# Patient Record
Sex: Male | Born: 1935 | Race: White | Hispanic: No | Marital: Married | Smoking: Former smoker
Health system: Southern US, Community
[De-identification: ages and names within clinical notes are randomized; demographics above are authoritative.]

## PROBLEM LIST (undated history)

## (undated) DIAGNOSIS — E039 Hypothyroidism, unspecified: Secondary | ICD-10-CM

## (undated) DIAGNOSIS — F319 Bipolar disorder, unspecified: Secondary | ICD-10-CM

## (undated) DIAGNOSIS — K824 Cholesterolosis of gallbladder: Secondary | ICD-10-CM

## (undated) DIAGNOSIS — I10 Essential (primary) hypertension: Secondary | ICD-10-CM

## (undated) DIAGNOSIS — D099 Carcinoma in situ, unspecified: Secondary | ICD-10-CM

## (undated) DIAGNOSIS — I2510 Atherosclerotic heart disease of native coronary artery without angina pectoris: Secondary | ICD-10-CM

## (undated) DIAGNOSIS — D0461 Carcinoma in situ of skin of right upper limb, including shoulder: Secondary | ICD-10-CM

## (undated) DIAGNOSIS — K625 Hemorrhage of anus and rectum: Secondary | ICD-10-CM

## (undated) DIAGNOSIS — N281 Cyst of kidney, acquired: Secondary | ICD-10-CM

## (undated) DIAGNOSIS — M47816 Spondylosis without myelopathy or radiculopathy, lumbar region: Secondary | ICD-10-CM

## (undated) DIAGNOSIS — G2581 Restless legs syndrome: Secondary | ICD-10-CM

## (undated) DIAGNOSIS — Z940 Kidney transplant status: Secondary | ICD-10-CM

## (undated) DIAGNOSIS — N400 Enlarged prostate without lower urinary tract symptoms: Secondary | ICD-10-CM

## (undated) DIAGNOSIS — N183 Chronic kidney disease, stage 3 unspecified: Secondary | ICD-10-CM

## (undated) DIAGNOSIS — E785 Hyperlipidemia, unspecified: Secondary | ICD-10-CM

## (undated) DIAGNOSIS — I639 Cerebral infarction, unspecified: Secondary | ICD-10-CM

## (undated) DIAGNOSIS — R1310 Dysphagia, unspecified: Secondary | ICD-10-CM

## (undated) DIAGNOSIS — L219 Seborrheic dermatitis, unspecified: Secondary | ICD-10-CM

## (undated) DIAGNOSIS — R972 Elevated prostate specific antigen [PSA]: Secondary | ICD-10-CM

## (undated) DIAGNOSIS — D649 Anemia, unspecified: Secondary | ICD-10-CM

## (undated) DIAGNOSIS — Z86010 Personal history of colonic polyps: Secondary | ICD-10-CM

## (undated) DIAGNOSIS — K219 Gastro-esophageal reflux disease without esophagitis: Secondary | ICD-10-CM

## (undated) DIAGNOSIS — L908 Other atrophic disorders of skin: Secondary | ICD-10-CM

## (undated) DIAGNOSIS — H02409 Unspecified ptosis of unspecified eyelid: Secondary | ICD-10-CM

## (undated) DIAGNOSIS — M1990 Unspecified osteoarthritis, unspecified site: Secondary | ICD-10-CM

## (undated) DIAGNOSIS — J849 Interstitial pulmonary disease, unspecified: Secondary | ICD-10-CM

## (undated) DIAGNOSIS — C4491 Basal cell carcinoma of skin, unspecified: Secondary | ICD-10-CM

## (undated) HISTORY — DX: Chronic kidney disease, stage 3 (moderate): N18.3

## (undated) HISTORY — DX: Basal cell carcinoma of skin, unspecified: C44.91

## (undated) HISTORY — DX: Benign prostatic hyperplasia without lower urinary tract symptoms: N40.0

## (undated) HISTORY — DX: Bipolar disorder, unspecified: F31.9

## (undated) HISTORY — DX: Elevated prostate specific antigen (PSA): R97.20

## (undated) HISTORY — DX: Gastro-esophageal reflux disease without esophagitis: K21.9

## (undated) HISTORY — DX: Atherosclerotic heart disease of native coronary artery without angina pectoris: I25.10

## (undated) HISTORY — DX: Hyperlipidemia, unspecified: E78.5

## (undated) HISTORY — DX: Anemia, unspecified: D64.9

## (undated) HISTORY — DX: Kidney transplant status: Z94.0

## (undated) HISTORY — DX: Essential (primary) hypertension: I10

## (undated) HISTORY — DX: Dysphagia, unspecified: R13.10

## (undated) HISTORY — DX: Unspecified ptosis of unspecified eyelid: H02.409

## (undated) HISTORY — DX: Chronic kidney disease, stage 3 unspecified: N18.30

## (undated) HISTORY — DX: Cyst of kidney, acquired: N28.1

## (undated) HISTORY — DX: Cerebral infarction, unspecified: I63.9

## (undated) HISTORY — DX: Restless legs syndrome: G25.81

## (undated) HISTORY — DX: Cholesterolosis of gallbladder: K82.4

## (undated) HISTORY — PX: OTHER SURGICAL HISTORY: SHX169

## (undated) HISTORY — DX: Personal history of colonic polyps: Z86.010

## (undated) HISTORY — PX: COLONOSCOPY: SHX174

## (undated) HISTORY — DX: Spondylosis without myelopathy or radiculopathy, lumbar region: M47.816

## (undated) HISTORY — DX: Seborrheic dermatitis, unspecified: L21.9

## (undated) HISTORY — DX: Hemorrhage of anus and rectum: K62.5

---
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
## 2003-12-29 LAB — HM COLONOSCOPY: HM Colonoscopy: NORMAL

## 2004-11-03 ENCOUNTER — Ambulatory Visit: Payer: Self-pay | Admitting: Internal Medicine

## 2004-11-11 ENCOUNTER — Ambulatory Visit: Payer: Self-pay | Admitting: Internal Medicine

## 2005-02-03 ENCOUNTER — Ambulatory Visit: Payer: Self-pay | Admitting: Internal Medicine

## 2005-05-04 ENCOUNTER — Ambulatory Visit: Payer: Self-pay | Admitting: Internal Medicine

## 2005-05-12 ENCOUNTER — Ambulatory Visit (HOSPITAL_COMMUNITY): Admission: RE | Admit: 2005-05-12 | Discharge: 2005-05-12 | Payer: Self-pay | Admitting: Internal Medicine

## 2005-08-07 ENCOUNTER — Ambulatory Visit: Payer: Self-pay | Admitting: Internal Medicine

## 2005-08-12 ENCOUNTER — Ambulatory Visit: Payer: Self-pay | Admitting: Internal Medicine

## 2005-11-18 ENCOUNTER — Ambulatory Visit: Payer: Self-pay | Admitting: Internal Medicine

## 2006-02-03 ENCOUNTER — Ambulatory Visit: Payer: Self-pay | Admitting: Internal Medicine

## 2006-02-09 ENCOUNTER — Ambulatory Visit: Payer: Self-pay | Admitting: Internal Medicine

## 2006-02-24 ENCOUNTER — Ambulatory Visit: Payer: Self-pay | Admitting: Internal Medicine

## 2006-04-14 ENCOUNTER — Ambulatory Visit: Payer: Self-pay | Admitting: Internal Medicine

## 2006-06-25 ENCOUNTER — Ambulatory Visit: Payer: Self-pay | Admitting: Internal Medicine

## 2006-09-23 ENCOUNTER — Ambulatory Visit: Payer: Self-pay | Admitting: Internal Medicine

## 2006-12-15 ENCOUNTER — Ambulatory Visit: Payer: Self-pay | Admitting: Internal Medicine

## 2006-12-15 LAB — CONVERTED CEMR LAB
ALT: 38 units/L (ref 0–40)
AST: 26 units/L (ref 0–37)
Albumin: 3.6 g/dL (ref 3.5–5.2)
Alkaline Phosphatase: 95 units/L (ref 39–117)
BUN: 21 mg/dL (ref 6–23)
Bilirubin, Direct: 0.1 mg/dL (ref 0.0–0.3)
CO2: 25 meq/L (ref 19–32)
Calcium: 9.5 mg/dL (ref 8.4–10.5)
Chloride: 110 meq/L (ref 96–112)
Chol/HDL Ratio, serum: 4.4
Cholesterol: 162 mg/dL (ref 0–200)
Creatinine, Ser: 2.3 mg/dL — ABNORMAL HIGH (ref 0.4–1.5)
GFR calc non Af Amer: 30 mL/min
Glomerular Filtration Rate, Af Am: 37 mL/min/{1.73_m2}
Glucose, Bld: 103 mg/dL — ABNORMAL HIGH (ref 70–99)
HDL: 37.1 mg/dL — ABNORMAL LOW (ref >39.0)
LDL Cholesterol: 99 mg/dL (ref 0–99)
Potassium: 4.3 meq/L (ref 3.5–5.1)
Sodium: 142 meq/L (ref 135–145)
Total Bilirubin: 0.7 mg/dL (ref 0.3–1.2)
Total Protein: 7 g/dL (ref 6.0–8.3)
Triglyceride fasting, serum: 130 mg/dL (ref 0–149)
VLDL: 26 mg/dL (ref 0–40)

## 2006-12-23 ENCOUNTER — Ambulatory Visit: Payer: Self-pay | Admitting: Internal Medicine

## 2007-04-14 ENCOUNTER — Ambulatory Visit: Payer: Self-pay | Admitting: Internal Medicine

## 2007-04-14 LAB — CONVERTED CEMR LAB
ALT: 19 units/L (ref 0–40)
AST: 18 units/L (ref 0–37)
Albumin: 3.8 g/dL (ref 3.5–5.2)
Alkaline Phosphatase: 102 units/L (ref 39–117)
BUN: 22 mg/dL (ref 6–23)
Bilirubin, Direct: 0.1 mg/dL (ref 0.0–0.3)
CO2: 29 meq/L (ref 19–32)
Calcium: 9.7 mg/dL (ref 8.4–10.5)
Chloride: 108 meq/L (ref 96–112)
Cholesterol: 135 mg/dL (ref 0–200)
Creatinine, Ser: 2.5 mg/dL — ABNORMAL HIGH (ref 0.4–1.5)
GFR calc Af Amer: 33 mL/min
GFR calc non Af Amer: 27 mL/min
Glucose, Bld: 86 mg/dL (ref 70–99)
HDL: 33.8 mg/dL — ABNORMAL LOW (ref >39.0)
LDL Cholesterol: 62 mg/dL (ref 0–99)
Potassium: 4.7 meq/L (ref 3.5–5.1)
Sodium: 144 meq/L (ref 135–145)
TSH: 0.46 microintl units/mL (ref 0.35–5.50)
Total Bilirubin: 0.7 mg/dL (ref 0.3–1.2)
Total CHOL/HDL Ratio: 4
Total Protein: 7.2 g/dL (ref 6.0–8.3)
Triglycerides: 196 mg/dL — ABNORMAL HIGH (ref 0–149)
VLDL: 39 mg/dL (ref 0–40)

## 2007-07-07 DIAGNOSIS — M109 Gout, unspecified: Secondary | ICD-10-CM | POA: Insufficient documentation

## 2007-07-07 DIAGNOSIS — K219 Gastro-esophageal reflux disease without esophagitis: Secondary | ICD-10-CM | POA: Insufficient documentation

## 2007-07-07 DIAGNOSIS — I10 Essential (primary) hypertension: Secondary | ICD-10-CM | POA: Insufficient documentation

## 2007-07-07 DIAGNOSIS — E785 Hyperlipidemia, unspecified: Secondary | ICD-10-CM | POA: Insufficient documentation

## 2007-07-27 DIAGNOSIS — F319 Bipolar disorder, unspecified: Secondary | ICD-10-CM | POA: Insufficient documentation

## 2007-08-12 ENCOUNTER — Ambulatory Visit: Payer: Self-pay | Admitting: Internal Medicine

## 2007-08-12 DIAGNOSIS — E039 Hypothyroidism, unspecified: Secondary | ICD-10-CM | POA: Insufficient documentation

## 2007-12-02 ENCOUNTER — Ambulatory Visit: Payer: Self-pay | Admitting: Internal Medicine

## 2007-12-04 LAB — CONVERTED CEMR LAB
BUN: 27 mg/dL — ABNORMAL HIGH (ref 6–23)
CO2: 30 meq/L (ref 19–32)
Calcium: 9.8 mg/dL (ref 8.4–10.5)
Chloride: 103 meq/L (ref 96–112)
Cholesterol: 162 mg/dL (ref 0–200)
Creatinine, Ser: 2.6 mg/dL — ABNORMAL HIGH (ref 0.4–1.5)
GFR calc Af Amer: 32 mL/min
GFR calc non Af Amer: 26 mL/min
Glucose, Bld: 89 mg/dL (ref 70–99)
HDL: 31.4 mg/dL — ABNORMAL LOW (ref >39.0)
LDL Cholesterol: 96 mg/dL (ref 0–99)
PSA: 4.83 ng/mL — ABNORMAL HIGH (ref 0.10–4.00)
Potassium: 3.7 meq/L (ref 3.5–5.1)
Sodium: 143 meq/L (ref 135–145)
TSH: 0.66 microintl units/mL (ref 0.35–5.50)
Total CHOL/HDL Ratio: 5.2
Triglycerides: 171 mg/dL — ABNORMAL HIGH (ref 0–149)
VLDL: 34 mg/dL (ref 0–40)

## 2007-12-09 ENCOUNTER — Ambulatory Visit: Payer: Self-pay | Admitting: Internal Medicine

## 2007-12-09 DIAGNOSIS — R972 Elevated prostate specific antigen [PSA]: Secondary | ICD-10-CM | POA: Insufficient documentation

## 2007-12-19 DIAGNOSIS — J45909 Unspecified asthma, uncomplicated: Secondary | ICD-10-CM | POA: Insufficient documentation

## 2007-12-27 ENCOUNTER — Ambulatory Visit: Payer: Self-pay | Admitting: Family Medicine

## 2007-12-28 ENCOUNTER — Encounter: Payer: Self-pay | Admitting: Internal Medicine

## 2008-01-19 ENCOUNTER — Telehealth (INDEPENDENT_AMBULATORY_CARE_PROVIDER_SITE_OTHER): Payer: Self-pay | Admitting: *Deleted

## 2008-01-19 ENCOUNTER — Encounter: Payer: Self-pay | Admitting: Internal Medicine

## 2008-02-01 ENCOUNTER — Encounter: Payer: Self-pay | Admitting: Internal Medicine

## 2008-03-28 ENCOUNTER — Ambulatory Visit: Payer: Self-pay | Admitting: Internal Medicine

## 2008-03-28 LAB — CONVERTED CEMR LAB
AST: 22 units/L (ref 0–37)
Alkaline Phosphatase: 94 units/L (ref 39–117)
Bilirubin, Direct: 0.2 mg/dL (ref 0.0–0.3)
Creatinine, Ser: 2.7 mg/dL — ABNORMAL HIGH (ref 0.4–1.5)
GFR calc non Af Amer: 25 mL/min
Glucose, Bld: 58 mg/dL — ABNORMAL LOW (ref 70–99)
LDL Cholesterol: 94 mg/dL (ref 0–99)
Sodium: 143 meq/L (ref 135–145)
Total Protein: 6.7 g/dL (ref 6.0–8.3)
Triglycerides: 147 mg/dL (ref 0–149)
VLDL: 29 mg/dL (ref 0–40)

## 2008-04-18 ENCOUNTER — Telehealth (INDEPENDENT_AMBULATORY_CARE_PROVIDER_SITE_OTHER): Payer: Self-pay | Admitting: *Deleted

## 2008-04-18 ENCOUNTER — Ambulatory Visit: Payer: Self-pay | Admitting: Internal Medicine

## 2008-04-18 DIAGNOSIS — N400 Enlarged prostate without lower urinary tract symptoms: Secondary | ICD-10-CM | POA: Insufficient documentation

## 2008-07-20 ENCOUNTER — Ambulatory Visit: Payer: Self-pay | Admitting: Internal Medicine

## 2008-07-20 LAB — CONVERTED CEMR LAB
ALT: 20 units/L (ref 0–53)
AST: 22 units/L (ref 0–37)
BUN: 36 mg/dL — ABNORMAL HIGH (ref 6–23)
Bilirubin, Direct: 0.1 mg/dL (ref 0.0–0.3)
CO2: 28 meq/L (ref 19–32)
Calcium: 9.3 mg/dL (ref 8.4–10.5)
Chloride: 103 meq/L (ref 96–112)
Cholesterol: 146 mg/dL (ref 0–200)
GFR calc Af Amer: 30 mL/min
HDL: 31.7 mg/dL — ABNORMAL LOW (ref >39.0)
Total CHOL/HDL Ratio: 4.6
Total Protein: 7.4 g/dL (ref 6.0–8.3)

## 2008-07-27 ENCOUNTER — Ambulatory Visit: Payer: Self-pay | Admitting: Internal Medicine

## 2008-08-22 ENCOUNTER — Encounter: Payer: Self-pay | Admitting: Internal Medicine

## 2008-12-24 ENCOUNTER — Ambulatory Visit: Payer: Self-pay | Admitting: Internal Medicine

## 2008-12-24 LAB — CONVERTED CEMR LAB
Albumin: 3.6 g/dL (ref 3.5–5.2)
Alkaline Phosphatase: 89 units/L (ref 39–117)
BUN: 32 mg/dL — ABNORMAL HIGH (ref 6–23)
CO2: 29 meq/L (ref 19–32)
Calcium: 9.3 mg/dL (ref 8.4–10.5)
Chloride: 108 meq/L (ref 96–112)
Cholesterol: 144 mg/dL (ref 0–200)
HDL: 35.3 mg/dL — ABNORMAL LOW (ref >39.0)
Hemoglobin: 15.2 g/dL (ref 13.0–17.0)
Lymphocytes Relative: 21.3 % (ref 12.0–46.0)
MCV: 87.8 fL (ref 78.0–100.0)
Monocytes Absolute: 0.7 10*3/uL (ref 0.1–1.0)
Monocytes Relative: 9.6 % (ref 3.0–12.0)
Neutrophils Relative %: 63 % (ref 43.0–77.0)
Potassium: 3.8 meq/L (ref 3.5–5.1)
TSH: 1.05 microintl units/mL (ref 0.35–5.50)
Total Bilirubin: 0.7 mg/dL (ref 0.3–1.2)
Total CHOL/HDL Ratio: 4.1
Total Protein: 6.8 g/dL (ref 6.0–8.3)

## 2008-12-31 ENCOUNTER — Ambulatory Visit: Payer: Self-pay | Admitting: Internal Medicine

## 2009-01-31 ENCOUNTER — Encounter: Payer: Self-pay | Admitting: Internal Medicine

## 2009-02-21 ENCOUNTER — Ambulatory Visit: Payer: Self-pay | Admitting: Internal Medicine

## 2009-03-25 ENCOUNTER — Ambulatory Visit: Payer: Self-pay | Admitting: Internal Medicine

## 2009-03-25 LAB — CONVERTED CEMR LAB
BUN: 32 mg/dL — ABNORMAL HIGH (ref 6–23)
CO2: 28 meq/L (ref 19–32)
Calcium: 9.2 mg/dL (ref 8.4–10.5)
GFR calc non Af Amer: 23.91 mL/min (ref >60)
Glucose, Bld: 86 mg/dL (ref 70–99)
Sodium: 146 meq/L — ABNORMAL HIGH (ref 135–145)

## 2009-04-05 ENCOUNTER — Ambulatory Visit: Payer: Self-pay | Admitting: Internal Medicine

## 2009-07-29 ENCOUNTER — Ambulatory Visit: Payer: Self-pay | Admitting: Internal Medicine

## 2009-07-29 LAB — CONVERTED CEMR LAB
ALT: 19 units/L (ref 0–53)
Alkaline Phosphatase: 87 units/L (ref 39–117)
Cholesterol: 138 mg/dL (ref 0–200)
GFR calc non Af Amer: 21.24 mL/min (ref >60)
Sodium: 147 meq/L — ABNORMAL HIGH (ref 135–145)
TSH: 0.73 microintl units/mL (ref 0.35–5.50)
Total CHOL/HDL Ratio: 4
Total Protein: 7.4 g/dL (ref 6.0–8.3)

## 2009-08-05 ENCOUNTER — Ambulatory Visit: Payer: Self-pay | Admitting: Internal Medicine

## 2009-09-05 ENCOUNTER — Encounter: Payer: Self-pay | Admitting: Internal Medicine

## 2009-12-09 ENCOUNTER — Ambulatory Visit: Payer: Self-pay | Admitting: Internal Medicine

## 2009-12-09 LAB — CONVERTED CEMR LAB
Albumin: 3.9 g/dL (ref 3.5–5.2)
CO2: 29 meq/L (ref 19–32)
Calcium: 9.4 mg/dL (ref 8.4–10.5)
Chloride: 107 meq/L (ref 96–112)
GFR calc non Af Amer: 22.03 mL/min (ref >60)
Potassium: 4.3 meq/L (ref 3.5–5.1)
Sodium: 144 meq/L (ref 135–145)
TSH: 0.95 microintl units/mL (ref 0.35–5.50)

## 2009-12-16 ENCOUNTER — Ambulatory Visit: Payer: Self-pay | Admitting: Internal Medicine

## 2009-12-28 HISTORY — PX: PROSTATE BIOPSY: SHX241

## 2010-02-06 ENCOUNTER — Encounter: Payer: Self-pay | Admitting: Internal Medicine

## 2010-04-13 ENCOUNTER — Emergency Department (HOSPITAL_COMMUNITY): Admission: EM | Admit: 2010-04-13 | Discharge: 2010-04-13 | Payer: Self-pay | Admitting: Family Medicine

## 2010-04-16 ENCOUNTER — Ambulatory Visit: Payer: Self-pay | Admitting: Internal Medicine

## 2010-04-23 ENCOUNTER — Ambulatory Visit: Payer: Self-pay | Admitting: Internal Medicine

## 2010-05-15 ENCOUNTER — Ambulatory Visit: Payer: Self-pay | Admitting: Internal Medicine

## 2010-05-15 LAB — CONVERTED CEMR LAB
ALT: 23 units/L (ref 0–53)
BUN: 32 mg/dL — ABNORMAL HIGH (ref 6–23)
Cholesterol: 170 mg/dL (ref 0–200)
Creatinine, Ser: 2.8 mg/dL — ABNORMAL HIGH (ref 0.4–1.5)
GFR calc non Af Amer: 23.44 mL/min (ref >60)
Glucose, Bld: 118 mg/dL — ABNORMAL HIGH (ref 70–99)
Potassium: 4.1 meq/L (ref 3.5–5.1)
Sodium: 142 meq/L (ref 135–145)
Total CHOL/HDL Ratio: 4
Triglycerides: 148 mg/dL (ref 0.0–149.0)
Uric Acid, Serum: 6.6 mg/dL (ref 4.0–7.8)

## 2010-05-22 ENCOUNTER — Ambulatory Visit: Payer: Self-pay | Admitting: Internal Medicine

## 2010-08-06 ENCOUNTER — Encounter: Payer: Self-pay | Admitting: Internal Medicine

## 2010-09-12 ENCOUNTER — Ambulatory Visit: Payer: Self-pay | Admitting: Internal Medicine

## 2010-09-12 LAB — CONVERTED CEMR LAB
ALT: 19 units/L (ref 0–53)
Basophils Absolute: 0.1 10*3/uL (ref 0.0–0.1)
Basophils Relative: 1 % (ref 0.0–3.0)
Calcium: 9 mg/dL (ref 8.4–10.5)
Eosinophils Absolute: 0.2 10*3/uL (ref 0.0–0.7)
Eosinophils Relative: 2.8 % (ref 0.0–5.0)
GFR calc non Af Amer: 22.24 mL/min (ref >60)
Glucose, Bld: 102 mg/dL — ABNORMAL HIGH (ref 70–99)
HCT: 42.8 % (ref 39.0–52.0)
HDL: 34.7 mg/dL — ABNORMAL LOW (ref >39.00)
Lymphs Abs: 1.8 10*3/uL (ref 0.7–4.0)
MCHC: 33.7 g/dL (ref 30.0–36.0)
MCV: 89 fL (ref 78.0–100.0)
Monocytes Absolute: 0.7 10*3/uL (ref 0.1–1.0)
Monocytes Relative: 10 % (ref 3.0–12.0)
Platelets: 231 10*3/uL (ref 150.0–400.0)
RBC: 4.81 M/uL (ref 4.22–5.81)
Total Bilirubin: 0.4 mg/dL (ref 0.3–1.2)
Triglycerides: 112 mg/dL (ref 0.0–149.0)

## 2010-09-18 ENCOUNTER — Ambulatory Visit: Payer: Self-pay | Admitting: Internal Medicine

## 2010-12-05 ENCOUNTER — Ambulatory Visit: Payer: Self-pay | Admitting: Internal Medicine

## 2010-12-05 LAB — CONVERTED CEMR LAB
CO2: 27 meq/L (ref 19–32)
Cholesterol: 161 mg/dL (ref 0–200)
Creatinine, Ser: 3.1 mg/dL — ABNORMAL HIGH (ref 0.4–1.5)
GFR calc non Af Amer: 21.24 mL/min — ABNORMAL LOW (ref >60.00)
LDL Cholesterol: 98 mg/dL (ref 0–99)
Phosphorus: 4.7 mg/dL — ABNORMAL HIGH (ref 2.3–4.6)
Sodium: 144 meq/L (ref 135–145)
Total Bilirubin: 0.5 mg/dL (ref 0.3–1.2)
Total CHOL/HDL Ratio: 5

## 2010-12-06 ENCOUNTER — Encounter: Payer: Self-pay | Admitting: Internal Medicine

## 2010-12-06 LAB — CONVERTED CEMR LAB: Calcium, Total (PTH): 9.5 mg/dL (ref 8.4–10.5)

## 2010-12-12 ENCOUNTER — Ambulatory Visit: Payer: Self-pay | Admitting: Internal Medicine

## 2011-01-18 ENCOUNTER — Encounter: Payer: Self-pay | Admitting: Internal Medicine

## 2011-01-29 NOTE — Assessment & Plan Note (Signed)
Summary: 4 month fup//ccm   Vital Signs:  Patient profile:   75 year old male Weight:      207 pounds Temp:     98.2 degrees F oral Pulse rate:   80 / minute BP sitting:   120 / 80  (left arm) Cuff size:   regular  Vitals Entered By: Sherron Monday, CMA (AAMA) (September 18, 2010 8:35 AM) CC: Follow-up visit    CC:  Follow-up visit .  History of Present Illness:  Follow-Up Visit      This is a 75 year old man who presents for Follow-up visit.  The patient denies chest pain and palpitations.  Since the last visit the patient notes no new problems or concerns.  The patient reports taking meds as prescribed.  When questioned about possible medication side effects, the patient notes none.   plays tennis regularly He feels great on lithium  All other systems reviewed and were negative   Current Medications (verified): 1)  Allopurinol 100 Mg Tabs (Allopurinol) .... Take 1 Tablet By Mouth Once A Day 2)  Colchicine 0.6 Mg Tabs (Colchicine) .... Take 1 Tablet By Mouth Four Times A Day Prn 3)  Levothyroxine Sodium 25 Mcg Tabs (Levothyroxine Sodium) .... Take 1 Tablet By Mouth Once A Day 4)  Omeprazole 20 Mg Cpdr (Omeprazole) .... Take 1 Capsule By Mouth Once A Day 5)  Simvastatin 40 Mg Tabs (Simvastatin) .... Take 1/2 Tablet By Mouth At Bedtime 6)  Zyprexa 10 Mg Tabs (Olanzapine) .... Take 1/2 Tablet By Mouth At Bedtime 7)  Norvasc 10 Mg  Tabs (Amlodipine Besylate) .... One By Mouth Daiy 8)  Furosemide 40 Mg  Tabs (Furosemide) .... One By Mouth Daily 9)  Calcitriol 0.25 Mcg  Caps (Calcitriol) .... As Directed 10)  Colace 100 Mg Caps (Docusate Sodium) .... Once Daily 11)  Flax Seed Oil 1000 Mg Caps (Flaxseed (Linseed)) .... Once Daily  Allergies (verified): 1)  ! Amantadine Hcl (Amantadine Hcl) 2)  Thorazine  Past History:  Past Medical History: Last updated: 04/18/2008 GERD Hyperlipidemia Hypertension Renal insufficiency Gout bipolar [facility]" normal by pt  report  2007 Hypothyroidism Benign prostatic hypertrophy--prostate bxs-normal  Past Surgical History: Last updated: 07/07/2007 dilation for GERD  Social History: Last updated: 08/12/2007 Married Former Smoker Regular exercise-no  Risk Factors: Exercise: no (08/12/2007)  Risk Factors: Smoking Status: current (05/22/2010)  Physical Exam  General:  alert and well-developed.   Head:  normocephalic and atraumatic.   Eyes:  pupils equal and pupils round.   Neck:  No deformities, masses, or tenderness noted. Chest Wall:  No deformities, masses, tenderness or gynecomastia noted. Lungs:  ctanormal respiratory effort, no intercostal retractions, no accessory muscle use, and no wheezes.   Abdomen:  Bowel sounds positive,abdomen soft and non-tender without masses, organomegaly or hernias noted. Neurologic:  cranial nerves II-XII intact and gait normal.  no tremor   Impression & Recommendations:  Problem # 1:  RENAL DISEASE, CHRONIC, STAGE II (ICD-585.2)  reviewed labs faxed labs to nephrology  Labs Reviewed: BUN: 34 (09/12/2010)   Cr: 3.0 (09/12/2010)    Hgb: 14.4 (09/12/2010)   Hct: 42.8 (09/12/2010)   Ca++: 9.0 (09/12/2010)    TP: 6.4 (09/12/2010)   Alb: 3.9 (09/12/2010)  Problem # 2:  HYPOTHYROIDISM (ICD-244.9) controlled previously will need f/u His updated medication list for this problem includes:    Levothyroxine Sodium 25 Mcg Tabs (Levothyroxine sodium) .Marland Kitchen... Take 1 tablet by mouth once a day  Labs  Reviewed: TSH: 0.95 (12/09/2009)    Chol: 140 (09/12/2010)   HDL: 34.70 (09/12/2010)   LDL: 83 (09/12/2010)   TG: 112.0 (09/12/2010)  Problem # 3:  DISORDER, BIPOLAR NOS (ICD-296.80) doing great on zyprexa  Problem # 4:  HYPERTENSION (ICD-401.9) controlled continue current medications  His updated medication list for this problem includes:    Norvasc 10 Mg Tabs (Amlodipine besylate) ..... One by mouth daiy    Furosemide 40 Mg Tabs (Furosemide) ..... One by mouth  daily  BP today: 120/80 Prior BP: 118/80 (05/22/2010)  Labs Reviewed: K+: 3.7 (09/12/2010) Creat: : 3.0 (09/12/2010)   Chol: 140 (09/12/2010)   HDL: 34.70 (09/12/2010)   LDL: 83 (09/12/2010)   TG: 112.0 (09/12/2010)  Complete Medication List: 1)  Allopurinol 100 Mg Tabs (Allopurinol) .... Take one tablet by mouth every other day 2)  Colchicine 0.6 Mg Tabs (Colchicine) .... Take 1 tablet by mouth four times a day prn 3)  Levothyroxine Sodium 25 Mcg Tabs (Levothyroxine sodium) .... Take 1 tablet by mouth once a day 4)  Omeprazole 20 Mg Cpdr (Omeprazole) .... Take one tablet by mouth every other day 5)  Simvastatin 40 Mg Tabs (Simvastatin) .... Take 1/2 tablet by mouth at bedtime 6)  Zyprexa 10 Mg Tabs (Olanzapine) .... Take 1/2 tablet by mouth at bedtime 7)  Norvasc 10 Mg Tabs (Amlodipine besylate) .... One by mouth daiy 8)  Furosemide 40 Mg Tabs (Furosemide) .... One by mouth daily 9)  Calcitriol 0.25 Mcg Caps (Calcitriol) .... As directed 10)  Colace 100 Mg Caps (Docusate sodium) .... Once daily 11)  Flax Seed Oil 1000 Mg Caps (Flaxseed (linseed)) .... Once daily  Other Orders: Admin 1st Vaccine [phone]) Flu Vaccine 93yrs + [id number])  Patient Instructions: 1)  Please schedule a follow-up appointment in 3 months. 2)  bmet--995.2 3)  tsh--244.9 4)  lipids 272.4 5)  liver 995.2 Flu Vaccine Consent Questions     Do you have a history of severe allergic reactions to this vaccine? no    Any prior history of allergic reactions to egg and/or gelatin? no    Do you have a sensitivity to the preservative Thimersol? no    Do you have a past history of Guillan-Barre Syndrome? no    Do you currently have an acute febrile illness? no    Have you ever had a severe reaction to latex? no    Vaccine information given and explained to patient? yes    Are you currently pregnant? no    Lot Number:[id number]   Exp Date:06/27/2011   Site Given  Left Deltoid IMlu Sherron Monday, CMA (AAMA)   September 18, 2010 8:36 AM

## 2011-01-29 NOTE — Letter (Signed)
Summary: [facility]   Imported By: Laural Benes 12/24/2010 13:17:32  _____________________________________________________________________  External Attachment:    Type:   Image     Comment:   External Document

## 2011-01-29 NOTE — Letter (Signed)
Summary: [facility]   Imported By: Laural Benes 02/24/2010 15:21:11  _____________________________________________________________________  External Attachment:    Type:   Image     Comment:   External Document

## 2011-01-29 NOTE — Assessment & Plan Note (Signed)
Summary: 3 month rov/njr   Vital Signs:  Patient profile:   75 year old male Height:      70 inches Weight:      207 pounds BMI:     29.81 Temp:     99.1 degrees F oral Pulse rate:   90 / minute BP sitting:   118 / 70  (left arm) Cuff size:   large  Vitals Entered By: Townsend Roger, CMA (December 12, 2010 8:50 AM) CC: f/u on lab results   CC:  f/u on lab results.  History of Present Illness:  Follow-Up Visit      This is a 75 year old man who presents for Follow-up visit.  The patient denies chest pain and palpitations.  Since the last visit the patient notes no new problems or concerns and being seen by a specialist.  The patient reports taking meds as prescribed.  When questioned about possible medication side effects, the patient notes none.    All other systems reviewed and were negative --concerned with expired colchicine---would like new Rx  Current Problems (verified): 1)  Renal Disease, Chronic, Stage II  (ICD-585.2) 2)  Benign Prostatic Hypertrophy  (ICD-600.00) 3)  Extrinsic Asthma, Unspecified  (ICD-493.00) 4)  Psa, Increased  (ICD-790.93) 5)  Hypothyroidism  (ICD-244.9) 6)  Disorder, Bipolar Nos  (ICD-296.80) 7)  Gout  (ICD-274.9) 8)  Hypertension  (ICD-401.9) 9)  Hyperlipidemia  (ICD-272.4) 10)  Gerd  (ICD-530.81)  Current Medications (verified): 1)  Allopurinol 100 Mg Tabs (Allopurinol) .... Take One Tablet By Mouth Every Other Day 2)  Colchicine 0.6 Mg Tabs (Colchicine) .... Take 1 Tablet By Mouth Four Times A Day Prn 3)  Levothyroxine Sodium 25 Mcg Tabs (Levothyroxine Sodium) .... Take 1 Tablet By Mouth Once A Day 4)  Omeprazole 20 Mg Cpdr (Omeprazole) .... Take One Tablet By Mouth Every Other Day 5)  Simvastatin 40 Mg Tabs (Simvastatin) .... Take 1/2 Tablet By Mouth At Bedtime 6)  Zyprexa 10 Mg Tabs (Olanzapine) .... Take 1/2 Tablet By Mouth At Bedtime 7)  Norvasc 10 Mg  Tabs (Amlodipine Besylate) .... One By Mouth Daiy 8)  Furosemide 40 Mg  Tabs  (Furosemide) .... One By Mouth Daily 9)  Calcitriol 0.25 Mcg  Caps (Calcitriol) .... As Directed 10)  Flax Seed Oil 1000 Mg Caps (Flaxseed (Linseed)) .... Once Daily  Allergies (verified): 1)  ! Amantadine Hcl (Amantadine Hcl) 2)  Thorazine  Past History:  Past Medical History: Last updated: 04/18/2008 GERD Hyperlipidemia Hypertension Renal insufficiency Gout bipolar "[facility]" normal by pt report  2007 Hypothyroidism Benign prostatic hypertrophy--prostate bxs-normal  Past Surgical History: Last updated: 07/07/2007 dilation for GERD  Social History: Last updated: 08/12/2007 Married Former Smoker Regular exercise-no  Risk Factors: Exercise: no (08/12/2007)  Risk Factors: Smoking Status: current (05/22/2010)  Physical Exam  General:  well-developed well-nourished male in no acute distress. HEENT exam atraumatic, normocephalic symmetric her muscles are intact. Neck is supple. Chest clear to auscultation. Cardiac exam S1-S2 are regular. Extremities no edema. Abdominal exam active bowel sounds, soft. He does have a left  ear cerumen impaction.   Impression & Recommendations:  Problem # 1:  RENAL DISEASE, CHRONIC, STAGE II (ICD-585.2) no real symptoms. Creatinine is stable. He has regular followup with nephrology. Labs Reviewed: BUN: 40 (12/05/2010)   Cr: 3.1 (12/05/2010)    Hgb: 14.4 (09/12/2010)   Hct: 42.8 (09/12/2010)   Ca++: 9.5 (12/05/2010)   Phos: 4.7 (12/05/2010) TP: 7.0 (12/05/2010)   Alb: 4.0 (  12/05/2010)  Problem # 2:  HYPOTHYROIDISM (ICD-244.9)  His updated medication list for this problem includes:    Levothyroxine Sodium 25 Mcg Tabs (Levothyroxine sodium) .Marland Kitchen... Take 1 tablet by mouth once a day  Labs Reviewed: TSH: 0.98 (12/05/2010)    Chol: 161 (12/05/2010)   HDL: 35.50 (12/05/2010)   LDL: 98 (12/05/2010)   TG: 136.0 (12/05/2010)  Problem # 3:  HYPERLIPIDEMIA (ICD-272.4) controlled. Continue current medications. His updated medication  list for this problem includes:    Simvastatin 40 Mg Tabs (Simvastatin) .Marland Kitchen... Take 1/2 tablet by mouth at bedtime  Labs Reviewed: SGOT: 19 (12/05/2010)   SGPT: 17 (12/05/2010)   HDL:35.50 (12/05/2010), 34.70 (09/12/2010)  LDL:98 (12/05/2010), 83 (09/12/2010)  Chol:161 (12/05/2010), 140 (09/12/2010)  Trig:136.0 (12/05/2010), 112.0 (09/12/2010)  Problem # 4:  GOUT (ICD-274.9) no recurrence. His updated medication list for this problem includes:    Allopurinol 100 Mg Tabs (Allopurinol) .Marland Kitchen... Take one tablet by mouth every other day    Colchicine 0.6 Mg Tabs (Colchicine) .Marland Kitchen... Take 1 tablet by mouth four times a day prn  Complete Medication List: 1)  Allopurinol 100 Mg Tabs (Allopurinol) .... Take one tablet by mouth every other day 2)  Colchicine 0.6 Mg Tabs (Colchicine) .... Take 1 tablet by mouth four times a day prn 3)  Levothyroxine Sodium 25 Mcg Tabs (Levothyroxine sodium) .... Take 1 tablet by mouth once a day 4)  Omeprazole 20 Mg Cpdr (Omeprazole) .... Take one tablet by mouth every other day 5)  Simvastatin 40 Mg Tabs (Simvastatin) .... Take 1/2 tablet by mouth at bedtime 6)  Zyprexa 10 Mg Tabs (Olanzapine) .... Take 1/2 tablet by mouth at bedtime 7)  Norvasc 10 Mg Tabs (Amlodipine besylate) .... One by mouth daiy 8)  Furosemide 40 Mg Tabs (Furosemide) .... One by mouth daily 9)  Calcitriol 0.25 Mcg Caps (Calcitriol) .... As directed 10)  Flax Seed Oil 1000 Mg Caps (Flaxseed (linseed)) .... Once daily  Other Orders: Cerumen Impaction Removal [id number])  Patient Instructions: 1)  Please schedule a follow-up appointment in 4 months.   Orders Added: 1)  Est. Patient Level IV [id number] 2)  Cerumen Impaction Removal [id number]

## 2011-01-29 NOTE — Assessment & Plan Note (Signed)
Summary: 1 wk rov/mm   Vital Signs:  Patient profile:   75 year old male Weight:      199 pounds Temp:     98.6 degrees F oral Pulse rate:   96 / minute Pulse rhythm:   irregular Resp:     16 per minute BP sitting:   116 / 74  (left arm) Cuff size:   regular  Vitals Entered By: Rica Records, RN (April 23, 2010 9:09 AM) CC: 1 week rov--states better--stopped amantadine due to itching and resolved when off Is Patient Diabetic? No   CC:  1 week rov--states better--stopped amantadine due to itching and resolved when off.  History of Present Illness: f/u bronchitis-- he feels much better played tennis yesterday and felt well no fever or chills  no side effects on meds  All other systems reviewed and were negative   Preventive Screening-Counseling & Management  Alcohol-Tobacco     Smoking Status: current     Year Started: 1960     Year Quit: 1975  Current Medications (verified): 1)  Allopurinol 100 Mg Tabs (Allopurinol) .... Take 1 Tablet By Mouth Once A Day 2)  Colchicine 0.6 Mg Tabs (Colchicine) .... Take 1 Tablet By Mouth Four Times A Day Prn 3)  Levothyroxine Sodium 25 Mcg Tabs (Levothyroxine Sodium) .... Take 1 Tablet By Mouth Once A Day 4)  Omeprazole 20 Mg Cpdr (Omeprazole) .... Take 1 Capsule By Mouth Once A Day 5)  Simvastatin 40 Mg Tabs (Simvastatin) .... Take 1/2 Tablet By Mouth At Bedtime 6)  Zyprexa 10 Mg Tabs (Olanzapine) .... Take 1/2 Tablet By Mouth At Bedtime 7)  Norvasc 10 Mg  Tabs (Amlodipine Besylate) .... One By Mouth Daiy 8)  Furosemide 40 Mg  Tabs (Furosemide) .... One By Mouth Daily 9)  Calcitriol 0.25 Mcg  Caps (Calcitriol) .... As Directed 10)  Dulcolax 5 Mg Tbec (Bisacodyl) .... Once Daily 11)  Flax Seed Oil 1000 Mg Caps (Flaxseed (Linseed)) .... Once Daily  Allergies: 1)  ! Amantadine Hcl (Amantadine Hcl) 2)  Thorazine  Past History:  Past Medical History: Last updated: 04/18/2008 GERD Hyperlipidemia Hypertension Renal  insufficiency Gout bipolar "[facility]" normal by pt report  2007 Hypothyroidism Benign prostatic hypertrophy--prostate bxs-normal  Past Surgical History: Last updated: 07/07/2007 dilation for GERD  Social History: Last updated: 08/12/2007 Married Former Smoker Regular exercise-no  Risk Factors: Exercise: no (08/12/2007)  Risk Factors: Smoking Status: current (04/23/2010)  Review of Systems       All other systems reviewed and were negative   Physical Exam  General:  alert and well-developed.   Head:  normocephalic and atraumatic.   Eyes:  pupils equal and pupils round.   Ears:  R ear normal and L ear normal.   Neck:  No deformities, masses, or tenderness noted. Chest Wall:  No deformities, masses, tenderness or gynecomastia noted. Lungs:  ctanormal respiratory effort, no intercostal retractions, no accessory muscle use, and no wheezes.   Heart:  normal rate and regular rhythm.   Abdomen:  Bowel sounds positive,abdomen soft and non-tender without masses, organomegaly or hernias noted.   Impression & Recommendations:  Problem # 1:  BRONCHITIS-ACUTE (ICD-466.0) remarkably better no need for any furhter treatment call for any concerns  Complete Medication List: 1)  Allopurinol 100 Mg Tabs (Allopurinol) .... Take 1 tablet by mouth once a day 2)  Colchicine 0.6 Mg Tabs (Colchicine) .... Take 1 tablet by mouth four times a day prn 3)  Levothyroxine Sodium  25 Mcg Tabs (Levothyroxine sodium) .... Take 1 tablet by mouth once a day 4)  Omeprazole 20 Mg Cpdr (Omeprazole) .... Take 1 capsule by mouth once a day 5)  Simvastatin 40 Mg Tabs (Simvastatin) .... Take 1/2 tablet by mouth at bedtime 6)  Zyprexa 10 Mg Tabs (Olanzapine) .... Take 1/2 tablet by mouth at bedtime 7)  Norvasc 10 Mg Tabs (Amlodipine besylate) .... One by mouth daiy 8)  Furosemide 40 Mg Tabs (Furosemide) .... One by mouth daily 9)  Calcitriol 0.25 Mcg Caps (Calcitriol) .... As directed 10)   Dulcolax 5 Mg Tbec (Bisacodyl) .... Once daily 11)  Flax Seed Oil 1000 Mg Caps (Flaxseed (linseed)) .... Once daily

## 2011-01-29 NOTE — Assessment & Plan Note (Signed)
Summary: 4 MNTH ROV//SLM/pt rescd//ccm   Vital Signs:  Patient profile:   75 year old male Weight:      202 pounds Temp:     97.6 degrees F oral Pulse rate:   84 / minute Pulse rhythm:   irregular Resp:     12 per minute BP sitting:   118 / 80  (left arm) Cuff size:   regular  Vitals Entered By: Rica Records, RN (May 22, 2010 8:55 AM) CC: 4 month rov, labs done Is Patient Diabetic? No   CC:  4 month rov and labs done.  History of Present Illness:  Follow-Up Visit      This is a 75 year old man who presents for Follow-up visit.  The patient denies chest pain and palpitations.  Since the last visit the patient notes no new problems or concerns.  The patient reports taking meds as prescribed.  When questioned about possible medication side effects, the patient notes none.    All other systems reviewed and were negative   Preventive Screening-Counseling & Management  Alcohol-Tobacco     Smoking Status: current     Year Started: 1960     Year Quit: 1975  Current Problems (verified): 1)  Renal Disease, Chronic, Stage II  (ICD-585.2) 2)  Benign Prostatic Hypertrophy  (ICD-600.00) 3)  Extrinsic Asthma, Unspecified  (ICD-493.00) 4)  Psa, Increased  (ICD-790.93) 5)  Hypothyroidism  (ICD-244.9) 6)  Disorder, Bipolar Nos  (ICD-296.80) 7)  Gout  (ICD-274.9) 8)  Hypertension  (ICD-401.9) 9)  Hyperlipidemia  (ICD-272.4) 10)  Gerd  (ICD-530.81)  Current Medications (verified): 1)  Allopurinol 100 Mg Tabs (Allopurinol) .... Take 1 Tablet By Mouth Once A Day 2)  Colchicine 0.6 Mg Tabs (Colchicine) .... Take 1 Tablet By Mouth Four Times A Day Prn 3)  Levothyroxine Sodium 25 Mcg Tabs (Levothyroxine Sodium) .... Take 1 Tablet By Mouth Once A Day 4)  Omeprazole 20 Mg Cpdr (Omeprazole) .... Take 1 Capsule By Mouth Once A Day 5)  Simvastatin 40 Mg Tabs (Simvastatin) .... Take 1/2 Tablet By Mouth At Bedtime 6)  Zyprexa 10 Mg Tabs (Olanzapine) .... Take 1/2 Tablet By Mouth At Bedtime 7)   Norvasc 10 Mg  Tabs (Amlodipine Besylate) .... One By Mouth Daiy 8)  Furosemide 40 Mg  Tabs (Furosemide) .... One By Mouth Daily 9)  Calcitriol 0.25 Mcg  Caps (Calcitriol) .... As Directed 10)  Colace 100 Mg Caps (Docusate Sodium) .... Once Daily 11)  Flax Seed Oil 1000 Mg Caps (Flaxseed (Linseed)) .... Once Daily  Allergies: 1)  ! Amantadine Hcl (Amantadine Hcl) 2)  Thorazine  Past History:  Past Medical History: Last updated: 04/18/2008 GERD Hyperlipidemia Hypertension Renal insufficiency Gout bipolar "[facility]" normal by pt report  2007 Hypothyroidism Benign prostatic hypertrophy--prostate bxs-normal  Past Surgical History: Last updated: 07/07/2007 dilation for GERD  Social History: Last updated: 08/12/2007 Married Former Smoker Regular exercise-no  Risk Factors: Exercise: no (08/12/2007)  Risk Factors: Smoking Status: current (05/22/2010)  Review of Systems       All other systems reviewed and were negative   Physical Exam  General:  alert and well-developed.   Head:  normocephalic and atraumatic.   Eyes:  pupils equal and pupils round.   Ears:  R ear normal and L ear normal.   Neck:  No deformities, masses, or tenderness noted. Chest Wall:  No deformities, masses, tenderness or gynecomastia noted. Lungs:  ctanormal respiratory effort, no intercostal retractions, no accessory muscle  use, and no wheezes.   Heart:  normal rate and regular rhythm.   Abdomen:  Bowel sounds positive,abdomen soft and non-tender without masses, organomegaly or hernias noted. Msk:  normal ROM and no joint tenderness.   Pulses:  R radial normal and L radial normal.   Neurologic:  cranial nerves II-XII intact and gait normal.     Impression & Recommendations:  Problem # 1:  RENAL DISEASE, CHRONIC, STAGE II (ICD-585.2) has regular f/u with nephrology  Problem # 2:  HYPOTHYROIDISM (ICD-244.9) controlled continue current medications  His updated medication list for  this problem includes:    Levothyroxine Sodium 25 Mcg Tabs (Levothyroxine sodium) .Marland Kitchen... Take 1 tablet by mouth once a day  Labs Reviewed: TSH: 0.95 (12/09/2009)    Chol: 170 (05/15/2010)   HDL: 46.00 (05/15/2010)   LDL: 94 (05/15/2010)   TG: 148.0 (05/15/2010)  Problem # 3:  HYPERLIPIDEMIA (ICD-272.4) well controlled continue current medications  His updated medication list for this problem includes:    Simvastatin 40 Mg Tabs (Simvastatin) .Marland Kitchen... Take 1/2 tablet by mouth at bedtime  Labs Reviewed: SGOT: 20 (05/15/2010)   SGPT: 23 (05/15/2010)   HDL:46.00 (05/15/2010), 34.50 (12/09/2009)  LDL:94 (05/15/2010), 90 (12/09/2009)  Chol:170 (05/15/2010), 159 (12/09/2009)  Trig:148.0 (05/15/2010), 175.0 (12/09/2009)  Problem # 4:  DISORDER, BIPOLAR NOS (ICD-296.80) he is doing well clinically has regular f/u with psych  Complete Medication List: 1)  Allopurinol 100 Mg Tabs (Allopurinol) .... Take 1 tablet by mouth once a day 2)  Colchicine 0.6 Mg Tabs (Colchicine) .... Take 1 tablet by mouth four times a day prn 3)  Levothyroxine Sodium 25 Mcg Tabs (Levothyroxine sodium) .... Take 1 tablet by mouth once a day 4)  Omeprazole 20 Mg Cpdr (Omeprazole) .... Take 1 capsule by mouth once a day 5)  Simvastatin 40 Mg Tabs (Simvastatin) .... Take 1/2 tablet by mouth at bedtime 6)  Zyprexa 10 Mg Tabs (Olanzapine) .... Take 1/2 tablet by mouth at bedtime 7)  Norvasc 10 Mg Tabs (Amlodipine besylate) .... One by mouth daiy 8)  Furosemide 40 Mg Tabs (Furosemide) .... One by mouth daily 9)  Calcitriol 0.25 Mcg Caps (Calcitriol) .... As directed 10)  Colace 100 Mg Caps (Docusate sodium) .... Once daily 11)  Flax Seed Oil 1000 Mg Caps (Flaxseed (linseed)) .... Once daily  Patient Instructions: 1)  Please schedule a follow-up appointment in 4 months. 2)  lipids 272.4 3)  liver 995.2 4)  bmet 5)  cbc

## 2011-01-29 NOTE — Assessment & Plan Note (Signed)
Summary: COUGH, CONGESTION // RS   Vital Signs:  Patient profile:   75 year old male Weight:      200 pounds Temp:     98.6 degrees F oral Pulse rate:   94 / minute Pulse rhythm:   skip x1 Resp:     14 per minute BP sitting:   102 / 68  (left arm) Cuff size:   regular  Vitals Entered By: Rica Records, RN (April 16, 2010 11:51 AM) CC: c/o cough and congestion--went to urgent care on 4/17 and given z pack 250mg a nd tussionex Is Patient Diabetic? No   CC:  c/o cough and congestion--went to urgent care on 4/17 and given z pack 250mg a nd tussionex.  History of Present Illness: URI sxs for 5 days croupy/dry cough went to [facility] 3 days ago and treated with zpack and cough med cough is nonproductive no fever or chills, sweats  All other systems reviewed and were negative   Preventive Screening-Counseling & Management  Alcohol-Tobacco     Smoking Status: current     Year Quit: 1975  Comments: pipes and cigars only  Current Problems (verified): 1)  Renal Disease, Chronic, Stage II  (ICD-585.2) 2)  Benign Prostatic Hypertrophy  (ICD-600.00) 3)  Extrinsic Asthma, Unspecified  (ICD-493.00) 4)  Psa, Increased  (ICD-790.93) 5)  Hypothyroidism  (ICD-244.9) 6)  Disorder, Bipolar Nos  (ICD-296.80) 7)  Gout  (ICD-274.9) 8)  Hypertension  (ICD-401.9) 9)  Hyperlipidemia  (ICD-272.4) 10)  Gerd  (ICD-530.81)  Current Medications (verified): 1)  Allopurinol 100 Mg Tabs (Allopurinol) .... Take 1 Tablet By Mouth Once A Day 2)  Colchicine 0.6 Mg Tabs (Colchicine) .... Take 1 Tablet By Mouth Four Times A Day Prn 3)  Levothyroxine Sodium 25 Mcg Tabs (Levothyroxine Sodium) .... Take 1 Tablet By Mouth Once A Day 4)  Omeprazole 20 Mg Cpdr (Omeprazole) .... Take 1 Capsule By Mouth Once A Day 5)  Simvastatin 40 Mg Tabs (Simvastatin) .... Take 1/2 Tablet By Mouth At Bedtime 6)  Zyprexa 10 Mg Tabs (Olanzapine) .... Take 1/2 Tablet By Mouth At Bedtime 7)  Norvasc 10 Mg  Tabs (Amlodipine Besylate)  .... One By Mouth Daiy 8)  Furosemide 40 Mg  Tabs (Furosemide) .... One By Mouth Daily 9)  Calcitriol 0.25 Mcg  Caps (Calcitriol) .... As Directed 10)  Amantadine Hcl 100 Mg  Tabs (Amantadine Hcl) .... Take 1 Tablet By Mouth Once A Day 11)  Dulcolax 5 Mg Tbec (Bisacodyl) .... Once Daily 12)  Flax Seed Oil 1000 Mg Caps (Flaxseed (Linseed)) .... Once Daily 13)  Tussionex Pennkinetic Er 8-10 Mg/10ml Lqcr (Chlorpheniramine-Hydrocodone) .... One Teaspoon Every 12 Hours As Needed Cough 14)  Zithromax Z-Pak 250 Mg Tabs (Azithromycin)  Allergies (verified): 1)  Thorazine  Social History: Smoking Status:  current  Physical Exam  General:  alert and well-developed.   Head:  normocephalic and atraumatic.   Eyes:  pupils equal and pupils round.   Ears:  R ear normal and L ear normal.   Neck:  No deformities, masses, or tenderness noted. Chest Wall:  No deformities, masses, tenderness or gynecomastia noted. Lungs:  normal respiratory effort and no intercostal retractions.  bilateral wheeze and rhonchi Heart:  normal rate and regular rhythm.   Abdomen:  Bowel sounds positive,abdomen soft and non-tender without masses, organomegaly or hernias noted. Msk:  No deformity or scoliosis noted of thoracic or lumbar spine.   Neurologic:  cranial nerves II-XII intact and gait normal.  Impression & Recommendations:  Problem # 1:  BRONCHITIS-ACUTE (ICD-466.0)  His updated medication list for this problem includes:    Tussionex Pennkinetic Er 8-10 Mg/39ml Lqcr (Chlorpheniramine-hydrocodone) ..... One teaspoon every 12 hours as needed cough    Zithromax Z-pak 250 Mg Tabs (Azithromycin)    Proair Hfa 108 (90 Base) Mcg/act Aers (Albuterol sulfate) .Marland Kitchen... 2 puffs four times a day as needed cough or wheeze  complete abx I have some concern with pulmonary exa---wheezing and rhonchi I will add prednisone and albuterol see me next week  Problem # 2:  RENAL DISEASE, CHRONIC, STAGE II (ICD-585.2)  Labs  Reviewed: BUN: 35 (12/09/2009)   Cr: 3.0 (12/09/2009)    Hgb: 15.2 (12/24/2008)   Hct: 44.0 (12/24/2008)   Ca++: 9.4 (12/09/2009)    TP: 6.9 (12/09/2009)   Alb: 3.9 (12/09/2009)  Problem # 3:  HYPERTENSION (ICD-401.9)  His updated medication list for this problem includes:    Norvasc 10 Mg Tabs (Amlodipine besylate) ..... One by mouth daiy    Furosemide 40 Mg Tabs (Furosemide) ..... One by mouth daily  Prior BP: 126/80 (12/16/2009)  Labs Reviewed: K+: 4.3 (12/09/2009) Creat: : 3.0 (12/09/2009)   Chol: 159 (12/09/2009)   HDL: 34.50 (12/09/2009)   LDL: 90 (12/09/2009)   TG: 175.0 (12/09/2009)  Problem # 4:  HYPERLIPIDEMIA (ICD-272.4)  His updated medication list for this problem includes:    Simvastatin 40 Mg Tabs (Simvastatin) .Marland Kitchen... Take 1/2 tablet by mouth at bedtime  Labs Reviewed: SGOT: 20 (12/09/2009)   SGPT: 18 (12/09/2009)   HDL:34.50 (12/09/2009), 35.80 (07/29/2009)  LDL:90 (12/09/2009), 86 (07/29/2009)  Chol:159 (12/09/2009), 138 (07/29/2009)  Trig:175.0 (12/09/2009), 82.0 (07/29/2009)  Complete Medication List: 1)  Allopurinol 100 Mg Tabs (Allopurinol) .... Take 1 tablet by mouth once a day 2)  Colchicine 0.6 Mg Tabs (Colchicine) .... Take 1 tablet by mouth four times a day prn 3)  Levothyroxine Sodium 25 Mcg Tabs (Levothyroxine sodium) .... Take 1 tablet by mouth once a day 4)  Omeprazole 20 Mg Cpdr (Omeprazole) .... Take 1 capsule by mouth once a day 5)  Simvastatin 40 Mg Tabs (Simvastatin) .... Take 1/2 tablet by mouth at bedtime 6)  Zyprexa 10 Mg Tabs (Olanzapine) .... Take 1/2 tablet by mouth at bedtime 7)  Norvasc 10 Mg Tabs (Amlodipine besylate) .... One by mouth daiy 8)  Furosemide 40 Mg Tabs (Furosemide) .... One by mouth daily 9)  Calcitriol 0.25 Mcg Caps (Calcitriol) .... As directed 10)  Amantadine Hcl 100 Mg Tabs (Amantadine hcl) .... Take 1 tablet by mouth once a day 11)  Dulcolax 5 Mg Tbec (Bisacodyl) .... Once daily 12)  Flax Seed Oil 1000 Mg Caps  (Flaxseed (linseed)) .... Once daily 13)  Tussionex Pennkinetic Er 8-10 Mg/39ml Lqcr (Chlorpheniramine-hydrocodone) .... One teaspoon every 12 hours as needed cough 14)  Zithromax Z-pak 250 Mg Tabs (Azithromycin) 15)  Prednisone 10 Mg Tabs (Prednisone) .... 4 by mouth once daily for two days, than 2 by mouth once daily for 2 days than 1 by mouth once daily for two days 16)  Proair Hfa 108 (90 Base) Mcg/act Aers (Albuterol sulfate) .... 2 puffs four times a day as needed cough or wheeze  Patient Instructions: 1)  see me next week Prescriptions: PROAIR HFA 108 (90 BASE) MCG/ACT  AERS (ALBUTEROL SULFATE) 2 puffs four times a day as needed cough or wheeze  #1 x 0   Entered and Authorized by:   Phoebe Sharps MD   Signed by:   Darnell Level  Franny Selvage MD on 04/16/2010   Method used:   Electronically to        [facility]  [phone]* (retail)       [place], [place]  [address]       Ph: [phone] or [phone]       Fax: [phone]   RxID:   [id number] PREDNISONE 10 MG  TABS (PREDNISONE) 4 by mouth once daily for two days, than 2 by mouth once daily for 2 days than 1 by mouth once daily for two days  #15 x 0   Entered and Authorized by:   Phoebe Sharps MD   Signed by:   Phoebe Sharps MD on 04/16/2010   Method used:   Electronically to        [facility]  [phone]* (retail)       [address]       [place], [place]  [address]       Ph: [phone] or [phone]       Fax: [phone]   RxID:   [id number]

## 2011-01-29 NOTE — Letter (Signed)
Summary: [facility]   Imported By: Laural Benes 08/28/2010 11:30:46  _____________________________________________________________________  External Attachment:    Type:   Image     Comment:   External Document

## 2011-01-29 NOTE — Consult Note (Signed)
Summary: [facility]  [facility]   Imported By: Laural Benes 03/28/2008 [id number]  _____________________________________________________________________  External Attachment:    Type:   Image     Comment:   External Document

## 2011-04-08 ENCOUNTER — Encounter: Payer: Self-pay | Admitting: Internal Medicine

## 2011-04-10 ENCOUNTER — Ambulatory Visit: Payer: Self-pay | Admitting: Internal Medicine

## 2011-05-07 ENCOUNTER — Encounter: Payer: Self-pay | Admitting: Internal Medicine

## 2011-05-12 ENCOUNTER — Ambulatory Visit (INDEPENDENT_AMBULATORY_CARE_PROVIDER_SITE_OTHER): Payer: Medicare Other | Admitting: Internal Medicine

## 2011-05-12 ENCOUNTER — Encounter: Payer: Self-pay | Admitting: Internal Medicine

## 2011-05-12 DIAGNOSIS — I10 Essential (primary) hypertension: Secondary | ICD-10-CM

## 2011-05-12 DIAGNOSIS — E785 Hyperlipidemia, unspecified: Secondary | ICD-10-CM

## 2011-05-12 NOTE — Assessment & Plan Note (Signed)
Controlled Continue same meds 

## 2011-05-12 NOTE — Assessment & Plan Note (Signed)
Previously controlled No need for labs today

## 2011-05-12 NOTE — Progress Notes (Signed)
  Subjective:    Patient ID: Curtis Jimenez, male    DOB: [birth year], 75 y.o.   MRN: [id number]  HPI  Bipolar---has required change meds due to weight gain--he does not like new medication  Hypothyroid--tolerating replacement  Renal failure---has regular f/u with nephrology (underlying cause: htn +/- lithium)  Lipids-tolerating meds  Past Medical History  Diagnosis Date  . GERD (gastroesophageal reflux disease)   . Hyperlipidemia   . Hypertension   . Renal insufficiency   . Gout   . Bipolar disorder   . Thyroid disease     hypothyroidism  . BPH (benign prostatic hyperplasia)    Past Surgical History  Procedure Date  . Dilation for gerd     reports that he quit smoking about 35 years ago. He does not have any smokeless tobacco history on file. His alcohol and drug histories not on file. family history is not on file. Allergies  Allergen Reactions  . Amantadine Hcl     REACTION: itching  . Chlorpromazine Hcl     REACTION: "fell flat on face"    Review of Systems  patient denies chest pain, shortness of breath, orthopnea. Denies lower extremity edema, abdominal pain, , change in bowel movements. Patient denies rashes, musculoskeletal complaints. No other specific complaints in a complete review of systems. Appetite has lessened off of zyprexa. He does feel fatigue frequently     Objective:   Physical Exam  well-developed well-nourished male in no acute distress. HEENT exam atraumatic, normocephalic, neck supple without jugular venous distention. Chest clear to auscultation cardiac exam S1-S2 are regular. Abdominal exam overweight with bowel sounds, soft and nontender. Extremities no edema. Neurologic exam is alert with a normal gait.        Assessment & Plan:

## 2011-08-12 ENCOUNTER — Encounter: Payer: Self-pay | Admitting: Internal Medicine

## 2011-08-12 ENCOUNTER — Ambulatory Visit (INDEPENDENT_AMBULATORY_CARE_PROVIDER_SITE_OTHER)
Admission: RE | Admit: 2011-08-12 | Discharge: 2011-08-12 | Disposition: A | Discharge status: Home / Self Care | Payer: Medicare Other | Source: Ambulatory Visit | Attending: Internal Medicine | Admitting: Internal Medicine

## 2011-08-12 ENCOUNTER — Ambulatory Visit (INDEPENDENT_AMBULATORY_CARE_PROVIDER_SITE_OTHER): Payer: Medicare Other | Admitting: Internal Medicine

## 2011-08-12 VITALS — BP 124/84 | HR 68 | Temp 98.3°F | Ht 70.0 in | Wt 192.0 lb

## 2011-08-12 DIAGNOSIS — E039 Hypothyroidism, unspecified: Secondary | ICD-10-CM

## 2011-08-12 DIAGNOSIS — M25552 Pain in left hip: Secondary | ICD-10-CM

## 2011-08-12 DIAGNOSIS — M25559 Pain in unspecified hip: Secondary | ICD-10-CM

## 2011-08-12 DIAGNOSIS — K219 Gastro-esophageal reflux disease without esophagitis: Secondary | ICD-10-CM

## 2011-08-12 DIAGNOSIS — I10 Essential (primary) hypertension: Secondary | ICD-10-CM

## 2011-08-12 NOTE — Progress Notes (Signed)
  Subjective:    Patient ID: Curtis Jimenez, male    DOB: 1936/04/17, 75 y.o.   MRN: [id number]  HPI HTN---much better  Lipids---tolerating meds  Renal insufficiency---followed by dr Mercy Moore  Past Medical History  Diagnosis Date  . GERD (gastroesophageal reflux disease)   . Hyperlipidemia   . Hypertension   . Renal insufficiency   . Gout   . Bipolar disorder   . Thyroid disease     hypothyroidism  . BPH (benign prostatic hyperplasia)    Past Surgical History  Procedure Date  . Dilation for gerd     reports that he quit smoking about 35 years ago. He does not have any smokeless tobacco history on file. His alcohol and drug histories not on file. family history is not on file. Allergies  Allergen Reactions  . Amantadine Hcl     REACTION: itching  . Chlorpromazine Hcl     REACTION: "fell flat on face"   Review of Systems  patient denies chest pain, shortness of breath, orthopnea. Denies lower extremity edema, abdominal pain, change in appetite, change in bowel movements. Patient denies rashes, musculoskeletal complaints. No other specific complaints in a complete review of systems.  Left hip pain with walking    Objective:   Physical Exam   well-developed well-nourished male in no acute distress. HEENT exam atraumatic, normocephalic, neck supple without jugular venous distention. Chest clear to auscultation cardiac exam S1-S2 are regular. Abdominal exam overweight with bowel sounds, soft and nontender. Extremities no edema. Neurologic exam is alert with a normal gait.       Assessment & Plan:

## 2011-08-20 NOTE — Assessment & Plan Note (Signed)
TSH has been stable.   

## 2011-08-20 NOTE — Assessment & Plan Note (Signed)
Well controlled. Continue current medications  

## 2011-08-20 NOTE — Assessment & Plan Note (Signed)
BP Readings from Last 3 Encounters:  08/12/11 124/84  05/12/11 102/72  12/12/10 118/70   Blood pressure is adequately controlled. Continue current medications. I suspect some of his blood pressure improvement is related to weight loss.

## 2011-11-03 ENCOUNTER — Encounter: Payer: Self-pay | Admitting: Family Medicine

## 2011-11-03 ENCOUNTER — Ambulatory Visit (INDEPENDENT_AMBULATORY_CARE_PROVIDER_SITE_OTHER): Payer: Medicare Other | Admitting: Family Medicine

## 2011-11-03 VITALS — BP 114/72 | HR 82 | Temp 98.3°F | Wt 194.0 lb

## 2011-11-03 DIAGNOSIS — E039 Hypothyroidism, unspecified: Secondary | ICD-10-CM

## 2011-11-03 DIAGNOSIS — F319 Bipolar disorder, unspecified: Secondary | ICD-10-CM

## 2011-11-03 DIAGNOSIS — R61 Generalized hyperhidrosis: Secondary | ICD-10-CM

## 2011-11-03 NOTE — Progress Notes (Signed)
  Subjective:    Patient ID: Curtis Jimenez, male    DOB: June 25, 1936, 75 y.o.   MRN: [id number]  HPI Here to discuss excessive sweating. He is a patient of Dr. Leanne Chang but has not seen him in years. He sees a doctor at the [facility] for hypothyroidism. He sees Dr. Cristy Friedlander for psychiatric care. The sweats have go on for 6 months but are getting worse. He gets wet and sweaty around the face and head, also the lower legs. No other  symptoms, no SOB or cough or fevers or weight changes. No recent med changes, but he was started on Geodon about 2 months before the sweats started. He saw the [place] doctor  yesterday ahad some labs drawn, presumably including thyroid checks. He is due to see Dr. Clovis Pu in 3 days.   Review of Systems  Constitutional: Negative.   HENT: Negative.   Respiratory: Negative.   Cardiovascular: Negative.   Gastrointestinal: Negative.        Objective:   Physical Exam  Constitutional: He is oriented to person, place, and time. He appears well-developed and well-nourished.  Neck: No thyromegaly present.  Cardiovascular: Normal rate, regular rhythm, normal heart sounds and intact distal pulses.   Pulmonary/Chest: Effort normal and breath sounds normal. No respiratory distress. He has no wheezes. He has no rales. He exhibits no tenderness.  Lymphadenopathy:    He has no cervical adenopathy.  Neurological: He is alert and oriented to person, place, and time. No cranial nerve deficit.          Assessment & Plan:  I think the 2 most likely explanations for the sweats involve abnormal thyroid levels or else side effects of Geodon. He will get the lab results form the [place], and he will ask Dr. Clovis Pu about possibe side effects of his meds. He will follow up with [place] prn

## 2011-12-14 LAB — BASIC METABOLIC PANEL
Creatinine: 3.6 mg/dL — AB (ref <=1.3)
Potassium: 3.6 mmol/L (ref 3.4–5.3)

## 2011-12-29 DIAGNOSIS — Z940 Kidney transplant status: Secondary | ICD-10-CM

## 2011-12-29 HISTORY — DX: Kidney transplant status: Z94.0

## 2012-02-12 ENCOUNTER — Ambulatory Visit: Payer: Medicare Other | Admitting: Internal Medicine

## 2012-02-15 ENCOUNTER — Ambulatory Visit (INDEPENDENT_AMBULATORY_CARE_PROVIDER_SITE_OTHER): Payer: Medicare Other | Admitting: Internal Medicine

## 2012-02-15 ENCOUNTER — Encounter: Payer: Self-pay | Admitting: Internal Medicine

## 2012-02-15 DIAGNOSIS — N183 Chronic kidney disease, stage 3 unspecified: Secondary | ICD-10-CM

## 2012-02-15 DIAGNOSIS — I10 Essential (primary) hypertension: Secondary | ICD-10-CM

## 2012-02-15 DIAGNOSIS — E785 Hyperlipidemia, unspecified: Secondary | ICD-10-CM

## 2012-02-15 DIAGNOSIS — Z23 Encounter for immunization: Secondary | ICD-10-CM

## 2012-02-15 LAB — LIPID PANEL
HDL: 35.1 mg/dL — ABNORMAL LOW (ref >39.00)
Total CHOL/HDL Ratio: 4
VLDL: 30.2 mg/dL (ref 0.0–40.0)

## 2012-02-15 LAB — HEPATIC FUNCTION PANEL
Bilirubin, Direct: 0.1 mg/dL (ref 0.0–0.3)
Total Bilirubin: 0.4 mg/dL (ref 0.3–1.2)
Total Protein: 6.7 g/dL (ref 6.0–8.3)

## 2012-02-15 LAB — TSH: TSH: 0.62 u[IU]/mL (ref 0.35–5.50)

## 2012-02-15 NOTE — Assessment & Plan Note (Signed)
Controlled Continue current meds 

## 2012-02-15 NOTE — Assessment & Plan Note (Signed)
Check labs today with liver panel and tsh  Continue current meds

## 2012-02-15 NOTE — Progress Notes (Signed)
Patient ID: Curtis Jimenez, male   DOB: 11-01-1936, 76 y.o.   MRN: [id number] Patient Active Problem List  Diagnoses  . HYPOTHYROIDISM---has been on long term replacement  . HYPERLIPIDEMIA-- has been on simvastatin. He had labs done at [facility] on Friday---i'll wait to see those results  . GOUT- no gout flare  . DISORDER, BIPOLAR NOS---mood is well controlled  . HYPERTENSION-- BPs well controlled  .   .   . Chronic kidney disease, stage III (moderate)---followed by dr. Mercy Moore, Pt tells me he is now on transplant list and is going to meet with vascular surgeon about fistula placement    Past Medical History  Diagnosis Date  . GERD (gastroesophageal reflux disease)   . Hyperlipidemia   . Hypertension   . Renal insufficiency   . Gout   . Bipolar disorder   . Thyroid disease     hypothyroidism  . BPH (benign prostatic hyperplasia)     History   Social History  . Marital Status: Married    Spouse Name: N/A    Number of Children: N/A  . Years of Education: N/A   Occupational History  . Not on file.   Social History Main Topics  . Smoking status: Former Smoker    Quit date: 05/11/1976  . Smokeless tobacco: Never Used  . Alcohol Use: 0.5 oz/week    1 drink(s) per week  . Drug Use: No  . Sexually Active: Not on file   Other Topics Concern  . Not on file   Social History Narrative  . No narrative on file    Past Surgical History  Procedure Date  . Dilation for gerd     No family history on file.  Allergies  Allergen Reactions  . Amantadine Hcl     REACTION: itching  . Chlorpromazine Hcl     REACTION: "fell flat on face"    Current Outpatient Prescriptions on File Prior to Visit  Medication Sig Dispense Refill  . allopurinol (ZYLOPRIM) 100 MG tablet Take 200 mg by mouth daily.       Marland Kitchen amLODipine (NORVASC) 10 MG tablet Take 10 mg by mouth daily.        . calcitRIOL (ROCALTROL) 0.25 MCG capsule Take 0.25 mcg by mouth daily. Take 3 times a week      .  clonazePAM (KLONOPIN) 1 MG tablet Take 1 mg by mouth daily.        . colchicine 0.6 MG tablet Take 0.6 mg by mouth daily.        . fish oil-omega-3 fatty acids 1000 MG capsule Take 2 g by mouth daily.        . furosemide (LASIX) 40 MG tablet Take 40 mg by mouth daily.        Marland Kitchen levothyroxine (SYNTHROID, LEVOTHROID) 25 MCG tablet Take 25 mcg by mouth daily.        Marland Kitchen omeprazole (PRILOSEC) 20 MG capsule Take 20 mg by mouth daily.        . simvastatin (ZOCOR) 40 MG tablet Take 20 mg by mouth at bedtime.        . ziprasidone (GEODON) 40 MG capsule Take 60 mg by mouth daily.          patient denies chest pain, shortness of breath, orthopnea. Denies lower extremity edema, abdominal pain, change in appetite, change in bowel movements. Patient denies rashes, musculoskeletal complaints. No other specific complaints in a complete review of systems.   BP 114/70  Pulse  76  Temp(Src) 97.7 F (36.5 C) (Oral)  Wt 192 lb (87.091 kg)  SpO2 96%  well-developed well-nourished male in no acute distress. HEENT exam atraumatic, normocephalic, neck supple without jugular venous distention. Chest clear to auscultation cardiac exam S1-S2 are regular. Abdominal exam overweight with bowel sounds, soft and nontender. Extremities no edema. Neurologic exam is alert with a normal gait.

## 2012-02-15 NOTE — Assessment & Plan Note (Signed)
Reviewed labs and Dr. Etheleen Nicks note. BP is well controlled

## 2012-02-17 NOTE — Progress Notes (Signed)
Wife informed

## 2012-03-08 ENCOUNTER — Ambulatory Visit: Payer: Medicare Other | Admitting: Internal Medicine

## 2012-03-11 ENCOUNTER — Other Ambulatory Visit: Payer: Self-pay

## 2012-03-11 DIAGNOSIS — Z01810 Encounter for preprocedural cardiovascular examination: Secondary | ICD-10-CM

## 2012-03-11 DIAGNOSIS — N184 Chronic kidney disease, stage 4 (severe): Secondary | ICD-10-CM

## 2012-03-14 ENCOUNTER — Ambulatory Visit (INDEPENDENT_AMBULATORY_CARE_PROVIDER_SITE_OTHER): Payer: Medicare Other | Admitting: Internal Medicine

## 2012-03-14 DIAGNOSIS — Z23 Encounter for immunization: Secondary | ICD-10-CM

## 2012-03-14 DIAGNOSIS — N184 Chronic kidney disease, stage 4 (severe): Secondary | ICD-10-CM

## 2012-03-17 ENCOUNTER — Encounter: Payer: Self-pay | Admitting: Vascular Surgery

## 2012-03-18 ENCOUNTER — Ambulatory Visit (INDEPENDENT_AMBULATORY_CARE_PROVIDER_SITE_OTHER): Payer: Medicare Other | Admitting: Vascular Surgery

## 2012-03-18 ENCOUNTER — Encounter: Payer: Self-pay | Admitting: Vascular Surgery

## 2012-03-18 ENCOUNTER — Encounter (INDEPENDENT_AMBULATORY_CARE_PROVIDER_SITE_OTHER): Payer: Medicare Other | Admitting: *Deleted

## 2012-03-18 VITALS — BP 105/63 | HR 72 | Resp 18 | Ht 70.0 in | Wt 190.0 lb

## 2012-03-18 DIAGNOSIS — Z01810 Encounter for preprocedural cardiovascular examination: Secondary | ICD-10-CM

## 2012-03-18 DIAGNOSIS — N184 Chronic kidney disease, stage 4 (severe): Secondary | ICD-10-CM

## 2012-03-18 DIAGNOSIS — N183 Chronic kidney disease, stage 3 unspecified: Secondary | ICD-10-CM

## 2012-03-18 NOTE — Progress Notes (Signed)
[facility]  Referred by:  Windy Kalata, MD [address] [place], [place] [address]  Reason for referral: New access  History of Present Illness  Curtis Jimenez is a 76 y.o. (1936/03/30) male who presents for evaluation for permanent access.  The patient is right hand dominant.  The patient has not had previous access procedures.  Previous central venous cannulation procedures include: none.  The patient has never had a PPM placed.  His renal failure is due lithium use.  Past Medical History  Diagnosis Date  . GERD (gastroesophageal reflux disease)   . Hyperlipidemia   . Hypertension   . Renal insufficiency   . Gout   . Bipolar disorder   . Thyroid disease     hypothyroidism  . BPH (benign prostatic hyperplasia)     Past Surgical History  Procedure Date  . Dilation for gerd     History   Social History  . Marital Status: Married    Spouse Name: N/A    Number of Children: N/A  . Years of Education: N/A   Occupational History  . Not on file.   Social History Main Topics  . Smoking status: Former Smoker -- 16 years    Types: Pipe, Cigars    Quit date: 05/11/1976  . Smokeless tobacco: Never Used  . Alcohol Use: 0.5 oz/week    1 drink(s) per week  . Drug Use: No  . Sexually Active: Not on file   Other Topics Concern  . Not on file   Social History Narrative  . No narrative on file    Family History  Problem Relation Age of Onset  . Cancer Father     BLADDER  . Heart disease Father     Current Outpatient Prescriptions on File Prior to Visit  Medication Sig Dispense Refill  . allopurinol (ZYLOPRIM) 100 MG tablet Take 200 mg by mouth daily.       Marland Kitchen amLODipine (NORVASC) 10 MG tablet Take 10 mg by mouth daily.        . calcitRIOL (ROCALTROL) 0.25 MCG capsule Take 0.25 mcg by mouth daily. Take 3 times a week      . carvedilol (COREG) 12.5 MG tablet Take 12.5 mg by mouth 2 (two) times daily with a meal.      . clonazePAM  (KLONOPIN) 1 MG tablet Take 1 mg by mouth daily.        . fish oil-omega-3 fatty acids 1000 MG capsule Take 3 g by mouth daily.       . furosemide (LASIX) 40 MG tablet Take 40 mg by mouth daily.        Marland Kitchen levothyroxine (SYNTHROID, LEVOTHROID) 25 MCG tablet Take 25 mcg by mouth daily.        Marland Kitchen omeprazole (PRILOSEC) 20 MG capsule Take 20 mg by mouth daily.        . risperiDONE (RISPERDAL) 2 MG tablet Take 1/2 tablet daily.      . simvastatin (ZOCOR) 40 MG tablet Take 20 mg by mouth at bedtime.        . colchicine 0.6 MG tablet Take 0.6 mg by mouth daily.        . ziprasidone (GEODON) 40 MG capsule Take 60 mg by mouth daily.         Allergies  Allergen Reactions  . Amantadine Hcl     REACTION: itching  . Chlorpromazine Hcl     REACTION: "fell flat on face"  REVIEW OF SYSTEMS:  (Positives indicated with an "x", otherwise negative)  CARDIOVASCULAR: [ ]  chest pain    [ ]  chest pressure    [ ]  palpitations    [ ]  orthopnea   [ ]  dyspnea on exert. [ ]  claudication    [ ]  rest pain     [ ]  DVT     [ ]  phlebitis  PULMONARY:    [ ]  productive cough [ ]  asthma  [ ]  wheezing  NEUROLOGIC:    [ ]  weakness    [ ]  paresthesias   [ ]  aphasia    [ ]  amaurosis    [ ]  dizziness  HEMATOLOGIC:    [ ]  bleeding problems  [ ]  clotting disorders  MUSCULOSKEL: [ ]  joint pain     [ ]  joint swelling  GASTROINTEST:  [ ]   blood in stool   [ ]   hematemesis  GENITOURINARY:   [ ]   dysuria    [ ]   hematuria  PSYCHIATRIC:   [ ]  history of major depression  INTEGUMENTARY: [ ]  rashes    [ ]  ulcers  CONSTITUTIONAL:  [ ]  fever     [ ]  chills  Physical Examination  Filed Vitals:   03/18/12 1352  BP: 105/63  Pulse: 72  Resp: 18  Height: 5\' 10"  (1.778 m)  Weight: 190 lb (86.183 kg)   Body mass index is 27.26 kg/(m^2).  General: A&O x 3, WDWN  Head: [place]/AT  Ear/Nose/Throat: Hearing grossly intact, nares w/o erythema or drainage, oropharynx w/o Erythema/Exudate  Eyes: PERRLA, EOMI  Neck: Supple, no  nuchal rigidity, no palpable LAD  Pulmonary: Sym exp, good air movt, CTAB, no rales, rhonchi, & wheezing  Cardiac: RRR, Nl S1, S2, no Murmurs, rubs or gallops  Vascular: Vessel Right Left  Radial Palpable Palpable  Brachial Palpable Palpable  Carotid Palpable, without bruit Palpable, without bruit  Aorta Non-palpable N/A  Femoral Palpable Palpable  Popliteal Non-palpable Non-palpable  PT Palpable Palpable  DP Palpable Palpable   Gastrointestinal: soft, NTND, -G/R, - HSM, - masses, - CVAT B  Musculoskeletal: M/S 5/5 throughout , Extremities without ischemic changes   Neurologic: CN 2-12 intact , Pain and light touch intact in extremities   Psychiatric: Judgment intact, Mood & affect appropriate for pt's clinical situation  Dermatologic: See M/S exam for extremity exam, no rashes otherwise noted  Lymph : No Cervical, Axillary, or Inguinal lymphadenopathy   Non-Invasive Vascular Imaging  Vein Mapping  (Date: 03/18/12):   R arm: acceptable vein conduits include all   L arm: acceptable vein conduits include all  Outside Studies/Documentation 5 pages of outside documents were reviewed including: nephrologic clinic charts.  Medical Decision Making  Curtis Jimenez is a 76 y.o. male who presents with chronic kidney disease stage IV not requiring hemodialysis yet  Based on vein mapping and examination, this patient's permanent access options include: B RC AVF, B BC AVF, B stage BVT  I had an extensive discussion with this patient in regards to the nature of access surgery, including risk, benefits, and alternatives.    The patient is aware that the risks of access surgery include but are not limited to: bleeding, infection, steal syndrome, nerve damage, ischemic monomelic neuropathy, failure of access to mature, and possible need for additional access procedures in the future.  The patient has agreed to proceed with the above procedure which will be scheduled for a L RC vs  BC AVF on 12 APR 13 with  Dr. Scot Dock (pt wants Friday operative day to recover for work on Monday).  Adele Barthel, MD [facility] Office: [phone] Pager: [phone]  03/18/2012, 5:49 PM

## 2012-03-24 ENCOUNTER — Other Ambulatory Visit: Payer: Self-pay

## 2012-03-29 ENCOUNTER — Encounter (HOSPITAL_COMMUNITY): Payer: Self-pay | Admitting: Pharmacy Technician

## 2012-04-01 NOTE — Procedures (Unsigned)
CEPHALIC VEIN MAPPING  INDICATION:  Chronic kidney disease stage IV  HISTORY:  EXAM: The right cephalic vein is compressible with diameter measurements ranging from 0.28 to 0.49 cm.  The right basilic vein is compressible with diameter measurements ranging from 0.21 to 0.32 cm.  The left cephalic vein is compressible with diameter measurements ranging from 0.23 to 0.51 cm.  The left basilic vein is compressible with diameter measurements ranging from 0.28 to 0.47 cm.  See attached worksheet for all measurements.  IMPRESSION:  Patent bilateral cephalic and basilic veins with diameter measurements described above.  ___________________________________________ Conrad Glen Flora, MD  CH/MEDQ  D:  03/22/2012  T:  03/22/2012  Job:  [id number]

## 2012-04-04 ENCOUNTER — Encounter (HOSPITAL_COMMUNITY): Payer: Self-pay

## 2012-04-04 ENCOUNTER — Encounter (HOSPITAL_COMMUNITY)
Admission: RE | Admit: 2012-04-04 | Discharge: 2012-04-04 | Disposition: A | Discharge status: Home / Self Care | Payer: Medicare Other | Source: Ambulatory Visit | Attending: Vascular Surgery | Admitting: Vascular Surgery

## 2012-04-04 ENCOUNTER — Ambulatory Visit (HOSPITAL_COMMUNITY)
Admission: RE | Admit: 2012-04-04 | Discharge: 2012-04-04 | Disposition: A | Discharge status: Home / Self Care | Payer: Medicare Other | Source: Ambulatory Visit | Attending: Vascular Surgery | Admitting: Vascular Surgery

## 2012-04-04 DIAGNOSIS — Z01812 Encounter for preprocedural laboratory examination: Secondary | ICD-10-CM | POA: Insufficient documentation

## 2012-04-04 DIAGNOSIS — Z01818 Encounter for other preprocedural examination: Secondary | ICD-10-CM | POA: Insufficient documentation

## 2012-04-04 DIAGNOSIS — I10 Essential (primary) hypertension: Secondary | ICD-10-CM | POA: Insufficient documentation

## 2012-04-04 HISTORY — DX: Hypothyroidism, unspecified: E03.9

## 2012-04-04 HISTORY — DX: Unspecified osteoarthritis, unspecified site: M19.90

## 2012-04-04 LAB — SURGICAL PCR SCREEN: MRSA, PCR: NEGATIVE

## 2012-04-04 NOTE — Pre-Procedure Instructions (Signed)
[place]  04/04/2012   Your procedure is scheduled on: Fri, April 12 @ 0730  Report to [facility].  Call this number if you have problems the morning of surgery: [phone]   Remember:   Do not eat food:After Midnight.  May have clear liquids: up to 4 Hours before arrival.(until 1:30 am)  Clear liquids include soda, tea, black coffee, apple or grape juice, broth,water  Take these medicines the morning of surgery with A SIP OF WATER: Allopurinol,Amlodipine,Carvedilol,Clonazepam,Levothyroxine,Prilosec,and Risperdal   Do not wear jewelry  Do not wear lotions, powders, or perfumes.   Do not bring valuables to the hospital.  Contacts, dentures or bridgework may not be worn into surgery.  Leave suitcase in the car. After surgery it may be brought to your room.  For patients admitted to the hospital, checkout time is 11:00 AM the day of discharge.   Patients discharged the day of surgery will not be allowed to drive home.  Special Instructions: CHG Shower Use Special Wash: 1/2 bottle night before surgery and 1/2 bottle morning of surgery.   Please read over the following fact sheets that you were given: Pain Booklet, Coughing and Deep Breathing, MRSA Information and Surgical Site Infection Prevention

## 2012-04-08 ENCOUNTER — Encounter (HOSPITAL_COMMUNITY): Payer: Self-pay | Admitting: *Deleted

## 2012-04-08 ENCOUNTER — Encounter (HOSPITAL_COMMUNITY): Payer: Self-pay | Admitting: Certified Registered"

## 2012-04-08 ENCOUNTER — Ambulatory Visit (HOSPITAL_COMMUNITY): Payer: Medicare Other | Admitting: Certified Registered"

## 2012-04-08 ENCOUNTER — Ambulatory Visit (HOSPITAL_COMMUNITY)
Admission: RE | Admit: 2012-04-08 | Discharge: 2012-04-08 | Disposition: A | Discharge status: Home / Self Care | Payer: Medicare Other | Source: Ambulatory Visit | Attending: Vascular Surgery | Admitting: Vascular Surgery

## 2012-04-08 ENCOUNTER — Encounter (HOSPITAL_COMMUNITY)
Admission: RE | Disposition: A | Discharge status: Home / Self Care | Payer: Self-pay | Source: Ambulatory Visit | Attending: Vascular Surgery

## 2012-04-08 DIAGNOSIS — I129 Hypertensive chronic kidney disease with stage 1 through stage 4 chronic kidney disease, or unspecified chronic kidney disease: Secondary | ICD-10-CM | POA: Insufficient documentation

## 2012-04-08 DIAGNOSIS — F319 Bipolar disorder, unspecified: Secondary | ICD-10-CM | POA: Insufficient documentation

## 2012-04-08 DIAGNOSIS — K219 Gastro-esophageal reflux disease without esophagitis: Secondary | ICD-10-CM | POA: Insufficient documentation

## 2012-04-08 DIAGNOSIS — N189 Chronic kidney disease, unspecified: Secondary | ICD-10-CM | POA: Insufficient documentation

## 2012-04-08 DIAGNOSIS — N184 Chronic kidney disease, stage 4 (severe): Secondary | ICD-10-CM

## 2012-04-08 DIAGNOSIS — N186 End stage renal disease: Secondary | ICD-10-CM

## 2012-04-08 HISTORY — PX: AV FISTULA PLACEMENT: SHX1204

## 2012-04-08 LAB — POCT I-STAT 4, (NA,K, GLUC, HGB,HCT)
Glucose, Bld: 95 mg/dL (ref 70–99)
HCT: 37 % — ABNORMAL LOW (ref 39.0–52.0)
Hemoglobin: 12.6 g/dL — ABNORMAL LOW (ref 13.0–17.0)

## 2012-04-08 SURGERY — ARTERIOVENOUS (AV) FISTULA CREATION
Anesthesia: Monitor Anesthesia Care | Site: Arm Upper | Laterality: Left | Wound class: Clean

## 2012-04-08 MED ORDER — SODIUM CHLORIDE 0.9 % IV SOLN
INTRAVENOUS | Status: DC | PRN
  Administered 2012-04-08: 07:00:00 via INTRAVENOUS

## 2012-04-08 MED ORDER — 0.9 % SODIUM CHLORIDE (POUR BTL) OPTIME
TOPICAL | Status: DC | PRN
  Administered 2012-04-08: 1000 mL

## 2012-04-08 MED ORDER — PROTAMINE SULFATE 10 MG/ML IV SOLN
INTRAVENOUS | Status: DC | PRN
  Administered 2012-04-08: 30 mg via INTRAVENOUS

## 2012-04-08 MED ORDER — OXYCODONE HCL 5 MG PO CAPS: 5.0000 mg | ORAL_CAPSULE | Freq: Four times a day (QID) | ORAL | Status: AC | PRN

## 2012-04-08 MED ORDER — PROPOFOL 10 MG/ML IV EMUL
INTRAVENOUS | Status: DC | PRN
  Administered 2012-04-08: 100 ug/kg/min via INTRAVENOUS

## 2012-04-08 MED ORDER — CEFAZOLIN SODIUM 1-5 GM-% IV SOLN: 1.0000 g | Freq: Once | INTRAVENOUS | Status: DC

## 2012-04-08 MED ORDER — SODIUM CHLORIDE 0.9 % IV SOLN: INTRAVENOUS | Status: DC

## 2012-04-08 MED ORDER — CEFAZOLIN SODIUM-DEXTROSE 2-3 GM-% IV SOLR
INTRAVENOUS | Status: AC
  Administered 2012-04-08: 2 g via INTRAVENOUS
  Filled 2012-04-08: qty 50

## 2012-04-08 MED ORDER — HEPARIN SODIUM (PORCINE) 1000 UNIT/ML IJ SOLN
INTRAMUSCULAR | Status: DC | PRN
  Administered 2012-04-08: 6000 [IU] via INTRAVENOUS

## 2012-04-08 MED ORDER — FENTANYL CITRATE 0.05 MG/ML IJ SOLN
INTRAMUSCULAR | Status: DC | PRN
  Administered 2012-04-08: 50 ug via INTRAVENOUS

## 2012-04-08 MED ORDER — LIDOCAINE HCL (PF) 1 % IJ SOLN
INTRAMUSCULAR | Status: DC | PRN
  Administered 2012-04-08: 5 mL

## 2012-04-08 MED ORDER — SODIUM CHLORIDE 0.9 % IR SOLN
Status: DC | PRN
  Administered 2012-04-08: 08:00:00

## 2012-04-08 SURGICAL SUPPLY — 36 items
CANISTER SUCTION 2500CC (MISCELLANEOUS) ×2 IMPLANT
CLIP TI MEDIUM 6 (CLIP) ×2 IMPLANT
CLIP TI WIDE RED SMALL 6 (CLIP) ×2 IMPLANT
CLOTH BEACON ORANGE TIMEOUT ST (SAFETY) ×2 IMPLANT
COVER PROBE W GEL 5X96 (DRAPES) IMPLANT
COVER SURGICAL LIGHT HANDLE (MISCELLANEOUS) ×4 IMPLANT
DECANTER SPIKE VIAL GLASS SM (MISCELLANEOUS) ×2 IMPLANT
DERMABOND ADVANCED (GAUZE/BANDAGES/DRESSINGS) ×1
DERMABOND ADVANCED .7 DNX12 (GAUZE/BANDAGES/DRESSINGS) ×1 IMPLANT
DRAIN PENROSE 1/2X12 LTX STRL (WOUND CARE) IMPLANT
ELECT REM PT RETURN 9FT ADLT (ELECTROSURGICAL) ×2
ELECTRODE REM PT RTRN 9FT ADLT (ELECTROSURGICAL) ×1 IMPLANT
GLOVE BIO SURGEON STRL SZ 6.5 (GLOVE) ×4 IMPLANT
GLOVE BIO SURGEON STRL SZ7.5 (GLOVE) ×2 IMPLANT
GLOVE BIOGEL PI IND STRL 7.0 (GLOVE) ×1 IMPLANT
GLOVE BIOGEL PI IND STRL 7.5 (GLOVE) ×1 IMPLANT
GLOVE BIOGEL PI INDICATOR 7.0 (GLOVE) ×1
GLOVE BIOGEL PI INDICATOR 7.5 (GLOVE) ×1
GLOVE SURG SS PI 7.5 STRL IVOR (GLOVE) ×2 IMPLANT
GOWN STRL NON-REIN LRG LVL3 (GOWN DISPOSABLE) ×4 IMPLANT
GOWN STRL REIN XL XLG (GOWN DISPOSABLE) ×2 IMPLANT
KIT BASIN OR (CUSTOM PROCEDURE TRAY) ×2 IMPLANT
KIT ROOM TURNOVER OR (KITS) ×2 IMPLANT
NS IRRIG 1000ML POUR BTL (IV SOLUTION) ×2 IMPLANT
PACK CV ACCESS (CUSTOM PROCEDURE TRAY) ×2 IMPLANT
PAD ARMBOARD 7.5X6 YLW CONV (MISCELLANEOUS) ×4 IMPLANT
SPONGE GAUZE 4X4 12PLY (GAUZE/BANDAGES/DRESSINGS) ×6 IMPLANT
SPONGE SURGIFOAM ABS GEL 100 (HEMOSTASIS) IMPLANT
SUT PROLENE 6 0 BV (SUTURE) ×2 IMPLANT
SUT VIC AB 3-0 SH 27 (SUTURE) ×1
SUT VIC AB 3-0 SH 27X BRD (SUTURE) ×1 IMPLANT
SUT VICRYL 4-0 PS2 18IN ABS (SUTURE) ×2 IMPLANT
TOWEL OR 17X24 6PK STRL BLUE (TOWEL DISPOSABLE) ×2 IMPLANT
TOWEL OR 17X26 10 PK STRL BLUE (TOWEL DISPOSABLE) ×2 IMPLANT
UNDERPAD 30X30 INCONTINENT (UNDERPADS AND DIAPERS) ×2 IMPLANT
WATER STERILE IRR 1000ML POUR (IV SOLUTION) ×2 IMPLANT

## 2012-04-08 NOTE — Preoperative (Signed)
Beta Blockers   Reason not to administer Beta Blockers:Coreg @ 0430 04/08/2012

## 2012-04-08 NOTE — Transfer of Care (Signed)
Immediate Anesthesia Transfer of Care Note  Patient: Curtis Jimenez  Procedure(s) Performed: Procedure(s) (LRB): ARTERIOVENOUS (AV) FISTULA CREATION (Left)  Patient Location: PACU  Anesthesia Type: MAC  Level of Consciousness: awake  Airway & Oxygen Therapy: Patient Spontanous Breathing and Patient connected to nasal cannula oxygen  Post-op Assessment: Report given to PACU RN, Post -op Vital signs reviewed and stable and Patient moving all extremities  Post vital signs: Reviewed and stable  Complications: No apparent anesthesia complications

## 2012-04-08 NOTE — Anesthesia Preprocedure Evaluation (Addendum)
Anesthesia Evaluation  Patient identified by MRN, date of birth, ID band Patient awake    Reviewed: Allergy & Precautions, H&P , NPO status , Patient's Chart, lab work & pertinent test results, reviewed documented beta blocker date and time   Airway Mallampati: I TM Distance: >3 FB Neck ROM: Full    Dental  (+) Teeth Intact   Pulmonary          Cardiovascular hypertension, Pt. on medications and Pt. on home beta blockers Rhythm:regular Rate:Normal     Neuro/Psych PSYCHIATRIC DISORDERS Bipolar Disorder    GI/Hepatic GERD-  Controlled and Medicated,  Endo/Other  Hypothyroidism   Renal/GU CRF     Musculoskeletal   Abdominal   Peds  Hematology   Anesthesia Other Findings   Reproductive/Obstetrics                          Anesthesia Physical Anesthesia Plan  ASA: II  Anesthesia Plan: MAC   Post-op Pain Management:    Induction: Intravenous  Airway Management Planned: Nasal Cannula  Additional Equipment:   Intra-op Plan:   Post-operative Plan:   Informed Consent: I have reviewed the patients History and Physical, chart, labs and discussed the procedure including the risks, benefits and alternatives for the proposed anesthesia with the patient or authorized representative who has indicated his/her understanding and acceptance.   Dental advisory given  Plan Discussed with: Surgeon, Anesthesiologist and CRNA  Anesthesia Plan Comments:         Anesthesia Quick Evaluation

## 2012-04-08 NOTE — Anesthesia Postprocedure Evaluation (Signed)
  Anesthesia Post-op Note  Patient: Curtis Jimenez  Procedure(s) Performed: Procedure(s) (LRB): ARTERIOVENOUS (AV) FISTULA CREATION (Left)  Patient Location: PACU  Anesthesia Type: MAC  Level of Consciousness: awake, alert , oriented and patient cooperative  Airway and Oxygen Therapy: Patient Spontanous Breathing and Patient connected to nasal cannula oxygen  Post-op Pain: none  Post-op Assessment: Post-op Vital signs reviewed, Patient's Cardiovascular Status Stable, Respiratory Function Stable, Patent Airway, No signs of Nausea or vomiting and Pain level controlled  Post-op Vital Signs: stable  Complications: No apparent anesthesia complications

## 2012-04-08 NOTE — Discharge Instructions (Signed)
    04/08/2012 USHER HOFACKER [id number] 1936-04-26  Surgeon(s): Angelia Mould, MD  Procedure(s): ARTERIOVENOUS (AV) FISTULA CREATION-left brachiocephalic AVF        x Do not stick graft for 12 weeks

## 2012-04-08 NOTE — Interval H&P Note (Signed)
History and Physical Interval Note:  04/08/2012 7:04 AM  Curtis Jimenez  has presented today for surgery, with the diagnosis of End Stage Renal Disease  The various methods of treatment have been discussed with the patient and family. After consideration of risks, benefits and other options for treatment, the patient has consented to: LEFT ARTERIOVENOUS (AV) FISTULA CREATION.  The patients' history has been reviewed, patient examined, no change in status, stable for surgery.  I have reviewed the patients' chart and labs.  Questions were answered to the patient's satisfaction.     Almon Whitford S

## 2012-04-08 NOTE — H&P (View-Only) (Signed)
[facility]  Referred by:  Windy Kalata, MD [address] [place], [place] [address]  Reason for referral: New access  History of Present Illness  Curtis Jimenez is a 76 y.o. (15-Dec-1936) male who presents for evaluation for permanent access.  The patient is right hand dominant.  The patient has not had previous access procedures.  Previous central venous cannulation procedures include: none.  The patient has never had a PPM placed.  His renal failure is due lithium use.  Past Medical History  Diagnosis Date  . GERD (gastroesophageal reflux disease)   . Hyperlipidemia   . Hypertension   . Renal insufficiency   . Gout   . Bipolar disorder   . Thyroid disease     hypothyroidism  . BPH (benign prostatic hyperplasia)     Past Surgical History  Procedure Date  . Dilation for gerd     History   Social History  . Marital Status: Married    Spouse Name: N/A    Number of Children: N/A  . Years of Education: N/A   Occupational History  . Not on file.   Social History Main Topics  . Smoking status: Former Smoker -- 16 years    Types: Pipe, Cigars    Quit date: 05/11/1976  . Smokeless tobacco: Never Used  . Alcohol Use: 0.5 oz/week    1 drink(s) per week  . Drug Use: No  . Sexually Active: Not on file   Other Topics Concern  . Not on file   Social History Narrative  . No narrative on file    Family History  Problem Relation Age of Onset  . Cancer Father     BLADDER  . Heart disease Father     Current Outpatient Prescriptions on File Prior to Visit  Medication Sig Dispense Refill  . allopurinol (ZYLOPRIM) 100 MG tablet Take 200 mg by mouth daily.       Marland Kitchen amLODipine (NORVASC) 10 MG tablet Take 10 mg by mouth daily.        . calcitRIOL (ROCALTROL) 0.25 MCG capsule Take 0.25 mcg by mouth daily. Take 3 times a week      . carvedilol (COREG) 12.5 MG tablet Take 12.5 mg by mouth 2 (two) times daily with a meal.      . clonazePAM  (KLONOPIN) 1 MG tablet Take 1 mg by mouth daily.        . fish oil-omega-3 fatty acids 1000 MG capsule Take 3 g by mouth daily.       . furosemide (LASIX) 40 MG tablet Take 40 mg by mouth daily.        Marland Kitchen levothyroxine (SYNTHROID, LEVOTHROID) 25 MCG tablet Take 25 mcg by mouth daily.        Marland Kitchen omeprazole (PRILOSEC) 20 MG capsule Take 20 mg by mouth daily.        . risperiDONE (RISPERDAL) 2 MG tablet Take 1/2 tablet daily.      . simvastatin (ZOCOR) 40 MG tablet Take 20 mg by mouth at bedtime.        . colchicine 0.6 MG tablet Take 0.6 mg by mouth daily.        . ziprasidone (GEODON) 40 MG capsule Take 60 mg by mouth daily.         Allergies  Allergen Reactions  . Amantadine Hcl     REACTION: itching  . Chlorpromazine Hcl     REACTION: "fell flat on face"  REVIEW OF SYSTEMS:  (Positives indicated with an "x", otherwise negative)  CARDIOVASCULAR: [ ]  chest pain    [ ]  chest pressure    [ ]  palpitations    [ ]  orthopnea   [ ]  dyspnea on exert. [ ]  claudication    [ ]  rest pain     [ ]  DVT     [ ]  phlebitis  PULMONARY:    [ ]  productive cough [ ]  asthma  [ ]  wheezing  NEUROLOGIC:    [ ]  weakness    [ ]  paresthesias   [ ]  aphasia    [ ]  amaurosis    [ ]  dizziness  HEMATOLOGIC:    [ ]  bleeding problems  [ ]  clotting disorders  MUSCULOSKEL: [ ]  joint pain     [ ]  joint swelling  GASTROINTEST:  [ ]   blood in stool   [ ]   hematemesis  GENITOURINARY:   [ ]   dysuria    [ ]   hematuria  PSYCHIATRIC:   [ ]  history of major depression  INTEGUMENTARY: [ ]  rashes    [ ]  ulcers  CONSTITUTIONAL:  [ ]  fever     [ ]  chills  Physical Examination  Filed Vitals:   03/18/12 1352  BP: 105/63  Pulse: 72  Resp: 18  Height: 5\' 10"  (1.778 m)  Weight: 190 lb (86.183 kg)   Body mass index is 27.26 kg/(m^2).  General: A&O x 3, WDWN  Head: [place]/AT  Ear/Nose/Throat: Hearing grossly intact, nares w/o erythema or drainage, oropharynx w/o Erythema/Exudate  Eyes: PERRLA, EOMI  Neck: Supple, no  nuchal rigidity, no palpable LAD  Pulmonary: Sym exp, good air movt, CTAB, no rales, rhonchi, & wheezing  Cardiac: RRR, Nl S1, S2, no Murmurs, rubs or gallops  Vascular: Vessel Right Left  Radial Palpable Palpable  Brachial Palpable Palpable  Carotid Palpable, without bruit Palpable, without bruit  Aorta Non-palpable N/A  Femoral Palpable Palpable  Popliteal Non-palpable Non-palpable  PT Palpable Palpable  DP Palpable Palpable   Gastrointestinal: soft, NTND, -G/R, - HSM, - masses, - CVAT B  Musculoskeletal: M/S 5/5 throughout , Extremities without ischemic changes   Neurologic: CN 2-12 intact , Pain and light touch intact in extremities   Psychiatric: Judgment intact, Mood & affect appropriate for pt's clinical situation  Dermatologic: See M/S exam for extremity exam, no rashes otherwise noted  Lymph : No Cervical, Axillary, or Inguinal lymphadenopathy   Non-Invasive Vascular Imaging  Vein Mapping  (Date: 03/18/12):   R arm: acceptable vein conduits include all   L arm: acceptable vein conduits include all  Outside Studies/Documentation 5 pages of outside documents were reviewed including: nephrologic clinic charts.  Medical Decision Making  Curtis Jimenez is a 76 y.o. male who presents with chronic kidney disease stage IV not requiring hemodialysis yet  Based on vein mapping and examination, this patient's permanent access options include: B RC AVF, B BC AVF, B stage BVT  I had an extensive discussion with this patient in regards to the nature of access surgery, including risk, benefits, and alternatives.    The patient is aware that the risks of access surgery include but are not limited to: bleeding, infection, steal syndrome, nerve damage, ischemic monomelic neuropathy, failure of access to mature, and possible need for additional access procedures in the future.  The patient has agreed to proceed with the above procedure which will be scheduled for a L RC vs  BC AVF on 12 APR 13 with  Dr. Scot Dock (pt wants Friday operative day to recover for work on Monday).  Adele Barthel, MD [facility] Office: [phone] Pager: [phone]  03/18/2012, 5:49 PM

## 2012-04-08 NOTE — Op Note (Signed)
NAME: Curtis Jimenez   MRN: [id number] DOB: [birth year]    DATE OF OPERATION: 04/08/2012  PREOP DIAGNOSIS: Chronic kidney disease  POSTOP DIAGNOSIS: Same  PROCEDURE: Left brachial cephalic AV fistula  SURGEON: Judeth Cornfield. Scot Dock, MD, FACS  ASSIST: Evorn Gong PA  ANESTHESIA: local with sedation   EBL: minimal  INDICATIONS: Curtis Jimenez is a 76 y.o. male is not yet on dialysis. We are asked to place access.  FINDINGS: the forearm cephalic vein had spasm and and I felt the upper arm cephalic vein appeared to be the best option for access. The vein was 5 mm. The artery had no significant disease. He had a palpable radial pulse at the completion of the procedure and an excellent thrill.  TECHNIQUE: The patient was brought to the operating room and sedated by anesthesia. The left upper extremity was prepped and draped in the usual sterile fashion. An incision was made above the antecubital level where the brachial artery was dissected free. It was a good-sized artery with no significant plaque. The cephalic vein was dissected free and ligated distally. It irrigated nicely with heparinized saline. The patient was then heparinized. After the heparin had circulated, the brachial artery was clamped proximally and distally and a longitudinal arteriotomy was made. The vein was mobilized over and sewn end-to-side to the artery using continuous 6-0 Prolene suture. At the completion was an excellent thrill in the fistula and a palpable radial pulse. The incision was closed with a deep layer of 3-0 Vicryl and the skin closed with 4-0 Vicryl. The heparin had been partially reversed with protamine. Dermabond was applied. The patient tolerated the procedure well and was transferred to the recovery room in stable condition. All needle and sponge counts were correct.  Deitra Mayo, MD, FACS [facility]  DATE OF DICTATION:   04/08/2012

## 2012-04-11 ENCOUNTER — Encounter (HOSPITAL_COMMUNITY): Payer: Self-pay | Admitting: Vascular Surgery

## 2012-04-14 ENCOUNTER — Ambulatory Visit: Payer: Medicare Other | Admitting: Vascular Surgery

## 2012-05-18 ENCOUNTER — Ambulatory Visit: Payer: Medicare Other | Admitting: Vascular Surgery

## 2012-05-24 ENCOUNTER — Encounter: Payer: Self-pay | Admitting: Vascular Surgery

## 2012-05-25 ENCOUNTER — Ambulatory Visit (INDEPENDENT_AMBULATORY_CARE_PROVIDER_SITE_OTHER): Payer: Medicare Other | Admitting: Vascular Surgery

## 2012-05-25 ENCOUNTER — Encounter: Payer: Self-pay | Admitting: Vascular Surgery

## 2012-05-25 VITALS — BP 110/61 | HR 80 | Resp 18 | Ht 70.0 in | Wt 189.0 lb

## 2012-05-25 DIAGNOSIS — N186 End stage renal disease: Secondary | ICD-10-CM

## 2012-05-25 NOTE — Progress Notes (Signed)
[facility]  Patient name: Curtis Jimenez MRN: [id number] DOB: December 19, 1936 Sex: male  REASON FOR VISIT: follow up of left brachiocephalic AV fistula  HPI: AARIT DEPNER is a 76 y.o. male who is not yet on dialysis. He had a left brachiocephalic AV fistula placed on 04/08/2012. He comes in for a 6 week follow up visit. He said no pain in the left arm and no pain in the left hand.   REVIEW OF SYSTEMS: Valu.Nieves ] denotes positive finding; [  ] denotes negative finding  CARDIOVASCULAR:  [ ]  chest pain   [ ]  dyspnea on exertion    CONSTITUTIONAL:  [ ]  fever   [ ]  chills  PHYSICAL EXAM: Filed Vitals:   05/25/12 1528  BP: 110/61  Pulse: 80  Resp: 18  Height: 5\' 10"  (1.778 m)  Weight: 189 lb (85.73 kg)   Body mass index is 27.12 kg/(m^2). GENERAL: The patient is a well-nourished male, in no acute distress. The vital signs are documented above. CARDIOVASCULAR: There is a regular rate and rhythm  PULMONARY: There is good air exchange bilaterally without wheezing or rales. His left upper arm fistula has an excellent bruit and thrill. He has a palpable left radial pulse. His incision is healing nicely.  MEDICAL ISSUES: His left upper arm AV fistula is maturing nicely and should be ready for access if and when it is needed. We'll see him back as needed.  [place] [facility] [place] [place] Beeper: [phone]

## 2012-06-24 ENCOUNTER — Telehealth: Payer: Self-pay | Admitting: Internal Medicine

## 2012-06-24 DIAGNOSIS — Z0000 Encounter for general adult medical examination without abnormal findings: Secondary | ICD-10-CM

## 2012-06-24 NOTE — Telephone Encounter (Signed)
Patient called stating that he is going to have a kidney transplant and they require that he have a colonoscopy within the last five years. Patient would like a referral for the colonoscopy. Please advise.

## 2012-06-24 NOTE — Telephone Encounter (Signed)
Ok per Dr Leanne Chang referral order placed

## 2012-06-27 ENCOUNTER — Ambulatory Visit (INDEPENDENT_AMBULATORY_CARE_PROVIDER_SITE_OTHER): Payer: Medicare Other | Admitting: *Deleted

## 2012-06-27 DIAGNOSIS — Z0000 Encounter for general adult medical examination without abnormal findings: Secondary | ICD-10-CM

## 2012-06-28 ENCOUNTER — Encounter: Payer: Self-pay | Admitting: Gastroenterology

## 2012-06-29 LAB — TB SKIN TEST: TB Skin Test: NEGATIVE

## 2012-07-05 ENCOUNTER — Encounter: Payer: Self-pay | Admitting: Gastroenterology

## 2012-07-05 ENCOUNTER — Ambulatory Visit (AMBULATORY_SURGERY_CENTER): Payer: Medicare Other | Admitting: *Deleted

## 2012-07-05 VITALS — Ht 70.0 in | Wt 180.0 lb

## 2012-07-05 DIAGNOSIS — Z1211 Encounter for screening for malignant neoplasm of colon: Secondary | ICD-10-CM

## 2012-07-05 MED ORDER — MOVIPREP 100 G PO SOLR: ORAL | Status: DC

## 2012-07-18 ENCOUNTER — Ambulatory Visit (AMBULATORY_SURGERY_CENTER): Payer: Medicare Other | Admitting: Gastroenterology

## 2012-07-18 ENCOUNTER — Encounter: Payer: Self-pay | Admitting: Gastroenterology

## 2012-07-18 VITALS — BP 119/60 | HR 74 | Temp 97.8°F | Resp 21 | Ht 70.0 in | Wt 180.0 lb

## 2012-07-18 DIAGNOSIS — K5730 Diverticulosis of large intestine without perforation or abscess without bleeding: Secondary | ICD-10-CM

## 2012-07-18 DIAGNOSIS — Z1211 Encounter for screening for malignant neoplasm of colon: Secondary | ICD-10-CM

## 2012-07-18 MED ORDER — SODIUM CHLORIDE 0.9 % IV SOLN: 500.0000 mL | INTRAVENOUS | Status: DC

## 2012-07-18 NOTE — Patient Instructions (Addendum)
Discharge instructions given with verbal understanding. Handouts on diverticulosis and a high fiber diet given. Resume previous medications.YOU HAD AN ENDOSCOPIC PROCEDURE TODAY AT THE [facility]: Refer to the procedure report that was given to you for any specific questions about what was found during the examination.  If the procedure report does not answer your questions, please call your gastroenterologist to clarify.  If you requested that your care partner not be given the details of your procedure findings, then the procedure report has been included in a sealed envelope for you to review at your convenience later.  YOU SHOULD EXPECT: Some feelings of bloating in the abdomen. Passage of more gas than usual.  Walking can help get rid of the air that was put into your GI tract during the procedure and reduce the bloating. If you had a lower endoscopy (such as a colonoscopy or flexible sigmoidoscopy) you may notice spotting of blood in your stool or on the toilet paper. If you underwent a bowel prep for your procedure, then you may not have a normal bowel movement for a few days.  DIET: Your first meal following the procedure should be a light meal and then it is ok to progress to your normal diet.  A half-sandwich or bowl of soup is an example of a good first meal.  Heavy or fried foods are harder to digest and may make you feel nauseous or bloated.  Likewise meals heavy in dairy and vegetables can cause extra gas to form and this can also increase the bloating.  Drink plenty of fluids but you should avoid alcoholic beverages for 24 hours.  ACTIVITY: Your care partner should take you home directly after the procedure.  You should plan to take it easy, moving slowly for the rest of the day.  You can resume normal activity the day after the procedure however you should NOT DRIVE or use heavy machinery for 24 hours (because of the sedation medicines used during the test).    SYMPTOMS TO  REPORT IMMEDIATELY: A gastroenterologist can be reached at any hour.  During normal business hours, 8:30 AM to 5:00 PM Monday through Friday, call [phone].  After hours and on weekends, please call the GI answering service at [phone] who will take a message and have the physician on call contact you.   Following lower endoscopy (colonoscopy or flexible sigmoidoscopy):  Excessive amounts of blood in the stool  Significant tenderness or worsening of abdominal pains  Swelling of the abdomen that is new, acute  Fever of 100F or higher  FOLLOW UP: If any biopsies were taken you will be contacted by phone or by letter within the next 1-3 weeks.  Call your gastroenterologist if you have not heard about the biopsies in 3 weeks.  Our staff will call the home number listed on your records the next business day following your procedure to check on you and address any questions or concerns that you may have at that time regarding the information given to you following your procedure. This is a courtesy call and so if there is no answer at the home number and we have not heard from you through the emergency physician on call, we will assume that you have returned to your regular daily activities without incident.  SIGNATURES/CONFIDENTIALITY: You and/or your care partner have signed paperwork which will be entered into your electronic medical record.  These signatures attest to the fact that that the information above on your   After Visit Summary has been reviewed and is understood.  Full responsibility of the confidentiality of this discharge information lies with you and/or your care-partner.   

## 2012-07-18 NOTE — Op Note (Signed)
[facility] [address] [place], [place]  [address]  COLONOSCOPY PROCEDURE REPORT  PATIENT:  Curtis, Jimenez  MR#:  [id number] BIRTHDATE:  [birth year], 75 yrs. old  GENDER:  male ENDOSCOPIST:  Loralee Pacas. Sharlett Iles, MD, [facility] REF. BY:  Phoebe Sharps, M.D. PROCEDURE DATE:  07/18/2012 PROCEDURE:  Diagnostic Colonoscopy ASA CLASS:  Class III INDICATIONS:  pre-renal transplant evaluation. MEDICATIONS:   propofol (Diprivan) 150 mg IV  DESCRIPTION OF PROCEDURE:   After the risks and benefits and of the procedure were explained, informed consent was obtained. Digital rectal exam was performed and revealed no abnormalities. The LB [id number] endoscope was introduced through the anus and advanced to the cecum, which was identified by both the appendix and ileocecal valve.  The quality of the prep was adequate, using MoviPrep.  The instrument was then slowly withdrawn as the colon was fully examined. <<PROCEDUREIMAGES>>  FINDINGS:  Severe diverticulosis was found in the sigmoid to descending colon segments.  No polyps or cancers were seen. very,very redundant and tortuous colon.DIFFICULT EXAM !!!  no active bleeding or blood in c.  This was otherwise a normal examination of the colon.   Retroflexed views in the rectum revealed no abnormalities.    The scope was then withdrawn from the patient and the procedure completed.  COMPLICATIONS:  None ENDOSCOPIC IMPRESSION: 1) Severe diverticulosis in the sigmoid to descending colon segments 2) No polyps or cancers 3) No active bleeding or blood in c 4) Otherwise normal examination RECOMMENDATIONS: 1) High fiber diet 2) metamucil or benefiber  REPEAT EXAM:  No  ______________________________ Loralee Pacas. Sharlett Iles, MD, [facility]  CC:  n. eSIGNED:   Loralee Pacas. Raiden Yearwood at 07/18/2012 11:56 AM  Lin Landsman, [id number]

## 2012-07-18 NOTE — Progress Notes (Signed)
Pressure applied to the abdomen to reach the cecum 

## 2012-07-18 NOTE — Progress Notes (Signed)
Patient did not experience any of the following events: a burn prior to discharge; a fall within the facility; wrong site/side/patient/procedure/implant event; or a hospital transfer or hospital admission upon discharge from the facility. (G8907) Patient did not have preoperative order for IV antibiotic SSI prophylaxis. (G8918)  

## 2012-07-19 ENCOUNTER — Telehealth: Payer: Self-pay

## 2012-07-19 NOTE — Telephone Encounter (Signed)
  Follow up Call-  Call back number 07/18/2012  Post procedure Call Back phone  # [phone]  Permission to leave phone message Yes     Patient questions:  Do you have a fever, pain , or abdominal swelling? no Pain Score  0 *  Have you tolerated food without any problems? yes  Have you been able to return to your normal activities? yes  Do you have any questions about your discharge instructions: Diet   no Medications  no Follow up visit  no  Do you have questions or concerns about your Care? no  Actions: * If pain score is 4 or above: No action needed, pain <4.

## 2012-07-19 NOTE — Telephone Encounter (Signed)
Done a second time by mistake.

## 2012-08-01 LAB — BASIC METABOLIC PANEL: Creatinine: 3.8 mg/dL — AB (ref <=1.3)

## 2012-08-15 ENCOUNTER — Encounter: Payer: Self-pay | Admitting: Internal Medicine

## 2012-08-15 ENCOUNTER — Telehealth: Payer: Self-pay | Admitting: Internal Medicine

## 2012-08-15 ENCOUNTER — Ambulatory Visit (INDEPENDENT_AMBULATORY_CARE_PROVIDER_SITE_OTHER): Payer: Medicare Other | Admitting: Internal Medicine

## 2012-08-15 VITALS — BP 136/72 | HR 72 | Temp 97.9°F | Wt 188.0 lb

## 2012-08-15 DIAGNOSIS — E785 Hyperlipidemia, unspecified: Secondary | ICD-10-CM

## 2012-08-15 DIAGNOSIS — N184 Chronic kidney disease, stage 4 (severe): Secondary | ICD-10-CM

## 2012-08-15 DIAGNOSIS — I10 Essential (primary) hypertension: Secondary | ICD-10-CM

## 2012-08-15 DIAGNOSIS — E039 Hypothyroidism, unspecified: Secondary | ICD-10-CM

## 2012-08-15 DIAGNOSIS — M109 Gout, unspecified: Secondary | ICD-10-CM

## 2012-08-15 DIAGNOSIS — Z23 Encounter for immunization: Secondary | ICD-10-CM

## 2012-08-15 MED ORDER — COLCHICINE 0.6 MG PO TABS: 0.6000 mg | ORAL_TABLET | Freq: Four times a day (QID) | ORAL | Status: DC | PRN

## 2012-08-15 NOTE — Assessment & Plan Note (Signed)
Adequate control. 

## 2012-08-15 NOTE — Assessment & Plan Note (Signed)
Tolerating simvastatin.

## 2012-08-15 NOTE — Addendum Note (Signed)
Addended by: Lisabeth Pick on: 08/15/2012 08:27 AM   Modules accepted: Orders

## 2012-08-15 NOTE — Assessment & Plan Note (Signed)
Lab Results  Component Value Date   TSH 0.62 02/15/2012   Stable on meds

## 2012-08-15 NOTE — Telephone Encounter (Signed)
Pt called and said that he was suppose to get a hep b vax and didn't rcv one. It was suppose to be the 3rd shot in series.

## 2012-08-15 NOTE — Telephone Encounter (Signed)
Pt will come back at 4:15 to have hep b and pneumovax injections

## 2012-08-15 NOTE — Assessment & Plan Note (Signed)
follwed by nephrology

## 2012-08-15 NOTE — Assessment & Plan Note (Signed)
Short course of colchicine

## 2012-08-15 NOTE — Progress Notes (Signed)
Patient ID: Curtis Jimenez, male   DOB: October 18, 1936, 76 y.o.   MRN: [id number] Renal failure-- has had AV raft placed  Gout-- recurred last week- pt increased allopurinol and it is improving (Left great toe)  htn-- tolerating meds  Past Medical History  Diagnosis Date  . GERD (gastroesophageal reflux disease)   . Hyperlipidemia   . Renal insufficiency   . Gout   . Bipolar disorder   . Thyroid disease     hypothyroidism  . BPH (benign prostatic hyperplasia)   . Arthritis   . Hypothyroidism   . Hypertension     dr swords    pcp  . Anemia   . Cancer     basal cell  . Cataract     History   Social History  . Marital Status: Married    Spouse Name: N/A    Number of Children: N/A  . Years of Education: N/A   Occupational History  . Not on file.   Social History Main Topics  . Smoking status: Former Smoker -- 16 years    Types: Pipe, Cigars    Quit date: 05/11/1976  . Smokeless tobacco: Never Used  . Alcohol Use: 0.6 oz/week    1 Glasses of wine per week     socially  . Drug Use: No  . Sexually Active: Not on file   Other Topics Concern  . Not on file   Social History Narrative  . No narrative on file    Past Surgical History  Procedure Date  . Dilation for gerd   . Av fistula placement 04/08/2012    Procedure: ARTERIOVENOUS (AV) FISTULA CREATION;  Surgeon: Angelia Mould, MD;  Location: [facility] OR;  Service: Vascular;  Laterality: Left;  Creation of left brachial cephalic arteriovenous fistula    Family History  Problem Relation Age of Onset  . Cancer Father     BLADDER  . Heart disease Father     Allergies  Allergen Reactions  . Amantadine Hcl     REACTION: itching  . Chlorpromazine Hcl     REACTION: "fell flat on face"    Current Outpatient Prescriptions on File Prior to Visit  Medication Sig Dispense Refill  . allopurinol (ZYLOPRIM) 100 MG tablet Take 300 mg by mouth daily.       . calcitRIOL (ROCALTROL) 0.25 MCG capsule Take 0.25 mcg by  mouth daily. Monday Wednesday and Friday      . colchicine 0.6 MG tablet Take 0.6 mg by mouth as needed.      . diphenhydramine-acetaminophen (TYLENOL PM) 25-500 MG TABS Take 1 tablet by mouth at bedtime as needed.      . fish oil-omega-3 fatty acids 1000 MG capsule Take 1 g by mouth 4 (four) times daily.       . furosemide (LASIX) 40 MG tablet Take 40 mg by mouth 2 (two) times daily.       . hydrOXYzine (ATARAX/VISTARIL) 25 MG tablet Take 20 mg by mouth 3 (three) times daily.       Marland Kitchen levothyroxine (SYNTHROID, LEVOTHROID) 25 MCG tablet Take 25 mcg by mouth daily.        Marland Kitchen omeprazole (PRILOSEC) 20 MG capsule Take 20 mg by mouth daily.        . risperiDONE (RISPERDAL) 2 MG tablet Take 1/2 tablet daily.      . simvastatin (ZOCOR) 40 MG tablet Take 20 mg by mouth at bedtime.  patient denies chest pain, shortness of breath, orthopnea. Denies lower extremity edema, abdominal pain, change in appetite, change in bowel movements. Patient denies rashes, musculoskeletal complaints. No other specific complaints in a complete review of systems.   BP 136/72  Pulse 72  Temp 97.9 F (36.6 C) (Oral)  Wt 188 lb (85.276 kg)  Well-developed well-nourished male in no acute distress. HEENT exam atraumatic, normocephalic, extraocular muscles are intact. Neck is supple. No jugular venous distention no thyromegaly. Chest clear to auscultation without increased work of breathing. Cardiac exam S1 and S2 are regular. Abdominal exam active bowel sounds, soft, nontender. Extremities no edema. Neurologic exam she is alert without any motor sensory deficits. Gait is normal.

## 2012-08-23 NOTE — Addendum Note (Signed)
Addended by: Townsend Roger D on: 08/23/2012 10:39 AM   Modules accepted: Orders

## 2012-09-19 ENCOUNTER — Ambulatory Visit (INDEPENDENT_AMBULATORY_CARE_PROVIDER_SITE_OTHER): Payer: Medicare Other | Admitting: Family

## 2012-09-19 ENCOUNTER — Encounter: Payer: Self-pay | Admitting: Family

## 2012-09-19 VITALS — BP 110/70 | HR 95 | Temp 98.0°F | Wt 185.0 lb

## 2012-09-19 DIAGNOSIS — R5383 Other fatigue: Secondary | ICD-10-CM

## 2012-09-19 DIAGNOSIS — N184 Chronic kidney disease, stage 4 (severe): Secondary | ICD-10-CM

## 2012-09-19 DIAGNOSIS — D649 Anemia, unspecified: Secondary | ICD-10-CM

## 2012-09-19 DIAGNOSIS — R5381 Other malaise: Secondary | ICD-10-CM

## 2012-09-19 LAB — CBC WITH DIFFERENTIAL/PLATELET
Basophils Relative: 0.9 % (ref 0.0–3.0)
Eosinophils Absolute: 0.2 10*3/uL (ref 0.0–0.7)
Eosinophils Relative: 2.7 % (ref 0.0–5.0)
HCT: 38.2 % — ABNORMAL LOW (ref 39.0–52.0)
Lymphs Abs: 1.7 10*3/uL (ref 0.7–4.0)
MCHC: 32.8 g/dL (ref 30.0–36.0)
MCV: 88.6 fl (ref 78.0–100.0)
Monocytes Absolute: 0.8 10*3/uL (ref 0.1–1.0)
Neutrophils Relative %: 66.7 % (ref 43.0–77.0)
Platelets: 279 10*3/uL (ref 150.0–400.0)
WBC: 8.3 10*3/uL (ref 4.5–10.5)

## 2012-09-19 LAB — POCT URINALYSIS DIPSTICK
Ketones, UA: NEGATIVE
Protein, UA: NEGATIVE
Spec Grav, UA: <=1.005
Urobilinogen, UA: 0.2
pH, UA: 6

## 2012-09-19 MED ORDER — POLYMYXIN B-TRIMETHOPRIM 10000-0.1 UNIT/ML-% OP SOLN: 2.0000 [drp] | Freq: Four times a day (QID) | OPHTHALMIC | Status: DC

## 2012-09-19 NOTE — Progress Notes (Signed)
Subjective:    Patient ID: Curtis Jimenez, male    DOB: 08/04/1936, 76 y.o.   MRN: [id number]  HPI 76 year old white male, nonsmoker, patient of Dr. Leanne Chang is in with complaints of fatigue x4-5 days. His symptoms have improved over the last several days. Denies any recent changes in medications. No new physical activity causing a more fatigued. He has a history of anemia and chronic kidney disease-severe. Patient reports in and out cath himself to produce urine at times. Denies any urinary frequency, urgency, blood in his urine, back pain or abdominal pain. Patient has an appointment with nephrology in 3 weeks. Last creatinine was 3.8.  Patient has complaints of left eye irritation, redness, and burning x2 days. Has allergic rhinitis, but his eye is not itchy. Minimal matting and crusting.   Review of Systems  Constitutional: Positive for fever. Negative for appetite change.  HENT: Negative.   Eyes: Positive for discharge, redness and visual disturbance. Negative for itching.  Respiratory: Negative.   Cardiovascular: Negative.   Gastrointestinal: Negative.   Musculoskeletal: Negative.   Skin: Negative.   Neurological: Negative.   Hematological: Negative.   Psychiatric/Behavioral: Negative.    Past Medical History  Diagnosis Date  . GERD (gastroesophageal reflux disease)   . Hyperlipidemia   . Renal insufficiency   . Gout   . Bipolar disorder   . Thyroid disease     hypothyroidism  . BPH (benign prostatic hyperplasia)   . Arthritis   . Hypothyroidism   . Hypertension     dr swords    pcp  . Anemia   . Cancer     basal cell  . Cataract     History   Social History  . Marital Status: Married    Spouse Name: N/A    Number of Children: N/A  . Years of Education: N/A   Occupational History  . Not on file.   Social History Main Topics  . Smoking status: Former Smoker -- 16 years    Types: Pipe, Cigars    Quit date: 05/11/1976  . Smokeless tobacco: Never Used  .  Alcohol Use: 0.6 oz/week    1 Glasses of wine per week     socially  . Drug Use: No  . Sexually Active: Not on file   Other Topics Concern  . Not on file   Social History Narrative  . No narrative on file    Past Surgical History  Procedure Date  . Dilation for gerd   . Av fistula placement 04/08/2012    Procedure: ARTERIOVENOUS (AV) FISTULA CREATION;  Surgeon: Angelia Mould, MD;  Location: [facility] OR;  Service: Vascular;  Laterality: Left;  Creation of left brachial cephalic arteriovenous fistula    Family History  Problem Relation Age of Onset  . Cancer Father     BLADDER  . Heart disease Father     Allergies  Allergen Reactions  . Amantadine Hcl     REACTION: itching  . Chlorpromazine Hcl     REACTION: "fell flat on face"    Current Outpatient Prescriptions on File Prior to Visit  Medication Sig Dispense Refill  . allopurinol (ZYLOPRIM) 100 MG tablet Take 300 mg by mouth daily.       . calcitRIOL (ROCALTROL) 0.25 MCG capsule Take 0.25 mcg by mouth daily. Monday Wednesday and Friday      . clonazePAM (KLONOPIN) 2 MG tablet Take 2 mg by mouth daily as needed.      . colchicine  0.6 MG tablet Take 1 tablet (0.6 mg total) by mouth 4 (four) times daily as needed.  20 tablet  3  . diphenhydramine-acetaminophen (TYLENOL PM) 25-500 MG TABS Take 1 tablet by mouth at bedtime as needed.      . fish oil-omega-3 fatty acids 1000 MG capsule Take 1 g by mouth 4 (four) times daily.       . furosemide (LASIX) 40 MG tablet Take 40 mg by mouth 2 (two) times daily.       . hydrOXYzine (ATARAX/VISTARIL) 25 MG tablet Take 20 mg by mouth 3 (three) times daily.       Marland Kitchen levothyroxine (SYNTHROID, LEVOTHROID) 25 MCG tablet Take 25 mcg by mouth daily.        Marland Kitchen omeprazole (PRILOSEC) 20 MG capsule Take 20 mg by mouth daily.        . risperiDONE (RISPERDAL) 2 MG tablet Take 1/2 tablet daily.      . simvastatin (ZOCOR) 40 MG tablet Take 20 mg by mouth at bedtime.          BP 110/70  Pulse 95   Temp 98 F (36.7 C) (Oral)  Wt 185 lb (83.915 kg)  SpO2 95%chart    Objective:   Physical Exam  Constitutional: He is oriented to person, place, and time. He appears well-developed and well-nourished.  HENT:  Right Ear: External ear normal.  Left Ear: External ear normal.  Nose: Nose normal.  Mouth/Throat: Oropharynx is clear and moist.  Eyes: Pupils are equal, round, and reactive to light.       Left eye is red and conjunctiva ejected.   Neck: Normal range of motion. Neck supple.  Cardiovascular: Normal rate, regular rhythm and normal heart sounds.   Pulmonary/Chest: Effort normal and breath sounds normal.  Abdominal: Soft. Bowel sounds are normal.  Musculoskeletal: Normal range of motion.  Neurological: He is alert and oriented to person, place, and time.  Skin: Skin is warm and dry.  Psychiatric: He has a normal mood and affect.          Assessment & Plan:  Assessment: Fatigue, conjunctivitis, chronic kidney disease-severe  Plan: Lab sent to include CMP, CBC, UA, TSH notify patient pending results. We'll followup with patient pending results, as scheduled, and when necessary. Polytrim ophthalmic drops 2 drops in the left eye every 6 hours.

## 2012-09-19 NOTE — Patient Instructions (Addendum)

## 2012-09-20 LAB — COMPREHENSIVE METABOLIC PANEL
AST: 28 U/L (ref 0–37)
Alkaline Phosphatase: 69 U/L (ref 39–117)
BUN: 46 mg/dL — ABNORMAL HIGH (ref 6–23)
Glucose, Bld: 90 mg/dL (ref 70–99)
Potassium: 3.8 mEq/L (ref 3.5–5.1)
Sodium: 146 mEq/L — ABNORMAL HIGH (ref 135–145)
Total Bilirubin: 0.5 mg/dL (ref 0.3–1.2)

## 2012-09-30 NOTE — Telephone Encounter (Signed)
Opened in error

## 2012-11-06 DIAGNOSIS — Z940 Kidney transplant status: Secondary | ICD-10-CM | POA: Insufficient documentation

## 2012-11-06 HISTORY — PX: KIDNEY TRANSPLANT: SHX239

## 2012-12-16 ENCOUNTER — Ambulatory Visit: Payer: Medicare Other | Admitting: Internal Medicine

## 2012-12-23 ENCOUNTER — Encounter: Payer: Self-pay | Admitting: Internal Medicine

## 2012-12-23 ENCOUNTER — Ambulatory Visit (INDEPENDENT_AMBULATORY_CARE_PROVIDER_SITE_OTHER): Payer: Medicare Other | Admitting: Internal Medicine

## 2012-12-23 VITALS — BP 130/78 | HR 88 | Temp 98.0°F | Ht 70.0 in | Wt 188.0 lb

## 2012-12-23 DIAGNOSIS — N184 Chronic kidney disease, stage 4 (severe): Secondary | ICD-10-CM

## 2012-12-23 DIAGNOSIS — I10 Essential (primary) hypertension: Secondary | ICD-10-CM

## 2012-12-23 DIAGNOSIS — E785 Hyperlipidemia, unspecified: Secondary | ICD-10-CM

## 2012-12-23 DIAGNOSIS — Z940 Kidney transplant status: Secondary | ICD-10-CM

## 2012-12-23 NOTE — Progress Notes (Signed)
Patient ID: Curtis Jimenez, male   DOB: Jun 13, 1936, 76 y.o.   MRN: [id number] S/P- renal transplant- doing well, feels great- he tells me CRT=1.8 mg/dl  Lipids- tolerating meds Gout-no recurrence (last attack= 6 months ago) gerd- tolerating meds-   Reviewed pmh, psh, soc hx   patient denies chest pain, shortness of breath, orthopnea. Denies lower extremity edema, abdominal pain, change in appetite, change in bowel movements. Patient denies rashes, musculoskeletal complaints. No other specific complaints in a complete review of systems.    well-developed well-nourished male in no acute distress. HEENT exam atraumatic, normocephalic, neck supple without jugular venous distention. Chest clear to auscultation cardiac exam S1-S2 are regular. Abdominal exam overweight with bowel sounds, soft and nontender. Extremities no edema. Neurologic exam is alert with a normal gait.

## 2012-12-23 NOTE — Assessment & Plan Note (Signed)
Lipid Panel     Component Value Date/Time   CHOL 127 02/15/2012 1023   TRIG 151.0* 02/15/2012 1023   HDL 35.10* 02/15/2012 1023   CHOLHDL 4 02/15/2012 1023   VLDL 30.2 02/15/2012 1023   LDLCALC 62 02/15/2012 1023    Followed at [facility]

## 2012-12-23 NOTE — Assessment & Plan Note (Signed)
S/p transplant- Feels great Labs per transplant team

## 2012-12-23 NOTE — Assessment & Plan Note (Addendum)
Adequate control in the office, but it sounds like home bps are higher. He has f/u with transplant team within one week. Tolerating coreg

## 2013-03-10 ENCOUNTER — Encounter: Payer: Self-pay | Admitting: Family Medicine

## 2013-03-10 ENCOUNTER — Ambulatory Visit (INDEPENDENT_AMBULATORY_CARE_PROVIDER_SITE_OTHER): Payer: Medicare Other | Admitting: Family Medicine

## 2013-03-10 VITALS — BP 120/68 | HR 91 | Temp 98.9°F | Wt 193.0 lb

## 2013-03-10 DIAGNOSIS — R61 Generalized hyperhidrosis: Secondary | ICD-10-CM

## 2013-03-10 NOTE — Progress Notes (Signed)
Chief Complaint  Patient presents with  . Night Sweats    x 2 months; thursday night worse     HPI:  Acute visit for night sweats: -reports maybe on and off for a long time, was occuring long before transplant last year, sweated again last week and this week - told his nephrologist about this and did some lab testing and wonders if should increase synthroid (Dr. Everlene Farrier - at [facility] in [place]) - but he hasn't heard about these results, has a follow up in a few weeks. Also sees his tranplant doctor in 2 weeks -reports nephrologist checked TSH, a bunch of other labs and urine - but he wonders if we can get the results -his nephrologist and transplant doctors do a lot of labs every time he sees them and he is seeing htem both in the next two weeks -doesn't think it is the temperature -Denies: CP, SOB, coughing, weight loss (actually has gained weight), dizziness, fevers, sinus issues, bowel changes, anorexia, vomiting, urinary symptoms, bleeding  ROS: See pertinent positives and negatives per HPI.  Past Medical History  Diagnosis Date  . GERD (gastroesophageal reflux disease)   . Hyperlipidemia   . Renal insufficiency   . Gout   . Bipolar disorder   . Thyroid disease     hypothyroidism  . BPH (benign prostatic hyperplasia)   . Arthritis   . Hypothyroidism   . Hypertension     dr swords    pcp  . Anemia   . Cancer     basal cell  . Cataract     Family History  Problem Relation Age of Onset  . Cancer Father     BLADDER  . Heart disease Father     History   Social History  . Marital Status: Married    Spouse Name: N/A    Number of Children: N/A  . Years of Education: N/A   Social History Main Topics  . Smoking status: Former Smoker -- 16 years    Types: Pipe, Cigars    Quit date: 05/11/1976  . Smokeless tobacco: Never Used  . Alcohol Use: 0.6 oz/week    1 Glasses of wine per week     Comment: socially  . Drug Use: No  . Sexually Active: None   Other Topics Concern   . None   Social History Narrative  . None    Current outpatient prescriptions:allopurinol (ZYLOPRIM) 100 MG tablet, Take 300 mg by mouth daily. , Disp: , Rfl: ;  carvedilol (COREG) 6.25 MG tablet, Take 6.25 mg by mouth 2 (two) times daily with a meal., Disp: , Rfl: ;  colchicine 0.6 MG tablet, Take 1 tablet (0.6 mg total) by mouth 4 (four) times daily as needed., Disp: 20 tablet, Rfl: 3;  hydrOXYzine (ATARAX/VISTARIL) 25 MG tablet, Take 20 mg by mouth 3 (three) times daily. , Disp: , Rfl:  levothyroxine (SYNTHROID, LEVOTHROID) 25 MCG tablet, Take 25 mcg by mouth daily.  , Disp: , Rfl: ;  mycophenolate (MYFORTIC) 180 MG EC tablet, Take 720 mg by mouth daily. , Disp: , Rfl: ;  omeprazole (PRILOSEC) 20 MG capsule, Take 20 mg by mouth daily.  , Disp: , Rfl: ;  risperiDONE (RISPERDAL) 2 MG tablet, Take 1/2 tablet daily., Disp: , Rfl: ;  simvastatin (ZOCOR) 40 MG tablet, Take 20 mg by mouth at bedtime.  , Disp: , Rfl:  tacrolimus (PROGRAF) 1 MG capsule, Take 1 mg by mouth 2 (two) times daily., Disp: , Rfl:  EXAM:  Filed Vitals:   03/10/13 0916  BP: 120/68  Pulse: 91  Temp: 98.9 F (37.2 C)    Body mass index is 27.69 kg/(m^2).  GENERAL: vitals reviewed and listed above, alert, oriented, appears well hydrated and in no acute distress  HEENT: atraumatic, conjunttiva clear, no obvious abnormalities on inspection of external nose and ears  NECK: no obvious masses on inspection  LUNGS: clear to auscultation bilaterally, no wheezes, rales or rhonchi, good air movement  CV: HRRR, no peripheral edema  MS: moves all extremities without noticeable abnormality  PSYCH: pleasant and cooperative, no obvious depression or anxiety  ASSESSMENT AND PLAN:  Discussed the following assessment and plan:  Chronic night sweats -ongoing for a long time and prior to his transplant - has had extensive labs and workups prior to transplant and recently with his nephrologist - no other symptoms to suggest  infection, cancer, etc - may be related to medications or thyroid -feels great otherwise -he basically came in today to get results of labs done by [facility] nephrologist as he reports they are slow getting back to him and he wants to know if synthroid dose is correct -advised him to contact his nephrologist - which he had not done and he has follow up with his nephrologist and transplant doc in the next two weeks -will get labs from [place] faxed here to scan in for PCP to have a follow up -discussed causes of sweating - and advised cool cotton PJs, and bedding and follow up with nephrology, transplant team and PCP -Patient advised to return or notify a doctor immediately if symptoms worsen or persist or new concerns arise.  There are no Patient Instructions on file for this visit.   Colin Benton R.

## 2013-06-23 ENCOUNTER — Ambulatory Visit: Payer: Medicare Other | Admitting: Internal Medicine

## 2013-06-26 ENCOUNTER — Ambulatory Visit (INDEPENDENT_AMBULATORY_CARE_PROVIDER_SITE_OTHER): Payer: Medicare Other | Admitting: Internal Medicine

## 2013-06-26 ENCOUNTER — Encounter: Payer: Self-pay | Admitting: Internal Medicine

## 2013-06-26 VITALS — BP 112/64 | HR 72 | Temp 98.2°F | Wt 199.0 lb

## 2013-06-26 DIAGNOSIS — F319 Bipolar disorder, unspecified: Secondary | ICD-10-CM

## 2013-06-26 DIAGNOSIS — Z940 Kidney transplant status: Secondary | ICD-10-CM

## 2013-06-26 NOTE — Progress Notes (Signed)
Patient ID: Curtis Jimenez, male   DOB: 06/09/1936, 77 y.o.   MRN: [id number]  Renal transplant- followed by nephrology. He tells me crt = 1.4-1.8  Gout- no recurrence  Lipids- tolerating meds  Hypothyroid-- followed by nephrology  Past Medical History  Diagnosis Date  . GERD (gastroesophageal reflux disease)   . Hyperlipidemia   . Renal insufficiency   . Gout   . Bipolar disorder   . Thyroid disease     hypothyroidism  . BPH (benign prostatic hyperplasia)   . Arthritis   . Hypothyroidism   . Hypertension     dr swords    pcp  . Anemia   . Cancer     basal cell  . Cataract     History   Social History  . Marital Status: Married    Spouse Name: N/A    Number of Children: N/A  . Years of Education: N/A   Occupational History  . Not on file.   Social History Main Topics  . Smoking status: Former Smoker -- 16 years    Types: Pipe, Cigars    Quit date: 05/11/1976  . Smokeless tobacco: Never Used  . Alcohol Use: 0.6 oz/week    1 Glasses of wine per week     Comment: socially  . Drug Use: No  . Sexually Active: Not on file   Other Topics Concern  . Not on file   Social History Narrative  . No narrative on file    Past Surgical History  Procedure Laterality Date  . Dilation for gerd    . Av fistula placement  04/08/2012    Procedure: ARTERIOVENOUS (AV) FISTULA CREATION;  Surgeon: Angelia Mould, MD;  Location: [facility] OR;  Service: Vascular;  Laterality: Left;  Creation of left brachial cephalic arteriovenous fistula  . Kidney transplant  11/06/12    [facility]    Family History  Problem Relation Age of Onset  . Cancer Father     BLADDER  . Heart disease Father     Allergies  Allergen Reactions  . Amantadine Hcl     REACTION: itching  . Chlorpromazine Hcl     REACTION: "fell flat on face"    Current Outpatient Prescriptions on File Prior to Visit  Medication Sig Dispense Refill  . allopurinol (ZYLOPRIM) 100 MG tablet Take 300 mg by mouth  daily.       . carvedilol (COREG) 6.25 MG tablet Take 6.25 mg by mouth 2 (two) times daily with a meal.      . colchicine 0.6 MG tablet Take 1 tablet (0.6 mg total) by mouth 4 (four) times daily as needed.  20 tablet  3  . hydrOXYzine (ATARAX/VISTARIL) 25 MG tablet Take 20 mg by mouth 3 (three) times daily.       Marland Kitchen levothyroxine (SYNTHROID, LEVOTHROID) 25 MCG tablet Take 25 mcg by mouth daily.        . mycophenolate (MYFORTIC) 180 MG EC tablet Take 720 mg by mouth daily.       Marland Kitchen omeprazole (PRILOSEC) 20 MG capsule Take 20 mg by mouth daily.        . risperiDONE (RISPERDAL) 2 MG tablet Take 1/2 tablet daily.      . simvastatin (ZOCOR) 40 MG tablet Take 20 mg by mouth at bedtime.        . tacrolimus (PROGRAF) 1 MG capsule Take 1.5 mg by mouth 2 (two) times daily.        No current facility-administered  medications on file prior to visit.     patient denies chest pain, shortness of breath, orthopnea. Denies lower extremity edema, abdominal pain, change in appetite, change in bowel movements. Patient denies rashes, musculoskeletal complaints. No other specific complaints in a complete review of systems.   BP 112/64  Pulse 72  Temp(Src) 98.2 F (36.8 C) (Oral)  Wt 199 lb (90.266 kg)  BMI 28.55 kg/m2   well-developed well-nourished male in no acute distress. HEENT exam atraumatic, normocephalic, neck supple without jugular venous distention. Chest clear to auscultation cardiac exam S1-S2 are regular. Abdominal exam overweight with bowel sounds, soft and nontender. Extremities no edema. Neurologic exam is alert with a normal gait.  Problems include renal failure status post renal transplant. Hyperlipidemia, gout, hypothyroidism.  Patient tells me that all of his laboratory work is followed by his nephrologist both at the [facility] and with his local nephrologist. I will not repeat laboratories at this time. Reviewed labs today. And I have asked themm to be scanned into the chart.

## 2013-06-26 NOTE — Assessment & Plan Note (Signed)
Well controlled 

## 2013-06-26 NOTE — Assessment & Plan Note (Signed)
follwed by dr. Mercy Moore and at [facility]

## 2013-07-04 ENCOUNTER — Encounter: Payer: Self-pay | Admitting: Internal Medicine

## 2013-08-17 ENCOUNTER — Ambulatory Visit (INDEPENDENT_AMBULATORY_CARE_PROVIDER_SITE_OTHER): Payer: Medicare Other | Admitting: Family

## 2013-08-17 ENCOUNTER — Encounter: Payer: Self-pay | Admitting: Family

## 2013-08-17 VITALS — BP 118/74 | HR 83 | Wt 202.0 lb

## 2013-08-17 DIAGNOSIS — E039 Hypothyroidism, unspecified: Secondary | ICD-10-CM

## 2013-08-17 DIAGNOSIS — N390 Urinary tract infection, site not specified: Secondary | ICD-10-CM

## 2013-08-17 DIAGNOSIS — I10 Essential (primary) hypertension: Secondary | ICD-10-CM

## 2013-08-17 DIAGNOSIS — Z940 Kidney transplant status: Secondary | ICD-10-CM

## 2013-08-17 LAB — POCT URINALYSIS DIPSTICK
Bilirubin, UA: NEGATIVE
Ketones, UA: NEGATIVE
Leukocytes, UA: NEGATIVE
Spec Grav, UA: 1.01

## 2013-08-17 NOTE — Progress Notes (Signed)
Subjective:    Patient ID: Curtis Jimenez, male    DOB: [birth year], 77 y.o.   MRN: [id number]  HPI  77 year old male patient of Dr. Leanne Chang, status post renal transplant November 2013, in today for recheck of urinary tract infection found on routine screening by nephrology at the [facility]. He has been taking Cipro 250 mg twice a day x4 days. Denies any fever, chills, abdominal pain or back pain.   Review of Systems  Constitutional: Negative.   HENT: Negative.   Respiratory: Negative.   Cardiovascular: Negative.   Gastrointestinal: Negative.   Genitourinary: Negative.   Musculoskeletal: Negative.   Skin: Negative.   Neurological: Negative.   Hematological: Negative.   Psychiatric/Behavioral: Negative.    Past Medical History  Diagnosis Date  . GERD (gastroesophageal reflux disease)   . Hyperlipidemia   . Renal insufficiency   . Gout   . Bipolar disorder   . Thyroid disease     hypothyroidism  . BPH (benign prostatic hyperplasia)   . Arthritis   . Hypothyroidism   . Hypertension     dr swords    pcp  . Anemia   . Cancer     basal cell  . Cataract     History   Social History  . Marital Status: Married    Spouse Name: N/A    Number of Children: N/A  . Years of Education: N/A   Occupational History  . Not on file.   Social History Main Topics  . Smoking status: Former Smoker -- 16 years    Types: Pipe, Cigars    Quit date: 05/11/1976  . Smokeless tobacco: Never Used  . Alcohol Use: 0.6 oz/week    1 Glasses of wine per week     Comment: socially  . Drug Use: No  . Sexual Activity: Not on file   Other Topics Concern  . Not on file   Social History Narrative  . No narrative on file    Past Surgical History  Procedure Laterality Date  . Dilation for gerd    . Av fistula placement  04/08/2012    Procedure: ARTERIOVENOUS (AV) FISTULA CREATION;  Surgeon: Angelia Mould, MD;  Location: [facility] OR;  Service: Vascular;  Laterality: Left;  Creation of left  brachial cephalic arteriovenous fistula  . Kidney transplant  11/06/12    [facility]    Family History  Problem Relation Age of Onset  . Cancer Father     BLADDER  . Heart disease Father     Allergies  Allergen Reactions  . Amantadine Hcl     REACTION: itching  . Chlorpromazine Hcl     REACTION: "fell flat on face"    Current Outpatient Prescriptions on File Prior to Visit  Medication Sig Dispense Refill  . allopurinol (ZYLOPRIM) 100 MG tablet Take 300 mg by mouth daily.       Marland Kitchen aspirin EC 81 MG tablet Take 81 mg by mouth daily. Take 1 tablet (81 mg total) by mouth daily.      . carvedilol (COREG) 6.25 MG tablet Take 6.25 mg by mouth 2 (two) times daily with a meal.      . colchicine 0.6 MG tablet Take 1 tablet (0.6 mg total) by mouth 4 (four) times daily as needed.  20 tablet  3  . hydrOXYzine (ATARAX/VISTARIL) 25 MG tablet Take 20 mg by mouth 3 (three) times daily.       Marland Kitchen levothyroxine (SYNTHROID, LEVOTHROID) 25 MCG tablet  Take 25 mcg by mouth daily.        . mycophenolate (MYFORTIC) 180 MG EC tablet Take 720 mg by mouth daily.       Marland Kitchen omeprazole (PRILOSEC) 20 MG capsule Take 20 mg by mouth daily.        . risperiDONE (RISPERDAL) 2 MG tablet Take 1/2 tablet daily.      . simvastatin (ZOCOR) 40 MG tablet Take 20 mg by mouth at bedtime.        . tacrolimus (PROGRAF) 1 MG capsule Take 1.5 mg by mouth 2 (two) times daily.       Marland Kitchen amLODipine (NORVASC) 5 MG tablet Take 1 tablet by mouth daily. Take 5 mg every other day.      . Omega-3 1000 MG CAPS Take 1 capsule by mouth daily. Take 2 g by mouth 2 (two) times daily as needed.       No current facility-administered medications on file prior to visit.    BP 118/74  Pulse 83  Wt 202 lb (91.627 kg)  BMI 28.98 kg/m2chart    Objective:   Physical Exam  Constitutional: He is oriented to person, place, and time. He appears well-developed and well-nourished.  HENT:  Right Ear: External ear normal.  Left Ear: External ear normal.   Nose: Nose normal.  Mouth/Throat: Oropharynx is clear and moist.  Neck: Normal range of motion. Neck supple.  Cardiovascular: Normal rate, regular rhythm and normal heart sounds.   Pulmonary/Chest: Effort normal and breath sounds normal.  Musculoskeletal: Normal range of motion.  Neurological: He is alert and oriented to person, place, and time.  Skin: Skin is warm and dry.          Assessment & Plan:  Assessment: 1. UTI 2.s/p renal transplant 3. HTN  Plan: At followup as scheduled and as needed. Urine culture sent.

## 2013-08-19 LAB — URINE CULTURE
Colony Count: NO GROWTH
Organism ID, Bacteria: NO GROWTH

## 2013-09-05 ENCOUNTER — Ambulatory Visit (INDEPENDENT_AMBULATORY_CARE_PROVIDER_SITE_OTHER)
Admission: RE | Admit: 2013-09-05 | Discharge: 2013-09-05 | Disposition: A | Discharge status: Home / Self Care | Payer: Medicare Other | Source: Ambulatory Visit | Attending: Family | Admitting: Family

## 2013-09-05 ENCOUNTER — Encounter: Payer: Self-pay | Admitting: Family

## 2013-09-05 ENCOUNTER — Ambulatory Visit (INDEPENDENT_AMBULATORY_CARE_PROVIDER_SITE_OTHER): Payer: Medicare Other | Admitting: Family

## 2013-09-05 VITALS — BP 130/72 | HR 76 | Wt 201.0 lb

## 2013-09-05 DIAGNOSIS — M25562 Pain in left knee: Secondary | ICD-10-CM

## 2013-09-05 DIAGNOSIS — M25569 Pain in unspecified knee: Secondary | ICD-10-CM

## 2013-09-05 DIAGNOSIS — W010XXA Fall on same level from slipping, tripping and stumbling without subsequent striking against object, initial encounter: Secondary | ICD-10-CM

## 2013-09-05 MED ORDER — ALLOPURINOL 100 MG PO TABS: 300.0000 mg | ORAL_TABLET | Freq: Every day | ORAL | Status: DC

## 2013-09-05 NOTE — Progress Notes (Signed)
Subjective:    Patient ID: Curtis Jimenez, male    DOB: 1936/12/27, 77 y.o.   MRN: [id number]  HPI 77 year old white male, nonsmoker, patient of Dr. Arnoldo Morale is in today with complaints of left knee pain after a fall 3 days ago. Patient reports stepping out of a car, his knee giving out and falling to the ground. Denies any previous pain, injury to the left knee prior to the fall. He now has stiffness and pain that he rates a 3/10. Has been elevating his leg that has helped. Swelling is improved. Denies any lightheadedness, dizziness, chest pain, palpitations, shortness of breath or edema. He has a history of osteoarthritis of the left hip.   Review of Systems  Constitutional: Negative.   Respiratory: Negative.  Negative for shortness of breath.   Cardiovascular: Negative.  Negative for palpitations.  Gastrointestinal: Negative.   Musculoskeletal: Negative for myalgias and back pain.       Left knee pain and weakness  Skin: Negative.   Neurological: Negative.  Negative for light-headedness.  Psychiatric/Behavioral: Negative.    Past Medical History  Diagnosis Date  . GERD (gastroesophageal reflux disease)   . Hyperlipidemia   . Renal insufficiency   . Gout   . Bipolar disorder   . Thyroid disease     hypothyroidism  . BPH (benign prostatic hyperplasia)   . Arthritis   . Hypothyroidism   . Hypertension     dr swords    pcp  . Anemia   . Cancer     basal cell  . Cataract     History   Social History  . Marital Status: Married    Spouse Name: N/A    Number of Children: N/A  . Years of Education: N/A   Occupational History  . Not on file.   Social History Main Topics  . Smoking status: Former Smoker -- 16 years    Types: Pipe, Cigars    Quit date: 05/11/1976  . Smokeless tobacco: Never Used  . Alcohol Use: 0.6 oz/week    1 Glasses of wine per week     Comment: socially  . Drug Use: No  . Sexual Activity: Not on file   Other Topics Concern  . Not on file    Social History Narrative  . No narrative on file    Past Surgical History  Procedure Laterality Date  . Dilation for gerd    . Av fistula placement  04/08/2012    Procedure: ARTERIOVENOUS (AV) FISTULA CREATION;  Surgeon: Angelia Mould, MD;  Location: [facility] OR;  Service: Vascular;  Laterality: Left;  Creation of left brachial cephalic arteriovenous fistula  . Kidney transplant  11/06/12    [facility]    Family History  Problem Relation Age of Onset  . Cancer Father     BLADDER  . Heart disease Father     Allergies  Allergen Reactions  . Amantadine Hcl     REACTION: itching  . Chlorpromazine Hcl     REACTION: "fell flat on face"    Current Outpatient Prescriptions on File Prior to Visit  Medication Sig Dispense Refill  . amLODipine (NORVASC) 5 MG tablet Take 1 tablet by mouth daily. Take 5 mg every other day.      Marland Kitchen aspirin EC 81 MG tablet Take 81 mg by mouth daily. Take 1 tablet (81 mg total) by mouth daily.      . carvedilol (COREG) 6.25 MG tablet Take 6.25 mg by mouth  2 (two) times daily with a meal.      . colchicine 0.6 MG tablet Take 1 tablet (0.6 mg total) by mouth 4 (four) times daily as needed.  20 tablet  3  . hydrOXYzine (ATARAX/VISTARIL) 25 MG tablet Take 20 mg by mouth 3 (three) times daily.       Marland Kitchen levothyroxine (SYNTHROID, LEVOTHROID) 25 MCG tablet Take 25 mcg by mouth daily.        . mycophenolate (MYFORTIC) 180 MG EC tablet Take 720 mg by mouth daily.       . Omega-3 1000 MG CAPS Take 1 capsule by mouth daily. Take 2 g by mouth 2 (two) times daily as needed.      Marland Kitchen omeprazole (PRILOSEC) 20 MG capsule Take 20 mg by mouth daily.        . risperiDONE (RISPERDAL) 2 MG tablet Take 1/2 tablet daily.      . simvastatin (ZOCOR) 40 MG tablet Take 20 mg by mouth at bedtime.        . tacrolimus (PROGRAF) 1 MG capsule Take 1.5 mg by mouth 2 (two) times daily.        No current facility-administered medications on file prior to visit.    BP 130/72  Pulse 76  Wt  201 lb (91.173 kg)  BMI 28.84 kg/m2chart    Objective:   Physical Exam  Constitutional: He is oriented to person, place, and time. He appears well-developed and well-nourished.  HENT:  Right Ear: External ear normal.  Left Ear: External ear normal.  Neck: Normal range of motion.  Cardiovascular: Normal rate, regular rhythm and normal heart sounds.   Pulmonary/Chest: Effort normal and breath sounds normal.  Musculoskeletal: He exhibits edema and tenderness.  Mild swelling to the left knee. Pain with flexion. Mild crepitus. No open alone.  Neurological: He is alert and oriented to person, place, and time. He displays normal reflexes. No cranial nerve deficit. Coordination normal.  Skin: Skin is warm and dry.  Psychiatric: He has a normal mood and affect.          Assessment & Plan:  Assessment: 1. Osteoarthritis 2. Left knee pain 3. Fall  Plan: X-ray of the left knee will notify patient of results and discuss further treatment plan thereafter.

## 2013-09-05 NOTE — Patient Instructions (Signed)
Osteoarthritis Osteoarthritis is the most common form of arthritis. It is redness, soreness, and swelling (inflammation) affecting the cartilage. Cartilage acts as a cushion, covering the ends of bones where they meet to form a joint. CAUSES  Over time, the cartilage begins to wear away. This causes bone to rub on bone. This produces pain and stiffness in the affected joints. Factors that contribute to this problem are:  Excessive body weight.  Age.  Overuse of joints. SYMPTOMS   People with osteoarthritis usually experience joint pain, swelling, or stiffness.  Over time, the joint may lose its normal shape.  Small deposits of bone (osteophytes) may grow on the edges of the joint.  Bits of bone or cartilage can break off and float inside the joint space. This may cause more pain and damage.  Osteoarthritis can lead to depression, anxiety, feelings of helplessness, and limitations on daily activities. The most commonly affected joints are in the:  Ends of the fingers.  Thumbs.  Neck.  Lower back.  Knees.  Hips. DIAGNOSIS  Diagnosis is mostly based on your symptoms and exam. Tests may be helpful, including:  X-rays of the affected joint.  A computerized magnetic scan (MRI).  Blood tests to rule out other types of arthritis.  Joint fluid tests. This involves using a needle to draw fluid from the joint and examining the fluid under a microscope. TREATMENT  Goals of treatment are to control pain, improve joint function, maintain a normal body weight, and maintain a healthy lifestyle. Treatment approaches may include:  A prescribed exercise program with rest and joint relief.  Weight control with nutritional education.  Pain relief techniques such as:  Properly applied heat and cold.  Electric pulses delivered to nerve endings under the skin (transcutaneous electrical nerve stimulation, TENS).  Massage.  Certain supplements. Ask your caregiver before using any  supplements, especially in combination with prescribed drugs.  Medicines to control pain, such as:  Acetaminophen.  Nonsteroidal anti-inflammatory drugs (NSAIDs), such as naproxen.  Narcotic or central-acting agents, such as tramadol. This drug carries a risk of addiction and is generally prescribed for short-term use.  Corticosteroids. These can be given orally or as injection. This is a short-term treatment, not recommended for routine use.  Surgery to reposition the bones and relieve pain (osteotomy) or to remove loose pieces of bone and cartilage. Joint replacement may be needed in advanced states of osteoarthritis. HOME CARE INSTRUCTIONS  Your caregiver can recommend specific types of exercise. These may include:  Strengthening exercises. These are done to strengthen the muscles that support joints affected by arthritis. They can be performed with weights or with exercise bands to add resistance.  Aerobic activities. These are exercises, such as brisk walking or low-impact aerobics, that get your heart pumping. They can help keep your lungs and circulatory system in shape.  Range-of-motion activities. These keep your joints limber.  Balance and agility exercises. These help you maintain daily living skills. Learning about your condition and being actively involved in your care will help improve the course of your osteoarthritis. SEEK MEDICAL CARE IF:   You feel hot or your skin turns red.  You develop a rash in addition to your joint pain.  You have an oral temperature above 102 F (38.9 C). FOR MORE INFORMATION  National Institute of Arthritis and Musculoskeletal and Skin Diseases: www.niams.nih.gov National Institute on Aging: www.nia.nih.gov American College of Rheumatology: www.rheumatology.org Document Released: 12/14/2005 Document Revised: 03/07/2012 Document Reviewed: 03/27/2010 [employer] Patient Information 2014 [employer].  

## 2013-12-10 NOTE — Assessment & Plan Note (Signed)
Lab Results  Component Value Date   TSH 0.54 09/19/2012  has f/u with nephrology

## 2013-12-10 NOTE — Progress Notes (Signed)
htn- tolerating meds  Hypothyroid- needs f/u  Renal insufficiency- sees nephrology (s/p transplant)  GERD- tolerating meds  Lipids- needs regular f/u  Past Medical History  Diagnosis Date  . GERD (gastroesophageal reflux disease)   . Hyperlipidemia   . Renal insufficiency   . Gout   . Bipolar disorder   . Thyroid disease     hypothyroidism  . BPH (benign prostatic hyperplasia)   . Arthritis   . Hypothyroidism   . Hypertension     dr swords    pcp  . Anemia   . Cancer     basal cell  . Cataract     History   Social History  . Marital Status: Married    Spouse Name: N/A    Number of Children: N/A  . Years of Education: N/A   Occupational History  . Not on file.   Social History Main Topics  . Smoking status: Former Smoker -- 16 years    Types: Pipe, Cigars    Quit date: 05/11/1976  . Smokeless tobacco: Never Used  . Alcohol Use: 0.6 oz/week    1 Glasses of wine per week     Comment: socially  . Drug Use: No  . Sexual Activity: Not on file   Other Topics Concern  . Not on file   Social History Narrative  . No narrative on file    Past Surgical History  Procedure Laterality Date  . Dilation for gerd    . Av fistula placement  04/08/2012    Procedure: ARTERIOVENOUS (AV) FISTULA CREATION;  Surgeon: Angelia Mould, MD;  Location: [facility] OR;  Service: Vascular;  Laterality: Left;  Creation of left brachial cephalic arteriovenous fistula  . Kidney transplant  11/06/12    [facility]    Family History  Problem Relation Age of Onset  . Cancer Father     BLADDER  . Heart disease Father     Allergies  Allergen Reactions  . Amantadine Hcl     REACTION: itching  . Chlorpromazine Hcl     REACTION: "fell flat on face"    Current Outpatient Prescriptions on File Prior to Visit  Medication Sig Dispense Refill  . allopurinol (ZYLOPRIM) 100 MG tablet Take 3 tablets (300 mg total) by mouth daily.  90 tablet  4  . amLODipine (NORVASC) 5 MG tablet Take 1  tablet by mouth daily. Take 5 mg every other day.      . carvedilol (COREG) 6.25 MG tablet Take 6.25 mg by mouth 2 (two) times daily with a meal.      . colchicine 0.6 MG tablet Take 1 tablet (0.6 mg total) by mouth 4 (four) times daily as needed.  20 tablet  3  . furosemide (LASIX) 40 MG tablet Take 40 mg by mouth.      . hydrOXYzine (ATARAX/VISTARIL) 25 MG tablet Take 20 mg by mouth 3 (three) times daily.       Marland Kitchen levothyroxine (SYNTHROID, LEVOTHROID) 25 MCG tablet Take 25 mcg by mouth daily.        . mycophenolate (MYFORTIC) 180 MG EC tablet Take 720 mg by mouth daily.       . Omega-3 1000 MG CAPS Take 1 capsule by mouth daily. Take 2 g by mouth 2 (two) times daily as needed.      Marland Kitchen omeprazole (PRILOSEC) 20 MG capsule Take 20 mg by mouth daily.        . risperiDONE (RISPERDAL) 2 MG tablet Take 1/2 tablet  daily.      . simvastatin (ZOCOR) 40 MG tablet Take 20 mg by mouth at bedtime.        . tacrolimus (PROGRAF) 1 MG capsule Take 1.5 mg by mouth 2 (two) times daily.        No current facility-administered medications on file prior to visit.     patient denies chest pain, shortness of breath, orthopnea. Denies lower extremity edema, abdominal pain, change in appetite, change in bowel movements. Patient denies rashes, musculoskeletal complaints. No other specific complaints in a complete review of systems.   Reviewed vitals  well-developed well-nourished male in no acute distress. HEENT exam atraumatic, normocephalic, neck supple without jugular venous distention. Chest clear to auscultation cardiac exam S1-S2 are regular. Abdominal exam overweight with bowel sounds, soft and nontender. Extremities no edema. Neurologic exam is alert with a normal gait.  HYPERTENSION Check labs- (done at nephrology- scheduled for today at [facility]).   HYPOTHYROIDISM Lab Results  Component Value Date   TSH 0.54 09/19/2012  has f/u with nephrology   HYPERLIPIDEMIA Has regular f/u with nephrology. Labs done  there  S/p cadaver renal transplant Has regular f/u with nephrology  GOUT No recurrence.

## 2013-12-10 NOTE — Assessment & Plan Note (Signed)
Has regular f/u with nephrology. Labs done there

## 2013-12-10 NOTE — Assessment & Plan Note (Signed)
Has regular f/u with nephrology

## 2013-12-10 NOTE — Assessment & Plan Note (Addendum)
Check labs- (done at nephrology- scheduled for today at [facility]).

## 2013-12-11 ENCOUNTER — Encounter: Payer: Self-pay | Admitting: Internal Medicine

## 2013-12-11 ENCOUNTER — Ambulatory Visit (INDEPENDENT_AMBULATORY_CARE_PROVIDER_SITE_OTHER): Payer: Medicare Other | Admitting: Internal Medicine

## 2013-12-11 VITALS — BP 132/64 | HR 80 | Temp 98.2°F | Ht 70.0 in | Wt 203.0 lb

## 2013-12-11 DIAGNOSIS — Z940 Kidney transplant status: Secondary | ICD-10-CM

## 2013-12-11 DIAGNOSIS — E039 Hypothyroidism, unspecified: Secondary | ICD-10-CM

## 2013-12-11 DIAGNOSIS — M109 Gout, unspecified: Secondary | ICD-10-CM

## 2013-12-11 DIAGNOSIS — I10 Essential (primary) hypertension: Secondary | ICD-10-CM

## 2013-12-11 DIAGNOSIS — E785 Hyperlipidemia, unspecified: Secondary | ICD-10-CM

## 2013-12-11 NOTE — Assessment & Plan Note (Signed)
No recurrence. 

## 2013-12-28 DIAGNOSIS — K824 Cholesterolosis of gallbladder: Secondary | ICD-10-CM

## 2013-12-28 HISTORY — DX: Cholesterolosis of gallbladder: K82.4

## 2014-01-08 ENCOUNTER — Ambulatory Visit (INDEPENDENT_AMBULATORY_CARE_PROVIDER_SITE_OTHER): Payer: Medicare Other | Admitting: Family Medicine

## 2014-01-08 ENCOUNTER — Encounter: Payer: Self-pay | Admitting: Family Medicine

## 2014-01-08 VITALS — BP 136/70 | HR 100 | Temp 99.6°F | Wt 200.0 lb

## 2014-01-08 DIAGNOSIS — J019 Acute sinusitis, unspecified: Secondary | ICD-10-CM

## 2014-01-08 MED ORDER — HYDROCODONE-HOMATROPINE 5-1.5 MG/5ML PO SYRP
5.0000 mL | ORAL_SOLUTION | ORAL | Status: DC | PRN
Start: 2014-01-08 — End: 2014-04-04

## 2014-01-08 MED ORDER — AZITHROMYCIN 250 MG PO TABS: ORAL_TABLET | ORAL | Status: DC

## 2014-01-08 NOTE — Progress Notes (Signed)
   Subjective:    Patient ID: Curtis Jimenez, male    DOB: [birth year], 78 y.o.   MRN: [id number]  HPI Here for one week of fevers, sinus pressure, PND, and a dry cough.    Review of Systems  Constitutional: Positive for fever.  HENT: Positive for congestion, postnasal drip and sinus pressure.   Eyes: Negative.   Respiratory: Positive for cough.        Objective:   Physical Exam  Constitutional: He appears well-developed and well-nourished.  HENT:  Right Ear: External ear normal.  Left Ear: External ear normal.  Nose: Nose normal.  Mouth/Throat: Oropharynx is clear and moist.  Eyes: Conjunctivae are normal.  Pulmonary/Chest: Effort normal and breath sounds normal.  Lymphadenopathy:    He has no cervical adenopathy.          Assessment & Plan:  Add Mucinex

## 2014-01-08 NOTE — Progress Notes (Signed)
Pre visit review using our clinic review tool, if applicable. No additional management support is needed unless otherwise documented below in the visit note. 

## 2014-04-04 ENCOUNTER — Encounter: Payer: Self-pay | Admitting: Family Medicine

## 2014-04-04 ENCOUNTER — Ambulatory Visit (INDEPENDENT_AMBULATORY_CARE_PROVIDER_SITE_OTHER): Payer: Medicare Other | Admitting: Family Medicine

## 2014-04-04 VITALS — BP 100/60 | Temp 98.5°F | Wt 202.0 lb

## 2014-04-04 DIAGNOSIS — Z940 Kidney transplant status: Secondary | ICD-10-CM

## 2014-04-04 DIAGNOSIS — N390 Urinary tract infection, site not specified: Secondary | ICD-10-CM

## 2014-04-04 LAB — POCT URINALYSIS DIPSTICK
BILIRUBIN UA: NEGATIVE
Glucose, UA: NEGATIVE
Ketones, UA: NEGATIVE
NITRITE UA: POSITIVE
PH UA: 6.5
Protein, UA: 30
Spec Grav, UA: 1.01
Urobilinogen, UA: 0.2

## 2014-04-04 MED ORDER — CIPROFLOXACIN HCL 500 MG PO TABS: 500.0000 mg | ORAL_TABLET | Freq: Two times a day (BID) | ORAL | Status: DC

## 2014-04-04 NOTE — Patient Instructions (Signed)
Cipro 500 mg........ one twice daily  Urine culture pending......Marland Kitchen report will be sent to Dr. Mercy Moore  See Dr. Mercy Moore if any problems or questions

## 2014-04-04 NOTE — Progress Notes (Signed)
   Subjective:    Patient ID: Curtis Jimenez, male    DOB: [birth year], 78 y.o.   MRN: [id number]  HPI Curtis Jimenez is a 78 year old male patient of Dr. Cain Sieve who comes in today for evaluation of urinary tract infection.  He is a kidney transplant patient and is followed by nephrology Dr. Mercy Moore. He states for the past 10 days he said frequency and dysuria. No fever chills or back pain.     Review of Systems    review of systems negative except she's a kidney transplant patient on multiple medications Objective:   Physical Exam Well-developed well-nourished male no acute distress vital signs stable is afebrile no back or genital pain  Urinalysis shows moderate white cells positive nitrate positive protein and small amount of blood..................... culture pending  A call Dr. Mercy Moore and discuss treatment. He recommended Cipro 500 twice a day urine culture and reports sent to him       Assessment & Plan:  Urinary tract infection...Marland KitchenMarland KitchenMarland Kitchen Cipro 500 twice a day x1 week followup with Dr. Mercy Moore

## 2014-04-07 LAB — URINE CULTURE: Colony Count: >=100000

## 2014-06-11 ENCOUNTER — Ambulatory Visit (INDEPENDENT_AMBULATORY_CARE_PROVIDER_SITE_OTHER): Payer: Medicare Other | Admitting: Internal Medicine

## 2014-06-11 ENCOUNTER — Encounter: Payer: Self-pay | Admitting: Internal Medicine

## 2014-06-11 VITALS — BP 126/74 | HR 80 | Temp 98.5°F | Ht 70.0 in | Wt 205.0 lb

## 2014-06-11 DIAGNOSIS — E785 Hyperlipidemia, unspecified: Secondary | ICD-10-CM

## 2014-06-11 DIAGNOSIS — E039 Hypothyroidism, unspecified: Secondary | ICD-10-CM

## 2014-06-11 DIAGNOSIS — M109 Gout, unspecified: Secondary | ICD-10-CM

## 2014-06-11 DIAGNOSIS — Z940 Kidney transplant status: Secondary | ICD-10-CM

## 2014-06-11 LAB — LIPID PANEL
CHOL/HDL RATIO: 3
Cholesterol: 133 mg/dL (ref 0–200)
HDL: 44.3 mg/dL (ref >39.00)
LDL Cholesterol: 75 mg/dL (ref 0–99)
NONHDL: 88.7
Triglycerides: 70 mg/dL (ref 0.0–149.0)
VLDL: 14 mg/dL (ref 0.0–40.0)

## 2014-06-11 LAB — HEPATIC FUNCTION PANEL
ALK PHOS: 50 U/L (ref 39–117)
ALT: 14 U/L (ref 0–53)
AST: 16 U/L (ref 0–37)
Albumin: 3.5 g/dL (ref 3.5–5.2)
Bilirubin, Direct: 0.1 mg/dL (ref 0.0–0.3)
Total Bilirubin: 0.5 mg/dL (ref 0.2–1.2)
Total Protein: 6.6 g/dL (ref 6.0–8.3)

## 2014-06-11 MED ORDER — LEVOTHYROXINE SODIUM 25 MCG PO TABS: 25.0000 ug | ORAL_TABLET | Freq: Every day | ORAL | Status: DC

## 2014-06-11 NOTE — Progress Notes (Signed)
Patient ID: Curtis Jimenez, male   DOB: 1936/05/26, 78 y.o.   MRN: [id number]   gerd- no sxs  Lipids- needs f/u  htn- follwed by nephrology  ESRD- s/p transplant- doing well .  Immunizations- tdap at dr Mercy Moore  Past Medical History  Diagnosis Date  . GERD (gastroesophageal reflux disease)   . Hyperlipidemia   . Renal insufficiency   . Gout   . Bipolar disorder   . Thyroid disease     hypothyroidism  . BPH (benign prostatic hyperplasia)   . Arthritis   . Hypothyroidism   . Hypertension     dr swords    pcp  . Anemia   . Cancer     basal cell  . Cataract     History   Social History  . Marital Status: Married    Spouse Name: N/A    Number of Children: N/A  . Years of Education: N/A   Occupational History  . Not on file.   Social History Main Topics  . Smoking status: Former Smoker -- 16 years    Types: Pipe, Cigars    Quit date: 05/11/1976  . Smokeless tobacco: Never Used  . Alcohol Use: No  . Drug Use: No  . Sexual Activity: Not on file   Other Topics Concern  . Not on file   Social History Narrative  . No narrative on file    Past Surgical History  Procedure Laterality Date  . Dilation for gerd    . Av fistula placement  04/08/2012    Procedure: ARTERIOVENOUS (AV) FISTULA CREATION;  Surgeon: Angelia Mould, MD;  Location: [facility] OR;  Service: Vascular;  Laterality: Left;  Creation of left brachial cephalic arteriovenous fistula  . Kidney transplant  11/06/12    [facility]    Family History  Problem Relation Age of Onset  . Cancer Father     BLADDER  . Heart disease Father     Allergies  Allergen Reactions  . Amantadine Hcl     REACTION: itching  . Chlorpromazine Hcl     REACTION: "fell flat on face"  . Chlorpromazine Hcl Other (See Comments)    REACTION: "fell flat on face"    Current Outpatient Prescriptions on File Prior to Visit  Medication Sig Dispense Refill  . allopurinol (ZYLOPRIM) 100 MG tablet Take 3 tablets (300 mg  total) by mouth daily.  90 tablet  4  . aspirin 81 MG chewable tablet Chew 81 mg by mouth.      . carvedilol (COREG) 6.25 MG tablet Take 6.25 mg by mouth 2 (two) times daily with a meal.      . colchicine 0.6 MG tablet Take 1 tablet (0.6 mg total) by mouth 4 (four) times daily as needed.  20 tablet  3  . hydrOXYzine (ATARAX/VISTARIL) 25 MG tablet Take 20 mg by mouth 3 (three) times daily.       . mycophenolate (MYFORTIC) 180 MG EC tablet Take 720 mg by mouth daily.       . Omega-3 1000 MG CAPS Take 2 g by mouth 2 (two) times daily as needed.      Marland Kitchen omeprazole (PRILOSEC) 20 MG capsule Take 20 mg by mouth daily.        . risperiDONE (RISPERDAL) 2 MG tablet Take 1/2 tablet daily.      . simvastatin (ZOCOR) 40 MG tablet Take 20 mg by mouth at bedtime.        . tacrolimus (PROGRAF) 1  MG capsule Take 1 1/2 in the AM and 1 in the PM      . thiamine (VITAMIN B-1) 100 MG tablet Take 100 mg by mouth.       No current facility-administered medications on file prior to visit.     patient denies chest pain, shortness of breath, orthopnea. Denies lower extremity edema, abdominal pain, change in appetite, change in bowel movements. Patient denies rashes, musculoskeletal complaints. No other specific complaints in a complete review of systems.   BP 126/74  Pulse 80  Temp(Src) 98.5 F (36.9 C) (Oral)  Ht 5\' 10"  (1.778 m)  Wt 205 lb (92.987 kg)  BMI 29.41 kg/m2  well-developed well-nourished male in no acute distress. HEENT exam atraumatic, normocephalic, neck supple without jugular venous distention. Chest clear to auscultation cardiac exam S1-S2 are regular. Abdominal exam overweight with bowel sounds, soft and nontender. Extremities no edema. Neurologic exam is alert with a normal gait.   HYPERLIPIDEMIA Lipid Panel     Component Value Date/Time   CHOL 133 06/11/2014 0943   TRIG 70.0 06/11/2014 0943   HDL 44.30 06/11/2014 0943   CHOLHDL 3 06/11/2014 0943   VLDL 14.0 06/11/2014 0943   LDLCALC 75  06/11/2014 0943   Controlled Continue same meds   S/p cadaver renal transplant Followed by nephrology  GOUT No recurrence  HYPOTHYROIDISM Lab Results  Component Value Date   TSH 0.54 09/19/2012   Pt is on levothyroxine for bipolar disorder

## 2014-06-11 NOTE — Progress Notes (Signed)
Pre visit review using our clinic review tool, if applicable. No additional management support is needed unless otherwise documented below in the visit note. 

## 2014-06-14 ENCOUNTER — Encounter: Payer: Self-pay | Admitting: Internal Medicine

## 2014-06-16 NOTE — Assessment & Plan Note (Signed)
Lipid Panel     Component Value Date/Time   CHOL 133 06/11/2014 0943   TRIG 70.0 06/11/2014 0943   HDL 44.30 06/11/2014 0943   CHOLHDL 3 06/11/2014 0943   VLDL 14.0 06/11/2014 0943   LDLCALC 75 06/11/2014 0943   Controlled Continue same meds

## 2014-06-16 NOTE — Assessment & Plan Note (Signed)
Lab Results  Component Value Date   TSH 0.54 09/19/2012   Pt is on levothyroxine for bipolar disorder

## 2014-06-16 NOTE — Assessment & Plan Note (Signed)
No recurrence. 

## 2014-06-16 NOTE — Assessment & Plan Note (Signed)
Followed by nephrology. 

## 2014-11-21 ENCOUNTER — Ambulatory Visit (INDEPENDENT_AMBULATORY_CARE_PROVIDER_SITE_OTHER): Payer: Medicare Other | Admitting: Family Medicine

## 2014-11-21 ENCOUNTER — Encounter: Payer: Self-pay | Admitting: Family Medicine

## 2014-11-21 VITALS — BP 106/66 | HR 74 | Temp 98.1°F | Resp 18 | Ht 70.0 in | Wt 210.0 lb

## 2014-11-21 DIAGNOSIS — R062 Wheezing: Secondary | ICD-10-CM

## 2014-11-21 DIAGNOSIS — J209 Acute bronchitis, unspecified: Secondary | ICD-10-CM

## 2014-11-21 MED ORDER — HYDROCODONE-HOMATROPINE 5-1.5 MG/5ML PO SYRP: ORAL_SOLUTION | ORAL | Status: DC

## 2014-11-21 MED ORDER — PREDNISONE 20 MG PO TABS: ORAL_TABLET | ORAL | Status: DC

## 2014-11-21 NOTE — Progress Notes (Signed)
OFFICE NOTE  11/21/2014  CC:  Chief Complaint  Patient presents with  . Cough    x Sunday night  . URI    x 2 weeks   HPI: Patient is a 78 y.o. Caucasian male who is here for respiratory symptoms. Two weeks ago developed dry cough w/out URI sx's and it was resolving gradually but then it started getting worse again the last 3-4 days.  No fevers.  No SOB, chest tightness, or wheezing.  The cough is starting to rattle some.  Tussin OTC cough med no help.  ROS: no ear pain, no ST, no nasal congestion or runny nose, no HA, no fevers.  Of note, he has been having some postprandial abd upset, was found to have gallbladder polyps and has been set up to see a gen surgeon regarding this situation.  Pertinent PMH:  Past medical, surgical, hx reviewed, chronic meds reviewed.  MEDS:  Outpatient Prescriptions Prior to Visit  Medication Sig Dispense Refill  . allopurinol (ZYLOPRIM) 100 MG tablet Take 3 tablets (300 mg total) by mouth daily. 90 tablet 4  . aspirin 81 MG chewable tablet Chew 81 mg by mouth.    . carvedilol (COREG) 6.25 MG tablet Take 6.25 mg by mouth 2 (two) times daily with a meal.    . Cholecalciferol (VITAMIN D) 2000 UNITS CAPS Take 1 capsule by mouth daily.    . colchicine 0.6 MG tablet Take 1 tablet (0.6 mg total) by mouth 4 (four) times daily as needed. 20 tablet 3  . hydrOXYzine (ATARAX/VISTARIL) 25 MG tablet Take 20 mg by mouth 3 (three) times daily.     Marland Kitchen levothyroxine (SYNTHROID, LEVOTHROID) 25 MCG tablet Take 1 tablet (25 mcg total) by mouth daily. 30 tablet 0  . mycophenolate (MYFORTIC) 180 MG EC tablet Take 720 mg by mouth daily.     . Omega-3 1000 MG CAPS Take 2 g by mouth 2 (two) times daily as needed.    Marland Kitchen omeprazole (PRILOSEC) 20 MG capsule Take 20 mg by mouth daily.      . risperiDONE (RISPERDAL) 2 MG tablet Take 1/2 tablet daily.    . simvastatin (ZOCOR) 40 MG tablet Take 20 mg by mouth at bedtime.      . tacrolimus (PROGRAF) 1 MG capsule Take 1 1/2 in the AM  and 1 in the PM    . thiamine (VITAMIN B-1) 100 MG tablet Take 100 mg by mouth.     No facility-administered medications prior to visit.    PE: Blood pressure 106/66, pulse 74, temperature 98.1 F (36.7 C), temperature source Temporal, resp. rate 18, height 5\' 10"  (1.778 m), weight 210 lb (95.255 kg), SpO2 94 %.  Gen: Alert, well appearing.  Patient is oriented to person, place, time, and situation. AFFECT: pleasant, lucid thought and speech. ENT: Ears: EACs clear, normal epithelium.  TMs with good light reflex and landmarks bilaterally.  Eyes: no injection, icteris, swelling, or exudate.  EOMI, PERRLA. Nose: no drainage or turbinate edema/swelling.  No injection or focal lesion.  Mouth: lips without lesion/swelling.  Oral mucosa pink and moist.  Dentition intact and without obvious caries or gingival swelling.  Oropharynx without erythema, exudate, or swelling.  Neck - No masses or thyromegaly or limitation in range of motion CV: RRR, no m/r/g LUNGS: clear on insp, occ coarse exp rhonchi, coughs easily with strong exhalations, trace end exp wheezing but no prolongation of exp phase and he has good aeration.  IMPRESSION AND PLAN:  Acute  bronchitis, mild RAD. No sign of bacterial infection. He is regularly on bactrim SS three days a week and he'll continue this but I'll add no additional antibiotics today. Prednisone 40mg  qd x 5d. Hycodan syrup 1 tsp qhs prn.  Therapeutic expectations and side effect profile of medication discussed today.  Patient's questions answered.  An After Visit Summary was printed and given to the patient.  FOLLOW UP: 2 wks, formal establish care visit

## 2014-11-21 NOTE — Progress Notes (Signed)
Pre visit review using our clinic review tool, if applicable. No additional management support is needed unless otherwise documented below in the visit note. 

## 2014-12-03 ENCOUNTER — Ambulatory Visit: Payer: Medicare Other | Admitting: Internal Medicine

## 2014-12-05 ENCOUNTER — Ambulatory Visit (INDEPENDENT_AMBULATORY_CARE_PROVIDER_SITE_OTHER): Payer: Medicare Other | Admitting: Family Medicine

## 2014-12-05 ENCOUNTER — Encounter: Payer: Self-pay | Admitting: Family Medicine

## 2014-12-05 VITALS — BP 112/71 | HR 72 | Temp 97.9°F | Resp 16 | Ht 70.0 in | Wt 208.0 lb

## 2014-12-05 DIAGNOSIS — J209 Acute bronchitis, unspecified: Secondary | ICD-10-CM

## 2014-12-05 NOTE — Progress Notes (Signed)
Pre visit review using our clinic review tool, if applicable. No additional management support is needed unless otherwise documented below in the visit note. 

## 2014-12-05 NOTE — Progress Notes (Signed)
OFFICE NOTE  12/05/2014  CC:  Chief Complaint  Patient presents with  . Follow-up    bronchitis   HPI: Patient is a 78 y.o. Caucasian male who is here for 2 week f/u acute bronchitis. I saw him 2 wks ago and rx'd 5 days of 40mg  qd prednisone + hycodan syrup. Feels 90% better.  Not taking cough med in daytime anymore.  Only sometimes at night taking some cough med. No fevers.  Appetite excellent.  No wheezing/SOB/chest tightness.  Cough is usually nonproductive.  Pertinent PMH:  Past medical, surgical, social, and family history reviewed and no changes are noted since last office visit.  MEDS:  Outpatient Prescriptions Prior to Visit  Medication Sig Dispense Refill  . allopurinol (ZYLOPRIM) 100 MG tablet Take 3 tablets (300 mg total) by mouth daily. 90 tablet 4  . aspirin 81 MG chewable tablet Chew 81 mg by mouth.    . carvedilol (COREG) 6.25 MG tablet Take 6.25 mg by mouth 2 (two) times daily with a meal.    . Cholecalciferol (VITAMIN D) 2000 UNITS CAPS Take 1 capsule by mouth daily.    . colchicine 0.6 MG tablet Take 1 tablet (0.6 mg total) by mouth 4 (four) times daily as needed. 20 tablet 3  . HYDROcodone-homatropine (HYCODAN) 5-1.5 MG/5ML syrup 1 tsp po qhs prn cough 120 mL 0  . hydrOXYzine (ATARAX/VISTARIL) 25 MG tablet Take 20 mg by mouth 3 (three) times daily.     Marland Kitchen levothyroxine (SYNTHROID, LEVOTHROID) 25 MCG tablet Take 1 tablet (25 mcg total) by mouth daily. 30 tablet 0  . mycophenolate (MYFORTIC) 180 MG EC tablet Take 720 mg by mouth daily.     . Omega-3 1000 MG CAPS Take 2 g by mouth 2 (two) times daily as needed.    Marland Kitchen omeprazole (PRILOSEC) 20 MG capsule Take 20 mg by mouth daily.      . risperiDONE (RISPERDAL) 2 MG tablet Take 1/2 tablet daily.    . simvastatin (ZOCOR) 40 MG tablet Take 20 mg by mouth at bedtime.      . tacrolimus (PROGRAF) 1 MG capsule Take 1 1/2 in the AM and 1 in the PM    . thiamine (VITAMIN B-1) 100 MG tablet Take 100 mg by mouth.    . predniSONE  (DELTASONE) 20 MG tablet 2 tabs po qd x 5d (Patient not taking: Reported on 12/05/2014) 10 tablet 0   No facility-administered medications prior to visit.    PE: Blood pressure 112/71, pulse 72, temperature 97.9 F (36.6 C), temperature source Temporal, resp. rate 16, height 5\' 10"  (1.778 m), weight 208 lb (94.348 kg), SpO2 95 %. VS: noted--normal. Gen: alert, NAD, NONTOXIC APPEARING. HEENT: eyes without injection, drainage, or swelling.  Ears: EACs clear, TMs with normal light reflex and landmarks.  Nose: No rhinorrhea, scant dried, crusty exudate adherent to mildly injected mucosa.  No purulent d/c.  No paranasal sinus TTP.  No facial swelling.  Throat and mouth without focal lesion.  No pharyngial swelling, erythema, or exudate.   Neck: supple, no LAD.   LUNGS: CTA bilat, nonlabored resps.   CV: RRR, no m/r/g. EXT: no c/c/e SKIN: no rash   IMPRESSION AND PLAN:  Acute viral bronchitis. Resolving appropriately in this partially immunocompromised pt (from his meds). Reassured.  Continue to treat symptomatically prn. Signs/symptoms to call or return for were reviewed and pt expressed understanding.  An After Visit Summary was printed and given to the patient.  FOLLOW UP: 1-2 for  official "establish care" visit

## 2014-12-10 ENCOUNTER — Other Ambulatory Visit (INDEPENDENT_AMBULATORY_CARE_PROVIDER_SITE_OTHER): Payer: Self-pay

## 2014-12-10 DIAGNOSIS — K824 Cholesterolosis of gallbladder: Secondary | ICD-10-CM

## 2015-01-16 ENCOUNTER — Ambulatory Visit (INDEPENDENT_AMBULATORY_CARE_PROVIDER_SITE_OTHER): Payer: Medicare HMO | Admitting: Family Medicine

## 2015-01-16 ENCOUNTER — Encounter: Payer: Self-pay | Admitting: Family Medicine

## 2015-01-16 VITALS — BP 114/71 | HR 74 | Temp 97.4°F | Resp 18 | Ht 70.0 in | Wt 206.0 lb

## 2015-01-16 DIAGNOSIS — F3170 Bipolar disorder, currently in remission, most recent episode unspecified: Secondary | ICD-10-CM

## 2015-01-16 DIAGNOSIS — I10 Essential (primary) hypertension: Secondary | ICD-10-CM | POA: Insufficient documentation

## 2015-01-16 DIAGNOSIS — N183 Chronic kidney disease, stage 3 unspecified: Secondary | ICD-10-CM

## 2015-01-16 DIAGNOSIS — M1000 Idiopathic gout, unspecified site: Secondary | ICD-10-CM

## 2015-01-16 DIAGNOSIS — E039 Hypothyroidism, unspecified: Secondary | ICD-10-CM

## 2015-01-16 DIAGNOSIS — Z940 Kidney transplant status: Secondary | ICD-10-CM

## 2015-01-16 DIAGNOSIS — E785 Hyperlipidemia, unspecified: Secondary | ICD-10-CM

## 2015-01-16 LAB — TSH: TSH: 0.68 u[IU]/mL (ref 0.35–4.50)

## 2015-01-16 LAB — BASIC METABOLIC PANEL
BUN: 25 mg/dL — AB (ref 6–23)
CO2: 28 meq/L (ref 19–32)
Calcium: 9.4 mg/dL (ref 8.4–10.5)
Chloride: 107 mEq/L (ref 96–112)
Creatinine, Ser: 1.7 mg/dL — ABNORMAL HIGH (ref 0.40–1.50)
GFR: 41.85 mL/min — AB (ref >60.00)
GLUCOSE: 107 mg/dL — AB (ref 70–99)
Potassium: 4.7 mEq/L (ref 3.5–5.1)
Sodium: 140 mEq/L (ref 135–145)

## 2015-01-16 MED ORDER — SIMVASTATIN 40 MG PO TABS: ORAL_TABLET | ORAL | Status: DC

## 2015-01-16 MED ORDER — AMOXICILLIN 500 MG PO CAPS: ORAL_CAPSULE | ORAL | Status: DC

## 2015-01-16 MED ORDER — AMLODIPINE BESYLATE 5 MG PO TABS: 5.0000 mg | ORAL_TABLET | Freq: Every day | ORAL | Status: DC

## 2015-01-16 MED ORDER — ALLOPURINOL 100 MG PO TABS: 300.0000 mg | ORAL_TABLET | Freq: Every day | ORAL | Status: DC

## 2015-01-16 NOTE — Progress Notes (Signed)
Pre visit review using our clinic review tool, if applicable. No additional management support is needed unless otherwise documented below in the visit note. 

## 2015-01-16 NOTE — Progress Notes (Signed)
Office Note 01/16/2015  CC:  Chief Complaint  Patient presents with  . Annual Exam    fasting   HPI:  Curtis Jimenez is a 79 y.o. White male who is here for chronic illness f/u and official transfer of care to me from Dr. Leanne Chang, who is no longer doing clinical care.  To make things somewhat confusing for PCPs, this patient also sees and MD at the [place] and he has no idea what their names are or what tests get done--he simply goes b/c he can get some meds cheap/free through them.    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 Mercy Moore at [facility] locally q63mo, most recently 10/2014 per pt---I don't have this note or the labs from this visit.  Pt reports avg Cr lately 1.6-1.9 range and pt asks that this be checked again today.  Sees psychiatrist, Dr. Clovis Pu + "one at the [facility] so I can get my meds cheap", and says he has been stable for 20+ years on risperdal and wants to stop going to Dr. Clovis Pu.  He reports compliance with meds, says he is "in good health" but is "30 lbs overweight" and "I can't lose it".   No gout flares in at least 2 yrs. Says left arm anticubital region AV fistula has had a gradually enlarging dilatation over the last couple of years.  Reports having "3 gallbladder attacks in December but I changed my diet and they have not come back since".   Past Medical History  Diagnosis Date  . GERD (gastroesophageal reflux disease)   . Hyperlipidemia   . Renal insufficiency   . Gout   . Bipolar disorder   . Hypothyroidism   . BPH (benign prostatic hyperplasia)   . Arthritis   . Hypothyroidism   . Hypertension   . Anemia   . Basal cell carcinoma     neck (skin MD 2X per year)  . Cataract   . 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 Dilation for gerd    . Av fistula placement  04/08/2012    Procedure: ARTERIOVENOUS (AV) FISTULA CREATION;  Surgeon: Angelia Mould, MD;  [facility]: [facility];  Service: Vascular;  Laterality: Left;  Creation of left brachial cephalic arteriovenous fistula  . Kidney transplant  11/06/12    [facility]  . Prostate biopsy  2011    no malignancy    Family History  Problem Relation Age of Onset  . Cancer Father     BLADDER  . Heart disease Father     History   Social History  . Marital Status: Married    Spouse Name: N/A    Number of Children: N/A  . Years of Education: N/A   Occupational History  . Not on file.   Social History Main Topics  . Smoking status: Former Smoker -- 16 years    Types: Pipe, Cigars    Quit date: 05/11/1976  . Smokeless tobacco: Never Used  . Alcohol Use: No  . Drug Use: No  . Sexual Activity: Not on file   Other Topics Concern  . Not on file   Social History Narrative   Married, 4 children, 9 GGC, [place].   Occupation: retired Marine scientist.  Originally from [place].   Tobacco: quit 1975; smoked pipes and cigars x 15 yrs  prior to this.   Alcohol: none.   Exercise: minimal, but is going to sign up for silver sneakers at the [facility].   MEDS: also takes amlodipine 5mg  qd via Dr. Mercy Moore Outpatient Prescriptions Prior to Visit  Medication Sig Dispense Refill  . aspirin 81 MG chewable tablet Chew 81 mg by mouth.    . carvedilol (COREG) 6.25 MG tablet Take 6.25 mg by mouth 2 (two) times daily with a meal.    . Cholecalciferol (VITAMIN D) 2000 UNITS CAPS Take 1 capsule by mouth daily.    . colchicine 0.6 MG tablet Take 1 tablet (0.6 mg total) by mouth 4 (four) times daily as needed. 20 tablet 3  . hydrOXYzine (ATARAX/VISTARIL) 25 MG tablet Take 20 mg by mouth 3 (three) times daily.     Marland Kitchen levothyroxine (SYNTHROID, LEVOTHROID) 25 MCG tablet Take 1 tablet (25 mcg total) by mouth daily. 30 tablet 0  . mycophenolate (MYFORTIC) 180 MG EC tablet Take 720 mg by mouth daily.     . Omega-3 1000 MG CAPS Take 2 g by mouth  2 (two) times daily as needed.    Marland Kitchen omeprazole (PRILOSEC) 20 MG capsule Take 20 mg by mouth daily.      . risperiDONE (RISPERDAL) 2 MG tablet Take 1/2 tablet daily.    . tacrolimus (PROGRAF) 1 MG capsule Take 1 1/2 in the AM and 1 in the PM    . thiamine (VITAMIN B-1) 100 MG tablet Take 100 mg by mouth.    Marland Kitchen allopurinol (ZYLOPRIM) 100 MG tablet Take 3 tablets (300 mg total) by mouth daily. 90 tablet 4  . simvastatin (ZOCOR) 40 MG tablet Take 20 mg by mouth at bedtime.      Marland Kitchen HYDROcodone-homatropine (HYCODAN) 5-1.5 MG/5ML syrup 1 tsp po qhs prn cough 120 mL 0   No facility-administered medications prior to visit.    Allergies  Allergen Reactions  . Amantadine Hcl     REACTION: itching  . Chlorpromazine Hcl     REACTION: "fell flat on face"  . Chlorpromazine Hcl Other (See Comments)    REACTION: "fell flat on face"   ROS Review of Systems  Constitutional: Negative for fever and fatigue.  HENT: Negative for congestion and sore throat.   Eyes: Negative for visual disturbance.  Respiratory: Negative for cough.   Cardiovascular: Negative for chest pain.  Gastrointestinal: Negative for nausea and abdominal pain.  Genitourinary: Negative for dysuria.  Musculoskeletal: Negative for back pain and joint swelling.  Skin: Negative for rash.  Neurological: Negative for weakness and headaches.  Hematological: Negative for adenopathy.    PE; Blood pressure 114/71, pulse 74, temperature 97.4 F (36.3 C), temperature source Temporal, resp. rate 18, height 5\' 10"  (1.778 m), weight 206 lb (93.441 kg), SpO2 95 %. Gen: Alert, well appearing.  Patient is oriented to person, place, time, and situation. ENT: Ears: EACs clear, normal epithelium.  TMs with good light reflex and landmarks bilaterally.  Eyes: no injection, icteris, swelling, or exudate.  EOMI, PERRLA. Nose: no drainage or turbinate edema/swelling.  No injection or focal lesion.  Mouth: lips without lesion/swelling.  Oral mucosa pink and  moist.  Dentition intact and without obvious caries or gingival swelling.  Oropharynx without erythema, exudate, or swelling.  Neck - No masses or thyromegaly or limitation in range of motion CV: RRR, no m/r/g.   LUNGS: CTA bilat, nonlabored resps, good aeration in all lung fields. ABD: soft, NT, ND, BS normal.  No hepatospenomegaly or mass.  No bruits. EXT: no clubbing, cyanosis, or edema.   Left antecubital fossa with AV fistula with good thrill and mild aneurismal dilatation without erythema or tenderness.  Pertinent labs:  None today   Lab Results  Component Value Date   CHOL 133 06/11/2014   HDL 44.30 06/11/2014   LDLCALC 75 06/11/2014   TRIG 70.0 06/11/2014   CHOLHDL 3 06/11/2014   Lab Results  Component Value Date   TSH 0.54 09/19/2012     ASSESSMENT AND PLAN:   1) CRI, stage 3: will ask Dr. Etheleen Nicks office for his latest note and labs.  Pt requests BMET today and I think this is reasonable even though I am sure Dr. Mercy Moore did this at his most recent f/u.  2) Hypothyroidsim: overdue for TSH according to lab records in EPIC/HL.   TSH check today.  3) HTN: The current medical regimen is effective;  continue present plan and medications.  4) Hyperlipidemia; tolerating statin.  LDL June 2015 was 75. Will plan to repeat FLP in 6 mo.  5) S/p renal transplant: continue mycophenolate and tacrolimus--dosing as per Dr. Mercy Moore and/or pt's renal transplant team (sees [facility] transplant team for f/u only annually, though).  I renewed his pre-dental procedure amoxil rx today. I asked him to bring the gradually enlarging dilatation of the AV fistula in his left antecubital fossa to the attention of Dr. Mercy Moore at next f/u.  I don't see any need for urgent attention by a specialist/vascular surgeon at this time.  6) Bipolar disorder: stable/remission, continue current risperdal dosing of 1/2 of 2mg  tab qd and continue f/u with psychiatrist, Dr. Clovis Pu. However, pt did hint  that he may stop going to Dr. Clovis Pu, at which time I would be ok with assuming care for this condition.  7) Gout; doing very well on allopurinol 300 mg qd.  He has colchicine to use prn flare.  An After Visit Summary was printed and given to the patient.  FOLLOW UP:  Return in about 6 months (around 07/17/2015) for routine chronic illness f/u (fasting).

## 2015-01-17 ENCOUNTER — Telehealth: Payer: Self-pay | Admitting: Family Medicine

## 2015-01-17 NOTE — Telephone Encounter (Signed)
emmi mailed  °

## 2015-01-19 ENCOUNTER — Encounter: Payer: Self-pay | Admitting: Family Medicine

## 2015-04-02 ENCOUNTER — Encounter: Payer: Self-pay | Admitting: Family Medicine

## 2015-07-19 ENCOUNTER — Ambulatory Visit (INDEPENDENT_AMBULATORY_CARE_PROVIDER_SITE_OTHER): Payer: Medicare HMO | Admitting: Family Medicine

## 2015-07-19 ENCOUNTER — Encounter: Payer: Self-pay | Admitting: Family Medicine

## 2015-07-19 VITALS — BP 101/63 | HR 73 | Temp 98.0°F | Resp 16 | Ht 70.0 in | Wt 205.0 lb

## 2015-07-19 DIAGNOSIS — I10 Essential (primary) hypertension: Secondary | ICD-10-CM

## 2015-07-19 DIAGNOSIS — Z23 Encounter for immunization: Secondary | ICD-10-CM | POA: Diagnosis not present

## 2015-07-19 DIAGNOSIS — E785 Hyperlipidemia, unspecified: Secondary | ICD-10-CM | POA: Diagnosis not present

## 2015-07-19 DIAGNOSIS — Z940 Kidney transplant status: Secondary | ICD-10-CM

## 2015-07-19 DIAGNOSIS — L600 Ingrowing nail: Secondary | ICD-10-CM

## 2015-07-19 DIAGNOSIS — E039 Hypothyroidism, unspecified: Secondary | ICD-10-CM

## 2015-07-19 DIAGNOSIS — Z131 Encounter for screening for diabetes mellitus: Secondary | ICD-10-CM

## 2015-07-19 DIAGNOSIS — Z0000 Encounter for general adult medical examination without abnormal findings: Secondary | ICD-10-CM

## 2015-07-19 NOTE — Progress Notes (Signed)
Pre visit review using our clinic review tool, if applicable. No additional management support is needed unless otherwise documented below in the visit note. 

## 2015-07-19 NOTE — Progress Notes (Signed)
OFFICE VISIT  07/20/2015   CC:  Chief Complaint  Patient presents with  . Follow-up    6 month f/u. Pt is not fasting.      HPI:    Patient is a 79 y.o. Caucasian male who presents for 6 mo f/u HTN, hyperlipidemia, hypothyroidism. He gets followed for his hx of renal transplant by Dr. Mercy Moore locally q4 mo and by [facility] transplant team annually.  Also sees a [place] MD at times.   Most recent Cr with Dr. Mercy Moore 1.7, prograf level good, no changes made.   We had planned on repeat FLP today but he is not fasting.  Takes 1/2 of 40mg  simvastatin daily.  He is due for AST/ALT check.  TSH stable last visit, pt compliant with this med.  Getting tingling sensation on underside of middle toe of both feet.  He wonders about diabetes screening.  Says he is doing fine, no acute complaints.  He and wife do silver sneakers at the [facility] 2-3 times per week. ROS: no CP, no SOB, no LE swelling.  Says he has not been to the [place] in over 6 months.  Past Medical History  Diagnosis Date  . GERD (gastroesophageal reflux disease)   . Hyperlipidemia   . Chronic renal insufficiency, stage III (moderate)   . Gout   . Bipolar disorder   . Hypothyroidism   . BPH (benign prostatic hyperplasia)     with elevated PSA; followed by Dr. Rosana Hoes at [facility]  . Arthritis   . Hypothyroidism   . Hypertension   . Anemia   . Basal cell carcinoma     neck (skin MD 2X per year)  . Cataract   . S/p cadaver renal transplant 2013    Secondary to HTN +lithium toxicity over 30 yrs caused kidney destruction  . Gallbladder polyp 2015    Asymptomatic  . Restless legs syndrome     Past Surgical History  Procedure Laterality Date  . Dilation for gerd    . Av fistula placement  04/08/2012    Procedure: ARTERIOVENOUS (AV) FISTULA CREATION;  Surgeon: Angelia Mould, MD;  Location: [facility] OR;  Service: Vascular;  Laterality: Left;  Creation of left brachial cephalic arteriovenous fistula  . Kidney transplant   11/06/12    [facility] (cadaveric)  . Prostate biopsy  2011    no malignancy  . Colonoscopy  2013    Normal except diverticulosis (recall 10 yrs)    Outpatient Prescriptions Prior to Visit  Medication Sig Dispense Refill  . allopurinol (ZYLOPRIM) 100 MG tablet Take 3 tablets (300 mg total) by mouth daily. 90 tablet 4  . amLODipine (NORVASC) 5 MG tablet Take 1 tablet (5 mg total) by mouth daily. 90 tablet 3  . aspirin 81 MG chewable tablet Chew 81 mg by mouth.    . carvedilol (COREG) 6.25 MG tablet Take 6.25 mg by mouth 2 (two) times daily with a meal.    . Cholecalciferol (VITAMIN D) 2000 UNITS CAPS Take 1 capsule by mouth daily.    . colchicine 0.6 MG tablet Take 1 tablet (0.6 mg total) by mouth 4 (four) times daily as needed. 20 tablet 3  . hydrOXYzine (ATARAX/VISTARIL) 25 MG tablet Take 20 mg by mouth 3 (three) times daily.     Marland Kitchen levothyroxine (SYNTHROID, LEVOTHROID) 25 MCG tablet Take 1 tablet (25 mcg total) by mouth daily. 30 tablet 0  . mycophenolate (MYFORTIC) 180 MG EC tablet Take 720 mg by mouth daily.     Marland Kitchen  Omega-3 1000 MG CAPS Take 2 g by mouth 2 (two) times daily as needed.    Marland Kitchen omeprazole (PRILOSEC) 20 MG capsule Take 20 mg by mouth daily.      . risperiDONE (RISPERDAL) 2 MG tablet Take 1/2 tablet daily.    . simvastatin (ZOCOR) 40 MG tablet 1/2 tab po qd 45 tablet 3  . tacrolimus (PROGRAF) 1 MG capsule Take 1 1/2 in the AM and 1 in the PM    . thiamine (VITAMIN B-1) 100 MG tablet Take 100 mg by mouth.    Marland Kitchen amoxicillin (AMOXIL) 500 MG capsule 4 tabs po 1 hour prior to dental procedure (Patient not taking: Reported on 07/19/2015) 4 capsule 5  . sulfamethoxazole-trimethoprim (BACTRIM,SEPTRA) 400-80 MG per tablet Take 1 tablet by mouth 3 (three) times a week.     No facility-administered medications prior to visit.    Allergies  Allergen Reactions  . Amantadine Hcl     REACTION: itching  . Chlorpromazine Hcl     REACTION: "fell flat on face"  . Chlorpromazine Hcl Other (See  Comments)    REACTION: "fell flat on face"    ROS As per HPI  PE: Blood pressure 101/63, pulse 73, temperature 98 F (36.7 C), temperature source Oral, resp. rate 16, height 5\' 10"  (1.778 m), weight 205 lb (92.987 kg), SpO2 92 %. Feet: R great toenail ingrown med and lat, with medial erythema and mild tenderness.  No TTp, swelling, or warmth of the IP or MTP joint of great toe.    LABS:  Lab Results  Component Value Date   TSH 0.68 01/16/2015   Lab Results  Component Value Date   CHOL 133 06/11/2014   HDL 44.30 06/11/2014   LDLCALC 75 06/11/2014   TRIG 70.0 06/11/2014   CHOLHDL 3 06/11/2014     Chemistry      Component Value Date/Time   NA 140 01/16/2015 1031   NA 141 12/14/2011   K 4.7 01/16/2015 1031   CL 107 01/16/2015 1031   CO2 28 01/16/2015 1031   BUN 25* 01/16/2015 1031   BUN 35* 12/14/2011   CREATININE 1.70* 01/16/2015 1031   CREATININE 3.8* 08/01/2012   GLU 120 12/14/2011      Component Value Date/Time   CALCIUM 9.4 01/16/2015 1031   CALCIUM 9.5 12/06/2010 0046   ALKPHOS 50 06/11/2014 0943   AST 16 06/11/2014 0943   ALT 14 06/11/2014 0943   BILITOT 0.5 06/11/2014 0943     IMPRESSION AND PLAN:  1) HTN; The current medical regimen is effective;  continue present plan and medications.  2) Hyperlipidemia: return ASAP when fasting for FLP. Tolerating statin.  3) diabetes screening: fasting gluc when pt returns for FLP.  4) Hypothyroidism; next TSH due 12/2015.  5) CRI, s/p renal transplant: continue nephrology f/u--reviewed most recent o/v note from 03/2015--all stable. Continue prograf, bactrim, and mycophenolate.  6) Ingrown toenail, inflamed at medial aspect. Discussed soaks today.  Instructions: warm water, listerine, hydrogen peroxide, and dove soap 20 min once daily x 7d.  If no improvement I encouraged him to make return appt for me to remove his toenail.  7) Preventative health care: prevnar 13 IM today.  An After Visit Summary was printed  and given to the patient.  FOLLOW UP: Return in about 6 months (around 01/19/2016) for AWE.

## 2015-07-23 ENCOUNTER — Other Ambulatory Visit (INDEPENDENT_AMBULATORY_CARE_PROVIDER_SITE_OTHER): Payer: Medicare HMO

## 2015-07-23 DIAGNOSIS — E785 Hyperlipidemia, unspecified: Secondary | ICD-10-CM

## 2015-07-23 DIAGNOSIS — Z131 Encounter for screening for diabetes mellitus: Secondary | ICD-10-CM

## 2015-07-23 DIAGNOSIS — Z940 Kidney transplant status: Secondary | ICD-10-CM | POA: Diagnosis not present

## 2015-07-23 LAB — LIPID PANEL
Cholesterol: 118 mg/dL (ref 0–200)
HDL: 39.1 mg/dL (ref >39.00)
LDL Cholesterol: 67 mg/dL (ref 0–99)
NONHDL: 78.9
Total CHOL/HDL Ratio: 3
Triglycerides: 61 mg/dL (ref 0.0–149.0)
VLDL: 12.2 mg/dL (ref 0.0–40.0)

## 2015-07-23 LAB — BASIC METABOLIC PANEL
BUN: 22 mg/dL (ref 6–23)
CALCIUM: 9 mg/dL (ref 8.4–10.5)
CO2: 24 meq/L (ref 19–32)
Chloride: 109 mEq/L (ref 96–112)
Creatinine, Ser: 1.68 mg/dL — ABNORMAL HIGH (ref 0.40–1.50)
GFR: 42.36 mL/min — ABNORMAL LOW (ref >60.00)
Glucose, Bld: 98 mg/dL (ref 70–99)
Potassium: 4.2 mEq/L (ref 3.5–5.1)
Sodium: 142 mEq/L (ref 135–145)

## 2015-10-17 DIAGNOSIS — Z940 Kidney transplant status: Secondary | ICD-10-CM | POA: Diagnosis not present

## 2015-10-18 DIAGNOSIS — I129 Hypertensive chronic kidney disease with stage 1 through stage 4 chronic kidney disease, or unspecified chronic kidney disease: Secondary | ICD-10-CM | POA: Diagnosis not present

## 2015-10-18 DIAGNOSIS — Z940 Kidney transplant status: Secondary | ICD-10-CM | POA: Diagnosis not present

## 2015-10-18 DIAGNOSIS — Z23 Encounter for immunization: Secondary | ICD-10-CM | POA: Diagnosis not present

## 2015-10-18 DIAGNOSIS — N186 End stage renal disease: Secondary | ICD-10-CM | POA: Diagnosis not present

## 2015-10-18 DIAGNOSIS — K824 Cholesterolosis of gallbladder: Secondary | ICD-10-CM | POA: Diagnosis not present

## 2015-10-18 DIAGNOSIS — R739 Hyperglycemia, unspecified: Secondary | ICD-10-CM | POA: Diagnosis not present

## 2015-10-21 DIAGNOSIS — R972 Elevated prostate specific antigen [PSA]: Secondary | ICD-10-CM | POA: Diagnosis not present

## 2015-10-21 DIAGNOSIS — R21 Rash and other nonspecific skin eruption: Secondary | ICD-10-CM | POA: Diagnosis not present

## 2015-10-21 DIAGNOSIS — N183 Chronic kidney disease, stage 3 (moderate): Secondary | ICD-10-CM | POA: Diagnosis not present

## 2015-10-21 DIAGNOSIS — Z9489 Other transplanted organ and tissue status: Secondary | ICD-10-CM | POA: Diagnosis not present

## 2015-10-21 DIAGNOSIS — N401 Enlarged prostate with lower urinary tract symptoms: Secondary | ICD-10-CM | POA: Diagnosis not present

## 2015-10-22 ENCOUNTER — Encounter: Payer: Self-pay | Admitting: Family Medicine

## 2015-12-05 DIAGNOSIS — E039 Hypothyroidism, unspecified: Secondary | ICD-10-CM | POA: Diagnosis not present

## 2015-12-05 DIAGNOSIS — R69 Illness, unspecified: Secondary | ICD-10-CM | POA: Diagnosis not present

## 2015-12-05 DIAGNOSIS — D899 Disorder involving the immune mechanism, unspecified: Secondary | ICD-10-CM | POA: Diagnosis not present

## 2015-12-05 DIAGNOSIS — I120 Hypertensive chronic kidney disease with stage 5 chronic kidney disease or end stage renal disease: Secondary | ICD-10-CM | POA: Diagnosis not present

## 2015-12-05 DIAGNOSIS — E785 Hyperlipidemia, unspecified: Secondary | ICD-10-CM | POA: Diagnosis not present

## 2015-12-05 DIAGNOSIS — Z940 Kidney transplant status: Secondary | ICD-10-CM | POA: Diagnosis not present

## 2015-12-05 DIAGNOSIS — N186 End stage renal disease: Secondary | ICD-10-CM | POA: Diagnosis not present

## 2015-12-05 DIAGNOSIS — D8989 Other specified disorders involving the immune mechanism, not elsewhere classified: Secondary | ICD-10-CM | POA: Diagnosis not present

## 2015-12-05 DIAGNOSIS — Z7982 Long term (current) use of aspirin: Secondary | ICD-10-CM | POA: Diagnosis not present

## 2015-12-05 DIAGNOSIS — Z0000 Encounter for general adult medical examination without abnormal findings: Secondary | ICD-10-CM | POA: Diagnosis not present

## 2015-12-05 DIAGNOSIS — Z79899 Other long term (current) drug therapy: Secondary | ICD-10-CM | POA: Diagnosis not present

## 2015-12-05 DIAGNOSIS — Z7902 Long term (current) use of antithrombotics/antiplatelets: Secondary | ICD-10-CM | POA: Diagnosis not present

## 2016-01-19 ENCOUNTER — Other Ambulatory Visit: Payer: Self-pay | Admitting: Family Medicine

## 2016-01-20 DIAGNOSIS — R69 Illness, unspecified: Secondary | ICD-10-CM | POA: Diagnosis not present

## 2016-01-21 DIAGNOSIS — H2513 Age-related nuclear cataract, bilateral: Secondary | ICD-10-CM | POA: Diagnosis not present

## 2016-01-24 ENCOUNTER — Ambulatory Visit: Payer: Medicare HMO | Admitting: Family Medicine

## 2016-01-31 ENCOUNTER — Encounter: Payer: Self-pay | Admitting: Family Medicine

## 2016-01-31 ENCOUNTER — Ambulatory Visit (INDEPENDENT_AMBULATORY_CARE_PROVIDER_SITE_OTHER): Payer: Medicare HMO | Admitting: Family Medicine

## 2016-01-31 VITALS — BP 100/62 | HR 76 | Temp 97.9°F | Resp 16 | Ht 69.0 in | Wt 206.8 lb

## 2016-01-31 DIAGNOSIS — Z0000 Encounter for general adult medical examination without abnormal findings: Secondary | ICD-10-CM

## 2016-01-31 DIAGNOSIS — E039 Hypothyroidism, unspecified: Secondary | ICD-10-CM | POA: Diagnosis not present

## 2016-01-31 LAB — TSH: TSH: 0.751 u[IU]/mL (ref 0.350–4.500)

## 2016-01-31 MED ORDER — SIMVASTATIN 40 MG PO TABS: ORAL_TABLET | ORAL | Status: DC

## 2016-01-31 MED ORDER — AMLODIPINE BESYLATE 5 MG PO TABS: 5.0000 mg | ORAL_TABLET | Freq: Every day | ORAL | Status: DC

## 2016-01-31 MED ORDER — LEVOTHYROXINE SODIUM 25 MCG PO TABS: 25.0000 ug | ORAL_TABLET | Freq: Every day | ORAL | Status: DC

## 2016-01-31 NOTE — Progress Notes (Signed)
The patient is here for annual Medicare wellness examination and management of other chronic and acute problems. Other problems discussed today: needs TSH monitoring for his hypothyroidism.   AWV DATA The risk factors are reflected in the social history.  The roster of all physicians providing medical care to patient is listed in the Snapshot section of the chart.  Activities of daily living:  The patient is 100% independent in all ADLs: dressing, toileting, feeding as well as independent mobility.  Home safety : The patient has smoke detectors in the home. They wear seatbelts. No firearms at home ( firearms are present in the home, kept in a safe fashion). There is no violence in the home.   There is no risks for hepatitis, STDs or HIV. There is no history of blood transfusion. They have no travel history to infectious disease endemic areas of the world.  The patient has seen their dentist in the last six month. They have seen their eye doctor in the last year. They deny any hearing difficulty and have not had audiologic testing in the last year.  They do not  have excessive sun exposure. Discussed the need for sun protection: hats, long sleeves and use of sunscreen if there is significant sun exposure.   Diet: the importance of a healthy diet is discussed. They do have a healthy diet.  The patient has a regular exercise program: silver sneakers + walking.  The benefits of regular aerobic exercise were discussed.  Depression screen: there are no signs or vegative symptoms of depression- irritability, change in appetite, anhedonia, sadness/tearfullness.  Cognitive assessment: the patient manages all their financial and personal affairs and is actively engaged. They could relate day,date,year and events; recalled 3/3 objects at 3 minutes; performed clock-face test normally.  The following portions of the patient's history were reviewed and updated as appropriate: allergies, current  medications, past family history, past medical history,  past surgical history, past social history  and problem list.  Vision, hearing, body mass index were assessed and reviewed.  BMI 30.5.  During the course of the visit the patient was educated and counseled about appropriate screening and preventive services including :  Annual wellness visit  diabetes screening colorectal cancer screening recommended immunizations (influenza, pneumococcal, Hep B) Bone mass measurement Counseling to prevent tobacco use Depression screening Glaucoma screening Hepatitis C virus screening HIV virus screening Lung cancer screening Medical nutrition therapy Prostate cancer screening Screening mammography Screening pap tests, pelvic exam, and clinical breast exam Ultrasound screening for AAA  A written plan of action regarding the above screening and preventative services was given to the patient today. Pt is UTD on all applicable screening/preventative services at this time. He gets Prostate ca screening via his urologist, Dr. Rosana Hoes. CV screening (diab and cholest screening) done 06/2015-normal.  TSH checked today to monitor his hypothyroidism.  F/u 6 mo routine chronic illness f/u.

## 2016-01-31 NOTE — Addendum Note (Signed)
Addended by: Onalee Hua on: 01/31/2016 09:51 AM   Modules accepted: Orders

## 2016-01-31 NOTE — Progress Notes (Signed)
Pre visit review using our clinic review tool, if applicable. No additional management support is needed unless otherwise documented below in the visit note. 

## 2016-01-31 NOTE — Addendum Note (Signed)
Addended by: Onalee Hua on: 01/31/2016 10:17 AM   Modules accepted: Orders

## 2016-02-03 DIAGNOSIS — R69 Illness, unspecified: Secondary | ICD-10-CM | POA: Diagnosis not present

## 2016-02-26 DIAGNOSIS — D1801 Hemangioma of skin and subcutaneous tissue: Secondary | ICD-10-CM | POA: Diagnosis not present

## 2016-02-26 DIAGNOSIS — Z85828 Personal history of other malignant neoplasm of skin: Secondary | ICD-10-CM | POA: Diagnosis not present

## 2016-02-26 DIAGNOSIS — L821 Other seborrheic keratosis: Secondary | ICD-10-CM | POA: Diagnosis not present

## 2016-03-17 DIAGNOSIS — Z940 Kidney transplant status: Secondary | ICD-10-CM | POA: Diagnosis not present

## 2016-03-19 DIAGNOSIS — I129 Hypertensive chronic kidney disease with stage 1 through stage 4 chronic kidney disease, or unspecified chronic kidney disease: Secondary | ICD-10-CM | POA: Diagnosis not present

## 2016-03-19 DIAGNOSIS — R739 Hyperglycemia, unspecified: Secondary | ICD-10-CM | POA: Diagnosis not present

## 2016-03-19 DIAGNOSIS — K824 Cholesterolosis of gallbladder: Secondary | ICD-10-CM | POA: Diagnosis not present

## 2016-03-19 DIAGNOSIS — Z940 Kidney transplant status: Secondary | ICD-10-CM | POA: Diagnosis not present

## 2016-03-19 DIAGNOSIS — M109 Gout, unspecified: Secondary | ICD-10-CM | POA: Diagnosis not present

## 2016-03-19 DIAGNOSIS — N186 End stage renal disease: Secondary | ICD-10-CM | POA: Diagnosis not present

## 2016-03-26 ENCOUNTER — Encounter: Payer: Self-pay | Admitting: Family Medicine

## 2016-03-31 ENCOUNTER — Telehealth: Payer: Self-pay | Admitting: Family Medicine

## 2016-03-31 NOTE — Telephone Encounter (Signed)
Patient is having an acute gout attack. He needs Colchicine Rx to be sent to [facility]. Patient states he only needs about 8 tablets.

## 2016-04-01 MED ORDER — COLCHICINE 0.6 MG PO TABS: ORAL_TABLET | ORAL | Status: DC

## 2016-04-01 NOTE — Telephone Encounter (Signed)
Pls notify pt I sent his colchicine in to pharmacy.

## 2016-04-01 NOTE — Telephone Encounter (Signed)
Pt advised and voiced understanding.   

## 2016-04-22 DIAGNOSIS — E039 Hypothyroidism, unspecified: Secondary | ICD-10-CM | POA: Diagnosis not present

## 2016-04-22 DIAGNOSIS — I10 Essential (primary) hypertension: Secondary | ICD-10-CM | POA: Diagnosis not present

## 2016-04-22 DIAGNOSIS — Z0000 Encounter for general adult medical examination without abnormal findings: Secondary | ICD-10-CM | POA: Diagnosis not present

## 2016-04-22 DIAGNOSIS — K219 Gastro-esophageal reflux disease without esophagitis: Secondary | ICD-10-CM | POA: Diagnosis not present

## 2016-04-22 DIAGNOSIS — E7800 Pure hypercholesterolemia, unspecified: Secondary | ICD-10-CM | POA: Diagnosis not present

## 2016-04-22 DIAGNOSIS — R69 Illness, unspecified: Secondary | ICD-10-CM | POA: Diagnosis not present

## 2016-04-22 DIAGNOSIS — Z940 Kidney transplant status: Secondary | ICD-10-CM | POA: Diagnosis not present

## 2016-04-24 DIAGNOSIS — N401 Enlarged prostate with lower urinary tract symptoms: Secondary | ICD-10-CM | POA: Diagnosis not present

## 2016-04-24 DIAGNOSIS — N183 Chronic kidney disease, stage 3 (moderate): Secondary | ICD-10-CM | POA: Diagnosis not present

## 2016-04-24 DIAGNOSIS — R972 Elevated prostate specific antigen [PSA]: Secondary | ICD-10-CM | POA: Diagnosis not present

## 2016-04-24 DIAGNOSIS — Z9489 Other transplanted organ and tissue status: Secondary | ICD-10-CM | POA: Diagnosis not present

## 2016-04-29 ENCOUNTER — Ambulatory Visit (INDEPENDENT_AMBULATORY_CARE_PROVIDER_SITE_OTHER): Payer: Medicare HMO | Admitting: Family Medicine

## 2016-04-29 VITALS — BP 118/70 | HR 76 | Temp 98.0°F | Resp 16 | Ht 69.0 in | Wt 206.0 lb

## 2016-04-29 DIAGNOSIS — S61011A Laceration without foreign body of right thumb without damage to nail, initial encounter: Secondary | ICD-10-CM

## 2016-04-29 NOTE — Progress Notes (Signed)
Pt here with right thumb laceration after opening tin can today @ 4pm. Minimal bleeding controlled with gauze Up to date on T-dap 2015 Denies pain    Symptomatic: History as noted above  Objective: Patient has a 1 cm slightly gaping laceration of his right thumb which is dry.  After informed consent, the wound was cleansed with isopropyl alcohol and closed with Dermabond. A sterile dressing was then applied.  Assessment: Simple laceration of right thumb, closed with Dermabond.  Plan: Return as necessary if there is any sign of infection  Signed, Carola Frost.D.

## 2016-04-29 NOTE — Patient Instructions (Signed)
I would keep the dressing on for the next 24 hours. After that you can bathe and wash as you normally do.  Please keep an eye out for infection such as redness or increasing pain.

## 2016-06-17 DIAGNOSIS — Z940 Kidney transplant status: Secondary | ICD-10-CM | POA: Diagnosis not present

## 2016-06-26 DIAGNOSIS — Z940 Kidney transplant status: Secondary | ICD-10-CM | POA: Diagnosis not present

## 2016-07-02 ENCOUNTER — Telehealth: Payer: Self-pay

## 2016-07-02 NOTE — Telephone Encounter (Signed)
Patient is on the list for [employer] 2017 and may be a good candidate for an AWV in 2017. Please let me know if/when appt is scheduled.    Note: Pt has an appt in Oct but it is for a follow up.

## 2016-07-02 NOTE — Telephone Encounter (Signed)
LM for pt to CB to schedule appt

## 2016-07-02 NOTE — Telephone Encounter (Signed)
Patient scheduled 07/23/16

## 2016-07-02 NOTE — Telephone Encounter (Signed)
Please set up AWV and let Stefannie know once pt is scheduled. Thank you.

## 2016-07-03 DIAGNOSIS — Z940 Kidney transplant status: Secondary | ICD-10-CM | POA: Diagnosis not present

## 2016-07-06 DIAGNOSIS — K824 Cholesterolosis of gallbladder: Secondary | ICD-10-CM | POA: Diagnosis not present

## 2016-07-06 DIAGNOSIS — M109 Gout, unspecified: Secondary | ICD-10-CM | POA: Diagnosis not present

## 2016-07-06 DIAGNOSIS — E669 Obesity, unspecified: Secondary | ICD-10-CM | POA: Diagnosis not present

## 2016-07-06 DIAGNOSIS — I129 Hypertensive chronic kidney disease with stage 1 through stage 4 chronic kidney disease, or unspecified chronic kidney disease: Secondary | ICD-10-CM | POA: Diagnosis not present

## 2016-07-06 DIAGNOSIS — Z940 Kidney transplant status: Secondary | ICD-10-CM | POA: Diagnosis not present

## 2016-07-06 DIAGNOSIS — R739 Hyperglycemia, unspecified: Secondary | ICD-10-CM | POA: Diagnosis not present

## 2016-07-06 DIAGNOSIS — N186 End stage renal disease: Secondary | ICD-10-CM | POA: Diagnosis not present

## 2016-07-09 ENCOUNTER — Encounter: Payer: Self-pay | Admitting: Family Medicine

## 2016-07-21 DIAGNOSIS — R69 Illness, unspecified: Secondary | ICD-10-CM | POA: Diagnosis not present

## 2016-07-21 NOTE — Telephone Encounter (Signed)
Curtis Jimenez, can you see if there was a charge for a [place] done on 01/31/16. When I look at Dr. Idelle Leech progress note for that visit it looks like he did a MWV, if so please call pt to cancel apt for 07/23/16. Thanks.

## 2016-07-22 NOTE — Telephone Encounter (Signed)
Good catch, I've changed the appt type

## 2016-07-23 ENCOUNTER — Encounter: Payer: Self-pay | Admitting: Family Medicine

## 2016-07-23 ENCOUNTER — Ambulatory Visit (INDEPENDENT_AMBULATORY_CARE_PROVIDER_SITE_OTHER): Payer: Medicare HMO | Admitting: Family Medicine

## 2016-07-23 VITALS — BP 106/69 | HR 76 | Temp 98.0°F | Resp 16 | Ht 69.5 in | Wt 206.0 lb

## 2016-07-23 DIAGNOSIS — Z0000 Encounter for general adult medical examination without abnormal findings: Secondary | ICD-10-CM

## 2016-07-23 DIAGNOSIS — E785 Hyperlipidemia, unspecified: Secondary | ICD-10-CM

## 2016-07-23 LAB — COMPREHENSIVE METABOLIC PANEL
ALK PHOS: 56 U/L (ref 40–115)
ALT: 13 U/L (ref 9–46)
AST: 15 U/L (ref 10–35)
Albumin: 3.8 g/dL (ref 3.6–5.1)
BILIRUBIN TOTAL: 0.5 mg/dL (ref 0.2–1.2)
BUN: 21 mg/dL (ref 7–25)
CALCIUM: 8.8 mg/dL (ref 8.6–10.3)
CO2: 22 mmol/L (ref 20–31)
CREATININE: 1.84 mg/dL — AB (ref 0.70–1.18)
Chloride: 109 mmol/L (ref 98–110)
GLUCOSE: 102 mg/dL — AB (ref 65–99)
Potassium: 4.5 mmol/L (ref 3.5–5.3)
SODIUM: 142 mmol/L (ref 135–146)
Total Protein: 6.1 g/dL (ref 6.1–8.1)

## 2016-07-23 LAB — LIPID PANEL
CHOLESTEROL: 114 mg/dL — AB (ref 125–200)
HDL: 45 mg/dL (ref >=40)
LDL Cholesterol: 54 mg/dL (ref <130)
Total CHOL/HDL Ratio: 2.5 Ratio (ref <=5.0)
Triglycerides: 73 mg/dL (ref <150)
VLDL: 15 mg/dL (ref <30)

## 2016-07-23 MED ORDER — CARVEDILOL 6.25 MG PO TABS: 6.2500 mg | ORAL_TABLET | Freq: Two times a day (BID) | ORAL | 3 refills | Status: DC

## 2016-07-23 MED ORDER — ALLOPURINOL 100 MG PO TABS: 300.0000 mg | ORAL_TABLET | Freq: Every day | ORAL | 4 refills | Status: DC

## 2016-07-23 MED ORDER — OMEPRAZOLE 20 MG PO CPDR: 20.0000 mg | DELAYED_RELEASE_CAPSULE | Freq: Every day | ORAL | 3 refills | Status: DC

## 2016-07-23 NOTE — Progress Notes (Signed)
Office Note 07/23/2016  CC:  Chief Complaint  Patient presents with  . Annual Exam    Pt is fasting.     HPI:  Curtis Jimenez is a 80 y.o. White male who is here for annual health maintenance exam.  No acute complaints.     Past Medical History:  Diagnosis Date  . Anemia   . Arthritis   . Basal cell carcinoma    neck (skin MD 2X per year)  . Bipolar disorder ([place])   . BPH (benign prostatic hyperplasia)    with elevated PSA; followed by Dr. Rosana Hoes at [facility]  . Cataract   . Chronic renal insufficiency, stage III (moderate)    Last f/u with neph 07/06/16: Cr stable at 1.57 (GFR 42)  . Gallbladder polyp 2015   Asymptomatic  . GERD (gastroesophageal reflux disease)   . Gout   . Hyperlipidemia   . Hypertension   . Hypothyroidism   . Hypothyroidism   . Restless legs syndrome   . S/p cadaver renal transplant 2013   Secondary to HTN +lithium toxicity over 30 yrs caused kidney destruction [facility] transplant MDs)    Past Surgical History:  Procedure Laterality Date  . AV FISTULA PLACEMENT  04/08/2012   Procedure: ARTERIOVENOUS (AV) FISTULA CREATION;  Surgeon: Angelia Mould, MD;  Location: [facility] OR;  Service: Vascular;  Laterality: Left;  Creation of left brachial cephalic arteriovenous fistula  . COLONOSCOPY  2013   Normal except diverticulosis (recall 10 yrs)  . dilation for GERD    . KIDNEY TRANSPLANT  11/06/12   [place] (cadaveric)  . PROSTATE BIOPSY  2011   no malignancy    Family History  Problem Relation Age of Onset  . Cancer Father     BLADDER  . Heart disease Father     Social History   Social History  . Marital status: Married    Spouse name: N/A  . Number of children: N/A  . Years of education: N/A   Occupational History  . Not on file.   Social History Main Topics  . Smoking status: Former Smoker    Years: 16.00    Types: Pipe, Cigars    Quit date: 05/11/1976  . Smokeless tobacco: Never Used  . Alcohol use No  . Drug  use: No  . Sexual activity: Not on file   Other Topics Concern  . Not on file   Social History Narrative   Married, 4 children, 9 GGC, Henry.   Occupation: retired Marine scientist.  Originally from [place].   Tobacco: quit 1975; smoked pipes and cigars x 15 yrs prior to this.   Alcohol: none.   Exercise: minimal, but is going to sign up for silver sneakers at the [facility].    Outpatient Medications Prior to Visit  Medication Sig Dispense Refill  . amLODipine (NORVASC) 5 MG tablet Take 1 tablet (5 mg total) by mouth daily. 90 tablet 3  . aspirin 81 MG chewable tablet Chew 81 mg by mouth.    . Cholecalciferol (VITAMIN D) 2000 UNITS CAPS Take 1 capsule by mouth daily.    . colchicine 0.6 MG tablet 2 tabs at onset of gout flare, then 1 tab one hour later, then starting the next day take 1 tab bid until gout flare is resolved 20 tablet 1  . hydrOXYzine (ATARAX/VISTARIL) 25 MG tablet Take 20 mg by mouth 3 (three) times daily.     Marland Kitchen levothyroxine (SYNTHROID, LEVOTHROID) 25 MCG tablet Take 1 tablet (25  mcg total) by mouth daily. 90 tablet 3  . mycophenolate (MYFORTIC) 180 MG EC tablet Take 720 mg by mouth daily.     . Omega-3 1000 MG CAPS Take 2 g by mouth 2 (two) times daily as needed.    . risperiDONE (RISPERDAL) 2 MG tablet Take 1/2 tablet daily.    . simvastatin (ZOCOR) 40 MG tablet 1/2 tab po qd 45 tablet 3  . sulfamethoxazole-trimethoprim (BACTRIM,SEPTRA) 400-80 MG per tablet Take 1 tablet by mouth 3 (three) times a week. Reported on 04/29/2016    . tacrolimus (PROGRAF) 1 MG capsule Take 1 1/2 in the AM and 1 in the PM    . thiamine (VITAMIN B-1) 100 MG tablet Take 100 mg by mouth.    Marland Kitchen allopurinol (ZYLOPRIM) 100 MG tablet Take 3 tablets (300 mg total) by mouth daily. 90 tablet 4  . carvedilol (COREG) 6.25 MG tablet Take 6.25 mg by mouth 2 (two) times daily with a meal.    . omeprazole (PRILOSEC) 20 MG capsule Take 20 mg by mouth daily.       No facility-administered medications prior to  visit.     Allergies  Allergen Reactions  . Amantadine Hcl     REACTION: itching  . Chlorpromazine Hcl Other (See Comments)    REACTION: "fell flat on face"    ROS Review of Systems  Constitutional: Negative for appetite change, chills, fatigue and fever.  HENT: Negative for congestion, dental problem, ear pain and sore throat.   Eyes: Negative for discharge, redness and visual disturbance.  Respiratory: Negative for cough, chest tightness, shortness of breath and wheezing.   Cardiovascular: Negative for chest pain, palpitations and leg swelling.  Gastrointestinal: Negative for abdominal pain, blood in stool, diarrhea, nausea and vomiting.  Genitourinary: Negative for difficulty urinating, dysuria, flank pain, frequency, hematuria and urgency.  Musculoskeletal: Negative for arthralgias, back pain, joint swelling, myalgias and neck stiffness.  Skin: Negative for pallor and rash.  Neurological: Negative for dizziness, speech difficulty, weakness and headaches.  Hematological: Negative for adenopathy. Does not bruise/bleed easily.  Psychiatric/Behavioral: Negative for confusion and sleep disturbance. The patient is not nervous/anxious.     PE; Blood pressure 106/69, pulse 76, temperature 98 F (36.7 C), temperature source Oral, resp. rate 16, height 5' 9.5" (1.765 m), weight 206 lb (93.4 kg), SpO2 94 %. Gen: Alert, well appearing.  Patient is oriented to person, place, time, and situation. AFFECT: pleasant, lucid thought and speech. ENT: Ears: EACs clear, normal epithelium.  TMs with good light reflex and landmarks bilaterally.  Eyes: no injection, icteris, swelling, or exudate.  EOMI, PERRLA. Nose: no drainage or turbinate edema/swelling.  No injection or focal lesion.  Mouth: lips without lesion/swelling.  Oral mucosa pink and moist.  Dentition intact and without obvious caries or gingival swelling.  Oropharynx without erythema, exudate, or swelling.  Neck: supple/nontender.  No LAD,  mass, or TM.  Carotid pulses 2+ bilaterally, without bruits. CV: RRR, no m/r/g.   LUNGS: CTA bilat, nonlabored resps, good aeration in all lung fields. ABD: soft, NT, ND, BS normal.  No hepatospenomegaly or mass.  No bruits. EXT: no clubbing, cyanosis, or edema.  Musculoskeletal: no joint swelling, erythema, warmth, or tenderness.  ROM of all joints intact. Skin - no sores or suspicious lesions or rashes or color changes   Pertinent labs:   Lab Results  Component Value Date   TSH 0.751 01/31/2016   Lab Results  Component Value Date   WBC 8.3 09/19/2012  HGB 12.5 (L) 09/19/2012   HCT 38.2 (L) 09/19/2012   MCV 88.6 09/19/2012   PLT 279.0 09/19/2012   Lab Results  Component Value Date   CREATININE 1.68 (H) 07/23/2015   BUN 22 07/23/2015   NA 142 07/23/2015   K 4.2 07/23/2015   CL 109 07/23/2015   CO2 24 07/23/2015   Lab Results  Component Value Date   ALT 14 06/11/2014   AST 16 06/11/2014   ALKPHOS 50 06/11/2014   BILITOT 0.5 06/11/2014   Lab Results  Component Value Date   CHOL 118 07/23/2015   Lab Results  Component Value Date   HDL 39.10 07/23/2015   Lab Results  Component Value Date   LDLCALC 67 07/23/2015   Lab Results  Component Value Date   TRIG 61.0 07/23/2015   Lab Results  Component Value Date   CHOLHDL 3 07/23/2015   Lab Results  Component Value Date   PSA 3.51 03/28/2008   PSA 4.83 (H) 12/02/2007    ASSESSMENT AND PLAN:   Health maintenance exam:  Reviewed age and gender appropriate health maintenance issues (prudent diet, regular exercise, health risks of tobacco and excessive alcohol, use of seatbelts, fire alarms in home, use of sunscreen).  Also reviewed age and gender appropriate health screening as well as vaccine recommendations. Colon ca screening: he says he prefers to have no further colon ca screening. Prostate ca screening: he gets DRE and PSA through urology annually. Vaccines UTD. Labs today: CMET and FLP.  An After  Visit Summary was printed and given to the patient.   FOLLOW UP:  Return in about 6 months (around 01/23/2017) for routine chronic illness f/u.  Signed:  Crissie Sickles, MD           07/23/2016

## 2016-07-23 NOTE — Progress Notes (Signed)
Pre visit review using our clinic review tool, if applicable. No additional management support is needed unless otherwise documented below in the visit note. 

## 2016-07-28 ENCOUNTER — Ambulatory Visit: Payer: Medicare HMO | Admitting: Family Medicine

## 2016-08-14 ENCOUNTER — Ambulatory Visit (INDEPENDENT_AMBULATORY_CARE_PROVIDER_SITE_OTHER): Payer: Medicare HMO | Admitting: Internal Medicine

## 2016-08-14 DIAGNOSIS — Z9189 Other specified personal risk factors, not elsewhere classified: Secondary | ICD-10-CM

## 2016-08-14 DIAGNOSIS — Z0000 Encounter for general adult medical examination without abnormal findings: Secondary | ICD-10-CM

## 2016-08-14 DIAGNOSIS — IMO0002 Reserved for concepts with insufficient information to code with codable children: Secondary | ICD-10-CM

## 2016-08-14 DIAGNOSIS — Z7189 Other specified counseling: Secondary | ICD-10-CM

## 2016-08-14 DIAGNOSIS — Z789 Other specified health status: Secondary | ICD-10-CM | POA: Diagnosis not present

## 2016-08-14 DIAGNOSIS — Z23 Encounter for immunization: Secondary | ICD-10-CM

## 2016-08-14 MED ORDER — AZITHROMYCIN 500 MG PO TABS: 500.0000 mg | ORAL_TABLET | Freq: Every day | ORAL | 0 refills | Status: DC

## 2016-08-14 NOTE — Progress Notes (Signed)
  RFV= pre travel to [place] in january Subjective:    Patient ID: Curtis Jimenez, male    DOB: [birth year], 80 y.o.   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 Currently on Prograf and myfortic maintenance immunosuppression. Bactrim DS TID for proph. Baseline Creatinine 1.4-1.7. He is followed regularly by Dr. Mercy Moore. He will be traveling in January with his partner to visit family/friends in [place] for 2 months.  He is uptodate on vaccines per transplant work up. Prior to txp he was non immune to hep b, hep a, negative for hep c, hiv  Prior travel -went to [place]   Review of Systems     Objective:   Physical Exam        Assessment & Plan:  Pre travel vaccines- will give hep a #1 and typhoid injection. Recommend to get flu vaccine this year  Travelers diarrhea- will give azithro to use if needed for TD. Gave precautions  Mosquito bite prevention = can use deet and premethrin. No need for yellow fever vaccination, as well as being contra-indicated

## 2016-09-15 DIAGNOSIS — L821 Other seborrheic keratosis: Secondary | ICD-10-CM | POA: Diagnosis not present

## 2016-09-15 DIAGNOSIS — L570 Actinic keratosis: Secondary | ICD-10-CM | POA: Diagnosis not present

## 2016-09-15 DIAGNOSIS — L218 Other seborrheic dermatitis: Secondary | ICD-10-CM | POA: Diagnosis not present

## 2016-09-15 DIAGNOSIS — L814 Other melanin hyperpigmentation: Secondary | ICD-10-CM | POA: Diagnosis not present

## 2016-09-15 DIAGNOSIS — Z85828 Personal history of other malignant neoplasm of skin: Secondary | ICD-10-CM | POA: Diagnosis not present

## 2016-09-15 DIAGNOSIS — D485 Neoplasm of uncertain behavior of skin: Secondary | ICD-10-CM | POA: Diagnosis not present

## 2016-09-15 DIAGNOSIS — D1801 Hemangioma of skin and subcutaneous tissue: Secondary | ICD-10-CM | POA: Diagnosis not present

## 2016-09-18 DIAGNOSIS — Z940 Kidney transplant status: Secondary | ICD-10-CM | POA: Diagnosis not present

## 2016-09-29 ENCOUNTER — Ambulatory Visit (INDEPENDENT_AMBULATORY_CARE_PROVIDER_SITE_OTHER): Payer: Medicare HMO | Admitting: Family Medicine

## 2016-09-29 ENCOUNTER — Encounter: Payer: Self-pay | Admitting: Family Medicine

## 2016-09-29 VITALS — BP 98/61 | HR 74 | Temp 98.0°F | Resp 18 | Wt 206.4 lb

## 2016-09-29 DIAGNOSIS — I951 Orthostatic hypotension: Secondary | ICD-10-CM

## 2016-09-29 MED ORDER — CARVEDILOL 3.125 MG PO TABS: 3.1250 mg | ORAL_TABLET | Freq: Two times a day (BID) | ORAL | 3 refills | Status: DC

## 2016-09-29 NOTE — Progress Notes (Signed)
Pre visit review using our clinic review tool, if applicable. No additional management support is needed unless otherwise documented below in the visit note. 

## 2016-09-29 NOTE — Progress Notes (Signed)
OFFICE VISIT  09/29/2016   CC:  Chief Complaint  Patient presents with  . Follow-up    Blood pressure dropping   HPI:    Patient is a 80 y.o. Caucasian male who presents for low blood pressure readings at home. Has felt intermittent orthostatic dizziness for months now, just mild.  Yesterday felt somewhat dehydrated and almost passed out after he had been standing for 20 min.  He sat and felt "fuzzy" and hands were clammy.  No CP , no SOB, no palpitations.  He did not lose consciousness or fall. An ambulance was called and bp check was 110/60.  He felt a bit better by that time.  He took only 1/2 of his amlodipine dose last night. BP this morning before taking meds was 503T systolic per pt.  His nephrologist has called him today and advised him to d/c his amlodipine.  No melena, hematochezia, or other bleeding noted.  Past Medical History:  Diagnosis Date  . Anemia   . Arthritis   . Basal cell carcinoma    neck (skin MD 2X per year)  . Bipolar disorder ([place])   . BPH (benign prostatic hyperplasia)    with elevated PSA; followed by Dr. Rosana Hoes at [facility]  . Cataract   . Chronic renal insufficiency, stage III (moderate)    Last f/u with neph 07/06/16: Cr stable at 1.57 (GFR 42)  . Gallbladder polyp 2015   Asymptomatic  . GERD (gastroesophageal reflux disease)   . Gout   . Hyperlipidemia   . Hypertension   . Hypothyroidism   . Restless legs syndrome   . S/p cadaver renal transplant 2013   Secondary to HTN +lithium toxicity over 30 yrs caused kidney destruction [facility] transplant MDs)    Past Surgical History:  Procedure Laterality Date  . AV FISTULA PLACEMENT  04/08/2012   Procedure: ARTERIOVENOUS (AV) FISTULA CREATION;  Surgeon: Angelia Mould, MD;  Location: [facility] OR;  Service: Vascular;  Laterality: Left;  Creation of left brachial cephalic arteriovenous fistula  . COLONOSCOPY  2013   Normal except diverticulosis (recall 10 yrs)  . dilation for GERD    .  KIDNEY TRANSPLANT  11/06/12   [place] (cadaveric)  . PROSTATE BIOPSY  2011   no malignancy    Outpatient Medications Prior to Visit  Medication Sig Dispense Refill  . allopurinol (ZYLOPRIM) 100 MG tablet Take 3 tablets (300 mg total) by mouth daily. 90 tablet 4  . aspirin 81 MG chewable tablet Chew 81 mg by mouth.    . Cholecalciferol (VITAMIN D) 2000 UNITS CAPS Take 1 capsule by mouth daily.    . colchicine 0.6 MG tablet 2 tabs at onset of gout flare, then 1 tab one hour later, then starting the next day take 1 tab bid until gout flare is resolved 20 tablet 1  . hydrOXYzine (ATARAX/VISTARIL) 25 MG tablet Take 20 mg by mouth 3 (three) times daily.     Marland Kitchen levothyroxine (SYNTHROID, LEVOTHROID) 25 MCG tablet Take 1 tablet (25 mcg total) by mouth daily. 90 tablet 3  . mycophenolate (MYFORTIC) 180 MG EC tablet Take 720 mg by mouth daily.     . Omega-3 1000 MG CAPS Take 2 g by mouth 2 (two) times daily as needed.    Marland Kitchen omeprazole (PRILOSEC) 20 MG capsule Take 1 capsule (20 mg total) by mouth daily. 90 capsule 3  . risperiDONE (RISPERDAL) 2 MG tablet Take 1/2 tablet daily.    . simvastatin (ZOCOR) 40 MG  tablet 1/2 tab po qd 45 tablet 3  . tacrolimus (PROGRAF) 1 MG capsule Take 1 1/2 in the AM and 1 in the PM    . thiamine (VITAMIN B-1) 100 MG tablet Take 100 mg by mouth.    Marland Kitchen amLODipine (NORVASC) 5 MG tablet Take 1 tablet (5 mg total) by mouth daily. 90 tablet 3  . carvedilol (COREG) 6.25 MG tablet Take 1 tablet (6.25 mg total) by mouth 2 (two) times daily with a meal. 180 tablet 3  . azithromycin (ZITHROMAX) 500 MG tablet Take 1 tablet (500 mg total) by mouth daily. If you have 3+ loose stools/24hr. Can stop taking if diarrhea resolves 5 tablet 0  . sulfamethoxazole-trimethoprim (BACTRIM,SEPTRA) 400-80 MG per tablet Take 1 tablet by mouth 3 (three) times a week. Reported on 04/29/2016     No facility-administered medications prior to visit.     Allergies  Allergen Reactions  . Amantadine Hcl      REACTION: itching  . Chlorpromazine Hcl Other (See Comments)    REACTION: "fell flat on face"    ROS As per HPI  PE: Blood pressure 98/61, pulse 74, temperature 98 F (36.7 C), temperature source Oral, resp. rate 18, weight 206 lb 6.4 oz (93.6 kg), SpO2 96 %. Gen: Alert, well appearing.  Patient is oriented to person, place, time, and situation. FGH:WEXH: no injection, icteris, swelling, or exudate.  EOMI, PERRLA. Mouth: lips without lesion/swelling.  Oral mucosa pink and moist. Oropharynx without erythema, exudate, or swelling.  Neck: supple/nontender.  No LAD, mass, or TM.  Carotid pulses 2+ bilaterally, without bruits. CV: RRR, no m/r/g.   LUNGS: CTA bilat, nonlabored resps, good aeration in all lung fields. EXT: no clubbing, cyanosis, or edema.    LABS:  Lab Results  Component Value Date   TSH 0.751 01/31/2016   Lab Results  Component Value Date   WBC 8.3 09/19/2012   HGB 12.5 (L) 09/19/2012   HCT 38.2 (L) 09/19/2012   MCV 88.6 09/19/2012   PLT 279.0 09/19/2012   Lab Results  Component Value Date   CREATININE 1.84 (H) 07/23/2016   BUN 21 07/23/2016   NA 142 07/23/2016   K 4.5 07/23/2016   CL 109 07/23/2016   CO2 22 07/23/2016   Lab Results  Component Value Date   ALT 13 07/23/2016   AST 15 07/23/2016   ALKPHOS 56 07/23/2016   BILITOT 0.5 07/23/2016    Lab Results  Component Value Date   CHOL 114 (L) 07/23/2016   Lab Results  Component Value Date   HDL 45 07/23/2016   Lab Results  Component Value Date   LDLCALC 54 07/23/2016   Lab Results  Component Value Date   TRIG 73 07/23/2016   Lab Results  Component Value Date   CHOLHDL 2.5 07/23/2016     IMPRESSION AND PLAN:  Hypotension.  He has some orthostatic dizziness.  He does need to increase fluid intake to about 2 L per day. D/C amlodipine and decrease coreg to 3.125mg  bid.  Goal bp <130/80 but we will likely err a bit on the high side if we have to, rather than put him at risk of  orthostatic syncope.  An After Visit Summary was printed and given to the patient.  FOLLOW UP: Return in about 1 week (around 10/06/2016) for f/u hypotension/presyncope.  Signed:  Crissie Sickles, MD           09/29/2016

## 2016-10-06 ENCOUNTER — Ambulatory Visit: Payer: Medicare HMO | Admitting: Family Medicine

## 2016-10-06 DIAGNOSIS — L570 Actinic keratosis: Secondary | ICD-10-CM | POA: Diagnosis not present

## 2016-10-07 ENCOUNTER — Encounter: Payer: Self-pay | Admitting: Family Medicine

## 2016-10-07 ENCOUNTER — Ambulatory Visit (INDEPENDENT_AMBULATORY_CARE_PROVIDER_SITE_OTHER): Payer: Medicare HMO | Admitting: Family Medicine

## 2016-10-07 VITALS — BP 107/68 | HR 68 | Temp 97.9°F | Resp 16 | Wt 203.1 lb

## 2016-10-07 DIAGNOSIS — I951 Orthostatic hypotension: Secondary | ICD-10-CM

## 2016-10-07 DIAGNOSIS — I10 Essential (primary) hypertension: Secondary | ICD-10-CM | POA: Diagnosis not present

## 2016-10-07 MED ORDER — ALLOPURINOL 300 MG PO TABS: 300.0000 mg | ORAL_TABLET | Freq: Every day | ORAL | 3 refills | Status: DC

## 2016-10-07 MED ORDER — CARVEDILOL 6.25 MG PO TABS: 6.2500 mg | ORAL_TABLET | Freq: Two times a day (BID) | ORAL | 3 refills | Status: DC

## 2016-10-07 NOTE — Progress Notes (Signed)
Pre visit review using our clinic review tool, if applicable. No additional management support is needed unless otherwise documented below in the visit note. 

## 2016-10-07 NOTE — Progress Notes (Signed)
OFFICE VISIT  10/07/2016   CC:  Chief Complaint  Patient presents with  . Follow-up    HTN     HPI:    Patient is a 80 y.o. Caucasian male who presents for 1 week f/u low blood pressure. Last visit he was instructed to increase fluid intake, d/c amlodipine, and decrease coreg to 3.125mg  bid. BP's came back up, up to 150s on one occ so he increased coreg back to 6.25mg  bid.   BP's consistently 140/70s and he feels no orthostatic dizziness.   Past Medical History:  Diagnosis Date  . Anemia   . Arthritis   . Basal cell carcinoma    neck (skin MD 2X per year)  . Bipolar disorder ([place])   . BPH (benign prostatic hyperplasia)    with elevated PSA; followed by Dr. Rosana Hoes at [facility]  . Cataract   . Chronic renal insufficiency, stage III (moderate)    Last f/u with neph 07/06/16: Cr stable at 1.57 (GFR 42)  . Gallbladder polyp 2015   Asymptomatic  . GERD (gastroesophageal reflux disease)   . Gout   . Hyperlipidemia   . Hypertension   . Hypothyroidism   . Restless legs syndrome   . S/p cadaver renal transplant 2013   Secondary to HTN +lithium toxicity over 30 yrs caused kidney destruction [facility] transplant MDs)    Past Surgical History:  Procedure Laterality Date  . AV FISTULA PLACEMENT  04/08/2012   Procedure: ARTERIOVENOUS (AV) FISTULA CREATION;  Surgeon: Angelia Mould, MD;  Location: [facility] OR;  Service: Vascular;  Laterality: Left;  Creation of left brachial cephalic arteriovenous fistula  . COLONOSCOPY  2013   Normal except diverticulosis (recall 10 yrs)  . dilation for GERD    . KIDNEY TRANSPLANT  11/06/12   [place] (cadaveric)  . PROSTATE BIOPSY  2011   no malignancy    Outpatient Medications Prior to Visit  Medication Sig Dispense Refill  . aspirin 81 MG chewable tablet Chew 81 mg by mouth.    . Cholecalciferol (VITAMIN D) 2000 UNITS CAPS Take 1 capsule by mouth daily.    . colchicine 0.6 MG tablet 2 tabs at onset of gout flare, then 1 tab one hour  later, then starting the next day take 1 tab bid until gout flare is resolved 20 tablet 1  . hydrOXYzine (ATARAX/VISTARIL) 25 MG tablet Take 20 mg by mouth 3 (three) times daily.     Marland Kitchen levothyroxine (SYNTHROID, LEVOTHROID) 25 MCG tablet Take 1 tablet (25 mcg total) by mouth daily. 90 tablet 3  . mycophenolate (MYFORTIC) 180 MG EC tablet Take 720 mg by mouth daily.     . Omega-3 1000 MG CAPS Take 2 g by mouth 2 (two) times daily as needed.    Marland Kitchen omeprazole (PRILOSEC) 20 MG capsule Take 1 capsule (20 mg total) by mouth daily. 90 capsule 3  . risperiDONE (RISPERDAL) 2 MG tablet Take 1/2 tablet daily.    . simvastatin (ZOCOR) 40 MG tablet 1/2 tab po qd 45 tablet 3  . tacrolimus (PROGRAF) 1 MG capsule Take 1 1/2 in the AM and 1 in the PM    . thiamine (VITAMIN B-1) 100 MG tablet Take 100 mg by mouth.    Marland Kitchen allopurinol (ZYLOPRIM) 100 MG tablet Take 3 tablets (300 mg total) by mouth daily. 90 tablet 4  . carvedilol (COREG) 3.125 MG tablet Take 1 tablet (3.125 mg total) by mouth 2 (two) times daily with a meal. 60 tablet 3  No facility-administered medications prior to visit.     Allergies  Allergen Reactions  . Amantadine Hcl     REACTION: itching  . Chlorpromazine Hcl Other (See Comments)    REACTION: "fell flat on face"    ROS As per HPI  PE: Blood pressure 107/68, pulse 68, temperature 97.9 F (36.6 C), temperature source Oral, resp. rate 16, weight 203 lb 1.9 oz (92.1 kg), SpO2 95 %. Gen: Alert, well appearing.  Patient is oriented to person, place, time, and situation. AFFECT: pleasant, lucid thought and speech. CV: RRR, no m/r/g.   LUNGS: CTA bilat, nonlabored resps, good aeration in all lung fields.   LABS:  none  IMPRESSION AND PLAN:  Essential hypertension: was having some orthostatic hypotension. BP well controlled now on coreg 6.25 mg bid.  We'll leave him OFF amlodipine. Doing well now, asymptomatic.  An After Visit Summary was printed and given to the  patient.  FOLLOW UP: Return for keep appt scheduled for 01/28/17.  Signed:  Crissie Sickles, MD           10/07/2016

## 2016-10-19 DIAGNOSIS — N138 Other obstructive and reflux uropathy: Secondary | ICD-10-CM | POA: Diagnosis not present

## 2016-10-19 DIAGNOSIS — R972 Elevated prostate specific antigen [PSA]: Secondary | ICD-10-CM | POA: Diagnosis not present

## 2016-10-19 DIAGNOSIS — N401 Enlarged prostate with lower urinary tract symptoms: Secondary | ICD-10-CM | POA: Diagnosis not present

## 2016-10-19 DIAGNOSIS — Z9489 Other transplanted organ and tissue status: Secondary | ICD-10-CM | POA: Diagnosis not present

## 2016-10-19 DIAGNOSIS — N183 Chronic kidney disease, stage 3 (moderate): Secondary | ICD-10-CM | POA: Diagnosis not present

## 2016-10-26 DIAGNOSIS — Z940 Kidney transplant status: Secondary | ICD-10-CM | POA: Diagnosis not present

## 2016-11-03 ENCOUNTER — Telehealth: Payer: Self-pay | Admitting: Family Medicine

## 2016-11-03 NOTE — Telephone Encounter (Signed)
Patient wants to get a pneumonia vaccine on nurse schedule.  Can you please advise if he needs pneumonia vaccine and which one.  Can he be on nurse schedule for this?

## 2016-11-03 NOTE — Telephone Encounter (Signed)
I reviewed his chart. Reassure pt that he is up to date on all pneumonia vaccines.

## 2016-11-04 ENCOUNTER — Ambulatory Visit (INDEPENDENT_AMBULATORY_CARE_PROVIDER_SITE_OTHER): Payer: Medicare HMO

## 2016-11-04 DIAGNOSIS — Z23 Encounter for immunization: Secondary | ICD-10-CM

## 2016-11-04 NOTE — Telephone Encounter (Signed)
Patient aware.

## 2016-11-05 DIAGNOSIS — Z940 Kidney transplant status: Secondary | ICD-10-CM | POA: Diagnosis not present

## 2016-11-09 DIAGNOSIS — R739 Hyperglycemia, unspecified: Secondary | ICD-10-CM | POA: Diagnosis not present

## 2016-11-09 DIAGNOSIS — N186 End stage renal disease: Secondary | ICD-10-CM | POA: Diagnosis not present

## 2016-11-09 DIAGNOSIS — K824 Cholesterolosis of gallbladder: Secondary | ICD-10-CM | POA: Diagnosis not present

## 2016-11-09 DIAGNOSIS — Z940 Kidney transplant status: Secondary | ICD-10-CM | POA: Diagnosis not present

## 2016-11-09 DIAGNOSIS — M109 Gout, unspecified: Secondary | ICD-10-CM | POA: Diagnosis not present

## 2016-11-09 DIAGNOSIS — E669 Obesity, unspecified: Secondary | ICD-10-CM | POA: Diagnosis not present

## 2016-11-09 DIAGNOSIS — I129 Hypertensive chronic kidney disease with stage 1 through stage 4 chronic kidney disease, or unspecified chronic kidney disease: Secondary | ICD-10-CM | POA: Diagnosis not present

## 2016-11-27 DIAGNOSIS — I639 Cerebral infarction, unspecified: Secondary | ICD-10-CM

## 2016-11-27 HISTORY — DX: Cerebral infarction, unspecified: I63.9

## 2016-11-27 HISTORY — PX: TRANSTHORACIC ECHOCARDIOGRAM: SHX275

## 2016-11-30 ENCOUNTER — Inpatient Hospital Stay (HOSPITAL_COMMUNITY)
Admission: EM | Admit: 2016-11-30 | Discharge: 2016-12-05 | DRG: 062 | Disposition: A | Discharge status: Home Health | Payer: Medicare HMO | Attending: Neurology | Admitting: Neurology

## 2016-11-30 ENCOUNTER — Emergency Department (HOSPITAL_COMMUNITY): Payer: Medicare HMO

## 2016-11-30 ENCOUNTER — Encounter (HOSPITAL_COMMUNITY): Payer: Self-pay | Admitting: *Deleted

## 2016-11-30 DIAGNOSIS — N400 Enlarged prostate without lower urinary tract symptoms: Secondary | ICD-10-CM | POA: Diagnosis not present

## 2016-11-30 DIAGNOSIS — M109 Gout, unspecified: Secondary | ICD-10-CM | POA: Diagnosis present

## 2016-11-30 DIAGNOSIS — H532 Diplopia: Secondary | ICD-10-CM | POA: Diagnosis present

## 2016-11-30 DIAGNOSIS — N179 Acute kidney failure, unspecified: Secondary | ICD-10-CM | POA: Diagnosis not present

## 2016-11-30 DIAGNOSIS — I69398 Other sequelae of cerebral infarction: Secondary | ICD-10-CM | POA: Diagnosis not present

## 2016-11-30 DIAGNOSIS — I129 Hypertensive chronic kidney disease with stage 1 through stage 4 chronic kidney disease, or unspecified chronic kidney disease: Secondary | ICD-10-CM | POA: Diagnosis not present

## 2016-11-30 DIAGNOSIS — Z85828 Personal history of other malignant neoplasm of skin: Secondary | ICD-10-CM

## 2016-11-30 DIAGNOSIS — I639 Cerebral infarction, unspecified: Secondary | ICD-10-CM | POA: Diagnosis not present

## 2016-11-30 DIAGNOSIS — I10 Essential (primary) hypertension: Secondary | ICD-10-CM

## 2016-11-30 DIAGNOSIS — R69 Illness, unspecified: Secondary | ICD-10-CM | POA: Diagnosis not present

## 2016-11-30 DIAGNOSIS — Z940 Kidney transplant status: Secondary | ICD-10-CM

## 2016-11-30 DIAGNOSIS — R2681 Unsteadiness on feet: Secondary | ICD-10-CM

## 2016-11-30 DIAGNOSIS — M1990 Unspecified osteoarthritis, unspecified site: Secondary | ICD-10-CM | POA: Diagnosis present

## 2016-11-30 DIAGNOSIS — I6302 Cerebral infarction due to thrombosis of basilar artery: Secondary | ICD-10-CM | POA: Diagnosis not present

## 2016-11-30 DIAGNOSIS — N183 Chronic kidney disease, stage 3 (moderate): Secondary | ICD-10-CM | POA: Diagnosis not present

## 2016-11-30 DIAGNOSIS — F319 Bipolar disorder, unspecified: Secondary | ICD-10-CM | POA: Diagnosis present

## 2016-11-30 DIAGNOSIS — K219 Gastro-esophageal reflux disease without esophagitis: Secondary | ICD-10-CM | POA: Diagnosis present

## 2016-11-30 DIAGNOSIS — E039 Hypothyroidism, unspecified: Secondary | ICD-10-CM | POA: Diagnosis present

## 2016-11-30 DIAGNOSIS — G2581 Restless legs syndrome: Secondary | ICD-10-CM | POA: Diagnosis present

## 2016-11-30 DIAGNOSIS — R29810 Facial weakness: Secondary | ICD-10-CM | POA: Diagnosis present

## 2016-11-30 DIAGNOSIS — R278 Other lack of coordination: Secondary | ICD-10-CM | POA: Diagnosis present

## 2016-11-30 DIAGNOSIS — Z7982 Long term (current) use of aspirin: Secondary | ICD-10-CM

## 2016-11-30 DIAGNOSIS — Z87891 Personal history of nicotine dependence: Secondary | ICD-10-CM

## 2016-11-30 DIAGNOSIS — I69393 Ataxia following cerebral infarction: Secondary | ICD-10-CM | POA: Diagnosis not present

## 2016-11-30 DIAGNOSIS — R42 Dizziness and giddiness: Secondary | ICD-10-CM | POA: Diagnosis not present

## 2016-11-30 DIAGNOSIS — I6789 Other cerebrovascular disease: Secondary | ICD-10-CM | POA: Diagnosis not present

## 2016-11-30 DIAGNOSIS — E785 Hyperlipidemia, unspecified: Secondary | ICD-10-CM | POA: Diagnosis not present

## 2016-11-30 DIAGNOSIS — Z79899 Other long term (current) drug therapy: Secondary | ICD-10-CM | POA: Diagnosis not present

## 2016-11-30 DIAGNOSIS — H5500 Unspecified nystagmus: Secondary | ICD-10-CM | POA: Diagnosis present

## 2016-11-30 DIAGNOSIS — R404 Transient alteration of awareness: Secondary | ICD-10-CM | POA: Diagnosis not present

## 2016-11-30 DIAGNOSIS — R112 Nausea with vomiting, unspecified: Secondary | ICD-10-CM | POA: Diagnosis not present

## 2016-11-30 DIAGNOSIS — R269 Unspecified abnormalities of gait and mobility: Secondary | ICD-10-CM | POA: Diagnosis not present

## 2016-11-30 LAB — I-STAT CHEM 8, ED
BUN: 29 mg/dL — AB (ref 6–20)
CALCIUM ION: 1.14 mmol/L — AB (ref 1.15–1.40)
CHLORIDE: 107 mmol/L (ref 101–111)
Creatinine, Ser: 1.5 mg/dL — ABNORMAL HIGH (ref 0.61–1.24)
Glucose, Bld: 153 mg/dL — ABNORMAL HIGH (ref 65–99)
HCT: 41 % (ref 39.0–52.0)
Hemoglobin: 13.9 g/dL (ref 13.0–17.0)
POTASSIUM: 4.8 mmol/L (ref 3.5–5.1)
SODIUM: 138 mmol/L (ref 135–145)
TCO2: 23 mmol/L (ref 0–100)

## 2016-11-30 LAB — COMPREHENSIVE METABOLIC PANEL
ALK PHOS: 53 U/L (ref 38–126)
ALT: 20 U/L (ref 17–63)
ANION GAP: 8 (ref 5–15)
AST: 24 U/L (ref 15–41)
Albumin: 3.8 g/dL (ref 3.5–5.0)
BUN: 26 mg/dL — ABNORMAL HIGH (ref 6–20)
CALCIUM: 8.6 mg/dL — AB (ref 8.9–10.3)
CHLORIDE: 109 mmol/L (ref 101–111)
CO2: 21 mmol/L — AB (ref 22–32)
Creatinine, Ser: 1.44 mg/dL — ABNORMAL HIGH (ref 0.61–1.24)
GFR, EST AFRICAN AMERICAN: 53 mL/min — AB (ref >60)
GFR, EST NON AFRICAN AMERICAN: 45 mL/min — AB (ref >60)
Glucose, Bld: 154 mg/dL — ABNORMAL HIGH (ref 65–99)
Potassium: 4.3 mmol/L (ref 3.5–5.1)
SODIUM: 138 mmol/L (ref 135–145)
Total Bilirubin: 0.9 mg/dL (ref 0.3–1.2)
Total Protein: 6.4 g/dL — ABNORMAL LOW (ref 6.5–8.1)

## 2016-11-30 LAB — CBC
HCT: 42.5 % (ref 39.0–52.0)
HEMOGLOBIN: 13.8 g/dL (ref 13.0–17.0)
MCH: 28.2 pg (ref 26.0–34.0)
MCHC: 32.5 g/dL (ref 30.0–36.0)
MCV: 86.9 fL (ref 78.0–100.0)
PLATELETS: 216 10*3/uL (ref 150–400)
RBC: 4.89 MIL/uL (ref 4.22–5.81)
RDW: 13.9 % (ref 11.5–15.5)
WBC: 7.5 10*3/uL (ref 4.0–10.5)

## 2016-11-30 LAB — DIFFERENTIAL
BASOS ABS: 0 10*3/uL (ref 0.0–0.1)
BASOS PCT: 0 %
EOS ABS: 0.2 10*3/uL (ref 0.0–0.7)
EOS PCT: 3 %
Lymphocytes Relative: 10 %
Lymphs Abs: 0.7 10*3/uL (ref 0.7–4.0)
Monocytes Absolute: 0.6 10*3/uL (ref 0.1–1.0)
Monocytes Relative: 9 %
NEUTROS PCT: 78 %
Neutro Abs: 5.7 10*3/uL (ref 1.7–7.7)

## 2016-11-30 LAB — LIPASE, BLOOD: LIPASE: 22 U/L (ref 11–51)

## 2016-11-30 LAB — APTT: APTT: 25 s (ref 24–36)

## 2016-11-30 LAB — PROTIME-INR
INR: 1.06
PROTHROMBIN TIME: 13.9 s (ref 11.4–15.2)

## 2016-11-30 LAB — I-STAT TROPONIN, ED: TROPONIN I, POC: 0.01 ng/mL (ref 0.00–0.08)

## 2016-11-30 LAB — ETHANOL

## 2016-11-30 LAB — CBG MONITORING, ED: GLUCOSE-CAPILLARY: 155 mg/dL — AB (ref 65–99)

## 2016-11-30 MED ORDER — SODIUM CHLORIDE 0.9 % IV SOLN: 50.0000 mL | Freq: Once | INTRAVENOUS | Status: DC

## 2016-11-30 MED ORDER — SODIUM CHLORIDE 0.9 % IV SOLN
INTRAVENOUS | Status: DC
  Administered 2016-11-30 – 2016-12-04 (×3): via INTRAVENOUS

## 2016-11-30 MED ORDER — ALTEPLASE (STROKE) FULL DOSE INFUSION
0.9000 mg/kg | Freq: Once | INTRAVENOUS | Status: AC
  Administered 2016-11-30: 84 mg via INTRAVENOUS
  Filled 2016-11-30: qty 100

## 2016-11-30 MED ORDER — DIAZEPAM 5 MG/ML IJ SOLN
2.0000 mg | Freq: Once | INTRAMUSCULAR | Status: AC
  Administered 2016-11-30: 2 mg via INTRAVENOUS
  Filled 2016-11-30: qty 2

## 2016-11-30 MED ORDER — VITAMIN D 1000 UNITS PO TABS
2000.0000 [IU] | ORAL_TABLET | Freq: Every day | ORAL | Status: DC
  Administered 2016-12-01 – 2016-12-05 (×5): 2000 [IU] via ORAL
  Filled 2016-11-30 (×5): qty 2

## 2016-11-30 MED ORDER — CARVEDILOL 6.25 MG PO TABS
6.2500 mg | ORAL_TABLET | Freq: Every day | ORAL | Status: DC
  Administered 2016-12-01 – 2016-12-04 (×4): 6.25 mg via ORAL
  Filled 2016-11-30 (×2): qty 1
  Filled 2016-11-30: qty 2
  Filled 2016-11-30: qty 1

## 2016-11-30 MED ORDER — LEVOTHYROXINE SODIUM 25 MCG PO TABS: 25.0000 ug | ORAL_TABLET | Freq: Every day | ORAL | Status: DC

## 2016-11-30 MED ORDER — CARVEDILOL 3.125 MG PO TABS: 6.2500 mg | ORAL_TABLET | Freq: Two times a day (BID) | ORAL | Status: DC

## 2016-11-30 MED ORDER — TACROLIMUS 0.5 MG PO CAPS
0.5000 mg | ORAL_CAPSULE | Freq: Every day | ORAL | Status: DC
  Filled 2016-11-30: qty 1

## 2016-11-30 MED ORDER — ACETAMINOPHEN 650 MG RE SUPP: 650.0000 mg | RECTAL | Status: DC | PRN

## 2016-11-30 MED ORDER — TACROLIMUS 1 MG PO CAPS
1.0000 mg | ORAL_CAPSULE | Freq: Every day | ORAL | Status: DC
  Administered 2016-12-01 – 2016-12-05 (×5): 1 mg via ORAL
  Filled 2016-11-30 (×5): qty 1

## 2016-11-30 MED ORDER — TACROLIMUS 1 MG PO CAPS: 1.0000 mg | ORAL_CAPSULE | Freq: Every day | ORAL | Status: DC

## 2016-11-30 MED ORDER — RISPERIDONE 0.5 MG PO TABS
1.0000 mg | ORAL_TABLET | Freq: Every day | ORAL | Status: DC
  Administered 2016-12-01 – 2016-12-04 (×4): 1 mg via ORAL
  Filled 2016-11-30 (×3): qty 2
  Filled 2016-11-30 (×2): qty 1

## 2016-11-30 MED ORDER — ALTEPLASE (STROKE) FULL DOSE INFUSION: 0.9000 mg/kg | Freq: Once | INTRAVENOUS | Status: DC

## 2016-11-30 MED ORDER — DIAZEPAM 5 MG/ML IJ SOLN
2.5000 mg | Freq: Once | INTRAMUSCULAR | Status: AC
  Administered 2016-11-30: 2.5 mg via INTRAVENOUS
  Filled 2016-11-30: qty 2

## 2016-11-30 MED ORDER — LEVOTHYROXINE SODIUM 25 MCG PO TABS
25.0000 ug | ORAL_TABLET | Freq: Every day | ORAL | Status: DC
  Administered 2016-12-01 – 2016-12-05 (×5): 25 ug via ORAL
  Filled 2016-11-30 (×5): qty 1

## 2016-11-30 MED ORDER — STROKE: EARLY STAGES OF RECOVERY BOOK
Freq: Once | Status: AC
  Administered 2016-11-30: 20:00:00
  Filled 2016-11-30: qty 1

## 2016-11-30 MED ORDER — SULFAMETHOXAZOLE-TRIMETHOPRIM 400-80 MG PO TABS
1.0000 | ORAL_TABLET | ORAL | Status: DC
  Administered 2016-12-02 – 2016-12-04 (×2): 1 via ORAL
  Filled 2016-11-30 (×2): qty 1

## 2016-11-30 MED ORDER — TACROLIMUS 1 MG PO CAPS
1.0000 mg | ORAL_CAPSULE | Freq: Two times a day (BID) | ORAL | Status: DC
  Filled 2016-11-30: qty 1

## 2016-11-30 MED ORDER — OMEGA-3 1000 MG PO CAPS: 1000.0000 mg | ORAL_CAPSULE | Freq: Two times a day (BID) | ORAL | Status: DC

## 2016-11-30 MED ORDER — ONDANSETRON HCL 4 MG/2ML IJ SOLN
4.0000 mg | Freq: Once | INTRAMUSCULAR | Status: AC
  Administered 2016-11-30: 4 mg via INTRAVENOUS
  Filled 2016-11-30: qty 2

## 2016-11-30 MED ORDER — LABETALOL HCL 5 MG/ML IV SOLN: 10.0000 mg | INTRAVENOUS | Status: DC | PRN

## 2016-11-30 MED ORDER — PANTOPRAZOLE SODIUM 40 MG IV SOLR
40.0000 mg | Freq: Every day | INTRAVENOUS | Status: DC
  Filled 2016-11-30: qty 40

## 2016-11-30 MED ORDER — METOCLOPRAMIDE HCL 5 MG/ML IJ SOLN
10.0000 mg | Freq: Once | INTRAMUSCULAR | Status: AC
  Administered 2016-11-30: 10 mg via INTRAVENOUS
  Filled 2016-11-30 (×2): qty 2

## 2016-11-30 MED ORDER — HYDROXYZINE HCL 25 MG PO TABS
25.0000 mg | ORAL_TABLET | Freq: Three times a day (TID) | ORAL | Status: DC
  Administered 2016-12-01 – 2016-12-05 (×13): 25 mg via ORAL
  Filled 2016-11-30 (×13): qty 1

## 2016-11-30 MED ORDER — ONDANSETRON HCL 4 MG/2ML IJ SOLN
4.0000 mg | INTRAMUSCULAR | Status: DC | PRN
  Administered 2016-12-01: 4 mg via INTRAVENOUS
  Filled 2016-11-30: qty 2

## 2016-11-30 MED ORDER — SIMVASTATIN 5 MG PO TABS
10.0000 mg | ORAL_TABLET | Freq: Every day | ORAL | Status: DC
  Administered 2016-12-01 – 2016-12-04 (×4): 10 mg via ORAL
  Filled 2016-11-30 (×3): qty 2
  Filled 2016-11-30: qty 1

## 2016-11-30 MED ORDER — PROMETHAZINE HCL 25 MG/ML IJ SOLN
12.5000 mg | Freq: Once | INTRAMUSCULAR | Status: AC
  Administered 2016-11-30: 12.5 mg via INTRAVENOUS
  Filled 2016-11-30: qty 1

## 2016-11-30 MED ORDER — TACROLIMUS 0.5 MG PO CAPS: 0.5000 mg | ORAL_CAPSULE | ORAL | Status: DC

## 2016-11-30 MED ORDER — ACETAMINOPHEN 325 MG PO TABS: 650.0000 mg | ORAL_TABLET | ORAL | Status: DC | PRN

## 2016-11-30 MED ORDER — VITAMIN B-12 100 MCG PO TABS
100.0000 ug | ORAL_TABLET | Freq: Every day | ORAL | Status: DC
  Administered 2016-12-01 – 2016-12-05 (×5): 100 ug via ORAL
  Filled 2016-11-30 (×5): qty 1

## 2016-11-30 MED ORDER — ALLOPURINOL 100 MG PO TABS
300.0000 mg | ORAL_TABLET | Freq: Every day | ORAL | Status: DC
  Administered 2016-12-01 – 2016-12-05 (×5): 300 mg via ORAL
  Filled 2016-11-30 (×3): qty 3
  Filled 2016-11-30: qty 1
  Filled 2016-11-30: qty 3

## 2016-11-30 MED ORDER — VITAMIN B-12 100 MCG PO TABS
100.0000 ug | ORAL_TABLET | Freq: Every day | ORAL | Status: DC
  Filled 2016-11-30: qty 1

## 2016-11-30 MED ORDER — CARVEDILOL 12.5 MG PO TABS
12.5000 mg | ORAL_TABLET | Freq: Every day | ORAL | Status: DC
  Administered 2016-12-01 – 2016-12-05 (×5): 12.5 mg via ORAL
  Filled 2016-11-30 (×5): qty 1

## 2016-11-30 MED ORDER — TACROLIMUS 0.5 MG PO CAPS
0.5000 mg | ORAL_CAPSULE | Freq: Every day | ORAL | Status: DC
  Administered 2016-12-01 – 2016-12-04 (×4): 0.5 mg via ORAL
  Filled 2016-11-30 (×5): qty 1

## 2016-11-30 MED ORDER — RISPERIDONE 1 MG PO TABS: 1.0000 mg | ORAL_TABLET | Freq: Every day | ORAL | Status: DC

## 2016-11-30 MED ORDER — MYCOPHENOLATE SODIUM 180 MG PO TBEC
360.0000 mg | DELAYED_RELEASE_TABLET | Freq: Two times a day (BID) | ORAL | Status: DC
  Administered 2016-12-01 – 2016-12-05 (×9): 360 mg via ORAL
  Filled 2016-11-30 (×10): qty 2

## 2016-11-30 MED ORDER — OMEGA-3-ACID ETHYL ESTERS 1 G PO CAPS
1.0000 g | ORAL_CAPSULE | Freq: Two times a day (BID) | ORAL | Status: DC
  Administered 2016-12-01 – 2016-12-05 (×9): 1 g via ORAL
  Filled 2016-11-30 (×9): qty 1

## 2016-11-30 MED ORDER — SENNOSIDES-DOCUSATE SODIUM 8.6-50 MG PO TABS
1.0000 | ORAL_TABLET | Freq: Every evening | ORAL | Status: DC | PRN
  Administered 2016-12-04: 1 via ORAL
  Filled 2016-11-30 (×2): qty 1

## 2016-11-30 MED ORDER — PANTOPRAZOLE SODIUM 40 MG PO TBEC
40.0000 mg | DELAYED_RELEASE_TABLET | Freq: Every day | ORAL | Status: DC
  Administered 2016-12-01 – 2016-12-05 (×5): 40 mg via ORAL
  Filled 2016-11-30 (×5): qty 1

## 2016-11-30 NOTE — Code Documentation (Signed)
80yo male arriving to [facility] via [place] at 82.  Stroke team to the bedside.  Patient initially presented to [facility] with c/o sudden onset dizziness at 1230.  Per patient's wife nausea began about 30 minutes later.  Patient with several episodes of vomiting at [facility].  Code stroke activated at [facility].  CT head and labs completed at [facility].  Patient transferred to [facility].  NIHSS 2, see documentation for details and code stroke times.  Patient with mild right facial droop, left beating nystagmus and LUE ataxia on exam.  Dr. Erlinda Hong at the bedside and decision made to treat with tPA.  Pharmacist at the bedside to mix tPA. BP within pre-tPA parameters.  8mg  tPA bolus given over 1 minutes at 1521 followed by 76mg /hr for a total of 84mg  per pharmacy dosing.  Patient monitored frequently per post-tPA protocol.  NS IVF started per MD.  Patient with dry heaving and emesis x 1.  Dr. Erlinda Hong at the bedside.  Order for Phenergan IVP per MD, given by ED RN.  Patient transferred to [id number] with ED RN and Stroke RN.  tPA infusion completed, NS flush hung.  Bedside handoff with 34M RN Margaretha Sheffield.

## 2016-11-30 NOTE — ED Triage Notes (Addendum)
Per EMS, pt complains of dizziness, emesis, blurred vision since 1230P today. Pt given 4mg  zofran en route to hospital, is still vomiting upon arrival to ED. Pt states blurred vision is resolving but still feels dizzy. Pt has hx of kidney transplant 4 years ago. Pt's wife states pt's BP medication has been adjusted recently   Pt denies abdominal pain, diarrhea.

## 2016-11-30 NOTE — ED Notes (Signed)
Pt dry heaving, Dr. Erlinda Hong aware.

## 2016-11-30 NOTE — ED Notes (Signed)
Dr. Erlinda Hong at bedside - pt has vomited.

## 2016-11-30 NOTE — ED Notes (Signed)
CODE STROKE CALLED

## 2016-11-30 NOTE — ED Notes (Signed)
Pt to CT. Will leave by carelink after CT per MD.

## 2016-11-30 NOTE — ED Notes (Signed)
Dr. Rosalin Hawking, Marva Panda. Rapid Response RN both at bedside

## 2016-11-30 NOTE — H&P (Signed)
STROKE ADMISSION NOTE    Chief Complaint: vertigo and blurry vision  HPI: Curtis Jimenez is an 80 y.o. male with hx of HTN, CKD, renal transplant 4 years ago at [facility] presented as code stroke due to vertigo and blurry vision. He was sitting in chair at 12:30pm when he stood up, suddenly he felt room spinning and upside down. He had to sit down but then developed nausea and vomiting with blurry vision and double vision. He denies any weakness or numbness, LOC or swallowing difficulty. EMS called, he was given zofran en route to [facility] ED. Had CT head showed no acute abnormalities. Transfer to [facility] for code stroke. On arrival, pt still nausea and later vomited. Exam showed left nystagmus, left arm mild ataxia and right mild facial droop. Concerning for small brainstem infarct. tPA given. Pt was admitted to ICU after tPA.   LSN: 12:30pm tPA Given: Yes  Past Medical History:  Diagnosis Date  . Anemia   . Arthritis   . Basal cell carcinoma    neck (skin MD 2X per year)  . Bipolar disorder ([place])   . BPH (benign prostatic hyperplasia)    with elevated PSA; followed by Dr. Rosana Hoes at [facility]  . Cataract   . Chronic renal insufficiency, stage III (moderate)    Last f/u with neph 07/06/16: Cr stable at 1.57 (GFR 42)  . Gallbladder polyp 2015   Asymptomatic  . GERD (gastroesophageal reflux disease)   . Gout   . Hyperlipidemia   . Hypertension   . Hypothyroidism   . Restless legs syndrome   . 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 AV FISTULA PLACEMENT  04/08/2012   Procedure: ARTERIOVENOUS (AV) FISTULA CREATION;  Surgeon: Angelia Mould, MD;  Location: [facility] OR;  Service: Vascular;  Laterality: Left;  Creation of left brachial cephalic arteriovenous fistula  . COLONOSCOPY  2013   Normal except diverticulosis (recall 10 yrs)  . dilation  for GERD    . KIDNEY TRANSPLANT  11/06/12   Delta (cadaveric)  . PROSTATE BIOPSY  2011   no malignancy    Family History  Problem Relation Age of Onset  . Cancer Father     BLADDER  . Heart disease Father    Social History:  reports that he quit smoking about 40 years ago. His smoking use included Pipe and Cigars. He quit after 16.00 years of use. He has never used smokeless tobacco. He reports that he does not drink alcohol or use drugs.  Allergies:  Allergies  Allergen Reactions  . Amantadine Hcl     REACTION: itching  . Chlorpromazine Hcl Other (See Comments)    REACTION: "fell flat on face"    Medications: I have reviewed the patient's current medications.  Current Meds  Medication Sig  . acetaminophen (TYLENOL) 325 MG tablet Take 650 mg by mouth every 6 (six) hours as needed for mild pain.  Marland Kitchen allopurinol (ZYLOPRIM) 300 MG tablet Take 1 tablet (300 mg total) by mouth daily.  Marland Kitchen amLODipine (NORVASC) 5 MG tablet Take 5 mg by mouth daily.   Marland Kitchen aspirin 81 MG chewable tablet Chew 81 mg by mouth daily.   . carvedilol (COREG) 6.25 MG tablet Take 1 tablet (6.25 mg total) by mouth 2 (two) times daily with a meal. (Patient taking differently: Take 6.25-12.5 mg by mouth  See admin instructions. 12.64m in am, 6.224min pm)  . Cholecalciferol (VITAMIN D) 2000 UNITS CAPS Take 1 capsule by mouth daily.  . colchicine 0.6 MG tablet 2 tabs at onset of gout flare, then 1 tab one hour later, then starting the next day take 1 tab bid until gout flare is resolved  . hydrOXYzine (ATARAX/VISTARIL) 25 MG tablet Take 25 mg by mouth 3 (three) times daily.   . Marland Kitchenevothyroxine (SYNTHROID, LEVOTHROID) 25 MCG tablet Take 1 tablet (25 mcg total) by mouth daily.  . mycophenolate (MYFORTIC) 180 MG EC tablet Take 360 mg by mouth 2 (two) times daily.   . Omega-3 1000 MG CAPS Take 1,000 mg by mouth 2 (two) times daily. Take 2 g by mouth 2 (two) times daily as needed.  . Marland Kitchenmeprazole (PRILOSEC) 20 MG capsule Take 1  capsule (20 mg total) by mouth daily.  . risperiDONE (RISPERDAL) 2 MG tablet Take 1 mg by mouth daily. Take 1/2 tablet daily.   . simvastatin (ZOCOR) 40 MG tablet 1/2 tab po qd (Patient taking differently: Take 10 mg by mouth daily. )  . sulfamethoxazole-trimethoprim (BACTRIM,SEPTRA) 400-80 MG tablet Take 1 tablet by mouth every Monday, Wednesday, and Friday.  . tacrolimus (PROGRAF) 1 MG capsule Take 0.5-1 mg by mouth See admin instructions. 1 mg in am, 0.27m103mn pm  . vitamin B-12 (CYANOCOBALAMIN) 100 MCG tablet Take 100 mcg by mouth daily.    ROS: Review of Systems: ROS was attempted today and was able to be performed.  Systems assessed include - Constitutional, Eyes, HENT, Respiratory, Cardiovascular, Gastrointestinal, Genitourinary, Integument/breast, Hematologic/lymphatic, Musculoskeletal, Neurological, Behavioral/Psych, Endocrine, Allergic/Immunologic - the patient complains of only the following symptoms, and all other reviewed systems are negative.   Physical Examination:  Temp:  [97.8 F (36.6 C)] 97.8 F (36.6 C) (12/04 1520) Pulse Rate:  [78-93] 84 (12/04 1700) Resp:  [14-19] 16 (12/04 1700) BP: (108-149)/(69-95) 117/70 (12/04 1700) SpO2:  [92 %-95 %] 94 % (12/04 1700)  General - Well nourished, well developed, in acute distress with N/V.  Ophthalmologic - Fundi not visualized due to acute distress.  Cardiovascular - Regular rate and rhythm.  Mental Status -  Level of arousal and orientation to time, place, and person were intact. Language including expression, naming, repetition, comprehension was assessed and found intact.  Cranial Nerves II - XII - II - Visual field intact OU. III, IV, VI - Extraocular movements intact. V - Facial sensation intact bilaterally. VII - right nasolabial fold mild flattening. VIII - Hearing & vestibular exam showed left sided persistent nystagmus on left gaze. X - Palate elevates symmetrically. XI - Chin turning & shoulder shrug intact  bilaterally. XII - Tongue protrusion to the left.  Motor Strength - [name] strength was normal in all extremities and pronator drift was absent.  Bulk was normal and fasciculations were absent.   Motor Tone - Muscle tone was assessed at the neck and appendages and was normal.  Reflexes - [name] reflexes were 1+ in all extremities and he had no pathological reflexes.  Sensory - Light touch, temperature/pinprick were assessed and were symmetrical.    Coordination - The patient had mild dysmetria on the left FTN and rapid alternating test.    Gait and Station - not tested due to N/V  NIH Stroke Scale  Level Of Consciousness 0=Alert; keenly responsive 1=Not alert, but arousable by minor stimulation 2=Not alert, requires repeated stimulation 3=Responds only with reflex movements 0  LOC Questions to Month and  Age 61=Answers both questions correctly 1=Answers one question correctly 2=Answers neither question correctly 0  LOC Commands      -Open/Close eyes     -Open/close grip 0=Performs both tasks correctly 1=Performs one task correctly 2=Performs neighter task correctly 0  Best Gaze 0=Normal 1=Partial gaze palsy 2=Forced deviation, or total gaze paresis 0  Visual 0=No visual loss 1=Partial hemianopia 2=Complete hemianopia 3=Bilateral hemianopia (blind including cortical blindness) 0  Facial Palsy 0=Normal symmetrical movement 1=Minor paralysis (asymmetry) 2=Partial paralysis (lower face) 3=Complete paralysis (upper and lower face) 1  Motor  0=No drift, limb holds posture for full 10 seconds 1=Drift, limb holds posture, no drift to bed 2=Some antigravity effort, cannot maintain posture, drifts to bed 3=No effort against gravity, limb falls 4=No movement Right Arm 0     Leg 0    Left Arm 0     Leg 0  Limb Ataxia 0=Absent 1=Present in one limb 2=Present in two limbs 1  Sensory 0=Normal 1=Mild to moderate sensory loss 2=Severe to total sensory loss 0  Best  Language 0=No aphasia, normal 1=Mild to moderate aphasia 2=Mute, global aphasia 3=Mute, global aphasia 0  Dysarthria 0=Normal 1=Mild to moderate 2=Severe, unintelligible or mute/anarthric 0  Extinction/Neglect 0=No abnormality 1=Extinction to bilateral simultaneous stimulation 2=Profound neglect 0  Total   2      Results for orders placed or performed during the hospital encounter of 11/30/16 (from the past 48 hour(s))  Ethanol     Status: None   Collection Time: 11/30/16 12:53 PM  Result Value Ref Range   Alcohol, Ethyl (B) <5 <5 mg/dL    Comment:        LOWEST DETECTABLE LIMIT FOR SERUM ALCOHOL IS 5 mg/dL FOR MEDICAL PURPOSES ONLY   CBG monitoring, ED     Status: Abnormal   Collection Time: 11/30/16  1:15 PM  Result Value Ref Range   Glucose-Capillary 155 (H) 65 - 99 mg/dL  Lipase, blood     Status: None   Collection Time: 11/30/16  1:34 PM  Result Value Ref Range   Lipase 22 11 - 51 U/L  Comprehensive metabolic panel     Status: Abnormal   Collection Time: 11/30/16  1:34 PM  Result Value Ref Range   Sodium 138 135 - 145 mmol/L   Potassium 4.3 3.5 - 5.1 mmol/L   Chloride 109 101 - 111 mmol/L   CO2 21 (L) 22 - 32 mmol/L   Glucose, Bld 154 (H) 65 - 99 mg/dL   BUN 26 (H) 6 - 20 mg/dL   Creatinine, Ser 1.44 (H) 0.61 - 1.24 mg/dL   Calcium 8.6 (L) 8.9 - 10.3 mg/dL   Total Protein 6.4 (L) 6.5 - 8.1 g/dL   Albumin 3.8 3.5 - 5.0 g/dL   AST 24 15 - 41 U/L   ALT 20 17 - 63 U/L   Alkaline Phosphatase 53 38 - 126 U/L   Total Bilirubin 0.9 0.3 - 1.2 mg/dL   GFR calc non Af Amer 45 (L) >60 mL/min   GFR calc Af Amer 53 (L) >60 mL/min    Comment: (NOTE) The eGFR has been calculated using the CKD EPI equation. This calculation has not been validated in all clinical situations. eGFR's persistently <60 mL/min signify possible Chronic Kidney Disease.    Anion gap 8 5 - 15  CBC     Status: None   Collection Time: 11/30/16  1:34 PM  Result Value Ref Range   WBC 7.5 4.0 -  10.5  K/uL   RBC 4.89 4.22 - 5.81 MIL/uL   Hemoglobin 13.8 13.0 - 17.0 g/dL   HCT 42.5 39.0 - 52.0 %   MCV 86.9 78.0 - 100.0 fL   MCH 28.2 26.0 - 34.0 pg   MCHC 32.5 30.0 - 36.0 g/dL   RDW 13.9 11.5 - 15.5 %   Platelets 216 150 - 400 K/uL  Protime-INR     Status: None   Collection Time: 11/30/16  1:34 PM  Result Value Ref Range   Prothrombin Time 13.9 11.4 - 15.2 seconds   INR 1.06   APTT     Status: None   Collection Time: 11/30/16  1:34 PM  Result Value Ref Range   aPTT 25 24 - 36 seconds  Differential     Status: None   Collection Time: 11/30/16  1:34 PM  Result Value Ref Range   Neutrophils Relative % 78 %   Neutro Abs 5.7 1.7 - 7.7 K/uL   Lymphocytes Relative 10 %   Lymphs Abs 0.7 0.7 - 4.0 K/uL   Monocytes Relative 9 %   Monocytes Absolute 0.6 0.1 - 1.0 K/uL   Eosinophils Relative 3 %   Eosinophils Absolute 0.2 0.0 - 0.7 K/uL   Basophils Relative 0 %   Basophils Absolute 0.0 0.0 - 0.1 K/uL  I-stat troponin, ED (not at [facility], [facility])     Status: None   Collection Time: 11/30/16  2:30 PM  Result Value Ref Range   Troponin i, poc 0.01 0.00 - 0.08 ng/mL   Comment 3            Comment: Due to the release kinetics of cTnI, a negative result within the first hours of the onset of symptoms does not rule out myocardial infarction with certainty. If myocardial infarction is still suspected, repeat the test at appropriate intervals.   I-Stat Chem 8, ED  (not at [facility], [facility])     Status: Abnormal   Collection Time: 11/30/16  2:32 PM  Result Value Ref Range   Sodium 138 135 - 145 mmol/L   Potassium 4.8 3.5 - 5.1 mmol/L   Chloride 107 101 - 111 mmol/L   BUN 29 (H) 6 - 20 mg/dL   Creatinine, Ser 1.50 (H) 0.61 - 1.24 mg/dL   Glucose, Bld 153 (H) 65 - 99 mg/dL   Calcium, Ion 1.14 (L) 1.15 - 1.40 mmol/L   TCO2 23 0 - 100 mmol/L   Hemoglobin 13.9 13.0 - 17.0 g/dL   HCT 41.0 39.0 - 52.0 %    I have personally reviewed the radiological images below and agree with the radiology  interpretations.  Ct Head Code Stroke W/o Cm 11/30/2016 IMPRESSION: 1. Mild atrophy and white matter disease likely reflects the sequela of chronic microvascular ischemia. 2. No acute intracranial abnormality. 3. ASPECTS is 10/10.    Assessment: 80 y.o. male with hx of HTN, CKD, renal transplant 4 years ago at [facility] presented as code stroke due to vertigo and blurry vision. On arrival, pt still nausea and later vomited. Exam showed left nystagmus, left arm mild ataxia and right mild facial droop. Had CT head showed no acute abnormalities. Concerning for small brainstem infarct. tPA given. Pt was admitted to ICU after tPA. Cre 1.50 post renal transplant. Will not do CTA head or neck. Will consider MRI MRA and CUS for further work up.   Stroke Risk Factors - hypertension and advanced age  Plan: 1. HgbA1c, fasting lipid panel 2. MRI, MRA  of the brain without contrast 3. PT consult, OT consult, Speech consult 4. Echocardiogram 5. Carotid dopplers 6. Prophylactic therapy - post tPA 7. Risk factor modification 8. Telemetry monitoring 9. Frequent neuro checks    This patient is critically ill due to code stroke and s/p tPA and at significant risk of neurological worsening, death form recurrent stroke, hemorrhagic transformation, bleeding. This patient's care requires constant monitoring of vital signs, hemodynamics, respiratory and cardiac monitoring, review of multiple databases, neurological assessment, discussion with family, other specialists and medical decision making of high complexity. I spent 40 minutes of neurocritical care time in the care of this patient.  Rosalin Hawking, MD PhD Stroke Neurology 11/30/2016 6:54 PM

## 2016-11-30 NOTE — ED Notes (Signed)
Per Dr. Erlinda Hong, do not complete a swallow screen until the pt has received the entire dose of TPA,

## 2016-11-30 NOTE — ED Provider Notes (Signed)
[facility] DEPT Provider Note   CSN: [id number] Arrival date & time: 11/30/16  1253     History   Chief Complaint Chief Complaint  Patient presents with  . Dizziness  . Emesis    HPI Curtis Jimenez is a 80 y.o. male.  HPI 80 year old male with past medical history as below who presents with acute onset of vertigo. Patient was in his usual state of health until approximately 12:30 PM. At 12:30 PM, the patient stood up and experienced room spinning sensation. He states that he did not know which way was upper down. He then began vomiting and reports double vision. He states that he intermittently seems to have things and is having difficulty focusing. Denies any associated difficulty speaking, swallowing, numbness, or weakness. He was able to walk, although he felt unsteady due to feeling as though he was walking sideways. He has no history of vertigo but does endorse a history of chronic tinnitus in his bilateral ears. Of note, he is status post renal transplant for lithium toxicity.  Past Medical History:  Diagnosis Date  . Anemia   . Arthritis   . Basal cell carcinoma    neck (skin MD 2X per year)  . Bipolar disorder ([place])   . BPH (benign prostatic hyperplasia)    with elevated PSA; followed by Dr. Rosana Hoes at [facility]  . Cataract   . Chronic renal insufficiency, stage III (moderate)    Last f/u with neph 07/06/16: Cr stable at 1.57 (GFR 42)  . Gallbladder polyp 2015   Asymptomatic  . GERD (gastroesophageal reflux disease)   . Gout   . Hyperlipidemia   . Hypertension   . Hypothyroidism   . Restless legs syndrome   . S/p cadaver renal transplant 2013   Secondary to HTN +lithium toxicity over 30 yrs caused kidney destruction [facility] transplant MDs)    Patient Active Problem List   Diagnosis Date Noted  . Stroke ([place]) 11/30/2016  . Essential hypertension 01/16/2015  . S/p cadaver renal transplant 11/06/2012  . Chronic kidney disease (CKD), stage IV (severe)  ([place]) 03/18/2012  . BENIGN PROSTATIC HYPERTROPHY 04/18/2008  . EXTRINSIC ASTHMA, UNSPECIFIED 12/19/2007  . PSA, INCREASED 12/09/2007  . Hypothyroidism 08/12/2007  . Bipolar disorder ([place]) 07/27/2007  . Hyperlipidemia 07/07/2007  . Gout 07/07/2007  . HYPERTENSION 07/07/2007  . GERD 07/07/2007    Past Surgical History:  Procedure Laterality Date  . AV FISTULA PLACEMENT  04/08/2012   Procedure: ARTERIOVENOUS (AV) FISTULA CREATION;  Surgeon: Angelia Mould, MD;  Location: [facility] OR;  Service: Vascular;  Laterality: Left;  Creation of left brachial cephalic arteriovenous fistula  . COLONOSCOPY  2013   Normal except diverticulosis (recall 10 yrs)  . dilation for GERD    . KIDNEY TRANSPLANT  11/06/12   [place] (cadaveric)  . PROSTATE BIOPSY  2011   no malignancy       Home Medications    Prior to Admission medications   Medication Sig Start Date End Date Taking? Authorizing Provider  allopurinol (ZYLOPRIM) 300 MG tablet Take 1 tablet (300 mg total) by mouth daily. 10/07/16   Tammi Sou, MD  amLODipine (NORVASC) 5 MG tablet  07/23/16   Historical Provider, MD  aspirin 81 MG chewable tablet Chew 81 mg by mouth.    Historical Provider, MD  carvedilol (COREG) 6.25 MG tablet Take 1 tablet (6.25 mg total) by mouth 2 (two) times daily with a meal. 10/07/16   Tammi Sou, MD  Cholecalciferol (VITAMIN  D) 2000 UNITS CAPS Take 1 capsule by mouth daily.    Historical Provider, MD  colchicine 0.6 MG tablet 2 tabs at onset of gout flare, then 1 tab one hour later, then starting the next day take 1 tab bid until gout flare is resolved 04/01/16   Tammi Sou, MD  hydrOXYzine (ATARAX/VISTARIL) 25 MG tablet Take 20 mg by mouth 3 (three) times daily.     Historical Provider, MD  levothyroxine (SYNTHROID, LEVOTHROID) 25 MCG tablet Take 1 tablet (25 mcg total) by mouth daily. 01/31/16   Tammi Sou, MD  mycophenolate (MYFORTIC) 180 MG EC tablet Take 720 mg by mouth daily.     Historical  Provider, MD  Omega-3 1000 MG CAPS Take 2 g by mouth 2 (two) times daily as needed. 12/06/12   Historical Provider, MD  omeprazole (PRILOSEC) 20 MG capsule Take 1 capsule (20 mg total) by mouth daily. 07/23/16   Tammi Sou, MD  risperiDONE (RISPERDAL) 2 MG tablet Take 1/2 tablet daily.    Historical Provider, MD  simvastatin (ZOCOR) 40 MG tablet 1/2 tab po qd 01/31/16   Tammi Sou, MD  tacrolimus (PROGRAF) 1 MG capsule Take 1 1/2 in the AM and 1 in the PM    Historical Provider, MD  thiamine (VITAMIN B-1) 100 MG tablet Take 100 mg by mouth.    Historical Provider, MD    Family History Family History  Problem Relation Age of Onset  . Cancer Father     BLADDER  . Heart disease Father     Social History Social History  Substance Use Topics  . Smoking status: Former Smoker    Years: 16.00    Types: Pipe, Cigars    Quit date: 05/11/1976  . Smokeless tobacco: Never Used  . Alcohol use No     Allergies   Amantadine hcl and Chlorpromazine hcl   Review of Systems Review of Systems  Constitutional: Positive for fatigue. Negative for chills and fever.  HENT: Negative for congestion and rhinorrhea.   Eyes: Positive for visual disturbance.  Respiratory: Negative for cough, shortness of breath and wheezing.   Cardiovascular: Negative for chest pain and leg swelling.  Gastrointestinal: Positive for vomiting. Negative for abdominal pain, diarrhea and nausea.  Genitourinary: Negative for dysuria and flank pain.  Musculoskeletal: Negative for neck pain and neck stiffness.  Skin: Negative for rash and wound.  Allergic/Immunologic: Negative for immunocompromised state.  Neurological: Positive for dizziness, weakness and light-headedness. Negative for syncope and headaches.  All other systems reviewed and are negative.    Physical Exam Updated Vital Signs BP 113/72   Pulse 88   Temp 97.8 F (36.6 C) (Oral)   Resp 14   SpO2 93%   Physical Exam  Constitutional: He is  oriented to person, place, and time. He appears well-developed and well-nourished. He appears distressed.  HENT:  Head: Normocephalic and atraumatic.  Eyes: Conjunctivae are normal.  Neck: Neck supple.  Cardiovascular: Normal rate, regular rhythm and normal heart sounds.  Exam reveals no friction rub.   No murmur heard. Pulmonary/Chest: Effort normal and breath sounds normal. No respiratory distress. He has no wheezes. He has no rales.  Abdominal: He exhibits no distension.  Musculoskeletal: He exhibits no edema.  Neurological: He is alert and oriented to person, place, and time. He exhibits normal muscle tone.  Skin: Skin is warm. Capillary refill takes less than 2 seconds.  Psychiatric: He has a normal mood and affect.  Nursing note  and vitals reviewed.   Neurological Exam:  Mental Status: Alert and oriented to person, place, and time. Attention and concentration normal. Speech clear. Recent memory is intact. Cranial Nerves: Visual fields intact to confrontation in all quadrants bilaterally. EOMI and PERRLA. Bilateral, unidirectional nystagmus with fast twitch to the left with both leftward and rightward gaze; no vertical or rotational component. Facial sensation intact at forehead, maxillary cheek, and chin/mandible bilaterally. No weakness of masticatory muscles. No facial asymmetry or weakness. Hearing grossly normal to finer rub. Uvula is midline, and palate elevates symmetrically. Normal SCM and trapezius strength. Tongue midline without fasciculations Motor: Muscle strength 5/5 in proximal and distal UE and LE bilaterally. No pronator drift. Muscle tone normal. Reflexes: 2+ and symmetrical in all four extremities.  Sensation: Intact to light touch in upper and lower extremities distally bilaterally.  Gait: Normal without ataxia. Coordination: Normal FTN bilaterally. Though mild tremor b/l UE. Normal HTS.  ED Treatments / Results  Labs (all labs ordered are listed, but only abnormal  results are displayed) Labs Reviewed  COMPREHENSIVE METABOLIC PANEL - Abnormal; Notable for the following:       Result Value   CO2 21 (*)    Glucose, Bld 154 (*)    BUN 26 (*)    Creatinine, Ser 1.44 (*)    Calcium 8.6 (*)    Total Protein 6.4 (*)    GFR calc non Af Amer 45 (*)    GFR calc Af Amer 53 (*)    All other components within normal limits  CBG MONITORING, ED - Abnormal; Notable for the following:    Glucose-Capillary 155 (*)    All other components within normal limits  I-STAT CHEM 8, ED - Abnormal; Notable for the following:    BUN 29 (*)    Creatinine, Ser 1.50 (*)    Glucose, Bld 153 (*)    Calcium, Ion 1.14 (*)    All other components within normal limits  LIPASE, BLOOD  CBC  ETHANOL  PROTIME-INR  APTT  DIFFERENTIAL  RAPID URINE DRUG SCREEN, HOSP PERFORMED  URINALYSIS, ROUTINE W REFLEX MICROSCOPIC (NOT AT [facility])  I-STAT TROPOININ, ED    EKG  EKG Interpretation  Date/Time:  Monday November 30 2016 13:15:52 EST Ventricular Rate:  90 PR Interval:    QRS Duration: 98 QT Interval:  389 QTC Calculation: 476 R Axis:   -37 Text Interpretation:  Sinus rhythm Consider left atrial enlargement Left axis deviation Low voltage, precordial leads Abnormal R-wave progression, early transition Borderline prolonged QT interval No significant change since last tracing Confirmed by Sheina Mcleish MD, Lysbeth Galas [phone]) on 11/30/2016 4:10:38 PM       Radiology Ct Head Code Stroke W/o Cm  Result Date: 11/30/2016 CLINICAL DATA:  Code stroke. Dizziness and emesis. Blurred vision beginning at 12:30 today. EXAM: CT HEAD WITHOUT CONTRAST TECHNIQUE: Contiguous axial images were obtained from the base of the skull through the vertex without intravenous contrast. COMPARISON:  None. FINDINGS: Brain: Mild generalized atrophy and white matter disease is present bilaterally. The basal ganglia are intact. Insular ribbon is normal bilaterally. Brainstem and cerebellum are within normal limits. The  ventricles are proportionate to the degree of atrophy. No significant extra-axial fluid collection is present. Vascular: Atherosclerotic calcifications are present within the cavernous internal carotid arteries bilaterally. No hyperdense vessel is present. Skull: The calvarium is intact. No focal lytic or blastic lesions are present. Sinuses/Orbits: The paranasal sinuses and mastoid air cells are clear. ASPECTS [facility] Stroke Program Early CT Score) -  Ganglionic level infarction (caudate, lentiform nuclei, internal capsule, insula, M1-M3 cortex): 7/7 - Supraganglionic infarction (M4-M6 cortex): 3/3 Total score (0-10 with 10 being normal): 10/10 IMPRESSION: 1. Mild atrophy and white matter disease likely reflects the sequela of chronic microvascular ischemia. 2. No acute intracranial abnormality. 3. ASPECTS is 10/10 These results were called by telephone at the time of interpretation on 11/30/2016 at 2:53 pm to Dr. Duffy Bruce , who verbally acknowledged these results. Electronically Signed   By: San Morelle M.D.   On: 11/30/2016 14:54    Procedures .Critical Care Performed by: Duffy Bruce Authorized by: Duffy Bruce   Critical care provider statement:    Critical care time (minutes):  35   Critical care time was exclusive of:  Separately billable procedures and treating other patients   Critical care was necessary to treat or prevent imminent or life-threatening deterioration of the following conditions:  CNS failure or compromise   Critical care was time spent personally by me on the following activities:  Development of treatment plan with patient or surrogate, discussions with consultants, evaluation of patient's response to treatment, examination of patient, obtaining history from patient or surrogate, ordering and performing treatments and interventions, ordering and review of laboratory studies, ordering and review of radiographic studies, pulse oximetry, re-evaluation of  patient's condition and review of old charts   I assumed direction of critical care for this patient from another provider in my specialty: no     (including critical care time)  Medications Ordered in ED Medications  alteplase (ACTIVASE) 1 mg/mL infusion 84 mg (84 mg Intravenous Transfusing/Transfer 11/30/16 1604)   stroke: mapping our early stages of recovery book (not administered)  0.9 %  sodium chloride infusion ( Intravenous Transfusing/Transfer 11/30/16 1605)  acetaminophen (TYLENOL) tablet 650 mg (not administered)    Or  acetaminophen (TYLENOL) suppository 650 mg (not administered)  senna-docusate (Senokot-S) tablet 1 tablet (not administered)  pantoprazole (PROTONIX) injection 40 mg (not administered)  ondansetron (ZOFRAN) injection 4 mg (not administered)  diazepam (VALIUM) injection 2.5 mg (2.5 mg Intravenous Given 11/30/16 1335)  ondansetron (ZOFRAN) injection 4 mg (4 mg Intravenous Given 11/30/16 1406)  diazepam (VALIUM) injection 2 mg (2 mg Intravenous Given 11/30/16 1406)  metoCLOPramide (REGLAN) injection 10 mg (10 mg Intravenous Given 11/30/16 1436)  promethazine (PHENERGAN) injection 12.5 mg (12.5 mg Intravenous Given 11/30/16 1553)     Initial Impression / Assessment and Plan / ED Course  I have reviewed the triage vital signs and the nursing notes.  Pertinent labs & imaging results that were available during my care of the patient were reviewed by me and considered in my medical decision making (see chart for details).  Clinical Course     80 year old male with past medical history as above who presents with acute onset of vertigo and vomiting. On arrival, vital signs are as above. Primary suspicion is possible peripheral vertigo given unidirectional nystagmus with chronic tinnitus consistent with in her ear pathology. Initial assessment delayed due to vomiting, inability to participate, as well as difficulty with describing patient's vision changes. However, after full  history taking and discussion with wife, I am concerned about possible CVA though peripheral vertigo also equally as likely. I also noticed a mild change in his nystagmus pattern during exam, c/f developing CVA. Activated CODE STROKE and discussed with Dr. Leonel Ramsay. He has no dysphagia, dysarthria, or other evidence of significant posterior CVA at this time. Vital signs are stable. Initial CT delayed due to active vomiting and patient  was given IV Valium 2, Zofran, and Reglan. There is no obvious bleed on my preliminary review and patient transferred to [facility] for evaluation.  Final Clinical Impressions(s) / ED Diagnoses   Final diagnoses:  Stroke [facility])  Stroke [facility])  Stroke [facility])    New Prescriptions Current Discharge Medication List       Duffy Bruce, MD 11/30/16 1614

## 2016-11-30 NOTE — Progress Notes (Signed)
Pt and his wife are very concerned that pt get his antirejection drugs, and that he not be overcharged for them. Pt wants his wife to bring his pillboxes from home so he can take all the pills in the PM box. I told the pt and his wife that first I need an order to administer these medications, then, they may either bring their anti-rejection meds from home (in the prescribed bottle), which I can then take to the pharmacy, then they can send up individual doses, or they can just take the hospital's pills. Dr. Erlinda Hong stated that when the wife comes back from home with the current medication dosages, the RN should call him for an order.

## 2016-11-30 NOTE — ED Notes (Signed)
Unable to perform swallow screen, pt actively vomiting. MD notified

## 2016-12-01 ENCOUNTER — Telehealth: Payer: Self-pay | Admitting: Family Medicine

## 2016-12-01 ENCOUNTER — Inpatient Hospital Stay (HOSPITAL_COMMUNITY): Payer: Medicare HMO

## 2016-12-01 DIAGNOSIS — I639 Cerebral infarction, unspecified: Secondary | ICD-10-CM

## 2016-12-01 DIAGNOSIS — I6789 Other cerebrovascular disease: Secondary | ICD-10-CM

## 2016-12-01 DIAGNOSIS — E785 Hyperlipidemia, unspecified: Secondary | ICD-10-CM

## 2016-12-01 LAB — VAS US CAROTID
LCCAPSYS: 187 cm/s
LEFT ECA DIAS: 0 cm/s
LEFT VERTEBRAL DIAS: -6 cm/s
LICADDIAS: -17 cm/s
LICAPSYS: -76 cm/s
Left CCA dist dias: -16 cm/s
Left CCA dist sys: -120 cm/s
Left CCA prox dias: 1 cm/s
Left ICA dist sys: -78 cm/s
Left ICA prox dias: -9 cm/s
RIGHT ECA DIAS: -2 cm/s
RIGHT VERTEBRAL DIAS: -9 cm/s
Right CCA prox dias: 0 cm/s
Right CCA prox sys: -133 cm/s
Right cca dist sys: -70 cm/s

## 2016-12-01 LAB — LIPID PANEL
CHOLESTEROL: 120 mg/dL (ref 0–200)
HDL: 42 mg/dL (ref >40)
LDL Cholesterol: 65 mg/dL (ref 0–99)
TRIGLYCERIDES: 63 mg/dL (ref <150)
Total CHOL/HDL Ratio: 2.9 RATIO
VLDL: 13 mg/dL (ref 0–40)

## 2016-12-01 LAB — URINALYSIS, ROUTINE W REFLEX MICROSCOPIC
BILIRUBIN URINE: NEGATIVE
GLUCOSE, UA: NEGATIVE mg/dL
Hgb urine dipstick: NEGATIVE
KETONES UR: NEGATIVE mg/dL
Leukocytes, UA: NEGATIVE
NITRITE: NEGATIVE
PH: 6.5 (ref 5.0–8.0)
PROTEIN: NEGATIVE mg/dL
Specific Gravity, Urine: 1.012 (ref 1.005–1.030)

## 2016-12-01 LAB — BASIC METABOLIC PANEL
Anion gap: 8 (ref 5–15)
BUN: 20 mg/dL (ref 6–20)
CALCIUM: 8.6 mg/dL — AB (ref 8.9–10.3)
CO2: 23 mmol/L (ref 22–32)
CREATININE: 1.54 mg/dL — AB (ref 0.61–1.24)
Chloride: 109 mmol/L (ref 101–111)
GFR, EST AFRICAN AMERICAN: 48 mL/min — AB (ref >60)
GFR, EST NON AFRICAN AMERICAN: 42 mL/min — AB (ref >60)
Glucose, Bld: 118 mg/dL — ABNORMAL HIGH (ref 65–99)
Potassium: 4.1 mmol/L (ref 3.5–5.1)
SODIUM: 140 mmol/L (ref 135–145)

## 2016-12-01 LAB — ECHOCARDIOGRAM COMPLETE
HEIGHTINCHES: 70 in
WEIGHTICAEL: 3333.36 [oz_av]

## 2016-12-01 LAB — CBC
HCT: 38.7 % — ABNORMAL LOW (ref 39.0–52.0)
HEMOGLOBIN: 12.4 g/dL — AB (ref 13.0–17.0)
MCH: 27.5 pg (ref 26.0–34.0)
MCHC: 32 g/dL (ref 30.0–36.0)
MCV: 85.8 fL (ref 78.0–100.0)
PLATELETS: 187 10*3/uL (ref 150–400)
RBC: 4.51 MIL/uL (ref 4.22–5.81)
RDW: 13.8 % (ref 11.5–15.5)
WBC: 10.1 10*3/uL (ref 4.0–10.5)

## 2016-12-01 MED ORDER — LABETALOL HCL 5 MG/ML IV SOLN: 10.0000 mg | INTRAVENOUS | Status: DC | PRN

## 2016-12-01 MED ORDER — CLOPIDOGREL BISULFATE 75 MG PO TABS
75.0000 mg | ORAL_TABLET | Freq: Every day | ORAL | Status: DC
  Administered 2016-12-01 – 2016-12-05 (×5): 75 mg via ORAL
  Filled 2016-12-01 (×5): qty 1

## 2016-12-01 MED ORDER — ENOXAPARIN SODIUM 40 MG/0.4ML ~~LOC~~ SOLN
40.0000 mg | SUBCUTANEOUS | Status: DC
  Administered 2016-12-02 – 2016-12-04 (×3): 40 mg via SUBCUTANEOUS
  Filled 2016-12-01 (×4): qty 0.4

## 2016-12-01 NOTE — Care Management Note (Signed)
Case Management Note  Patient Details  Name: Curtis Jimenez MRN: [id number] Date of Birth: [birth year]  Subjective/Objective:  Pt admitted on 11/30/16 s/p stroke with TPA given.  PTA, pt independent, resides at home with spouse.                    Action/Plan: PT recommending CIR consult.  Await OT consult and rehab MD consult.  Will follow for discharge planning as pt progresses.    Expected Discharge Date:                  Expected Discharge Plan:  [place]  In-House Referral:     Discharge planning Services  CM Consult  Post Acute Care Choice:    Choice offered to:     DME Arranged:    DME Agency:     HH Arranged:    [place] Agency:     Status of Service:  In process, will continue to follow  If discussed at Long Length of Stay Meetings, dates discussed:    Additional Comments:  Reinaldo Raddle, RN, BSN  Trauma/Neuro ICU Case Manager [phone]

## 2016-12-01 NOTE — Telephone Encounter (Signed)
Email received from patient's daughter:  Curtis Jimenez is a patient of Dr Anitra Lauth. Curtis Jimenez saw him a few weeks ago with blood pressure issues. I am also a patient Curtis Jimenez). I want Dr Anitra Lauth to know that Curtis Jimenez is in [facility] ICU for a mild stroke. He is having eye issues and terrible nausea and dizziness. I do not know how it works when you are admitted by ambulance like this and bypass the family doctor. He is at [place] /Neuro. Curtis Jimenez is a kidney transplant patient and on lots of rxs for blood pressure, anti rejection etc. I also sent a message to Dr Mercy Moore, his kidney specialist. His kidney seem to be holding up fine. Just wanted Don's doctors to know of this development. Thanks [employer] [phone]

## 2016-12-01 NOTE — Progress Notes (Addendum)
Inpatient Rehabilitation  Patient was screened by Gerlean Ren for appropriateness for an Inpatient Acute Rehab consult. PT is currently recommending IP rehab.   At this time, we are recommending Inpatient Rehab consult.    [place] Admissions Coordinator Cell [phone] Office [phone]

## 2016-12-01 NOTE — Evaluation (Signed)
Physical Therapy Evaluation Patient Details Name: Curtis Jimenez MRN: [id number] DOB: 01/15/1936 Today's Date: 12/01/2016   History of Present Illness  Patient is a 80 y/o male with hx of HTN, HLD, bipolar disorder, s/p cadaver renal transplant, gout and chronic renal insufficiency presents as code stroke due to vertigo and blurry vision. Head CT-unremarkable. Exam showed left nystagmus, left arm mild ataxia and right mild facial droop concerning for small brainstem infarct. tPA given. MRI pending.  Clinical Impression  Patient presents with nausea, dizziness with movement, incoordination BLEs, truncal ataxia and nystagmus noted post mobility s/p above. Tolerated standing and taking a few steps in room but required Mod A of 2 for balance/safety due to incoordination. Pt with + emesis post activity. RN present in room. Pt is independent PTA and lives with wife. Would benefit from CIR to maximize independence and mobility prior to return home. MRI pending.    Follow Up Recommendations CIR    Equipment Recommendations  Other (comment) (TBA)    Recommendations for Other Services Rehab consult;OT consult     Precautions / Restrictions Precautions Precautions: Fall Restrictions Weight Bearing Restrictions: No      Mobility  Bed Mobility Overal bed mobility: Needs Assistance Bed Mobility: Sit to Supine     Supine to sit: Min guard;HOB elevated Sit to supine: Min guard   General bed mobility comments: Able to get to EOB wihtout assist, increased time and cues for log roll technique. use of rail. No dizziness.  Transfers Overall transfer level: Needs assistance Equipment used: 2 person hand held assist Transfers: Sit to/from Stand Sit to Stand: Min assist         General transfer comment: Assist to power to standing with cues for hand placement/technique. Truncal sway noted in standing. Reports no dizziness but feeling "funny." Transferred to chair post ambulation bout.    Ambulation/Gait Ambulation/Gait assistance: Mod assist;+2 physical assistance;+2 safety/equipment Ambulation Distance (Feet): 15 Feet Assistive device: 2 person hand held assist Gait Pattern/deviations: Step-through pattern;Decreased stride length;Wide base of support;Ataxic Gait velocity: decreased   General Gait Details: Very slow, deliberate gait with incoordination BLEs. Some truncal ataxia noted. Mod A for balance due to dysequilibirum. + emesis post ambulation and horizontal nystagmus noted post ambulation.  Stairs            Wheelchair Mobility    Modified Rankin (Stroke Patients Only) Modified Rankin (Stroke Patients Only) Pre-Morbid Rankin Score: No symptoms Modified Rankin: Moderately severe disability     Balance Overall balance assessment: Needs assistance Sitting-balance support: Feet supported;No upper extremity supported Sitting balance-Leahy Scale: Fair     Standing balance support: During functional activity Standing balance-Leahy Scale: Poor Standing balance comment: Reliant on UEs for support in standing due to truncal ataxia and impaired balance.                             Pertinent Vitals/Pain Pain Assessment: No/denies pain    Home Living Family/patient expects to be discharged to:: Private residence Living Arrangements: Spouse/significant other Available Help at Discharge: Family;Available 24 hours/day Type of Home: House Home Access: Stairs to enter   [employer] of Steps: 1 small step Home Layout: One level Home Equipment: None      Prior Function Level of Independence: Independent         Comments: Likes to read, watch TV and walk.     Hand Dominance   Dominant Hand: Right    Extremity/Trunk Assessment  Upper Extremity Assessment: Defer to OT evaluation           Lower Extremity Assessment: Generalized weakness (Some incoordination noted during functional tasks BLEs.)          Communication   Communication: No difficulties  Cognition Arousal/Alertness: Awake/alert Behavior During Therapy: WFL for tasks assessed/performed Overall Cognitive Status: Within Functional Limits for tasks assessed                      General Comments      Exercises     Assessment/Plan    PT Assessment Patient needs continued PT services  PT Problem List Decreased strength;Decreased mobility;Decreased coordination;Decreased balance;Decreased activity tolerance          PT Treatment Interventions DME instruction;Therapeutic activities;Gait training;Therapeutic exercise;Patient/family education;Balance training;Functional mobility training;Stair training;Neuromuscular re-education    PT Goals (Current goals can be found in the Care Plan section)  Acute Rehab PT Goals Patient Stated Goal: to be able to walk and go home PT Goal Formulation: With patient Time For Goal Achievement: 12/15/16 Potential to Achieve Goals: Good    Frequency Min 4X/week   Barriers to discharge        Co-evaluation               End of Session Equipment Utilized During Treatment: Gait belt Activity Tolerance: Other (comment) (emesis, nausea) Patient left: in bed;with call bell/phone within reach;with nursing/sitter in room Nurse Communication: Mobility status         Time: 1104-1130 PT Time Calculation (min) (ACUTE ONLY): 26 min   Charges:   PT Evaluation $PT Eval Moderate Complexity: 1 Procedure PT Treatments $Gait Training: 8-22 mins   PT G Codes:        Sang Blount A Kijuan Gallicchio 12/01/2016, 12:19 PM Wray Kearns, Marvin, DPT [phone]

## 2016-12-01 NOTE — Progress Notes (Signed)
STROKE TEAM PROGRESS NOTE   HISTORY OF PRESENT ILLNESS (per record) Curtis Jimenez is an 80 y.o. male with hx of HTN, CKD, renal transplant 4 years ago at [facility] presented as code stroke due to vertigo and blurry vision. He was sitting in chair at 12:30pm ib 11/30/2016 (LKW) when he stood up, suddenly he felt room spinning and upside down. He had to sit down but then developed nausea and vomiting with blurry vision and double vision. He denies any weakness or numbness, LOC or swallowing difficulty. EMS called, he was given zofran en route to [facility] ED. Had CT head showed no acute abnormalities. Transfer to [facility] for code stroke. On arrival, pt still nausea and later vomited. Exam showed left nystagmus, left arm mild ataxia and right mild facial droop. Concerning for small brainstem infarct. tPA given. Pt was admitted to ICU after tPA.    SUBJECTIVE (INTERVAL HISTORY) Wife is at bedside. Pt has no acute event overnight. However, he vomited after just a couple bites of breakfast. In addition, he became dizzy and nauseous with vomiting working with PT/OT this morning. MRI pending. On my examination in the morning, his nystagmus and dysmetria as well as dizziness were getting better.   OBJECTIVE Temp:  [97.8 F (36.6 C)-98.3 F (36.8 C)] 97.9 F (36.6 C) (12/05 1200) Pulse Rate:  [78-98] 91 (12/05 0800) Cardiac Rhythm: Normal sinus rhythm (12/05 0800) Resp:  [0-25] 17 (12/05 0800) BP: (100-149)/(52-95) 116/67 (12/05 0800) SpO2:  [89 %-95 %] 91 % (12/05 0800) Weight:  [94.5 kg (208 lb 5.4 oz)] 94.5 kg (208 lb 5.4 oz) (12/04 2000)  CBC:  Recent Labs Lab 11/30/16 1334 11/30/16 1432 12/01/16 0246  WBC 7.5  --  10.1  NEUTROABS 5.7  --   --   HGB 13.8 13.9 12.4*  HCT 42.5 41.0 38.7*  MCV 86.9  --  85.8  PLT 216  --  283    Basic Metabolic Panel:  Recent Labs Lab 11/30/16 1334 11/30/16 1432 12/01/16 0246  NA 138 138 140  K 4.3 4.8 4.1  CL 109 107 109  CO2 21*  --  23  GLUCOSE 154* 153*  118*  BUN 26* 29* 20  CREATININE 1.44* 1.50* 1.54*  CALCIUM 8.6*  --  8.6*    Lipid Panel:    Component Value Date/Time   CHOL 120 12/01/2016 0246   TRIG 63 12/01/2016 0246   TRIG 130 12/15/2006 1045   HDL 42 12/01/2016 0246   CHOLHDL 2.9 12/01/2016 0246   VLDL 13 12/01/2016 0246   LDLCALC 65 12/01/2016 0246   HgbA1c: No results found for: HGBA1C Urine Drug Screen: No results found for: LABOPIA, COCAINSCRNUR, LABBENZ, AMPHETMU, THCU, LABBARB    IMAGING I have personally reviewed the radiological images below and agree with the radiology interpretations.  Ct Head Code Stroke W/o Cm 11/30/2016 1. Mild atrophy and white matter disease likely reflects the sequela of chronic microvascular ischemia. 2. No acute intracranial abnormality. 3. ASPECTS is 10/10   MRI/MRA Head pending   2-D echocardiogram - Left ventricle: The cavity size was mildly dilated. Systolic function was normal. The estimated ejection fraction was in the range of 55% to 60%. Wall motion was normal; there were no regional wall motion abnormalities. There was an increased relative contribution of atrial contraction to ventricular filling. Doppler parameters are consistent with abnormal left ventricular relaxation (grade 1 diastolic dysfunction). - Aorta: Aortic root dimension: 38 mm (ED). - Aortic root: The aortic root was mildly  dilated. - Mitral valve: There was mild regurgitation.  Carotid Doppler   There is 1-39% bilateral ICA stenosis. Vertebral artery flow is antegrade.    PHYSICAL EXAM  Temp:  [97.9 F (36.6 C)-98.3 F (36.8 C)] 98.2 F (36.8 C) (12/05 1600) Pulse Rate:  [82-98] 83 (12/05 1500) Resp:  [0-25] 16 (12/05 1500) BP: (100-133)/(52-77) 116/69 (12/05 1500) SpO2:  [89 %-95 %] 95 % (12/05 1500) Weight:  [208 lb 5.4 oz (94.5 kg)] 208 lb 5.4 oz (94.5 kg) (12/04 2000)  General - Well nourished, well developed, in mild distress with motion related dizziness.  Ophthalmologic - Fundi not  visualized due to noncooperation.  Cardiovascular - Regular rate and rhythm.  Mental Status -  Level of arousal and orientation to time, place, and person were intact. Language including expression, naming, repetition, comprehension was assessed and found intact.  Cranial Nerves II - XII - II - Visual field intact OU. III, IV, VI - Extraocular movements intact. V - Facial sensation intact bilaterally. VII - right nasolabial fold subtle flattening. VIII - Hearing & vestibular exam showed left sided unsustained nystagmus on left gaze. X - Palate elevates symmetrically. XI - Chin turning & shoulder shrug intact bilaterally. XII - Tongue protrusion  in the middle  Motor Strength - [name] strength was normal in all extremities and pronator drift was absent.  Bulk was normal and fasciculations were absent.   Motor Tone - Muscle tone was assessed at the neck and appendages and was normal.  Reflexes - [name] reflexes were 1+ in all extremities and he had no pathological reflexes.  Sensory - Light touch, temperature/pinprick were assessed and were symmetrical.    Coordination - The patient had subtle dysmetria on the left FTN and normal rapid alternating test.    Gait and Station -  deferred to PT/OT   ASSESSMENT/PLAN [name] is a 80 y.o. male with history of hypertension, chronic kidney disease with renal transplant 4 years ago presenting with vertigo and blurry vision. He received IV TPA 11/30/2016 at 1521.   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 Yes; Fluid consistency: Thin  aspirin 81  mg daily prior to admission, now on No antithrombotic as within 24 hours of TPA. Plan to resume aspirin if 24 hour imaging negative for hemorrhage. Consider change to Plavix   Ongoing aggressive stroke risk factor management  Therapy recommendations:  pending   Disposition:  pending   Hypertension  Stable Permissive hypertension (OK if < 180/105) but gradually normalize in 5-7 days Long-term BP goal normotensive  Hyperlipidemia  Home meds:  zocor 10 mg daily, resumed in hospital  LDL 65, goal < 70  Continue statin at discharge  Other Stroke Risk Factors  Advanced age  Overweight, Body mass index is 29.89 kg/m., recommend weight loss, diet and exercise as appropriate   Other Active Problems  Bipolar disorder  BPH  Chronic renal insufficiency stage III due to lithium toxicity over 30 years ago, status post cadaver renal transplant, baseline Cr 1.5 post [facility] day # 1  This patient is critically ill due to  acute posterior circulation stroke s/p TPA, vertigo and vomiting and at significant risk of neurological worsening, death form recurrent stroke, hemorrhagic transformation, aspiration pneumonia, and respiratory failure. This patient's care requires constant monitoring of vital signs, hemodynamics, respiratory and cardiac monitoring, review of multiple databases, neurological assessment, discussion with family, other specialists and medical decision making of high complexity. I spent 40 minutes of neurocritical care time in the care of this patient.  Curtis Hawking, MD PhD Stroke Neurology 12/01/2016 5:17 PM   To contact Stroke Continuity provider, please refer to [email or web address] After hours, contact General Neurology

## 2016-12-01 NOTE — Telephone Encounter (Signed)
Noted  

## 2016-12-01 NOTE — Progress Notes (Addendum)
PT Cancellation Note  Patient Details Name: Curtis Jimenez MRN: [id number] DOB: Oct 22, 1936   Cancelled Treatment:    Reason Eval/Treat Not Completed: Patient not medically ready Pt on strict bedrest. S/p tPA yesterday. Will follow up as appropriate when activity orders have been increased.   Attempted to see pt later in AM and pt getting procedures done in room. Plan for MRI later in PM. Will follow up as time allows.   Marguarite Arbour A Kimie Pidcock 12/01/2016, 8:37 AM Wray Kearns, PT, DPT [phone]

## 2016-12-01 NOTE — Progress Notes (Signed)
Physical medicine rehabilitation consult requested chart reviewed. Patient currently in multiple studies for workup of CVA. Will follow-up accordingly when studies completed

## 2016-12-01 NOTE — Progress Notes (Signed)
**  Preliminary report by tech**  Carotid artery duplex complete. Findings are consistent with a 1-39 percent stenosis involving the right internal carotid artery and the left internal carotid artery. The vertebral arteries demonstrate antegrade flow.  12/01/16 10:32 AM Curtis Jimenez RVT

## 2016-12-01 NOTE — Evaluation (Signed)
Speech Language Pathology Evaluation Patient Details Name: Curtis Jimenez MRN: [id number] DOB: 08/02/1936 Today's Date: 12/01/2016 Time: 3532-9924 SLP Time Calculation (min) (ACUTE ONLY): 25 min  Problem List:  Patient Active Problem List   Diagnosis Date Noted  . Stroke ([place]) 11/30/2016  . Essential hypertension 01/16/2015  . S/p cadaver renal transplant 11/06/2012  . Chronic kidney disease (CKD), stage IV (severe) ([place]) 03/18/2012  . BENIGN PROSTATIC HYPERTROPHY 04/18/2008  . EXTRINSIC ASTHMA, UNSPECIFIED 12/19/2007  . PSA, INCREASED 12/09/2007  . Hypothyroidism 08/12/2007  . Bipolar disorder ([place]) 07/27/2007  . Hyperlipidemia 07/07/2007  . Gout 07/07/2007  . HYPERTENSION 07/07/2007  . GERD 07/07/2007   Past Medical History:  Past Medical History:  Diagnosis Date  . Anemia   . Arthritis   . Basal cell carcinoma    neck (skin MD 2X per year)  . Bipolar disorder ([place])   . BPH (benign prostatic hyperplasia)    with elevated PSA; followed by Dr. Rosana Hoes at [facility]  . Cataract   . Chronic renal insufficiency, stage III (moderate)    Last f/u with neph 07/06/16: Cr stable at 1.57 (GFR 42)  . Gallbladder polyp 2015   Asymptomatic  . GERD (gastroesophageal reflux disease)   . Gout   . Hyperlipidemia   . Hypertension   . Hypothyroidism   . Restless legs syndrome   . S/p cadaver renal transplant 2013   Secondary to HTN +lithium toxicity over 30 yrs caused kidney destruction [facility] transplant MDs)   Past Surgical History:  Past Surgical History:  Procedure Laterality Date  . AV FISTULA PLACEMENT  04/08/2012   Procedure: ARTERIOVENOUS (AV) FISTULA CREATION;  Surgeon: Angelia Mould, MD;  Location: [facility] OR;  Service: Vascular;  Laterality: Left;  Creation of left brachial cephalic arteriovenous fistula  . COLONOSCOPY  2013   Normal except diverticulosis (recall 10 yrs)  . dilation for GERD    . KIDNEY TRANSPLANT  11/06/12   [place] (cadaveric)  . PROSTATE  BIOPSY  2011   no malignancy   HPI:  Curtis Jimenez an 80 y.o.malewith hx of HTN, CKD, renal transplant 4 years ago, bipolar, GERD, HTN  presented as code stroke due to vertigo and blurry vision. CT mild atrophy and white matter disease likely reflects the sequela, of chronic microvascular ischemia, no acute intracranial abnormality. MRI pending.   Assessment / Plan / Recommendation Clinical Impression  Pt without complaints re: speech, language or cognition and wife in agreement. He scored a 28/30 on the [facility] standardized assessment. Primary deficits appear to be visual/possibly vestibular in nature- has PT/OT orders. No needs for ST at this time.      SLP Assessment  Patient does not need any further Speech Lanaguage Pathology Services    Follow Up Recommendations  None    Frequency and Duration           SLP Evaluation Cognition  Overall Cognitive Status: Within Functional Limits for tasks assessed Arousal/Alertness: Awake/alert Orientation Level: Oriented X4 Attention: Sustained Sustained Attention: Appears intact Memory: Appears intact Awareness: Appears intact Problem Solving: Appears intact Safety/Judgment: Appears intact       Comprehension  Auditory Comprehension Overall Auditory Comprehension: Appears within functional limits for tasks assessed Visual Recognition/Discrimination Discrimination: Not tested Reading Comprehension Reading Status: Within funtional limits    Expression Expression Primary Mode of Expression: Verbal Verbal Expression Overall Verbal Expression: Appears within functional limits for tasks assessed Initiation: No impairment Level of Generative/Spontaneous Verbalization: Conversation Repetition: No impairment Naming: No impairment  Pragmatics: No impairment Written Expression Dominant Hand: Right Written Expression: Not tested   Oral / Motor  Oral Motor/Sensory Function Overall Oral Motor/Sensory Function: Within functional  limits (minimal right lingual deviation) Facial ROM: Within Functional Limits Facial Symmetry: Within Functional Limits Facial Strength: Within Functional Limits Facial Sensation: Within Functional Limits Lingual Symmetry:  (slight deviation to right) Lingual Strength: Within Functional Limits Lingual Sensation: Within Functional Limits Velum: Within Functional Limits Mandible: Within Functional Limits Motor Speech Overall Motor Speech: Appears within functional limits for tasks assessed Respiration: Within functional limits Phonation: Normal Resonance: Within functional limits Articulation: Within functional limitis Intelligibility: Intelligible Motor Planning: Witnin functional limits   GO                    Houston Siren 12/01/2016, 8:46 AM   Orbie Pyo Colvin Caroli.Ed [employer] [phone]

## 2016-12-01 NOTE — Progress Notes (Signed)
Echocardiogram 2D Echocardiogram has been performed.  Curtis Jimenez 12/01/2016, 10:48 AM

## 2016-12-01 NOTE — Telephone Encounter (Signed)
FYI

## 2016-12-02 DIAGNOSIS — I6302 Cerebral infarction due to thrombosis of basilar artery: Secondary | ICD-10-CM

## 2016-12-02 DIAGNOSIS — R269 Unspecified abnormalities of gait and mobility: Secondary | ICD-10-CM

## 2016-12-02 DIAGNOSIS — I69393 Ataxia following cerebral infarction: Secondary | ICD-10-CM

## 2016-12-02 DIAGNOSIS — I69398 Other sequelae of cerebral infarction: Secondary | ICD-10-CM

## 2016-12-02 LAB — CBC
HCT: 38.3 % — ABNORMAL LOW (ref 39.0–52.0)
Hemoglobin: 12.3 g/dL — ABNORMAL LOW (ref 13.0–17.0)
MCH: 28.1 pg (ref 26.0–34.0)
MCHC: 32.1 g/dL (ref 30.0–36.0)
MCV: 87.4 fL (ref 78.0–100.0)
PLATELETS: 180 10*3/uL (ref 150–400)
RBC: 4.38 MIL/uL (ref 4.22–5.81)
RDW: 14.1 % (ref 11.5–15.5)
WBC: 7.4 10*3/uL (ref 4.0–10.5)

## 2016-12-02 LAB — BASIC METABOLIC PANEL
ANION GAP: 6 (ref 5–15)
BUN: 16 mg/dL (ref 6–20)
CO2: 26 mmol/L (ref 22–32)
Calcium: 8.7 mg/dL — ABNORMAL LOW (ref 8.9–10.3)
Chloride: 109 mmol/L (ref 101–111)
Creatinine, Ser: 1.63 mg/dL — ABNORMAL HIGH (ref 0.61–1.24)
GFR calc Af Amer: 45 mL/min — ABNORMAL LOW (ref >60)
GFR, EST NON AFRICAN AMERICAN: 39 mL/min — AB (ref >60)
Glucose, Bld: 91 mg/dL (ref 65–99)
POTASSIUM: 4.4 mmol/L (ref 3.5–5.1)
SODIUM: 141 mmol/L (ref 135–145)

## 2016-12-02 LAB — MRSA PCR SCREENING: MRSA BY PCR: NEGATIVE

## 2016-12-02 LAB — HEMOGLOBIN A1C
Hgb A1c MFr Bld: 5.9 % — ABNORMAL HIGH (ref 4.8–5.6)
Mean Plasma Glucose: 123 mg/dL

## 2016-12-02 NOTE — Progress Notes (Signed)
Physical Therapy Treatment Patient Details Name: DECARLO RIVET MRN: [id number] DOB: 12/07/1936 Today's Date: 12/02/2016    History of Present Illness Patient is a 80 y/o male with hx of HTN, HLD, bipolar disorder, s/p cadaver renal transplant, gout and chronic renal insufficiency presents as code stroke due to vertigo and blurry vision. Head CT-unremarkable. Exam showed left nystagmus, left arm mild ataxia and right mild facial droop concerning for small brainstem infarct. tPA given. MRI (-).    PT Comments    Pt mobilizing much better than yesterday, ambulated with RW with min A and no AD with mod A. Continues to have R lean and mild ataxia. Gaze stabilization used throughout session and did not prevent all dizziness but lessened and no nausea this session. PT will continue to follow.   Follow Up Recommendations  CIR     Equipment Recommendations  Other (comment) (TBD)    Recommendations for Other Services Rehab consult;OT consult     Precautions / Restrictions Precautions Precautions: Fall Restrictions Weight Bearing Restrictions: No    Mobility  Bed Mobility Overal bed mobility: Needs Assistance Bed Mobility: Supine to Sit     Supine to sit: Min guard     General bed mobility comments: pt received in chair  Transfers Overall transfer level: Needs assistance Equipment used: Rolling walker (2 wheeled) Transfers: Sit to/from Stand Sit to Stand: Min assist         General transfer comment: min A to steady with sit to stand, heavier (25%) min A for stand to sit due to lack of control and  right lean. Stabilized gaze before transferring which he reports helps significantly  Ambulation/Gait Ambulation/Gait assistance: Min assist;Mod assist;+2 safety/equipment Ambulation Distance (Feet): 125 Feet Assistive device: 1 person hand held assist;Rolling walker (2 wheeled) Gait Pattern/deviations: Step-through pattern;Decreased weight shift to left;Decreased stance  time - left;Staggering right Gait velocity: decreased Gait velocity interpretation: <1.8 ft/sec, indicative of risk for recurrent falls General Gait Details: right lean but able to ambulate with RW with min A (not leaning enough to cause RW to tip). "A" kept in front of pt throughout ambulation for gaze stabilization. Next ambulated with therapist on R and pt able to walk with min A. However, when therapist on L, pt needed mod A and +2 for safety on R side to prevent R sided LOB.    Stairs            Wheelchair Mobility    Modified Rankin (Stroke Patients Only) Modified Rankin (Stroke Patients Only) Pre-Morbid Rankin Score: No symptoms Modified Rankin: Moderately severe disability     Balance Overall balance assessment: Needs assistance Sitting-balance support: Feet supported Sitting balance-Leahy Scale: Fair   Postural control: Right lateral lean Standing balance support: Single extremity supported Standing balance-Leahy Scale: Poor Standing balance comment: unable to maintain standing without UE support                    Cognition Arousal/Alertness: Awake/alert Behavior During Therapy: WFL for tasks assessed/performed Overall Cognitive Status: Within Functional Limits for tasks assessed                      Exercises      General Comments General comments (skin integrity, edema, etc.): pt given several "A"'s and tape to put them around room as needed since it is significantly decreasing his dizziness and nausea      Pertinent Vitals/Pain Pain Assessment: No/denies pain    Home Living Family/patient expects  to be discharged to:: Private residence Living Arrangements: Spouse/significant other Available Help at Discharge: Family;Available 24 hours/day Type of Home: House Home Access: Stairs to enter   Home Layout: One level Home Equipment: None      Prior Function Level of Independence: Independent      Comments: Likes to read, watch TV  and walk.   PT Goals (current goals can now be found in the care plan section) Acute Rehab PT Goals Patient Stated Goal: to be able to walk and go home PT Goal Formulation: With patient Time For Goal Achievement: 12/15/16 Potential to Achieve Goals: Good Progress towards PT goals: Progressing toward goals    Frequency    Min 4X/week      PT Plan Current plan remains appropriate    Co-evaluation             End of Session Equipment Utilized During Treatment: Gait belt Activity Tolerance: Patient tolerated treatment well Patient left: in chair;with call bell/phone within reach;with family/visitor present     Time: 1202-1227 PT Time Calculation (min) (ACUTE ONLY): 25 min  Charges:  $Gait Training: 23-37 mins                    G Codes:     Leighton Roach, PT  Acute Rehab Services  [place] 12/02/2016, 2:50 PM

## 2016-12-02 NOTE — Evaluation (Signed)
Occupational Therapy Evaluation Patient Details Name: Curtis Jimenez MRN: [id number] DOB: [birth year] Today's Date: 12/02/2016    History of Present Illness Patient is a 80 y/o male with hx of HTN, HLD, bipolar disorder, s/p cadaver renal transplant, gout and chronic renal insufficiency presents as code stroke due to vertigo and blurry vision. Head CT-unremarkable. Exam showed left nystagmus, left arm mild ataxia and right mild facial droop concerning for small brainstem infarct. tPA given. MRI (-).   Clinical Impression   PT admitted with workup underway currently for CVA with balance deficits present affecting all adls. . Pt currently with functional limitiations due to the deficits listed below (see OT problem list). PTA was MOD I with all adls.  Pt will benefit from skilled OT to increase their independence and safety with adls and balance to allow discharge CIR.      Follow Up Recommendations  CIR    Equipment Recommendations  Other (comment) (RW)    Recommendations for Other Services Rehab consult     Precautions / Restrictions Precautions Precautions: Fall      Mobility Bed Mobility Overal bed mobility: Needs Assistance Bed Mobility: Supine to Sit     Supine to sit: Min guard     General bed mobility comments: use of bed rail  Transfers Overall transfer level: Needs assistance Equipment used: Rolling walker (2 wheeled) Transfers: Sit to/from Stand Sit to Stand: Min assist         General transfer comment: cues for hand placement. pt with LOB x1 during session    Balance Overall balance assessment: Needs assistance Sitting-balance support: Bilateral upper extremity supported;Feet supported Sitting balance-Leahy Scale: Fair     Standing balance support: Single extremity supported;During functional activity Standing balance-Leahy Scale: Poor Standing balance comment: pt with LOB pivoting from sink level to chair. Pt able to self correct LOB due to  RW use                            ADL Overall ADL's : Needs assistance/impaired     Grooming: Wash/dry hands;Wash/dry face;Minimal assistance;Standing Grooming Details (indicate cue type and reason): pt requires UE support for balance. Pt with RW used for stability Upper Body Bathing: Minimal assitance   Lower Body Bathing: Minimal assistance       Lower Body Dressing: Minimal assistance Lower Body Dressing Details (indicate cue type and reason): requires seated position and able to cross bil LE to don socks.  Toilet Transfer: Minimal Print production planner Details (indicate cue type and reason): requires use of RW due to unsteady balance. Pt would require incr *(A) without DME use Toileting- Clothing Manipulation and Hygiene: Minimal assistance       Functional mobility during ADLs: Minimal assistance;Rolling walker General ADL Comments: Pt educated on gaze stabilization      Vision Vision Assessment?: Yes Eye Alignment: Impaired (comment) Ocular Range of Motion: Within Functional Limits Tracking/Visual Pursuits: Requires cues, head turns, or add eye shifts to track Saccades: Additional eye shifts occurred during testing Diplopia Assessment: Present all the time/all directions Additional Comments: Pt demonstrates 1.5 digit with testing in all peripheral vision. pt with R eye occluded still seeing 1.5 digits. pt with nystagmus and rotation noted. pt with greater observation of movement with superior gaze. pt also noted to have greater L eye lid opening compared to R eye   Perception     Praxis      Pertinent Vitals/Pain Pain Assessment: No/denies  pain     Hand Dominance Right   Extremity/Trunk Assessment Upper Extremity Assessment Upper Extremity Assessment: Overall WFL for tasks assessed   Lower Extremity Assessment Lower Extremity Assessment: Defer to PT evaluation   Cervical / Trunk Assessment Cervical / Trunk Assessment: Normal    Communication Communication Communication: No difficulties   Cognition Arousal/Alertness: Awake/alert Behavior During Therapy: WFL for tasks assessed/performed Overall Cognitive Status: Within Functional Limits for tasks assessed                     General Comments       Exercises       Shoulder Instructions      Home Living Family/patient expects to be discharged to:: Private residence Living Arrangements: Spouse/significant other Available Help at Discharge: Family;Available 24 hours/day Type of Home: House Home Access: Stairs to enter [employer] of Steps: 1 small step   Home Layout: One level               Home Equipment: None      Lives With: Spouse    Prior Functioning/Environment Level of Independence: Independent        Comments: Likes to read, watch TV and walk.        OT Problem List: Decreased strength;Decreased activity tolerance;Impaired balance (sitting and/or standing);Decreased knowledge of use of DME or AE;Decreased knowledge of precautions;Decreased safety awareness;Decreased coordination;Impaired vision/perception   OT Treatment/Interventions: Self-care/ADL training;Therapeutic exercise;Neuromuscular education;DME and/or AE instruction;Therapeutic activities;Visual/perceptual remediation/compensation;Patient/family education;Balance training    OT Goals(Current goals can be found in the care plan section) Acute Rehab OT Goals Patient Stated Goal: to be able to walk and go home OT Goal Formulation: With patient Time For Goal Achievement: 12/16/16 Potential to Achieve Goals: Good  OT Frequency: Min 2X/week   Barriers to D/C:            Co-evaluation              End of Session Equipment Utilized During Treatment: Gait belt;Rolling walker Nurse Communication: Mobility status;Precautions  Activity Tolerance: Patient tolerated treatment well Patient left: in chair;with call bell/phone within reach;with  family/visitor present   Time: 1100-1138 OT Time Calculation (min): 38 min Charges:  OT General Charges $OT Visit: 1 Procedure OT Evaluation $OT Eval High Complexity: 1 Procedure OT Treatments $Self Care/Home Management : 23-37 mins G-Codes:    Parke Poisson B 17-Dec-2016, 11:57 AM  Jeri Modena   OTR/L Pager: [phone] Office: [phone] .

## 2016-12-02 NOTE — Consult Note (Signed)
Physical Medicine and Rehabilitation Consult Reason for Consult: Posterior circulation infarct/pontine small vessel disease Referring Physician: Dr.Xu   HPI: Curtis Jimenez is a 80 y.o. right handed male with history of bipolar disorder, hypertension, hyperlipidemia, CKD with renal transplant 4 years ago at [facility]. Per chart review patient independent prior to admission living with his wife. One level home with one-step entry. Presented 11/30/2016 with vertigo, nausea and vomiting as well as blurred vision. Cranial CT scan negative for acute changes. Patient did receive TPA. MRI completed showing no acute intracranial abnormality. MRA of the head without evidence of large vessel occlusion aneurysm or significant stenosis. Admission creatinine 1.44. Echocardiogram with ejection fraction of 08% grade 1 diastolic dysfunction. Carotid Dopplers with no ICA stenosis. 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 Maintained on Plavix for CVA prophylaxis. Subcutaneous Lovenox for DVT prophylaxis. Tolerating a regular diet. Physical therapy evaluation completed 12/01/2016 with recommendations of physical medicine rehabilitation consult.  Patient and wife are asking whether he needs an eye patch. He noted some jumping of the eyes when he first had his stroke.  No double vision Notes chronic UE tremor attributed to being on Lithium for 34yrs   Review of Systems  Constitutional: Negative for chills and fever.  HENT: Negative for hearing loss and tinnitus.   Eyes: Positive for blurred vision and double vision. Negative for photophobia and pain.  Respiratory: Negative for cough and shortness of breath.   Cardiovascular: Positive for leg swelling. Negative for chest pain and palpitations.  Gastrointestinal: Positive for constipation, nausea and vomiting. Negative for abdominal pain.       GERD  Genitourinary: Positive for  urgency. Negative for dysuria and flank pain.  Musculoskeletal: Positive for joint pain and myalgias.       Restless leg syndrome  Skin: Negative for rash.  Neurological: Positive for dizziness and headaches. Negative for seizures and loss of consciousness.  Psychiatric/Behavioral:       Bipolar disorder  All other systems reviewed and are negative.  Past Medical History:  Diagnosis Date  . Anemia   . Arthritis   . Basal cell carcinoma    neck (skin MD 2X per year)  . Bipolar disorder ([place])   . BPH (benign prostatic hyperplasia)    with elevated PSA; followed by Dr. Rosana Hoes at [facility]  . Cataract   . Chronic renal insufficiency, stage III (moderate)    Last f/u with neph 07/06/16: Cr stable at 1.57 (GFR 42)  . Gallbladder polyp 2015   Asymptomatic  . GERD (gastroesophageal reflux disease)   . Gout   . Hyperlipidemia   . Hypertension   . Hypothyroidism   . Restless legs syndrome   . 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 AV FISTULA PLACEMENT  04/08/2012   Procedure: ARTERIOVENOUS (AV) FISTULA CREATION;  Surgeon: Angelia Mould, MD;  Location: [facility] OR;  Service: Vascular;  Laterality: Left;  Creation of left brachial cephalic arteriovenous fistula  . COLONOSCOPY  2013   Normal except diverticulosis (recall 10 yrs)  . dilation for GERD    . KIDNEY TRANSPLANT  11/06/12   [place] (cadaveric)  . PROSTATE BIOPSY  2011   no malignancy   Family History  Problem Relation Age of Onset  . Cancer Father  BLADDER  . Heart disease Father    Social History:  reports that he quit smoking about 40 years ago. His smoking use included Pipe and Cigars. He quit after 16.00 years of use. He has never used smokeless tobacco. He reports that he does not drink alcohol or use drugs. Allergies:  Allergies  Allergen Reactions  .  Amantadine Hcl     REACTION: itching  . Chlorpromazine Hcl Other (See Comments)    REACTION: "fell flat on face"   Medications Prior to Admission  Medication Sig Dispense Refill  . acetaminophen (TYLENOL) 325 MG tablet Take 650 mg by mouth every 6 (six) hours as needed for mild pain.    Marland Kitchen allopurinol (ZYLOPRIM) 300 MG tablet Take 1 tablet (300 mg total) by mouth daily. 90 tablet 3  . amLODipine (NORVASC) 5 MG tablet Take 5 mg by mouth daily.     Marland Kitchen aspirin 81 MG chewable tablet Chew 81 mg by mouth daily.     . carvedilol (COREG) 6.25 MG tablet Take 1 tablet (6.25 mg total) by mouth 2 (two) times daily with a meal. (Patient taking differently: Take 6.25-12.5 mg by mouth See admin instructions. 12.5mg  in am, 6.25mg  in pm) 180 tablet 3  . Cholecalciferol (VITAMIN D) 2000 UNITS CAPS Take 1 capsule by mouth daily.    . colchicine 0.6 MG tablet 2 tabs at onset of gout flare, then 1 tab one hour later, then starting the next day take 1 tab bid until gout flare is resolved 20 tablet 1  . hydrOXYzine (ATARAX/VISTARIL) 25 MG tablet Take 25 mg by mouth 3 (three) times daily.     Marland Kitchen levothyroxine (SYNTHROID, LEVOTHROID) 25 MCG tablet Take 1 tablet (25 mcg total) by mouth daily. 90 tablet 3  . mycophenolate (MYFORTIC) 180 MG EC tablet Take 360 mg by mouth 2 (two) times daily.     . Omega-3 1000 MG CAPS Take 1,000 mg by mouth 2 (two) times daily. Take 2 g by mouth 2 (two) times daily as needed.    Marland Kitchen omeprazole (PRILOSEC) 20 MG capsule Take 1 capsule (20 mg total) by mouth daily. 90 capsule 3  . risperiDONE (RISPERDAL) 2 MG tablet Take 1 mg by mouth daily. Take 1/2 tablet daily.     . simvastatin (ZOCOR) 40 MG tablet 1/2 tab po qd (Patient taking differently: Take 10 mg by mouth daily. ) 45 tablet 3  . sulfamethoxazole-trimethoprim (BACTRIM,SEPTRA) 400-80 MG tablet Take 1 tablet by mouth every Monday, Wednesday, and Friday.    . tacrolimus (PROGRAF) 1 MG capsule Take 0.5-1 mg by mouth See admin instructions. 1  mg in am, 0.5mg  in pm    . vitamin B-12 (CYANOCOBALAMIN) 100 MCG tablet Take 100 mcg by mouth daily.      Home: Home Living Family/patient expects to be discharged to:: Private residence Living Arrangements: Spouse/significant other Available Help at Discharge: Family, Available 24 hours/day Type of Home: House Home Access: Stairs to enter [employer] of Steps: 1 small step Home Layout: One level Home Equipment: None  Lives With: Spouse  Functional History: Prior Function Level of Independence: Independent Comments: Likes to read, watch TV and walk. Functional Status:  Mobility: Bed Mobility Overal bed mobility: Needs Assistance Bed Mobility: Sit to Supine Supine to sit: Min guard, HOB elevated Sit to supine: Min guard General bed mobility comments: Able to get to EOB wihtout assist, increased time and cues for log roll technique. use of rail. No dizziness. Transfers Overall transfer level: Needs  assistance Equipment used: 2 person hand held assist Transfers: Sit to/from Stand Sit to Stand: Min assist General transfer comment: Assist to power to standing with cues for hand placement/technique. Truncal sway noted in standing. Reports no dizziness but feeling "funny." Transferred to chair post ambulation bout.  Ambulation/Gait Ambulation/Gait assistance: Mod assist, +2 physical assistance, +2 safety/equipment Ambulation Distance (Feet): 15 Feet Assistive device: 2 person hand held assist Gait Pattern/deviations: Step-through pattern, Decreased stride length, Wide base of support, Ataxic General Gait Details: Very slow, deliberate gait with incoordination BLEs. Some truncal ataxia noted. Mod A for balance due to dysequilibirum. + emesis post ambulation and horizontal nystagmus noted post ambulation. Gait velocity: decreased    ADL:    Cognition: Cognition Overall Cognitive Status: Within Functional Limits for tasks assessed Arousal/Alertness:  Awake/alert Orientation Level: Oriented X4 Attention: Sustained Sustained Attention: Appears intact Memory: Appears intact Awareness: Appears intact Problem Solving: Appears intact Safety/Judgment: Appears intact Cognition Arousal/Alertness: Awake/alert Behavior During Therapy: WFL for tasks assessed/performed Overall Cognitive Status: Within Functional Limits for tasks assessed  Blood pressure (!) 115/57, pulse 74, temperature 98.3 F (36.8 C), temperature source Oral, resp. rate 16, height 5\' 10"  (1.778 m), weight 96.8 kg (213 lb 6.5 oz), SpO2 91 %. Physical Exam  Vitals reviewed. Constitutional: He is oriented to person, place, and time. He appears well-developed.  HENT:  Head: Normocephalic.  Eyes:  Pupils reactive to light. Eye patch in place  Neck: Normal range of motion. Neck supple. No thyromegaly present.  Cardiovascular: Normal rate and regular rhythm.   Respiratory: Effort normal and breath sounds normal. No respiratory distress.  GI: Soft. Bowel sounds are normal. He exhibits no distension.  Neurological: He is alert and oriented to person, place, and time.  Follows commands. Fair awareness of deficits  Skin: Skin is warm and dry.  bilateral UE tremor no ataxia on FNF, - heel to shin, leans to Right  with standing but not with sitting, amb minA handheld assist,   Results for orders placed or performed during the hospital encounter of 11/30/16 (from the past 24 hour(s))  CBC     Status: Abnormal   Collection Time: 12/02/16  3:18 AM  Result Value Ref Range   WBC 7.4 4.0 - 10.5 K/uL   RBC 4.38 4.22 - 5.81 MIL/uL   Hemoglobin 12.3 (L) 13.0 - 17.0 g/dL   HCT 38.3 (L) 39.0 - 52.0 %   MCV 87.4 78.0 - 100.0 fL   MCH 28.1 26.0 - 34.0 pg   MCHC 32.1 30.0 - 36.0 g/dL   RDW 14.1 11.5 - 15.5 %   Platelets 180 150 - 400 K/uL  Basic metabolic panel     Status: Abnormal   Collection Time: 12/02/16  3:18 AM  Result Value Ref Range   Sodium 141 135 - 145 mmol/L   Potassium  4.4 3.5 - 5.1 mmol/L   Chloride 109 101 - 111 mmol/L   CO2 26 22 - 32 mmol/L   Glucose, Bld 91 65 - 99 mg/dL   BUN 16 6 - 20 mg/dL   Creatinine, Ser 1.63 (H) 0.61 - 1.24 mg/dL   Calcium 8.7 (L) 8.9 - 10.3 mg/dL   GFR calc non Af Amer 39 (L) >60 mL/min   GFR calc Af Amer 45 (L) >60 mL/min   Anion gap 6 5 - 15   Mr Brain Wo Contrast  Result Date: 12/01/2016 CLINICAL DATA:  80 y/o M; 24 hours follow-up IV tPA for vertigo and nausea. EXAM: MRI  HEAD WITHOUT CONTRAST MRA HEAD WITHOUT CONTRAST TECHNIQUE: Multiplanar, multiecho pulse sequences of the brain and surrounding structures were obtained without intravenous contrast. Angiographic images of the head were obtained using MRA technique without contrast. COMPARISON:  11/30/2016 CT head FINDINGS: MRI HEAD FINDINGS Brain: No acute infarction, hemorrhage, hydrocephalus, extra-axial collection or mass lesion. Scattered foci of T2 FLAIR hyperintense signal abnormality in subcortical and periventricular white matter are compatible with mild chronic microvascular ischemic changes. Mild brain parenchymal volume loss. Vascular: Normal flow voids. Skull and upper cervical spine: Normal marrow signal. Sinuses/Orbits: Mild diffuse paranasal sinus disease and left posterior ethmoid air cell small mucous retention cyst. No abnormal signal of mastoid air cells. Orbits are unremarkable. Other: None. MRA HEAD FINDINGS Anterior circulation: No occlusion, aneurysm, or significant stenosis is identified. Posterior circulation: No occlusion, aneurysm, or significant stenosis is identified. Anatomic variant: Patent anterior communicating artery and left posterior communicating artery. No right posterior communicating artery identified, likely hypoplastic or absent. IMPRESSION: 1. No acute intracranial abnormality identified. 2. Mild chronic microvascular ischemic changes and parenchymal volume loss of the brain. 3. Mild paranasal sinus disease. 4. Patent circle of Willis without  evidence for large vessel occlusion, aneurysm, or significant stenosis. Electronically Signed   By: Kristine Garbe M.D.   On: 12/01/2016 18:51   Mr Jodene Nam Head/brain ZJ Cm  Result Date: 12/01/2016 CLINICAL DATA:  80 y/o M; 24 hours follow-up IV tPA for vertigo and nausea. EXAM: MRI HEAD WITHOUT CONTRAST MRA HEAD WITHOUT CONTRAST TECHNIQUE: Multiplanar, multiecho pulse sequences of the brain and surrounding structures were obtained without intravenous contrast. Angiographic images of the head were obtained using MRA technique without contrast. COMPARISON:  11/30/2016 CT head FINDINGS: MRI HEAD FINDINGS Brain: No acute infarction, hemorrhage, hydrocephalus, extra-axial collection or mass lesion. Scattered foci of T2 FLAIR hyperintense signal abnormality in subcortical and periventricular white matter are compatible with mild chronic microvascular ischemic changes. Mild brain parenchymal volume loss. Vascular: Normal flow voids. Skull and upper cervical spine: Normal marrow signal. Sinuses/Orbits: Mild diffuse paranasal sinus disease and left posterior ethmoid air cell small mucous retention cyst. No abnormal signal of mastoid air cells. Orbits are unremarkable. Other: None. MRA HEAD FINDINGS Anterior circulation: No occlusion, aneurysm, or significant stenosis is identified. Posterior circulation: No occlusion, aneurysm, or significant stenosis is identified. Anatomic variant: Patent anterior communicating artery and left posterior communicating artery. No right posterior communicating artery identified, likely hypoplastic or absent. IMPRESSION: 1. No acute intracranial abnormality identified. 2. Mild chronic microvascular ischemic changes and parenchymal volume loss of the brain. 3. Mild paranasal sinus disease. 4. Patent circle of Willis without evidence for large vessel occlusion, aneurysm, or significant stenosis. Electronically Signed   By: Kristine Garbe M.D.   On: 12/01/2016 18:51   Ct  Head Code Stroke W/o Cm  Result Date: 11/30/2016 CLINICAL DATA:  Code stroke. Dizziness and emesis. Blurred vision beginning at 12:30 today. EXAM: CT HEAD WITHOUT CONTRAST TECHNIQUE: Contiguous axial images were obtained from the base of the skull through the vertex without intravenous contrast. COMPARISON:  None. FINDINGS: Brain: Mild generalized atrophy and white matter disease is present bilaterally. The basal ganglia are intact. Insular ribbon is normal bilaterally. Brainstem and cerebellum are within normal limits. The ventricles are proportionate to the degree of atrophy. No significant extra-axial fluid collection is present. Vascular: Atherosclerotic calcifications are present within the cavernous internal carotid arteries bilaterally. No hyperdense vessel is present. Skull: The calvarium is intact. No focal lytic or blastic lesions are present. Sinuses/Orbits: The paranasal  sinuses and mastoid air cells are clear. ASPECTS [facility] Stroke Program Early CT Score) - Ganglionic level infarction (caudate, lentiform nuclei, internal capsule, insula, M1-M3 cortex): 7/7 - Supraganglionic infarction (M4-M6 cortex): 3/3 Total score (0-10 with 10 being normal): 10/10 IMPRESSION: 1. Mild atrophy and white matter disease likely reflects the sequela of chronic microvascular ischemia. 2. No acute intracranial abnormality. 3. ASPECTS is 10/10 These results were called by telephone at the time of interpretation on 11/30/2016 at 2:53 pm to Dr. Duffy Bruce , who verbally acknowledged these results. Electronically Signed   By: San Morelle M.D.   On: 11/30/2016 14:54    Assessment/Plan: Diagnosis: probable small brainstem infarct with trunkal ataxia 1. Does the need for close, 24 hr/day medical supervision in concert with the patient's rehab needs make it unreasonable for this patient to be served in a less intensive setting? Potentially 2. Co-Morbidities requiring supervision/potential complications: CKD,s/p  renal transplant HTN,  3. Due to bladder management, bowel management, safety, disease management, medication administration and patient education, does the patient require 24 hr/day rehab nursing? Yes 4. Does the patient require coordinated care of a physician, rehab nurse, PT, OT to address physical and functional deficits in the context of the above medical diagnosis(es)? Potentially Addressing deficits in the following areas: balance, endurance, locomotion, strength, transferring, bathing, dressing and toileting 5. Can the patient actively participate in an intensive therapy program of at least 3 hrs of therapy per day at least 5 days per week? Yes 6. The potential for patient to make measurable gains while on inpatient rehab is fair 7. Anticipated functional outcomes upon discharge from inpatient rehab are modified independent  with PT, modified independent with OT, n/a with SLP. 8. Estimated rehab length of stay to reach the above functional goals is: 5d 9. Does the patient have adequate social supports and living environment to accommodate these discharge functional goals? Yes 10. Anticipated D/C setting: Home 11. Anticipated post D/C treatments: Outpatient therapy 12. Overall Rehab/Functional Prognosis: excellent  RECOMMENDATIONS: This patient's condition is appropriate for continued rehabilitative care in the following setting: would rec CIR if still at [facility] in am Patient has agreed to participate in recommended program. Yes Note that insurance prior authorization may be required for reimbursement for recommended care.  Comment: does not need eye patch, discussed with wife, eye symptoms mainly nystagmus    12/02/2016

## 2016-12-02 NOTE — Progress Notes (Signed)
Pt had a concern about taking two blood thiners (plavix and lovernox) due to his kidney transplant, Dr Cheral Marker (on call) paged and notified, said it is okay to use both but gave the option to use SCD if the pt chooses, same explained to pt, pt opted to take the Lovernox, same given, will however continue to monitor. Obasogie-Asidi, Jordy Hewins Efe

## 2016-12-02 NOTE — Progress Notes (Signed)
STROKE TEAM PROGRESS NOTE   SUBJECTIVE (INTERVAL HISTORY) Wife is at bedside. Pt has no acute event overnight. Feels much better today. Able to walk with PT/OT without difficulty although slow. No more dizziness or vomiting. MRI negative. Consider DWI negative stroke.    OBJECTIVE Temp:  [98 F (36.7 C)-98.6 F (37 C)] 98 F (36.7 C) (12/06 1039) Pulse Rate:  [74-83] 76 (12/06 1039) Cardiac Rhythm: Normal sinus rhythm (12/06 0856) Resp:  [14-18] 17 (12/06 1039) BP: (114-128)/(57-73) 122/59 (12/06 1039) SpO2:  [89 %-96 %] 93 % (12/06 1039) Weight:  [213 lb 6.5 oz (96.8 kg)] 213 lb 6.5 oz (96.8 kg) (12/05 2025)  CBC:  Recent Labs Lab 11/30/16 1334  12/01/16 0246 12/02/16 0318  WBC 7.5  --  10.1 7.4  NEUTROABS 5.7  --   --   --   HGB 13.8  < > 12.4* 12.3*  HCT 42.5  < > 38.7* 38.3*  MCV 86.9  --  85.8 87.4  PLT 216  --  187 180  < > = values in this interval not displayed.  Basic Metabolic Panel:   Recent Labs Lab 12/01/16 0246 12/02/16 0318  NA 140 141  K 4.1 4.4  CL 109 109  CO2 23 26  GLUCOSE 118* 91  BUN 20 16  CREATININE 1.54* 1.63*  CALCIUM 8.6* 8.7*    Lipid Panel:     Component Value Date/Time   CHOL 120 12/01/2016 0246   TRIG 63 12/01/2016 0246   TRIG 130 12/15/2006 1045   HDL 42 12/01/2016 0246   CHOLHDL 2.9 12/01/2016 0246   VLDL 13 12/01/2016 0246   LDLCALC 65 12/01/2016 0246   HgbA1c:  Lab Results  Component Value Date   HGBA1C 5.9 (H) 12/01/2016   Urine Drug Screen: No results found for: LABOPIA, COCAINSCRNUR, LABBENZ, AMPHETMU, THCU, LABBARB    IMAGING I have personally reviewed the radiological images below and agree with the radiology interpretations.  Ct Head Code Stroke W/o Cm 11/30/2016 1. Mild atrophy and white matter disease likely reflects the sequela of chronic microvascular ischemia. 2. No acute intracranial abnormality. 3. ASPECTS is 10/10   Mri and Mra Brain Wo Contrast 12/01/2016 IMPRESSION: 1. No acute intracranial  abnormality identified. 2. Mild chronic microvascular ischemic changes and parenchymal volume loss of the brain. 3. Mild paranasal sinus disease. 4. Patent circle of Willis without evidence for large vessel occlusion, aneurysm, or significant stenosis.   2-D echocardiogram - Left ventricle: The cavity size was mildly dilated. Systolic function was normal. The estimated ejection fraction was in the range of 55% to 60%. Wall motion was normal; there were no regional wall motion abnormalities. There was an increased relative contribution of atrial contraction to ventricular filling. Doppler parameters are consistent with abnormal left ventricular relaxation (grade 1 diastolic dysfunction). - Aorta: Aortic root dimension: 38 mm (ED). - Aortic root: The aortic root was mildly dilated. - Mitral valve: There was mild regurgitation.  Carotid Doppler   There is 1-39% bilateral ICA stenosis. Vertebral artery flow is antegrade.    PHYSICAL EXAM  Temp:  [98 F (36.7 C)-98.6 F (37 C)] 98 F (36.7 C) (12/06 1039) Pulse Rate:  [74-83] 76 (12/06 1039) Resp:  [14-18] 17 (12/06 1039) BP: (114-128)/(57-73) 122/59 (12/06 1039) SpO2:  [89 %-96 %] 93 % (12/06 1039) Weight:  [213 lb 6.5 oz (96.8 kg)] 213 lb 6.5 oz (96.8 kg) (12/05 2025)  General - Well nourished, well developed, in mild distress with motion related dizziness.  Ophthalmologic - Fundi not visualized due to noncooperation.  Cardiovascular - Regular rate and rhythm.  Mental Status -  Level of arousal and orientation to time, place, and person were intact. Language including expression, naming, repetition, comprehension was assessed and found intact.  Cranial Nerves II - XII - II - Visual field intact OU. III, IV, VI - Extraocular movements intact. V - Facial sensation intact bilaterally. VII - right nasolabial fold subtle flattening. VIII - Hearing & vestibular exam showed left sided unsustained nystagmus on left gaze. X - Palate  elevates symmetrically. XI - Chin turning & shoulder shrug intact bilaterally. XII - Tongue protrusion  in the middle  Motor Strength - [name] strength was normal in all extremities and pronator drift was absent.  Bulk was normal and fasciculations were absent.   Motor Tone - Muscle tone was assessed at the neck and appendages and was normal.  Reflexes - [name] reflexes were 1+ in all extremities and he had no pathological reflexes.  Sensory - Light touch, temperature/pinprick were assessed and were symmetrical.    Coordination - The patient had no more dysmetria or axiaxia and normal rapid alternating test.    Gait and Station -  walk with walker and slow cautious gait with small stride. No fall   ASSESSMENT/PLAN Curtis Jimenez is a 80 y.o. male with history of hypertension, chronic kidney disease with renal transplant 4 years ago presenting with vertigo and blurry vision. He received IV TPA 11/30/2016 at 1521.   DWI negative posterior stroke, likely located in left pons  Resultant  vertigo, nystagmus, mild right facial droop  MRI  No acute stroke seen  MRA  Unremarkable   Carotid Doppler  no significant stenosis  2D Echo  EF 55-60% with no source of embolus  LDL 65  HgbA1c 5.9  SCDs for VTE prophylaxis Diet Heart Room service appropriate? Yes; Fluid consistency: Thin  aspirin 81 mg daily prior to admission, now on Plavix for stroke prevention  Ongoing aggressive stroke risk factor management  Therapy recommendations:  CIR   Disposition:  pending   Hypertension  Stable Permissive hypertension (OK if < 180/105) but gradually normalize in 5-7 days Long-term BP goal normotensive  Hyperlipidemia  Home meds:  zocor 10 mg daily, resumed in hospital  LDL 65, goal < 70  Continue statin at discharge  Other Stroke Risk Factors  Advanced age  Overweight, Body mass index is 30.62 kg/m., recommend weight loss, diet and exercise as  appropriate   Other Active Problems  Bipolar disorder  BPH  Chronic renal insufficiency stage III due to lithium toxicity over 30 years ago, status post cadaver renal transplant, baseline Cr 1.5 post [facility] day # 2   Rosalin Hawking, MD PhD Stroke Neurology 12/02/2016 12:50 PM   To contact Stroke Continuity provider, please refer to [email or web address] After hours, contact General Neurology

## 2016-12-02 NOTE — Progress Notes (Signed)
Rehab admissions - I will open the case with [facility] medicare requesting acute inpatient rehab admission.  I will update all once I hear back from insurance case manager.  Call me for questions.  #[phone]

## 2016-12-03 DIAGNOSIS — N179 Acute kidney failure, unspecified: Secondary | ICD-10-CM

## 2016-12-03 LAB — CBC
HEMATOCRIT: 38.2 % — AB (ref 39.0–52.0)
HEMOGLOBIN: 12.1 g/dL — AB (ref 13.0–17.0)
MCH: 27.4 pg (ref 26.0–34.0)
MCHC: 31.7 g/dL (ref 30.0–36.0)
MCV: 86.6 fL (ref 78.0–100.0)
Platelets: 186 10*3/uL (ref 150–400)
RBC: 4.41 MIL/uL (ref 4.22–5.81)
RDW: 13.6 % (ref 11.5–15.5)
WBC: 6.7 10*3/uL (ref 4.0–10.5)

## 2016-12-03 LAB — BASIC METABOLIC PANEL
Anion gap: 10 (ref 5–15)
BUN: 24 mg/dL — ABNORMAL HIGH (ref 6–20)
CALCIUM: 8.7 mg/dL — AB (ref 8.9–10.3)
CHLORIDE: 105 mmol/L (ref 101–111)
CO2: 25 mmol/L (ref 22–32)
CREATININE: 1.69 mg/dL — AB (ref 0.61–1.24)
GFR calc Af Amer: 43 mL/min — ABNORMAL LOW (ref >60)
GFR calc non Af Amer: 37 mL/min — ABNORMAL LOW (ref >60)
GLUCOSE: 104 mg/dL — AB (ref 65–99)
Potassium: 4.1 mmol/L (ref 3.5–5.1)
Sodium: 140 mmol/L (ref 135–145)

## 2016-12-03 NOTE — Progress Notes (Signed)
STROKE TEAM PROGRESS NOTE   SUBJECTIVE (INTERVAL HISTORY) Pt sitting in chair with wife at bedside. Pt continues to feel better. However, still not walking as good as before. Pending CIR admission. His Cre increased and found out that his IVF is not running. Will re-start IVF and encourage po intake.    OBJECTIVE Temp:  [97.8 F (36.6 C)-98.6 F (37 C)] 98.6 F (37 C) (12/07 1451) Pulse Rate:  [74-80] 75 (12/07 1451) Cardiac Rhythm: Normal sinus rhythm (12/07 0704) Resp:  [17-18] 17 (12/07 1451) BP: (114-125)/(58-64) 114/60 (12/07 1451) SpO2:  [92 %-96 %] 96 % (12/07 1451)  CBC:  Recent Labs Lab 11/30/16 1334  12/02/16 0318 12/03/16 0352  WBC 7.5  < > 7.4 6.7  NEUTROABS 5.7  --   --   --   HGB 13.8  < > 12.3* 12.1*  HCT 42.5  < > 38.3* 38.2*  MCV 86.9  < > 87.4 86.6  PLT 216  < > 180 186  < > = values in this interval not displayed.  Basic Metabolic Panel:   Recent Labs Lab 12/02/16 0318 12/03/16 0352  NA 141 140  K 4.4 4.1  CL 109 105  CO2 26 25  GLUCOSE 91 104*  BUN 16 24*  CREATININE 1.63* 1.69*  CALCIUM 8.7* 8.7*    Lipid Panel:     Component Value Date/Time   CHOL 120 12/01/2016 0246   TRIG 63 12/01/2016 0246   TRIG 130 12/15/2006 1045   HDL 42 12/01/2016 0246   CHOLHDL 2.9 12/01/2016 0246   VLDL 13 12/01/2016 0246   LDLCALC 65 12/01/2016 0246   HgbA1c:  Lab Results  Component Value Date   HGBA1C 5.9 (H) 12/01/2016   Urine Drug Screen: No results found for: LABOPIA, COCAINSCRNUR, LABBENZ, AMPHETMU, THCU, LABBARB    IMAGING I have personally reviewed the radiological images below and agree with the radiology interpretations.  Ct Head Code Stroke W/o Cm 11/30/2016 1. Mild atrophy and white matter disease likely reflects the sequela of chronic microvascular ischemia. 2. No acute intracranial abnormality. 3. ASPECTS is 10/10   Mri and Mra Brain Wo Contrast 12/01/2016 IMPRESSION: 1. No acute intracranial abnormality identified. 2. Mild chronic  microvascular ischemic changes and parenchymal volume loss of the brain. 3. Mild paranasal sinus disease. 4. Patent circle of Willis without evidence for large vessel occlusion, aneurysm, or significant stenosis.   2-D echocardiogram - Left ventricle: The cavity size was mildly dilated. Systolic function was normal. The estimated ejection fraction was in the range of 55% to 60%. Wall motion was normal; there were no regional wall motion abnormalities. There was an increased relative contribution of atrial contraction to ventricular filling. Doppler parameters are consistent with abnormal left ventricular relaxation (grade 1 diastolic dysfunction). - Aorta: Aortic root dimension: 38 mm (ED). - Aortic root: The aortic root was mildly dilated. - Mitral valve: There was mild regurgitation.  Carotid Doppler   There is 1-39% bilateral ICA stenosis. Vertebral artery flow is antegrade.    PHYSICAL EXAM  Temp:  [97.8 F (36.6 C)-98.6 F (37 C)] 98.6 F (37 C) (12/07 1451) Pulse Rate:  [74-80] 75 (12/07 1451) Resp:  [17-18] 17 (12/07 1451) BP: (114-125)/(58-64) 114/60 (12/07 1451) SpO2:  [92 %-96 %] 96 % (12/07 1451)  General - Well nourished, well developed, in mild distress with motion related dizziness.  Ophthalmologic - Fundi not visualized due to noncooperation.  Cardiovascular - Regular rate and rhythm.  Mental Status -  Level of  arousal and orientation to time, place, and person were intact. Language including expression, naming, repetition, comprehension was assessed and found intact.  Cranial Nerves II - XII - II - Visual field intact OU. III, IV, VI - Extraocular movements intact. V - Facial sensation intact bilaterally. VII - right nasolabial fold subtle flattening. VIII - Hearing & vestibular exam showed left sided unsustained nystagmus on left gaze. X - Palate elevates symmetrically. XI - Chin turning & shoulder shrug intact bilaterally. XII - Tongue protrusion  in the  middle  Motor Strength - [name] strength was normal in all extremities and pronator drift was absent.  Bulk was normal and fasciculations were absent.   Motor Tone - Muscle tone was assessed at the neck and appendages and was normal.  Reflexes - [name] reflexes were 1+ in all extremities and he had no pathological reflexes.  Sensory - Light touch, temperature/pinprick were assessed and were symmetrical.    Coordination - The patient had no more dysmetria or axiaxia and normal rapid alternating test.    Gait and Station -  walk with walker and slow cautious gait with small stride. No fall   ASSESSMENT/PLAN Curtis Jimenez is a 80 y.o. male with history of hypertension, chronic kidney disease with renal transplant 4 years ago presenting with vertigo and blurry vision. He received IV TPA 11/30/2016 at 1521.   DWI negative posterior stroke, likely located in left pons  Resultant  vertigo, nystagmus, mild right facial droop  MRI  No acute stroke seen  MRA  Unremarkable   Carotid Doppler  no significant stenosis  2D Echo  EF 55-60% with no source of embolus  LDL 65  HgbA1c 5.9  Lovenox 40 mg sq daily  for VTE prophylaxis Diet Heart Room service appropriate? Yes; Fluid consistency: Thin  aspirin 81 mg daily prior to admission, now on Plavix for stroke prevention  Ongoing aggressive stroke risk factor management  Therapy recommendations:  CIR. Awaiting insurance approval.  Disposition:  Pending  AKI on CKD s/p renal transplant  Cre 1.44-1.54-1.63-1.69  Continue IVF @ 75cc  Encourage po intake  Monitoring BMP in am  Hypertension  Stable Permissive hypertension (OK if < 180/105) but gradually normalize in 5-7 days Long-term BP goal normotensive  Hyperlipidemia  Home meds:  zocor 10 mg daily, resumed in hospital  LDL 65, goal < 70  Continue statin at discharge  Other Stroke Risk Factors  Advanced age  Overweight, Body mass index is  30.62 kg/m., recommend weight loss, diet and exercise as appropriate   Other Active Problems  Bipolar disorder  BPH  Chronic renal insufficiency stage III due to lithium toxicity over 30 years ago, status post cadaver renal transplant, baseline Cr 1.5 post [facility] day # 3  Rosalin Hawking, MD PhD Stroke Neurology 12/03/2016 5:00 PM   To contact Stroke Continuity provider, please refer to [email or web address] After hours, contact General Neurology

## 2016-12-03 NOTE — Telephone Encounter (Signed)
Noted  

## 2016-12-03 NOTE — Telephone Encounter (Signed)
FYI

## 2016-12-03 NOTE — Progress Notes (Signed)
Occupational Therapy Treatment Patient Details Name: Curtis Jimenez MRN: [id number] DOB: 1936-04-12 Today's Date: 12/03/2016    History of present illness Patient is a 80 y/o male with hx of HTN, HLD, bipolar disorder, s/p cadaver renal transplant, gout and chronic renal insufficiency presents as code stroke due to vertigo and blurry vision. Head CT-unremarkable. Exam showed left nystagmus, left arm mild ataxia and right mild facial droop concerning for small brainstem infarct. tPA given. MRI (-).   OT comments  Pt with less nystagmus than previous session per notes.  Still with vertical nystagmus with rolling from left to right in the bed.  Duration less than one minute overall.  No report of dizziness with sidelying to sit or with mobility to and from the shower.  Pt still with increased right lean and LOB requiring constant min assist during mobility without assistive device.  Will continue to follow feel pt is appropriate for more intensive rehab at CIR level.   Follow Up Recommendations  CIR    Equipment Recommendations  Other (comment) (TBD)    Recommendations for Other Services Rehab consult    Precautions / Restrictions Precautions Precautions: Fall Restrictions Weight Bearing Restrictions: No       Mobility Bed Mobility Overal bed mobility: Needs Assistance Bed Mobility: Supine to Sit     Supine to sit: Min guard     General bed mobility comments: for safety due to recent dizziness/imbalance  Transfers Overall transfer level: Needs assistance Equipment used: None Transfers: Sit to/from Stand Sit to Stand: Min assist         General transfer comment: Min assist for steadiness during standing without use of an assistive device.     Balance Overall balance assessment: Needs assistance Sitting-balance support: Feet supported Sitting balance-Leahy Scale: Fair     Standing balance support: No upper extremity supported Standing balance-Leahy Scale:  Poor Standing balance comment: LOB to the right during functional mobility with hand held assist on the right side.                    ADL Overall ADL's : Needs assistance/impaired     Grooming: Minimal assistance;Brushing hair;Applying deodorant;Standing   Upper Body Bathing: Supervision/ safety;Sitting   Lower Body Bathing: Minimal assistance;Sit to/from stand       Lower Body Dressing: Minimal assistance;Sit to/from stand   Toilet Transfer: Minimal assistance;Grab bars Toilet Transfer Details (indicate cue type and reason): Standing to urinate Toileting- Clothing Manipulation and Hygiene: Minimal assistance   Tub/ Shower Transfer: Walk-in shower;3 in 1;Grab bars   Functional mobility during ADLs: Minimal assistance General ADL Comments: Pt with increased lean and LOB to the right with mobility to and from the shower.  Noted BUE tremors as well with LUE demonstrating slightly worse tremor.  Nystagmus only noted with rolling from right to left side which was in the vertical plane for less than 30 seconds.  No other reported dizziness just unsteadiness during ADL.                  Cognition   Behavior During Therapy: WFL for tasks assessed/performed Overall Cognitive Status: Within Functional Limits for tasks assessed                         Exercises Other Exercises Other Exercises: reviewed and re-educated pt on proper technique for x1 exercises; states he has been doing in numerous times throughout the day  Pertinent Vitals/ Pain       Pain Assessment: Faces Faces Pain Scale: Hurts a little bit Pain Location: right dorsal hand Pain Intervention(s): Limited activity within patient's tolerance     Prior Functioning/Environment              Frequency  Min 3X/week        Progress Toward Goals  OT Goals(current goals can now be found in the care plan section)  Progress towards OT goals: Progressing toward goals  Acute Rehab  OT Goals Patient Stated Goal: to be able to walk and go home  Plan Discharge plan remains appropriate;Frequency needs to be updated          Activity Tolerance Patient tolerated treatment well   Patient Left in bed;with call bell/phone within reach;with bed alarm set (sitting EOB)   Nurse Communication Mobility status        Time: 7867-6720 OT Time Calculation (min): 53 min  Charges: OT General Charges $OT Visit: 1 Procedure OT Treatments $Self Care/Home Management : 53-67 mins (52 mins)  Wynter Grave OTR/L 12/03/2016, 4:55 PM

## 2016-12-03 NOTE — Progress Notes (Signed)
Physical Therapy Treatment Patient Details Name: Curtis Jimenez MRN: [id number] DOB: [birth year] Today's Date: 12/03/2016    History of Present Illness Patient is a 80 y/o male with hx of HTN, HLD, bipolar disorder, s/p cadaver renal transplant, gout and chronic renal insufficiency presents as code stroke due to vertigo and blurry vision. Head CT-unremarkable. Exam showed left nystagmus, left arm mild ataxia and right mild facial droop concerning for small brainstem infarct. tPA given. MRI (-).    PT Comments    Patient continues to report improvement with symptoms (vertigo and imbalance). Continues with Rt shift, however can correct with visual cues for vertical. Head turns in static standing continue to cause LOB.   Follow Up Recommendations  CIR     Equipment Recommendations  Other (comment) (TBD)    Recommendations for Other Services       Precautions / Restrictions Precautions Precautions: Fall Restrictions Weight Bearing Restrictions: No    Mobility  Bed Mobility Overal bed mobility: Needs Assistance Bed Mobility: Supine to Sit     Supine to sit: Min guard     General bed mobility comments: for safety due to recent dizziness/imbalance  Transfers Overall transfer level: Needs assistance Equipment used: Rolling walker (2 wheeled) Transfers: Sit to/from Stand Sit to Stand: Min assist         General transfer comment: min A to steady with sit to stand  Ambulation/Gait Ambulation/Gait assistance: Min assist Ambulation Distance (Feet): 80 Feet (standing rest/regroup; 80) Assistive device: Rolling walker (2 wheeled) Gait Pattern/deviations: Step-through pattern;Decreased weight shift to left Gait velocity: decreased   General Gait Details: Rt lean very minor and able to correct with looking forward (with and without "A"); imbalance noted with 90 degree turns with cues for pt to stop and re-balance   Stairs            Wheelchair Mobility     Modified Rankin (Stroke Patients Only) Modified Rankin (Stroke Patients Only) Pre-Morbid Rankin Score: No symptoms Modified Rankin: Moderately severe disability     Balance Overall balance assessment: Needs assistance Sitting-balance support: Feet supported Sitting balance-Leahy Scale: Fair     Standing balance support: No upper extremity supported Standing balance-Leahy Scale: Fair Standing balance comment: begins to have incr sway and imbalance with head turns in static stance                    Cognition Arousal/Alertness: Awake/alert Behavior During Therapy: WFL for tasks assessed/performed Overall Cognitive Status: Within Functional Limits for tasks assessed                      Exercises Other Exercises Other Exercises: reviewed and re-educated pt on proper technique for x1 exercises; states he has been doing in numerous times throughout the day    General Comments        Pertinent Vitals/Pain Pain Assessment: No/denies pain    Home Living                      Prior Function            PT Goals (current goals can now be found in the care plan section) Acute Rehab PT Goals Patient Stated Goal: to be able to walk and go home Time For Goal Achievement: 12/15/16 Progress towards PT goals: Progressing toward goals    Frequency    Min 4X/week      PT Plan Current plan remains appropriate    Co-evaluation  End of Session Equipment Utilized During Treatment: Gait belt Activity Tolerance: Patient tolerated treatment well Patient left: with call bell/phone within reach;in bed;with bed alarm set;Other (comment) (CIR PA)     Time: 8110-3159 PT Time Calculation (min) (ACUTE ONLY): 17 min  Charges:  $Gait Training: 8-22 mins                    G Codes:      Jeanie Cooks Kikue Gerhart 12-23-2016, 3:28 PM Pager [phone]

## 2016-12-03 NOTE — Telephone Encounter (Signed)
SW patient's wife, patient is still in the hospital after having a small stroke. They are waiting for insurance to approve physical therapy. She will call when he gets out & schedule a hospital followup.

## 2016-12-03 NOTE — Progress Notes (Signed)
Rehab admissions - I met with patient and his wife at the bedside.  We are waiting to hear back from insurance carrier about potential acute inpatient rehab admission.  I will update all if I hear back from [facility] today.  #[phone]

## 2016-12-04 LAB — BASIC METABOLIC PANEL
Anion gap: 11 (ref 5–15)
BUN: 22 mg/dL — ABNORMAL HIGH (ref 6–20)
CALCIUM: 8.7 mg/dL — AB (ref 8.9–10.3)
CO2: 23 mmol/L (ref 22–32)
CREATININE: 1.6 mg/dL — AB (ref 0.61–1.24)
Chloride: 107 mmol/L (ref 101–111)
GFR calc non Af Amer: 40 mL/min — ABNORMAL LOW (ref >60)
GFR, EST AFRICAN AMERICAN: 46 mL/min — AB (ref >60)
Glucose, Bld: 85 mg/dL (ref 65–99)
Potassium: 4 mmol/L (ref 3.5–5.1)
SODIUM: 141 mmol/L (ref 135–145)

## 2016-12-04 LAB — CBC
HCT: 37.5 % — ABNORMAL LOW (ref 39.0–52.0)
Hemoglobin: 12.2 g/dL — ABNORMAL LOW (ref 13.0–17.0)
MCH: 27.9 pg (ref 26.0–34.0)
MCHC: 32.5 g/dL (ref 30.0–36.0)
MCV: 85.6 fL (ref 78.0–100.0)
PLATELETS: 188 10*3/uL (ref 150–400)
RBC: 4.38 MIL/uL (ref 4.22–5.81)
RDW: 13.5 % (ref 11.5–15.5)
WBC: 6 10*3/uL (ref 4.0–10.5)

## 2016-12-04 NOTE — Progress Notes (Signed)
Physical Therapy Treatment Patient Details Name: Curtis Jimenez MRN: [id number] DOB: [birth year] Today's Date: 12/04/2016    History of Present Illness Patient is a 80 y/o male with hx of HTN, HLD, bipolar disorder, s/p cadaver renal transplant, gout and chronic renal insufficiency presents as code stroke due to vertigo and blurry vision. Head CT-unremarkable. Exam showed left nystagmus, left arm mild ataxia and right mild facial droop concerning for small brainstem infarct. tPA given. MRI (-).    PT Comments    Pt performed increased gait and able to maintain balance with RW.  Wife present and observed session.  Pt would benefit from skilled rehab in short term SNF after being denied CIR.  If SNF denies patient will require HHPT, RW and BSC.     Follow Up Recommendations  SNF (If patient denied SNF with require HHPT and 24 hour supervision with use of RW)     Equipment Recommendations  Rolling walker with 5" wheels;3in1 (PT)    Recommendations for Other Services       Precautions / Restrictions Precautions Precautions: Fall Restrictions Weight Bearing Restrictions: No    Mobility  Bed Mobility               General bed mobility comments: Pt received standing with nursing present ambulating in halls.    Transfers Overall transfer level: Needs assistance Equipment used: Rolling walker (2 wheeled) Transfers: Sit to/from Stand Sit to Stand: Supervision         General transfer comment: Cues for hand placement.    Ambulation/Gait Ambulation/Gait assistance: Min guard;Min assist (requires min assist without device and min guard with device.  ) Ambulation Distance (Feet): 250 Feet Assistive device: Rolling walker (2 wheeled) Gait Pattern/deviations: Step-through pattern;Decreased weight shift to left Gait velocity: decreased Gait velocity interpretation: Below normal speed for age/gender General Gait Details: Cues for erect stance and posture.  Cues for weight  shifting to achieve gait symmetry.     Stairs            Wheelchair Mobility    Modified Rankin (Stroke Patients Only)       Balance Overall balance assessment: Needs assistance   Sitting balance-Leahy Scale: Fair       Standing balance-Leahy Scale: Poor Standing balance comment: LOB during high level balance activities, LOB with out device for gait training and gait activities.   Single Leg Stance - Right Leg:  (modified SLS on 6 inch stair) Single Leg Stance - Left Leg:  (modified SLS on 6 inch stair) Tandem Stance - Right Leg:  (45 sec) Tandem Stance - Left Leg:  (1 min) Rhomberg - Eyes Opened:  (1 min) Rhomberg - Eyes Closed:  (10 sec) High level balance activites: Side stepping;Backward walking;Direction changes;Turns (45ft x2 each direction.  ) High Level Balance Comments: Pt performed intermittent use of UE support during activities to instill confidence and maintain balance.      Cognition Arousal/Alertness: Awake/alert Behavior During Therapy: WFL for tasks assessed/performed Overall Cognitive Status: Within Functional Limits for tasks assessed                      Exercises      General Comments        Pertinent Vitals/Pain Pain Assessment: No/denies pain    Home Living                      Prior Function  PT Goals (current goals can now be found in the care plan section) Acute Rehab PT Goals Patient Stated Goal: to be able to walk and go home Potential to Achieve Goals: Good Progress towards PT goals: Progressing toward goals    Frequency           PT Plan Discharge plan needs to be updated    Co-evaluation             End of Session Equipment Utilized During Treatment: Gait belt Activity Tolerance: Patient tolerated treatment well Patient left: with call bell/phone within reach;with family/visitor present     Time: 3646-8032 PT Time Calculation (min) (ACUTE ONLY): 26 min  Charges:  $Gait  Training: 8-22 mins $Neuromuscular Re-education: 8-22 mins                    G Codes:      Cristela Blue 01/01/17, 4:52 PM  Governor Rooks, PTA pager [phone]

## 2016-12-04 NOTE — Clinical Social Work Note (Signed)
CSW consulted for New SNF, however pt ambulated 250 feet. Therefore pt will not qualify for SNF. CSW and RNCM spoke with pt and wife about returning home with home health services. CSW is signing off as no further needs identified.   Darden Dates, MSW, LCSW  Clinical Social Worker  [phone]

## 2016-12-04 NOTE — Care Management Important Message (Signed)
Important Message  Patient Details  Name: [name] MRN: [id number] Date of Birth: November 12, 1936   Medicare Important Message Given:  Yes    Kortnie Stovall Montine Circle 12/04/2016, 12:26 PM

## 2016-12-04 NOTE — Progress Notes (Signed)
Rehab admissions - I have received a denial from [facility] for acute inpatient rehab admission.  Per medical director, patient needs can be met at a lower level of care such as SNF or [place] therapies.  Call me for questions.  #[phone]

## 2016-12-04 NOTE — Care Management Note (Signed)
Case Management Note  Patient Details  Name: ALROY PORTELA MRN: [id number] Date of Birth: September 26, 1936  Subjective/Objective:                    Action/Plan: Pt received a denial from insurance for CIR. CM met with the patient and he was interested in going home. CM then spoke with patients wife and she does not feel she can assist him at home initially. They have decided to have him go to SNF rehab before going home. They want [facility] as their first choice. CM informed CSW. CM following.    Expected Discharge Date:                  Expected Discharge Plan:  [place]  In-House Referral:     Discharge planning Services  CM Consult  Post Acute Care Choice:    Choice offered to:     DME Arranged:    DME Agency:     HH Arranged:    [place] Agency:     Status of Service:  In process, will continue to follow  If discussed at Long Length of Stay Meetings, dates discussed:    Additional Comments:  Pollie Friar, RN 12/04/2016, 4:36 PM

## 2016-12-04 NOTE — Care Management Note (Signed)
Case Management Note  Patient Details  Name: Curtis Jimenez MRN: [id number] Date of Birth: 1936/11/08  Subjective/Objective:                    Action/Plan: Pt walked over 200 feet today so the plan is home with [facility] services. CM and CSW spoke to the patient and his wife and explained about insurance and getting approval for rehab. They are both in agreement with going home. Mrs Claw asked to use [place] for [facility] services. Pt with orders for rolling walker. Larene Beach with [facility] DME notified and delivered the equipment to the room. CM following for [place] orders.   Expected Discharge Date:                  Expected Discharge Plan:  [place]  In-House Referral:     Discharge planning Services  CM Consult  Post Acute Care Choice:  Home Health, Durable Medical Equipment Choice offered to:  Patient, Spouse  DME Arranged:  Walker rolling DME Agency:  [place] Arranged:  PT, OT [place] Agency:  [facility] (now [facility])  Status of Service:  In process, will continue to follow  If discussed at Long Length of Stay Meetings, dates discussed:    Additional Comments:  Pollie Friar, RN 12/04/2016, 5:13 PM

## 2016-12-04 NOTE — Progress Notes (Signed)
STROKE TEAM PROGRESS NOTE   SUBJECTIVE (INTERVAL HISTORY) Pt sitting in chair with wife at bedside. Pt continues to do better.  Pt was able to walk with PT more than 200 feet today. Recommend home PT/OT.    OBJECTIVE Temp:  [97.9 F (36.6 C)-98.7 F (37.1 C)] 97.9 F (36.6 C) (12/08 0610) Pulse Rate:  [66-81] 81 (12/08 0610) Cardiac Rhythm: Normal sinus rhythm (12/07 1900) Resp:  [16-18] 16 (12/08 0610) BP: (114-125)/(60-66) 125/64 (12/08 0610) SpO2:  [90 %-96 %] 90 % (12/08 0610)  CBC:  Recent Labs Lab 11/30/16 1334  12/03/16 0352 12/04/16 0425  WBC 7.5  < > 6.7 6.0  NEUTROABS 5.7  --   --   --   HGB 13.8  < > 12.1* 12.2*  HCT 42.5  < > 38.2* 37.5*  MCV 86.9  < > 86.6 85.6  PLT 216  < > 186 188  < > = values in this interval not displayed.  Basic Metabolic Panel:   Recent Labs Lab 12/03/16 0352 12/04/16 0425  NA 140 141  K 4.1 4.0  CL 105 107  CO2 25 23  GLUCOSE 104* 85  BUN 24* 22*  CREATININE 1.69* 1.60*  CALCIUM 8.7* 8.7*    Lipid Panel:     Component Value Date/Time   CHOL 120 12/01/2016 0246   TRIG 63 12/01/2016 0246   TRIG 130 12/15/2006 1045   HDL 42 12/01/2016 0246   CHOLHDL 2.9 12/01/2016 0246   VLDL 13 12/01/2016 0246   LDLCALC 65 12/01/2016 0246   HgbA1c:  Lab Results  Component Value Date   HGBA1C 5.9 (H) 12/01/2016   Urine Drug Screen: No results found for: LABOPIA, COCAINSCRNUR, LABBENZ, AMPHETMU, THCU, LABBARB    IMAGING I have personally reviewed the radiological images below and agree with the radiology interpretations.  Ct Head Code Stroke W/o Cm 11/30/2016 1. Mild atrophy and white matter disease likely reflects the sequela of chronic microvascular ischemia.  2. No acute intracranial abnormality.  3. ASPECTS is 10/10   Mri and Mra Brain Wo Contrast 12/01/2016 1. No acute intracranial abnormality identified.  2. Mild chronic microvascular ischemic changes and parenchymal volume loss of the brain.  3. Mild paranasal sinus  disease.  4. Patent circle of Willis without evidence for large vessel occlusion, aneurysm, or significant stenosis.   2-D echocardiogram - Left ventricle: The cavity size was mildly dilated. Systolic function was normal. The estimated ejection fraction was in the range of 55% to 60%. Wall motion was normal; there were no regional wall motion abnormalities. There was an increased relative contribution of atrial contraction to ventricular filling. Doppler parameters are consistent with abnormal left ventricular relaxation (grade 1 diastolic dysfunction). - Aorta: Aortic root dimension: 38 mm (ED). - Aortic root: The aortic root was mildly dilated. - Mitral valve: There was mild regurgitation.  Carotid Doppler   There is 1-39% bilateral ICA stenosis. Vertebral artery flow is antegrade.      PHYSICAL EXAM  Temp:  [97.9 F (36.6 C)-98.7 F (37.1 C)] 97.9 F (36.6 C) (12/08 0610) Pulse Rate:  [66-81] 81 (12/08 0610) Resp:  [16-18] 16 (12/08 0610) BP: (114-125)/(60-66) 125/64 (12/08 0610) SpO2:  [90 %-96 %] 90 % (12/08 0610)  General - Well nourished, well developed, in mild distress with motion related dizziness.  Ophthalmologic - Fundi not visualized due to noncooperation.  Cardiovascular - Regular rate and rhythm.  Mental Status -  Level of arousal and orientation to time, place, and person  were intact. Language including expression, naming, repetition, comprehension was assessed and found intact.  Cranial Nerves II - XII - II - Visual field intact OU. III, IV, VI - Extraocular movements intact. V - Facial sensation intact bilaterally. VII - facial symmetrical. VIII - Hearing & vestibular exam showed no more nystagmus. X - Palate elevates symmetrically. XI - Chin turning & shoulder shrug intact bilaterally. XII - Tongue protrusion  in the middle  Motor Strength - [name] strength was normal in all extremities and pronator drift was absent.  Bulk was normal and  fasciculations were absent.   Motor Tone - Muscle tone was assessed at the neck and appendages and was normal.  Reflexes - [name] reflexes were 1+ in all extremities and he had no pathological reflexes.  Sensory - Light touch, temperature/pinprick were assessed and were symmetrical.    Coordination - The patient had no more dysmetria or axiaxia and normal rapid alternating test.    Gait and Station -  walk with walker and slow cautious gait with small stride. No fall   ASSESSMENT/PLAN [name] is a 80 y.o. male with history of hypertension, chronic kidney disease with renal transplant 4 years ago presenting with vertigo and blurry vision. He received IV TPA 11/30/2016 at 1521.   DWI negative posterior stroke, likely located in left pons  Resultant  vertigo, nystagmus, mild right facial droop  MRI  No acute stroke seen  MRA  Unremarkable   Carotid Doppler  no significant stenosis  2D Echo  EF 55-60% with no source of embolus  LDL 65  HgbA1c 5.9  Lovenox 40 mg sq daily  for VTE prophylaxis Diet Heart Room service appropriate? Yes; Fluid consistency: Thin  aspirin 81 mg daily prior to admission, now on Plavix for stroke prevention  Ongoing aggressive stroke risk factor management  Therapy recommendations:  HH therapies  Disposition:  Pending  AKI on CKD s/p renal transplant  Cre 1.44-1.54-1.63-1.69 - 1.60  Continue IVF @ 75cc  Encourage po intake  Monitoring BMP daily in am (last occurrence Sunday, 12/06/2016)  Hypertension  Stable  BP goal normotensive  Hyperlipidemia  Home meds:  zocor 10 mg daily, resumed in hospital  LDL 65, goal < 70  Continue statin at discharge  Other Stroke Risk Factors  Advanced age  Overweight, Body mass index is 30.62 kg/m., recommend weight loss, diet and exercise as appropriate   Other Active Problems  Bipolar disorder  BPH  Chronic renal insufficiency stage III due to lithium toxicity  over 30 years ago, status post cadaver renal transplant, baseline Cr 1.5 post [facility] day # 4  Rosalin Hawking, MD PhD Stroke Neurology 12/04/2016 8:47 PM   To contact Stroke Continuity provider, please refer to [email or web address] After hours, contact General Neurology

## 2016-12-05 LAB — BASIC METABOLIC PANEL
Anion gap: 6 (ref 5–15)
BUN: 22 mg/dL — ABNORMAL HIGH (ref 6–20)
CALCIUM: 8.6 mg/dL — AB (ref 8.9–10.3)
CHLORIDE: 108 mmol/L (ref 101–111)
CO2: 26 mmol/L (ref 22–32)
CREATININE: 1.6 mg/dL — AB (ref 0.61–1.24)
GFR calc non Af Amer: 40 mL/min — ABNORMAL LOW (ref >60)
GFR, EST AFRICAN AMERICAN: 46 mL/min — AB (ref >60)
GLUCOSE: 105 mg/dL — AB (ref 65–99)
Potassium: 3.9 mmol/L (ref 3.5–5.1)
Sodium: 140 mmol/L (ref 135–145)

## 2016-12-05 LAB — CBC
HEMATOCRIT: 37.8 % — AB (ref 39.0–52.0)
Hemoglobin: 12.1 g/dL — ABNORMAL LOW (ref 13.0–17.0)
MCH: 27.6 pg (ref 26.0–34.0)
MCHC: 32 g/dL (ref 30.0–36.0)
MCV: 86.3 fL (ref 78.0–100.0)
Platelets: 190 10*3/uL (ref 150–400)
RBC: 4.38 MIL/uL (ref 4.22–5.81)
RDW: 13.6 % (ref 11.5–15.5)
WBC: 5.8 10*3/uL (ref 4.0–10.5)

## 2016-12-05 MED ORDER — CARVEDILOL 6.25 MG PO TABS: 6.2500 mg | ORAL_TABLET | Freq: Two times a day (BID) | ORAL | 3 refills | Status: DC

## 2016-12-05 MED ORDER — CLOPIDOGREL BISULFATE 75 MG PO TABS: 75.0000 mg | ORAL_TABLET | Freq: Every day | ORAL | 3 refills | Status: DC

## 2016-12-05 MED ORDER — CARVEDILOL 6.25 MG PO TABS
ORAL_TABLET | ORAL | 3 refills | Status: DC
Start: 2016-12-05 — End: 2016-12-05

## 2016-12-05 NOTE — Discharge Summary (Signed)
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  Past Medical History:  Diagnosis Date  . Anemia   . Arthritis   . Basal cell carcinoma    neck (skin MD 2X per year)  . Bipolar disorder ([place])   . BPH (benign prostatic hyperplasia)    with elevated PSA; followed by Dr. Rosana Hoes at [facility]  . Cataract   . Chronic renal insufficiency, stage III (moderate)    Last f/u with neph 07/06/16: Cr stable at 1.57 (GFR 42)  . Gallbladder polyp 2015   Asymptomatic  . GERD (gastroesophageal reflux disease)   . Gout   . Hyperlipidemia   . Hypertension   . Hypothyroidism   . Restless legs syndrome   . S/p cadaver renal transplant 2013   Secondary to HTN +lithium toxicity over 30 yrs caused kidney destruction [facility] transplant MDs)   Past Surgical History:  Procedure Laterality Date  . AV FISTULA PLACEMENT  04/08/2012   Procedure: ARTERIOVENOUS (AV) FISTULA CREATION;  Surgeon: Angelia Mould, MD;  Location: [facility] OR;  Service: Vascular;  Laterality: Left;  Creation of left brachial cephalic arteriovenous fistula  . COLONOSCOPY  2013   Normal except diverticulosis (recall 10 yrs)  . dilation for GERD    . KIDNEY TRANSPLANT  11/06/12   [place] (cadaveric)  . PROSTATE BIOPSY  2011   no malignancy      Medication List    STOP taking these medications   aspirin 81 MG chewable tablet     TAKE these medications   acetaminophen 325 MG tablet Commonly known as:   TYLENOL Take 650 mg by mouth every 6 (six) hours as needed for mild pain.   allopurinol 300 MG tablet Commonly known as:  ZYLOPRIM Take 1 tablet (300 mg total) by mouth daily.   amLODipine 5 MG tablet Commonly known as:  NORVASC Take 5 mg by mouth daily.   carvedilol 6.25 MG tablet Commonly known as:  COREG Take 1 tablet (6.25 mg total) by mouth 2 (two) times daily with a meal. What changed:  how much to take  when to take this  additional instructions   clopidogrel 75 MG tablet Commonly known as:  PLAVIX Take 1 tablet (75 mg total) by mouth daily.   colchicine 0.6 MG tablet 2 tabs at onset of gout flare, then 1 tab one hour later, then starting the next day take 1 tab bid until gout flare is resolved   hydrOXYzine 25 MG tablet Commonly known as:  ATARAX/VISTARIL Take 25 mg by mouth 3 (three) times daily.   levothyroxine 25 MCG tablet Commonly known as:  SYNTHROID, LEVOTHROID Take 1 tablet (25 mcg total) by mouth daily.   mycophenolate 180 MG EC tablet Commonly known as:  MYFORTIC Take 360 mg by mouth 2 (two) times daily.   Omega-3 1000 MG Caps Take 1,000 mg by mouth 2 (two) times daily. Take 2 g by mouth 2 (two)  times daily as needed.   omeprazole 20 MG capsule Commonly known as:  PRILOSEC Take 1 capsule (20 mg total) by mouth daily.   risperiDONE 2 MG tablet Commonly known as:  RISPERDAL Take 1 mg by mouth daily. Take 1/2 tablet daily.   simvastatin 40 MG tablet Commonly known as:  ZOCOR 1/2 tab po qd What changed:  how much to take  how to take this  when to take this  additional instructions   sulfamethoxazole-trimethoprim 400-80 MG tablet Commonly known as:  BACTRIM,SEPTRA Take 1 tablet by mouth every Monday, Wednesday, and Friday.   tacrolimus 1 MG capsule Commonly known as:  PROGRAF Take 0.5-1 mg by mouth See admin instructions. 1 mg in am, 0.5mg  in pm   vitamin B-12 100 MCG tablet Commonly known as:  CYANOCOBALAMIN Take 100 mcg by  mouth daily.   Vitamin D 2000 units Caps Take 1 capsule by mouth daily.            Durable Medical Equipment        Start     Ordered   12/04/16 1221  For home use only DME Walker rolling  Once    Question:  Patient needs a walker to treat with the following condition  Answer:  Weakness   12/04/16 1221      LABORATORY STUDIES CBC    Component Value Date/Time   WBC 5.8 12/05/2016 0139   RBC 4.38 12/05/2016 0139   HGB 12.1 (L) 12/05/2016 0139   HCT 37.8 (L) 12/05/2016 0139   PLT 190 12/05/2016 0139   MCV 86.3 12/05/2016 0139   MCH 27.6 12/05/2016 0139   MCHC 32.0 12/05/2016 0139   RDW 13.6 12/05/2016 0139   LYMPHSABS 0.7 11/30/2016 1334   MONOABS 0.6 11/30/2016 1334   EOSABS 0.2 11/30/2016 1334   BASOSABS 0.0 11/30/2016 1334   CMP    Component Value Date/Time   NA 140 12/05/2016 0139   NA 141 12/14/2011   K 3.9 12/05/2016 0139   CL 108 12/05/2016 0139   CO2 26 12/05/2016 0139   GLUCOSE 105 (H) 12/05/2016 0139   GLUCOSE 103 (H) 12/15/2006 1045   BUN 22 (H) 12/05/2016 0139   BUN 35 (A) 12/14/2011   CREATININE 1.60 (H) 12/05/2016 0139   CREATININE 1.84 (H) 07/23/2016 0930   CALCIUM 8.6 (L) 12/05/2016 0139   CALCIUM 9.5 12/06/2010 0046   PROT 6.4 (L) 11/30/2016 1334   ALBUMIN 3.8 11/30/2016 1334   AST 24 11/30/2016 1334   ALT 20 11/30/2016 1334   ALKPHOS 53 11/30/2016 1334   BILITOT 0.9 11/30/2016 1334   GFRNONAA 40 (L) 12/05/2016 0139   GFRAA 46 (L) 12/05/2016 0139   COAGS Lab Results  Component Value Date   INR 1.06 11/30/2016   Lipid Panel    Component Value Date/Time   CHOL 120 12/01/2016 0246   TRIG 63 12/01/2016 0246   TRIG 130 12/15/2006 1045   HDL 42 12/01/2016 0246   CHOLHDL 2.9 12/01/2016 0246   VLDL 13 12/01/2016 0246   LDLCALC 65 12/01/2016 0246   HgbA1C  Lab Results  Component Value Date   HGBA1C 5.9 (H) 12/01/2016   Cardiac Panel (last 3 results) No results for input(s): CKTOTAL, CKMB, TROPONINI, RELINDX in the last 72  hours. Urinalysis    Component Value Date/Time   COLORURINE YELLOW 12/01/2016 [place] 12/01/2016 0520   LABSPEC 1.012 12/01/2016 0520   PHURINE 6.5 12/01/2016 0520   GLUCOSEU NEGATIVE 12/01/2016 0520  HGBUR NEGATIVE 12/01/2016 0520   BILIRUBINUR NEGATIVE 12/01/2016 0520   BILIRUBINUR neg 04/04/2014 0918   KETONESUR NEGATIVE 12/01/2016 0520   PROTEINUR NEGATIVE 12/01/2016 0520   UROBILINOGEN 0.2 04/04/2014 0918   NITRITE NEGATIVE 12/01/2016 0520   LEUKOCYTESUR NEGATIVE 12/01/2016 0520   Urine Drug Screen No results found for: LABOPIA, COCAINSCRNUR, LABBENZ, AMPHETMU, THCU, LABBARB  Alcohol Level    Component Value Date/Time   ETH <5 11/30/2016 1253     SIGNIFICANT DIAGNOSTIC STUDIES I have personally reviewed the radiological images below and agree with the radiology interpretations.  CT Head Code Stroke W/o Cm 11/30/2016 1. Mild atrophy and white matter disease likely reflects the sequela of chronic microvascular ischemia.  2. No acute intracranial abnormality.  3. ASPECTS is 10/10   Mri and Mra Brain Wo Contrast 12/01/2016 1. No acute intracranial abnormality identified.  2. Mild chronic microvascular ischemic changes and parenchymal volume loss of the brain.  3. Mild paranasal sinus disease.  4. Patent circle of Willis without evidence for large vessel occlusion, aneurysm, or significant stenosis.   2-D echocardiogram - Left ventricle: The cavity size was mildly dilated. Systolicfunction was normal. The estimated ejection fraction was in therange of 55% to 60%. Wall motion was normal; there were noregional wall motion abnormalities. There was an increasedrelative contribution of atrial contraction to ventricularfilling. Doppler parameters are consistent with abnormal leftventricular relaxation (grade 1 diastolic dysfunction). - Aorta: Aortic root dimension: 38 mm (ED). - Aortic root: The aortic root was mildly dilated. - Mitral valve: There was  mild regurgitation.  Carotid Doppler   There is 1-39% bilateral ICA stenosis. Vertebral artery flow is antegrade.      HISTORY OF PRESENT ILLNESS NASIRE REALI is a 80 y.o. male with hx of HTN, CKD, renal transplant 4 years ago at [facility] presented 11/30/2016 as a code stroke due to vertigo and blurry vision. He was sitting in chair at 12:30pm when he stood up, suddenly he felt room spinning and felt upside down. He had to sit down but then developed nausea and vomiting with blurry vision and double vision. He denied any weakness or numbness, LOC or swallowing difficulty. EMS was called, and he was given zofran en route to [facility] ED. He had a CT head that showed no acute abnormalities. He was transferred to [facility] for code stroke. On arrival, pt still had nausea and later vomited. Exam showed left nystagmus, left arm mild ataxia and right mild facial droop concerning for small brainstem infarct. tPA was given and the pt was admitted to the ICU.  LSN: 12:30pm tPA Given: Yes   HOSPITAL COURSE Mr. KAELUM KISSICK is a 80 y.o. male with history of hypertension, chronic kidney disease with renal transplant 4 years ago, hypothyroidism, hyperlipidemia, gout, gastroesophageal reflux disease, renal insufficiency, bipolar disorder, anemia, and arthritis presenting with vertigo and blurry vision. He received IV TPA 11/30/2016 at 1521. He was admitted to neuro ICU after tPA and repeat MRI showed no acute stroke but was suspected with DWI negative infarct at posterior circulation. His symptoms gradually improved and PT/OT assessment felt he can go home with home therapy. His Cre was elevated to 1.69 from baseline 1.50. Gave IVF and encourage po intake, on discharge Cre 1.60. He needs to follow up with PCP for continued Cre monitoring.   DWI negative posterior stroke, likely located in left pons  Resultant  vertigo, nystagmus, mild right facial droop  MRI  No acute stroke seen  MRA  Unremarkable  Carotid  Doppler  no significant stenosis  2D Echo  EF 55-60% with no source of embolus  LDL 65  HgbA1c 5.9  Lovenox 40 mg sq daily  for VTE prophylaxis  Diet Heart Room service appropriate? Yes; Fluid consistency: Thin  aspirin 81 mg daily prior to admission, now on Plavix for stroke prevention  Ongoing aggressive stroke risk factor management  Therapy recommendations:  HH therapies recommended  Disposition:   discharge to home today  AKI on CKD s/p renal transplant  Cre 1.44-1.54-1.63-1.69 - 1.60  Discontinue IV fluids at time of discharge.  Encourage po intake  Continue to monitor renal function on an outpatient basis.  Hypertension  Stable   BP goal normotensive  Resume home BP meds  Hyperlipidemia  Home meds:  zocor 10 mg daily, resumed in hospital  LDL 65, goal < 70  Continue statin at discharge  Other Stroke Risk Factors  Advanced age  Overweight, Body mass index is 30.62 kg/m., recommend weight loss, diet and exercise as appropriate   Other Active Problems  Bipolar disorder - home medications resumed  BPH  Chronic renal insufficiency stage III due to lithium toxicity over 30 years ago, status post cadaver renal transplant, baseline Cr 1.5 post xplant  DISCHARGE EXAM Blood pressure (!) 124/57, pulse 86, temperature 97.9 F (36.6 C), temperature source Oral, resp. rate 18, height 5\' 10"  (1.778 m), weight 213 lb 6.5 oz (96.8 kg), SpO2 94 %.  General- Well nourished, well developed, in mild distress with motion related dizziness.  Ophthalmologic- Fundi not visualized due to noncooperation.  Cardiovascular - Regular rate and rhythm.  Mental Status-  Level of arousal and orientation to time, place, and person were intact. Language including expression, naming, repetition, comprehension was assessed and found intact.  Cranial Nerves II - XII- II - Visual fieldintact OU. III, IV, VI - Extraocular movements intact. V - Facial sensation  intact bilaterally. VII - facial symmetrical. VIII - Hearing &vestibular exam showed no more nystagmus. X - Palate elevates symmetrically. XI - Chin turning & shoulder shrug intact bilaterally. XII - Tongue protrusion in the middle  Motor Strength -[name] strength was normal in all extremities and pronator drift was absent. Bulk was normal and fasciculations were absent.  Motor Tone- Muscle tone was assessed at the neck and appendages and was normal.  Reflexes-[name] reflexes were 1+in all extremities and hehad no pathological reflexes.  Sensory-Light touch, temperature/pinprickwere assessed and were symmetrical.   Coordination-The patient had no more dysmetria or axiaxia and normal rapid alternating test.   Gait and Station- walk with walker and slow cautious gait with small stride. No fall   Discharge Diet   Diet Heart Room service appropriate? Yes; Fluid consistency: Thin liquids  DISCHARGE PLAN  Disposition:  Discharge to home  clopidogrel 75 mg daily for secondary stroke prevention.  Ongoing risk factor control by Primary Care Physician at time of discharge  Follow-up MCGOWEN,PHILIP H, MD in 3 - 4 weeks.  Follow-up with Dr. Rosalin Hawking, in 6 weeks - office to schedule an appointment.  Home health physical and occupational therapies as recommended.  35 minutes were spent preparing discharge.  Rosalin Hawking, MD PhD Stroke Neurology 12/05/2016 12:54 PM

## 2016-12-05 NOTE — Progress Notes (Signed)
Despite CM previous note, pt was NOT setup with [facility] states they cannot accept pt referral.  CM called the pt and pt accepts [facility] to render HHPT/OT.  Referral given to [facility] rep, Jermaine.  No other CM needs were communicated.

## 2016-12-05 NOTE — Progress Notes (Signed)
Physical Therapy Treatment Patient Details Name: Curtis Jimenez MRN: [id number] DOB: May 28, 1936 Today's Date: 12/05/2016    History of Present Illness Patient is a 80 y/o male with hx of HTN, HLD, bipolar disorder, s/p cadaver renal transplant, gout and chronic renal insufficiency presents as code stroke due to vertigo and blurry vision. Head CT-unremarkable. Exam showed left nystagmus, left arm mild ataxia and right mild facial droop concerning for small brainstem infarct. tPA given. MRI (-).    PT Comments    Pt progressing well with mobility. D/C recommendation update to HHPT with 24-hour family assist. Pt's wife will be able to provide needed level of care. She was present during today's session. She verbalizes understanding of pt's mobility needs.   Follow Up Recommendations  Home health PT;Supervision/Assistance - 24 hour     Equipment Recommendations  Rolling walker with 5" wheels;3in1 (PT)    Recommendations for Other Services       Precautions / Restrictions Precautions Precautions: Fall    Mobility  Bed Mobility         Supine to sit: Min guard Sit to supine: Min guard   General bed mobility comments: min guard for safety only  Transfers   Equipment used: Rolling walker (2 wheeled) Transfers: Stand Pivot Transfers Sit to Stand: Min guard Stand pivot transfers: Min guard       General transfer comment: verbal cues for hand placement  Ambulation/Gait Ambulation/Gait assistance: Min guard Ambulation Distance (Feet): 250 Feet Assistive device: Rolling walker (2 wheeled) Gait Pattern/deviations: Step-through pattern;Decreased weight shift to left Gait velocity: mildly decreased Gait velocity interpretation: Below normal speed for age/gender General Gait Details: Verbal cues for posture   Stairs            Wheelchair Mobility    Modified Rankin (Stroke Patients Only) Modified Rankin (Stroke Patients Only) Pre-Morbid Rankin Score: No  symptoms Modified Rankin: Moderate disability     Balance   Sitting-balance support: Feet supported;No upper extremity supported Sitting balance-Leahy Scale: Good     Standing balance support: During functional activity;Bilateral upper extremity supported Standing balance-Leahy Scale: Fair                      Cognition Arousal/Alertness: Awake/alert Behavior During Therapy: WFL for tasks assessed/performed Overall Cognitive Status: Within Functional Limits for tasks assessed                      Exercises      General Comments        Pertinent Vitals/Pain Pain Assessment: No/denies pain    Home Living                      Prior Function            PT Goals (current goals can now be found in the care plan section) Acute Rehab PT Goals Patient Stated Goal: to be able to walk and go home PT Goal Formulation: With patient Time For Goal Achievement: 12/15/16 Potential to Achieve Goals: Good Progress towards PT goals: Progressing toward goals    Frequency    Min 4X/week      PT Plan Discharge plan needs to be updated    Co-evaluation             End of Session Equipment Utilized During Treatment: Gait belt Activity Tolerance: Patient tolerated treatment well Patient left: in chair;with call bell/phone within reach;with family/visitor present     Time:  3685-9923 PT Time Calculation (min) (ACUTE ONLY): 29 min  Charges:  $Gait Training: 23-37 mins                    G Codes:      Lorriane Shire 12/05/2016, 10:32 AM

## 2016-12-05 NOTE — Progress Notes (Signed)
Patient ready for discharge to home;discharge instructions given and reviewed with patient and his wife;Rx's sent electronically; patient ready for discharge to home; discharged out via wheelchair; accompanied by his wife.

## 2016-12-07 ENCOUNTER — Telehealth: Payer: Self-pay | Admitting: Family Medicine

## 2016-12-07 ENCOUNTER — Telehealth: Payer: Self-pay

## 2016-12-07 DIAGNOSIS — Z7901 Long term (current) use of anticoagulants: Secondary | ICD-10-CM | POA: Diagnosis not present

## 2016-12-07 DIAGNOSIS — I69393 Ataxia following cerebral infarction: Secondary | ICD-10-CM | POA: Diagnosis not present

## 2016-12-07 DIAGNOSIS — N183 Chronic kidney disease, stage 3 (moderate): Secondary | ICD-10-CM | POA: Diagnosis not present

## 2016-12-07 DIAGNOSIS — Z87891 Personal history of nicotine dependence: Secondary | ICD-10-CM | POA: Diagnosis not present

## 2016-12-07 DIAGNOSIS — R69 Illness, unspecified: Secondary | ICD-10-CM | POA: Diagnosis not present

## 2016-12-07 DIAGNOSIS — E785 Hyperlipidemia, unspecified: Secondary | ICD-10-CM | POA: Diagnosis not present

## 2016-12-07 DIAGNOSIS — I69392 Facial weakness following cerebral infarction: Secondary | ICD-10-CM | POA: Diagnosis not present

## 2016-12-07 DIAGNOSIS — D631 Anemia in chronic kidney disease: Secondary | ICD-10-CM | POA: Diagnosis not present

## 2016-12-07 DIAGNOSIS — I129 Hypertensive chronic kidney disease with stage 1 through stage 4 chronic kidney disease, or unspecified chronic kidney disease: Secondary | ICD-10-CM | POA: Diagnosis not present

## 2016-12-07 DIAGNOSIS — Z940 Kidney transplant status: Secondary | ICD-10-CM | POA: Diagnosis not present

## 2016-12-07 NOTE — Telephone Encounter (Signed)
Spoke with patients spouse, Arbutus Leas with patient present.   Transition Care Management Follow-up Telephone Call   Date discharged? 12/05/16   How have you been since you were released from the hospital? "basically ok. He's using a walker for equilibrium problems. He's been really tired"   Do you understand why you were in the hospital? yes   Do you understand the discharge instructions? yes   Where were you discharged to? Home   Items Reviewed:  Medications reviewed: yes, added plavix, d/c'd ASA.   Allergies reviewed: yes  Dietary changes reviewed: yes, heart healthy diet reviewed.   Referrals reviewed: no   Functional Questionnaire:   Activities of Daily Living (ADLs):   He states they are independent in the following: Needs assistance with ADL's currently States they require assistance with the following: ambulation, bathing and hygiene, feeding, continence, grooming, toileting and dressing    Physical Therapy scheduled for home evaluation today.    Any transportation issues/concerns?: no   Any patient concerns? no   Confirmed importance and date/time of follow-up visits scheduled yes  Provider Appointment booked with PCP 12/10/16 @ 0900  Confirmed with patient if condition begins to worsen call PCP or go to the ER.  Patient was given the office number and encouraged to call back with question or concerns.  : yes

## 2016-12-07 NOTE — Telephone Encounter (Signed)
Patient calling to report he was discharged from hospital on yesterday due to stroke.  He states she was discharged on a blood thinner.  However, he has questions about this medication as it is not the medication he thought the doctor at the hospital said he would be on.  He wants to make sure is taking the appropriate medication.

## 2016-12-07 NOTE — Telephone Encounter (Signed)
Noted  

## 2016-12-07 NOTE — Telephone Encounter (Signed)
Spoke to pt, he stated that at discharge he was told he would be started on Plavix. I advised pt that the Rx he received clopidogrel is the generic for Plavix. Pt voiced understanding. Pt has apt with Dr. Anitra Lauth on 12/10/16 at 9:00am for [place].

## 2016-12-09 DIAGNOSIS — I129 Hypertensive chronic kidney disease with stage 1 through stage 4 chronic kidney disease, or unspecified chronic kidney disease: Secondary | ICD-10-CM | POA: Diagnosis not present

## 2016-12-09 DIAGNOSIS — N183 Chronic kidney disease, stage 3 (moderate): Secondary | ICD-10-CM | POA: Diagnosis not present

## 2016-12-09 DIAGNOSIS — D631 Anemia in chronic kidney disease: Secondary | ICD-10-CM | POA: Diagnosis not present

## 2016-12-09 DIAGNOSIS — Z940 Kidney transplant status: Secondary | ICD-10-CM | POA: Diagnosis not present

## 2016-12-09 DIAGNOSIS — E785 Hyperlipidemia, unspecified: Secondary | ICD-10-CM | POA: Diagnosis not present

## 2016-12-09 DIAGNOSIS — Z7901 Long term (current) use of anticoagulants: Secondary | ICD-10-CM | POA: Diagnosis not present

## 2016-12-09 DIAGNOSIS — R69 Illness, unspecified: Secondary | ICD-10-CM | POA: Diagnosis not present

## 2016-12-09 DIAGNOSIS — Z87891 Personal history of nicotine dependence: Secondary | ICD-10-CM | POA: Diagnosis not present

## 2016-12-09 DIAGNOSIS — I69393 Ataxia following cerebral infarction: Secondary | ICD-10-CM | POA: Diagnosis not present

## 2016-12-09 DIAGNOSIS — I69392 Facial weakness following cerebral infarction: Secondary | ICD-10-CM | POA: Diagnosis not present

## 2016-12-10 ENCOUNTER — Ambulatory Visit (INDEPENDENT_AMBULATORY_CARE_PROVIDER_SITE_OTHER): Payer: Medicare HMO | Admitting: Family Medicine

## 2016-12-10 ENCOUNTER — Encounter: Payer: Self-pay | Admitting: Family Medicine

## 2016-12-10 ENCOUNTER — Telehealth: Payer: Self-pay | Admitting: Family Medicine

## 2016-12-10 VITALS — BP 125/67 | HR 77 | Temp 98.1°F | Resp 16 | Ht 69.5 in | Wt 208.5 lb

## 2016-12-10 DIAGNOSIS — R2231 Localized swelling, mass and lump, right upper limb: Secondary | ICD-10-CM

## 2016-12-10 DIAGNOSIS — R42 Dizziness and giddiness: Secondary | ICD-10-CM | POA: Diagnosis not present

## 2016-12-10 DIAGNOSIS — I69398 Other sequelae of cerebral infarction: Secondary | ICD-10-CM

## 2016-12-10 DIAGNOSIS — H8190 Unspecified disorder of vestibular function, unspecified ear: Secondary | ICD-10-CM

## 2016-12-10 DIAGNOSIS — I6350 Cerebral infarction due to unspecified occlusion or stenosis of unspecified cerebral artery: Secondary | ICD-10-CM

## 2016-12-10 MED ORDER — CLOPIDOGREL BISULFATE 75 MG PO TABS: 75.0000 mg | ORAL_TABLET | Freq: Every day | ORAL | 3 refills | Status: DC

## 2016-12-10 NOTE — Progress Notes (Addendum)
12/10/2016  CC:  Chief Complaint  Patient presents with  . Hospitalization Follow-up    TCM    Patient is a 80 y.o. Caucasian male who presents for  hospital follow up, specifically [facility] face-to-face visit. Dates hospitalized: 12/4-12/9, 2017. Days since d/c from hospital: 5 Patient was discharged from hospital to home. 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 Date of interactive (phone) contact with patient and/or caregiver: 12/07/16.  I have reviewed patient's discharge summary plus pertinent specific notes, labs, and imaging from the hospitalization.   In addition to above dx, he had R hand IV infiltration that was initially very swollen per pt, has gone down considerably, hurts still to squeeze hand shut/make a fist.  Says no redness or warmth.    Still feeling some disequilibrium when he turns his head but even this is improved.  No more double vision. He walks with a walker.  He feels no facial weakness.  He is getting PT and OT at home.  ST was not deemed necessary.  Has been eating/drinking well since d/c home.  Medication reconciliation was done today and patient is taking meds as recommended by discharging hospitalist/specialist.    PMH:  Past Medical History:  Diagnosis Date  . Anemia   . Arthritis   . Basal cell carcinoma    neck (skin MD 2X per year)  . Bipolar disorder ([place])   . BPH (benign prostatic hyperplasia)    with elevated PSA; followed by Dr. Rosana Hoes at [facility]  . Cataract   . Chronic renal insufficiency, stage III (moderate)    Last f/u with neph 07/06/16: Cr stable at 1.57 (GFR 42)  . Gallbladder polyp 2015   Asymptomatic  . GERD (gastroesophageal reflux disease)   . Gout   . Hyperlipidemia   . Hypertension   . Hypothyroidism   . Restless legs syndrome   . 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 transplant MDs)    PSH:  Past Surgical History:  Procedure Laterality Date  . AV FISTULA PLACEMENT  04/08/2012   Procedure: ARTERIOVENOUS (AV) FISTULA CREATION;  Surgeon: Angelia Mould, MD;  Location: [facility] OR;  Service: Vascular;  Laterality: Left;  Creation of left brachial cephalic arteriovenous fistula  . COLONOSCOPY  2013   Normal except diverticulosis (recall 10 yrs)  . dilation for GERD    . KIDNEY TRANSPLANT  11/06/12   [place] (cadaveric)  . PROSTATE BIOPSY  2011   no malignancy    MEDS:  Outpatient Medications Prior to Visit  Medication Sig Dispense Refill  . acetaminophen (TYLENOL) 325 MG tablet Take 650 mg by mouth every 6 (six) hours as needed for mild pain.    Marland Kitchen allopurinol (ZYLOPRIM) 300 MG tablet Take 1 tablet (300 mg total) by mouth daily. 90 tablet 3  . amLODipine (NORVASC) 5 MG tablet Take 5 mg by mouth daily.     . carvedilol (COREG) 6.25 MG tablet Take 1 tablet (6.25 mg total) by mouth 2 (two) times daily with a meal. 180 tablet 3  . Cholecalciferol (VITAMIN D) 2000 UNITS CAPS Take 1 capsule by mouth daily.    . clopidogrel (PLAVIX) 75 MG tablet Take 1 tablet (75 mg total) by mouth daily. 30 tablet 3  . colchicine 0.6 MG tablet 2 tabs at onset of gout flare, then  1 tab one hour later, then starting the next day take 1 tab bid until gout flare is resolved 20 tablet 1  . hydrOXYzine (ATARAX/VISTARIL) 25 MG tablet Take 25 mg by mouth 3 (three) times daily.     Marland Kitchen levothyroxine (SYNTHROID, LEVOTHROID) 25 MCG tablet Take 1 tablet (25 mcg total) by mouth daily. 90 tablet 3  . mycophenolate (MYFORTIC) 180 MG EC tablet Take 360 mg by mouth 2 (two) times daily.     . Omega-3 1000 MG CAPS Take 1,000 mg by mouth 2 (two) times daily. Take 2 g by mouth 2 (two) times daily as needed.    Marland Kitchen omeprazole (PRILOSEC) 20 MG capsule Take 1 capsule (20 mg total) by mouth daily. 90 capsule 3  . risperiDONE (RISPERDAL) 2  MG tablet Take 1 mg by mouth daily. Take 1/2 tablet daily.     . simvastatin (ZOCOR) 40 MG tablet 1/2 tab po qd (Patient taking differently: Take 10 mg by mouth daily. ) 45 tablet 3  . sulfamethoxazole-trimethoprim (BACTRIM,SEPTRA) 400-80 MG tablet Take 1 tablet by mouth every Monday, Wednesday, and Friday.    . tacrolimus (PROGRAF) 1 MG capsule Take 0.5-1 mg by mouth See admin instructions. 1 mg in am, 0.5mg  in pm    . vitamin B-12 (CYANOCOBALAMIN) 100 MCG tablet Take 100 mcg by mouth daily.     No facility-administered medications prior to visit.    EXAM: Gen: Alert, well appearing.  Patient is oriented to person, place, time, and situation. AFFECT: pleasant, lucid thought and speech. OAC:ZYSA: no injection, icteris, swelling, or exudate.  EOMI, PERRLA. Mouth: lips without lesion/swelling.  Oral mucosa pink and moist. Oropharynx without erythema, exudate, or swelling.  Tongue protrusion is predominantly midline, with possible slight deviation to the R. EOMI, no nystagmus.  He has a trace of double vision when he follows my finger. Facial features symmetric upper and lower.  Normal head turning and shoulder shrug. Neuro: strength 5/5 in proximal and distal upper extremities and lower extremities bilaterally.   No tremor.  FNF w/R hand normal, but he has mild difficulty with FNF with L hand.  Heel-chin-ankle normal bilat.  No pronator drift. He is unstable when walking, requires walker. CV: RRR, no m/r/g.   LUNGS: CTA bilat, nonlabored resps, good aeration in all lung fields. Right hand dorsal aspect + proximal phalanxes on this hand with mild swelling and minimal discomfort with palpation.  No signif warmth.  Mild pinkish discoloration of top of hand but no erythema and no streaking.  Skin is intact.   Pertinent labs/imaging   Chemistry      Component Value Date/Time   NA 140 12/05/2016 0139   NA 141 12/14/2011   K 3.9 12/05/2016 0139   CL 108 12/05/2016 0139   CO2 26 12/05/2016 0139    BUN 22 (H) 12/05/2016 0139   BUN 35 (A) 12/14/2011   CREATININE 1.60 (H) 12/05/2016 0139   CREATININE 1.84 (H) 07/23/2016 0930   GLU 120 12/14/2011      Component Value Date/Time   CALCIUM 8.6 (L) 12/05/2016 0139   CALCIUM 9.5 12/06/2010 0046   ALKPHOS 53 11/30/2016 1334   AST 24 11/30/2016 1334   ALT 20 11/30/2016 1334   BILITOT 0.9 11/30/2016 1334     MRI/MRA head w/out contrast, 12/01/16:  IMPRESSION: 1. No acute intracranial abnormality identified. 2. Mild chronic microvascular ischemic changes and parenchymal volume loss of the brain. 3. Mild paranasal sinus disease. 4. Patent circle of Willis without evidence  for large vessel occlusion, aneurysm, or significant stenosis.  ASSESSMENT/PLAN:  1) Acute pontine CVA: he is s/p TPA in hosp, was switched from ASA to plavix 75mg .  All other treatments remained the same.  He is starting home PT/OT.  Hospital neurologist not in pt's insurance network, so he requested referral to neurologist in network, which I ordered today.  He is already showing improvement regarding his deficits (vertigo/disequilibrium).  No labs or imaging necessary today.    2) R hand swelling: this is from IV infiltration while in hospital.  No sign of infection. Recommended he apply cold compress 20 min bid, should gradually resolve. Signs/symptoms to call or return for were reviewed and pt expressed understanding.  An After Visit Summary was printed and given to the patient.  Medical decision making of moderate complexity utilized today.   An After Visit Summary was printed and given to the patient.  FOLLOW UP:  Keep appt set for 01/2017.  Signed:  Crissie Sickles, MD           12/10/2016

## 2016-12-10 NOTE — Telephone Encounter (Signed)
Per Dr. Anitra Lauth okay to continue OT.

## 2016-12-10 NOTE — Telephone Encounter (Signed)
Rosann Auerbach, therapist with [facility] calling to request verbal orders to continue OT.

## 2016-12-10 NOTE — Progress Notes (Signed)
Pre visit review using our clinic review tool, if applicable. No additional management support is needed unless otherwise documented below in the visit note. 

## 2016-12-11 ENCOUNTER — Telehealth: Payer: Self-pay | Admitting: *Deleted

## 2016-12-11 DIAGNOSIS — I69393 Ataxia following cerebral infarction: Secondary | ICD-10-CM | POA: Diagnosis not present

## 2016-12-11 DIAGNOSIS — R69 Illness, unspecified: Secondary | ICD-10-CM | POA: Diagnosis not present

## 2016-12-11 DIAGNOSIS — Z7901 Long term (current) use of anticoagulants: Secondary | ICD-10-CM | POA: Diagnosis not present

## 2016-12-11 DIAGNOSIS — N183 Chronic kidney disease, stage 3 (moderate): Secondary | ICD-10-CM | POA: Diagnosis not present

## 2016-12-11 DIAGNOSIS — E785 Hyperlipidemia, unspecified: Secondary | ICD-10-CM | POA: Diagnosis not present

## 2016-12-11 DIAGNOSIS — Z87891 Personal history of nicotine dependence: Secondary | ICD-10-CM | POA: Diagnosis not present

## 2016-12-11 DIAGNOSIS — Z940 Kidney transplant status: Secondary | ICD-10-CM | POA: Diagnosis not present

## 2016-12-11 DIAGNOSIS — D631 Anemia in chronic kidney disease: Secondary | ICD-10-CM | POA: Diagnosis not present

## 2016-12-11 DIAGNOSIS — I69392 Facial weakness following cerebral infarction: Secondary | ICD-10-CM | POA: Diagnosis not present

## 2016-12-11 DIAGNOSIS — I129 Hypertensive chronic kidney disease with stage 1 through stage 4 chronic kidney disease, or unspecified chronic kidney disease: Secondary | ICD-10-CM | POA: Diagnosis not present

## 2016-12-11 NOTE — Telephone Encounter (Signed)
Noted  

## 2016-12-11 NOTE — Telephone Encounter (Signed)
Curtis Jimenez a PT with [facility] on 12/11/16 at 10:53am stating that pt fell last night (12/10/16), no injury's, pt was taking his shirt off while standing and lost his balance. Curtis Jimenez stated that they have a plan to work with pt to prevent further falls.

## 2016-12-14 DIAGNOSIS — Z7901 Long term (current) use of anticoagulants: Secondary | ICD-10-CM | POA: Diagnosis not present

## 2016-12-14 DIAGNOSIS — Z87891 Personal history of nicotine dependence: Secondary | ICD-10-CM | POA: Diagnosis not present

## 2016-12-14 DIAGNOSIS — Z940 Kidney transplant status: Secondary | ICD-10-CM | POA: Diagnosis not present

## 2016-12-14 DIAGNOSIS — R69 Illness, unspecified: Secondary | ICD-10-CM | POA: Diagnosis not present

## 2016-12-14 DIAGNOSIS — I69393 Ataxia following cerebral infarction: Secondary | ICD-10-CM | POA: Diagnosis not present

## 2016-12-14 DIAGNOSIS — I129 Hypertensive chronic kidney disease with stage 1 through stage 4 chronic kidney disease, or unspecified chronic kidney disease: Secondary | ICD-10-CM | POA: Diagnosis not present

## 2016-12-14 DIAGNOSIS — I69392 Facial weakness following cerebral infarction: Secondary | ICD-10-CM | POA: Diagnosis not present

## 2016-12-14 DIAGNOSIS — D631 Anemia in chronic kidney disease: Secondary | ICD-10-CM | POA: Diagnosis not present

## 2016-12-14 DIAGNOSIS — E785 Hyperlipidemia, unspecified: Secondary | ICD-10-CM | POA: Diagnosis not present

## 2016-12-14 DIAGNOSIS — N183 Chronic kidney disease, stage 3 (moderate): Secondary | ICD-10-CM | POA: Diagnosis not present

## 2016-12-16 ENCOUNTER — Telehealth: Payer: Self-pay | Admitting: Family Medicine

## 2016-12-16 DIAGNOSIS — E785 Hyperlipidemia, unspecified: Secondary | ICD-10-CM | POA: Diagnosis not present

## 2016-12-16 DIAGNOSIS — Z7901 Long term (current) use of anticoagulants: Secondary | ICD-10-CM | POA: Diagnosis not present

## 2016-12-16 DIAGNOSIS — D631 Anemia in chronic kidney disease: Secondary | ICD-10-CM | POA: Diagnosis not present

## 2016-12-16 DIAGNOSIS — Z940 Kidney transplant status: Secondary | ICD-10-CM | POA: Diagnosis not present

## 2016-12-16 DIAGNOSIS — I69392 Facial weakness following cerebral infarction: Secondary | ICD-10-CM | POA: Diagnosis not present

## 2016-12-16 DIAGNOSIS — I69393 Ataxia following cerebral infarction: Secondary | ICD-10-CM | POA: Diagnosis not present

## 2016-12-16 DIAGNOSIS — I129 Hypertensive chronic kidney disease with stage 1 through stage 4 chronic kidney disease, or unspecified chronic kidney disease: Secondary | ICD-10-CM | POA: Diagnosis not present

## 2016-12-16 DIAGNOSIS — Z87891 Personal history of nicotine dependence: Secondary | ICD-10-CM | POA: Diagnosis not present

## 2016-12-16 DIAGNOSIS — N183 Chronic kidney disease, stage 3 (moderate): Secondary | ICD-10-CM | POA: Diagnosis not present

## 2016-12-16 DIAGNOSIS — R69 Illness, unspecified: Secondary | ICD-10-CM | POA: Diagnosis not present

## 2016-12-16 NOTE — Telephone Encounter (Signed)
Stop amlodipine 5mg . Continue to monitor bp at home once daily. Call if consistently >150 over 95.

## 2016-12-16 NOTE — Telephone Encounter (Signed)
Pt advised and voiced understanding.   

## 2016-12-16 NOTE — Telephone Encounter (Signed)
Please advise. Thanks.  

## 2016-12-16 NOTE — Telephone Encounter (Signed)
Patient bp reading was too low at an appointment he had at the [place] office (93/50). Patient would like to know what adjustment he needs to make to his bp med. Please call.

## 2016-12-17 DIAGNOSIS — I69393 Ataxia following cerebral infarction: Secondary | ICD-10-CM | POA: Diagnosis not present

## 2016-12-17 DIAGNOSIS — Z7901 Long term (current) use of anticoagulants: Secondary | ICD-10-CM | POA: Diagnosis not present

## 2016-12-17 DIAGNOSIS — I69392 Facial weakness following cerebral infarction: Secondary | ICD-10-CM | POA: Diagnosis not present

## 2016-12-17 DIAGNOSIS — R69 Illness, unspecified: Secondary | ICD-10-CM | POA: Diagnosis not present

## 2016-12-17 DIAGNOSIS — Z940 Kidney transplant status: Secondary | ICD-10-CM | POA: Diagnosis not present

## 2016-12-17 DIAGNOSIS — I129 Hypertensive chronic kidney disease with stage 1 through stage 4 chronic kidney disease, or unspecified chronic kidney disease: Secondary | ICD-10-CM | POA: Diagnosis not present

## 2016-12-17 DIAGNOSIS — D631 Anemia in chronic kidney disease: Secondary | ICD-10-CM | POA: Diagnosis not present

## 2016-12-17 DIAGNOSIS — Z87891 Personal history of nicotine dependence: Secondary | ICD-10-CM | POA: Diagnosis not present

## 2016-12-17 DIAGNOSIS — N183 Chronic kidney disease, stage 3 (moderate): Secondary | ICD-10-CM | POA: Diagnosis not present

## 2016-12-17 DIAGNOSIS — E785 Hyperlipidemia, unspecified: Secondary | ICD-10-CM | POA: Diagnosis not present

## 2016-12-22 DIAGNOSIS — E785 Hyperlipidemia, unspecified: Secondary | ICD-10-CM | POA: Diagnosis not present

## 2016-12-22 DIAGNOSIS — I69392 Facial weakness following cerebral infarction: Secondary | ICD-10-CM | POA: Diagnosis not present

## 2016-12-22 DIAGNOSIS — I69393 Ataxia following cerebral infarction: Secondary | ICD-10-CM | POA: Diagnosis not present

## 2016-12-22 DIAGNOSIS — D631 Anemia in chronic kidney disease: Secondary | ICD-10-CM | POA: Diagnosis not present

## 2016-12-22 DIAGNOSIS — Z940 Kidney transplant status: Secondary | ICD-10-CM | POA: Diagnosis not present

## 2016-12-22 DIAGNOSIS — N183 Chronic kidney disease, stage 3 (moderate): Secondary | ICD-10-CM | POA: Diagnosis not present

## 2016-12-22 DIAGNOSIS — Z87891 Personal history of nicotine dependence: Secondary | ICD-10-CM | POA: Diagnosis not present

## 2016-12-22 DIAGNOSIS — R69 Illness, unspecified: Secondary | ICD-10-CM | POA: Diagnosis not present

## 2016-12-22 DIAGNOSIS — I129 Hypertensive chronic kidney disease with stage 1 through stage 4 chronic kidney disease, or unspecified chronic kidney disease: Secondary | ICD-10-CM | POA: Diagnosis not present

## 2016-12-22 DIAGNOSIS — Z7901 Long term (current) use of anticoagulants: Secondary | ICD-10-CM | POA: Diagnosis not present

## 2016-12-24 DIAGNOSIS — N183 Chronic kidney disease, stage 3 (moderate): Secondary | ICD-10-CM | POA: Diagnosis not present

## 2016-12-24 DIAGNOSIS — E785 Hyperlipidemia, unspecified: Secondary | ICD-10-CM | POA: Diagnosis not present

## 2016-12-24 DIAGNOSIS — D631 Anemia in chronic kidney disease: Secondary | ICD-10-CM | POA: Diagnosis not present

## 2016-12-24 DIAGNOSIS — I129 Hypertensive chronic kidney disease with stage 1 through stage 4 chronic kidney disease, or unspecified chronic kidney disease: Secondary | ICD-10-CM | POA: Diagnosis not present

## 2016-12-24 DIAGNOSIS — I69392 Facial weakness following cerebral infarction: Secondary | ICD-10-CM | POA: Diagnosis not present

## 2016-12-24 DIAGNOSIS — R69 Illness, unspecified: Secondary | ICD-10-CM | POA: Diagnosis not present

## 2016-12-24 DIAGNOSIS — Z940 Kidney transplant status: Secondary | ICD-10-CM | POA: Diagnosis not present

## 2016-12-24 DIAGNOSIS — I69393 Ataxia following cerebral infarction: Secondary | ICD-10-CM | POA: Diagnosis not present

## 2016-12-24 DIAGNOSIS — Z7901 Long term (current) use of anticoagulants: Secondary | ICD-10-CM | POA: Diagnosis not present

## 2016-12-24 DIAGNOSIS — Z87891 Personal history of nicotine dependence: Secondary | ICD-10-CM | POA: Diagnosis not present

## 2016-12-25 ENCOUNTER — Encounter: Payer: Self-pay | Admitting: *Deleted

## 2016-12-25 ENCOUNTER — Telehealth: Payer: Self-pay | Admitting: *Deleted

## 2016-12-25 DIAGNOSIS — E785 Hyperlipidemia, unspecified: Secondary | ICD-10-CM | POA: Diagnosis not present

## 2016-12-25 DIAGNOSIS — Z87891 Personal history of nicotine dependence: Secondary | ICD-10-CM | POA: Diagnosis not present

## 2016-12-25 DIAGNOSIS — Z7901 Long term (current) use of anticoagulants: Secondary | ICD-10-CM | POA: Diagnosis not present

## 2016-12-25 DIAGNOSIS — I69393 Ataxia following cerebral infarction: Secondary | ICD-10-CM | POA: Diagnosis not present

## 2016-12-25 DIAGNOSIS — I129 Hypertensive chronic kidney disease with stage 1 through stage 4 chronic kidney disease, or unspecified chronic kidney disease: Secondary | ICD-10-CM | POA: Diagnosis not present

## 2016-12-25 DIAGNOSIS — I6930 Unspecified sequelae of cerebral infarction: Secondary | ICD-10-CM

## 2016-12-25 DIAGNOSIS — I69392 Facial weakness following cerebral infarction: Secondary | ICD-10-CM | POA: Diagnosis not present

## 2016-12-25 DIAGNOSIS — N183 Chronic kidney disease, stage 3 (moderate): Secondary | ICD-10-CM | POA: Diagnosis not present

## 2016-12-25 DIAGNOSIS — R69 Illness, unspecified: Secondary | ICD-10-CM | POA: Diagnosis not present

## 2016-12-25 DIAGNOSIS — D631 Anemia in chronic kidney disease: Secondary | ICD-10-CM | POA: Diagnosis not present

## 2016-12-25 DIAGNOSIS — Z940 Kidney transplant status: Secondary | ICD-10-CM | POA: Diagnosis not present

## 2016-12-25 NOTE — Telephone Encounter (Signed)
Jim from [facility] PT called stating that pt is going to be d/c from [facility] PT and needs order for outpatient neuro rehab. Clair Gulling stated that pt will need Rx/order for this. Please advise. Thanks.

## 2016-12-25 NOTE — Telephone Encounter (Signed)
OK, neuro rehab ordered.

## 2016-12-25 NOTE — Telephone Encounter (Signed)
Patient has been advised that referral has been entered. She will contact that office to schedule an appt

## 2016-12-28 DIAGNOSIS — Z860101 Personal history of adenomatous and serrated colon polyps: Secondary | ICD-10-CM

## 2016-12-28 DIAGNOSIS — Z86010 Personal history of colonic polyps: Secondary | ICD-10-CM

## 2016-12-28 HISTORY — DX: Personal history of colonic polyps: Z86.010

## 2016-12-28 HISTORY — DX: Personal history of adenomatous and serrated colon polyps: Z86.0101

## 2016-12-29 DIAGNOSIS — Z4822 Encounter for aftercare following kidney transplant: Secondary | ICD-10-CM | POA: Diagnosis not present

## 2016-12-29 DIAGNOSIS — R42 Dizziness and giddiness: Secondary | ICD-10-CM | POA: Diagnosis not present

## 2016-12-29 DIAGNOSIS — I639 Cerebral infarction, unspecified: Secondary | ICD-10-CM | POA: Diagnosis not present

## 2016-12-29 DIAGNOSIS — D899 Disorder involving the immune mechanism, unspecified: Secondary | ICD-10-CM | POA: Diagnosis not present

## 2016-12-29 DIAGNOSIS — Z85820 Personal history of malignant melanoma of skin: Secondary | ICD-10-CM | POA: Diagnosis not present

## 2016-12-29 DIAGNOSIS — I10 Essential (primary) hypertension: Secondary | ICD-10-CM | POA: Diagnosis not present

## 2016-12-29 DIAGNOSIS — R112 Nausea with vomiting, unspecified: Secondary | ICD-10-CM | POA: Diagnosis not present

## 2016-12-29 DIAGNOSIS — B999 Unspecified infectious disease: Secondary | ICD-10-CM | POA: Diagnosis not present

## 2016-12-29 DIAGNOSIS — E785 Hyperlipidemia, unspecified: Secondary | ICD-10-CM | POA: Diagnosis not present

## 2016-12-29 DIAGNOSIS — Z940 Kidney transplant status: Secondary | ICD-10-CM | POA: Diagnosis not present

## 2016-12-29 DIAGNOSIS — Z8673 Personal history of transient ischemic attack (TIA), and cerebral infarction without residual deficits: Secondary | ICD-10-CM | POA: Diagnosis not present

## 2016-12-29 DIAGNOSIS — D8989 Other specified disorders involving the immune mechanism, not elsewhere classified: Secondary | ICD-10-CM | POA: Diagnosis not present

## 2016-12-29 DIAGNOSIS — I120 Hypertensive chronic kidney disease with stage 5 chronic kidney disease or end stage renal disease: Secondary | ICD-10-CM | POA: Diagnosis not present

## 2016-12-29 DIAGNOSIS — Z87891 Personal history of nicotine dependence: Secondary | ICD-10-CM | POA: Diagnosis not present

## 2016-12-29 DIAGNOSIS — E039 Hypothyroidism, unspecified: Secondary | ICD-10-CM | POA: Diagnosis not present

## 2016-12-29 DIAGNOSIS — R69 Illness, unspecified: Secondary | ICD-10-CM | POA: Diagnosis not present

## 2016-12-29 DIAGNOSIS — N186 End stage renal disease: Secondary | ICD-10-CM | POA: Diagnosis not present

## 2016-12-29 DIAGNOSIS — Z7902 Long term (current) use of antithrombotics/antiplatelets: Secondary | ICD-10-CM | POA: Diagnosis not present

## 2016-12-30 ENCOUNTER — Ambulatory Visit: Payer: Medicare HMO | Attending: Family Medicine | Admitting: Rehabilitative and Restorative Service Providers"

## 2016-12-30 DIAGNOSIS — R2689 Other abnormalities of gait and mobility: Secondary | ICD-10-CM

## 2016-12-30 DIAGNOSIS — R42 Dizziness and giddiness: Secondary | ICD-10-CM

## 2016-12-30 DIAGNOSIS — R2681 Unsteadiness on feet: Secondary | ICD-10-CM | POA: Diagnosis present

## 2016-12-30 NOTE — Patient Instructions (Signed)
Gaze Stabilization - Tip Card  1.Target must remain in focus, not blurry, and appear stationary while head is in motion. 2.Perform exercises with small head movements (45 to either side of midline). 3.Increase speed of head motion so long as target is in focus. 4.If you wear eyeglasses, be sure you can see target through lens (therapist will give specific instructions for bifocal / progressive lenses). 5.These exercises may provoke dizziness or nausea. Work through these symptoms. If too dizzy, slow head movement slightly. Rest between each exercise. 6.Exercises demand concentration; avoid distractions. 7.For safety, perform standing exercises close to a counter, wall, corner, or next to someone.  Copyright  VHI. All rights reserved.   Gaze Stabilization - Standing Feet Apart   Feet shoulder width apart, keeping eyes on target on wall 3 feet away, tilt head down slightly and move head side to side for 30 seconds. Repeat while moving head up and down for 30 seconds. Do 2 sessions per day.   Copyright  VHI. All rights reserved.   Feet Apart (Compliant Surface) Varied Arm Positions - Eyes Closed    Stand on compliant surface: __pillow______ with feet shoulder width apart and arms out. Close eyes and visualize upright position. Hold__20__ seconds. Repeat _3___ times per session. Do __2__ sessions per day.  Copyright  VHI. All rights reserved.   Feet Partial Heel-Toe, Varied Arm Positions - Eyes Open    With eyes open, right foot partially in front of the other, arms at your side, look straight ahead at a stationary object. Hold _30___ seconds, switch feet. Repeat _3___ times per session. Do _2___ sessions per day.  Copyright  VHI. All rights reserved.   ABDUCTION: Side-Lying (Active)    Lie on left side, top leg straight. Raise top leg as far as possible.  Complete _1__ sets of __15_ repetitions. Perform _2__ sessions per day.  [email or web address].[email or web address]   Copyright   VHI. All rights reserved.

## 2016-12-30 NOTE — Therapy (Signed)
[facility] [address] [place], [place], [address] Phone: [phone]   Fax:  [phone]  Physical Therapy Evaluation  Patient Details  Name: Curtis Jimenez MRN: [id number] Date of Birth: 02/17/1936 Referring Provider: Ricardo Jericho, MD  Encounter Date: 12/30/2016      PT End of Session - 12/30/16 0946    Visit Number 1   Number of Visits 16   Date for PT Re-Evaluation 02/28/17   Authorization Type G code every 10th visit ($40 copay)   PT Start Time 0845   PT Stop Time 0945   PT Time Calculation (min) 60 min   Equipment Utilized During Treatment Gait belt   Activity Tolerance Patient tolerated treatment well   Behavior During Therapy [facility] for tasks assessed/performed      Past Medical History:  Diagnosis Date  . Anemia   . Arthritis   . Basal cell carcinoma    neck (skin MD 2X per year)  . Bipolar disorder ([place])   . BPH (benign prostatic hyperplasia)    with elevated PSA; followed by Dr. Rosana Hoes at [facility]  . Cataract   . Chronic renal insufficiency, stage III (moderate)    Last f/u with neph 07/06/16: Cr stable at 1.57 (GFR 42)  . CVA (cerebral vascular accident) ([place]) 11/2016   P[place] (imaging neg), TPA given.  Pt discharged on plavix.  Carotid dopplers ok, echocardiogram ok.  Residual deficit: vertigo, nystagmus, R facial droop.  . Gallbladder polyp 2015   Asymptomatic  . GERD (gastroesophageal reflux disease)   . Gout   . Hyperlipidemia   . Hypertension   . Hypothyroidism   . Restless legs syndrome   . 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 AV FISTULA PLACEMENT  04/08/2012   Procedure: ARTERIOVENOUS (AV) FISTULA CREATION;  Surgeon: Angelia Mould, MD;  Location: [facility] OR;  Service: Vascular;  Laterality: Left;  Creation of left  brachial cephalic arteriovenous fistula  . COLONOSCOPY  2013   Normal except diverticulosis (recall 10 yrs)  . dilation for GERD    . KIDNEY TRANSPLANT  11/06/12   [place] (cadaveric)  . PROSTATE BIOPSY  2011   no malignancy  . TRANSTHORACIC ECHOCARDIOGRAM  11/2016   Normal LV systolic fxn, EF 85-27%.  Grade I DD.  Mild aortic root dilatation, mild MV regurg.    There were no vitals filed for this visit.       Subjective Assessment - 12/30/16 0850    Subjective The patient had sudden onset of spinning and dizziness accompanied by n/v (severe x 2 days), nystagmus, imbalance.  The patient was provided TPA and d/c home where he received [facility] PT/OT (no ST) and d/c on Friday.  He is currently using RW for most household mobility.  He is walking in the kitchen without walker.  He notes dizziness with quick turns that lasts approximately one second.    Patient is accompained by: Family member  spouse, [name]   Patient Stated Goals Walking in a month without a walker (maybe with a cane).   Currently in Pain? No/denies            [facility] PT Assessment - 12/30/16 0854      Assessment   Medical Diagnosis cerebellar stroke, given TPA   Referring Provider Ricardo Jericho, MD   Onset Date/Surgical Date  11/30/16   Prior Therapy acute PT and HH PT     Precautions   Precautions Fall;Other (comment)  L UE- no BP.   Precaution Comments "started to fall 5 times, but always something there to catch me"     Restrictions   Weight Bearing Restrictions No     Balance Screen   Has the patient fallen in the past 6 months No   Has the patient had a decrease in activity level because of a fear of falling?  Yes   Is the patient reluctant to leave their home because of a fear of falling?  Yes     [place] Private residence   Living Arrangements Spouse/significant other   Type of [place] Access Level entry  2" step   [place] One level   [place] - 2 wheels     Prior Function   Level of [place] walked a couple of days per week, go to the [facility] 2 days/week for exercise (downtown), completely independent.     Cognition   Overall Cognitive Status Within Functional Limits for tasks assessed     Observation/Other Assessments   Focus on Therapeutic Outcomes (FOTO)  51%   Other Surveys  Other Surveys   Neuro Quality of Life  44.7%     Sensation   Additional Comments Has left fistula (had prior numbness in hand from this)     Coordination   Finger Nose Finger Test mild dyscoordination and tremor noted on L hand   Heel Shin Test tightness in hips hinders speed of movement     Posture/Postural Control   Posture/Postural Control No significant limitations     ROM / Strength   AROM / PROM / Strength AROM;Strength     AROM   Overall AROM  Within functional limits for tasks performed     Strength   Overall Strength Deficits   Overall Strength Comments Mild weakness 4/5 bilateral hip flexion, R shoulder 4/5 and abduction 4/5.  5/5 L UE and 5/5 bilateral knee flexion/extension.       Bed Mobility   Bed Mobility --  independent per subjective reports     Transfers   Transfers Sit to Stand;Stand to Sit   Sit to Stand 7: Independent   Stand to Sit 7: Independent     Ambulation/Gait   Ambulation/Gait Yes   Ambulation/Gait Assistance 6: Modified independent (Device/Increase time)   Ambulation Distance (Feet) 250 Feet   Assistive device Rolling walker;None   Gait Pattern Decreased arm swing - right;Decreased arm swing - left;Decreased stride length;Decreased trunk rotation  L hip trendelenburg; maintains head forward to look at floor   Ambulation Surface Level   Gait velocity 2.70 ft/sec without device and 2.59 ft/sec with RW.    Stairs Yes   Stairs Assistance 6: Modified independent (Device/Increase time)   Stair Management Technique One rail Right;Alternating pattern   Number of Stairs 4    Gait Comments Patient notes had stopped using RW in home and used [facility], however had multiple losses of balance when turning to the right and went back to RW use due to dec'd confidence and concern he would fall.       Standardized Balance Assessment   Standardized Balance Assessment Berg Balance Test;Timed Up and Go Test     Berg Balance Test   Sit to Stand Able to stand without using hands and stabilize independently  Standing Unsupported Able to stand safely 2 minutes   Sitting with Back Unsupported but Feet Supported on Floor or Stool Able to sit safely and securely 2 minutes   Stand to Sit Sits safely with minimal use of hands   Transfers Able to transfer safely, minor use of hands   Standing Unsupported with Eyes Closed Able to stand 10 seconds with supervision   Standing Ubsupported with Feet Together Able to place feet together independently and stand 1 minute safely   From Standing, Reach Forward with Outstretched Arm Can reach forward >5 cm safely (2")   From Standing Position, Pick up Object from Floor Able to pick up shoe, needs supervision   From Standing Position, Turn to Look Behind Over each Shoulder Turn sideways only but maintains balance   Turn 360 Degrees Needs close supervision or verbal cueing   Standing Unsupported, Alternately Place Feet on Step/Stool Able to complete 4 steps without aid or supervision   Standing Unsupported, One Foot in Front Able to take small step independently and hold 30 seconds   Standing on One Leg Tries to lift leg/unable to hold 3 seconds but remains standing independently   Total Score 40   Berg comment: 40/56 indicating high fall risk     Timed Up and Go Test   TUG --  14.34 seconds without device iwht supervision            Vestibular Assessment - 12/30/16 0918      Occulomotor Exam   Occulomotor Alignment Abnormal  L eye hypertropia     Vestibulo-Occular Reflex   Comment dizziness/blurring with standing gaze x 1 adaptation      Positional Sensitivities   Head Turning x 5 No dizziness   Head Nodding x 5 No dizziness   Pivot Right in Standing Mild dizziness   Pivot Left in Standing Mild dizziness   Rolling Right Mild dizziness               [facility] Adult PT Treatment/Exercise - 12/30/16 7672      Neuro Re-ed    Neuro Re-ed Details  Balance HEP established:  see patient instructions.     Exercises   Exercises Other Exercises   Other Exercises  Patient had [facility] handouts consisting of hip abduction, squats, heel raises, single leg stand.  PT provided updated HEP with emphasis on vestibular patheway for balance.                 PT Education - 12/30/16 0946    Education provided Yes   Education Details HEP: partial heel/toe and pillow stand + eyes closed (in corner), gaze x 1 viewing.   Person(s) Educated Patient;Spouse   Methods Explanation;Demonstration;Handout   Comprehension Verbalized understanding;Returned demonstration;Tactile cues required          PT Short Term Goals - 12/30/16 0951      PT SHORT TERM GOAL #1   Title The patient will return demo HEP independently with written instruction.   Baseline Target date 01/30/2017   Time 4   Period Weeks     PT SHORT TERM GOAL #2   Title The patient will improve Berg from 40/56 up to 45/56 to demo dec'ing risk for falls.   Baseline Target date 01/30/2017   Time 4   Period Weeks     PT SHORT TERM GOAL #3   Title The patient will ambulate household surfaces without a device independently x 300 ft in clinic (and per subjective report for  home).   Baseline Target date 01/30/2017   Time 4   Period Weeks     PT SHORT TERM GOAL #4   Title The patient will improve gait speed without device from 2.7 ft/sec to > or equal to 3.1 ft/sec to demo increasing mobility for community activities.   Baseline Target date 01/30/2017   Time 4   Period Weeks     PT SHORT TERM GOAL #5   Title The patient will ambulate in busy environment carrying a plate  (with SPC if needed) in order to return to weekly lunch outing with friends.   Baseline Target date 01/30/2017   Time 4   Period Weeks     Additional Short Term Goals   Additional Short Term Goals Yes     PT SHORT TERM GOAL #6   Title The patient will be further assessed on FGA to establish baseline for LTG.   Baseline Target date 01/30/2017   Time 4   Period Weeks           PT Long Term Goals - 12/30/16 0954      PT LONG TERM GOAL #1   Title The patient will return to [facility] routine with guidance from PT on appropriate activities.   Baseline Target date 02/28/17   Time 8   Period Weeks     PT LONG TERM GOAL #2   Title The patient will improve Berg score from 40/56 to > or equal to 49/56 to demonstrate improving high level balance.   Baseline Target date 02/28/17   Time 8   Period Weeks     PT LONG TERM GOAL #3   Title The patient will improve TUG from 14.34 seconds to < or equal to 13 seconds without a device to demo dec'ing risk for falls.   Baseline Target date 02/28/17   Time 8   Period Weeks     PT LONG TERM GOAL #4   Title The patient will negotiate 4 steps (x 3 reps) without handrails with reciprocal pattern independently.    Baseline Target date 02/28/17   Time 8   Period Weeks     PT LONG TERM GOAL #5   Title The patient will return to community ambulation without a device independently including level/unlevel surfaces (grass, curbs, inclines).   Baseline Target date 02/28/17   Time 8   Period Weeks     Additional Long Term Goals   Additional Long Term Goals --     PT LONG TERM GOAL #6   Title The patient will improve FGA from 6 points over established baseline (see STGs)   Baseline Target date 02/28/17   Time 8   Period Weeks     PT LONG TERM GOAL #7   Title Improve Neuro QOL: LE by > or equal to 15% (baseline is 44.7%).   Baseline Target date 02/28/17   Time 8   Period Weeks               Plan - 12/30/16 6761    Clinical Impression Statement The  patient is a 81 year old male s/p CVA on 11/30/16 presenting to OP rehab with deficits in dynamic gait and balance, continued dizziness with head motion/turns, and general instability.  PT to optimize mobility for return to community activities and reduce risk for falling.   Rehab Potential Good   PT Frequency 2x / week   PT Duration 8 weeks  may reduce to 1x/week after 4 weeks  to emphasize return to gym   PT Treatment/Interventions ADLs/Self Care Home Management;Therapeutic activities;Therapeutic exercise;Balance training;Neuromuscular re-education;Vestibular;Patient/family education;Functional mobility training;Gait training;Stair training;DME Instruction   PT Next Visit Plan Do FGA (see STG/LTG), provide hamstring stretching (appeared tight through observation), check HEP and progress as indicated, work in PT on dynamic gait without device.   Consulted and Agree with Plan of Care Patient;Family member/caregiver   Family Member Consulted wife, Lenna Sciara      Patient will benefit from skilled therapeutic intervention in order to improve the following deficits and impairments:  Abnormal gait, Decreased balance, Difficulty walking, Dizziness, Postural dysfunction, Decreased mobility, Impaired flexibility  Visit Diagnosis: Other abnormalities of gait and mobility - Plan: PT plan of care cert/re-cert  Dizziness and giddiness - Plan: PT plan of care cert/re-cert  Unsteadiness on feet - Plan: PT plan of care cert/re-cert      G-Codes - 92/92/44 1003    Functional Assessment Tool Used Berg=40/56   Mobility: Walking and Moving Around Current Status ([id number]) At least 20 percent but less than 40 percent impaired, limited or restricted   Mobility: Walking and Moving Around Goal Status [phone]) At least 1 percent but less than 20 percent impaired, limited or restricted          G-Codes - 01/02/2017 1003    Functional Assessment Tool Used Berg=40/56   Mobility: Walking and Moving Around Current Status  (H6579) At least 20 percent but less than 40 percent impaired, limited or restricted   Mobility: Walking and Moving Around Goal Status ([id number]) At least 1 percent but less than 20 percent impaired, limited or restricted        Problem List Patient Active Problem List   Diagnosis Date Noted  . Stroke ([place]) 11/30/2016  . Essential hypertension 01/16/2015  . S/p cadaver renal transplant 11/06/2012  . Chronic kidney disease (CKD), stage IV (severe) ([place]) 03/18/2012  . BENIGN PROSTATIC HYPERTROPHY 04/18/2008  . EXTRINSIC ASTHMA, UNSPECIFIED 12/19/2007  . PSA, INCREASED 12/09/2007  . Hypothyroidism 08/12/2007  . Bipolar disorder ([place]) 07/27/2007  . Hyperlipidemia 07/07/2007  . Gout 07/07/2007  . HYPERTENSION 07/07/2007  . GERD 07/07/2007    Charnice Zwilling, PT 01/02/2017, 10:09 AM  [facility] [address] [place], [place], [address] Phone: [phone]   Fax:  [phone]  Name: MARGUES FILIPPINI MRN: [id number] Date of Birth: Nov 02, 1936

## 2017-01-04 ENCOUNTER — Ambulatory Visit: Payer: Medicare HMO | Admitting: Physical Therapy

## 2017-01-04 DIAGNOSIS — R42 Dizziness and giddiness: Secondary | ICD-10-CM

## 2017-01-04 DIAGNOSIS — R2681 Unsteadiness on feet: Secondary | ICD-10-CM

## 2017-01-04 DIAGNOSIS — R2689 Other abnormalities of gait and mobility: Secondary | ICD-10-CM | POA: Diagnosis not present

## 2017-01-04 NOTE — Therapy (Signed)
[facility] [address] [place], [place], [address] Phone: [phone]   Fax:  [phone]  Physical Therapy Treatment  Patient Details  Name: Curtis Jimenez MRN: [id number] Date of Birth: [birth year] Referring Provider: Ricardo Jericho, MD  Encounter Date: 01/04/2017      PT End of Session - 01/04/17 1111    Visit Number 2   Number of Visits 16   PT Start Time 0935   PT Stop Time 1030   PT Time Calculation (min) 55 min   Equipment Utilized During Treatment Gait belt   Activity Tolerance Patient tolerated treatment well   Behavior During Therapy [facility] for tasks assessed/performed      Past Medical History:  Diagnosis Date  . Anemia   . Arthritis   . Basal cell carcinoma    neck (skin MD 2X per year)  . Bipolar disorder ([place])   . BPH (benign prostatic hyperplasia)    with elevated PSA; followed by Dr. Rosana Hoes at [facility]  . Cataract   . Chronic renal insufficiency, stage III (moderate)    Last f/u with neph 07/06/16: Cr stable at 1.57 (GFR 42)  . CVA (cerebral vascular accident) ([place]) 11/2016   Pontine (imaging neg), TPA given.  Pt discharged on plavix.  Carotid dopplers ok, echocardiogram ok.  Residual deficit: vertigo, nystagmus, R facial droop.  . Gallbladder polyp 2015   Asymptomatic  . GERD (gastroesophageal reflux disease)   . Gout   . Hyperlipidemia   . Hypertension   . Hypothyroidism   . Restless legs syndrome   . S/p cadaver renal transplant 2013   Secondary to HTN +lithium toxicity over 30 yrs caused kidney destruction [facility] transplant MDs)    Past Surgical History:  Procedure Laterality Date  . AV FISTULA PLACEMENT  04/08/2012   Procedure: ARTERIOVENOUS (AV) FISTULA CREATION;  Surgeon: Angelia Mould, MD;  Location: [facility] OR;  Service: Vascular;  Laterality: Left;  Creation of left brachial cephalic arteriovenous fistula  . COLONOSCOPY  2013   Normal except diverticulosis (recall  10 yrs)  . dilation for GERD    . KIDNEY TRANSPLANT  11/06/12   [place] (cadaveric)  . PROSTATE BIOPSY  2011   no malignancy  . TRANSTHORACIC ECHOCARDIOGRAM  11/2016   Normal LV systolic fxn, EF 82-42%.  Grade I DD.  Mild aortic root dilatation, mild MV regurg.    There were no vitals filed for this visit.      Subjective Assessment - 01/04/17 1059    Subjective Pt reports he has been ambulating at home without the RW through his living room, bed room in addition to kitchen but still very slowly.  He still cannot turn his head while ambulating without fear of LOB.  Has been performing HEP but reports not being able to perform eyes closed, standing on pillows >4 seconds.  He also reports that the sidelying hip ABD is too easy.     Currently in Pain? No/denies            [facility] PT Assessment - 01/04/17 0956      Functional Gait  Assessment   Gait Level Surface Walks 20 ft in less than 7 sec but greater than 5.5 sec, uses assistive device, slower speed, mild gait deviations, or deviates 6-10 in outside of the 12 in walkway width.   Change in Gait Speed Able to change speed, demonstrates mild gait deviations, deviates 6-10 in outside of the 12 in walkway width, or  no gait deviations, unable to achieve a major change in velocity, or uses a change in velocity, or uses an assistive device.   Gait with Horizontal Head Turns Performs head turns with moderate changes in gait velocity, slows down, deviates 10-15 in outside 12 in walkway width but recovers, can continue to walk.   Gait with Vertical Head Turns Performs task with moderate change in gait velocity, slows down, deviates 10-15 in outside 12 in walkway width but recovers, can continue to walk.   Gait and Pivot Turn Pivot turns safely in greater than 3 sec and stops with no loss of balance, or pivot turns safely within 3 sec and stops with mild imbalance, requires small steps to catch balance.   Step Over Obstacle Is able to step over one  shoe box (4.5 in total height) but must slow down and adjust steps to clear box safely. May require verbal cueing.   Gait with Narrow Base of Support Ambulates 4-7 steps.   Gait with Eyes Closed Cannot walk 20 ft without assistance, severe gait deviations or imbalance, deviates greater than 15 in outside 12 in walkway width or will not attempt task.   Ambulating Backwards Walks 20 ft, uses assistive device, slower speed, mild gait deviations, deviates 6-10 in outside 12 in walkway width.   Steps Alternating feet, no rail.   Total Score 15   FGA comment: completed without AD; reports fatigue.  HIgh falls risk.                     [facility] Adult PT Treatment/Exercise - 01/04/17 1046      Exercises   Exercises Knee/Hip     Knee/Hip Exercises: Stretches   Passive Hamstring Stretch Right;Left;30 seconds  seated edge of mat with belt around foot     Knee/Hip Exercises: Sidelying   Hip ABduction Strengthening;Both;1 set;10 reps  With Green theraband around thighs for added resistance         Vestibular Treatment/Exercise - 01/04/17 1105      Vestibular Treatment/Exercise   Gaze Exercises X1 Viewing Horizontal;X1 Viewing Vertical     X1 Viewing Horizontal   Foot Position apart, non compliant   Reps 2   Comments 20 seconds horizontal, 30 seconds vertical.  Advised to increase speed of horizontal head turns.  No UE support            Balance Exercises - 01/04/17 1107      Balance Exercises: Standing   Standing Eyes Closed Wide (BOA);Foam/compliant surface;2 reps;30 secs  advised to spread arms out to touch walls for support   Tandem Stance Eyes open;Foam/compliant surface;2 reps;30 secs;Intermittent upper extremity support           PT Education - 01/04/17 1109    Education provided Yes   Education Details Reviewed and adjusted HEP; arms spread for eyes closed (in corner) on pillow for support, added theraband around thighs during hip ABD, added and provided  handout for seated hamstring stretch with belt   Person(s) Educated Patient   Methods Explanation;Demonstration   Comprehension Verbalized understanding;Returned demonstration          PT Short Term Goals - 01/04/17 1244      PT SHORT TERM GOAL #1   Title The patient will return demo HEP independently with written instruction.   Baseline Target date 01/30/2017   Status On-going     PT SHORT TERM GOAL #2   Title The patient will improve Berg from 40/56 up to  45/56 to demo dec'ing risk for falls.   Baseline Target date 01/30/2017   Status On-going     PT SHORT TERM GOAL #3   Title The patient will ambulate household surfaces without a device independently x 300 ft in clinic (and per subjective report for home).   Baseline Target date 01/30/2017   Status On-going     PT SHORT TERM GOAL #4   Title The patient will improve gait speed without device from 2.7 ft/sec to > or equal to 3.1 ft/sec to demo increasing mobility for community activities.   Baseline Target date 01/30/2017   Status On-going     PT SHORT TERM GOAL #5   Title The patient will ambulate in busy environment carrying a plate (with SPC if needed) in order to return to weekly lunch outing with friends.   Baseline Target date 01/30/2017   Status On-going     Additional Short Term Goals   Additional Short Term Goals Yes     PT SHORT TERM GOAL #6   Title The patient will be further assessed on FGA to establish baseline for LTG.   Baseline Target date 01/30/2017   Status Achieved     PT SHORT TERM GOAL #7   Title Pt will improve balance and decrease falls risk during dynamic gait as indicated by FGA score of >24/30   Baseline FGA: 15/30 without AD; Target Date 01/30/2017   Status New           PT Long Term Goals - 01/04/17 1247      PT LONG TERM GOAL #1   Title The patient will return to [facility] routine with guidance from PT on appropriate activities.   Baseline Target date 02/28/17   Time 8   Period Weeks   Status  On-going     PT LONG TERM GOAL #2   Title The patient will improve Berg score from 40/56 to > or equal to 49/56 to demonstrate improving high level balance.   Baseline Target date 02/28/17   Status On-going     PT LONG TERM GOAL #3   Title The patient will improve TUG from 14.34 seconds to < or equal to 13 seconds without a device to demo dec'ing risk for falls.   Baseline Target date 02/28/17   Status On-going     PT LONG TERM GOAL #4   Title The patient will negotiate 4 steps (x 3 reps) without handrails with reciprocal pattern independently.    Baseline Target date 02/28/17   Status On-going     PT LONG TERM GOAL #5   Title The patient will return to community ambulation without a device independently including level/unlevel surfaces (grass, curbs, inclines).   Baseline Target date 02/28/17   Status On-going     PT LONG TERM GOAL #6   Title The patient will improve FGA from 10 points over established baseline (see STGs)   Baseline Target date 02/28/17   Time 8   Period Weeks   Status Revised     PT LONG TERM GOAL #7   Title Improve Neuro QOL: LE by > or equal to 15% (baseline is 44.7%).   Baseline Target date 02/28/17   Status On-going               Plan - 01/04/17 1112    Clinical Impression Statement Pt demonstrating functional improvements in balance as indicated by patient increasing ambulation distance in his home without RW but with gait modifications: slowing  down, no head turns.  He also reports increased difficulty dual tasking with gait.  Continued gait and balane impairments noted during FGA; pt score of 15 indicates high falls risk.  Will continue to address and will begin gait assessment and training with [facility] next visit.  Pt is performing HEP and is able to demonstrate each exercise; instructed in modifications for improved effectiveness as well as added hamstring stretch for overall ROM.  Will benefit from continued skilled PT to address vestibular, balance and gait  deficits.   PT Treatment/Interventions ADLs/Self Care Home Management;Therapeutic activities;Therapeutic exercise;Balance training;Neuromuscular re-education;Vestibular;Patient/family education;Functional mobility training;Gait training;Stair training;DME Instruction   PT Next Visit Plan FGA completed; review corner HEP and progress as indicated and adding head turns as appropriate, work in PT on dynamic gait transition to [place] next visit.  Initiate gait training with dual tasking and head turns   Consulted and Agree with Plan of Care Patient      Patient will benefit from skilled therapeutic intervention in order to improve the following deficits and impairments:  Abnormal gait, Decreased balance, Difficulty walking, Dizziness, Postural dysfunction, Decreased mobility, Impaired flexibility  Visit Diagnosis: Other abnormalities of gait and mobility  Dizziness and giddiness  Unsteadiness on feet     Problem List Patient Active Problem List   Diagnosis Date Noted  . Stroke ([place]) 11/30/2016  . Essential hypertension 01/16/2015  . S/p cadaver renal transplant 11/06/2012  . Chronic kidney disease (CKD), stage IV (severe) ([place]) 03/18/2012  . BENIGN PROSTATIC HYPERTROPHY 04/18/2008  . EXTRINSIC ASTHMA, UNSPECIFIED 12/19/2007  . PSA, INCREASED 12/09/2007  . Hypothyroidism 08/12/2007  . Bipolar disorder ([place]) 07/27/2007  . Hyperlipidemia 07/07/2007  . Gout 07/07/2007  . HYPERTENSION 07/07/2007  . GERD 07/07/2007   Raylene Everts, PT, DPT 01/04/17    12:54 PM   [facility] [address] [place], [place], [address] Phone: [phone]   Fax:  [phone]  Name: Curtis Jimenez MRN: [id number] Date of Birth: [birth year]

## 2017-01-04 NOTE — Patient Instructions (Signed)
Hamstring Stretch, Seated (Strap, Two Chairs)    Sit with one leg extended foot on floor. Loop strap over outstretched foot at ball of big toe. Lengthen spine. Hold for _30___ breaths. Repeat _2___ times each leg.  Copyright  VHI. All rights reserved.

## 2017-01-08 ENCOUNTER — Ambulatory Visit: Payer: Medicare HMO | Admitting: Physical Therapy

## 2017-01-08 DIAGNOSIS — R42 Dizziness and giddiness: Secondary | ICD-10-CM

## 2017-01-08 DIAGNOSIS — R2689 Other abnormalities of gait and mobility: Secondary | ICD-10-CM

## 2017-01-08 DIAGNOSIS — R2681 Unsteadiness on feet: Secondary | ICD-10-CM

## 2017-01-08 NOTE — Patient Instructions (Addendum)
Figure Eight    Walk in a figure eight pattern around 2 chairs with cane, eyes straight ahead.  Try to take large steps. Repeat _4__ times per session. Do __2__ sessions per day.   Copyright  VHI. All rights reserved.

## 2017-01-08 NOTE — Therapy (Signed)
[facility] [address] [place], [place], [address] Phone: [phone]   Fax:  [phone]  Physical Therapy Treatment  Patient Details  Name: Curtis Jimenez MRN: [id number] Date of Birth: 1936-10-20 Referring Provider: Ricardo Jericho, MD  Encounter Date: 01/08/2017      PT End of Session - 01/08/17 1202    Visit Number 3   Number of Visits 16   Date for PT Re-Evaluation 02/28/17   Authorization Type G code every 10th visit ($40 copay)   PT Start Time 1050   PT Stop Time 1140   PT Time Calculation (min) 50 min   Activity Tolerance Patient tolerated treatment well   Behavior During Therapy [facility] for tasks assessed/performed      Past Medical History:  Diagnosis Date  . Anemia   . Arthritis   . Basal cell carcinoma    neck (skin MD 2X per year)  . Bipolar disorder ([place])   . BPH (benign prostatic hyperplasia)    with elevated PSA; followed by Dr. Rosana Hoes at [facility]  . Cataract   . Chronic renal insufficiency, stage III (moderate)    Last f/u with neph 07/06/16: Cr stable at 1.57 (GFR 42)  . CVA (cerebral vascular accident) ([place]) 11/2016   Pontine (imaging neg), TPA given.  Pt discharged on plavix.  Carotid dopplers ok, echocardiogram ok.  Residual deficit: vertigo, nystagmus, R facial droop.  . Gallbladder polyp 2015   Asymptomatic  . GERD (gastroesophageal reflux disease)   . Gout   . Hyperlipidemia   . Hypertension   . Hypothyroidism   . Restless legs syndrome   . S/p cadaver renal transplant 2013   Secondary to HTN +lithium toxicity over 30 yrs caused kidney destruction [facility] transplant MDs)    Past Surgical History:  Procedure Laterality Date  . AV FISTULA PLACEMENT  04/08/2012   Procedure: ARTERIOVENOUS (AV) FISTULA CREATION;  Surgeon: Angelia Mould, MD;  Location: [facility] OR;  Service: Vascular;  Laterality: Left;  Creation of left brachial cephalic arteriovenous fistula  .  COLONOSCOPY  2013   Normal except diverticulosis (recall 10 yrs)  . dilation for GERD    . KIDNEY TRANSPLANT  11/06/12   [place] (cadaveric)  . PROSTATE BIOPSY  2011   no malignancy  . TRANSTHORACIC ECHOCARDIOGRAM  11/2016   Normal LV systolic fxn, EF 81-01%.  Grade I DD.  Mild aortic root dilatation, mild MV regurg.    There were no vitals filed for this visit.      Subjective Assessment - 01/08/17 1050    Subjective Pt reports he is performing all HEP but is still unable to perform corner exercise: eyes closed on compliant surface.  Pt is now walking throughout house without RW and went out to lunch with friends yesterday; he reports having difficulty maintaining standing balance without UE support while looking for card in wallet.   Patient Stated Goals Would like to work on gait with cane today; pt has difficulty with changes in direction and turns.     Currently in Pain? No/denies                         [facility] Adult PT Treatment/Exercise - 01/08/17 1159      Ambulation/Gait   Ambulation Distance (Feet) 115 Feet   Assistive device Straight cane   Gait Pattern Step-through pattern;Decreased step length - right;Decreased step length - left;Decreased stride length   Ambulation Surface Level;Unlevel;Indoor  Stairs Yes   Stairs Assistance 5: Supervision   Stair Management Technique No rails;Alternating pattern;Forwards;With cane   Number of Stairs 4   Height of Stairs 6   Ramp 5: Supervision   Ramp Details (indicate cue type and reason) with SPC x 2 reps   Curb 5: Supervision   Curb Details (indicate cue type and reason) with SPC x 2 reps with supervision     High Level Balance   High Level Balance Activities Backward walking;Direction changes;Turns;Sudden stops;Head turns;Figure 8 turns   High Level Balance Comments All with SPC over level ground; supervision             Balance Exercises - 01/08/17 1153      Balance Exercises: Standing   Standing  Eyes Opened Narrow base of support (BOS);Foam/compliant surface;Solid surface;Other (comment)  in corner during 10 balloon taps   Standing Eyes Closed Narrow base of support (BOS);Solid surface;3 reps;10 secs  Focus on use of ankle strategy for hips away from wall   Tandem Stance Eyes open;Other reps (comment)  10 balloon taps each R, L foot forwards           PT Education - 01/08/17 1200    Education provided Yes   Education Details Adjusted corner balance exercise: eyes closed, standing on solid ground, feet together x 10 seconds-will reintroduce compliant surface as pt improves.  Added figure 8 gait with SPC around chairs to focus on changes in direction.   Person(s) Educated Patient   Methods Explanation;Demonstration;Handout   Comprehension Returned demonstration          PT Short Term Goals - 01/04/17 1244      PT SHORT TERM GOAL #1   Title The patient will return demo HEP independently with written instruction.   Baseline Target date 01/30/2017   Status On-going     PT SHORT TERM GOAL #2   Title The patient will improve Berg from 40/56 up to 45/56 to demo dec'ing risk for falls.   Baseline Target date 01/30/2017   Status On-going     PT SHORT TERM GOAL #3   Title The patient will ambulate household surfaces without a device independently x 300 ft in clinic (and per subjective report for home).   Baseline Target date 01/30/2017   Status On-going     PT SHORT TERM GOAL #4   Title The patient will improve gait speed without device from 2.7 ft/sec to > or equal to 3.1 ft/sec to demo increasing mobility for community activities.   Baseline Target date 01/30/2017   Status On-going     PT SHORT TERM GOAL #5   Title The patient will ambulate in busy environment carrying a plate (with SPC if needed) in order to return to weekly lunch outing with friends.   Baseline Target date 01/30/2017   Status On-going     Additional Short Term Goals   Additional Short Term Goals Yes      PT SHORT TERM GOAL #6   Title The patient will be further assessed on FGA to establish baseline for LTG.   Baseline Target date 01/30/2017   Status Achieved     PT SHORT TERM GOAL #7   Title Pt will improve balance and decrease falls risk during dynamic gait as indicated by FGA score of >24/30   Baseline FGA: 15/30 without AD; Target Date 01/30/2017   Status New           PT Long Term Goals - 01/04/17 1247  PT LONG TERM GOAL #1   Title The patient will return to [facility] routine with guidance from PT on appropriate activities.   Baseline Target date 02/28/17   Time 8   Period Weeks   Status On-going     PT LONG TERM GOAL #2   Title The patient will improve Berg score from 40/56 to > or equal to 49/56 to demonstrate improving high level balance.   Baseline Target date 02/28/17   Status On-going     PT LONG TERM GOAL #3   Title The patient will improve TUG from 14.34 seconds to < or equal to 13 seconds without a device to demo dec'ing risk for falls.   Baseline Target date 02/28/17   Status On-going     PT LONG TERM GOAL #4   Title The patient will negotiate 4 steps (x 3 reps) without handrails with reciprocal pattern independently.    Baseline Target date 02/28/17   Status On-going     PT LONG TERM GOAL #5   Title The patient will return to community ambulation without a device independently including level/unlevel surfaces (grass, curbs, inclines).   Baseline Target date 02/28/17   Status On-going     PT LONG TERM GOAL #6   Title The patient will improve FGA from 10 points over established baseline (see STGs)   Baseline Target date 02/28/17   Time 8   Period Weeks   Status Revised     PT LONG TERM GOAL #7   Title Improve Neuro QOL: LE by > or equal to 15% (baseline is 44.7%).   Baseline Target date 02/28/17   Status On-going               Plan - 01/08/17 1202    Clinical Impression Statement Pt continues to demonstrate functional improvements allowing pt to increase  walking distance in his home without use of AD without LOB or falls.  Pt continues to have difficulty with balance exercises with vision removed and dual tasking during standing balance.  Adjusted HEP and initiated dual task training during standing balance corner exercises and initiated higher level gait training with SPC.  Pt demonstrated decreased step length with turns and changes in direction with SPC.  Added figure 8 gait to HEP.  Will benefit from continued PT interventions to address vestibular, balance, and gait impairments.     PT Treatment/Interventions ADLs/Self Care Home Management;Therapeutic activities;Therapeutic exercise;Balance training;Neuromuscular re-education;Vestibular;Patient/family education;Functional mobility training;Gait training;Stair training;DME Instruction   PT Next Visit Plan Continue higher level gait and balance training with SPC-attempt gait outside with [facility] and possibly transition to [facility] only after next week.  Addition of head turns to corner exercises.     Consulted and Agree with Plan of Care Patient      Patient will benefit from skilled therapeutic intervention in order to improve the following deficits and impairments:  Abnormal gait, Decreased balance, Difficulty walking, Dizziness, Postural dysfunction, Decreased mobility, Impaired flexibility  Visit Diagnosis: Other abnormalities of gait and mobility  Dizziness and giddiness  Unsteadiness on feet     Problem List Patient Active Problem List   Diagnosis Date Noted  . Stroke ([place]) 11/30/2016  . Essential hypertension 01/16/2015  . S/p cadaver renal transplant 11/06/2012  . Chronic kidney disease (CKD), stage IV (severe) ([place]) 03/18/2012  . BENIGN PROSTATIC HYPERTROPHY 04/18/2008  . EXTRINSIC ASTHMA, UNSPECIFIED 12/19/2007  . PSA, INCREASED 12/09/2007  . Hypothyroidism 08/12/2007  . Bipolar disorder ([place]) 07/27/2007  . Hyperlipidemia 07/07/2007  .  Gout 07/07/2007  . HYPERTENSION 07/07/2007   . GERD 07/07/2007   Raylene Everts, PT, DPT 01/08/17    12:16 PM   [facility] [address] [place], [place], [address] Phone: [phone]   Fax:  [phone]  Name: Curtis Jimenez MRN: [id number] Date of Birth: 29-Jan-1936

## 2017-01-11 ENCOUNTER — Telehealth: Payer: Self-pay | Admitting: Physical Therapy

## 2017-01-11 ENCOUNTER — Ambulatory Visit: Payer: Medicare HMO | Admitting: Physical Therapy

## 2017-01-11 ENCOUNTER — Encounter: Payer: Self-pay | Admitting: Family Medicine

## 2017-01-11 VITALS — BP 122/64 | HR 74

## 2017-01-11 DIAGNOSIS — R42 Dizziness and giddiness: Secondary | ICD-10-CM

## 2017-01-11 DIAGNOSIS — R2681 Unsteadiness on feet: Secondary | ICD-10-CM

## 2017-01-11 DIAGNOSIS — R2689 Other abnormalities of gait and mobility: Secondary | ICD-10-CM | POA: Diagnosis not present

## 2017-01-11 NOTE — Telephone Encounter (Signed)
FYI-pt presented to OPPT visit today reporting symptoms yesterday of increased dysequilibrium, fatigue and headache.  Pt reports having to return to RW yesterday but was very unsteady even with the RW.  Today pt still reporting fatigue, headache and dysequilibrium but less severe from yesterday.  BP at this visit 122/64.  Pt advised to contact PCP about these symptoms.  Please advise any recommendations.  Thank you for your attention to this, Raylene Everts, PT, DPT 01/11/17    1:08 PM

## 2017-01-11 NOTE — Telephone Encounter (Signed)
Pt advised and voiced understanding.   

## 2017-01-11 NOTE — Telephone Encounter (Signed)
Since he seems to be improved today compared to yesterday, lets give this a little more time. Advise him to drink plenty of fluids, monitor bp daily, take 1000 mg tylenol q6h for headache.  If not continuing to gradually improve tomorrow then come see me wed, thurs, or Friday.   If he gets significantly worse then he should go immediately to the nearest emergency department.-thx

## 2017-01-11 NOTE — Therapy (Signed)
[facility] [address] [place], [place], [address] Phone: [phone]   Fax:  [phone]  Physical Therapy Treatment  Patient Details  Name: Curtis Jimenez MRN: [id number] Date of Birth: 06-Feb-1936 Referring Provider: Ricardo Jericho, MD  Encounter Date: 01/11/2017      PT End of Session - 01/11/17 1146    Visit Number 4   Number of Visits 16   Date for PT Re-Evaluation 02/28/17   Authorization Type G code every 10th visit ($40 copay)   PT Start Time 1101   PT Stop Time 1145   PT Time Calculation (min) 44 min   Activity Tolerance Patient tolerated treatment well   Behavior During Therapy [facility] for tasks assessed/performed      Past Medical History:  Diagnosis Date  . Anemia   . Arthritis   . Basal cell carcinoma    neck (skin MD 2X per year)  . Bipolar disorder ([place])   . BPH (benign prostatic hyperplasia)    with elevated PSA; followed by Dr. Rosana Hoes at [facility]  . Cataract   . Chronic renal insufficiency, stage III (moderate)    Last f/u with neph 07/06/16: Cr stable at 1.57 (GFR 42)  . CVA (cerebral vascular accident) ([place]) 11/2016   Pontine (imaging neg), TPA given.  Pt discharged on plavix.  Carotid dopplers ok, echocardiogram ok.  Residual deficit: vertigo, nystagmus, R facial droop.  . Gallbladder polyp 2015   Asymptomatic  . GERD (gastroesophageal reflux disease)   . Gout   . Hyperlipidemia   . Hypertension   . Hypothyroidism   . Restless legs syndrome   . S/p cadaver renal transplant 2013   Secondary to HTN +lithium toxicity over 30 yrs caused kidney destruction [facility] transplant MDs)    Past Surgical History:  Procedure Laterality Date  . AV FISTULA PLACEMENT  04/08/2012   Procedure: ARTERIOVENOUS (AV) FISTULA CREATION;  Surgeon: Angelia Mould, MD;  Location: [facility] OR;  Service: Vascular;  Laterality: Left;  Creation of left brachial cephalic arteriovenous fistula  .  COLONOSCOPY  2013   Normal except diverticulosis (recall 10 yrs)  . dilation for GERD    . KIDNEY TRANSPLANT  11/06/12   [place] (cadaveric)  . PROSTATE BIOPSY  2011   no malignancy  . TRANSTHORACIC ECHOCARDIOGRAM  11/2016   Normal LV systolic fxn, EF 93-79%.  Grade I DD.  Mild aortic root dilatation, mild MV regurg.    Vitals:   01/11/17 1111  BP: 122/64  Pulse: 74        Subjective Assessment - 01/11/17 1103    Subjective Pt states, "I have been better.  I feel like I had a set back."  Pt reports going to movies and lunch Saturday and using cane and assistance of wife to walk in dark theater.  Reports no issues but was fatigued.  Reports attempting to perform 2 sets of HEP on Sunday but woke from a nap with more dysequilibrium and had to return to the RW but was still unsteady.  Also reports a headache and continued unsteadiness today.  No issues with figure 8 gait at home.   Patient is accompained by: Family member   Patient Stated Goals To perform exercises to tolerance.  Pt would still like to have more confidence with dual task in standing-standing at check out counter and managing debit card/wallet, take communion in standing without fear of falling   Currently in Pain? No/denies  Vestibular Treatment/Exercise - 01/11/17 1137      X1 Viewing Horizontal   Foot Position apart, on wedge-DF and PF   Reps 2   Comments x 30 seconds     X1 Viewing Vertical   Foot Position apart, on wedge-DF and PF   Reps 2   Comments x 30 seconds each            Balance Exercises - 01/11/17 1125      Balance Exercises: Standing   Standing Eyes Opened Narrow base of support (BOS);Head turns;Solid surface;Other reps (comment);5 reps  1 set each vertical and horizontal head turns   Standing Eyes Closed Narrow base of support (BOS);Head turns;Solid surface;5 reps  x 2 sets with one UE support   Tandem Stance Eyes open;Intermittent upper  extremity support;5 reps  R foot forward, L foot forward x 2 sets, head turns     OTAGO PROGRAM   Tandem Walk Support   Heel Walking No support   Toe Walk No support   Overall OTAGO Comments x 2 reps each, intermittent support on counter           PT Education - 01/11/17 1145    Education provided Yes   Education Details Educated on energy conservation at home and on days when pt has outings planned.  Advised to contact MD if he experiences same symptoms as yesterday-dysequilibrium and headache   Person(s) Educated Patient   Methods Explanation   Comprehension Verbalized understanding          PT Short Term Goals - 01/04/17 1244      PT SHORT TERM GOAL #1   Title The patient will return demo HEP independently with written instruction.   Baseline Target date 01/30/2017   Status On-going     PT SHORT TERM GOAL #2   Title The patient will improve Berg from 40/56 up to 45/56 to demo dec'ing risk for falls.   Baseline Target date 01/30/2017   Status On-going     PT SHORT TERM GOAL #3   Title The patient will ambulate household surfaces without a device independently x 300 ft in clinic (and per subjective report for home).   Baseline Target date 01/30/2017   Status On-going     PT SHORT TERM GOAL #4   Title The patient will improve gait speed without device from 2.7 ft/sec to > or equal to 3.1 ft/sec to demo increasing mobility for community activities.   Baseline Target date 01/30/2017   Status On-going     PT SHORT TERM GOAL #5   Title The patient will ambulate in busy environment carrying a plate (with SPC if needed) in order to return to weekly lunch outing with friends.   Baseline Target date 01/30/2017   Status On-going     Additional Short Term Goals   Additional Short Term Goals Yes     PT SHORT TERM GOAL #6   Title The patient will be further assessed on FGA to establish baseline for LTG.   Baseline Target date 01/30/2017   Status Achieved     PT SHORT TERM GOAL #7    Title Pt will improve balance and decrease falls risk during dynamic gait as indicated by FGA score of >24/30   Baseline FGA: 15/30 without AD; Target Date 01/30/2017   Status New           PT Long Term Goals - 01/04/17 1247      PT LONG TERM GOAL #1  Title The patient will return to [facility] routine with guidance from PT on appropriate activities.   Baseline Target date 02/28/17   Time 8   Period Weeks   Status On-going     PT LONG TERM GOAL #2   Title The patient will improve Berg score from 40/56 to > or equal to 49/56 to demonstrate improving high level balance.   Baseline Target date 02/28/17   Status On-going     PT LONG TERM GOAL #3   Title The patient will improve TUG from 14.34 seconds to < or equal to 13 seconds without a device to demo dec'ing risk for falls.   Baseline Target date 02/28/17   Status On-going     PT LONG TERM GOAL #4   Title The patient will negotiate 4 steps (x 3 reps) without handrails with reciprocal pattern independently.    Baseline Target date 02/28/17   Status On-going     PT LONG TERM GOAL #5   Title The patient will return to community ambulation without a device independently including level/unlevel surfaces (grass, curbs, inclines).   Baseline Target date 02/28/17   Status On-going     PT LONG TERM GOAL #6   Title The patient will improve FGA from 10 points over established baseline (see STGs)   Baseline Target date 02/28/17   Time 8   Period Weeks   Status Revised     PT LONG TERM GOAL #7   Title Improve Neuro QOL: LE by > or equal to 15% (baseline is 44.7%).   Baseline Target date 02/28/17   Status On-going               Plan - 01/11/17 1146    Clinical Impression Statement Treatment session with focus on dual tasking and changing BOS during standing balance, vestibular and dynamic gait.  Pt tolerated exercises well despite feeling fatigued and more unsteady today and demonstrated improvement in balance with each repetition.  Pt  noted to have improved gait with increased step and stride length and increased head movement at end of session.  Will continue to monitor at each visit.  Pt will benefit from continued PT interventions to address balance, vestibular, gait and endurance deficits.   PT Treatment/Interventions ADLs/Self Care Home Management;Therapeutic activities;Therapeutic exercise;Balance training;Neuromuscular re-education;Vestibular;Patient/family education;Functional mobility training;Gait training;Stair training;DME Instruction   PT Next Visit Plan Monitor BP if pt reporting headache and increased dysequilibrium.  May need to alert MD if pt continues to have intermittent episodes of dizziness and HA.  Dual task during standing balance and gait activities.   Consulted and Agree with Plan of Care Patient      Patient will benefit from skilled therapeutic intervention in order to improve the following deficits and impairments:  Abnormal gait, Decreased balance, Difficulty walking, Dizziness, Postural dysfunction, Decreased mobility, Impaired flexibility  Visit Diagnosis: Other abnormalities of gait and mobility  Dizziness and giddiness  Unsteadiness on feet     Problem List Patient Active Problem List   Diagnosis Date Noted  . Stroke ([place]) 11/30/2016  . Essential hypertension 01/16/2015  . S/p cadaver renal transplant 11/06/2012  . Chronic kidney disease (CKD), stage IV (severe) ([place]) 03/18/2012  . BENIGN PROSTATIC HYPERTROPHY 04/18/2008  . EXTRINSIC ASTHMA, UNSPECIFIED 12/19/2007  . PSA, INCREASED 12/09/2007  . Hypothyroidism 08/12/2007  . Bipolar disorder ([place]) 07/27/2007  . Hyperlipidemia 07/07/2007  . Gout 07/07/2007  . HYPERTENSION 07/07/2007  . GERD 07/07/2007   Raylene Everts, PT, DPT 01/11/17  11:57 AM   [facility] [address] [place], [place], [address] Phone: [phone]   Fax:  [phone]  Name: Curtis Jimenez MRN: [id number] Date of Birth: [birth year]

## 2017-01-14 ENCOUNTER — Ambulatory Visit: Payer: Medicare HMO | Admitting: Rehabilitative and Restorative Service Providers"

## 2017-01-18 ENCOUNTER — Ambulatory Visit: Payer: Medicare HMO | Admitting: Physical Therapy

## 2017-01-18 DIAGNOSIS — R2681 Unsteadiness on feet: Secondary | ICD-10-CM

## 2017-01-18 DIAGNOSIS — R42 Dizziness and giddiness: Secondary | ICD-10-CM

## 2017-01-18 DIAGNOSIS — R2689 Other abnormalities of gait and mobility: Secondary | ICD-10-CM

## 2017-01-18 NOTE — Therapy (Signed)
[facility] [address] [place], [place], [address] Phone: [phone]   Fax:  [phone]  Physical Therapy Treatment  Patient Details  Name: Curtis Jimenez MRN: [id number] Date of Birth: 09/13/1936 Referring Provider: Ricardo Jericho, MD  Encounter Date: 01/18/2017      PT End of Session - 01/18/17 1145    Visit Number 5   Number of Visits 16   Date for PT Re-Evaluation 02/28/17   Authorization Type G code every 10th visit ($40 copay)   PT Start Time 1105   PT Stop Time 1142   PT Time Calculation (min) 37 min   Activity Tolerance Patient tolerated treatment well   Behavior During Therapy [facility] for tasks assessed/performed      Past Medical History:  Diagnosis Date  . Anemia   . Arthritis   . Basal cell carcinoma    neck (skin MD 2X per year)  . Bipolar disorder ([place])   . BPH (benign prostatic hyperplasia)    with elevated PSA; followed by Dr. Rosana Ho[facility] at [facility]  . Cataract   . Chronic renal insufficiency, stage III (moderate)    Last f/u with neph 07/06/16: Cr stable at 1.57 (GFR 42)  . CVA (cerebral vascular accident) ([place]) 11/2016   Pontine (imaging neg), TPA given.  Pt discharged on plavix.  Carotid dopplers ok, echocardiogram ok.  Residual deficit: vertigo, nystagmus, R facial droop.  . Gallbladder polyp 2015   Asymptomatic  . GERD (gastroesophageal reflux disease)   . Gout   . Hyperlipidemia   . Hypertension   . Hypothyroidism   . Restless legs syndrome   . S/p cadaver renal transplant 2013   Secondary to HTN +lithium toxicity over 30 yrs caused kidney destruction [facility] transplant MDs)    Past Surgical History:  Procedure Laterality Date  . AV FISTULA PLACEMENT  04/08/2012   Procedure: ARTERIOVENOUS (AV) FISTULA CREATION;  Surgeon: Angelia Mould, MD;  Location: [facility] OR;  Service: Vascular;  Laterality: Left;  Creation of left brachial cephalic arteriovenous fistula  .  COLONOSCOPY  2013   Normal except diverticulosis (recall 10 yrs)  . dilation for GERD    . KIDNEY TRANSPLANT  11/06/12   [place] (cadaveric)  . PROSTATE BIOPSY  2011   no malignancy  . TRANSTHORACIC ECHOCARDIOGRAM  11/2016   Normal LV systolic fxn, EF 96-78%.  Grade I DD.  Mild aortic root dilatation, mild MV regurg.    There were no vitals filed for this visit.      Subjective Assessment - 01/18/17 1113    Subjective Pt denies any other episodes of unsteadiness and dysequilibrium.  Pt has returned to no AD in the house and cane outside.  Pt would like to attend jazz festival and lecture on Wednesday; pt reports he will be sitting the whole time.   Patient is accompained by: Family member   Patient Stated Goals Focus on turning.  Pt would still like to have more confidence with dual task in standing-standing at check out counter and managing debit card/wallet, take communion in standing without fear of falling   Currently in Pain? No/denies                         [facility] Adult PT Treatment/Exercise - 01/18/17 1124      Neuro Re-ed    Neuro Re-ed Details  Performed dynamic balance activity with pt standing in corner for safety with pt reaching to  floor to pick up ball and pass back x 10 with wide BOS, 10 narrow BOS.  Added in 5 reps side to side wall taps with ball and 5 reps reaching overhead with each BOS to incorporate more dynamic head movements into balance activities.  Also performed gait with frequent head turns looking and reaching for ball from behind, lateral ball toss to R and L forwards and retro gait, and forwards gait with ball toss to self.  Performed higher level dual task gait/balance with pt ambulating around track x 2 reps while carrying/balancing ball on foam mat to simulate carrying lunch tray in crowded environment, increasing gait speed and around obstacles all with supervision.                    PT Education - 01/18/17 1144    Education  provided Yes   Education Details Contined discussion on energy conservation techniques and use of AD on Wednesday for [employer].   Person(s) Educated Patient   Methods Explanation   Comprehension Verbalized understanding          PT Short Term Goals - 01/04/17 1244      PT SHORT TERM GOAL #1   Title The patient will return demo HEP independently with written instruction.   Baseline Target date 01/30/2017   Status On-going     PT SHORT TERM GOAL #2   Title The patient will improve Berg from 40/56 up to 45/56 to demo dec'ing risk for falls.   Baseline Target date 01/30/2017   Status On-going     PT SHORT TERM GOAL #3   Title The patient will ambulate household surfaces without a device independently x 300 ft in clinic (and per subjective report for home).   Baseline Target date 01/30/2017   Status On-going     PT SHORT TERM GOAL #4   Title The patient will improve gait speed without device from 2.7 ft/sec to > or equal to 3.1 ft/sec to demo increasing mobility for community activities.   Baseline Target date 01/30/2017   Status On-going     PT SHORT TERM GOAL #5   Title The patient will ambulate in busy environment carrying a plate (with SPC if needed) in order to return to weekly lunch outing with friends.   Baseline Target date 01/30/2017   Status On-going     Additional Short Term Goals   Additional Short Term Goals Yes     PT SHORT TERM GOAL #6   Title The patient will be further assessed on FGA to establish baseline for LTG.   Baseline Target date 01/30/2017   Status Achieved     PT SHORT TERM GOAL #7   Title Pt will improve balance and decrease falls risk during dynamic gait as indicated by FGA score of >24/30   Baseline FGA: 15/30 without AD; Target Date 01/30/2017   Status New           PT Long Term Goals - 01/04/17 1247      PT LONG TERM GOAL #1   Title The patient will return to [facility] routine with guidance from PT on appropriate activities.   Baseline Target  date 02/28/17   Time 8   Period Weeks   Status On-going     PT LONG TERM GOAL #2   Title The patient will improve Berg score from 40/56 to > or equal to 49/56 to demonstrate improving high level balance.   Baseline Target date 02/28/17  Status On-going     PT LONG TERM GOAL #3   Title The patient will improve TUG from 14.34 seconds to < or equal to 13 seconds without a device to demo dec'ing risk for falls.   Baseline Target date 02/28/17   Status On-going     PT LONG TERM GOAL #4   Title The patient will negotiate 4 steps (x 3 reps) without handrails with reciprocal pattern independently.    Baseline Target date 02/28/17   Status On-going     PT LONG TERM GOAL #5   Title The patient will return to community ambulation without a device independently including level/unlevel surfaces (grass, curbs, inclines).   Baseline Target date 02/28/17   Status On-going     PT LONG TERM GOAL #6   Title The patient will improve FGA from 10 points over established baseline (see STGs)   Baseline Target date 02/28/17   Time 8   Period Weeks   Status Revised     PT LONG TERM GOAL #7   Title Improve Neuro QOL: LE by > or equal to 15% (baseline is 44.7%).   Baseline Target date 02/28/17   Status On-going               Plan - 01/18/17 1145    Clinical Impression Statement Skilled PT treatment session with focus on higher level balance and dual task during gait with incorporation of more dynamic head turns.  Pt tolerated well with only 1-2 LOB during self ball tossing with mod A to recover but pt reporting no dizziness with any exercises today.  Reports continued difficulty with exercises at home that include eyes closed and tandem gait; will continue to address.     PT Treatment/Interventions ADLs/Self Care Home Management;Therapeutic activities;Therapeutic exercise;Balance training;Neuromuscular re-education;Vestibular;Patient/family education;Functional mobility training;Gait training;Stair  training;DME Instruction   PT Next Visit Plan Continue to work on dual task during gait/carrying objects during gait to simulate lunch tray, head turns with gait.  balance with eyes closed.  Tandem gait on compliant surface.   Consulted and Agree with Plan of Care Patient      Patient will benefit from skilled therapeutic intervention in order to improve the following deficits and impairments:  Abnormal gait, Decreased balance, Difficulty walking, Dizziness, Postural dysfunction, Decreased mobility, Impaired flexibility  Visit Diagnosis: Other abnormalities of gait and mobility  Dizziness and giddiness  Unsteadiness on feet     Problem List Patient Active Problem List   Diagnosis Date Noted  . Stroke ([place]) 11/30/2016  . Essential hypertension 01/16/2015  . S/p cadaver renal transplant 11/06/2012  . Chronic kidney disease (CKD), stage IV (severe) ([place]) 03/18/2012  . BENIGN PROSTATIC HYPERTROPHY 04/18/2008  . EXTRINSIC ASTHMA, UNSPECIFIED 12/19/2007  . PSA, INCREASED 12/09/2007  . Hypothyroidism 08/12/2007  . Bipolar disorder ([place]) 07/27/2007  . Hyperlipidemia 07/07/2007  . Gout 07/07/2007  . HYPERTENSION 07/07/2007  . GERD 07/07/2007   Raylene Everts, PT, DPT 01/18/17    11:53 AM   [facility] [address] [place], [place], [address] Phone: [phone]   Fax:  [phone]  Name: DAQUANN MERRIOTT MRN: [id number] Date of Birth: Oct 10, 1936

## 2017-01-19 DIAGNOSIS — D899 Disorder involving the immune mechanism, unspecified: Secondary | ICD-10-CM | POA: Diagnosis not present

## 2017-01-19 DIAGNOSIS — E669 Obesity, unspecified: Secondary | ICD-10-CM | POA: Diagnosis not present

## 2017-01-19 DIAGNOSIS — H259 Unspecified age-related cataract: Secondary | ICD-10-CM | POA: Diagnosis not present

## 2017-01-19 DIAGNOSIS — Z599 Problem related to housing and economic circumstances, unspecified: Secondary | ICD-10-CM | POA: Diagnosis not present

## 2017-01-19 DIAGNOSIS — Z992 Dependence on renal dialysis: Secondary | ICD-10-CM | POA: Diagnosis not present

## 2017-01-19 DIAGNOSIS — Z87891 Personal history of nicotine dependence: Secondary | ICD-10-CM | POA: Diagnosis not present

## 2017-01-19 DIAGNOSIS — L509 Urticaria, unspecified: Secondary | ICD-10-CM | POA: Diagnosis not present

## 2017-01-19 DIAGNOSIS — I120 Hypertensive chronic kidney disease with stage 5 chronic kidney disease or end stage renal disease: Secondary | ICD-10-CM | POA: Diagnosis not present

## 2017-01-19 DIAGNOSIS — Z940 Kidney transplant status: Secondary | ICD-10-CM | POA: Diagnosis not present

## 2017-01-19 DIAGNOSIS — Z7902 Long term (current) use of antithrombotics/antiplatelets: Secondary | ICD-10-CM | POA: Diagnosis not present

## 2017-01-19 DIAGNOSIS — Z0000 Encounter for general adult medical examination without abnormal findings: Secondary | ICD-10-CM | POA: Diagnosis not present

## 2017-01-19 DIAGNOSIS — Z9181 History of falling: Secondary | ICD-10-CM | POA: Diagnosis not present

## 2017-01-19 DIAGNOSIS — K219 Gastro-esophageal reflux disease without esophagitis: Secondary | ICD-10-CM | POA: Diagnosis not present

## 2017-01-19 DIAGNOSIS — Z6830 Body mass index (BMI) 30.0-30.9, adult: Secondary | ICD-10-CM | POA: Diagnosis not present

## 2017-01-19 DIAGNOSIS — E039 Hypothyroidism, unspecified: Secondary | ICD-10-CM | POA: Diagnosis not present

## 2017-01-19 DIAGNOSIS — E7800 Pure hypercholesterolemia, unspecified: Secondary | ICD-10-CM | POA: Diagnosis not present

## 2017-01-19 DIAGNOSIS — R69 Illness, unspecified: Secondary | ICD-10-CM | POA: Diagnosis not present

## 2017-01-21 ENCOUNTER — Encounter: Payer: Self-pay | Admitting: Physical Therapy

## 2017-01-21 ENCOUNTER — Ambulatory Visit: Payer: Medicare HMO | Admitting: Physical Therapy

## 2017-01-21 VITALS — HR 80

## 2017-01-21 DIAGNOSIS — H5203 Hypermetropia, bilateral: Secondary | ICD-10-CM | POA: Diagnosis not present

## 2017-01-21 DIAGNOSIS — R42 Dizziness and giddiness: Secondary | ICD-10-CM

## 2017-01-21 DIAGNOSIS — R2689 Other abnormalities of gait and mobility: Secondary | ICD-10-CM | POA: Diagnosis not present

## 2017-01-21 DIAGNOSIS — H2513 Age-related nuclear cataract, bilateral: Secondary | ICD-10-CM | POA: Diagnosis not present

## 2017-01-21 DIAGNOSIS — R2681 Unsteadiness on feet: Secondary | ICD-10-CM

## 2017-01-21 NOTE — Therapy (Signed)
[facility] [address] [place], [place], [address] Phone: [phone]   Fax:  [phone]  Physical Therapy Treatment  Patient Details  Name: Curtis Jimenez MRN: [id number] Date of Birth: 03-Jan-1936 Referring Provider: Ricardo Jericho, MD  Encounter Date: 01/21/2017      PT End of Session - 01/21/17 1111    Visit Number 6   Number of Visits 16   Date for PT Re-Evaluation 02/28/17   Authorization Type G code every 10th visit ($40 copay)   PT Start Time 1103   PT Stop Time 1145   PT Time Calculation (min) 42 min   Activity Tolerance Patient tolerated treatment well   Behavior During Therapy [facility] for tasks assessed/performed      Past Medical History:  Diagnosis Date  . Anemia   . Arthritis   . Basal cell carcinoma    neck (skin MD 2X per year)  . Bipolar disorder ([place])   . BPH (benign prostatic hyperplasia)    with elevated PSA; followed by Dr. Rosana Hoes at [facility]  . Cataract   . Chronic renal insufficiency, stage III (moderate)    Last f/u with neph 07/06/16: Cr stable at 1.57 (GFR 42)  . CVA (cerebral vascular accident) ([place]) 11/2016   Pontine (imaging neg), TPA given.  Pt discharged on plavix.  Carotid dopplers ok, echocardiogram ok.  Residual deficit: vertigo, nystagmus, R facial droop.  . Gallbladder polyp 2015   Asymptomatic  . GERD (gastroesophageal reflux disease)   . Gout   . Hyperlipidemia   . Hypertension   . Hypothyroidism   . Restless legs syndrome   . S/p cadaver renal transplant 2013   Secondary to HTN +lithium toxicity over 30 yrs caused kidney destruction [facility] transplant MDs)    Past Surgical History:  Procedure Laterality Date  . AV FISTULA PLACEMENT  04/08/2012   Procedure: ARTERIOVENOUS (AV) FISTULA CREATION;  Surgeon: Angelia Mould, MD;  Location: [facility] OR;  Service: Vascular;  Laterality: Left;  Creation of left brachial cephalic arteriovenous fistula  .  COLONOSCOPY  2013   Normal except diverticulosis (recall 10 yrs)  . dilation for GERD    . KIDNEY TRANSPLANT  11/06/12   [place] (cadaveric)  . PROSTATE BIOPSY  2011   no malignancy  . TRANSTHORACIC ECHOCARDIOGRAM  11/2016   Normal LV systolic fxn, EF 53-74%.  Grade I DD.  Mild aortic root dilatation, mild MV regurg.    Vitals:   01/21/17 1112  Pulse: 80        Subjective Assessment - 01/21/17 1112    Subjective Pt just had eyes dilated; reports going to [facility] lecture yesterday without any issues.  Feels fatigued today though.  No issues with HEP.   Patient is accompained by: Family member   Patient Stated Goals Focus on turning.  Pt would still like to have more confidence with dual task in standing-standing at check out counter and managing debit card/wallet, take communion in standing without fear of falling   Currently in Pain? No/denies           [facility] Adult PT Treatment/Exercise - 01/21/17 1117      Ambulation/Gait   Ambulation/Gait Assistance 5: Supervision   Ambulation/Gait Assistance Details Performed higher level gait training/dual tasking combined with head movements placing, carrying and removing cones from plate while ambulating through crowded environment to simulate carrying lunch tray through crowded restaurant   Ambulation Distance (Feet) 200 Feet   Assistive device None  Ambulation Surface Level;Indoor     Knee/Hip Exercises: Aerobic   Elliptical x 3 minutes with cues for more upright gaze; fatigued quickly; random resistance; pt requested to cease due to LE fatigue.  Assessed HR after Elliptical             Balance Exercises - 01/21/17 1131      Balance Exercises: Standing   Tandem Stance Foam/compliant surface;Upper extremity support 1;4 reps  with head movements up/down, side to side x 10 reps each   Tandem Gait Forward;Upper extremity support;Foam/compliant surface;4 reps;Other (comment)  without head turns x 4, with vertical/horz head turns x  4           PT Education - 01/21/17 1152    Education provided Yes   Education Details Pt reporting hitting a plateau on HEP; will review next visit.  Continued to discuss energy conservation as pt may go to lunch and movie today.  Pt also asking questions about return to driving-deferred to PCP at appointment next week.   Person(s) Educated Patient   Methods Explanation   Comprehension Verbalized understanding          PT Short Term Goals - 01/04/17 1244      PT SHORT TERM GOAL #1   Title The patient will return demo HEP independently with written instruction.   Baseline Target date 01/30/2017   Status On-going     PT SHORT TERM GOAL #2   Title The patient will improve Berg from 40/56 up to 45/56 to demo dec'ing risk for falls.   Baseline Target date 01/30/2017   Status On-going     PT SHORT TERM GOAL #3   Title The patient will ambulate household surfaces without a device independently x 300 ft in clinic (and per subjective report for home).   Baseline Target date 01/30/2017   Status On-going     PT SHORT TERM GOAL #4   Title The patient will improve gait speed without device from 2.7 ft/sec to > or equal to 3.1 ft/sec to demo increasing mobility for community activities.   Baseline Target date 01/30/2017   Status On-going     PT SHORT TERM GOAL #5   Title The patient will ambulate in busy environment carrying a plate (with SPC if needed) in order to return to weekly lunch outing with friends.   Baseline Target date 01/30/2017   Status On-going     Additional Short Term Goals   Additional Short Term Goals Yes     PT SHORT TERM GOAL #6   Title The patient will be further assessed on FGA to establish baseline for LTG.   Baseline Target date 01/30/2017   Status Achieved     PT SHORT TERM GOAL #7   Title Pt will improve balance and decrease falls risk during dynamic gait as indicated by FGA score of >24/30   Baseline FGA: 15/30 without AD; Target Date 01/30/2017   Status New            PT Long Term Goals - 01/04/17 1247      PT LONG TERM GOAL #1   Title The patient will return to [facility] routine with guidance from PT on appropriate activities.   Baseline Target date 02/28/17   Time 8   Period Weeks   Status On-going     PT LONG TERM GOAL #2   Title The patient will improve Berg score from 40/56 to > or equal to 49/56 to demonstrate improving high level  balance.   Baseline Target date 02/28/17   Status On-going     PT LONG TERM GOAL #3   Title The patient will improve TUG from 14.34 seconds to < or equal to 13 seconds without a device to demo dec'ing risk for falls.   Baseline Target date 02/28/17   Status On-going     PT LONG TERM GOAL #4   Title The patient will negotiate 4 steps (x 3 reps) without handrails with reciprocal pattern independently.    Baseline Target date 02/28/17   Status On-going     PT LONG TERM GOAL #5   Title The patient will return to community ambulation without a device independently including level/unlevel surfaces (grass, curbs, inclines).   Baseline Target date 02/28/17   Status On-going     PT LONG TERM GOAL #6   Title The patient will improve FGA from 10 points over established baseline (see STGs)   Baseline Target date 02/28/17   Time 8   Period Weeks   Status Revised     PT LONG TERM GOAL #7   Title Improve Neuro QOL: LE by > or equal to 15% (baseline is 44.7%).   Baseline Target date 02/28/17   Status On-going           Plan - 01/21/17 1153    Clinical Impression Statement PT session with focus on endurance training with some vestibular input on Elliptical-pt tolerated well without vertigo but did fatigue quickly.  Pt continues to progress with dynamic gait/balance and vestibular exercises including compliant surfaces and head turns in various directions.  Simulated carrying tray with cones on plate to assess balance safety in restaurant setting-pt performed with supervision but continues to have difficulty with dual  tasking-had to cease conversation to perform safely.  Will continue to address and work towards pt increasing community outings.   Rehab Potential Good   PT Treatment/Interventions ADLs/Self Care Home Management;Therapeutic activities;Therapeutic exercise;Balance training;Neuromuscular re-education;Vestibular;Patient/family education;Functional mobility training;Gait training;Stair training;DME Instruction   PT Next Visit Plan Bring HEP-review each exercise-progress or adjust as needed.  Include endurance on Elliptical.  Changes in gait speed   Consulted and Agree with Plan of Care Patient      Patient will benefit from skilled therapeutic intervention in order to improve the following deficits and impairments:  Abnormal gait, Decreased balance, Difficulty walking, Dizziness, Postural dysfunction, Decreased mobility, Impaired flexibility  Visit Diagnosis: Other abnormalities of gait and mobility  Dizziness and giddiness  Unsteadiness on feet     Problem List Patient Active Problem List   Diagnosis Date Noted  . Stroke ([place]) 11/30/2016  . Essential hypertension 01/16/2015  . S/p cadaver renal transplant 11/06/2012  . Chronic kidney disease (CKD), stage IV (severe) ([place]) 03/18/2012  . BENIGN PROSTATIC HYPERTROPHY 04/18/2008  . EXTRINSIC ASTHMA, UNSPECIFIED 12/19/2007  . PSA, INCREASED 12/09/2007  . Hypothyroidism 08/12/2007  . Bipolar disorder ([place]) 07/27/2007  . Hyperlipidemia 07/07/2007  . Gout 07/07/2007  . HYPERTENSION 07/07/2007  . GERD 07/07/2007    Raylene Everts, PT, DPT 01/21/17    12:05 PM   [facility] [address] [place], [place], [address] Phone: [phone]   Fax:  [phone]  Name: Curtis Jimenez MRN: [id number] Date of Birth: 12-11-1936

## 2017-01-25 ENCOUNTER — Ambulatory Visit: Payer: Medicare HMO | Admitting: Physical Therapy

## 2017-01-25 ENCOUNTER — Encounter: Payer: Self-pay | Admitting: Physical Therapy

## 2017-01-25 DIAGNOSIS — R2681 Unsteadiness on feet: Secondary | ICD-10-CM

## 2017-01-25 DIAGNOSIS — R2689 Other abnormalities of gait and mobility: Secondary | ICD-10-CM | POA: Diagnosis not present

## 2017-01-25 DIAGNOSIS — R42 Dizziness and giddiness: Secondary | ICD-10-CM

## 2017-01-25 NOTE — Therapy (Signed)
[facility] [address] [place], [place], [address] Phone: [phone]   Fax:  [phone]  Physical Therapy Treatment  Patient Details  Name: Curtis Jimenez MRN: [id number] Date of Birth: 1936/06/05 Referring Provider: Ricardo Jericho, MD  Encounter Date: 01/25/2017      PT End of Session - 01/25/17 1306    Visit Number 7   Number of Visits 16   Date for PT Re-Evaluation 02/28/17   Authorization Type G code every 10th visit ($40 copay)   PT Start Time 1103   PT Stop Time 1147   PT Time Calculation (min) 44 min   Activity Tolerance Patient tolerated treatment well   Behavior During Therapy [facility] for tasks assessed/performed      Past Medical History:  Diagnosis Date  . Anemia   . Arthritis   . Basal cell carcinoma    neck (skin MD 2X per year)  . Bipolar disorder ([place])   . BPH (benign prostatic hyperplasia)    with elevated PSA; followed by Dr. Rosana Hoes at [facility]  . Cataract   . Chronic renal insufficiency, stage III (moderate)    Last f/u with neph 07/06/16: Cr stable at 1.57 (GFR 42)  . CVA (cerebral vascular accident) ([place]) 11/2016   Pontine (imaging neg), TPA given.  Pt discharged on plavix.  Carotid dopplers ok, echocardiogram ok.  Residual deficit: vertigo, nystagmus, R facial droop.  . Gallbladder polyp 2015   Asymptomatic  . GERD (gastroesophageal reflux disease)   . Gout   . Hyperlipidemia   . Hypertension   . Hypothyroidism   . Restless legs syndrome   . S/p cadaver renal transplant 2013   Secondary to HTN +lithium toxicity over 30 yrs caused kidney destruction [facility] transplant MDs)    Past Surgical History:  Procedure Laterality Date  . AV FISTULA PLACEMENT  04/08/2012   Procedure: ARTERIOVENOUS (AV) FISTULA CREATION;  Surgeon: Angelia Mould, MD;  Location: [facility] OR;  Service: Vascular;  Laterality: Left;  Creation of left brachial cephalic arteriovenous fistula  .  COLONOSCOPY  2013   Normal except diverticulosis (recall 10 yrs)  . dilation for GERD    . KIDNEY TRANSPLANT  11/06/12   [place] (cadaveric)  . PROSTATE BIOPSY  2011   no malignancy  . TRANSTHORACIC ECHOCARDIOGRAM  11/2016   Normal LV systolic fxn, EF 24-82%.  Grade I DD.  Mild aortic root dilatation, mild MV regurg.    There were no vitals filed for this visit.      Subjective Assessment - 01/25/17 1106    Subjective Pt reports going to movies, lunch and play on Friday and through the weekend; no issues afterwards but did cut down on performing exercises those days to conserve energy.   Patient is accompained by: Family member   Currently in Pain? No/denies          [facility] Adult PT Treatment/Exercise - 01/25/17 1109      Knee/Hip Exercises: Standing   Hip Abduction Stengthening;Both;Knee bent;5 sets  standing clamshells with theraband; little effect         Vestibular Treatment/Exercise - 01/25/17 1124      X1 Viewing Horizontal   Foot Position apart, solid surface   Reps 1   Comments increased time to 60, mild increase in symptoms     X1 Viewing Vertical   Foot Position apart, solid surface   Reps 1   Comments increased time to 60 seconds,  Balance Exercises - 01/25/17 1129      Balance Exercises: Standing   Standing Eyes Opened Narrow base of support (BOS);Head turns;Solid surface;5 reps;Other (comment)  tandem stance, L and R, vertical and horz head turns   Standing Eyes Closed Narrow base of support (BOS);Head turns;Solid surface;5 reps           PT Education - 01/25/17 1305    Education provided Yes   Education Details Upgraded HEP; advised to keep hamstring, hip ABD, tandem gait exercises the same   Person(s) Educated Patient   Methods Demonstration;Explanation;Handout   Comprehension Verbalized understanding;Returned demonstration          PT Short Term Goals - 01/04/17 1244      PT SHORT TERM GOAL #1   Title The patient  will return demo HEP independently with written instruction.   Baseline Target date 01/30/2017   Status On-going     PT SHORT TERM GOAL #2   Title The patient will improve Berg from 40/56 up to 45/56 to demo dec'ing risk for falls.   Baseline Target date 01/30/2017   Status On-going     PT SHORT TERM GOAL #3   Title The patient will ambulate household surfaces without a device independently x 300 ft in clinic (and per subjective report for home).   Baseline Target date 01/30/2017   Status On-going     PT SHORT TERM GOAL #4   Title The patient will improve gait speed without device from 2.7 ft/sec to > or equal to 3.1 ft/sec to demo increasing mobility for community activities.   Baseline Target date 01/30/2017   Status On-going     PT SHORT TERM GOAL #5   Title The patient will ambulate in busy environment carrying a plate (with SPC if needed) in order to return to weekly lunch outing with friends.   Baseline Target date 01/30/2017   Status On-going     Additional Short Term Goals   Additional Short Term Goals Yes     PT SHORT TERM GOAL #6   Title The patient will be further assessed on FGA to establish baseline for LTG.   Baseline Target date 01/30/2017   Status Achieved     PT SHORT TERM GOAL #7   Title Pt will improve balance and decrease falls risk during dynamic gait as indicated by FGA score of >24/30   Baseline FGA: 15/30 without AD; Target Date 01/30/2017   Status New           PT Long Term Goals - 01/04/17 1247      PT LONG TERM GOAL #1   Title The patient will return to [facility] routine with guidance from PT on appropriate activities.   Baseline Target date 02/28/17   Time 8   Period Weeks   Status On-going     PT LONG TERM GOAL #2   Title The patient will improve Berg score from 40/56 to > or equal to 49/56 to demonstrate improving high level balance.   Baseline Target date 02/28/17   Status On-going     PT LONG TERM GOAL #3   Title The patient will improve TUG from  14.34 seconds to < or equal to 13 seconds without a device to demo dec'ing risk for falls.   Baseline Target date 02/28/17   Status On-going     PT LONG TERM GOAL #4   Title The patient will negotiate 4 steps (x 3 reps) without handrails with reciprocal pattern independently.  Baseline Target date 02/28/17   Status On-going     PT LONG TERM GOAL #5   Title The patient will return to community ambulation without a device independently including level/unlevel surfaces (grass, curbs, inclines).   Baseline Target date 02/28/17   Status On-going     PT LONG TERM GOAL #6   Title The patient will improve FGA from 10 points over established baseline (see STGs)   Baseline Target date 02/28/17   Time 8   Period Weeks   Status Revised     PT LONG TERM GOAL #7   Title Improve Neuro QOL: LE by > or equal to 15% (baseline is 44.7%).   Baseline Target date 02/28/17   Status On-going               Plan - 01/25/17 1307    Clinical Impression Statement Treatment session to focus on review of HEP and upgrade exercises due to pt progress with exercises at home and activity level in general at home and in community.  Pt tolerated all exercises well.  Will continue to benefit from PT services to continue to progress functional mobility independence and decrease falls risk.   Rehab Potential Good   PT Treatment/Interventions ADLs/Self Care Home Management;Therapeutic activities;Therapeutic exercise;Balance training;Neuromuscular re-education;Vestibular;Patient/family education;Functional mobility training;Gait training;Stair training;DME Instruction   PT Next Visit Plan Assess STG and pt progress; discuss further appointments-continue 2x/wk or decrease to 1x/week   Consulted and Agree with Plan of Care Patient      Patient will benefit from skilled therapeutic intervention in order to improve the following deficits and impairments:  Abnormal gait, Decreased balance, Difficulty walking, Dizziness,  Postural dysfunction, Decreased mobility, Impaired flexibility  Visit Diagnosis: Other abnormalities of gait and mobility  Dizziness and giddiness  Unsteadiness on feet     Problem List Patient Active Problem List   Diagnosis Date Noted  . Stroke ([place]) 11/30/2016  . Essential hypertension 01/16/2015  . S/p cadaver renal transplant 11/06/2012  . Chronic kidney disease (CKD), stage IV (severe) ([place]) 03/18/2012  . BENIGN PROSTATIC HYPERTROPHY 04/18/2008  . EXTRINSIC ASTHMA, UNSPECIFIED 12/19/2007  . PSA, INCREASED 12/09/2007  . Hypothyroidism 08/12/2007  . Bipolar disorder ([place]) 07/27/2007  . Hyperlipidemia 07/07/2007  . Gout 07/07/2007  . HYPERTENSION 07/07/2007  . GERD 07/07/2007   Raylene Everts, PT, DPT 01/25/17    1:11 PM    [facility] [address] [place], [place], [address] Phone: [phone]   Fax:  [phone]  Name: BIRDIE BEVERIDGE MRN: [id number] Date of Birth: 1936-08-13

## 2017-01-25 NOTE — Patient Instructions (Signed)
Home Exercises 01/25/2017  1) Keep hamstring stretch the same, keep sidelying hip exercises the same with blue theraband, keep heel-toe walking at counter the same  2) Figure 8 walking-add up/down head turns  3) Gaze Stabilization - Tip Card  1.Target must remain in focus, not blurry, and appear stationary while head is in motion. 2.Perform exercises with small head movements (45 to either side of midline). 3.Increase speed of head motion so long as target is in focus. 4.If you wear eyeglasses, be sure you can see target through lens (therapist will give specific instructions for bifocal / progressive lenses). 5.These exercises may provoke dizziness or nausea. Work through these symptoms. If too dizzy, slow head movement slightly. Rest between each exercise. 6.Exercises demand concentration; avoid distractions. 7.For safety, perform standing exercises close to a counter, wall, corner, or next to someone.  Copyright  VHI. All rights reserved.   Gaze Stabilization - Standing Feet Apart   Feet shoulder width apart, keeping eyes on target on wall 3 feet away, tilt head down slightly and move head side to side for 60 seconds. Repeat while moving head up and down for 60 seconds. Put feet together and repeat each head direction 30 seconds each. Do 2-3 sessions per day.   Copyright  VHI. All rights reserved.   Balance: Eyes Closed - Bilateral (Varied Surfaces)    Stand, feet together, close eyes. Maintain balance 10 seconds.  Hold back of chair if necessary      With eyes closed and feet together, move head slowly, up and down. 5 times Up and Down, 5 times side to side. Do 2-3 sessions per day.   Balance: Eyes Open   Feet Partial Heel-Toe, Head Motion - Eyes Open    With eyes open, right foot partially in front of the other, move head slowly: up and down 5 times, side to side 5 times, repeat with left foot forward.  Hold chair if necessary.  Do 2-3 sessions per  day.  Copyright  VHI. All rights reserved.

## 2017-01-26 DIAGNOSIS — R69 Illness, unspecified: Secondary | ICD-10-CM | POA: Diagnosis not present

## 2017-01-28 ENCOUNTER — Ambulatory Visit (INDEPENDENT_AMBULATORY_CARE_PROVIDER_SITE_OTHER): Payer: Medicare HMO | Admitting: Family Medicine

## 2017-01-28 ENCOUNTER — Encounter: Payer: Self-pay | Admitting: Family Medicine

## 2017-01-28 ENCOUNTER — Ambulatory Visit: Payer: Medicare HMO | Attending: Family Medicine | Admitting: Rehabilitative and Restorative Service Providers"

## 2017-01-28 VITALS — BP 113/71 | HR 73 | Temp 97.0°F | Resp 16 | Wt 211.0 lb

## 2017-01-28 DIAGNOSIS — E039 Hypothyroidism, unspecified: Secondary | ICD-10-CM

## 2017-01-28 DIAGNOSIS — Z8673 Personal history of transient ischemic attack (TIA), and cerebral infarction without residual deficits: Secondary | ICD-10-CM

## 2017-01-28 DIAGNOSIS — R42 Dizziness and giddiness: Secondary | ICD-10-CM | POA: Diagnosis present

## 2017-01-28 DIAGNOSIS — R2681 Unsteadiness on feet: Secondary | ICD-10-CM

## 2017-01-28 DIAGNOSIS — E7800 Pure hypercholesterolemia, unspecified: Secondary | ICD-10-CM

## 2017-01-28 DIAGNOSIS — K219 Gastro-esophageal reflux disease without esophagitis: Secondary | ICD-10-CM

## 2017-01-28 DIAGNOSIS — R2689 Other abnormalities of gait and mobility: Secondary | ICD-10-CM | POA: Diagnosis not present

## 2017-01-28 DIAGNOSIS — I10 Essential (primary) hypertension: Secondary | ICD-10-CM

## 2017-01-28 LAB — TSH: TSH: 1.07 u[IU]/mL (ref 0.35–4.50)

## 2017-01-28 MED ORDER — CLOPIDOGREL BISULFATE 75 MG PO TABS: 75.0000 mg | ORAL_TABLET | Freq: Every day | ORAL | 3 refills | Status: DC

## 2017-01-28 NOTE — Therapy (Signed)
[facility] [address] [place], [place], [address] Phone: [phone]   Fax:  [phone]  Physical Therapy Treatment  Patient Details  Name: Curtis Jimenez MRN: [id number] Date of Birth: August 16, 1936 Referring Provider: Ricardo Jericho, MD  Encounter Date: 01/28/2017      PT End of Session - 01/28/17 1152    Visit Number 8   Number of Visits 16   Date for PT Re-Evaluation 02/28/17   Authorization Type G code every 10th visit ($40 copay)   PT Start Time 1102   PT Stop Time 1156   PT Time Calculation (min) 54 min   Equipment Utilized During Treatment Gait belt   Activity Tolerance Patient tolerated treatment well   Behavior During Therapy WFL for tasks assessed/performed      Past Medical History:  Diagnosis Date  . Anemia   . Arthritis   . Basal cell carcinoma    neck (skin MD 2X per year)  . Bipolar disorder ([place])   . BPH (benign prostatic hyperplasia)    with elevated PSA; followed by Dr. Rosana Hoes at [facility]  . Cataract   . Chronic renal insufficiency, stage III (moderate)    Last f/u with neph 07/06/16: Cr stable at 1.57 (GFR 42)  . CVA (cerebral vascular accident) ([place]) 11/2016   Pontine (imaging neg), TPA given.  Pt discharged on plavix.  Carotid dopplers ok, echocardiogram ok.  Residual deficit: vertigo, nystagmus, R facial droop.  . Gallbladder polyp 2015   Asymptomatic  . GERD (gastroesophageal reflux disease)   . Gout   . Hyperlipidemia   . Hypertension   . Hypothyroidism   . Restless legs syndrome   . S/p cadaver renal transplant 2013   Secondary to HTN +lithium toxicity over 30 yrs caused kidney destruction [facility] transplant MDs)    Past Surgical History:  Procedure Laterality Date  . AV FISTULA PLACEMENT  04/08/2012   Procedure: ARTERIOVENOUS (AV) FISTULA CREATION;  Surgeon: Angelia Mould, MD;  Location: [facility] OR;  Service: Vascular;  Laterality: Left;  Creation of left  brachial cephalic arteriovenous fistula  . COLONOSCOPY  2013   Normal except diverticulosis (recall 10 yrs)  . dilation for GERD    . KIDNEY TRANSPLANT  11/06/12   [place] (cadaveric)  . PROSTATE BIOPSY  2011   no malignancy  . TRANSTHORACIC ECHOCARDIOGRAM  11/2016   Normal LV systolic fxn, EF 75-10%.  Grade I DD.  Mild aortic root dilatation, mild MV regurg.    There were no vitals filed for this visit.      Subjective Assessment - 01/28/17 1106    Subjective The patient is walking without device in the home and uses cane in the community.  Patient feels he has returned to 80% of prior level of functioning.   Patient Stated Goals Focus on turning.  Pt would still like to have more confidence with dual task in standing-standing at check out counter and managing debit card/wallet, take communion in standing without fear of falling   Currently in Pain? No/denies            [facility] PT Assessment - 01/28/17 1108      Ambulation/Gait   Ambulation/Gait Yes   Ambulation/Gait Assistance 7: Independent   Assistive device None   Gait velocity 3.11 ft/sec    Stairs Yes   Stairs Assistance 6: Modified independent (Device/Increase time)  slowed pace descending   Stair Management Technique Alternating pattern;No rails   Number of Stairs  4     Standardized Balance Assessment   Standardized Balance Assessment Berg Balance Test;Timed Up and Go Test     Berg Balance Test   Sit to Stand Able to stand without using hands and stabilize independently   Standing Unsupported Able to stand safely 2 minutes   Sitting with Back Unsupported but Feet Supported on Floor or Stool Able to sit safely and securely 2 minutes   Stand to Sit Sits safely with minimal use of hands   Transfers Able to transfer safely, minor use of hands   Standing Unsupported with Eyes Closed Able to stand 10 seconds safely   Standing Ubsupported with Feet Together Able to place feet together independently and stand 1 minute  safely   From Standing, Reach Forward with Outstretched Arm Can reach confidently >25 cm (10")   From Standing Position, Pick up Object from Floor Able to pick up shoe safely and easily   From Standing Position, Turn to Look Behind Over each Shoulder Turn sideways only but maintains balance   Turn 360 Degrees Able to turn 360 degrees safely but slowly   Standing Unsupported, Alternately Place Feet on Step/Stool Able to stand independently and safely and complete 8 steps in 20 seconds   Standing Unsupported, One Foot in Front Able to take small step independently and hold 30 seconds   Standing on One Leg Able to lift leg independently and hold equal to or more than 3 seconds   Total Score 48   Berg comment: 48/56 indicating dec'd risk for falls.      Functional Gait  Assessment   Gait assessed  Yes   Gait Level Surface Walks 20 ft in less than 7 sec but greater than 5.5 sec, uses assistive device, slower speed, mild gait deviations, or deviates 6-10 in outside of the 12 in walkway width.   Change in Gait Speed Able to change speed, demonstrates mild gait deviations, deviates 6-10 in outside of the 12 in walkway width, or no gait deviations, unable to achieve a major change in velocity, or uses a change in velocity, or uses an assistive device.  only demonstrates mild changes in speed   Gait with Horizontal Head Turns Performs head turns smoothly with slight change in gait velocity (eg, minor disruption to smooth gait path), deviates 6-10 in outside 12 in walkway width, or uses an assistive device.   Gait with Vertical Head Turns Performs task with slight change in gait velocity (eg, minor disruption to smooth gait path), deviates 6 - 10 in outside 12 in walkway width or uses assistive device   Gait and Pivot Turn Pivot turns safely in greater than 3 sec and stops with no loss of balance, or pivot turns safely within 3 sec and stops with mild imbalance, requires small steps to catch balance.   Step  Over Obstacle Is able to step over one shoe box (4.5 in total height) but must slow down and adjust steps to clear box safely. May require verbal cueing.   Gait with Narrow Base of Support Ambulates 7-9 steps.   Gait with Eyes Closed Walks 20 ft, slow speed, abnormal gait pattern, evidence for imbalance, deviates 10-15 in outside 12 in walkway width. Requires more than 9 sec to ambulate 20 ft.   Ambulating Backwards Walks 20 ft, uses assistive device, slower speed, mild gait deviations, deviates 6-10 in outside 12 in walkway width.   Steps Alternating feet, no rail.   Total Score 19   FGA  comment: 19/30                     [facility] Adult PT Treatment/Exercise - 01/28/17 1108      Ambulation/Gait   Gait Comments Dynamic gait activities including fast paced walking with SBA without device, intermittent quick stops and restarts, transitions from slow/fast, reading cards looking left and right at varying heights with CGA, backwards walking, 180 degree turns with visual spotting.     Self-Care   Self-Care Other Self-Care Comments   Other Self-Care Comments  Discussed plan of care and return to gym activities.  Pre-morbidly, the patient was participating in walking at [facility] and working out on his own.  At this time, he feels he could return to indoor track with [facility] and plans to check into tai chi.  PT and patient discussed strategies to participate in group exercise including standing near support surface like chair or wall.  Patient expresses ability to reduce to 1x/week.  He inquired about return to driving and recommended he f/u with Dr. Tomi Likens at 2/27 visit.     Therapeutic Activites    Therapeutic Activities Other Therapeutic Activities   Other Therapeutic Activities obstacle course negotiation with figure 8 cones, compliant surface negotiation, stepping over 8" height surfaces and up/onto compliant surface 4" high with SBA to CGA.       Neuro Re-ed    Neuro Re-ed Details  Performed  quarter turns moving eyes+head/ then body R and L with visual cues working on increasing speed of movement.  Performed rocker board activities with reaching, eyes closed alternating UE movement, eyes open with head motion requiring min A to regain balance; compliant surface standing with head motion; marching on mat; standing on ramp with eyes closed and while marching with CGA for safety.                 PT Education - 01/28/17 1221    Education provided Yes   Education Details updated patient on progress based on STGs/LTGS.  Discussed plan of care with goal to transition back to community routine.   Person(s) Educated Patient   Methods Explanation   Comprehension Verbalized understanding          PT Short Term Goals - 01/28/17 1108      PT SHORT TERM GOAL #1   Title The patient will return demo HEP independently with written instruction.   Baseline Target date 01/30/2017   Status Achieved     PT SHORT TERM GOAL #2   Title The patient will improve Berg from 40/56 up to 45/56 to demo dec'ing risk for falls.   Baseline 48/56 on 01/28/17   Status Achieved     PT SHORT TERM GOAL #3   Title The patient will ambulate household surfaces without a device independently x 300 ft in clinic (and per subjective report for home).   Baseline met on 01/28/17   Status Achieved     PT SHORT TERM GOAL #4   Title The patient will improve gait speed without device from 2.7 ft/sec to > or equal to 3.1 ft/sec to demo increasing mobility for community activities.   Baseline Patient ambulates 3.1 ft/sec on 01/28/17   Status Achieved     PT SHORT TERM GOAL #5   Title The patient will ambulate in busy environment carrying a plate (with SPC if needed) in order to return to weekly lunch outing with friends.   Baseline Met per subjective reports on 01/28/17  Status Achieved     PT SHORT TERM GOAL #6   Title The patient will be further assessed on FGA to establish baseline for LTG.   Baseline Target  date 01/30/2017   Status Achieved     PT SHORT TERM GOAL #7   Title Pt will improve balance and decrease falls risk during dynamic gait as indicated by FGA score of >24/30   Baseline Improved from 15/30 up to 19/30 on 01/28/17   Status Partially Met           PT Long Term Goals - 01/04/17 1247      PT LONG TERM GOAL #1   Title The patient will return to [facility] routine with guidance from PT on appropriate activities.   Baseline Target date 02/28/17   Time 8   Period Weeks   Status On-going     PT LONG TERM GOAL #2   Title The patient will improve Berg score from 40/56 to > or equal to 49/56 to demonstrate improving high level balance.   Baseline Target date 02/28/17   Status On-going     PT LONG TERM GOAL #3   Title The patient will improve TUG from 14.34 seconds to < or equal to 13 seconds without a device to demo dec'ing risk for falls.   Baseline Target date 02/28/17   Status On-going     PT LONG TERM GOAL #4   Title The patient will negotiate 4 steps (x 3 reps) without handrails with reciprocal pattern independently.    Baseline Target date 02/28/17   Status On-going     PT LONG TERM GOAL #5   Title The patient will return to community ambulation without a device independently including level/unlevel surfaces (grass, curbs, inclines).   Baseline Target date 02/28/17   Status On-going     PT LONG TERM GOAL #6   Title The patient will improve FGA from 10 points over established baseline (see STGs)   Baseline Target date 02/28/17   Time 8   Period Weeks   Status Revised     PT LONG TERM GOAL #7   Title Improve Neuro QOL: LE by > or equal to 15% (baseline is 44.7%).   Baseline Target date 02/28/17   Status On-going               Plan - 01/28/17 1222    Clinical Impression Statement The patient met 6/7 STGs not meeting FGA goal.  PT and patient discussed LTGs and focus of continuing to therapy to include:  speed of movement (especially with turns as able to do safely), quick  starts/stops, compliant surface negotiation, and return to community exercise.  Patient notes he prefers 1x/week so he can begin to attend [employer] and he also plans to participate in tai chi classes.    PT Frequency 1x / week   PT Duration 4 weeks   PT Treatment/Interventions ADLs/Self Care Home Management;Therapeutic activities;Therapeutic exercise;Balance training;Neuromuscular re-education;Vestibular;Patient/family education;Functional mobility training;Gait training;Stair training;DME Instruction   PT Next Visit Plan G CODE DUE IN 2 VISITS Compliant surfaces with eyes closed, speed during turns with visual cues, starts/stops and changes in speed, walking program   Consulted and Agree with Plan of Care Patient      Patient will benefit from skilled therapeutic intervention in order to improve the following deficits and impairments:  Abnormal gait, Decreased balance, Difficulty walking, Dizziness, Postural dysfunction, Decreased mobility, Impaired flexibility  Visit Diagnosis: Other abnormalities of gait and mobility  Dizziness  and giddiness  Unsteadiness on feet     Problem List Patient Active Problem List   Diagnosis Date Noted  . Stroke ([place]) 11/30/2016  . Essential hypertension 01/16/2015  . S/p cadaver renal transplant 11/06/2012  . Chronic kidney disease (CKD), stage IV (severe) ([place]) 03/18/2012  . BENIGN PROSTATIC HYPERTROPHY 04/18/2008  . EXTRINSIC ASTHMA, UNSPECIFIED 12/19/2007  . PSA, INCREASED 12/09/2007  . Hypothyroidism 08/12/2007  . Bipolar disorder ([place]) 07/27/2007  . Hyperlipidemia 07/07/2007  . Gout 07/07/2007  . HYPERTENSION 07/07/2007  . GERD 07/07/2007    Liahna Brickner 01/28/2017, 2:37 PM  [facility] [address] [place], [place], [address] Phone: [phone]   Fax:  [phone]  Name: Curtis Jimenez MRN: [id number] Date of Birth: Dec 15, 1936

## 2017-01-28 NOTE — Progress Notes (Signed)
OFFICE VISIT  01/28/2017   CC:  Chief Complaint  Patient presents with  . Follow-up    HTN   HPI:    Patient is a 81 y.o. Caucasian male who presents for 6 mo f/u HTN, HLD, hypothyroidism. Has CRI stage III followed by nephrologist.  He recently had pontine CVA and was briefly admitted to hosp and got TPA.  He got d/c'd home on plavix and had residual vertigo, nystagmus. Has been getting outpt rehab. Says things have been going slow but progressing.  He voices some concern over omeprazole and it's potential risk for kidney dysfunction when used long term. We discussed weening off this med.  HTN: home monitoring: Syst avg 140s, diast avg 70s, HR avg 75. He is in favor of leaving bp management as it currently is at this time.  HLD: takes simvastatin 20mg  qd, no side effects.  ROS: no focal weakness, no sensory complaints, no dysarthria or dysphagia.   Past Medical History:  Diagnosis Date  . Anemia   . Arthritis   . Basal cell carcinoma    neck (skin MD 2X per year)  . Bipolar disorder ([place])   . BPH (benign prostatic hyperplasia)    with elevated PSA; followed by Dr. Rosana Hoes at [facility]  . Cataract   . Chronic renal insufficiency, stage III (moderate)    Last f/u with neph 07/06/16: Cr stable at 1.57 (GFR 42)  . CVA (cerebral vascular accident) ([place]) 11/2016   Pontine (imaging neg), TPA given.  Pt discharged on plavix.  Carotid dopplers ok, echocardiogram ok.  Residual deficit: vertigo, nystagmus, R facial droop.  . Gallbladder polyp 2015   Asymptomatic  . GERD (gastroesophageal reflux disease)   . Gout   . Hyperlipidemia   . Hypertension   . Hypothyroidism   . Restless legs syndrome   . S/p cadaver renal transplant 2013   Secondary to HTN +lithium toxicity over 30 yrs caused kidney destruction [facility] transplant MDs)    Past Surgical History:  Procedure Laterality Date  . AV FISTULA PLACEMENT  04/08/2012   Procedure: ARTERIOVENOUS (AV) FISTULA CREATION;   Surgeon: Angelia Mould, MD;  Location: [facility] OR;  Service: Vascular;  Laterality: Left;  Creation of left brachial cephalic arteriovenous fistula  . COLONOSCOPY  2013   Normal except diverticulosis (recall 10 yrs)  . dilation for GERD    . KIDNEY TRANSPLANT  11/06/12   [place] (cadaveric)  . PROSTATE BIOPSY  2011   no malignancy  . TRANSTHORACIC ECHOCARDIOGRAM  11/2016   Normal LV systolic fxn, EF 22-97%.  Grade I DD.  Mild aortic root dilatation, mild MV regurg.    Outpatient Medications Prior to Visit  Medication Sig Dispense Refill  . acetaminophen (TYLENOL) 325 MG tablet Take 650 mg by mouth every 6 (six) hours as needed for mild pain.    Marland Kitchen allopurinol (ZYLOPRIM) 300 MG tablet Take 1 tablet (300 mg total) by mouth daily. 90 tablet 3  . carvedilol (COREG) 6.25 MG tablet Take 1 tablet (6.25 mg total) by mouth 2 (two) times daily with a meal. (Patient taking differently: Taking 2 tablets in the morning and 1 tablet in the evening) 180 tablet 3  . Cholecalciferol (VITAMIN D) 2000 UNITS CAPS Take 1 capsule by mouth daily.    . colchicine 0.6 MG tablet 2 tabs at onset of gout flare, then 1 tab one hour later, then starting the next day take 1 tab bid until gout flare is resolved 20 tablet 1  .  hydrOXYzine (ATARAX/VISTARIL) 25 MG tablet Take 10 mg by mouth 2 (two) times daily.     Marland Kitchen levothyroxine (SYNTHROID, LEVOTHROID) 25 MCG tablet Take 1 tablet (25 mcg total) by mouth daily. 90 tablet 3  . mycophenolate (MYFORTIC) 180 MG EC tablet Take 360 mg by mouth 2 (two) times daily.     . Omega-3 1000 MG CAPS Take 2,000 mg by mouth 2 (two) times daily.     Marland Kitchen omeprazole (PRILOSEC) 20 MG capsule Take 1 capsule (20 mg total) by mouth daily. 90 capsule 3  . risperiDONE (RISPERDAL) 1 MG tablet Take 0.5 mg by mouth daily. Take 1/2 tablet daily.     . simvastatin (ZOCOR) 20 MG tablet Take 10 mg by mouth daily.    Marland Kitchen sulfamethoxazole-trimethoprim (BACTRIM,SEPTRA) 400-80 MG tablet Take 1 tablet by mouth  every Monday, Wednesday, and Friday.    . tacrolimus (PROGRAF) 0.5 MG capsule Take 0.5 mg by mouth at bedtime. Also taking 1mg  in the morning    . tacrolimus (PROGRAF) 1 MG capsule Take 1 mg by mouth every morning. Also taking 0.5mg  at bedtime    . vitamin B-12 (CYANOCOBALAMIN) 100 MCG tablet Take 100 mcg by mouth daily.    . clopidogrel (PLAVIX) 75 MG tablet Take 1 tablet (75 mg total) by mouth daily. 90 tablet 3  . amLODipine (NORVASC) 5 MG tablet Take 5 mg by mouth daily.      No facility-administered medications prior to visit.     Allergies  Allergen Reactions  . Amantadine Hcl     REACTION: itching  . Chlorpromazine Hcl Other (See Comments)    REACTION: "fell flat on face"    ROS As per HPI  PE: Blood pressure 113/71, pulse 73, temperature 97 F (36.1 C), temperature source Temporal, resp. rate 16, weight 211 lb (95.7 kg), SpO2 95 %. Gen: Alert, well appearing.  Patient is oriented to person, place, time, and situation. AFFECT: pleasant, lucid thought and speech. ENT: no nystagmus. No facial asymmetry. CV: RRR, no m/r/g.   LUNGS: CTA bilat, nonlabored resps, good aeration in all lung fields. EXT: no clubbing, cyanosis, or edema.   LABS:  Lab Results  Component Value Date   TSH 0.751 01/31/2016   Lab Results  Component Value Date   WBC 5.8 12/05/2016   HGB 12.1 (L) 12/05/2016   HCT 37.8 (L) 12/05/2016   MCV 86.3 12/05/2016   PLT 190 12/05/2016   Lab Results  Component Value Date   CREATININE 1.60 (H) 12/05/2016   BUN 22 (H) 12/05/2016   NA 140 12/05/2016   K 3.9 12/05/2016   CL 108 12/05/2016   CO2 26 12/05/2016   Lab Results  Component Value Date   ALT 20 11/30/2016   AST 24 11/30/2016   ALKPHOS 53 11/30/2016   BILITOT 0.9 11/30/2016   Lab Results  Component Value Date   CHOL 120 12/01/2016   Lab Results  Component Value Date   HDL 42 12/01/2016   Lab Results  Component Value Date   LDLCALC 65 12/01/2016   Lab Results  Component Value  Date   TRIG 63 12/01/2016   Lab Results  Component Value Date   CHOLHDL 2.9 12/01/2016   Lab Results  Component Value Date   PSA 3.51 03/28/2008   PSA 4.83 (H) 12/02/2007   Lab Results  Component Value Date   HGBA1C 5.9 (H) 12/01/2016    IMPRESSION AND PLAN:  1) HTN; not ideal control but pt is hesitant to  get tighter control b/c of fear of not perfusing the brain enough in his current post-cva state.  I am ok with this, but if still not at goal of 135/80 at next f/u we'll have to take action to get b/c better controlled. Recent lytes/cr stable.  2) HLD: tolerating statin.  Lipids and AST/ALT good 11/2016.  3) Hypothyroidism: due for TSH monitoring today.  4) CRI stage III: followed by nephrologist locally and with transplant clinic. Recent Cr stable in hosp when he had his CVA 11/2016.  5) Pontine CVA: continues to gradually improve stability/less vertigo with rehab. Continue RF control + plavix.  6) Hx of GERD, with long term use of PPI: he will alternate H2 blocker qod with omeprazole qod for a few weeks before d/c use of omeprazole.  An After Visit Summary was printed and given to the patient.  FOLLOW UP: Return in about 4 months (around 05/28/2017) for routine chronic illness f/u.  Signed:  Crissie Sickles, MD           01/28/2017

## 2017-01-28 NOTE — Progress Notes (Signed)
Pre visit review using our clinic review tool, if applicable. No additional management support is needed unless otherwise documented below in the visit note. 

## 2017-02-01 ENCOUNTER — Ambulatory Visit: Payer: Medicare HMO | Admitting: Rehabilitative and Restorative Service Providers"

## 2017-02-01 ENCOUNTER — Other Ambulatory Visit: Payer: Self-pay | Admitting: Family Medicine

## 2017-02-03 ENCOUNTER — Ambulatory Visit: Payer: Medicare HMO | Admitting: Physical Therapy

## 2017-02-03 ENCOUNTER — Encounter: Payer: Self-pay | Admitting: Physical Therapy

## 2017-02-03 DIAGNOSIS — R2681 Unsteadiness on feet: Secondary | ICD-10-CM

## 2017-02-03 DIAGNOSIS — R2689 Other abnormalities of gait and mobility: Secondary | ICD-10-CM | POA: Diagnosis not present

## 2017-02-03 DIAGNOSIS — R42 Dizziness and giddiness: Secondary | ICD-10-CM

## 2017-02-03 NOTE — Therapy (Signed)
[facility] [address] [place], [place], [address] Phone: [phone]   Fax:  [phone]  Physical Therapy Treatment  Patient Details  Name: Curtis Jimenez MRN: [id number] Date of Birth: June 08, 1936 Referring Provider: Ricardo Jericho, MD  Encounter Date: 02/03/2017      PT End of Session - 02/03/17 1537    Visit Number 9   Number of Visits 16   Date for PT Re-Evaluation 02/28/17   Authorization Type G code every 10th visit ($40 copay)   PT Start Time 1445   PT Stop Time 1530   PT Time Calculation (min) 45 min   Activity Tolerance Patient tolerated treatment well   Behavior During Therapy [facility] for tasks assessed/performed      Past Medical History:  Diagnosis Date  . Anemia   . Arthritis   . Basal cell carcinoma    neck (skin MD 2X per year)  . Bipolar disorder ([place])   . BPH (benign prostatic hyperplasia)    with elevated PSA; followed by Dr. Rosana Hoes at [facility]  . Cataract   . Chronic renal insufficiency, stage III (moderate)    Last f/u with neph 07/06/16: Cr stable at 1.57 (GFR 42)  . CVA (cerebral vascular accident) ([place]) 11/2016   Pontine (imaging neg), TPA given.  Pt discharged on plavix.  Carotid dopplers ok, echocardiogram ok.  Residual deficit: vertigo, nystagmus, R facial droop.  . Gallbladder polyp 2015   Asymptomatic  . GERD (gastroesophageal reflux disease)   . Gout   . Hyperlipidemia   . Hypertension   . Hypothyroidism   . Restless legs syndrome   . S/p cadaver renal transplant 2013   Secondary to HTN +lithium toxicity over 30 yrs caused kidney destruction [facility] transplant MDs)    Past Surgical History:  Procedure Laterality Date  . AV FISTULA PLACEMENT  04/08/2012   Procedure: ARTERIOVENOUS (AV) FISTULA CREATION;  Surgeon: Angelia Mould, MD;  Location: [facility] OR;  Service: Vascular;  Laterality: Left;  Creation of left brachial cephalic arteriovenous fistula  . COLONOSCOPY   2013   Normal except diverticulosis (recall 10 yrs)  . dilation for GERD    . KIDNEY TRANSPLANT  11/06/12   [place] (cadaveric)  . PROSTATE BIOPSY  2011   no malignancy  . TRANSTHORACIC ECHOCARDIOGRAM  11/2016   Normal LV systolic fxn, EF 19-50%.  Grade I DD.  Mild aortic root dilatation, mild MV regurg.    There were no vitals filed for this visit.      Subjective Assessment - 02/03/17 1451    Subjective Pt reports going to lunch today and "forgetting" his cane; reports he was able to cross the entire restaurant, get food on tray and back to table carrying items without difficulty.  Did have to cease walking to talk to friend.  Reports improved steadiness on feet in the am and can don shirt without sitting down.  Still having significant fatigue after community outings.    Patient is accompained by: Family member   Patient Stated Goals Focus on turning.  Pt would still like to have more confidence with dual task in standing-standing at check out counter and managing debit card/wallet, take communion in standing without fear of falling   Currently in Pain? No/denies           [facility] Adult PT Treatment/Exercise - 02/03/17 1454      Knee/Hip Exercises: Aerobic   Tread Mill x 8 minutes with random intervals of slope  elevation, changes in treadmill speed from 1.0<>0.4, adding in vetical and horizontal head turns with bilat UE support and verbal cues for increased stance time, step length bilaterally, foot clearance and heel strike  80 bpm afterwards         Vestibular Treatment/Exercise - 02/03/17 1536      Vestibular Treatment/Exercise   Gaze Exercises X1 Viewing Horizontal;X1 Viewing Vertical     X1 Viewing Horizontal   Foot Position apart, solid surface   Reps 1   Comments 60 seconds, busy background     X1 Viewing Vertical   Foot Position apart, solid surface   Reps 1   Comments 60 seconds, busy background            Balance Exercises - 02/03/17 1509       Balance Exercises: Standing   Cone Rotation Right turn;Left turn;Cognitive challenge   Cone Rotation Limitations performed figure 8 gait around cones with varying gait speeds, sudden stops and reversing directions; cognitive challenge of matching color to activity.           PT Education - 02/03/17 1537    Education provided Yes   Education Details updated and progressed all HEP; added prone hip extension-demonstrated to pt only.     Person(s) Educated Patient   Methods Explanation;Demonstration;Handout   Comprehension Verbalized understanding          PT Short Term Goals - 01/28/17 1108      PT SHORT TERM GOAL #1   Title The patient will return demo HEP independently with written instruction.   Baseline Target date 01/30/2017   Status Achieved     PT SHORT TERM GOAL #2   Title The patient will improve Berg from 40/56 up to 45/56 to demo dec'ing risk for falls.   Baseline 48/56 on 01/28/17   Status Achieved     PT SHORT TERM GOAL #3   Title The patient will ambulate household surfaces without a device independently x 300 ft in clinic (and per subjective report for home).   Baseline met on 01/28/17   Status Achieved     PT SHORT TERM GOAL #4   Title The patient will improve gait speed without device from 2.7 ft/sec to > or equal to 3.1 ft/sec to demo increasing mobility for community activities.   Baseline Patient ambulates 3.1 ft/sec on 01/28/17   Status Achieved     PT SHORT TERM GOAL #5   Title The patient will ambulate in busy environment carrying a plate (with SPC if needed) in order to return to weekly lunch outing with friends.   Baseline Met per subjective reports on 01/28/17   Status Achieved     PT SHORT TERM GOAL #6   Title The patient will be further assessed on FGA to establish baseline for LTG.   Baseline Target date 01/30/2017   Status Achieved     PT SHORT TERM GOAL #7   Title Pt will improve balance and decrease falls risk during dynamic gait as indicated by  FGA score of >24/30   Baseline Improved from 15/30 up to 19/30 on 01/28/17   Status Partially Met           PT Long Term Goals - 01/04/17 1247      PT LONG TERM GOAL #1   Title The patient will return to [facility] routine with guidance from PT on appropriate activities.   Baseline Target date 02/28/17   Time 8   Period Weeks  Status On-going     PT LONG TERM GOAL #2   Title The patient will improve Berg score from 40/56 to > or equal to 49/56 to demonstrate improving high level balance.   Baseline Target date 02/28/17   Status On-going     PT LONG TERM GOAL #3   Title The patient will improve TUG from 14.34 seconds to < or equal to 13 seconds without a device to demo dec'ing risk for falls.   Baseline Target date 02/28/17   Status On-going     PT LONG TERM GOAL #4   Title The patient will negotiate 4 steps (x 3 reps) without handrails with reciprocal pattern independently.    Baseline Target date 02/28/17   Status On-going     PT LONG TERM GOAL #5   Title The patient will return to community ambulation without a device independently including level/unlevel surfaces (grass, curbs, inclines).   Baseline Target date 02/28/17   Status On-going     PT LONG TERM GOAL #6   Title The patient will improve FGA from 10 points over established baseline (see STGs)   Baseline Target date 02/28/17   Time 8   Period Weeks   Status Revised     PT LONG TERM GOAL #7   Title Improve Neuro QOL: LE by > or equal to 15% (baseline is 44.7%).   Baseline Target date 02/28/17   Status On-going               Plan - 02/03/17 1538    Clinical Impression Statement Pt continues to make good progress now on 1x/week therapy; plans to go to [place] class on Monday.  Treatment session focused on more dynamic gait training with changes in gait speed, inclines, head turns, sudden stops and changes in direction.  Also focused on progressing and upgrading HEP due to fast progress.  At end of session pt reported  slight increase in symptoms but tolerated well.  Will continue to address.   Rehab Potential Good   PT Frequency 1x / week   PT Duration 4 weeks   PT Treatment/Interventions ADLs/Self Care Home Management;Therapeutic activities;Therapeutic exercise;Balance training;Neuromuscular re-education;Vestibular;Patient/family education;Functional mobility training;Gait training;Stair training;DME Instruction   PT Next Visit Plan G CODE DUE IN 1 VISIT Compliant surfaces with eyes closed, speed during turns with visual cues, starts/stops and changes in speed, walking program, dual tasking during gait   Consulted and Agree with Plan of Care Patient      Patient will benefit from skilled therapeutic intervention in order to improve the following deficits and impairments:  Abnormal gait, Decreased balance, Difficulty walking, Dizziness, Postural dysfunction, Decreased mobility, Impaired flexibility  Visit Diagnosis: Other abnormalities of gait and mobility  Dizziness and giddiness  Unsteadiness on feet     Problem List Patient Active Problem List   Diagnosis Date Noted  . Stroke ([place]) 11/30/2016  . Essential hypertension 01/16/2015  . S/p cadaver renal transplant 11/06/2012  . Chronic kidney disease (CKD), stage IV (severe) ([place]) 03/18/2012  . BENIGN PROSTATIC HYPERTROPHY 04/18/2008  . EXTRINSIC ASTHMA, UNSPECIFIED 12/19/2007  . PSA, INCREASED 12/09/2007  . Hypothyroidism 08/12/2007  . Bipolar disorder ([place]) 07/27/2007  . Hyperlipidemia 07/07/2007  . Gout 07/07/2007  . HYPERTENSION 07/07/2007  . GERD 07/07/2007    Raylene Everts, PT, DPT 02/03/17    3:43 PM    [facility] [address] [place], [place], [address] Phone: [phone]   Fax:  [phone]  Name:  Curtis Jimenez MRN: [id number] Date of Birth: Aug 19, 1936

## 2017-02-08 ENCOUNTER — Encounter: Payer: Medicare HMO | Admitting: Physical Therapy

## 2017-02-08 NOTE — Progress Notes (Signed)
OK, dx changed:  I69.398 (other sequelae of cerebral infarction) along with dx for the residual effect :   H81.93 (unspecified disorder of vestibular function)

## 2017-02-09 ENCOUNTER — Ambulatory Visit (INDEPENDENT_AMBULATORY_CARE_PROVIDER_SITE_OTHER): Payer: Medicare HMO

## 2017-02-09 ENCOUNTER — Ambulatory Visit: Payer: Medicare HMO | Admitting: Physician Assistant

## 2017-02-09 ENCOUNTER — Ambulatory Visit (INDEPENDENT_AMBULATORY_CARE_PROVIDER_SITE_OTHER): Payer: Medicare HMO | Admitting: Physician Assistant

## 2017-02-09 ENCOUNTER — Encounter: Payer: Self-pay | Admitting: Physician Assistant

## 2017-02-09 VITALS — BP 120/66 | HR 78 | Temp 97.6°F | Ht 69.5 in | Wt 206.0 lb

## 2017-02-09 DIAGNOSIS — M545 Low back pain, unspecified: Secondary | ICD-10-CM

## 2017-02-09 DIAGNOSIS — M47816 Spondylosis without myelopathy or radiculopathy, lumbar region: Secondary | ICD-10-CM | POA: Diagnosis not present

## 2017-02-09 DIAGNOSIS — N184 Chronic kidney disease, stage 4 (severe): Secondary | ICD-10-CM

## 2017-02-09 DIAGNOSIS — Z940 Kidney transplant status: Secondary | ICD-10-CM

## 2017-02-09 LAB — POC URINALSYSI DIPSTICK (AUTOMATED)
BILIRUBIN UA: NEGATIVE
Glucose, UA: NEGATIVE
KETONES UA: NEGATIVE
LEUKOCYTES UA: NEGATIVE
Nitrite, UA: NEGATIVE
PH UA: 6
PROTEIN UA: NEGATIVE
RBC UA: NEGATIVE
SPEC GRAV UA: 1.015
Urobilinogen, UA: 0.2

## 2017-02-09 MED ORDER — BACLOFEN 10 MG PO TABS: ORAL_TABLET | ORAL | 0 refills | Status: DC

## 2017-02-09 MED ORDER — METHYLPREDNISOLONE 4 MG PO TBPK: ORAL_TABLET | ORAL | 0 refills | Status: DC

## 2017-02-09 MED ORDER — BACLOFEN 10 MG PO TABS: 5.0000 mg | ORAL_TABLET | Freq: Three times a day (TID) | ORAL | 0 refills | Status: DC | PRN

## 2017-02-09 NOTE — Progress Notes (Signed)
Pre visit review using our clinic review tool, if applicable. No additional management support is needed unless otherwise documented below in the visit note. 

## 2017-02-09 NOTE — Progress Notes (Signed)
Subjective:    Patient ID: Curtis Jimenez, male    DOB: [birth year], 81 y.o.   MRN: [id number]  HPI  Mr. Limehouse is a 81 y/o male who presents with chief complaint of "low back pain."  Patient presents for evaluation of low back problems. Symptoms have been present for 4 days and include stiffness in bilateral low back areas. Initial inciting event: walking on a treadmill during PT. Symptoms are worse at no particular time, occurs throughout the day. Alleviating factors identifiable by the patient are recumbency. Aggravating factors identifiable by the patient are bending sideways and standing. Treatments initiated by the patient: Tylenol and rest. Previous lower back problems: low back pain decades ago, had a CT scan per his report that only showed "muscular problems."  Previous treatments: when he was much younger he used a muscle relaxer successfully.  He denies: urinary symptoms, numbness, tingling, bowel/bladder incontinence, fever, chills  He has had mild weight loss from 210 to 206 lb per his report x 1 week, he states that it is intentional.  Of note, he has a history of kidney transplant and currently has CKD stage III.  Review of Systems  See HPI  Past Medical History:  Diagnosis Date  . Anemia   . Arthritis   . Basal cell carcinoma    neck (skin MD 2X per year)  . Bipolar disorder ([place])   . BPH (benign prostatic hyperplasia)    with elevated PSA; followed by Dr. Rosana [facility]es at [facility]  . Cataract   . Chronic renal insufficiency, stage III (moderate)    Last f/u with neph 07/06/16: Cr stable at 1.57 (GFR 42)  . CVA (cerebral vascular accident) ([place]) 11/2016   Pontine (imaging neg), TPA given.  Pt discharged on plavix.  Carotid dopplers ok, echocardiogram ok.  Residual deficit: vertigo, nystagmus, R facial droop.  . Gallbladder polyp 2015   Asymptomatic  . GERD (gastroesophageal reflux disease)   . Gout   . Hyperlipidemia   . Hypertension   . Hypothyroidism     . Restless legs syndrome   . S/p cadaver renal transplant 2013   Secondary to HTN +lithium toxicity over 30 yrs caused kidney destruction [facility] transplant MDs)     Social History   Social History  . Marital status: Married    Spouse name: N/A  . Number of children: N/A  . Years of education: N/A   Occupational History  . Not on file.   Social History Main Topics  . Smoking status: Former Smoker    Years: 16.00    Types: Pipe, Cigars    Quit date: 05/11/1976  . Smokeless tobacco: Never Used  . Alcohol use No  . Drug use: No  . Sexual activity: Not on file   Other Topics Concern  . Not on file   Social History Narrative   Married, 4 children, 9 GGC, [place].   Occupation: retired Marine scientist.  Originally from [place] [employer] [place].   Tobacco: quit 1975; smoked pipes and cigars x 15 yrs prior to this.   Alcohol: none.   Exercise: minimal, but is going to sign up for silver sneakers at the [facility].    Past Surgical History:  Procedure Laterality Date  . AV FISTULA PLACEMENT  04/08/2012   Procedure: ARTERIOVENOUS (AV) FISTULA CREATION;  Surgeon: Angelia Mould, MD;  Location: [facility] OR;  Service: Vascular;  Laterality: Left;  Creation of left brachial cephalic arteriovenous fistula  . COLONOSCOPY  2013   Normal except diverticulosis (  recall 10 yrs)  . dilation for GERD    . KIDNEY TRANSPLANT  11/06/12   [place] (cadaveric)  . PROSTATE BIOPSY  2011   no malignancy  . TRANSTHORACIC ECHOCARDIOGRAM  11/2016   Normal LV systolic fxn, EF 34-19%.  Grade I DD.  Mild aortic root dilatation, mild MV regurg.    Family History  Problem Relation Age of Onset  . Cancer Father     BLADDER  . Heart disease Father     Allergies  Allergen Reactions  . Amantadine Hcl     REACTION: itching  . Chlorpromazine Hcl Other (See Comments)    REACTION: "fell flat on face"    Current Outpatient Prescriptions on File Prior to Visit  Medication Sig Dispense Refill  . acetaminophen (TYLENOL) 325 MG  tablet Take 650 mg by mouth every 6 (six) hours as needed for mild pain.    Marland Kitchen allopurinol (ZYLOPRIM) 300 MG tablet Take 1 tablet (300 mg total) by mouth daily. 90 tablet 3  . amLODipine (NORVASC) 5 MG tablet Take 5 mg by mouth daily.     Marland Kitchen amoxicillin (AMOXIL) 500 MG capsule     . carvedilol (COREG) 6.25 MG tablet Take 1 tablet (6.25 mg total) by mouth 2 (two) times daily with a meal. (Patient taking differently: Taking 2 tablets in the morning and 1 tablet in the evening) 180 tablet 3  . Cholecalciferol (VITAMIN D) 2000 UNITS CAPS Take 1 capsule by mouth daily.    . clopidogrel (PLAVIX) 75 MG tablet Take 1 tablet (75 mg total) by mouth daily. 90 tablet 3  . colchicine 0.6 MG tablet 2 tabs at onset of gout flare, then 1 tab one hour later, then starting the next day take 1 tab bid until gout flare is resolved 20 tablet 1  . hydrOXYzine (ATARAX/VISTARIL) 25 MG tablet Take 10 mg by mouth 2 (two) times daily.     Marland Kitchen levothyroxine (SYNTHROID, LEVOTHROID) 25 MCG tablet TAKE ONE TABLET BY MOUTH ONCE DAILY 90 tablet 3  . mycophenolate (MYFORTIC) 180 MG EC tablet Take 360 mg by mouth 2 (two) times daily.     . Omega-3 1000 MG CAPS Take 2,000 mg by mouth 2 (two) times daily.     Marland Kitchen omeprazole (PRILOSEC) 20 MG capsule Take 1 capsule (20 mg total) by mouth daily. 90 capsule 3  . simvastatin (ZOCOR) 20 MG tablet Take 10 mg by mouth daily.    . simvastatin (ZOCOR) 40 MG tablet TAKE ONE-HALF TABLET BY MOUTH ONCE DAILY 45 tablet 3  . sulfamethoxazole-trimethoprim (BACTRIM,SEPTRA) 400-80 MG tablet Take 1 tablet by mouth every Monday, Wednesday, and Friday.    . tacrolimus (PROGRAF) 0.5 MG capsule Take 0.5 mg by mouth at bedtime. Also taking 1mg  in the morning    . tacrolimus (PROGRAF) 1 MG capsule Take 1 mg by mouth every morning. Also taking 0.5mg  at bedtime    . vitamin B-12 (CYANOCOBALAMIN) 100 MCG tablet Take 100 mcg by mouth daily.     No current facility-administered medications on file prior to visit.      BP 120/66 (BP Location: Right Arm, Patient Position: Sitting, Cuff Size: Large)   Pulse 78   Temp 97.6 F (36.4 C) (Oral)   Ht 5' 9.5" (1.765 m)   Wt 206 lb (93.4 kg)   SpO2 95%   BMI 29.98 kg/m        Objective:   Physical Exam  Constitutional: He appears well-developed and well-nourished. He is cooperative.  Non-toxic appearance. He does not have a sickly appearance. He does not appear ill. No distress.  Cardiovascular: Normal rate, regular rhythm and normal heart sounds.   Pulmonary/Chest: Effort normal and breath sounds normal. No accessory muscle usage. No respiratory distress.  Abdominal: There is no CVA tenderness.  Musculoskeletal:       Lumbar back: He exhibits decreased range of motion and pain. He exhibits no tenderness, no bony tenderness, no swelling, no edema and no deformity.       Back:  No reproducible tenderness; pain with lateral side bends, no pain with flexion/exension or twisting motion; negative straight leg raise  Neurological: He is alert.  Nursing note and vitals reviewed.  Urinalysis: negative for infection/blood  DG Lumbar Spine 2-3 views:  1. Diffuse multilevel degenerative change. No acute bony abnormality.  2. Aortoiliac atherosclerotic vascular disease.  3. 4 mm calcific density noted over the right upper abdomen. This could represent a tiny stone in the right kidney or right upper ureter. This could represent a small calcified lymph node or phlebolith. Tiny punctate calcific density noted over the left kidney could also represent a tiny kidney stone.     Assessment & Plan:  1. Acute bilateral low back pain without sciatica POCT urine dip without evidence of blood or infection. Will treat with medrol dose pack and low dose muscle relaxer, see below. I told patient that if his pain does not improve, to follow up with Dr. Teresa Coombs or his PCP. If any acute changes, increase in severity of pain or other new symptoms, please seek  medical attention at ER. I also recommended further follow-up with his PCP for the lumbar spine imaging findings.  2. Chronic kidney disease (CKD), stage IV (severe) (HCC) and s/p cadaver renal transplant Limited options for treatment of pain. Will use medrol dose pack. Reviewed case with Dr. Teresa Coombs, will provide 2.5mg  dose baclofen (adjusted for CKD) to be used as needed for back pain. Discussed with patient and wife about potential side effects of medication and requested that patient immediately discontinue medicine if it causes any dizziness, issues with balance or other concerning side effects.  Inda Coke PA-C 02/09/17

## 2017-02-09 NOTE — Patient Instructions (Addendum)
It was great meeting you today!  Start your steroid dose pack today. You may take the Baclofen, muscle relaxer, as needed every 8 hours for your back pain. Please do not continue to take if it causes dizziness or other side effects.  We will call you with your xray results after they have been read by a radiologist.  If you do not feel any improvement with our treatment plan, please schedule an appointment with Dr. Teresa Coombs in sports medicine for further evaluation, or you may see your PCP.   Back Pain, Adult Back pain is very common in adults.The cause of back pain is rarely dangerous and the pain often gets better over time.The cause of your back pain may not be known. Some common causes of back pain include:  Strain of the muscles or ligaments supporting the spine.  Wear and tear (degeneration) of the spinal disks.  Arthritis.  Direct injury to the back. For many people, back pain may return. Since back pain is rarely dangerous, most people can learn to manage this condition on their own. Follow these instructions at home: Watch your back pain for any changes. The following actions may help to lessen any discomfort you are feeling:  Remain active. It is stressful on your back to sit or stand in one place for long periods of time. Do not sit, drive, or stand in one place for more than 30 minutes at a time. Take short walks on even surfaces as soon as you are able.Try to increase the length of time you walk each day.  Exercise regularly as directed by your health care provider. Exercise helps your back heal faster. It also helps avoid future injury by keeping your muscles strong and flexible.  Do not stay in bed.Resting more than 1-2 days can delay your recovery.  Pay attention to your body when you bend and lift. The most comfortable positions are those that put less stress on your recovering back. Always use proper lifting techniques, including:  Bending your  knees.  Keeping the load close to your body.  Avoiding twisting.  Find a comfortable position to sleep. Use a firm mattress and lie on your side with your knees slightly bent. If you lie on your back, put a pillow under your knees.  Avoid feeling anxious or stressed.Stress increases muscle tension and can worsen back pain.It is important to recognize when you are anxious or stressed and learn ways to manage it, such as with exercise.  Take medicines only as directed by your health care provider. Over-the-counter medicines to reduce pain and inflammation are often the most helpful.Your health care provider may prescribe muscle relaxant drugs.These medicines help dull your pain so you can more quickly return to your normal activities and healthy exercise.  Apply ice to the injured area:  Put ice in a plastic bag.  Place a towel between your skin and the bag.  Leave the ice on for 20 minutes, 2-3 times a day for the first 2-3 days. After that, ice and heat may be alternated to reduce pain and spasms.  Maintain a healthy weight. Excess weight puts extra stress on your back and makes it difficult to maintain good posture. Contact a health care provider if:  You have pain that is not relieved with rest or medicine.  You have increasing pain going down into the legs or buttocks.  You have pain that does not improve in one week.  You have night pain.  You lose  weight.  You have a fever or chills. Get help right away if:  You develop new bowel or bladder control problems.  You have unusual weakness or numbness in your arms or legs.  You develop nausea or vomiting.  You develop abdominal pain.  You feel faint. This information is not intended to replace advice given to you by your health care provider. Make sure you discuss any questions you have with your health care provider. Document Released: 12/14/2005 Document Revised: 04/23/2016 Document Reviewed: 04/17/2014 Elsevier  Interactive Patient Education  2017 [employer].

## 2017-02-12 ENCOUNTER — Ambulatory Visit: Payer: Medicare HMO | Admitting: Physical Therapy

## 2017-02-12 ENCOUNTER — Encounter: Payer: Self-pay | Admitting: Physical Therapy

## 2017-02-12 DIAGNOSIS — R2689 Other abnormalities of gait and mobility: Secondary | ICD-10-CM

## 2017-02-12 DIAGNOSIS — R2681 Unsteadiness on feet: Secondary | ICD-10-CM

## 2017-02-12 DIAGNOSIS — R42 Dizziness and giddiness: Secondary | ICD-10-CM

## 2017-02-12 NOTE — Therapy (Signed)
[facility] [address] [place], [place], [address] Phone: [phone]   Fax:  [phone]  Physical Therapy Treatment  Patient Details  Name: Curtis Jimenez MRN: [id number] Date of Birth: 1936-12-17 Referring Provider: Ricardo Jericho, MD  Encounter Date: 02/12/2017      PT End of Session - 02/12/17 1105    Visit Number 10   Number of Visits 16   Date for PT Re-Evaluation 02/28/17   Authorization Type G code every 10th visit ($40 copay)   PT Start Time 1020   PT Stop Time 1102   PT Time Calculation (min) 42 min   Activity Tolerance Patient tolerated treatment well   Behavior During Therapy [facility] for tasks assessed/performed      Past Medical History:  Diagnosis Date  . Anemia   . Arthritis   . Basal cell carcinoma    neck (skin MD 2X per year)  . Bipolar disorder ([place])   . BPH (benign prostatic hyperplasia)    with elevated PSA; followed by Dr. Rosana Hoes at [facility]  . Cataract   . Chronic renal insufficiency, stage III (moderate)    Last f/u with neph 07/06/16: Cr stable at 1.57 (GFR 42)  . CVA (cerebral vascular accident) ([place]) 11/2016   Pontine (imaging neg), TPA given.  Pt discharged on plavix.  Carotid dopplers ok, echocardiogram ok.  Residual deficit: vertigo, nystagmus, R facial droop.  . Gallbladder polyp 2015   Asymptomatic  . GERD (gastroesophageal reflux disease)   . Gout   . Hyperlipidemia   . Hypertension   . Hypothyroidism   . Restless legs syndrome   . S/p cadaver renal transplant 2013   Secondary to HTN +lithium toxicity over 30 yrs caused kidney destruction [facility] transplant MDs)    Past Surgical History:  Procedure Laterality Date  . AV FISTULA PLACEMENT  04/08/2012   Procedure: ARTERIOVENOUS (AV) FISTULA CREATION;  Surgeon: Angelia Mould, MD;  Location: [facility] OR;  Service: Vascular;  Laterality: Left;  Creation of left brachial cephalic arteriovenous fistula  .  COLONOSCOPY  2013   Normal except diverticulosis (recall 10 yrs)  . dilation for GERD    . KIDNEY TRANSPLANT  11/06/12   [place] (cadaveric)  . PROSTATE BIOPSY  2011   no malignancy  . TRANSTHORACIC ECHOCARDIOGRAM  11/2016   Normal LV systolic fxn, EF 73-73%.  Grade I DD.  Mild aortic root dilatation, mild MV regurg.    There were no vitals filed for this visit.      Subjective Assessment - 02/12/17 1027    Subjective Pt reports decrease in activity this past week due to back pain starting after last session (specifically treadmill) that became progressively worse with each day.  Pt visited MD who did imaging-determined it was muscular in nature and provided pt with mm relaxer and steroid.  Pt reports improvement in mm spasms but reports feeling sore in low back.  Pt unable to perform exercises this past week.   Patient is accompained by: Family member   Patient Stated Goals Focus on turning.  Pt would still like to have more confidence with dual task in standing-standing at check out counter and managing debit card/wallet, take communion in standing without fear of falling   Currently in Pain? Yes   Pain Score 1    Pain Location Back   Pain Orientation Lower   Pain Descriptors / Indicators Sore   Pain Type Acute pain  [facility] Adult PT Treatment/Exercise - 02/12/17 1034      Ambulation/Gait   Stairs Yes   Stairs Assistance 6: Modified independent (Device/Increase time)   Stair Management Technique Alternating pattern;No rails   Number of Stairs 12   Height of Stairs 6     Standardized Balance Assessment   Standardized Balance Assessment Timed Up and Go Test     Timed Up and Go Test   TUG Normal TUG   Normal TUG (seconds) 11.25  looking at floor, 13 sec looking up, no AD     Lumbar Exercises: Stretches   Active Hamstring Stretch 1 rep;30 seconds  R and L   Active Hamstring Stretch Limitations R and L in supine   Single Knee to Chest Stretch 1 rep;60 seconds    Single Knee to Chest Stretch Limitations R and L    Double Knee to Chest Stretch 1 rep;60 seconds   Lower Trunk Rotation 5 reps   Lower Trunk Rotation Limitations to L and R   Hip Flexor Stretch 1 rep;60 seconds   Hip Flexor Stretch Limitations in runner's lunge, R and LLE   Standing Side Bend 1 rep;60 seconds  seated side bend on mat, R and L side   Standing Side Bend Limitations Also performed standing forward flexion with back straight, UE on counter           PT Education - 02/12/17 1104    Education provided Yes   Education Details stretches for low back soreness to allow pt to return to performing HEP and community mobility; progress towards LTG   Person(s) Educated Patient   Methods Explanation   Comprehension Verbalized understanding          PT Short Term Goals - 01/28/17 1108      PT SHORT TERM GOAL #1   Title The patient will return demo HEP independently with written instruction.   Baseline Target date 01/30/2017   Status Achieved     PT SHORT TERM GOAL #2   Title The patient will improve Berg from 40/56 up to 45/56 to demo dec'ing risk for falls.   Baseline 48/56 on 01/28/17   Status Achieved     PT SHORT TERM GOAL #3   Title The patient will ambulate household surfaces without a device independently x 300 ft in clinic (and per subjective report for home).   Baseline met on 01/28/17   Status Achieved     PT SHORT TERM GOAL #4   Title The patient will improve gait speed without device from 2.7 ft/sec to > or equal to 3.1 ft/sec to demo increasing mobility for community activities.   Baseline Patient ambulates 3.1 ft/sec on 01/28/17   Status Achieved     PT SHORT TERM GOAL #5   Title The patient will ambulate in busy environment carrying a plate (with SPC if needed) in order to return to weekly lunch outing with friends.   Baseline Met per subjective reports on 01/28/17   Status Achieved     PT SHORT TERM GOAL #6   Title The patient will be further assessed  on FGA to establish baseline for LTG.   Baseline Target date 01/30/2017   Status Achieved     PT SHORT TERM GOAL #7   Title Pt will improve balance and decrease falls risk during dynamic gait as indicated by FGA score of >24/30   Baseline Improved from 15/30 up to 19/30 on 01/28/17   Status Partially Met  PT Long Term Goals - 2017-03-11 1055      PT LONG TERM GOAL #1   Title The patient will return to [facility] routine with guidance from PT on appropriate activities and will participate in community TAI CHI class in March (REVISED 2017/03/11)   Baseline Target date 02/28/17   Time 8   Period Weeks   Status Revised     PT LONG TERM GOAL #2   Title The patient will improve Berg score from 40/56 to > or equal to 49/56 to demonstrate improving high level balance.   Baseline Target date 02/28/17   Status On-going     PT LONG TERM GOAL #3   Title The patient will improve TUG from 14.34 seconds to < or equal to 13 seconds without a device to demo dec'ing risk for falls.   Baseline MET 03/11/2017-11 seconds without AD   Status Achieved     PT LONG TERM GOAL #4   Title The patient will negotiate 4 steps (x 3 reps) without handrails with reciprocal pattern independently.    Baseline MET 2017/03/11   Status Achieved     PT LONG TERM GOAL #5   Title The patient will return to community ambulation without a device independently including level/unlevel surfaces (grass, curbs, inclines).   Baseline Target date 02/28/17   Status On-going     PT LONG TERM GOAL #6   Title The patient will improve FGA from 10 points over established baseline (see STGs)   Baseline Target date 02/28/17   Time 8   Period Weeks   Status On-going     PT LONG TERM GOAL #7   Title Improve Neuro QOL: LE by > or equal to 15% (baseline is 44.7%).   Baseline Target date 02/28/17   Status On-going               Plan - 03-11-17 1106    Clinical Impression Statement Pt presents today with LBP that has lasted since  last session; due to LBP pt reports decrease in physical activity, unable to attend Tai Chi class and unable to perform HEP this week.  Provided pt with low back and LE stretches for pain management to allow pt to return to exercises and activities.  Performed re-assessment of TUG and stair negotiation; both LTG met.  Discussed progress with pt and plan for next few visits.  Pt reported improvement in low back pain/soreness after exercises.   Rehab Potential Good   PT Frequency 1x / week   PT Duration 4 weeks   PT Treatment/Interventions ADLs/Self Care Home Management;Therapeutic activities;Therapeutic exercise;Balance training;Neuromuscular re-education;Vestibular;Patient/family education;Functional mobility training;Gait training;Stair training;DME Instruction   PT Next Visit Plan Compliant surfaces with eyes closed, speed during turns with visual cues, starts/stops and changes in speed, walking program, dual tasking during gait   Consulted and Agree with Plan of Care Patient      Patient will benefit from skilled therapeutic intervention in order to improve the following deficits and impairments:  Abnormal gait, Decreased balance, Difficulty walking, Dizziness, Postural dysfunction, Decreased mobility, Impaired flexibility  Visit Diagnosis: Other abnormalities of gait and mobility  Dizziness and giddiness  Unsteadiness on feet       G-Codes - 03-11-2017 1109    Functional Assessment Tool Used BERG = 48/56, TUG = 11 seconds, FGA = 19/30   Functional Limitation Mobility: Walking and moving around   Mobility: Walking and Moving Around Current Status (B1517) At least 1 percent but less than 20 percent  impaired, limited or restricted   Mobility: Walking and Moving Around Goal Status [phone]) At least 1 percent but less than 20 percent impaired, limited or restricted      Problem List Patient Active Problem List   Diagnosis Date Noted  . Stroke ([place]) 11/30/2016  . Essential hypertension  01/16/2015  . S/p cadaver renal transplant 11/06/2012  . Chronic kidney disease (CKD), stage IV (severe) ([place]) 03/18/2012  . BENIGN PROSTATIC HYPERTROPHY 04/18/2008  . EXTRINSIC ASTHMA, UNSPECIFIED 12/19/2007  . PSA, INCREASED 12/09/2007  . Hypothyroidism 08/12/2007  . Bipolar disorder ([place]) 07/27/2007  . Hyperlipidemia 07/07/2007  . Gout 07/07/2007  . HYPERTENSION 07/07/2007  . GERD 07/07/2007    Physical Therapy Progress Note  Dates of Reporting Period: 12/30/2016 to 02/12/2017  Objective Reports of Subjective Statement: see above  Objective Measurements: see above  Goal Update: see LTG above  Plan: Continue PT 1x/week until 03/30/3535 when re-certification due.    Reason Skilled Services are Required: Pt continues to require skilled PT services to address ongoing dizziness, imbalance, weakness, difficulty walking in order to maximize functional mobility independence and reduce falls risk.  Raylene Everts, PT, DPT 02/12/17    11:17 AM   [facility] [address] [place], [place], [address] Phone: [phone]   Fax:  [phone]  Name: Curtis Jimenez MRN: [id number] Date of Birth: [birth year]

## 2017-02-15 ENCOUNTER — Encounter: Payer: Medicare HMO | Admitting: Rehabilitative and Restorative Service Providers"

## 2017-02-17 ENCOUNTER — Encounter: Payer: Self-pay | Admitting: Neurology

## 2017-02-17 ENCOUNTER — Ambulatory Visit (INDEPENDENT_AMBULATORY_CARE_PROVIDER_SITE_OTHER): Payer: Medicare HMO | Admitting: Neurology

## 2017-02-17 VITALS — BP 110/62 | HR 84 | Ht 69.5 in | Wt 205.0 lb

## 2017-02-17 DIAGNOSIS — I639 Cerebral infarction, unspecified: Secondary | ICD-10-CM | POA: Diagnosis not present

## 2017-02-17 DIAGNOSIS — I10 Essential (primary) hypertension: Secondary | ICD-10-CM

## 2017-02-17 DIAGNOSIS — I69393 Ataxia following cerebral infarction: Secondary | ICD-10-CM

## 2017-02-17 DIAGNOSIS — E785 Hyperlipidemia, unspecified: Secondary | ICD-10-CM | POA: Diagnosis not present

## 2017-02-17 NOTE — Patient Instructions (Signed)
1.  Continue Plavix 75mg  daily for secondary stroke prevention 2.  Continue simvastatin for cholesterol 3.  Optimize blood pressure control (goal less than 185 systolic and less than 90 diastolic) 4.  Continue physical therapy for balance, walking and dizziness 5.  Mediterranean diet  Mediterranean Diet A Mediterranean diet refers to food and lifestyle choices that are based on the traditions of countries located on the [employer]. This way of eating has been shown to help prevent certain conditions and improve outcomes for people who have chronic diseases, like kidney disease and heart disease. What are tips for following this plan? Lifestyle  Cook and eat meals together with your family, when possible.  Drink enough fluid to keep your urine clear or pale yellow.  Be physically active every day. This includes:  Aerobic exercise like running or swimming.  Leisure activities like gardening, walking, or housework.  Get 7-8 hours of sleep each night.  If recommended by your health care provider, drink red wine in moderation. This means 1 glass a day for nonpregnant women and 2 glasses a day for men. A glass of wine equals 5 oz (150 mL). Reading food labels  Check the serving size of packaged foods. For foods such as rice and pasta, the serving size refers to the amount of cooked product, not dry.  Check the total fat in packaged foods. Avoid foods that have saturated fat or trans fats.  Check the ingredients list for added sugars, such as corn syrup. Shopping  At the grocery store, buy most of your food from the areas near the walls of the store. This includes:  Fresh fruits and vegetables (produce).  Grains, beans, nuts, and seeds. Some of these may be available in unpackaged forms or large amounts (in bulk).  Fresh seafood.  Poultry and eggs.  Low-fat dairy products.  Buy whole ingredients instead of prepackaged foods.  Buy fresh fruits and vegetables in-season  from local farmers markets.  Buy frozen fruits and vegetables in resealable bags.  If you do not have access to quality fresh seafood, buy precooked frozen shrimp or canned fish, such as tuna, salmon, or sardines.  Buy small amounts of raw or cooked vegetables, salads, or olives from the deli or salad bar at your store.  Stock your pantry so you always have certain foods on hand, such as olive oil, canned tuna, canned tomatoes, rice, pasta, and beans. Cooking  Cook foods with extra-virgin olive oil instead of using butter or other vegetable oils.  Have meat as a side dish, and have vegetables or grains as your main dish. This means having meat in small portions or adding small amounts of meat to foods like pasta or stew.  Use beans or vegetables instead of meat in common dishes like chili or lasagna.  Experiment with different cooking methods. Try roasting or broiling vegetables instead of steaming or sauteing them.  Add frozen vegetables to soups, stews, pasta, or rice.  Add nuts or seeds for added healthy fat at each meal. You can add these to yogurt, salads, or vegetable dishes.  Marinate fish or vegetables using olive oil, lemon juice, garlic, and fresh herbs. Meal planning  Plan to eat 1 vegetarian meal one day each week. Try to work up to 2 vegetarian meals, if possible.  Eat seafood 2 or more times a week.  Have healthy snacks readily available, such as:  Vegetable sticks with hummus.  Greek yogurt.  Fruit and nut trail mix.  Eat balanced  meals throughout the week. This includes:  Fruit: 2-3 servings a day  Vegetables: 4-5 servings a day  Low-fat dairy: 2 servings a day  Fish, poultry, or lean meat: 1 serving a day  Beans and legumes: 2 or more servings a week  Nuts and seeds: 1-2 servings a day  Whole grains: 6-8 servings a day  Extra-virgin olive oil: 3-4 servings a day  Limit red meat and sweets to only a few servings a month What are my food  choices?  Mediterranean diet  Recommended  Grains: Whole-grain pasta. Brown rice. Bulgar wheat. Polenta. Couscous. Whole-wheat bread. [place].  Vegetables: Artichokes. Beets. Broccoli. Cabbage. Carrots. Eggplant. Green beans. Chard. Kale. Spinach. Onions. Leeks. Peas. Squash. Tomatoes. Peppers. Radishes.  Fruits: Apples. Apricots. Avocado. Berries. Bananas. Cherries. Dates. Figs. Grapes. Lemons. Melon. Oranges. Peaches. Plums. Pomegranate.  Meats and other protein foods: Beans. Almonds. Sunflower seeds. Pine nuts. Peanuts. [place]. Salmon. Scallops. Shrimp. [place]. Tilapia. Clams. Oysters. Eggs.  Dairy: Low-fat milk. Cheese. Greek yogurt.  Beverages: Water. Red wine. Herbal tea.  Fats and oils: Extra virgin olive oil. Avocado oil. Grape seed oil.  Sweets and desserts: [place] yogurt with honey. Baked apples. Poached pears. Trail mix.  Seasoning and other foods: Basil. Cilantro. Coriander. Cumin. Mint. Parsley. Sage. Rosemary. Tarragon. Garlic. Oregano. Thyme. Pepper. Balsalmic vinegar. Tahini. Hummus. Tomato sauce. Olives. Mushrooms.  Limit these  Grains: Prepackaged pasta or rice dishes. Prepackaged cereal with added sugar.  Vegetables: Deep fried potatoes (french fries).  Fruits: Fruit canned in syrup.  Meats and other protein foods: Beef. Pork. Lamb. Poultry with skin. Hot dogs. [place] Salines.  Dairy: Ice cream. Sour cream. Whole milk.  Beverages: Juice. Sugar-sweetened soft drinks. Beer. Liquor and spirits.  Fats and oils: Butter. Canola oil. Vegetable oil. Beef fat (tallow). Lard.  Sweets and desserts: Cookies. Cakes. Pies. Candy.  Seasoning and other foods: Mayonnaise. Premade sauces and marinades.  The items listed may not be a complete list. Talk with your dietitian about what dietary choices are right for you. Summary  The Mediterranean diet includes both food and lifestyle choices.  Eat a variety of fresh fruits and vegetables, beans, nuts, seeds, and whole  grains.  Limit the amount of red meat and sweets that you eat.  Talk with your health care provider about whether it is safe for you to drink red wine in moderation. This means 1 glass a day for nonpregnant women and 2 glasses a day for men. A glass of wine equals 5 oz (150 mL). This information is not intended to replace advice given to you by your health care provider. Make sure you discuss any questions you have with your health care provider. Document Released: 08/06/2016 Document Revised: 09/08/2016 Document Reviewed: 08/06/2016 Elsevier Interactive Patient Education  2017 [place]. 6.  Follow up in 5 months.

## 2017-02-17 NOTE — Progress Notes (Signed)
NEUROLOGY CONSULTATION NOTE  MUATH HALLAM MRN: [id number] DOB: [birth year]  Referring provider: Dr. Anitra Lauth Primary care provider: Dr. Anitra Lauth  Reason for consult:  stroke  HISTORY OF PRESENT ILLNESS: Curtis Jimenez is a 81 year old right-handed male with hyperlipidemia, hypertension, hypothyroidism, chronic renal failure stage III status post renal trasnplant, Bipolar disorder and history of basal cell carcinoma on the neck who presents for stroke.  He is accompanied by his wife who supplements history.  History supplemented by hospital notes.  He was admitted to [facility] from 11/30/16 to 12/05/16 after developing sudden onset vertigo, blurred/double vision, nausea and vomiting upon standing up.  On exam, he was found to have nystagmus in the left eye, left arm dysmetria and mild right facial weakness.  He received IV tPA and was admitted to the ICU.  MRI of the brain was personally reviewed and did not reveal an acute stroke.  MRA of head showed no large or proximal vessel stenosis or occlusion.  Carotid doppler showed no hemodynamically significant stenosis.  2D echo showed EF 55-60% with no cardiac source of embolus.  LDL was 65.  Hgb A1c was 5.9.  DWI negative stroke involving the brainstem was still suspected.  Prior to admission, he was on ASA 10m daily.  This was switched to Plavix 758mdaily.  He was advised to continue simvastatin 1011maily.  Since discharge, he still reports dizziness and difficulty with balance and gait.  He has been participating in PT/OT.  He has made significant improvement.  At first, he could not walk.  He then graduated to a walker and now ambulates with a cane outside the house (without a cane inside the house).  He has not had any falls for quite some time.  He still reports vertigo, but it has improved.  He has not been driving.  About 6 weeks prior to the stroke, his wife says he had a dizzy spell while at church.  He was found to have low  blood pressure and his blood pressure medication was being adjusted in the weeks leading up to the stroke.  PAST MEDICAL HISTORY: Past Medical History:  Diagnosis Date  . Anemia   . Arthritis   . Basal cell carcinoma    neck (skin MD 2X per year)  . [place] . BPH (benign prostatic hyperplasia)    with elevated PSA; [facility]  . Cataract   . Chronic renal insufficiency, stage III (moderate)    Last f/u with neph 07/06/16: Cr stable at 1.57 (GFR 42)  . CVA (cerebral vascular accident) ([place]   Pontine (imaging neg), TPA given.  Pt discharged on plavix.  Carotid dopplers ok, echocardiogram ok.  Residual deficit: vertigo, nystagmus, R facial droop.  . Gallbladder polyp 2015   Asymptomatic  . GERD (gastroesophageal reflux disease)   . Gout   . Hyperlipidemia   . Hypertension   . Hypothyroidism   . Restless legs syndrome   . S/p cadaver renal transplant 2013   Secondary to HTN +lithium toxicity over 30 yrs [facility]    PAST SURGICAL HISTORY: Past Surgical History:  Procedure Laterality Date  . AV FISTULA PLACEMENT  04/08/2012   Procedure: ARTERIOVENOUS (AV) FISTULA CREATION;  Surgeon: ChrAngelia MouldD;  [facility];  Service: Vascular;  Laterality: Left;  Creation of left brachial cephalic arteriovenous fistula  . COLONOSCOPY  2013   Normal except  diverticulosis (recall 10 yrs)  . dilation for GERD    . KIDNEY TRANSPLANT  11/06/12   [place] (cadaveric)  . PROSTATE BIOPSY  2011   no malignancy  . TRANSTHORACIC ECHOCARDIOGRAM  11/2016   Normal LV systolic fxn, EF 17-91%.  Grade I DD.  Mild aortic root dilatation, mild MV regurg.    MEDICATIONS: Current Outpatient Prescriptions on File Prior to Visit  Medication Sig Dispense Refill  . acetaminophen (TYLENOL) 325 MG tablet Take 650 mg by mouth every 6 (six) hours as needed for mild pain.    Marland Kitchen allopurinol (ZYLOPRIM) 300 MG  tablet Take 1 tablet (300 mg total) by mouth daily. 90 tablet 3  . baclofen (LIORESAL) 10 MG tablet Take one-quarter of a tablet (0.25 mg) three times daily as needed for back pain 16 each 0  . carvedilol (COREG) 6.25 MG tablet Take 1 tablet (6.25 mg total) by mouth 2 (two) times daily with a meal. (Patient taking differently: Taking 2 tablets in the morning and 1 tablet in the evening) 180 tablet 3  . Cholecalciferol (VITAMIN D) 2000 UNITS CAPS Take 1 capsule by mouth daily.    . clopidogrel (PLAVIX) 75 MG tablet Take 1 tablet (75 mg total) by mouth daily. 90 tablet 3  . hydrOXYzine (ATARAX/VISTARIL) 25 MG tablet Take 10 mg by mouth 2 (two) times daily.     Marland Kitchen levothyroxine (SYNTHROID, LEVOTHROID) 25 MCG tablet TAKE ONE TABLET BY MOUTH ONCE DAILY 90 tablet 3  . mycophenolate (MYFORTIC) 180 MG EC tablet Take 360 mg by mouth 2 (two) times daily.     . Omega-3 1000 MG CAPS Take 2,000 mg by mouth 2 (two) times daily.     Marland Kitchen omeprazole (PRILOSEC) 20 MG capsule Take 1 capsule (20 mg total) by mouth daily. 90 capsule 3  . simvastatin (ZOCOR) 20 MG tablet Take 20 mg by mouth daily.     Marland Kitchen sulfamethoxazole-trimethoprim (BACTRIM,SEPTRA) 400-80 MG tablet Take 1 tablet by mouth every Monday, Wednesday, and Friday.    . tacrolimus (PROGRAF) 0.5 MG capsule Take 0.5 mg by mouth at bedtime. Also taking 12m in the morning    . tacrolimus (PROGRAF) 1 MG capsule Take 1 mg by mouth every morning. Also taking 0.516mat bedtime    . vitamin B-12 (CYANOCOBALAMIN) 100 MCG tablet Take 100 mcg by mouth daily.    . Marland Kitchenmoxicillin (AMOXIL) 500 MG capsule     . colchicine 0.6 MG tablet 2 tabs at onset of gout flare, then 1 tab one hour later, then starting the next day take 1 tab bid until gout flare is resolved (Patient not taking: Reported on 02/17/2017) 20 tablet 1   No current facility-administered medications on file prior to visit.     ALLERGIES: Allergies  Allergen Reactions  . Amantadine Hcl     REACTION: itching  .  Chlorpromazine Hcl Other (See Comments)    REACTION: "fell flat on face"    FAMILY HISTORY: Family History  Problem Relation Age of Onset  . Cancer Father     BLADDER  . Heart disease Father     SOCIAL HISTORY: Social History   Social History  . Marital status: Married    Spouse name: N/A  . Number of children: N/A  . Years of education: N/A   Occupational History  . Not on file.   Social History Main Topics  . Smoking status: Former Smoker    Years: 16.00    Types: Pipe, CiLandscape architect  Quit date: 05/11/1974  . Smokeless tobacco: Never Used  . Alcohol use No  . Drug use: No  . Sexual activity: Not on file   Other Topics Concern  . Not on file   Social History Narrative   Married, 4 children, 9 GGC, Perrin.   Occupation: retired Marine scientist.  Originally from [place] [employer].   Tobacco: quit 1975; smoked pipes and cigars x 15 yrs prior to this.   Alcohol: none.   Exercise: minimal, but is going to sign up for silver sneakers at the [facility].    REVIEW OF SYSTEMS: Constitutional: No fevers, chills, or sweats, no generalized fatigue, change in appetite Eyes: No visual changes, double vision, eye pain Ear, nose and throat: No hearing loss, ear pain, nasal congestion, sore throat Cardiovascular: No chest pain, palpitations Respiratory:  No shortness of breath at rest or with exertion, wheezes GastrointestinaI: No nausea, vomiting, diarrhea, abdominal pain, fecal incontinence Genitourinary:  No dysuria, urinary retention or frequency Musculoskeletal:  No neck pain, back pain Integumentary: No rash, pruritus, skin lesions Neurological: as above Psychiatric: No depression, insomnia, anxiety Endocrine: No palpitations, fatigue, diaphoresis, mood swings, change in appetite, change in weight, increased thirst Hematologic/Lymphatic:  No purpura, petechiae. Allergic/Immunologic: no itchy/runny eyes, nasal congestion, recent allergic reactions, rashes  PHYSICAL EXAM: Vitals:   02/17/17  1356  BP: 110/62  Pulse: 84   General: No acute distress.  Patient appears well-groomed.  Head:  Normocephalic/atraumatic Eyes:  fundi examined but not visualized Neck: supple, no paraspinal tenderness, full range of motion Back: No paraspinal tenderness Heart: regular rate and rhythm Lungs: Clear to auscultation bilaterally. Vascular: No carotid bruits. Neurological Exam: Mental status: alert and oriented to person, place, and time, recent and remote memory intact, fund of knowledge intact, attention and concentration intact, speech fluent and not dysarthric, language intact. Cranial nerves: CN I: not tested CN II: pupils equal, round and reactive to light, visual fields intact CN III, IV, VI:  full range of motion, no nystagmus, no ptosis CN V: facial sensation intact CN VII: upper and lower face symmetric CN VIII: hearing intact CN IX, X: gag intact, uvula midline CN XI: sternocleidomastoid and trapezius muscles intact CN XII: tongue midline Bulk & Tone: normal, no fasciculations. Motor:  5/5 throughout Sensation:  Pinprick sensation intact; vibration sensation reduced in right foot. Deep Tendon Reflexes:  2+ throughout, toes downgoing. Finger to nose testing:  Intention tremor on the left (chronic for many years) Heel to shin:  Without dysmetria.  Gait:  Ataxic, veers towards the right.  Able to turn but unable to tandem walk. Romberg with sway.  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 Hypertension Hyperlipidemia  PLAN: Continue Plavix 15m daily Continue statin therapy (LDL at goal of less than 70) Continue blood pressure control (SBP less than 40, DBP less than 90) Mediterranean diet Exercise.  He plans to start tai chi soon (from a chair) As long as he has vestibulopathy, I would continue not to drive. Follow up in 5 months.  Thank you for allowing me to take part in the care of this patient.  AMetta Clines DO  CC:  PShawnie Dapper MD

## 2017-02-19 ENCOUNTER — Ambulatory Visit: Payer: Medicare HMO | Admitting: Rehabilitative and Restorative Service Providers"

## 2017-02-19 DIAGNOSIS — R2689 Other abnormalities of gait and mobility: Secondary | ICD-10-CM

## 2017-02-19 DIAGNOSIS — R42 Dizziness and giddiness: Secondary | ICD-10-CM

## 2017-02-19 DIAGNOSIS — R2681 Unsteadiness on feet: Secondary | ICD-10-CM

## 2017-02-19 NOTE — Patient Instructions (Signed)
Home Exercises 01/25/2017  1) Keep hamstring stretch the same, keep sidelying hip exercises the same with blue theraband, keep heel-toe walking at counter the same  2) Figure 8 walking-add up/down head turns  3) Gaze Stabilization - Tip Card  1.Target must remain in focus, not blurry, and appear stationary while head is in motion. 2.Perform exercises with small head movements (45 to either side of midline). 3.Increase speed of head motion so long as target is in focus. 4.If you wear eyeglasses, be sure you can see target through lens (therapist will give specific instructions for bifocal / progressive lenses). 5.These exercises may provoke dizziness or nausea. Work through these symptoms. If too dizzy, slow head movement slightly. Rest between each exercise. 6.Exercises demand concentration; avoid distractions. 7.For safety, perform standing exercises close to a counter, wall, corner, or next to someone.  Copyright  VHI. All rights reserved.   Gaze Stabilization - Standing Feet Apart   Feet shoulder width apart, keeping eyes on target on wall 3 feet away, tilt head down slightly and move head side to side for 60 seconds. Repeat while moving head up and down for 60 seconds. Put feet together and repeat each head direction 30 seconds each. Do 2-3 sessions per day.   Copyright  VHI. All rights reserved.   Feet Apart (Compliant Surface) Varied Arm Positions - Eyes Closed    Stand on compliant surface: pillow_ with feet 2-4" apart and arms out. Close eyes and visualize upright position. Hold__30__ seconds. Repeat __3__ times per session. Do __2__ sessions per day.  Copyright  VHI. All rights reserved.   Feet Together (Compliant Surface) Head Motion - Eyes Open    With eyes open, standing on compliant surface: _pillow_______, feet together, move head slowly: side to side x 10 reps.  Do __2__ sessions per day.  Copyright  VHI. All rights reserved.   Feet Apart (Compliant  Surface) Head Motion - Eyes Closed    Stand on compliant surface: _pillow__ with feet shoulder width apart. Close eyes and move head slowly side to side x 5 repetitions.  Open your eyes and regain balance.  Repeat again. Do _2___ sessions per day.  Copyright  VHI. All rights reserved.

## 2017-02-19 NOTE — Therapy (Signed)
[facility] [address] [place], [place], [address] Phone: [phone]   Fax:  [phone]  Physical Therapy Treatment  Patient Details  Name: Curtis Jimenez MRN: [id number] Date of Birth: 27-Jul-1936 Referring Provider: Ricardo Jericho, MD  Encounter Date: 02/19/2017      PT End of Session - 02/19/17 1225    Visit Number 11   Number of Visits 16   Date for PT Re-Evaluation 02/28/17   Authorization Type G code every 10th visit ($40 copay)   PT Start Time 1020   PT Stop Time 1105   PT Time Calculation (min) 45 min   Equipment Utilized During Treatment Gait belt   Activity Tolerance Patient tolerated treatment well   Behavior During Therapy WFL for tasks assessed/performed      Past Medical History:  Diagnosis Date  . Anemia   . Arthritis   . Basal cell carcinoma    neck (skin MD 2X per year)  . Bipolar disorder ([place])   . BPH (benign prostatic hyperplasia)    with elevated PSA; followed by Dr. Rosana [facility] at [facility]  . Cataract   . Chronic renal insufficiency, stage III (moderate)    Last f/u with neph 07/06/16: Cr stable at 1.57 (GFR 42)  . CVA (cerebral vascular accident) ([place]) 11/2016   Pontine (imaging neg), TPA given.  Pt discharged on plavix.  Carotid dopplers ok, echocardiogram ok.  Residual deficit: vertigo but this improved greatly with therapy.  . Gallbladder polyp 2015   Asymptomatic  . GERD (gastroesophageal reflux disease)   . Gout   . Hyperlipidemia   . Hypertension   . Hypothyroidism   . Restless legs syndrome   . S/p cadaver renal transplant 2013   Secondary to HTN +lithium toxicity over 30 yrs caused kidney destruction [facility] transplant MDs)    Past Surgical History:  Procedure Laterality Date  . AV FISTULA PLACEMENT  04/08/2012   Procedure: ARTERIOVENOUS (AV) FISTULA CREATION;  Surgeon: Angelia Mould, MD;  Location[facility];  Service: Vascular;  Laterality: Left;  Creation  of left brachial cephalic arteriovenous fistula  . COLONOSCOPY  2013   Normal except diverticulosis (recall 10 yrs)  . dilation for GERD    . KIDNEY TRANSPLANT  11/06/12   [place] (cadaveric)  . PROSTATE BIOPSY  2011   no malignancy  . TRANSTHORACIC ECHOCARDIOGRAM  11/2016   Normal LV systolic fxn, EF 26-83%.  Grade I DD.  Mild aortic root dilatation, mild MV regurg.    There were no vitals filed for this visit.      Subjective Assessment - 02/19/17 1019    Subjective The patient notes that his back has improved.  He is still c/o some dizziness/imbalance with head motion and when in busy environments (returned to church for first time on Sunday).     Patient Stated Goals Focus on turning.  Pt would still like to have more confidence with dual task in standing-standing at check out counter and managing debit card/wallet, take communion in standing without fear of falling   Currently in Pain? No/denies                         [facility] Adult PT Treatment/Exercise - 02/19/17 1229      Neuro Re-ed    Neuro Re-ed Details  Sensory organization testing scoring 39% (compared to 65% normative) with WNLs use of somatosensory and visual feedback and impaired use of vestibular inputs  0%, as compared to normative value of 50%.                                    Performed corner balance progression adding foam/compliant surfaces, head motion with eyes open + eyes closed.  Performed 180 degree and 360 degree turns emphasizing increasing speed of motion.  VOR x 1 viewing emphasizing faster pace, and larger amplitude movement (however limited by neck stiffness).                 PT Education - 02/19/17 1104    Education provided Yes   Education Details HEP: progressed balance portion of HEP to include compliant surface (feet narrowing eyes closed, feet apart head motion eyes closed, feet together eyes open head motion)   Person(s) Educated Patient   Methods  Explanation;Demonstration;Handout   Comprehension Verbalized understanding;Returned demonstration          PT Short Term Goals - 01/28/17 1108      PT SHORT TERM GOAL #1   Title The patient will return demo HEP independently with written instruction.   Baseline Target date 01/30/2017   Status Achieved     PT SHORT TERM GOAL #2   Title The patient will improve Berg from 40/56 up to 45/56 to demo dec'ing risk for falls.   Baseline 48/56 on 01/28/17   Status Achieved     PT SHORT TERM GOAL #3   Title The patient will ambulate household surfaces without a device independently x 300 ft in clinic (and per subjective report for home).   Baseline met on 01/28/17   Status Achieved     PT SHORT TERM GOAL #4   Title The patient will improve gait speed without device from 2.7 ft/sec to > or equal to 3.1 ft/sec to demo increasing mobility for community activities.   Baseline Patient ambulates 3.1 ft/sec on 01/28/17   Status Achieved     PT SHORT TERM GOAL #5   Title The patient will ambulate in busy environment carrying a plate (with SPC if needed) in order to return to weekly lunch outing with friends.   Baseline Met per subjective reports on 01/28/17   Status Achieved     PT SHORT TERM GOAL #6   Title The patient will be further assessed on FGA to establish baseline for LTG.   Baseline Target date 01/30/2017   Status Achieved     PT SHORT TERM GOAL #7   Title Pt will improve balance and decrease falls risk during dynamic gait as indicated by FGA score of >24/30   Baseline Improved from 15/30 up to 19/30 on 01/28/17   Status Partially Met           PT Long Term Goals - 02/12/17 1055      PT LONG TERM GOAL #1   Title The patient will return to [facility] routine with guidance from PT on appropriate activities and will participate in community TAI CHI class in March (REVISED 02/12/17)   Baseline Target date 02/28/17   Time 8   Period Weeks   Status Revised     PT LONG TERM GOAL #2   Title The  patient will improve Berg score from 40/56 to > or equal to 49/56 to demonstrate improving high level balance.   Baseline Target date 02/28/17   Status On-going     PT LONG TERM GOAL #3   Title  The patient will improve TUG from 14.34 seconds to < or equal to 13 seconds without a device to demo dec'ing risk for falls.   Baseline MET 02/12/2017-11 seconds without AD   Status Achieved     PT LONG TERM GOAL #4   Title The patient will negotiate 4 steps (x 3 reps) without handrails with reciprocal pattern independently.    Baseline MET 02/12/2017   Status Achieved     PT LONG TERM GOAL #5   Title The patient will return to community ambulation without a device independently including level/unlevel surfaces (grass, curbs, inclines).   Baseline Target date 02/28/17   Status On-going     PT LONG TERM GOAL #6   Title The patient will improve FGA from 10 points over established baseline (see STGs)   Baseline Target date 02/28/17   Time 8   Period Weeks   Status On-going     PT LONG TERM GOAL #7   Title Improve Neuro QOL: LE by > or equal to 15% (baseline is 44.7%).   Baseline Target date 02/28/17   Status On-going               Plan - 02/19/17 1226    Clinical Impression Statement The patient arrived today reporting difficulty with busy environments noting sensation of dizziness and general unsteadiness.  Therefore, PT performed SOT and found diminished use of vestibular inputs for balance (difficulty with conditions 5,6) and reliance on inaccurate visual information (difficulty with condition 3).  The patient and PT discussed progression of HEP (see pt instructions) and continuation of PT x 4 more weeks (at 1x/week) to continue working on specific impairments.     Rehab Potential Good   PT Frequency 1x / week   PT Duration 4 weeks   PT Treatment/Interventions ADLs/Self Care Home Management;Therapeutic activities;Therapeutic exercise;Balance training;Neuromuscular  re-education;Vestibular;Patient/family education;Functional mobility training;Gait training;Stair training;DME Instruction   PT Next Visit Plan Check goals next session and renew x 1x/week for 4 weeks; compliant surfaces + eyes closed; compliant surfaces with busy background/head motion, dual tasking, turns, starts/stops with changes in speed.     PT Home Exercise Plan ? Add 360 degree turns to current HEP?  if not too much for patient   Consulted and Agree with Plan of Care Patient      Patient will benefit from skilled therapeutic intervention in order to improve the following deficits and impairments:  Abnormal gait, Decreased balance, Difficulty walking, Dizziness, Postural dysfunction, Decreased mobility, Impaired flexibility  Visit Diagnosis: Other abnormalities of gait and mobility  Dizziness and giddiness  Unsteadiness on feet     Problem List Patient Active Problem List   Diagnosis Date Noted  . Stroke ([place]) 11/30/2016  . Essential hypertension 01/16/2015  . S/p cadaver renal transplant 11/06/2012  . Chronic kidney disease (CKD), stage IV (severe) ([place]) 03/18/2012  . BENIGN PROSTATIC HYPERTROPHY 04/18/2008  . EXTRINSIC ASTHMA, UNSPECIFIED 12/19/2007  . PSA, INCREASED 12/09/2007  . Hypothyroidism 08/12/2007  . Bipolar disorder ([place]) 07/27/2007  . Hyperlipidemia 07/07/2007  . Gout 07/07/2007  . HYPERTENSION 07/07/2007  . GERD 07/07/2007    Tamarion Haymond, PT 02/19/2017, 3:05 PM  [facility] [address] [place], [place], [address] Phone: [phone]   Fax:  [phone]  Name: Curtis Jimenez MRN: [id number] Date of Birth: 1936-07-20

## 2017-02-22 ENCOUNTER — Encounter: Payer: Medicare HMO | Admitting: Rehabilitative and Restorative Service Providers"

## 2017-02-23 ENCOUNTER — Ambulatory Visit: Payer: Medicare HMO | Admitting: Neurology

## 2017-02-26 ENCOUNTER — Encounter: Payer: Medicare HMO | Admitting: Rehabilitative and Restorative Service Providers"

## 2017-02-26 ENCOUNTER — Ambulatory Visit: Payer: Medicare HMO | Attending: Family Medicine | Admitting: Physical Therapy

## 2017-02-26 ENCOUNTER — Encounter: Payer: Self-pay | Admitting: Physical Therapy

## 2017-02-26 DIAGNOSIS — R2689 Other abnormalities of gait and mobility: Secondary | ICD-10-CM | POA: Insufficient documentation

## 2017-02-26 DIAGNOSIS — R2681 Unsteadiness on feet: Secondary | ICD-10-CM

## 2017-02-26 DIAGNOSIS — R42 Dizziness and giddiness: Secondary | ICD-10-CM | POA: Diagnosis present

## 2017-02-27 NOTE — Therapy (Signed)
[place] [address] [place], [place], [address] Phone: [phone]   Fax:  [phone]  Physical Therapy Treatment  Patient Details  Name: Curtis Jimenez MRN: [id number] Date of Birth: 1936-06-21 Referring Provider: Ricardo Jericho, MD  Encounter Date: 02/26/2017      PT End of Session - 02/27/17 1221    Visit Number 12   Number of Visits 16   Date for PT Re-Evaluation 88/91/69  new per recertification   Authorization Type G code every 10th visit ($40 copay)   PT Start Time 0937   PT Stop Time 1020   PT Time Calculation (min) 43 min   Activity Tolerance Patient tolerated treatment well   Behavior During Therapy [facility] for tasks assessed/performed      Past Medical History:  Diagnosis Date  . Anemia   . Arthritis   . Basal cell carcinoma    neck (skin MD 2X per year)  . Bipolar disorder ([place])   . BPH (benign prostatic hyperplasia)    with elevated PSA; followed by Dr. Rosana Hoes at [facility]  . Cataract   . Chronic renal insufficiency, stage III (moderate)    Last f/u with neph 07/06/16: Cr stable at 1.57 (GFR 42)  . CVA (cerebral vascular accident) ([place]) 11/2016   Pontine (imaging neg), TPA given.  Pt discharged on plavix.  Carotid dopplers ok, echocardiogram ok.  Residual deficit: vertigo but this improved greatly with therapy.  . Gallbladder polyp 2015   Asymptomatic  . GERD (gastroesophageal reflux disease)   . Gout   . Hyperlipidemia   . Hypertension   . Hypothyroidism   . Restless legs syndrome   . S/p cadaver renal transplant 2013   Secondary to HTN +lithium toxicity over 30 yrs caused kidney destruction [facility] transplant MDs)    Past Surgical History:  Procedure Laterality Date  . AV FISTULA PLACEMENT  04/08/2012   Procedure: ARTERIOVENOUS (AV) FISTULA CREATION;  Surgeon: Angelia Mould, MD;  Location: [facility] OR;  Service: Vascular;  Laterality: Left;  Creation of left brachial  cephalic arteriovenous fistula  . COLONOSCOPY  2013   Normal except diverticulosis (recall 10 yrs)  . dilation for GERD    . KIDNEY TRANSPLANT  11/06/12   [place] (cadaveric)  . PROSTATE BIOPSY  2011   no malignancy  . TRANSTHORACIC ECHOCARDIOGRAM  11/2016   Normal LV systolic fxn, EF 45-03%.  Grade I DD.  Mild aortic root dilatation, mild MV regurg.    There were no vitals filed for this visit.      Subjective Assessment - 02/26/17 0939    Subjective Pt still c/o back soreness; went to [place] yesterday and loved it!  Felt very energized afterwards.  Still reporting some dificulty with alternating attention between activities and dual tasking   Patient is accompained by: Family member   Patient Stated Goals Focus on turning.  Pt would still like to have more confidence with dual task in standing-standing at check out counter and managing debit card/wallet, take communion in standing without fear of falling   Currently in Pain? No/denies            [facility] PT Assessment - 02/26/17 0942      Standardized Balance Assessment   Standardized Balance Assessment Berg Balance Test     Berg Balance Test   Sit to Stand Able to stand without using hands and stabilize independently   Standing Unsupported Able to stand safely 2 minutes  Sitting with Back Unsupported but Feet Supported on Floor or Stool Able to sit safely and securely 2 minutes   Stand to Sit Sits safely with minimal use of hands   Transfers Able to transfer safely, minor use of hands   Standing Unsupported with Eyes Closed Able to stand 10 seconds safely   Standing Ubsupported with Feet Together Able to place feet together independently and stand for 1 minute with supervision   From Standing, Reach Forward with Outstretched Arm Can reach confidently >25 cm (10")   From Standing Position, Pick up Object from [place] to pick up shoe safely and easily   From Standing Position, Turn to Look Behind Over each Shoulder Looks  behind from both sides and weight shifts well   Turn 360 Degrees Able to turn 360 degrees safely but slowly   Standing Unsupported, Alternately Place Feet on Step/Stool Able to stand independently and safely and complete 8 steps in 20 seconds   Standing Unsupported, One Foot in Front Able to place foot tandem independently and hold 30 seconds   Standing on One Leg Able to lift leg independently and hold 5-10 seconds   Total Score 52   Berg comment: 52/56 low falls risk     Functional Gait  Assessment   Gait assessed  Yes   Gait Level Surface Walks 20 ft in less than 5.5 sec, no assistive devices, good speed, no evidence for imbalance, normal gait pattern, deviates no more than 6 in outside of the 12 in walkway width.   Change in Gait Speed Able to smoothly change walking speed without loss of balance or gait deviation. Deviate no more than 6 in outside of the 12 in walkway width.   Gait with Horizontal Head Turns Performs head turns smoothly with slight change in gait velocity (eg, minor disruption to smooth gait path), deviates 6-10 in outside 12 in walkway width, or uses an assistive device.   Gait with Vertical Head Turns Performs head turns with no change in gait. Deviates no more than 6 in outside 12 in walkway width.   Gait and Pivot Turn Pivot turns safely within 3 sec and stops quickly with no loss of balance.   Step Over Obstacle Is able to step over one shoe box (4.5 in total height) without changing gait speed. No evidence of imbalance.   Gait with Narrow Base of Support Is able to ambulate for 10 steps heel to toe with no staggering.   Gait with Eyes Closed Walks 20 ft, slow speed, abnormal gait pattern, evidence for imbalance, deviates 10-15 in outside 12 in walkway width. Requires more than 9 sec to ambulate 20 ft.   Ambulating Backwards Walks 20 ft, no assistive devices, good speed, no evidence for imbalance, normal gait   Steps Alternating feet, no rail.   Total Score 26   FGA  comment: 26/30 low falls risk            [facility] Adult PT Treatment/Exercise - 02/26/17 1141      Ambulation/Gait   Ambulation/Gait Yes   Ambulation/Gait Assistance 5: Supervision   Ambulation/Gait Assistance Details Performed gait outdoors over various uneven surfaces without AD with close supervision over grassy hills due to slight LOB; pt continues to keep gaze focused on ground for increased stability.  Also reports some fatigue after gait outside.   Ambulation Distance (Feet) 1000 Feet   Assistive device None   Gait Pattern Step-through pattern;Trunk flexed  downward gaze   Ambulation Surface  Outdoor;Unlevel;Paved;Gravel;Grass            PT Education - 02/26/17 1152    Education provided Yes   Education Details Goal attainment, areas of continued focus for therapy, POC   Person(s) Educated Patient   Methods Explanation   Comprehension Verbalized understanding          PT Short Term Goals - 01/28/17 1108      PT SHORT TERM GOAL #1   Title The patient will return demo HEP independently with written instruction.   Baseline Target date 01/30/2017   Status Achieved     PT SHORT TERM GOAL #2   Title The patient will improve Berg from 40/56 up to 45/56 to demo dec'ing risk for falls.   Baseline 48/56 on 01/28/17   Status Achieved     PT SHORT TERM GOAL #3   Title The patient will ambulate household surfaces without a device independently x 300 ft in clinic (and per subjective report for home).   Baseline met on 01/28/17   Status Achieved     PT SHORT TERM GOAL #4   Title The patient will improve gait speed without device from 2.7 ft/sec to > or equal to 3.1 ft/sec to demo increasing mobility for community activities.   Baseline Patient ambulates 3.1 ft/sec on 01/28/17   Status Achieved     PT SHORT TERM GOAL #5   Title The patient will ambulate in busy environment carrying a plate (with SPC if needed) in order to return to weekly lunch outing with friends.   Baseline  Met per subjective reports on 01/28/17   Status Achieved     PT SHORT TERM GOAL #6   Title The patient will be further assessed on FGA to establish baseline for LTG.   Baseline Target date 01/30/2017   Status Achieved     PT SHORT TERM GOAL #7   Title Pt will improve balance and decrease falls risk during dynamic gait as indicated by FGA score of >24/30   Baseline Improved from 15/30 up to 19/30 on 01/28/17   Status Partially Met           PT Long Term Goals - 02/27/17 1225      PT LONG TERM GOAL #1   Title The patient will return to [facility] routine with guidance from PT on appropriate activities and will participate in community TAI CHI class in March (REVISED 02/12/17)   Baseline Met 02/26/17   Status Achieved     PT LONG TERM GOAL #2   Title The patient will improve Berg score from 40/56 to > or equal to 49/56 to demonstrate improving high level balance.   Baseline Met 02/26/2017-52/56   Status Achieved     PT LONG TERM GOAL #3   Title The patient will improve TUG from 14.34 seconds to < or equal to 13 seconds without a device to demo dec'ing risk for falls.   Baseline MET 02/12/2017-11 seconds without AD   Status Achieved     PT LONG TERM GOAL #4   Title The patient will negotiate 4 steps (x 3 reps) without handrails with reciprocal pattern independently.    Baseline MET 02/12/2017   Status Achieved     PT LONG TERM GOAL #5   Title The patient will return to community ambulation without a device independently including level/unlevel surfaces (grass, curbs, inclines) and maintain upright gaze and head turns without LOB   Baseline Supervision with downward gaze on 02/26/2017; revised  LTG-NEW TARGET DATE 03/29/2017   Time 4   Period Weeks   Status Revised     Additional Long Term Goals   Additional Long Term Goals Yes     PT LONG TERM GOAL #6   Title The patient will improve FGA from 10 points over established baseline (see STGs)   Baseline Met 02/26/2017-26/30   Time --   Period --    Status Achieved     PT LONG TERM GOAL #7   Title Improve Neuro QOL: LE by > or equal to 15% (baseline is 44.7%).   Baseline NEW TARGET DATE 03/29/2017   Status On-going     PT LONG TERM GOAL #8   Title Pt will improve composite SOT score to >50% through improved use of vestibular system (>30%)    Baseline NEW TARGET DATE 03/29/2017   Time 4   Period Weeks   Status New     PT LONG TERM GOAL  #9   TITLE Pt will tolerate 20-30 minutes aerobic activity during therapy reporting moderate intensity on BORG RPE   Baseline TARGET DATE 03/29/2017   Time 4   Period Weeks   Status New     PT LONG TERM GOAL  #10   TITLE Will improve safety with higher level gait activities as indicated by increase in FGA score of 28/30   Baseline 26/30, TARGET DATE 03/29/2017   Time 4   Period Weeks   Status New            Plan - 02/27/17 1222    Clinical Impression Statement Treatment session today with focus on re-assessment of LTG.  Pt has met 5/7 LTG overall.  Discussed remaining impairments and focus of therapy with pt.  Discussed ongoing goals and recertification for 1x/week x 4 weeks.  Pt continues to verbalize agreement.  Will continue to focus on decreasing dependence on visual feedback and increasing use of vestibular system during gait and dynamic balance, dual tasking and aerobic conditioning.   Rehab Potential Good   PT Frequency 1x / week   PT Duration 4 weeks   PT Treatment/Interventions ADLs/Self Care Home Management;Therapeutic activities;Therapeutic exercise;Balance training;Neuromuscular re-education;Vestibular;Patient/family education;Functional mobility training;Gait training;Stair training;DME Instruction   PT Next Visit Plan Recertified for 4 more weeks; Continue to work on aerobic conditioning, gait outside and gait with upright gaze/more visual stimuli, x 1 viewing with busy background, dual task, balance decreasing visual dependence    PT Home Exercise Plan ? Add 360 degree turns to  current HEP?  if not too much for patient   Consulted and Agree with Plan of Care Patient      Patient will benefit from skilled therapeutic intervention in order to improve the following deficits and impairments:  Abnormal gait, Decreased balance, Difficulty walking, Dizziness, Postural dysfunction, Decreased mobility, Impaired flexibility, Decreased endurance  Visit Diagnosis: Other abnormalities of gait and mobility  Dizziness and giddiness  Unsteadiness on feet     Problem List Patient Active Problem List   Diagnosis Date Noted  . Stroke ([place]) 11/30/2016  . Essential hypertension 01/16/2015  . S/p cadaver renal transplant 11/06/2012  . Chronic kidney disease (CKD), stage IV (severe) ([place]) 03/18/2012  . BENIGN PROSTATIC HYPERTROPHY 04/18/2008  . EXTRINSIC ASTHMA, UNSPECIFIED 12/19/2007  . PSA, INCREASED 12/09/2007  . Hypothyroidism 08/12/2007  . Bipolar disorder ([place]) 07/27/2007  . Hyperlipidemia 07/07/2007  . Gout 07/07/2007  . HYPERTENSION 07/07/2007  . GERD 07/07/2007    Raylene Everts, PT, DPT 02/27/17  12:53 PM   [facility] [address] [place], [place], [address] Phone: [phone]   Fax:  [phone]  Name: Curtis Jimenez MRN: [id number] Date of Birth: August 11, 1936

## 2017-03-05 ENCOUNTER — Ambulatory Visit: Payer: Medicare HMO | Admitting: Rehabilitative and Restorative Service Providers"

## 2017-03-05 DIAGNOSIS — R42 Dizziness and giddiness: Secondary | ICD-10-CM

## 2017-03-05 DIAGNOSIS — R2689 Other abnormalities of gait and mobility: Secondary | ICD-10-CM

## 2017-03-05 DIAGNOSIS — R2681 Unsteadiness on feet: Secondary | ICD-10-CM

## 2017-03-05 NOTE — Therapy (Signed)
[facility] [address] [place], [place], [address] Phone: [phone]   Fax:  [phone]  Physical Therapy Treatment  Patient Details  Name: Curtis Jimenez MRN: [id number] Date of Birth: 01-16-1936 Referring Provider: Ricardo Jericho, MD  Encounter Date: 03/05/2017      PT End of Session - 03/05/17 1022    Visit Number 13   Number of Visits 16   Date for PT Re-Evaluation 58/85/02  new per recertification   Authorization Type G code every 10th visit ($40 copay)   PT Start Time 1020   PT Stop Time 1100   PT Time Calculation (min) 40 min   Activity Tolerance Patient tolerated treatment well   Behavior During Therapy [facility] for tasks assessed/performed      Past Medical History:  Diagnosis Date  . Anemia   . Arthritis   . Basal cell carcinoma    neck (skin MD 2X per year)  . Bipolar disorder ([place])   . BPH (benign prostatic hyperplasia)    with elevated PSA; followed by Dr. Rosana Hoes at [facility]  . Cataract   . Chronic renal insufficiency, stage III (moderate)    Last f/u with neph 07/06/16: Cr stable at 1.57 (GFR 42)  . CVA (cerebral vascular accident) ([place]) 11/2016   Pontine (imaging neg), TPA given.  Pt discharged on plavix.  Carotid dopplers ok, echocardiogram ok.  Residual deficit: vertigo but this improved greatly with therapy.  . Gallbladder polyp 2015   Asymptomatic  . GERD (gastroesophageal reflux disease)   . Gout   . Hyperlipidemia   . Hypertension   . Hypothyroidism   . Restless legs syndrome   . S/p cadaver renal transplant 2013   Secondary to HTN +lithium toxicity over 30 yrs caused kidney destruction [facility] transplant MDs)    Past Surgical History:  Procedure Laterality Date  . AV FISTULA PLACEMENT  04/08/2012   Procedure: ARTERIOVENOUS (AV) FISTULA CREATION;  Surgeon: Angelia Mould, MD;  Location: [facility] OR;  Service: Vascular;  Laterality: Left;  Creation of left brachial  cephalic arteriovenous fistula  . COLONOSCOPY  2013   Normal except diverticulosis (recall 10 yrs)  . dilation for GERD    . KIDNEY TRANSPLANT  11/06/12   [place] (cadaveric)  . PROSTATE BIOPSY  2011   no malignancy  . TRANSTHORACIC ECHOCARDIOGRAM  11/2016   Normal LV systolic fxn, EF 77-41%.  Grade I DD.  Mild aortic root dilatation, mild MV regurg.    There were no vitals filed for this visit.      Subjective Assessment - 03/05/17 1022    Subjective The patient is doing Tai Chi and is sitting down.  Plans to transition to stand as he feels able.   Patient Stated Goals Focus on turning.  Pt would still like to have more confidence with dual task in standing-standing at check out counter and managing debit card/wallet, take communion in standing without fear of falling   Currently in Pain? No/denies                         [facility] Adult PT Treatment/Exercise - 03/05/17 1045      Ambulation/Gait   Ambulation/Gait Yes   Ambulation/Gait Assistance 5: Supervision   Ambulation/Gait Assistance Details Performed figure 8 walking emphasizing speed of turns and increasing speed of movement, walking to target/bending to touch/return to compliant mat and touch target for turns during functional tasks, outdoor ambulation without a  device with supervision negotiating rubber mulch, gravel, grass, pinestraw, and curbs.  Gait with ball toss vertically and with horizontal passing of ball.  Gait with visual stimulation/increased visual flow using busy background + reacher to have visual target moving in all directions.   Ambulation Distance (Feet) 2000 Feet  or greater/ emphasized endurance x 18 min s rest   Assistive device None   Ambulation Surface Outdoor;Unlevel;Paved;Gravel;Grass;Indoor   Gait Comments Also performed dynamic gait activities with quick starts/stops, backwards walking, sidestepping R/L, quick turns.     Neuro Re-ed    Neuro Re-ed Details  Compliant surface  negotiation performing alternating cone taps R/L LE, 360 degree turns, stepping over/back to targets, marching in place.  Marching with eyes closed with CGA for safety; walking with eyes closed with CGA for safety.  1/4 turns with eyes closed increasing to full turns with CGA.  Also performed kinesthetic sense exercises with eyes closed and turning to face targets to decrease visual reliance.                 PT Education - 03/05/17 1215    Education provided Yes   Education Details recommended patient enter into busy environments to tolerance and try to walk without device gradually (once in restaurant, don't use cane to walk to restroom; at meeting, don't use cane once arrived)   Person(s) Educated Patient   Methods Explanation   Comprehension Verbalized understanding          PT Short Term Goals - 01/28/17 1108      PT SHORT TERM GOAL #1   Title The patient will return demo HEP independently with written instruction.   Baseline Target date 01/30/2017   Status Achieved     PT SHORT TERM GOAL #2   Title The patient will improve Berg from 40/56 up to 45/56 to demo dec'ing risk for falls.   Baseline 48/56 on 01/28/17   Status Achieved     PT SHORT TERM GOAL #3   Title The patient will ambulate household surfaces without a device independently x 300 ft in clinic (and per subjective report for home).   Baseline met on 01/28/17   Status Achieved     PT SHORT TERM GOAL #4   Title The patient will improve gait speed without device from 2.7 ft/sec to > or equal to 3.1 ft/sec to demo increasing mobility for community activities.   Baseline Patient ambulates 3.1 ft/sec on 01/28/17   Status Achieved     PT SHORT TERM GOAL #5   Title The patient will ambulate in busy environment carrying a plate (with SPC if needed) in order to return to weekly lunch outing with friends.   Baseline Met per subjective reports on 01/28/17   Status Achieved     PT SHORT TERM GOAL #6   Title The patient will  be further assessed on FGA to establish baseline for LTG.   Baseline Target date 01/30/2017   Status Achieved     PT SHORT TERM GOAL #7   Title Pt will improve balance and decrease falls risk during dynamic gait as indicated by FGA score of >24/30   Baseline Improved from 15/30 up to 19/30 on 01/28/17   Status Partially Met           PT Long Term Goals - 02/27/17 1225      PT LONG TERM GOAL #1   Title The patient will return to [facility] routine with guidance from PT on appropriate activities  and will participate in community TAI CHI class in March (REVISED 02/12/17)   Baseline Met 02/26/17   Status Achieved     PT LONG TERM GOAL #2   Title The patient will improve Berg score from 40/56 to > or equal to 49/56 to demonstrate improving high level balance.   Baseline Met 02/26/2017-52/56   Status Achieved     PT LONG TERM GOAL #3   Title The patient will improve TUG from 14.34 seconds to < or equal to 13 seconds without a device to demo dec'ing risk for falls.   Baseline MET 02/12/2017-11 seconds without AD   Status Achieved     PT LONG TERM GOAL #4   Title The patient will negotiate 4 steps (x 3 reps) without handrails with reciprocal pattern independently.    Baseline MET 02/12/2017   Status Achieved     PT LONG TERM GOAL #5   Title The patient will return to community ambulation without a device independently including level/unlevel surfaces (grass, curbs, inclines) and maintain upright gaze and head turns without LOB   Baseline Supervision with downward gaze on 02/26/2017; revised LTG-NEW TARGET DATE 03/29/2017   Time 4   Period Weeks   Status Revised     Additional Long Term Goals   Additional Long Term Goals Yes     PT LONG TERM GOAL #6   Title The patient will improve FGA from 10 points over established baseline (see STGs)   Baseline Met 02/26/2017-26/30   Time --   Period --   Status Achieved     PT LONG TERM GOAL #7   Title Improve Neuro QOL: LE by > or equal to 15% (baseline is  44.7%).   Baseline NEW TARGET DATE 03/29/2017   Status On-going     PT LONG TERM GOAL #8   Title Pt will improve composite SOT score to >50% through improved use of vestibular system (>30%)    Baseline NEW TARGET DATE 03/29/2017   Time 4   Period Weeks   Status New     PT LONG TERM GOAL  #9   TITLE Pt will tolerate 20-30 minutes aerobic activity during therapy reporting moderate intensity on BORG RPE   Baseline TARGET DATE 03/29/2017   Time 4   Period Weeks   Status New     PT LONG TERM GOAL  #10   TITLE Will improve safety with higher level gait activities as indicated by increase in FGA score of 28/30   Baseline 26/30, TARGET DATE 03/29/2017   Time 4   Period Weeks   Status New               Plan - 03/05/17 1219    Clinical Impression Statement The patient is most challenged today when doing turns with eyes closed, marching with eyes closed and increasing visual flow.  He tolerates outdoor surfaces well without a device today with no loss of balance.  Continue to train dec'ing visual feedback and in busy environments.     PT Treatment/Interventions ADLs/Self Care Home Management;Therapeutic activities;Therapeutic exercise;Balance training;Neuromuscular re-education;Vestibular;Patient/family education;Functional mobility training;Gait training;Stair training;DME Instruction   PT Next Visit Plan Continue to work on aerobic conditioning, gait outside and gait with upright gaze/more visual stimuli, x 1 viewing with busy background, dual task, balance decreasing visual dependence    Consulted and Agree with Plan of Care Patient      Patient will benefit from skilled therapeutic intervention in order to improve the following deficits  and impairments:  Abnormal gait, Decreased balance, Difficulty walking, Dizziness, Postural dysfunction, Decreased mobility, Impaired flexibility, Decreased endurance  Visit Diagnosis: Other abnormalities of gait and mobility  Dizziness and  giddiness  Unsteadiness on feet     Problem List Patient Active Problem List   Diagnosis Date Noted  . Stroke ([place]) 11/30/2016  . Essential hypertension 01/16/2015  . S/p cadaver renal transplant 11/06/2012  . Chronic kidney disease (CKD), stage IV (severe) ([place]) 03/18/2012  . BENIGN PROSTATIC HYPERTROPHY 04/18/2008  . EXTRINSIC ASTHMA, UNSPECIFIED 12/19/2007  . PSA, INCREASED 12/09/2007  . Hypothyroidism 08/12/2007  . Bipolar disorder ([place]) 07/27/2007  . Hyperlipidemia 07/07/2007  . Gout 07/07/2007  . HYPERTENSION 07/07/2007  . GERD 07/07/2007    Zadkiel Dragan, PT 03/05/2017, 12:25 PM  [facility] [address] [place], [place], [address] Phone: [phone]   Fax:  [phone]  Name: Curtis Jimenez MRN: [id number] Date of Birth: Oct 26, 1936

## 2017-03-09 ENCOUNTER — Other Ambulatory Visit: Payer: Self-pay

## 2017-03-09 NOTE — Patient Outreach (Signed)
Telephone outreach to patient to obtain mRS was successfully completed. mRS = 2  Josepha Pigg, [place] Management Assistant

## 2017-03-12 ENCOUNTER — Encounter: Payer: Self-pay | Admitting: Physical Therapy

## 2017-03-12 ENCOUNTER — Ambulatory Visit: Payer: Medicare HMO | Admitting: Physical Therapy

## 2017-03-12 DIAGNOSIS — Z940 Kidney transplant status: Secondary | ICD-10-CM | POA: Diagnosis not present

## 2017-03-12 DIAGNOSIS — R2689 Other abnormalities of gait and mobility: Secondary | ICD-10-CM

## 2017-03-12 DIAGNOSIS — R42 Dizziness and giddiness: Secondary | ICD-10-CM

## 2017-03-12 DIAGNOSIS — R2681 Unsteadiness on feet: Secondary | ICD-10-CM

## 2017-03-12 NOTE — Therapy (Signed)
[facility] [address] [place], [place], [address] Phone: [phone]   Fax:  [phone]  Physical Therapy Treatment  Patient Details  Name: Curtis Jimenez MRN: [id number] Date of Birth: 1936-08-20 Referring Provider: Ricardo Jericho, MD  Encounter Date: 03/12/2017      PT End of Session - 03/12/17 1514    Visit Number 14   Number of Visits 16   Date for PT Re-Evaluation 03/29/17   Authorization Type G code every 10th visit ($40 copay)   PT Start Time 1019   PT Stop Time 1102   PT Time Calculation (min) 43 min   Activity Tolerance Patient tolerated treatment well   Behavior During Therapy [facility] for tasks assessed/performed      Past Medical History:  Diagnosis Date  . Anemia   . Arthritis   . Basal cell carcinoma    neck (skin MD 2X per year)  . Bipolar disorder ([place])   . BPH (benign prostatic hyperplasia)    with elevated PSA; followed by Dr. Rosana Hoes at [facility]  . Cataract   . Chronic renal insufficiency, stage III (moderate)    Last f/u with neph 07/06/16: Cr stable at 1.57 (GFR 42)  . CVA (cerebral vascular accident) ([place]) 11/2016   Pontine (imaging neg), TPA given.  Pt discharged on plavix.  Carotid dopplers ok, echocardiogram ok.  Residual deficit: vertigo but this improved greatly with therapy.  . Gallbladder polyp 2015   Asymptomatic  . GERD (gastroesophageal reflux disease)   . Gout   . Hyperlipidemia   . Hypertension   . Hypothyroidism   . Restless legs syndrome   . S/p cadaver renal transplant 2013   Secondary to HTN +lithium toxicity over 30 yrs caused kidney destruction [facility] transplant MDs)    Past Surgical History:  Procedure Laterality Date  . AV FISTULA PLACEMENT  04/08/2012   Procedure: ARTERIOVENOUS (AV) FISTULA CREATION;  Surgeon: Angelia Mould, MD;  Location: [facility] OR;  Service: Vascular;  Laterality: Left;  Creation of left brachial cephalic arteriovenous fistula   . COLONOSCOPY  2013   Normal except diverticulosis (recall 10 yrs)  . dilation for GERD    . KIDNEY TRANSPLANT  11/06/12   [place] (cadaveric)  . PROSTATE BIOPSY  2011   no malignancy  . TRANSTHORACIC ECHOCARDIOGRAM  11/2016   Normal LV systolic fxn, EF 89-37%.  Grade I DD.  Mild aortic root dilatation, mild MV regurg.    There were no vitals filed for this visit.      Subjective Assessment - 03/12/17 1021    Subjective Doing Tai Chi half and half now; has "ditched" the cane and has been walking around the community and in grocery stores without the cane.  No issues but head turns still cause him to lose his focus   Patient Stated Goals Focus on turning.  Pt would still like to have more confidence with dual task in standing-standing at check out counter and managing debit card/wallet, take communion in standing without fear of falling   Currently in Pain? No/denies           [facility] Adult PT Treatment/Exercise - 03/12/17 1023      Ambulation/Gait   Ramp 4: Min assist   Ramp Details (indicate cue type and reason) performed alternating stepping forwards onto/off of ramp EO, EC, standing on ramp stepping forwards/back EO and EC facing up ramp   Curb 4: Min assist   Curb Details (indicate cue type  and reason) performed RLE lateral and forwards step onto/off of curb x 10 each with EO, EC to focus on lateral weight shifting and stance control on LLE     Knee/Hip Exercises: Aerobic   Tread Mill x 8 minutes at 1.2 mph with vertical and horizontal head turns with bilat > one UE support and min A with verbal cues to maintain wide BOS and full step/stride length, heel strike and foot clearance.  Pt continues to present with increased difficulty with hip extension, full step length and heel strike and R LE ADD at faster speeds and with head turns.         Vestibular Treatment/Exercise - 03/12/17 1101      Vestibular Treatment/Exercise   Gaze Exercises X1 Viewing Horizontal     X1  Viewing Horizontal   Foot Position walking forwards   Reps 4   Comments with letter on busy background           PT Education - 03/12/17 1513    Education provided Yes   Education Details add busy background to x 1 viewing; add marching and gait forwards to x 1 viewing   Person(s) Educated Patient   Methods Explanation   Comprehension Verbalized understanding          PT Short Term Goals - 01/28/17 1108      PT SHORT TERM GOAL #1   Title The patient will return demo HEP independently with written instruction.   Baseline Target date 01/30/2017   Status Achieved     PT SHORT TERM GOAL #2   Title The patient will improve Berg from 40/56 up to 45/56 to demo dec'ing risk for falls.   Baseline 48/56 on 01/28/17   Status Achieved     PT SHORT TERM GOAL #3   Title The patient will ambulate household surfaces without a device independently x 300 ft in clinic (and per subjective report for home).   Baseline met on 01/28/17   Status Achieved     PT SHORT TERM GOAL #4   Title The patient will improve gait speed without device from 2.7 ft/sec to > or equal to 3.1 ft/sec to demo increasing mobility for community activities.   Baseline Patient ambulates 3.1 ft/sec on 01/28/17   Status Achieved     PT SHORT TERM GOAL #5   Title The patient will ambulate in busy environment carrying a plate (with SPC if needed) in order to return to weekly lunch outing with friends.   Baseline Met per subjective reports on 01/28/17   Status Achieved     PT SHORT TERM GOAL #6   Title The patient will be further assessed on FGA to establish baseline for LTG.   Baseline Target date 01/30/2017   Status Achieved     PT SHORT TERM GOAL #7   Title Pt will improve balance and decrease falls risk during dynamic gait as indicated by FGA score of >24/30   Baseline Improved from 15/30 up to 19/30 on 01/28/17   Status Partially Met           PT Long Term Goals - 03/12/17 1516      PT LONG TERM GOAL #5   Title  The patient will return to community ambulation without a device independently including level/unlevel surfaces (grass, curbs, inclines) and maintain upright gaze and head turns without LOB   Baseline Supervision with downward gaze on 02/26/2017; revised LTG-NEW TARGET DATE 03/29/2017   Status Revised  PT LONG TERM GOAL #7   Title Improve Neuro QOL: LE by > or equal to 15% (baseline is 44.7%).   Baseline NEW TARGET DATE 03/29/2017   Status On-going     PT LONG TERM GOAL #8   Title Pt will improve composite SOT score to >50% through improved use of vestibular system (>30%)    Baseline NEW TARGET DATE 03/29/2017   Status New     PT LONG TERM GOAL  #9   TITLE Pt will tolerate 20-30 minutes aerobic activity during therapy reporting moderate intensity on BORG RPE   Baseline TARGET DATE 03/29/2017   Status New     PT LONG TERM GOAL  #10   TITLE Will improve safety with higher level gait activities as indicated by increase in FGA score of 28/30   Baseline 26/30, TARGET DATE 03/29/2017   Status New             Plan - 03/12/17 1514    Clinical Impression Statement Pt continues to be most challenged with continuous gait, faster gait speeds and gait with head turns/visual disruption.  Continued to focus on balance and use of proprioceptive and vestibular input with eyes closed and busy background during x 1 viewing.  With increased challenges pt is making good progress towards LTG.   PT Treatment/Interventions ADLs/Self Care Home Management;Therapeutic activities;Therapeutic exercise;Balance training;Neuromuscular re-education;Vestibular;Patient/family education;Functional mobility training;Gait training;Stair training;DME Instruction   PT Next Visit Plan Continue to work on aerobic conditioning, gait outside and gait with upright gaze/more visual stimuli, x 1 viewing with busy background, dual task, balance decreasing visual dependence    Consulted and Agree with Plan of Care Patient      Patient  will benefit from skilled therapeutic intervention in order to improve the following deficits and impairments:  Abnormal gait, Decreased balance, Difficulty walking, Dizziness, Postural dysfunction, Decreased mobility, Impaired flexibility, Decreased endurance  Visit Diagnosis: Other abnormalities of gait and mobility  Dizziness and giddiness  Unsteadiness on feet     Problem List Patient Active Problem List   Diagnosis Date Noted  . Stroke ([place]) 11/30/2016  . Essential hypertension 01/16/2015  . S/p cadaver renal transplant 11/06/2012  . Chronic kidney disease (CKD), stage IV (severe) ([place]) 03/18/2012  . BENIGN PROSTATIC HYPERTROPHY 04/18/2008  . EXTRINSIC ASTHMA, UNSPECIFIED 12/19/2007  . PSA, INCREASED 12/09/2007  . Hypothyroidism 08/12/2007  . Bipolar disorder ([place]) 07/27/2007  . Hyperlipidemia 07/07/2007  . Gout 07/07/2007  . HYPERTENSION 07/07/2007  . GERD 07/07/2007   Raylene Everts, PT, DPT 03/12/17    3:18 PM    [facility] [address] [place], [place], [address] Phone: [phone]   Fax:  [phone]  Name: Curtis Jimenez MRN: [id number] Date of Birth: 1936/11/26

## 2017-03-17 DIAGNOSIS — E669 Obesity, unspecified: Secondary | ICD-10-CM | POA: Diagnosis not present

## 2017-03-17 DIAGNOSIS — K824 Cholesterolosis of gallbladder: Secondary | ICD-10-CM | POA: Diagnosis not present

## 2017-03-17 DIAGNOSIS — Z940 Kidney transplant status: Secondary | ICD-10-CM | POA: Diagnosis not present

## 2017-03-17 DIAGNOSIS — M109 Gout, unspecified: Secondary | ICD-10-CM | POA: Diagnosis not present

## 2017-03-17 DIAGNOSIS — I129 Hypertensive chronic kidney disease with stage 1 through stage 4 chronic kidney disease, or unspecified chronic kidney disease: Secondary | ICD-10-CM | POA: Diagnosis not present

## 2017-03-17 DIAGNOSIS — R739 Hyperglycemia, unspecified: Secondary | ICD-10-CM | POA: Diagnosis not present

## 2017-03-19 ENCOUNTER — Ambulatory Visit: Payer: Medicare HMO | Admitting: Physical Therapy

## 2017-03-19 ENCOUNTER — Encounter: Payer: Self-pay | Admitting: Physical Therapy

## 2017-03-19 DIAGNOSIS — R2689 Other abnormalities of gait and mobility: Secondary | ICD-10-CM | POA: Diagnosis not present

## 2017-03-19 DIAGNOSIS — R42 Dizziness and giddiness: Secondary | ICD-10-CM

## 2017-03-19 DIAGNOSIS — R2681 Unsteadiness on feet: Secondary | ICD-10-CM

## 2017-03-19 NOTE — Patient Instructions (Signed)
Forward [facility]    Feet together, bend torso and hips forward, reach for your feet, keep back and knees straight. Exhale and release over legs. Hold for _30___ seconds.  http://yg.exer.us/36   Copyright  VHI. All rights reserved.   ABDUCTION: Standing - Resistance Band (Active)    Stand, feet flat. Against yellow resistance band, lift right leg out to side. Complete _2__ sets of _12__ repetitions with blue theraband. Perform _1-2__ sessions per day.  http://gtsc.exer.us/116   Copyright  VHI. All rights reserved.   Feet Heel-Toe "Tandem"    Hold on to counter, walk a straight line bringing one foot directly in front of the other, hold each step while looking up <> down then take next step Repeat for 10-12 steps per session. Do _1-2__ sessions per day.  Copyright  VHI. All rights reserved.   Continue figure 8 walking with up/down and side to side head turns   3) Gaze Stabilization - Tip Card 1.Target must remain in focus, not blurry, and appear stationary while head is in motion. 2.Perform exercises with small head movements (45 to either side of midline). 3.Increase speed of head motion so long as target is in focus. 4.If you wear eyeglasses, be sure you can see target through lens (therapist will give specific instructions for bifocal / progressive lenses). 5.These exercises may provoke dizziness or nausea. Work through these symptoms. If too dizzy, slow head movement slightly. Rest between each exercise. 6.Exercises demand concentration; avoid distractions. 7.For safety, perform standing exercises close to a counter, wall, corner, or next to someone.  Copyright  VHI. All rights reserved.  Gaze Stabilization - Standing Feet Apart   Feet marching in place, keeping eyes on target on wall 3 feet away, tilt head down slightly and move head side to side for 30 seconds. Repeat while moving head up and down for 30 seconds.  Do 2-3 sessions per  day.   Copyright  VHI. All rights reserved.  Feet Apart (Compliant Surface) Varied Arm Positions - Eyes Closed    Stand on compliant surface: pillow_ with feet 2-4" apart and arms out. Close eyes and visualize upright position. Hold__30__ seconds. Repeat __3__ times per session. Do __2__ sessions per day.  Copyright  VHI. All rights reserved.   Feet Together (Compliant Surface) Head Motion - Eyes Open    With eyes open, standing on compliant surface: _pillow_______, feet together, move head slowly: side to side x 10 reps.  Do __2__ sessions per day.  Copyright  VHI. All rights reserved.   Feet Apart (Compliant Surface) Head Motion - Eyes Closed    Stand on compliant surface: _pillow__ with feet shoulder width apart. Close eyes and move head slowly side to side x 5 repetitions.  Open your eyes and regain balance.  Repeat again. Do _2___ sessions per day.  Copyright  VHI. All rights reserved.

## 2017-03-19 NOTE — Therapy (Signed)
[facility] [address] [place], [place], [address] Phone: [phone]   Fax:  [phone]  Physical Therapy Treatment  Patient Details  Name: Curtis Jimenez MRN: [id number] Date of Birth: 10/19/1936 Referring Provider: Ricardo Jericho, MD  Encounter Date: 03/19/2017      PT End of Session - 03/19/17 1309    Visit Number 15   Number of Visits 16   Date for PT Re-Evaluation 03/29/17   Authorization Type G code every 10th visit ($40 copay)   PT Start Time 1018   PT Stop Time 1102   PT Time Calculation (min) 44 min   Activity Tolerance Patient tolerated treatment well   Behavior During Therapy [facility] for tasks assessed/performed      Past Medical History:  Diagnosis Date  . Anemia   . Arthritis   . Basal cell carcinoma    neck (skin MD 2X per year)  . Bipolar disorder ([place])   . BPH (benign prostatic hyperplasia)    with elevated PSA; followed by Dr. Rosana Hoes at [facility]  . Cataract   . Chronic renal insufficiency, stage III (moderate)    Last f/u with neph 07/06/16: Cr stable at 1.57 (GFR 42)  . CVA (cerebral vascular accident) ([place]) 11/2016   Pontine (imaging neg), TPA given.  Pt discharged on plavix.  Carotid dopplers ok, echocardiogram ok.  Residual deficit: vertigo but this improved greatly with therapy.  . Gallbladder polyp 2015   Asymptomatic  . GERD (gastroesophageal reflux disease)   . Gout   . Hyperlipidemia   . Hypertension   . Hypothyroidism   . Restless legs syndrome   . S/p cadaver renal transplant 2013   Secondary to HTN +lithium toxicity over 30 yrs caused kidney destruction [facility] transplant MDs)    Past Surgical History:  Procedure Laterality Date  . AV FISTULA PLACEMENT  04/08/2012   Procedure: ARTERIOVENOUS (AV) FISTULA CREATION;  Surgeon: Angelia Mould, MD;  Location: [facility] OR;  Service: Vascular;  Laterality: Left;  Creation of left brachial cephalic arteriovenous fistula   . COLONOSCOPY  2013   Normal except diverticulosis (recall 10 yrs)  . dilation for GERD    . KIDNEY TRANSPLANT  11/06/12   [place] (cadaveric)  . PROSTATE BIOPSY  2011   no malignancy  . TRANSTHORACIC ECHOCARDIOGRAM  11/2016   Normal LV systolic fxn, EF 46-27%.  Grade I DD.  Mild aortic root dilatation, mild MV regurg.    There were no vitals filed for this visit.      Subjective Assessment - 03/19/17 1022    Subjective Still doing Tai Chi half sitting and half standing; is now able to put his sweater on over his head in standing and not lose his balance.  When fatigued he still feels some vertigo.   Patient Stated Goals Focus on turning.  Pt would still like to have more confidence with dual task in standing-standing at check out counter and managing debit card/wallet, take communion in standing without fear of falling   Currently in Pain? No/denies          [facility] Adult PT Treatment/Exercise - 03/19/17 1305      Knee/Hip Exercises: Stretches   Active Hamstring Stretch Both;2 reps;30 seconds   Active Hamstring Stretch Limitations standing     Knee/Hip Exercises: Standing   Hip Abduction Stengthening;Right;Left;1 set;10 reps;Knee straight   Abduction Limitations with blue theraband         Vestibular Treatment/Exercise - 03/19/17 1307  Vestibular Treatment/Exercise   Gaze Exercises X1 Viewing Horizontal;X1 Viewing Vertical     X1 Viewing Horizontal   Foot Position marching   Reps 1   Comments 30 seconds, blank background     X1 Viewing Vertical   Foot Position marching   Reps 1   Comments 30 seconds, blank background            Balance Exercises - 03/19/17 1306      Balance Exercises: Standing   Standing Eyes Opened Narrow base of support (BOS);Head turns;Foam/compliant surface;10 secs  10 reps head turns   Standing Eyes Closed Narrow base of support (BOS);Wide (BOA);Foam/compliant surface;2 reps;30 secs   Tandem Gait Forward;Intermittent upper  extremity support;2 reps;Other (comment)  adding in vertical head turns           PT Education - 03/19/17 1308    Education provided Yes   Education Details HEP progression   Person(s) Educated Patient   Methods Explanation;Handout   Comprehension Verbalized understanding;Returned demonstration          PT Short Term Goals - 01/28/17 1108      PT SHORT TERM GOAL #1   Title The patient will return demo HEP independently with written instruction.   Baseline Target date 01/30/2017   Status Achieved     PT SHORT TERM GOAL #2   Title The patient will improve Berg from 40/56 up to 45/56 to demo dec'ing risk for falls.   Baseline 48/56 on 01/28/17   Status Achieved     PT SHORT TERM GOAL #3   Title The patient will ambulate household surfaces without a device independently x 300 ft in clinic (and per subjective report for home).   Baseline met on 01/28/17   Status Achieved     PT SHORT TERM GOAL #4   Title The patient will improve gait speed without device from 2.7 ft/sec to > or equal to 3.1 ft/sec to demo increasing mobility for community activities.   Baseline Patient ambulates 3.1 ft/sec on 01/28/17   Status Achieved     PT SHORT TERM GOAL #5   Title The patient will ambulate in busy environment carrying a plate (with SPC if needed) in order to return to weekly lunch outing with friends.   Baseline Met per subjective reports on 01/28/17   Status Achieved     PT SHORT TERM GOAL #6   Title The patient will be further assessed on FGA to establish baseline for LTG.   Baseline Target date 01/30/2017   Status Achieved     PT SHORT TERM GOAL #7   Title Pt will improve balance and decrease falls risk during dynamic gait as indicated by FGA score of >24/30   Baseline Improved from 15/30 up to 19/30 on 01/28/17   Status Partially Met           PT Long Term Goals - 03/12/17 1516      PT LONG TERM GOAL #5   Title The patient will return to community ambulation without a device  independently including level/unlevel surfaces (grass, curbs, inclines) and maintain upright gaze and head turns without LOB   Baseline Supervision with downward gaze on 02/26/2017; revised LTG-NEW TARGET DATE 03/29/2017   Status Revised     PT LONG TERM GOAL #7   Title Improve Neuro QOL: LE by > or equal to 15% (baseline is 44.7%).   Baseline NEW TARGET DATE 03/29/2017   Status On-going     PT LONG TERM  GOAL #8   Title Pt will improve composite SOT score to >50% through improved use of vestibular system (>30%)    Baseline NEW TARGET DATE 03/29/2017   Status New     PT LONG TERM GOAL  #9   TITLE Pt will tolerate 20-30 minutes aerobic activity during therapy reporting moderate intensity on BORG RPE   Baseline TARGET DATE 03/29/2017   Status New     PT LONG TERM GOAL  #10   TITLE Will improve safety with higher level gait activities as indicated by increase in FGA score of 28/30   Baseline 26/30, TARGET DATE 03/29/2017   Status New               Plan - 03/19/17 1309    Clinical Impression Statement Pt continues to make good progress; treatment session today with focus on review of full HEP and progression of HEP for pt to continue after D/C from therapy next week.  Each exercise was reviewed with pt to determine progress and challenges added to each exercise.  New handouts given.  Will re-assess LTG and progress next session and plan to D/C from therapy.   Rehab Potential Good   PT Treatment/Interventions ADLs/Self Care Home Management;Therapeutic activities;Therapeutic exercise;Balance training;Neuromuscular re-education;Vestibular;Patient/family education;Functional mobility training;Gait training;Stair training;DME Instruction   PT Next Visit Plan Recheck LTG and D/C from therapy!!   Consulted and Agree with Plan of Care Patient      Patient will benefit from skilled therapeutic intervention in order to improve the following deficits and impairments:  Abnormal gait, Decreased balance,  Difficulty walking, Dizziness, Postural dysfunction, Decreased mobility, Impaired flexibility, Decreased endurance  Visit Diagnosis: Other abnormalities of gait and mobility  Dizziness and giddiness  Unsteadiness on feet     Problem List Patient Active Problem List   Diagnosis Date Noted  . Stroke ([place]) 11/30/2016  . Essential hypertension 01/16/2015  . S/p cadaver renal transplant 11/06/2012  . Chronic kidney disease (CKD), stage IV (severe) ([place]) 03/18/2012  . BENIGN PROSTATIC HYPERTROPHY 04/18/2008  . EXTRINSIC ASTHMA, UNSPECIFIED 12/19/2007  . PSA, INCREASED 12/09/2007  . Hypothyroidism 08/12/2007  . Bipolar disorder ([place]) 07/27/2007  . Hyperlipidemia 07/07/2007  . Gout 07/07/2007  . HYPERTENSION 07/07/2007  . GERD 07/07/2007   Raylene Everts, PT, DPT 03/19/17    1:12 PM    [facility] [address] [place], [place], [address] Phone: [phone]   Fax:  [phone]  Name: Curtis Jimenez MRN: [id number] Date of Birth: 02/24/1936

## 2017-03-24 ENCOUNTER — Ambulatory Visit: Payer: Medicare HMO | Admitting: Physical Therapy

## 2017-03-24 ENCOUNTER — Encounter: Payer: Self-pay | Admitting: Physical Therapy

## 2017-03-24 DIAGNOSIS — R42 Dizziness and giddiness: Secondary | ICD-10-CM

## 2017-03-24 DIAGNOSIS — R2689 Other abnormalities of gait and mobility: Secondary | ICD-10-CM | POA: Diagnosis not present

## 2017-03-24 DIAGNOSIS — R2681 Unsteadiness on feet: Secondary | ICD-10-CM

## 2017-03-24 NOTE — Therapy (Signed)
[facility] [address] [place], [place], [address] Phone: [phone]   Fax:  [phone]  Physical Therapy Treatment  Patient Details  Name: Curtis Jimenez MRN: [id number] Date of Birth: 07/11/1936 Referring Provider: Ricardo Jericho, MD  Encounter Date: 03/24/2017      PT End of Session - 03/24/17 1622    Visit Number 16   Number of Visits 16   Date for PT Re-Evaluation 03/29/17  D/C today, no further visits needed   Authorization Type G code every 10th visit ($40 copay)   PT Start Time 1446   PT Stop Time 1533   PT Time Calculation (min) 47 min   Activity Tolerance Patient tolerated treatment well   Behavior During Therapy [facility] for tasks assessed/performed      Past Medical History:  Diagnosis Date  . Anemia   . Arthritis   . Basal cell carcinoma    neck (skin MD 2X per year)  . Bipolar disorder ([place])   . BPH (benign prostatic hyperplasia)    with elevated PSA; followed by Dr. Rosana Hoes at [facility]  . Cataract   . Chronic renal insufficiency, stage III (moderate)    Last f/u with neph 07/06/16: Cr stable at 1.57 (GFR 42)  . CVA (cerebral vascular accident) ([place]) 11/2016   Pontine (imaging neg), TPA given.  Pt discharged on plavix.  Carotid dopplers ok, echocardiogram ok.  Residual deficit: vertigo but this improved greatly with therapy.  . Gallbladder polyp 2015   Asymptomatic  . GERD (gastroesophageal reflux disease)   . Gout   . Hyperlipidemia   . Hypertension   . Hypothyroidism   . Restless legs syndrome   . S/p cadaver renal transplant 2013   Secondary to HTN +lithium toxicity over 30 yrs caused kidney destruction [facility] transplant MDs)    Past Surgical History:  Procedure Laterality Date  . AV FISTULA PLACEMENT  04/08/2012   Procedure: ARTERIOVENOUS (AV) FISTULA CREATION;  Surgeon: Angelia Mould, MD;  Location: [facility] OR;  Service: Vascular;  Laterality: Left;  Creation of left  brachial cephalic arteriovenous fistula  . COLONOSCOPY  2013   Normal except diverticulosis (recall 10 yrs)  . dilation for GERD    . KIDNEY TRANSPLANT  11/06/12   [place] (cadaveric)  . PROSTATE BIOPSY  2011   no malignancy  . TRANSTHORACIC ECHOCARDIOGRAM  11/2016   Normal LV systolic fxn, EF 80-22%.  Grade I DD.  Mild aortic root dilatation, mild MV regurg.    There were no vitals filed for this visit.      Subjective Assessment - 03/24/17 1451    Subjective Pt performing new HEP at home-sufficiently challenging.  Feeling a little off balance today but isn't sure why.   Patient Stated Goals Focus on turning.  Pt would still like to have more confidence with dual task in standing-standing at check out counter and managing debit card/wallet, take communion in standing without fear of falling   Currently in Pain? No/denies            [facility] PT Assessment - 03/24/17 1517      Observation/Other Assessments   Focus on Therapeutic Outcomes (FOTO)  74   Neuro Quality of Life  50.7     Functional Gait  Assessment   Gait assessed  Yes   Gait Level Surface Walks 20 ft in less than 5.5 sec, no assistive devices, good speed, no evidence for imbalance, normal gait pattern, deviates no more than  6 in outside of the 12 in walkway width.   Change in Gait Speed Able to smoothly change walking speed without loss of balance or gait deviation. Deviate no more than 6 in outside of the 12 in walkway width.   Gait with Horizontal Head Turns Performs head turns smoothly with slight change in gait velocity (eg, minor disruption to smooth gait path), deviates 6-10 in outside 12 in walkway width, or uses an assistive device.   Gait with Vertical Head Turns Performs head turns with no change in gait. Deviates no more than 6 in outside 12 in walkway width.   Gait and Pivot Turn Pivot turns safely within 3 sec and stops quickly with no loss of balance.   Step Over Obstacle Is able to step over 2 stacked shoe  boxes taped together (9 in total height) without changing gait speed. No evidence of imbalance.   Gait with Narrow Base of Support Is able to ambulate for 10 steps heel to toe with no staggering.   Gait with Eyes Closed Walks 20 ft, slow speed, abnormal gait pattern, evidence for imbalance, deviates 10-15 in outside 12 in walkway width. Requires more than 9 sec to ambulate 20 ft.   Ambulating Backwards Walks 20 ft, no assistive devices, good speed, no evidence for imbalance, normal gait   Steps Alternating feet, no rail.   Total Score 27   FGA comment: 27/30 low falls risk           PT Education - 03/24/17 1622    Education provided Yes   Education Details Continue HEP and community wellness   Person(s) Educated Patient   Methods Explanation   Comprehension Verbalized understanding          PT Short Term Goals - 01/28/17 1108      PT SHORT TERM GOAL #1   Title The patient will return demo HEP independently with written instruction.   Baseline Target date 01/30/2017   Status Achieved     PT SHORT TERM GOAL #2   Title The patient will improve Berg from 40/56 up to 45/56 to demo dec'ing risk for falls.   Baseline 48/56 on 01/28/17   Status Achieved     PT SHORT TERM GOAL #3   Title The patient will ambulate household surfaces without a device independently x 300 ft in clinic (and per subjective report for home).   Baseline met on 01/28/17   Status Achieved     PT SHORT TERM GOAL #4   Title The patient will improve gait speed without device from 2.7 ft/sec to > or equal to 3.1 ft/sec to demo increasing mobility for community activities.   Baseline Patient ambulates 3.1 ft/sec on 01/28/17   Status Achieved     PT SHORT TERM GOAL #5   Title The patient will ambulate in busy environment carrying a plate (with SPC if needed) in order to return to weekly lunch outing with friends.   Baseline Met per subjective reports on 01/28/17   Status Achieved     PT SHORT TERM GOAL #6   Title  The patient will be further assessed on FGA to establish baseline for LTG.   Baseline Target date 01/30/2017   Status Achieved     PT SHORT TERM GOAL #7   Title Pt will improve balance and decrease falls risk during dynamic gait as indicated by FGA score of >24/30   Baseline Improved from 15/30 up to 19/30 on 01/28/17   Status Partially  Met           PT Long Term Goals - 03/24/17 1454      PT LONG TERM GOAL #1   Title The patient will return to [facility] routine with guidance from PT on appropriate activities and will participate in community [employer] [facility] class in March (REVISED 02/12/17)   Baseline Met 02/26/17   Status Achieved     PT LONG TERM GOAL #2   Title The patient will improve Berg score from 40/56 to > or equal to 49/56 to demonstrate improving high level balance.   Baseline Met 02/26/2017-52/56   Status Achieved     PT LONG TERM GOAL #3   Title The patient will improve TUG from 14.34 seconds to < or equal to 13 seconds without a device to demo dec'ing risk for falls.   Baseline MET 02/12/2017-11 seconds without AD   Status Achieved     PT LONG TERM GOAL #4   Title The patient will negotiate 4 steps (x 3 reps) without handrails with reciprocal pattern independently.    Baseline MET 02/12/2017   Status Achieved     PT LONG TERM GOAL #5   Title The patient will return to community ambulation without a device independently including level/unlevel surfaces (grass, curbs, inclines) and maintain upright gaze and head turns without LOB   Baseline Mod I over uneven, outdoor terrain   Status Achieved     PT LONG TERM GOAL #6   Title The patient will improve FGA from 10 points over established baseline (see STGs)   Baseline Met 02/26/2017-26/30   Status Achieved     PT LONG TERM GOAL #7   Title Improve Neuro QOL: LE by > or equal to 15% (baseline is 44.7%).   Baseline 50.7    Status Partially Met     PT LONG TERM GOAL #8   Title Pt will improve composite SOT score to >50% through  improved use of vestibular system (>30%)    Baseline 48%; vestibular system still close to 0%   Status Partially Met     PT LONG TERM GOAL  #9   TITLE Pt will tolerate 20-30 minutes aerobic activity during therapy reporting moderate intensity on BORG RPE   Status Achieved     PT LONG TERM GOAL  #10   TITLE Will improve safety with higher level gait activities as indicated by increase in FGA score of 28/30   Baseline 27/30; horizontal head turns and eyes closed still impaired   Status Partially Met               Plan - 03/24/17 1623    Clinical Impression Statement Pt has made good progress towards LTG and has met 7 of 10 LTG overall; pt partially met 3 goals and had improvements in SOT composite score of 9 points, improved FGA score to 27/30 and improved Neuro QOL: LE to 50.7.  Pt has made functional improvements and is now able to ambulate independently in community environment without an assistive device and is participating in [place] classes.  Pt to continue with HEP.  Pt is safe for D/C from PT.   PT Treatment/Interventions ADLs/Self Care Home Management;Therapeutic activities;Therapeutic exercise;Balance training;Neuromuscular re-education;Vestibular;Patient/family education;Functional mobility training;Gait training;Stair training;DME Instruction   PT Next Visit Plan D/C today!   Consulted and Agree with Plan of Care Patient      Patient will benefit from skilled therapeutic intervention in order to improve the following deficits and impairments:  Abnormal gait, Decreased balance, Difficulty walking, Dizziness, Postural dysfunction, Decreased mobility, Impaired flexibility, Decreased endurance  Visit Diagnosis: Other abnormalities of gait and mobility  Dizziness and giddiness  Unsteadiness on feet       G-Codes - 04-06-17 1631    Functional Assessment Tool Used (Outpatient Only) BERG: 52/56, FGA: 27/30   Functional Limitation Mobility: Walking and moving  around   Mobility: Walking and Moving Around Goal Status [phone]) At least 1 percent but less than 20 percent impaired, limited or restricted   Mobility: Walking and Moving Around Discharge Status [phone]) At least 1 percent but less than 20 percent impaired, limited or restricted      Problem List Patient Active Problem List   Diagnosis Date Noted  . Stroke ([place]) 11/30/2016  . Essential hypertension 01/16/2015  . S/p cadaver renal transplant 11/06/2012  . Chronic kidney disease (CKD), stage IV (severe) ([place]) 03/18/2012  . BENIGN PROSTATIC HYPERTROPHY 04/18/2008  . EXTRINSIC ASTHMA, UNSPECIFIED 12/19/2007  . PSA, INCREASED 12/09/2007  . Hypothyroidism 08/12/2007  . Bipolar disorder ([place]) 07/27/2007  . Hyperlipidemia 07/07/2007  . Gout 07/07/2007  . HYPERTENSION 07/07/2007  . GERD 07/07/2007   PHYSICAL THERAPY DISCHARGE SUMMARY  Visits from Start of Care: 16  Current functional level related to goals / functional outcomes: See impression statement above   Remaining deficits: Vestibular and dynamic standing balance/gait impairments especially when vision and sensation are compromised.   Education / Equipment: HEP  Plan: Patient agrees to discharge.  Patient goals were partially met. Patient is being discharged due to being pleased with the current functional level.  ?????    Raylene Everts, PT, DPT 2017/04/06    4:35 PM    [facility] [address] [place], [place], [address] Phone: [phone]   Fax:  [phone]  Name: Curtis Jimenez MRN: [id number] Date of Birth: 03/13/1936

## 2017-05-31 ENCOUNTER — Encounter: Payer: Self-pay | Admitting: Family Medicine

## 2017-05-31 ENCOUNTER — Ambulatory Visit (INDEPENDENT_AMBULATORY_CARE_PROVIDER_SITE_OTHER): Payer: Medicare HMO | Admitting: Family Medicine

## 2017-05-31 VITALS — BP 90/54 | HR 73 | Temp 98.0°F | Resp 16 | Ht 69.5 in | Wt 197.5 lb

## 2017-05-31 DIAGNOSIS — E7800 Pure hypercholesterolemia, unspecified: Secondary | ICD-10-CM | POA: Diagnosis not present

## 2017-05-31 DIAGNOSIS — I10 Essential (primary) hypertension: Secondary | ICD-10-CM | POA: Diagnosis not present

## 2017-05-31 DIAGNOSIS — I959 Hypotension, unspecified: Secondary | ICD-10-CM

## 2017-05-31 DIAGNOSIS — Z23 Encounter for immunization: Secondary | ICD-10-CM | POA: Diagnosis not present

## 2017-05-31 DIAGNOSIS — R7301 Impaired fasting glucose: Secondary | ICD-10-CM | POA: Diagnosis not present

## 2017-05-31 DIAGNOSIS — E8881 Metabolic syndrome: Secondary | ICD-10-CM | POA: Diagnosis not present

## 2017-05-31 DIAGNOSIS — N183 Chronic kidney disease, stage 3 unspecified: Secondary | ICD-10-CM

## 2017-05-31 NOTE — Progress Notes (Signed)
OFFICE VISIT  05/31/2017   CC:  Chief Complaint  Patient presents with  . Follow-up    RCI, pt is not fasting.    HPI:    Patient is a 81 y.o. Caucasian male who presents for 4 mo f/u HTN, Hyperlipidemia, CRI stage III, hx of CVA, and insulin resistance.  HOme bp's >140/70s so he restarted 5mg  amlodipine qd that he had been on in the past .  No signif change in home bp readings after starting 5mg  amlodipine.  BP here today 92/56.  He is suspicious that his home bp machine is faulty.  HLD: compliant with simva, no side effects.  Goes to [place] q4-6 mo, returns later this month.  They recently started weening down his PPI b/c of fear of potential long term effects. Last saw Dr. Mercy Moore, nephrol, 03/17/17: note reviewed but labs done at that visit were never sent here.  Hx of CVA: finished rehab, has some residual vertigo with head turning, intermittent, says he can live with it. Also, ever since CVA he's been drooling a bit more.    Past Medical History:  Diagnosis Date  . Anemia   . Arthritis   . Basal cell carcinoma    neck (skin MD 2X per year)  . Bipolar disorder ([place])   . BPH (benign prostatic hyperplasia)    with elevated PSA; followed by Dr. Rosana Hoes at [facility]  . Cataract   . Chronic renal insufficiency, stage III (moderate)    Last f/u with neph 07/06/16: Cr stable at 1.57 (GFR 42)  . CVA (cerebral vascular accident) ([place]) 11/2016   Pontine (imaging neg), TPA given.  Pt discharged on plavix.  Carotid dopplers ok, echocardiogram ok.  Residual deficit: vertigo but this improved greatly with therapy.  . Gallbladder polyp 2015   Asymptomatic  . GERD (gastroesophageal reflux disease)   . Gout   . Hyperlipidemia   . Hypertension   . Hypothyroidism   . Restless legs syndrome   . S/p cadaver renal transplant 2013   Secondary to HTN +lithium toxicity over 30 yrs caused kidney destruction [facility] transplant MDs)    Past Surgical History:  Procedure Laterality Date  .  AV FISTULA PLACEMENT  04/08/2012   Procedure: ARTERIOVENOUS (AV) FISTULA CREATION;  Surgeon: Angelia Mould, MD;  Location: [facility] OR;  Service: Vascular;  Laterality: Left;  Creation of left brachial cephalic arteriovenous fistula  . COLONOSCOPY  2013   Normal except diverticulosis (recall 10 yrs)  . dilation for GERD    . KIDNEY TRANSPLANT  11/06/12   [place] (cadaveric)  . PROSTATE BIOPSY  2011   no malignancy  . TRANSTHORACIC ECHOCARDIOGRAM  11/2016   Normal LV systolic fxn, EF 05-39%.  Grade I DD.  Mild aortic root dilatation, mild MV regurg.    Outpatient Medications Prior to Visit  Medication Sig Dispense Refill  . acetaminophen (TYLENOL) 325 MG tablet Take 650 mg by mouth every 6 (six) hours as needed for mild pain.    Marland Kitchen allopurinol (ZYLOPRIM) 300 MG tablet Take 1 tablet (300 mg total) by mouth daily. 90 tablet 3  . carvedilol (COREG) 6.25 MG tablet Take 1 tablet (6.25 mg total) by mouth 2 (two) times daily with a meal. (Patient taking differently: Taking 2 tablets in the morning and 1 tablet in the evening) 180 tablet 3  . Cholecalciferol (VITAMIN D) 2000 UNITS CAPS Take 1 capsule by mouth daily.    . clopidogrel (PLAVIX) 75 MG tablet Take 1 tablet (75  mg total) by mouth daily. 90 tablet 3  . colchicine 0.6 MG tablet 2 tabs at onset of gout flare, then 1 tab one hour later, then starting the next day take 1 tab bid until gout flare is resolved 20 tablet 1  . hydrOXYzine (ATARAX/VISTARIL) 25 MG tablet Take 10 mg by mouth 2 (two) times daily.     Marland Kitchen levothyroxine (SYNTHROID, LEVOTHROID) 25 MCG tablet TAKE ONE TABLET BY MOUTH ONCE DAILY 90 tablet 3  . mycophenolate (MYFORTIC) 180 MG EC tablet Take 360 mg by mouth 2 (two) times daily.     . Omega-3 1000 MG CAPS Take 2,000 mg by mouth 2 (two) times daily.     Marland Kitchen omeprazole (PRILOSEC) 20 MG capsule Take 1 capsule (20 mg total) by mouth daily. 90 capsule 3  . simvastatin (ZOCOR) 20 MG tablet Take 20 mg by mouth daily.     Marland Kitchen  sulfamethoxazole-trimethoprim (BACTRIM,SEPTRA) 400-80 MG tablet Take 1 tablet by mouth every Monday, Wednesday, and Friday.    . tacrolimus (PROGRAF) 0.5 MG capsule Take 0.5 mg by mouth at bedtime. Also taking 1mg  in the morning    . tacrolimus (PROGRAF) 1 MG capsule Take 1 mg by mouth every morning. Also taking 0.5mg  at bedtime    . vitamin B-12 (CYANOCOBALAMIN) 100 MCG tablet Take 100 mcg by mouth daily.    Marland Kitchen amoxicillin (AMOXIL) 500 MG capsule     . baclofen (LIORESAL) 10 MG tablet Take one-quarter of a tablet (0.25 mg) three times daily as needed for back pain (Patient not taking: Reported on 05/31/2017) 16 each 0   No facility-administered medications prior to visit.     Allergies  Allergen Reactions  . Amantadine Hcl     REACTION: itching  . Chlorpromazine Hcl Other (See Comments)    REACTION: "fell flat on face"    ROS As per HPI  PE: Blood pressure (!) 90/54, pulse 73, temperature 98 F (36.7 C), temperature source Oral, resp. rate 16, height 5' 9.5" (1.765 m), weight 197 lb 8 oz (89.6 kg), SpO2 92 %. Gen: Alert, well appearing.  Patient is oriented to person, place, time, and situation. AFFECT: pleasant, lucid thought and speech. CV: RRR, no m/r/g.   LUNGS: CTA bilat, nonlabored resps, good aeration in all lung fields. EXT: no clubbing or cyanosis.  Trace bilat LE edema.  LABS:  Lab Results  Component Value Date   TSH 1.07 01/28/2017   Lab Results  Component Value Date   WBC 5.8 12/05/2016   HGB 12.1 (L) 12/05/2016   HCT 37.8 (L) 12/05/2016   MCV 86.3 12/05/2016   PLT 190 12/05/2016   Lab Results  Component Value Date   CREATININE 1.60 (H) 12/05/2016   BUN 22 (H) 12/05/2016   NA 140 12/05/2016   K 3.9 12/05/2016   CL 108 12/05/2016   CO2 26 12/05/2016   Lab Results  Component Value Date   ALT 20 11/30/2016   AST 24 11/30/2016   ALKPHOS 53 11/30/2016   BILITOT 0.9 11/30/2016   Lab Results  Component Value Date   CHOL 120 12/01/2016   Lab Results   Component Value Date   HDL 42 12/01/2016   Lab Results  Component Value Date   LDLCALC 65 12/01/2016   Lab Results  Component Value Date   TRIG 63 12/01/2016   Lab Results  Component Value Date   CHOLHDL 2.9 12/01/2016   Lab Results  Component Value Date   PSA 3.51 03/28/2008  PSA 4.83 (H) 12/02/2007   Lab Results  Component Value Date   HGBA1C 5.9 (H) 12/01/2016   IMPRESSION AND PLAN:  1) HTN: bp low here on 2 separate checks 15-20 min apart. ? Home bp machine faulty? Instructions: Stop amlodipine 5mg  qd. Buy new home bp cuff and check BP daily for 2 weeks.  2) HLD: tolerating statin.  Lipids excellent 7 mo ago. Pt not fasting today.  Will repeat at next f/u visit.  3) CRI stage III, hx of renal transplant: check BMET today. Obtain labs done at 02/2017 nephrol f/u.  4) Insulin resistance: recheck HbA1c today.  5) pt requests Hep A vaccine #2.  He got #1 08/14/16, so I did given him #2 today.  An After Visit Summary was printed and given to the patient.  FOLLOW UP: Return in about 2 weeks (around 06/14/2017) for f/u HTN.  Signed:  Crissie Sickles, MD           05/31/2017

## 2017-05-31 NOTE — Patient Instructions (Addendum)
Stop amlodipine 5mg  qd. Buy new home bp cuff and check BP daily for 2 weeks.

## 2017-06-01 ENCOUNTER — Encounter: Payer: Self-pay | Admitting: *Deleted

## 2017-06-01 LAB — BASIC METABOLIC PANEL
BUN: 23 mg/dL (ref 7–25)
CALCIUM: 9.2 mg/dL (ref 8.6–10.3)
CHLORIDE: 108 mmol/L (ref 98–110)
CO2: 23 mmol/L (ref 20–31)
CREATININE: 1.45 mg/dL — AB (ref 0.70–1.18)
Glucose, Bld: 114 mg/dL — ABNORMAL HIGH (ref 65–99)
Potassium: 4.3 mmol/L (ref 3.5–5.3)
Sodium: 142 mmol/L (ref 135–146)

## 2017-06-01 LAB — HEMOGLOBIN A1C
HEMOGLOBIN A1C: 5.7 % — AB (ref <5.7)
MEAN PLASMA GLUCOSE: 117 mg/dL

## 2017-06-08 ENCOUNTER — Encounter: Payer: Self-pay | Admitting: Family Medicine

## 2017-06-08 DIAGNOSIS — R7301 Impaired fasting glucose: Secondary | ICD-10-CM | POA: Insufficient documentation

## 2017-06-14 ENCOUNTER — Ambulatory Visit (INDEPENDENT_AMBULATORY_CARE_PROVIDER_SITE_OTHER): Payer: Medicare HMO | Admitting: Family Medicine

## 2017-06-14 ENCOUNTER — Encounter: Payer: Self-pay | Admitting: Family Medicine

## 2017-06-14 VITALS — BP 110/64 | HR 69 | Temp 97.8°F | Resp 16 | Ht 69.5 in | Wt 201.0 lb

## 2017-06-14 DIAGNOSIS — I10 Essential (primary) hypertension: Secondary | ICD-10-CM

## 2017-06-14 NOTE — Progress Notes (Signed)
OFFICE VISIT  06/14/2017   CC:  Chief Complaint  Patient presents with  . Follow-up    RCI, pt is not fasting.    HPI:    Patient is a 81 y.o. Caucasian male who presents for 2 week f/u HTN. Last visit BP was low, but pt's home readings were consistently > 616W systolic. He was going to stop his amlodipine and get a new bp cuff and monitor BP at home and we were to review these numbers today.  Feels good, without dizziness or generalized weakness. He took his coreg 1 hours ago and says "my bp always goes down like this 1 hour after taking my bp med". He is not taking amlodipine. Avg bp at home the last 2 wks: avg 137/75, HR avg 70s.  Past Medical History:  Diagnosis Date  . Anemia   . Arthritis   . Basal cell carcinoma    neck (skin MD 2X per year)  . Bipolar disorder ([place])   . BPH (benign prostatic hyperplasia)    with elevated PSA; followed by Dr. Rosana Hoes at [facility]  . Cataract   . Chronic renal insufficiency, stage III (moderate)    Last f/u with neph 07/06/16: Cr stable at 1.57 (GFR 42)  . CVA (cerebral vascular accident) ([place]) 11/2016   Pontine (imaging neg), TPA given.  Pt discharged on plavix.  Carotid dopplers ok, echocardiogram ok.  Residual deficit: vertigo but this improved greatly with therapy.  . Gallbladder polyp 2015   Asymptomatic  . GERD (gastroesophageal reflux disease)   . Gout   . Hyperlipidemia   . Hypertension   . Hypothyroidism   . Restless legs syndrome   . S/p cadaver renal transplant 2013   Secondary to HTN +lithium toxicity over 30 yrs caused kidney destruction [facility] transplant MDs)    Past Surgical History:  Procedure Laterality Date  . AV FISTULA PLACEMENT  04/08/2012   Procedure: ARTERIOVENOUS (AV) FISTULA CREATION;  Surgeon: Angelia Mould, MD;  Location: [facility] OR;  Service: Vascular;  Laterality: Left;  Creation of left brachial cephalic arteriovenous fistula  . COLONOSCOPY  2013   Normal except diverticulosis (recall 10  yrs)  . dilation for GERD    . KIDNEY TRANSPLANT  11/06/12   [place] (cadaveric)  . PROSTATE BIOPSY  2011   no malignancy  . TRANSTHORACIC ECHOCARDIOGRAM  11/2016   Normal LV systolic fxn, EF 73-71%.  Grade I DD.  Mild aortic root dilatation, mild MV regurg.    Outpatient Medications Prior to Visit  Medication Sig Dispense Refill  . acetaminophen (TYLENOL) 325 MG tablet Take 650 mg by mouth every 6 (six) hours as needed for mild pain.    Marland Kitchen allopurinol (ZYLOPRIM) 300 MG tablet Take 1 tablet (300 mg total) by mouth daily. 90 tablet 3  . amoxicillin (AMOXIL) 500 MG capsule     . carvedilol (COREG) 6.25 MG tablet Take 1 tablet (6.25 mg total) by mouth 2 (two) times daily with a meal. (Patient taking differently: Taking 2 tablets in the morning and 1 tablet in the evening) 180 tablet 3  . Cholecalciferol (VITAMIN D) 2000 UNITS CAPS Take 1 capsule by mouth daily.    . clopidogrel (PLAVIX) 75 MG tablet Take 1 tablet (75 mg total) by mouth daily. 90 tablet 3  . colchicine 0.6 MG tablet 2 tabs at onset of gout flare, then 1 tab one hour later, then starting the next day take 1 tab bid until gout flare is resolved 20  tablet 1  . hydrOXYzine (ATARAX/VISTARIL) 25 MG tablet Take 10 mg by mouth 2 (two) times daily.     Marland Kitchen levothyroxine (SYNTHROID, LEVOTHROID) 25 MCG tablet TAKE ONE TABLET BY MOUTH ONCE DAILY 90 tablet 3  . mycophenolate (MYFORTIC) 180 MG EC tablet Take 360 mg by mouth 2 (two) times daily.     . Omega-3 1000 MG CAPS Take 2,000 mg by mouth 2 (two) times daily.     Marland Kitchen omeprazole (PRILOSEC) 20 MG capsule Take 1 capsule (20 mg total) by mouth daily. 90 capsule 3  . simvastatin (ZOCOR) 20 MG tablet Take 20 mg by mouth daily.     Marland Kitchen sulfamethoxazole-trimethoprim (BACTRIM,SEPTRA) 400-80 MG tablet Take 1 tablet by mouth every Monday, Wednesday, and Friday.    . tacrolimus (PROGRAF) 0.5 MG capsule Take 0.5 mg by mouth at bedtime. Also taking 1mg  in the morning    . tacrolimus (PROGRAF) 1 MG capsule  Take 1 mg by mouth every morning. Also taking 0.5mg  at bedtime    . vitamin B-12 (CYANOCOBALAMIN) 100 MCG tablet Take 100 mcg by mouth daily.    Marland Kitchen amLODipine (NORVASC) 5 MG tablet Take 5 mg by mouth daily.     No facility-administered medications prior to visit.     Allergies  Allergen Reactions  . Amantadine Hcl     REACTION: itching  . Chlorpromazine Hcl Other (See Comments)    REACTION: "fell flat on face"    ROS As per HPI  PE: pt's bp cuff here today: 118/71 Blood pressure 110/64, pulse 69, temperature 97.8 F (36.6 C), temperature source Oral, resp. rate 16, height 5' 9.5" (1.765 m), weight 201 lb (91.2 kg), SpO2 94 %.  Repeat bp here manually is 110/64 (initial bp by automated bp machine was 90/56) Gen: Alert, well appearing.  Patient is oriented to person, place, time, and situation. AFFECT: pleasant, lucid thought and speech. CV: RRR, no m/r/g.   LUNGS: CTA bilat, nonlabored resps, good aeration in all lung fields.   LABS:    Chemistry      Component Value Date/Time   NA 142 05/31/2017 1050   NA 141 12/14/2011   K 4.3 05/31/2017 1050   CL 108 05/31/2017 1050   CO2 23 05/31/2017 1050   BUN 23 05/31/2017 1050   BUN 35 (A) 12/14/2011   CREATININE 1.45 (H) 05/31/2017 1050   GLU 120 12/14/2011      Component Value Date/Time   CALCIUM 9.2 05/31/2017 1050   CALCIUM 9.5 12/06/2010 0046   ALKPHOS 53 11/30/2016 1334   AST 24 11/30/2016 1334   ALT 20 11/30/2016 1334   BILITOT 0.9 11/30/2016 1334     Lab Results  Component Value Date   HGBA1C 5.7 (H) 05/31/2017   IMPRESSION AND PLAN:  1) HTN: The current medical regimen is effective;  continue present plan and medications. Continue home monitoring.  An After Visit Summary was printed and given to the patient.  FOLLOW UP: Return in about 4 months (around 10/14/2017) for annual CPE (fasting); also needs AWV with Maudie Mercury after 07/23/17.  Signed:  Crissie Sickles, MD           06/14/2017

## 2017-07-19 DIAGNOSIS — Z940 Kidney transplant status: Secondary | ICD-10-CM | POA: Diagnosis not present

## 2017-07-21 ENCOUNTER — Encounter: Payer: Self-pay | Admitting: Neurology

## 2017-07-21 ENCOUNTER — Ambulatory Visit (INDEPENDENT_AMBULATORY_CARE_PROVIDER_SITE_OTHER): Payer: Medicare HMO | Admitting: Neurology

## 2017-07-21 VITALS — BP 110/60 | HR 73 | Ht 69.5 in | Wt 200.2 lb

## 2017-07-21 DIAGNOSIS — I10 Essential (primary) hypertension: Secondary | ICD-10-CM

## 2017-07-21 DIAGNOSIS — M109 Gout, unspecified: Secondary | ICD-10-CM | POA: Diagnosis not present

## 2017-07-21 DIAGNOSIS — E669 Obesity, unspecified: Secondary | ICD-10-CM | POA: Diagnosis not present

## 2017-07-21 DIAGNOSIS — Z940 Kidney transplant status: Secondary | ICD-10-CM | POA: Diagnosis not present

## 2017-07-21 DIAGNOSIS — K824 Cholesterolosis of gallbladder: Secondary | ICD-10-CM | POA: Diagnosis not present

## 2017-07-21 DIAGNOSIS — R739 Hyperglycemia, unspecified: Secondary | ICD-10-CM | POA: Diagnosis not present

## 2017-07-21 DIAGNOSIS — G450 Vertebro-basilar artery syndrome: Secondary | ICD-10-CM | POA: Diagnosis not present

## 2017-07-21 DIAGNOSIS — E785 Hyperlipidemia, unspecified: Secondary | ICD-10-CM | POA: Diagnosis not present

## 2017-07-21 DIAGNOSIS — I129 Hypertensive chronic kidney disease with stage 1 through stage 4 chronic kidney disease, or unspecified chronic kidney disease: Secondary | ICD-10-CM | POA: Diagnosis not present

## 2017-07-21 NOTE — Progress Notes (Signed)
NEUROLOGY FOLLOW UP OFFICE NOTE  JAESHAWN Jimenez [id number]  HISTORY OF PRESENT ILLNESS: Curtis Jimenez is a 81 year old right-handed male with hyperlipidemia, hypertension, hypothyroidism, chronic renal failure stage III status post renal trasnplant, Bipolar disorder and history of basal cell carcinoma on the neck who follows up for stroke.  He is accompanied by his wife who supplements history.  UPDATE:  Vertigo resolved after PT in March.  He is feeling well.  He no longer requires assistance for ambulation.  He still participates in tai chi.  He still has not restarted driving. Hgb A1c from 05/31/17 was 5.7.  HISTORY: He was admitted to [facility] from 11/30/16 to 12/05/16 after developing sudden onset vertigo, blurred/double vision, nausea and vomiting upon standing up.  On exam, he was found to have nystagmus in the left eye, left arm dysmetria and mild right facial weakness.  He received IV tPA and was admitted to the ICU.  MRI of the brain was personally reviewed and did not reveal an acute stroke.  MRA of head showed no large or proximal vessel stenosis or occlusion.  Carotid doppler showed no hemodynamically significant stenosis.  2D echo showed EF 55-60% with no cardiac source of embolus.  LDL was 65.  Hgb A1c was 5.9.  DWI negative stroke involving the brainstem was still suspected.  Prior to admission, he was on ASA 71m daily.  This was switched to Plavix 773mdaily.  He was advised to continue simvastatin 1055maily.   About 6 weeks prior to the stroke, his wife says he had a dizzy spell while at church.  He was found to have low blood pressure and his blood pressure medication was being adjusted in the weeks leading up to the stroke.  PAST MEDICAL HISTORY: Past Medical History:  Diagnosis Date  . Anemia   . Arthritis   . Basal cell carcinoma    neck (skin MD 2X per year)  . [place] BPH (benign prostatic hyperplasia)    with elevated PSA;  [facility]  . Cataract   . Chronic renal insufficiency, stage III (moderate)    Last f/u with neph 07/06/16: Cr stable at 1.57 (GFR 42)  . [place]   [place]ine (imaging neg), TPA given.  Pt discharged on plavix.  Carotid dopplers ok, echocardiogram ok.  Residual deficit: vertigo but this improved greatly with therapy.  . Gallbladder polyp 2015   Asymptomatic  . GERD (gastroesophageal reflux disease)   . Gout   . Hyperlipidemia   . Hypertension   . Hypothyroidism   . Restless legs syndrome   . S/p cadaver renal transplant 2013   Secondary to HTN +lithium toxicity over 30 yrs [facility]    MEDICATIONS: Current Outpatient Prescriptions on File Prior to Visit  Medication Sig Dispense Refill  . acetaminophen (TYLENOL) 325 MG tablet Take 650 mg by mouth every 6 (six) hours as needed for mild pain.    . aMarland Kitchenlopurinol (ZYLOPRIM) 300 MG tablet Take 1 tablet (300 mg total) by mouth daily. 90 tablet 3  . amoxicillin (AMOXIL) 500 MG capsule     . carvedilol (COREG) 6.25 MG tablet Take 1 tablet (6.25 mg total) by mouth 2 (two) times daily with a meal. (Patient taking differently: Taking 2 tablets in the morning and 1 tablet in the evening) 180 tablet 3  . Cholecalciferol (VITAMIN D) 2000 UNITS CAPS  Take 1 capsule by mouth daily.    . clopidogrel (PLAVIX) 75 MG tablet Take 1 tablet (75 mg total) by mouth daily. 90 tablet 3  . colchicine 0.6 MG tablet 2 tabs at onset of gout flare, then 1 tab one hour later, then starting the next day take 1 tab bid until gout flare is resolved 20 tablet 1  . hydrOXYzine (ATARAX/VISTARIL) 25 MG tablet Take 10 mg by mouth 2 (two) times daily.     Marland Kitchen levothyroxine (SYNTHROID, LEVOTHROID) 25 MCG tablet TAKE ONE TABLET BY MOUTH ONCE DAILY 90 tablet 3  . mycophenolate (MYFORTIC) 180 MG EC tablet Take 360 mg by mouth 2 (two) times daily.     . Omega-3 1000 MG CAPS Take 2,000  mg by mouth 2 (two) times daily.     Marland Kitchen omeprazole (PRILOSEC) 20 MG capsule Take 1 capsule (20 mg total) by mouth daily. 90 capsule 3  . risperiDONE (RISPERDAL) 2 MG tablet Take 2 mg by mouth at bedtime.    . simvastatin (ZOCOR) 20 MG tablet Take 20 mg by mouth daily.     Marland Kitchen sulfamethoxazole-trimethoprim (BACTRIM,SEPTRA) 400-80 MG tablet Take 1 tablet by mouth every Monday, Wednesday, and Friday.    . tacrolimus (PROGRAF) 0.5 MG capsule Take 0.5 mg by mouth at bedtime. Also taking 45m in the morning    . tacrolimus (PROGRAF) 1 MG capsule Take 1 mg by mouth every morning. Also taking 0.570mat bedtime    . vitamin B-12 (CYANOCOBALAMIN) 100 MCG tablet Take 100 mcg by mouth daily.     No current facility-administered medications on file prior to visit.     ALLERGIES: Allergies  Allergen Reactions  . Amantadine Hcl     REACTION: itching  . Chlorpromazine Hcl Other (See Comments)    REACTION: "fell flat on face"    FAMILY HISTORY: Family History  Problem Relation Age of Onset  . Cancer Father        BLADDER  . Heart disease Father     SOCIAL HISTORY: Social History   Social History  . Marital status: Married    Spouse name: N/A  . Number of children: N/A  . Years of education: N/A   Occupational History  . Not on file.   Social History Main Topics  . Smoking status: Former Smoker    Years: 16.00    Types: Pipe, Cigars    Quit date: 05/11/1974  . Smokeless tobacco: Never Used  . Alcohol use No  . Drug use: No  . Sexual activity: Not on file   Other Topics Concern  . 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   Alcohol: none.   Exercise: minimal, but is going to [facility]   REVIEW OF SYSTEMS: Constitutional: No fevers, chills, or sweats, no generalized fatigue, change in  appetite Eyes: No visual changes, double vision, eye pain Ear, nose and throat: No hearing loss, ear pain, nasal congestion, sore throat Cardiovascular: No chest pain, palpitations Respiratory:  No shortness of breath at rest or with exertion, wheezes GastrointestinaI: No nausea, vomiting, diarrhea, abdominal pain, fecal incontinence Genitourinary:  No dysuria, urinary retention or frequency Musculoskeletal:  No neck pain, back pain Integumentary: No rash, pruritus, skin lesions Neurological: as above Psychiatric: No depression, insomnia,  anxiety Endocrine: No palpitations, fatigue, diaphoresis, mood swings, change in appetite, change in weight, increased thirst Hematologic/Lymphatic:  No purpura, petechiae. Allergic/Immunologic: no itchy/runny eyes, nasal congestion, recent allergic reactions, rashes  PHYSICAL EXAM: Vitals:   07/21/17 1414  BP: 110/60  Pulse: 73   General: No acute distress.  Patient appears well-groomed.   Head:  Normocephalic/atraumatic Eyes:  Fundi examined but not visualized Neck: supple, no paraspinal tenderness, full range of motion Heart:  Regular rate and rhythm Lungs:  Clear to auscultation bilaterally Back: No paraspinal tenderness Neurological Exam: alert and oriented to person, place, and time. Attention span and concentration intact, recent and remote memory intact, fund of knowledge intact.  Speech fluent and not dysarthric, language intact.  CN II-XII intact. Bulk and tone normal, muscle strength 5/5 throughout.  Sensation to light touch, temperature and vibration intact.  Deep tendon reflexes 2+ throughout, toes downgoing.  Finger to nose and heel to shin testing intact.  Gait normal, Romberg negative.  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 Hypertension Hyperlipidemia   PLAN: Continue Plavix 66m daily for secondary stroke prevention Continue simvastatin (LDL at goal less than 70) Continue blood pressure control Continue  exercise and diet He may resume driving.  Advised to start with a parking lot/around the block and that he have somebody with him until he is used to it again. Follow up as needed.  18 minutes spent face to face with patient, over 50% spent discussing management.  AMetta Clines DO  CC:  PShawnie Dapper MD

## 2017-07-21 NOTE — Patient Instructions (Signed)
You are doing well. 1.  Continue the plavix daily 2.  Continue the simvastatin and blood pressure medication. 3.  You may resume driving 4.  Continue exercise/tai chi 5.  Follow up as needed.

## 2017-08-03 DIAGNOSIS — R69 Illness, unspecified: Secondary | ICD-10-CM | POA: Diagnosis not present

## 2017-08-18 ENCOUNTER — Other Ambulatory Visit: Payer: Self-pay | Admitting: Family Medicine

## 2017-08-19 NOTE — Telephone Encounter (Signed)
SW with pt and he stated that he takes one 12.5mg  tablet in the morning and one 6.25mg  tablet in the evening. Please advise. Thanks.

## 2017-08-20 ENCOUNTER — Encounter: Payer: Self-pay | Admitting: Family Medicine

## 2017-09-22 DIAGNOSIS — R69 Illness, unspecified: Secondary | ICD-10-CM | POA: Diagnosis not present

## 2017-09-27 DIAGNOSIS — K625 Hemorrhage of anus and rectum: Secondary | ICD-10-CM

## 2017-09-27 HISTORY — DX: Hemorrhage of anus and rectum: K62.5

## 2017-10-07 ENCOUNTER — Observation Stay (HOSPITAL_COMMUNITY)
Admission: EM | Admit: 2017-10-07 | Discharge: 2017-10-09 | Disposition: A | Discharge status: Home / Self Care | Payer: Medicare HMO | Attending: Family Medicine | Admitting: Family Medicine

## 2017-10-07 ENCOUNTER — Observation Stay (HOSPITAL_COMMUNITY): Payer: Medicare HMO

## 2017-10-07 ENCOUNTER — Encounter (HOSPITAL_COMMUNITY): Payer: Self-pay

## 2017-10-07 ENCOUNTER — Telehealth: Payer: Self-pay | Admitting: Family Medicine

## 2017-10-07 DIAGNOSIS — G2581 Restless legs syndrome: Secondary | ICD-10-CM | POA: Insufficient documentation

## 2017-10-07 DIAGNOSIS — Z940 Kidney transplant status: Secondary | ICD-10-CM | POA: Insufficient documentation

## 2017-10-07 DIAGNOSIS — F319 Bipolar disorder, unspecified: Secondary | ICD-10-CM | POA: Diagnosis present

## 2017-10-07 DIAGNOSIS — N184 Chronic kidney disease, stage 4 (severe): Secondary | ICD-10-CM | POA: Diagnosis not present

## 2017-10-07 DIAGNOSIS — N400 Enlarged prostate without lower urinary tract symptoms: Secondary | ICD-10-CM | POA: Diagnosis not present

## 2017-10-07 DIAGNOSIS — D62 Acute posthemorrhagic anemia: Secondary | ICD-10-CM | POA: Diagnosis not present

## 2017-10-07 DIAGNOSIS — Z791 Long term (current) use of non-steroidal anti-inflammatories (NSAID): Secondary | ICD-10-CM | POA: Diagnosis not present

## 2017-10-07 DIAGNOSIS — Z79899 Other long term (current) drug therapy: Secondary | ICD-10-CM | POA: Diagnosis not present

## 2017-10-07 DIAGNOSIS — K921 Melena: Principal | ICD-10-CM | POA: Insufficient documentation

## 2017-10-07 DIAGNOSIS — I714 Abdominal aortic aneurysm, without rupture, unspecified: Secondary | ICD-10-CM

## 2017-10-07 DIAGNOSIS — Z7901 Long term (current) use of anticoagulants: Secondary | ICD-10-CM

## 2017-10-07 DIAGNOSIS — K219 Gastro-esophageal reflux disease without esophagitis: Secondary | ICD-10-CM | POA: Diagnosis not present

## 2017-10-07 DIAGNOSIS — R69 Illness, unspecified: Secondary | ICD-10-CM | POA: Diagnosis not present

## 2017-10-07 DIAGNOSIS — E785 Hyperlipidemia, unspecified: Secondary | ICD-10-CM | POA: Insufficient documentation

## 2017-10-07 DIAGNOSIS — Z85828 Personal history of other malignant neoplasm of skin: Secondary | ICD-10-CM | POA: Insufficient documentation

## 2017-10-07 DIAGNOSIS — Z8673 Personal history of transient ischemic attack (TIA), and cerebral infarction without residual deficits: Secondary | ICD-10-CM | POA: Diagnosis not present

## 2017-10-07 DIAGNOSIS — I129 Hypertensive chronic kidney disease with stage 1 through stage 4 chronic kidney disease, or unspecified chronic kidney disease: Secondary | ICD-10-CM | POA: Diagnosis not present

## 2017-10-07 DIAGNOSIS — Z888 Allergy status to other drugs, medicaments and biological substances status: Secondary | ICD-10-CM | POA: Diagnosis not present

## 2017-10-07 DIAGNOSIS — K922 Gastrointestinal hemorrhage, unspecified: Secondary | ICD-10-CM

## 2017-10-07 DIAGNOSIS — Z87891 Personal history of nicotine dependence: Secondary | ICD-10-CM | POA: Diagnosis not present

## 2017-10-07 DIAGNOSIS — M1990 Unspecified osteoarthritis, unspecified site: Secondary | ICD-10-CM | POA: Insufficient documentation

## 2017-10-07 DIAGNOSIS — D638 Anemia in other chronic diseases classified elsewhere: Secondary | ICD-10-CM | POA: Insufficient documentation

## 2017-10-07 DIAGNOSIS — Z7902 Long term (current) use of antithrombotics/antiplatelets: Secondary | ICD-10-CM | POA: Insufficient documentation

## 2017-10-07 DIAGNOSIS — E039 Hypothyroidism, unspecified: Secondary | ICD-10-CM | POA: Insufficient documentation

## 2017-10-07 DIAGNOSIS — R7301 Impaired fasting glucose: Secondary | ICD-10-CM | POA: Diagnosis not present

## 2017-10-07 DIAGNOSIS — M109 Gout, unspecified: Secondary | ICD-10-CM | POA: Diagnosis present

## 2017-10-07 LAB — CBC
HCT: 29.3 % — ABNORMAL LOW (ref 39.0–52.0)
HEMATOCRIT: 36.2 % — AB (ref 39.0–52.0)
HEMOGLOBIN: 11.3 g/dL — AB (ref 13.0–17.0)
Hemoglobin: 9.1 g/dL — ABNORMAL LOW (ref 13.0–17.0)
MCH: 27.4 pg (ref 26.0–34.0)
MCH: 27.2 pg (ref 26.0–34.0)
MCHC: 31.2 g/dL (ref 30.0–36.0)
MCHC: 31.1 g/dL (ref 30.0–36.0)
MCV: 87.9 fL (ref 78.0–100.0)
MCV: 87.7 fL (ref 78.0–100.0)
Platelets: 191 10*3/uL (ref 150–400)
Platelets: 161 10*3/uL (ref 150–400)
RBC: 4.12 MIL/uL — ABNORMAL LOW (ref 4.22–5.81)
RBC: 3.34 MIL/uL — ABNORMAL LOW (ref 4.22–5.81)
RDW: 13.9 % (ref 11.5–15.5)
RDW: 13.8 % (ref 11.5–15.5)
WBC: 7.9 10*3/uL (ref 4.0–10.5)
WBC: 6.8 10*3/uL (ref 4.0–10.5)

## 2017-10-07 LAB — DIFFERENTIAL
BASOS ABS: 0 10*3/uL (ref 0.0–0.1)
BASOS PCT: 0 %
EOS ABS: 0.1 10*3/uL (ref 0.0–0.7)
Eosinophils Relative: 1 %
Lymphocytes Relative: 13 %
Lymphs Abs: 1 10*3/uL (ref 0.7–4.0)
Monocytes Absolute: 0.4 10*3/uL (ref 0.1–1.0)
Monocytes Relative: 6 %
NEUTROS PCT: 80 %
Neutro Abs: 6.2 10*3/uL (ref 1.7–7.7)

## 2017-10-07 LAB — ABO/RH: ABO/RH(D): A NEG

## 2017-10-07 LAB — COMPREHENSIVE METABOLIC PANEL
ALBUMIN: 3.1 g/dL — AB (ref 3.5–5.0)
ALT: 13 U/L — ABNORMAL LOW (ref 17–63)
ANION GAP: 8 (ref 5–15)
AST: 17 U/L (ref 15–41)
Alkaline Phosphatase: 45 U/L (ref 38–126)
BILIRUBIN TOTAL: 0.8 mg/dL (ref 0.3–1.2)
BUN: 29 mg/dL — ABNORMAL HIGH (ref 6–20)
CO2: 22 mmol/L (ref 22–32)
Calcium: 9 mg/dL (ref 8.9–10.3)
Chloride: 109 mmol/L (ref 101–111)
Creatinine, Ser: 1.97 mg/dL — ABNORMAL HIGH (ref 0.61–1.24)
GFR calc Af Amer: 36 mL/min — ABNORMAL LOW (ref >60)
GFR, EST NON AFRICAN AMERICAN: 31 mL/min — AB (ref >60)
GLUCOSE: 162 mg/dL — AB (ref 65–99)
POTASSIUM: 5 mmol/L (ref 3.5–5.1)
Sodium: 139 mmol/L (ref 135–145)
TOTAL PROTEIN: 5.3 g/dL — AB (ref 6.5–8.1)

## 2017-10-07 LAB — POC OCCULT BLOOD, ED: FECAL OCCULT BLD: POSITIVE — AB

## 2017-10-07 LAB — TYPE AND SCREEN
ABO/RH(D): A NEG
ANTIBODY SCREEN: NEGATIVE

## 2017-10-07 LAB — PROTIME-INR
INR: 1.13
Prothrombin Time: 14.4 seconds (ref 11.4–15.2)

## 2017-10-07 LAB — TROPONIN I

## 2017-10-07 MED ORDER — VITAMIN D 1000 UNITS PO TABS
1000.0000 [IU] | ORAL_TABLET | Freq: Every day | ORAL | Status: DC
  Administered 2017-10-08 – 2017-10-09 (×2): 1000 [IU] via ORAL
  Filled 2017-10-07 (×2): qty 1

## 2017-10-07 MED ORDER — SODIUM CHLORIDE 0.9 % IV SOLN
INTRAVENOUS | Status: AC
  Administered 2017-10-07 – 2017-10-08 (×2): via INTRAVENOUS

## 2017-10-07 MED ORDER — SODIUM CHLORIDE 0.9 % IV BOLUS (SEPSIS)
1000.0000 mL | Freq: Once | INTRAVENOUS | Status: AC
  Administered 2017-10-07: 1000 mL via INTRAVENOUS

## 2017-10-07 MED ORDER — SULFAMETHOXAZOLE-TRIMETHOPRIM 400-80 MG PO TABS
1.0000 | ORAL_TABLET | ORAL | Status: DC
  Administered 2017-10-08: 1 via ORAL
  Filled 2017-10-07 (×2): qty 1

## 2017-10-07 MED ORDER — HYDROXYZINE HCL 10 MG PO TABS
10.0000 mg | ORAL_TABLET | Freq: Two times a day (BID) | ORAL | Status: DC
  Administered 2017-10-07 – 2017-10-09 (×4): 10 mg via ORAL
  Filled 2017-10-07 (×4): qty 1

## 2017-10-07 MED ORDER — SIMVASTATIN 20 MG PO TABS
20.0000 mg | ORAL_TABLET | Freq: Every day | ORAL | Status: DC
Start: 2017-10-08 — End: 2017-10-09
  Administered 2017-10-08 – 2017-10-09 (×2): 20 mg via ORAL
  Filled 2017-10-07 (×2): qty 1

## 2017-10-07 MED ORDER — LEVOTHYROXINE SODIUM 25 MCG PO TABS
25.0000 ug | ORAL_TABLET | Freq: Every day | ORAL | Status: DC
  Administered 2017-10-08 – 2017-10-09 (×2): 25 ug via ORAL
  Filled 2017-10-07 (×3): qty 1

## 2017-10-07 MED ORDER — OMEGA-3 1000 MG PO CAPS: 2000.0000 mg | ORAL_CAPSULE | Freq: Two times a day (BID) | ORAL | Status: DC

## 2017-10-07 MED ORDER — MYCOPHENOLATE SODIUM 180 MG PO TBEC
360.0000 mg | DELAYED_RELEASE_TABLET | Freq: Two times a day (BID) | ORAL | Status: DC
  Administered 2017-10-07 – 2017-10-09 (×4): 360 mg via ORAL
  Filled 2017-10-07 (×5): qty 2

## 2017-10-07 MED ORDER — ONDANSETRON HCL 4 MG PO TABS: 4.0000 mg | ORAL_TABLET | Freq: Four times a day (QID) | ORAL | Status: DC | PRN

## 2017-10-07 MED ORDER — ONDANSETRON HCL 4 MG/2ML IJ SOLN: 4.0000 mg | Freq: Four times a day (QID) | INTRAMUSCULAR | Status: DC | PRN

## 2017-10-07 MED ORDER — OMEGA-3-ACID ETHYL ESTERS 1 G PO CAPS
2.0000 g | ORAL_CAPSULE | Freq: Two times a day (BID) | ORAL | Status: DC
  Administered 2017-10-07 – 2017-10-09 (×4): 2 g via ORAL
  Filled 2017-10-07 (×4): qty 2

## 2017-10-07 MED ORDER — TACROLIMUS 1 MG PO CAPS
1.0000 mg | ORAL_CAPSULE | Freq: Every day | ORAL | Status: DC
  Administered 2017-10-08 – 2017-10-09 (×2): 1 mg via ORAL
  Filled 2017-10-07 (×2): qty 1

## 2017-10-07 MED ORDER — ACETAMINOPHEN 325 MG PO TABS: 650.0000 mg | ORAL_TABLET | Freq: Four times a day (QID) | ORAL | Status: DC | PRN

## 2017-10-07 MED ORDER — ALLOPURINOL 300 MG PO TABS
300.0000 mg | ORAL_TABLET | Freq: Every day | ORAL | Status: DC
  Administered 2017-10-08 – 2017-10-09 (×2): 300 mg via ORAL
  Filled 2017-10-07 (×2): qty 1

## 2017-10-07 MED ORDER — RISPERIDONE 2 MG PO TABS
2.0000 mg | ORAL_TABLET | Freq: Every day | ORAL | Status: DC
  Administered 2017-10-07 – 2017-10-08 (×2): 2 mg via ORAL
  Filled 2017-10-07 (×2): qty 1

## 2017-10-07 MED ORDER — HYDROCODONE-ACETAMINOPHEN 5-325 MG PO TABS: 1.0000 | ORAL_TABLET | ORAL | Status: DC | PRN

## 2017-10-07 MED ORDER — TACROLIMUS 0.5 MG PO CAPS
0.5000 mg | ORAL_CAPSULE | Freq: Every day | ORAL | Status: DC
  Administered 2017-10-07 – 2017-10-08 (×2): 0.5 mg via ORAL
  Filled 2017-10-07 (×2): qty 1

## 2017-10-07 MED ORDER — PANTOPRAZOLE SODIUM 40 MG PO TBEC
40.0000 mg | DELAYED_RELEASE_TABLET | Freq: Every day | ORAL | Status: DC
  Administered 2017-10-08: 40 mg via ORAL
  Filled 2017-10-07: qty 1

## 2017-10-07 MED ORDER — BISACODYL 10 MG RE SUPP: 10.0000 mg | Freq: Every day | RECTAL | Status: DC | PRN

## 2017-10-07 MED ORDER — SENNOSIDES-DOCUSATE SODIUM 8.6-50 MG PO TABS: 1.0000 | ORAL_TABLET | Freq: Every evening | ORAL | Status: DC | PRN

## 2017-10-07 MED ORDER — CARVEDILOL 6.25 MG PO TABS
6.2500 mg | ORAL_TABLET | Freq: Two times a day (BID) | ORAL | Status: DC
  Administered 2017-10-07 – 2017-10-09 (×4): 6.25 mg via ORAL
  Filled 2017-10-07 (×4): qty 1

## 2017-10-07 MED ORDER — VITAMIN B-12 100 MCG PO TABS
100.0000 ug | ORAL_TABLET | Freq: Every day | ORAL | Status: DC
  Administered 2017-10-08 – 2017-10-09 (×2): 100 ug via ORAL
  Filled 2017-10-07 (×3): qty 1

## 2017-10-07 NOTE — Consult Note (Signed)
[facility] Consult: 12:57 PM 10/07/2017  LOS: 0 days    Referring Provider: Dr Regenia Skeeter in ED  Primary Care Physician:  Tammi Sou, MD Primary Gastroenterologist:  Dr. Sharlett Iles.      Reason for Consultation:  Painless hematochezia.     HPI: Curtis Jimenez is a 81 y.o. male.  Hx htn.  Hld. Bipolar disorder.  s/p kidney transplant 2013 (renal failure due to htn and lithium toxicity) with stage 3 CKD by labs 10 months ago.  Spinal DJD.  Hypothyroidism.  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  EF 55 to 74%, grade 1 diastolic dysfx, aortic root mildly dilated, mild MV regurge on echo 11/2016.  Pontine CVA, treated with TPA in 11/2016, on chronic Plavix since. GERD.       06/2012 Colonoscopy, pre kidney transplant eval: severe diverticulosis in descending and sigmoid.  O/w normal study.  2005 Colonoscopy: "Normal"    Patient had eaten a snack of ice cream around 9:30 PM. At 11 PM he states got somecramping in his lower abdomen and urgency to defecate. He passed liquid stool which was mostly blood and some stool. The pain subsided. The bleeding when on ovaries the course of about 30 minutes. There was no nausea or vomiting. At about 4 AM, after managing to sleep, he was awoken with a feeling he needed to defecate and proceeded to pass blood for about 20 minutes. This time there wasn't much abdominal pain. He did not become presyncopal but did feel weak. He took his Plavix this morning. He came to the emergency room later this morning. Hgb 11.3, was 12.2 in early 11/2016.   MCV, platelets normal.  * AKI vs worsening of CKD.  Both BUN and creatinine elevated.  coags pending.    atient doesn't use NSAIDs or aspirin products, is compliant with the Plavix. His last normal bowel movement was in  the evening of 10/9. Normal bowel pattern for him is every other day. He has never had rectal bleeding even in minor amounts. Denies history of anemia or blood transfusions or need for iron/B-12/folate supplements.  Past Medical History:  Diagnosis Date  . Anemia   . Arthritis   . Basal cell carcinoma    neck (skin MD 2X per year)  . Bipolar disorder ([place])   . BPH (benign prostatic hyperplasia)    with elevated PSA; followed by Dr. Rosana Hoes at [facility]  . Cataract   . Chronic renal insufficiency, stage III (moderate) (HCC)    Last f/u with neph 07/06/16: Cr stable at 1.57 (GFR 42)  . CVA (cerebral vascular accident) ([place]) 11/2016   [place] (vertebrobasilar--imaging neg), TPA given.  Pt discharged on plavix.  Carotid dopplers ok, echocardiogram ok.  Residual deficit: vertigo but this improved greatly with therapy.  . Gallbladder polyp 2015   Asymptomatic  . GERD (gastroesophageal reflux disease)   . Gout   . Hyperlipidemia   . Hypertension   . Hypothyroidism   . Restless legs syndrome   . S/p cadaver renal transplant 2013  Secondary to HTN +lithium toxicity over 30 yrs caused kidney destruction [facility] transplant MDs)    Past Surgical History:  Procedure Laterality Date  . AV FISTULA PLACEMENT  04/08/2012   Procedure: ARTERIOVENOUS (AV) FISTULA CREATION;  Surgeon: Angelia Mould, MD;  Location: [facility] OR;  Service: Vascular;  Laterality: Left;  Creation of left brachial cephalic arteriovenous fistula  . COLONOSCOPY  2013   Normal except diverticulosis (recall 10 yrs)  . dilation for GERD    . KIDNEY TRANSPLANT  11/06/12   [place] (cadaveric)  . PROSTATE BIOPSY  2011   no malignancy  . TRANSTHORACIC ECHOCARDIOGRAM  11/2016   Normal LV systolic fxn, EF 48-54%.  Grade I DD.  Mild aortic root dilatation, mild MV regurg.    Prior to Admission medications   Medication Sig Start Date End Date Taking? Authorizing Provider  acetaminophen (TYLENOL) 325 MG tablet Take 650 mg by  mouth every 6 (six) hours as needed for mild pain.    [provider]  allopurinol (ZYLOPRIM) 300 MG tablet Take 1 tablet (300 mg total) by mouth daily. 10/07/16   McGowen, Adrian Blackwater, MD  amoxicillin (AMOXIL) 500 MG capsule  01/26/17   [provider]  carvedilol (COREG) 6.25 MG tablet Take 1 tablet (6.25 mg total) by mouth 2 (two) times daily with a meal. Patient taking differently: Taking 2 tablets in the morning and 1 tablet in the evening 12/05/16   Rosalin Hawking, MD  carvedilol (COREG) 6.25 MG tablet 2 tabs po qAM and 1 tab po q evening 08/19/17   McGowen, Adrian Blackwater, MD  Cholecalciferol (VITAMIN D) 2000 UNITS CAPS Take 1 capsule by mouth daily.    [provider]  clopidogrel (PLAVIX) 75 MG tablet Take 1 tablet (75 mg total) by mouth daily. 01/28/17   McGowen, Adrian Blackwater, MD  colchicine 0.6 MG tablet 2 tabs at onset of gout flare, then 1 tab one hour later, then starting the next day take 1 tab bid until gout flare is resolved 04/01/16   McGowen, Adrian Blackwater, MD  hydrOXYzine (ATARAX/VISTARIL) 25 MG tablet Take 10 mg by mouth 2 (two) times daily.     [provider]  levothyroxine (SYNTHROID, LEVOTHROID) 25 MCG tablet TAKE ONE TABLET BY MOUTH ONCE DAILY 02/01/17   McGowen, Adrian Blackwater, MD  mycophenolate (MYFORTIC) 180 MG EC tablet Take 360 mg by mouth 2 (two) times daily.     [provider]  Omega-3 1000 MG CAPS Take 2,000 mg by mouth 2 (two) times daily.  12/06/12   [provider]  omeprazole (PRILOSEC) 20 MG capsule Take 1 capsule (20 mg total) by mouth daily. 07/23/16   McGowen, Adrian Blackwater, MD  risperiDONE (RISPERDAL) 2 MG tablet Take 2 mg by mouth at bedtime.    [provider]  simvastatin (ZOCOR) 20 MG tablet Take 20 mg by mouth daily.     [provider]  sulfamethoxazole-trimethoprim (BACTRIM,SEPTRA) 400-80 MG tablet Take 1 tablet by mouth every Monday, Wednesday, and Friday.    [provider]  tacrolimus (PROGRAF) 0.5 MG  capsule Take 0.5 mg by mouth at bedtime. Also taking 1mg  in the morning    [provider]  tacrolimus (PROGRAF) 1 MG capsule Take 1 mg by mouth every morning. Also taking 0.5mg  at bedtime    [provider]  vitamin B-12 (CYANOCOBALAMIN) 100 MCG tablet Take 100 mcg by mouth daily.    [provider]    Scheduled Meds:  Infusions:  PRN  Meds:    Allergies as of 10/07/2017 - Review Complete 10/07/2017  Allergen Reaction Noted  . Amantadine hcl  04/23/2010  . Chlorpromazine hcl Other (See Comments) 04/04/2014    Family History  Problem Relation Age of Onset  . Cancer Father        BLADDER  . Heart disease Father     Social History   Social History  . Marital status: Married    Spouse name: N/A  . Number of children: N/A  . Years of education: N/A   Occupational History  . Not on file.   Social History Main Topics  . Smoking status: Former Smoker    Years: 16.00    Types: Pipe, Cigars    Quit date: 05/11/1974  . Smokeless tobacco: Never Used  . Alcohol use No  . Drug use: No  . Sexual activity: Not on file   Other Topics Concern  . Not on file   Social History Narrative   Married, 4 children, 9 GGC, [place].   Occupation: retired Marine scientist.  Originally from [place].   Tobacco: quit 1975; smoked pipes and cigars x 15 yrs prior to this.   Alcohol: none.   Exercise: minimal, but is going to sign up for silver sneakers at the [facility].    REVIEW OF SYSTEMS: Constitutional:  Some weakness. ENT:  No nose bleeds Pulm:  No shortness of breath, no cough. CV:  No palpitations, no LE edema. No chest pain GU:  No hematuria, no frequency GI:  Per HPI.  Patient takes omeprazole daily and has no reflux symptoms. No dysphagia. Heme:  Other than the lower GI bleeding starting last night, he does not have unusual bleeding or bruising tendency.   Transfusions:  As never had transfusions. Neuro:  No headaches, no peripheral tingling or  numbness Derm:  No itching, no rash or sores.  Endocrine:  No sweats or chills.  No polyuria or dysuria Immunization:  Did not inquire as to recent immunizations. Travel:  None beyond local counties in last few months.    PHYSICAL EXAM: Vital signs in last 24 hours: Vitals:   10/07/17 1230 10/07/17 1245  BP: (!) 93/58 (!) 90/52  Pulse: 72 73  Resp: 15 14  Temp:    SpO2: 90% 91%   Wt Readings from Last 3 Encounters:  07/21/17 90.8 kg (200 lb 3 oz)  06/14/17 91.2 kg (201 lb)  05/31/17 89.6 kg (197 lb 8 oz)    General: pleasant, cooperative, comfortable, well appearing WM Head:  No facial swelling or signs of head trauma. Slight ptosis on the right eye.  Eyes:  no scleral icterus. Conjunctiva slightly pale. Ears:  Not hard of hearing  Nose:  No discharge or congestion Mouth:  Moist, clear oropharynx. Tongue plans to the right Neck:  No JVD, no masses, no thyromegaly. Lungs:  Clear bilaterally without labored breathing. No cough. Heart: RRR. No MRG. S1, S2 present. Abdomen:  Soft. Non tender. Active bowel sounds. No organomegaly, no hernias, no bruits.  No masses, no pulsatile masses.   Rectal: did not repeat rectal exam. This was performed by ED provider and reveals some small amount of fresh blood.   Musc/Skeltl: no joint swelling, redness or gross deformity. Extremities:  No CCE.  Neurologic:  Alert. Oriented 3. Moves all 4 limbs. No tremor. Limb strength not tested. Skin:  No rash, no sores, no telangiectasia. Tattoos:  none Nodes:  No cervical or inguinal adenopathy.   Psych:  Cooperative, pleasant, not  anxious or depressed.  Intake/Output from previous day: No intake/output data recorded. Intake/Output this shift: No intake/output data recorded.  LAB RESULTS:  Recent Labs  10/07/17 1150  WBC 7.9  HGB 11.3*  HCT 36.2*  PLT 191   BMET Lab Results  Component Value Date   NA 139 10/07/2017   NA 142 05/31/2017   NA 140 12/05/2016   K 5.0 10/07/2017   K  4.3 05/31/2017   K 3.9 12/05/2016   CL 109 10/07/2017   CL 108 05/31/2017   CL 108 12/05/2016   CO2 22 10/07/2017   CO2 23 05/31/2017   CO2 26 12/05/2016   GLUCOSE 162 (H) 10/07/2017   GLUCOSE 114 (H) 05/31/2017   GLUCOSE 105 (H) 12/05/2016   BUN 29 (H) 10/07/2017   BUN 23 05/31/2017   BUN 22 (H) 12/05/2016   CREATININE 1.97 (H) 10/07/2017   CREATININE 1.45 (H) 05/31/2017   CREATININE 1.60 (H) 12/05/2016   CALCIUM 9.0 10/07/2017   CALCIUM 9.2 05/31/2017   CALCIUM 8.6 (L) 12/05/2016   LFT  Recent Labs  10/07/17 1150  PROT 5.3*  ALBUMIN 3.1*  AST 17  ALT 13*  ALKPHOS 45  BILITOT 0.8   PT/INR Lab Results  Component Value Date   INR 1.06 11/30/2016   Hepatitis Panel No results for input(s): HEPBSAG, HCVAB, HEPAIGM, HEPBIGM in the last 72 hours. C-Diff No components found for: CDIFF Lipase     Component Value Date/Time   LIPASE 22 11/30/2016 1334    Drugs of Abuse  No results found for: LABOPIA, COCAINSCRNUR, LABBENZ, AMPHETMU, THCU, LABBARB   RADIOLOGY STUDIES: No results found.    IMPRESSION:   *  Painless hematochezia.  Suspect diverticular bleed   *  S/p kidney transplant 2013.  On immune suppression.    *  Hx CVA 12.2017, chronic Plavix since, last dose 10/11 in AM.     PLAN:     *  ultrasound abdomen just to assure pt does not have AAA.  If he had ruptured AAA or aorto-illiac fistula, he would be much more unstable.  However there is the aorto -iliac atherosclerosis as well as dilated aortic root on xray and echo within the last 10 months, so will assess for aneurysm with ultrasound abdomen.    *  If bleeding starts back up, send for tagged RBC scan.  CT angio>> angiogram is less appealing for this pt with kidney transplant and CKD stage 3 and possible AKI vs progressive disease. .   *  Clear liquid diet.  Continue oral PPI.  Serial CBC.  Coags pending.  Hold Plavix     Azucena Freed  10/07/2017, 12:57 PM Pager: [phone]   Attending  physician's note   I have taken a history, examined the patient and reviewed the chart. I agree with the Advanced Practitioner's note, impression and recommendations. 83 yr M with history of severe left-sided diverticulosis presented with painless hematochezia suspicious for possible diverticular hemorrhage No further episodes since 4 AM Continue to monitor hemoglobin and transfuse as needed Clear liquids We'll plan for RBC tag scan to localize the site of bleeding if he has recurrent bleed  K Denzil Magnuson, MD [phone] Mon-Fri 8a-5p [phone] after 5p, weekends, holidays

## 2017-10-07 NOTE — ED Provider Notes (Signed)
[place] DEPT Provider Note   CSN: [id number] Arrival date & time: 10/07/17  1124     History   Chief Complaint Chief Complaint  Patient presents with  . Rectal Bleeding    HPI NAHIEM DREDGE is a 81 y.o. male.  The history is provided by the patient, the spouse and medical records. No language interpreter was used.   NIDAL RIVET is a 81 y.o. male  with a PMH of anemia, prior CVA on plavix, HLD, HTN, GERD who presents to the Emergency Department complaining of bloody diarrhea this morning. He shouldn't states that at 4 AM, he had bright red loose stool and on the toilet for about 30 minutes. Then, at 11 AM, he had another episode of several bloody loose stools. He began feeling weak and lightheaded, therefore called his primary care provider who instructed him to come to the emergency department. Patient denies history of similar sxs. No abdominal pain, nausea, vomiting, back pain, urinary symptoms. No medications taken prior to arrival for his symptoms. On Plavix, but no other anticoagulants. Patient's last colonoscopy was in 2013. Patient unsure of results or where this was performed. Per chart review, he was seen by Dr. Verl Blalock where colonoscopy showed severe diverticulosis and sigmoid descending colon. No polyps or cancers noted. No active bleeding was noted.   Past Medical History:  Diagnosis Date  . Anemia   . Arthritis   . Basal cell carcinoma    neck (skin MD 2X per year)  . Bipolar disorder ([place])   . BPH (benign prostatic hyperplasia)    with elevated PSA; followed by Dr. Rosana Hoes at [facility]  . Cataract   . Chronic renal insufficiency, stage III (moderate) (HCC)    Last f/u with neph 07/06/16: Cr stable at 1.57 (GFR 42)  . CVA (cerebral vascular accident) ([place]) 11/2016   [place]ine (vertebrobasilar--imaging neg), TPA given.  Pt discharged on plavix.  Carotid dopplers ok, echocardiogram ok.  Residual deficit: vertigo but this improved greatly with  therapy.  . Gallbladder polyp 2015   Asymptomatic  . GERD (gastroesophageal reflux disease)   . Gout   . Hyperlipidemia   . Hypertension   . Hypothyroidism   . Restless legs syndrome   . S/p cadaver renal transplant 2013   Secondary to HTN +lithium toxicity over 30 yrs caused kidney destruction [facility] transplant MDs)    Patient Active Problem List   Diagnosis Date Noted  . GIB (gastrointestinal bleeding) 10/07/2017  . IFG (impaired fasting glucose) 06/08/2017  . Stroke ([place]) 11/30/2016  . Essential hypertension 01/16/2015  . S/p cadaver renal transplant 11/06/2012  . Chronic kidney disease (CKD), stage IV (severe) ([place]) 03/18/2012  . BENIGN PROSTATIC HYPERTROPHY 04/18/2008  . EXTRINSIC ASTHMA, UNSPECIFIED 12/19/2007  . PSA, INCREASED 12/09/2007  . Hypothyroidism 08/12/2007  . Bipolar disorder ([place]) 07/27/2007  . Hyperlipidemia 07/07/2007  . Gout 07/07/2007  . HYPERTENSION 07/07/2007  . GERD 07/07/2007    Past Surgical History:  Procedure Laterality Date  . AV FISTULA PLACEMENT  04/08/2012   Procedure: ARTERIOVENOUS (AV) FISTULA CREATION;  Surgeon: Angelia Mould, MD;  Location: [facility] OR;  Service: Vascular;  Laterality: Left;  Creation of left brachial cephalic arteriovenous fistula  . COLONOSCOPY  2013   Normal except diverticulosis (recall 10 yrs)  . dilation for GERD    . KIDNEY TRANSPLANT  11/06/12   [place] (cadaveric)  . PROSTATE BIOPSY  2011   no malignancy  . TRANSTHORACIC ECHOCARDIOGRAM  11/2016   Normal  LV systolic fxn, EF 50-03%.  Grade I DD.  Mild aortic root dilatation, mild MV regurg.       Home Medications    Prior to Admission medications   Medication Sig Start Date End Date Taking? Authorizing Provider  acetaminophen (TYLENOL) 325 MG tablet Take 650 mg by mouth every 6 (six) hours as needed for mild pain.    [provider]  allopurinol (ZYLOPRIM) 300 MG tablet Take 1 tablet (300 mg total) by mouth daily. 10/07/16   McGowen, Adrian Blackwater,  MD  amoxicillin (AMOXIL) 500 MG capsule  01/26/17   [provider]  carvedilol (COREG) 6.25 MG tablet Take 1 tablet (6.25 mg total) by mouth 2 (two) times daily with a meal. Patient taking differently: Taking 2 tablets in the morning and 1 tablet in the evening 12/05/16   Rosalin Hawking, MD  carvedilol (COREG) 6.25 MG tablet 2 tabs po qAM and 1 tab po q evening 08/19/17   McGowen, Adrian Blackwater, MD  Cholecalciferol (VITAMIN D) 2000 UNITS CAPS Take 1 capsule by mouth daily.    [provider]  clopidogrel (PLAVIX) 75 MG tablet Take 1 tablet (75 mg total) by mouth daily. 01/28/17   McGowen, Adrian Blackwater, MD  colchicine 0.6 MG tablet 2 tabs at onset of gout flare, then 1 tab one hour later, then starting the next day take 1 tab bid until gout flare is resolved 04/01/16   McGowen, Adrian Blackwater, MD  hydrOXYzine (ATARAX/VISTARIL) 25 MG tablet Take 10 mg by mouth 2 (two) times daily.     [provider]  levothyroxine (SYNTHROID, LEVOTHROID) 25 MCG tablet TAKE ONE TABLET BY MOUTH ONCE DAILY 02/01/17   McGowen, Adrian Blackwater, MD  mycophenolate (MYFORTIC) 180 MG EC tablet Take 360 mg by mouth 2 (two) times daily.     [provider]  Omega-3 1000 MG CAPS Take 2,000 mg by mouth 2 (two) times daily.  12/06/12   [provider]  omeprazole (PRILOSEC) 20 MG capsule Take 1 capsule (20 mg total) by mouth daily. 07/23/16   McGowen, Adrian Blackwater, MD  risperiDONE (RISPERDAL) 2 MG tablet Take 2 mg by mouth at bedtime.    [provider]  simvastatin (ZOCOR) 20 MG tablet Take 20 mg by mouth daily.     [provider]  sulfamethoxazole-trimethoprim (BACTRIM,SEPTRA) 400-80 MG tablet Take 1 tablet by mouth every Monday, Wednesday, and Friday.    [provider]  tacrolimus (PROGRAF) 0.5 MG capsule Take 0.5 mg by mouth at bedtime. Also taking 1mg  in the morning    [provider]  tacrolimus (PROGRAF) 1 MG capsule Take 1 mg by mouth every morning. Also taking 0.5mg  at bedtime     [provider]  vitamin B-12 (CYANOCOBALAMIN) 100 MCG tablet Take 100 mcg by mouth daily.    [provider]    Family History Family History  Problem Relation Age of Onset  . Cancer Father        BLADDER  . Heart disease Father     Social History Social History  Substance Use Topics  . Smoking status: Former Smoker    Years: 16.00    Types: Pipe, Cigars    Quit date: 05/11/1974  . Smokeless tobacco: Never Used  . Alcohol use No     Allergies   Amantadine hcl and Chlorpromazine hcl   Review of Systems Review of Systems  Gastrointestinal: Positive for blood in stool and diarrhea.  Neurological: Positive for weakness and light-headedness.  All other systems reviewed and are negative.    Physical Exam Updated Vital Signs BP (!) 101/55   Pulse 74   Temp 97.7 F (36.5 C) (Oral)   Resp 14   SpO2 98%   Physical Exam  Constitutional: He is oriented to person, place, and time. He appears well-developed and well-nourished. No distress.  Pale appearing pleasant elderly male.  HENT:  Head: Normocephalic and atraumatic.  Cardiovascular: Normal rate, regular rhythm and normal heart sounds.   No murmur heard. Pulmonary/Chest: Effort normal and breath sounds normal. No respiratory distress.  Abdominal: Soft. He exhibits no distension.  No abdominal tenderness.  Genitourinary:  Genitourinary Comments: Chaperone present for exam. Bright red blood-tinged stool.  Musculoskeletal: He exhibits no edema.  Neurological: He is alert and oriented to person, place, and time.  Skin: Skin is warm and dry.  Nursing note and vitals reviewed.    ED Treatments / Results  Labs (all labs ordered are listed, but only abnormal results are displayed) Labs Reviewed  COMPREHENSIVE METABOLIC PANEL - Abnormal; Notable for the following:       Result Value   Glucose, Bld 162 (*)    BUN 29 (*)    Creatinine, Ser 1.97 (*)    Total Protein 5.3 (*)    Albumin 3.1 (*)      ALT 13 (*)    GFR calc non Af Amer 31 (*)    GFR calc Af Amer 36 (*)    All other components within normal limits  CBC - Abnormal; Notable for the following:    RBC 4.12 (*)    Hemoglobin 11.3 (*)    HCT 36.2 (*)    All other components within normal limits  POC OCCULT BLOOD, ED - Abnormal; Notable for the following:    Fecal Occult Bld POSITIVE (*)    All other components within normal limits  DIFFERENTIAL  PROTIME-INR  CBC  TROPONIN I  TROPONIN I  TROPONIN I  TYPE AND SCREEN  ABO/RH    EKG  EKG Interpretation None       Radiology No results found.  Procedures Procedures (including critical care time)  CRITICAL CARE Performed by: Ozella Almond Ward   Total critical care time: 35 minutes  Critical care time was exclusive of separately billable procedures and treating other patients.  Critical care was necessary to treat or prevent imminent or life-threatening deterioration.  Critical care was time spent personally by me on the following activities: development of treatment plan with patient and/or surrogate as well as nursing, discussions with consultants, evaluation of patient's response to treatment, examination of patient, obtaining history from patient or surrogate, ordering and performing treatments and interventions, ordering and review of laboratory studies, ordering and review of radiographic studies, pulse oximetry and re-evaluation of patient's condition.  Medications Ordered in ED Medications  pantoprazole (PROTONIX) EC tablet 40 mg (not administered)  carvedilol (COREG) tablet 6.25 mg (not administered)  risperiDONE (RISPERDAL) tablet 2 mg (not administered)  levothyroxine (SYNTHROID, LEVOTHROID) tablet 25 mcg (not administered)  simvastatin (ZOCOR) tablet 20 mg (not administered)  tacrolimus (PROGRAF) capsule 0.5 mg (not administered)  acetaminophen (TYLENOL) tablet 650 mg (not administered)  sulfamethoxazole-trimethoprim (BACTRIM,SEPTRA) 400-80  MG per tablet 1 tablet (not administered)  vitamin B-12 (CYANOCOBALAMIN) tablet 100 mcg (not administered)  allopurinol (ZYLOPRIM) tablet 300 mg (not administered)  Vitamin D CAPS 2,000 Units (not administered)  Omega-3 CAPS 2,000 mg (not administered)  mycophenolate (MYFORTIC) EC tablet 360 mg (not administered)  hydrOXYzine (ATARAX/VISTARIL)  tablet 10 mg (not administered)  0.9 %  sodium chloride infusion (not administered)  senna-docusate (Senokot-S) tablet 1 tablet (not administered)  bisacodyl (DULCOLAX) suppository 10 mg (not administered)  ondansetron (ZOFRAN) tablet 4 mg (not administered)    Or  ondansetron (ZOFRAN) injection 4 mg (not administered)  HYDROcodone-acetaminophen (NORCO/VICODIN) 5-325 MG per tablet 1-2 tablet (not administered)  sodium chloride 0.9 % bolus 1,000 mL (0 mLs Intravenous Stopped 10/07/17 1352)  sodium chloride 0.9 % bolus 1,000 mL (1,000 mLs Intravenous New Bag/Given 10/07/17 1352)     Initial Impression / Assessment and Plan / ED Course  I have reviewed the triage vital signs and the nursing notes.  Pertinent labs & imaging results that were available during my care of the patient were reviewed by me and considered in my medical decision making (see chart for details).    LITTLETON HAUB is a 81 y.o. male who presents to ED for multiple bloody loose stools this morning associated with weakness and lightheadedness. Hypotensive 90/51 upon arrival. IV fluids initiated. Hemoccult + with bright red tinged stool. No active bleeding. Labs reviewed with hgb of 11.3. BUN/Cr of 29/1.97.   12:48 PM - Discussed case with [facility] PA Azucena Freed. GI to see later this afternoon.   1:51 PM - BP improving with fluids. Hospitalist consulted who will admit.   Patient discussed with Dr. Regenia Skeeter who agrees with treatment plan.   Final Clinical Impressions(s) / ED Diagnoses   Final diagnoses:  Lower GI bleed    New Prescriptions New Prescriptions   No  medications on file     Ward, Ozella Almond, PA-C 10/07/17 1448    Sherwood Gambler, MD 10/08/17 2310

## 2017-10-07 NOTE — H&P (Signed)
History and Physical    JEARLD HEMP YNW:[id number] DOB: Apr 20, 1936 DOA: 10/07/2017   PCP: Tammi Sou, MD   Patient coming from:  Home    Chief Complaint: Blood in the stools   HPI: Curtis Jimenez is a 81 y.o. male with medical history significant for hypertension, hyperlipidemia, prior CVA on Plavix, history of anemia of chronic disease, CKD stage 4, GERD, presenting to the ED complaining of bloody diarrhea since yesterday night. He first noticed a 30 minute episode at 11 PM, followed by the another episode at 4 AM, when he woke wth bright red loose stools, which lasted about 30 minutes. After another episode at 11 AM, the patient presented for further evaluation. At the time of admission, he had no further episodes. He has been feeling very weak, and lightheaded, but no vertigo, syncope or presyncope, or confusion.Marland Kitchen He never had any episodes similar to this one. He denies any abdominal pain, nausea or vomiting. He denies any recent long distance trips, or food poisoning or sick contacts. He denies any trauma or recent surgeries in the area. The patient denies any back pain. He denies any dysuria, or gross hematuria. He denies any other bleeding issues such as gum bleeding or nose bleeding. The patient was on Plavix chronically. His last colonoscopy was in 2013, believes that at the time, diverticulosis may have been seen, especially in the sigmoid and descending colon. No polyps or cancers were noted that time. He also reports having an upper endoscopy prior to that he is renal transplant, which was completely normal. He denies any lower extremity swelling.  ED Course:  BP (!) 101/55   Pulse 74   Temp 97.7 F (36.5 C) (Oral)   Resp 14   SpO2 98%    Hemoglobin 11.3, white count 7.9 platelets 191. Creatinine was 1.97, baseline about 1.45. Marland Kitchen Hemoccult positive GI consult was obtained, we will await for further recommendations.  Review of Systems:  As per HPI otherwise all  other systems reviewed and are negative  Past Medical History:  Diagnosis Date  . Anemia   . Arthritis   . Basal cell carcinoma    neck (skin MD 2X per year)  . Bipolar disorder ([place])   . BPH (benign prostatic hyperplasia)    with elevated PSA; followed by Dr. Rosana Hoes at [facility]  . Cataract   . Chronic renal insufficiency, stage III (moderate) (HCC)    Last f/u with neph 07/06/16: Cr stable at 1.57 (GFR 42)  . CVA (cerebral vascular accident) ([place]) 11/2016   Pontine (vertebrobasilar--imaging neg), TPA given.  Pt discharged on plavix.  Carotid dopplers ok, echocardiogram ok.  Residual deficit: vertigo but this improved greatly with therapy.  . Gallbladder polyp 2015   Asymptomatic  . GERD (gastroesophageal reflux disease)   . Gout   . Hyperlipidemia   . Hypertension   . Hypothyroidism   . Restless legs syndrome   . S/p cadaver renal transplant 2013   Secondary to HTN +lithium toxicity over 30 yrs caused kidney destruction [facility] transplant MDs)    Past Surgical History:  Procedure Laterality Date  . AV FISTULA PLACEMENT  04/08/2012   Procedure: ARTERIOVENOUS (AV) FISTULA CREATION;  Surgeon: Angelia Mould, MD;  Location: [facility] OR;  Service: Vascular;  Laterality: Left;  Creation of left brachial cephalic arteriovenous fistula  . COLONOSCOPY  2013   Normal except diverticulosis (recall 10 yrs)  . dilation for GERD    . KIDNEY TRANSPLANT  11/06/12  [facility] (cadaveric)  . PROSTATE BIOPSY  2011   no malignancy  . TRANSTHORACIC ECHOCARDIOGRAM  11/2016   Normal LV systolic fxn, EF 03-47%.  Grade I DD.  Mild aortic root dilatation, mild MV regurg.    Social History Social History   Social History  . Marital status: Married    Spouse name: N/A  . Number of children: N/A  . Years of education: N/A   Occupational History  . Not on file.   Social History Main Topics  . Smoking status: Former Smoker    Years: 16.00    Types: Pipe, Cigars    Quit date: 05/11/1974    . Smokeless tobacco: Never Used  . Alcohol use No  . Drug use: No  . Sexual activity: Not on file   Other Topics Concern  . Not on file   Social History Narrative   Married, 4 children, 9 GGC, [place].   Occupation: retired Marine scientist.  Originally from [place] [employer].   Tobacco: quit 1975; smoked pipes and cigars x 15 yrs prior to this.   Alcohol: none.   Exercise: minimal, but is going to sign up for silver sneakers at the [facility].     Allergies  Allergen Reactions  . Amantadine Hcl     REACTION: itching  . Chlorpromazine Hcl Other (See Comments)    REACTION: "fell flat on face"    Family History  Problem Relation Age of Onset  . Cancer Father        BLADDER  . Heart disease Father       Prior to Admission medications   Medication Sig Start Date End Date Taking? Authorizing Provider  acetaminophen (TYLENOL) 325 MG tablet Take 650 mg by mouth every 6 (six) hours as needed for mild pain.    [provider]  allopurinol (ZYLOPRIM) 300 MG tablet Take 1 tablet (300 mg total) by mouth daily. 10/07/16   McGowen, Adrian Blackwater, MD  amoxicillin (AMOXIL) 500 MG capsule  01/26/17   [provider]  carvedilol (COREG) 6.25 MG tablet Take 1 tablet (6.25 mg total) by mouth 2 (two) times daily with a meal. Patient taking differently: Taking 2 tablets in the morning and 1 tablet in the evening 12/05/16   Rosalin Hawking, MD  carvedilol (COREG) 6.25 MG tablet 2 tabs po qAM and 1 tab po q evening 08/19/17   McGowen, Adrian Blackwater, MD  Cholecalciferol (VITAMIN D) 2000 UNITS CAPS Take 1 capsule by mouth daily.    [provider]  clopidogrel (PLAVIX) 75 MG tablet Take 1 tablet (75 mg total) by mouth daily. 01/28/17   McGowen, Adrian Blackwater, MD  colchicine 0.6 MG tablet 2 tabs at onset of gout flare, then 1 tab one hour later, then starting the next day take 1 tab bid until gout flare is resolved 04/01/16   McGowen, Adrian Blackwater, MD  hydrOXYzine (ATARAX/VISTARIL) 25 MG tablet Take 10 mg by mouth 2  (two) times daily.     [provider]  levothyroxine (SYNTHROID, LEVOTHROID) 25 MCG tablet TAKE ONE TABLET BY MOUTH ONCE DAILY 02/01/17   McGowen, Adrian Blackwater, MD  mycophenolate (MYFORTIC) 180 MG EC tablet Take 360 mg by mouth 2 (two) times daily.     [provider]  Omega-3 1000 MG CAPS Take 2,000 mg by mouth 2 (two) times daily.  12/06/12   [provider]  omeprazole (PRILOSEC) 20 MG capsule Take 1 capsule (20 mg total) by mouth daily. 07/23/16   McGowen, Adrian Blackwater, MD  risperiDONE (RISPERDAL) 2 MG tablet Take 2 mg by mouth at bedtime.    [provider]  simvastatin (ZOCOR) 20 MG tablet Take 20 mg by mouth daily.     [provider]  sulfamethoxazole-trimethoprim (BACTRIM,SEPTRA) 400-80 MG tablet Take 1 tablet by mouth every Monday, Wednesday, and Friday.    [provider]  tacrolimus (PROGRAF) 0.5 MG capsule Take 0.5 mg by mouth at bedtime. Also taking 1mg  in the morning    [provider]  tacrolimus (PROGRAF) 1 MG capsule Take 1 mg by mouth every morning. Also taking 0.5mg  at bedtime    [provider]  vitamin B-12 (CYANOCOBALAMIN) 100 MCG tablet Take 100 mcg by mouth daily.    [provider]    Physical Exam:  Vitals:   10/07/17 1230 10/07/17 1245 10/07/17 1300 10/07/17 1315  BP: (!) 93/58 (!) 90/52 (!) 101/54 (!) 101/55  Pulse: 72 73 70 74  Resp: 15 14 14 14   Temp:      TempSrc:      SpO2: 90% 91% 93% 98%   Constitutional: NAD, calm, comfortable, Pale  Eyes: PERRL, lids and conjunctivae normal ENMT: Mucous membranes are moist, without exudate or lesions  Neck: normal, supple, no masses, no thyromegaly Respiratory: clear to auscultation bilaterally, no wheezing, no crackles. Normal respiratory effort  Cardiovascular:  distant sounds, but Regular rate and rhythm,  murmur, rubs or gallops. No extremity edema. 2+ pedal pulses. No carotid bruits.  Abdomen: Soft, non tender, No hepatosplenomegaly. Bowel  sounds positive.  Musculoskeletal: no clubbing / cyanosis. Moves all extremities Skin: no jaundice,pale  No lesions.  Neurologic: Sensation intact  Strength equal in all extremities Psychiatric:   Alert and oriented x 3. Normal mood.     Labs on Admission: I have personally reviewed following labs and imaging studies  CBC:  Recent Labs Lab 10/07/17 1150  WBC 7.9  NEUTROABS 6.2  HGB 11.3*  HCT 36.2*  MCV 87.9  PLT 284    Basic Metabolic Panel:  Recent Labs Lab 10/07/17 1150  NA 139  K 5.0  CL 109  CO2 22  GLUCOSE 162*  BUN 29*  CREATININE 1.97*  CALCIUM 9.0    GFR: CrCl cannot be calculated (Unknown ideal weight.).  Liver Function Tests:  Recent Labs Lab 10/07/17 1150  AST 17  ALT 13*  ALKPHOS 45  BILITOT 0.8  PROT 5.3*  ALBUMIN 3.1*   No results for input(s): LIPASE, AMYLASE in the last 168 hours. No results for input(s): AMMONIA in the last 168 hours.  Coagulation Profile:  Recent Labs Lab 10/07/17 1150  INR 1.13    Cardiac Enzymes: No results for input(s): CKTOTAL, CKMB, CKMBINDEX, TROPONINI in the last 168 hours.  BNP (last 3 results) No results for input(s): PROBNP in the last 8760 hours.  HbA1C: No results for input(s): HGBA1C in the last 72 hours.  CBG: No results for input(s): GLUCAP in the last 168 hours.  Lipid Profile: No results for input(s): CHOL, HDL, LDLCALC, TRIG, CHOLHDL, LDLDIRECT in the last 72 hours.  Thyroid Function Tests: No results for input(s): TSH, T4TOTAL, FREET4, T3FREE, THYROIDAB in the last 72 hours.  Anemia Panel: No results for input(s): VITAMINB12, FOLATE, FERRITIN, TIBC, IRON, RETICCTPCT in the last 72 hours.  Urine analysis:    Component Value Date/Time   COLORURINE YELLOW 12/01/2016 [place] 12/01/2016 0520   LABSPEC 1.012 12/01/2016 0520   PHURINE 6.5 12/01/2016 0520   GLUCOSEU NEGATIVE 12/01/2016 0520  HGBUR NEGATIVE 12/01/2016 0520   BILIRUBINUR negative 02/09/2017  1510   KETONESUR NEGATIVE 12/01/2016 0520   PROTEINUR negative 02/09/2017 1510   PROTEINUR NEGATIVE 12/01/2016 0520   UROBILINOGEN 0.2 02/09/2017 1510   NITRITE negative 02/09/2017 1510   NITRITE NEGATIVE 12/01/2016 0520   LEUKOCYTESUR Negative 02/09/2017 1510    Sepsis Labs: @LABRCNTIP (procalcitonin:4,lacticidven:4) )No results found for this or any previous visit (from the past 240 hour(s)).   Radiological Exams on Admission: No results found.  EKG: Independently reviewed.  Assessment/Plan Active Problems:   GIB (gastrointestinal bleeding)   Hypothyroidism   Hyperlipidemia   Gout   Bipolar disorder (HCC)   GERD   Chronic kidney disease (CKD), stage IV (severe) (HCC)   IFG (impaired fasting glucose)   Lower GI bleed/Hematochezia in a patient with a history of severe L sided diverticulosis ib the past, with presentation suspicious for possible diverticular hemorrhage. No further episodes since this morning . Hcult positive.  Hemodynamically stable at this time , GI evaluated, Dr. Silverio Decamp, who recommended  Continued monitoring and to plan for RBC tag scan to localize the site of bleeding if bleeding recurs  Admit to telemetry Cycle CBC every 6 hours  Check PT/INR/PTT Clear liquids only Normal saline IV fluid  Protonix 40mg  IV daily Hold all NSAIDs including preadmission baby aspirin Appreciate GI involvement   Chronic kidney disease stage 4   baseline creatinine   S/p cadaver renal transplant 2013 current Cr 1.97 , BL 1.5-1.6  Lab Results  Component Value Date   CREATININE 1.97 (H) 10/07/2017   CREATININE 1.45 (H) 05/31/2017   CREATININE 1.60 (H) 12/05/2016  IVF Repeat CMET in am  Continue Prograf and Myfortic and Septra Mo, We and Fri   Hypertension BP  101/55   Pulse 74    Controlled Continue home anti-hypertensive medications in am    Hyperlipidemia Continue home statins  Hypothyroidism: Continue home Synthroid  Gout, no acute flare Continue  Allopurinol  Benign prostatic hypertrophy, no acute issues Not on meds   History of CVA 11/2016 on Plavix  Hold Plavix today   Bipolar disorder  Continue Risperdal   Impaired fasting glucose, diet controlled Current blood sugar level is 162 Lab Results  Component Value Date   HGBA1C 5.7 (H) 05/31/2017  Hgb A1C May need SSI  Patient refuses insulin at this time   DVT prophylaxis: SCD   Code Status:    Full  Family Communication:  Discussed with patient Disposition Plan: Expect patient to be discharged to home after condition improves Consults called:    GI per EDP Admission status: tele obs    Hinton Luellen E, PA-C [facility]   10/07/2017, 2:30 PM

## 2017-10-07 NOTE — ED Triage Notes (Addendum)
Pt reports last night he had some abdominal pain following eating. He also reports he had 2 episodes of bloody diarrhea. Pt appears pale in color. Slightly hypotensive in triage . Pt reports blood was bright red in color.

## 2017-10-07 NOTE — Telephone Encounter (Signed)
Noted  

## 2017-10-07 NOTE — Telephone Encounter (Signed)
Patient Name: Curtis Jimenez DOB: 08/04/1936 Initial Comment Caller states he has bloody diarrhea Nurse Assessment Nurse: Vallery Sa, RN, [place] Date/Time (Eastern Time): 10/07/2017 10:04:58 AM Confirm and document reason for call. If symptomatic, describe symptoms. ---Elenore Rota states he developed Bloody diarrhea last night (about 2 episodes so far). No injury in the past 3 days. No fever. Alert and responsive. Does the patient have any new or worsening symptoms? ---Yes Will a triage be completed? ---Yes Related visit to physician within the last 2 weeks? ---No Does the PT have any chronic conditions? (i.e. diabetes, asthma, etc.) ---Yes List chronic conditions. ---High Cholesterol, Kidney transplant 5 years ago, Stroke Dec 2017 Is this a behavioral health or substance abuse call? ---No Guidelines Guideline Title Affirmed Question Affirmed Notes Rectal Bleeding [1] MODERATE rectal bleeding (small blood clots, passing blood without stool, or toilet water turns red) AND [2] more than once a day Final Disposition User Go to ED Now Vallery Sa, RN, [facility] - ED Caller Disagree/Comply Comply Caller Understands Yes PreDisposition Call Doctor

## 2017-10-07 NOTE — Progress Notes (Signed)
Pt educated about safety and importance of bed alarm during the night however pt refuses to be on bed alarm. Will continue to round on patient.   Giovanni Bath, RN    

## 2017-10-07 NOTE — ED Notes (Signed)
Pt returned from ultrasound, family remains at bedside.

## 2017-10-07 NOTE — ED Notes (Signed)
PA aware of orthostatic changes.

## 2017-10-08 DIAGNOSIS — F3170 Bipolar disorder, currently in remission, most recent episode unspecified: Secondary | ICD-10-CM | POA: Diagnosis not present

## 2017-10-08 DIAGNOSIS — I129 Hypertensive chronic kidney disease with stage 1 through stage 4 chronic kidney disease, or unspecified chronic kidney disease: Secondary | ICD-10-CM | POA: Diagnosis not present

## 2017-10-08 DIAGNOSIS — N179 Acute kidney failure, unspecified: Secondary | ICD-10-CM

## 2017-10-08 DIAGNOSIS — K922 Gastrointestinal hemorrhage, unspecified: Secondary | ICD-10-CM | POA: Diagnosis not present

## 2017-10-08 DIAGNOSIS — K921 Melena: Secondary | ICD-10-CM | POA: Diagnosis not present

## 2017-10-08 DIAGNOSIS — R7301 Impaired fasting glucose: Secondary | ICD-10-CM | POA: Diagnosis not present

## 2017-10-08 DIAGNOSIS — D62 Acute posthemorrhagic anemia: Secondary | ICD-10-CM

## 2017-10-08 DIAGNOSIS — R69 Illness, unspecified: Secondary | ICD-10-CM | POA: Diagnosis not present

## 2017-10-08 DIAGNOSIS — N184 Chronic kidney disease, stage 4 (severe): Secondary | ICD-10-CM | POA: Diagnosis not present

## 2017-10-08 DIAGNOSIS — E039 Hypothyroidism, unspecified: Secondary | ICD-10-CM

## 2017-10-08 DIAGNOSIS — M1000 Idiopathic gout, unspecified site: Secondary | ICD-10-CM | POA: Diagnosis not present

## 2017-10-08 DIAGNOSIS — N183 Chronic kidney disease, stage 3 (moderate): Secondary | ICD-10-CM | POA: Diagnosis not present

## 2017-10-08 DIAGNOSIS — Z940 Kidney transplant status: Secondary | ICD-10-CM | POA: Diagnosis not present

## 2017-10-08 DIAGNOSIS — Z7901 Long term (current) use of anticoagulants: Secondary | ICD-10-CM | POA: Diagnosis not present

## 2017-10-08 DIAGNOSIS — Z888 Allergy status to other drugs, medicaments and biological substances status: Secondary | ICD-10-CM | POA: Diagnosis not present

## 2017-10-08 DIAGNOSIS — K219 Gastro-esophageal reflux disease without esophagitis: Secondary | ICD-10-CM | POA: Diagnosis not present

## 2017-10-08 DIAGNOSIS — M109 Gout, unspecified: Secondary | ICD-10-CM | POA: Diagnosis not present

## 2017-10-08 DIAGNOSIS — E785 Hyperlipidemia, unspecified: Secondary | ICD-10-CM | POA: Diagnosis not present

## 2017-10-08 DIAGNOSIS — K5791 Diverticulosis of intestine, part unspecified, without perforation or abscess with bleeding: Secondary | ICD-10-CM | POA: Diagnosis not present

## 2017-10-08 LAB — CBC
HCT: 28.4 % — ABNORMAL LOW (ref 39.0–52.0)
HCT: 29.3 % — ABNORMAL LOW (ref 39.0–52.0)
Hemoglobin: 8.8 g/dL — ABNORMAL LOW (ref 13.0–17.0)
Hemoglobin: 9.1 g/dL — ABNORMAL LOW (ref 13.0–17.0)
MCH: 27.1 pg (ref 26.0–34.0)
MCH: 27.6 pg (ref 26.0–34.0)
MCHC: 31 g/dL (ref 30.0–36.0)
MCHC: 31.1 g/dL (ref 30.0–36.0)
MCV: 87.4 fL (ref 78.0–100.0)
MCV: 88.8 fL (ref 78.0–100.0)
PLATELETS: 163 10*3/uL (ref 150–400)
PLATELETS: 171 10*3/uL (ref 150–400)
RBC: 3.25 MIL/uL — AB (ref 4.22–5.81)
RBC: 3.3 MIL/uL — AB (ref 4.22–5.81)
RDW: 13.8 % (ref 11.5–15.5)
RDW: 14.3 % (ref 11.5–15.5)
WBC: 6.3 10*3/uL (ref 4.0–10.5)
WBC: 5.1 10*3/uL (ref 4.0–10.5)

## 2017-10-08 LAB — COMPREHENSIVE METABOLIC PANEL
ALT: 10 U/L — AB (ref 17–63)
AST: 13 U/L — AB (ref 15–41)
Albumin: 2.5 g/dL — ABNORMAL LOW (ref 3.5–5.0)
Alkaline Phosphatase: 36 U/L — ABNORMAL LOW (ref 38–126)
Anion gap: 6 (ref 5–15)
BUN: 23 mg/dL — AB (ref 6–20)
CHLORIDE: 114 mmol/L — AB (ref 101–111)
CO2: 19 mmol/L — AB (ref 22–32)
CREATININE: 1.71 mg/dL — AB (ref 0.61–1.24)
Calcium: 8 mg/dL — ABNORMAL LOW (ref 8.9–10.3)
GFR calc non Af Amer: 37 mL/min — ABNORMAL LOW (ref >60)
GFR, EST AFRICAN AMERICAN: 42 mL/min — AB (ref >60)
Glucose, Bld: 91 mg/dL (ref 65–99)
POTASSIUM: 4.1 mmol/L (ref 3.5–5.1)
SODIUM: 139 mmol/L (ref 135–145)
Total Bilirubin: 0.7 mg/dL (ref 0.3–1.2)
Total Protein: 4.4 g/dL — ABNORMAL LOW (ref 6.5–8.1)

## 2017-10-08 LAB — PROTIME-INR
INR: 1.22
Prothrombin Time: 15.3 seconds — ABNORMAL HIGH (ref 11.4–15.2)

## 2017-10-08 LAB — TROPONIN I: Troponin I: <0.03 ng/mL (ref <0.03)

## 2017-10-08 NOTE — Progress Notes (Signed)
Daily Rounding Note  10/08/2017, 10:30 AM  LOS: 0 days   SUBJECTIVE:   Chief complaint: hematochezia.  None since episodes at home.  Feels well.  Not dizzy or weak.  Walking in room.  No palpitations or chest pain.  Hungry.       OBJECTIVE:         Vital signs in last 24 hours:    Temp:  [97.7 F (36.5 C)-98.1 F (36.7 C)] 98 F (36.7 C) (10/12 0636) Pulse Rate:  [70-89] 78 (10/12 0636) Resp:  [14-21] 18 (10/12 0636) BP: (79-119)/(50-67) 114/54 (10/12 0636) SpO2:  [90 %-98 %] 93 % (10/12 0636) Weight:  [90.9 kg (200 lb 6.4 oz)-91.2 kg (201 lb 1 oz)] 90.9 kg (200 lb 6.4 oz) (10/12 0636) Last BM Date: 10/07/17 Filed Weights   10/07/17 1849 10/08/17 0636  Weight: 91.2 kg (201 lb 1 oz) 90.9 kg (200 lb 6.4 oz)   General: looks well.    Heart: RRR Chest: some rales in left base.  No cough or dyspnea Abdomen: soft, NT, ND.  Active BS  Extremities: no CCE Neuro/Psych:  Oriented x 3.  A bit HOH.  No tremor or limb weakness.    Intake/Output from previous day: 10/11 0701 - 10/12 0700 In: 2157.5 [P.O.:240; I.V.:917.5; IV Piggyback:1000] Out: 1350 [Urine:1350]  Intake/Output this shift: Total I/O In: 240 [P.O.:240] Out: 800 [Urine:800]  Lab Results:  Recent Labs  10/07/17 1150 10/07/17 2005 10/08/17 0305  WBC 7.9 6.8 6.3  HGB 11.3* 9.1* 8.8*  HCT 36.2* 29.3* 28.4*  PLT 191 161 163   BMET  Recent Labs  10/07/17 1150 10/08/17 0305  NA 139 139  K 5.0 4.1  CL 109 114*  CO2 22 19*  GLUCOSE 162* 91  BUN 29* 23*  CREATININE 1.97* 1.71*  CALCIUM 9.0 8.0*   LFT  Recent Labs  10/07/17 1150 10/08/17 0305  PROT 5.3* 4.4*  ALBUMIN 3.1* 2.5*  AST 17 13*  ALT 13* 10*  ALKPHOS 45 36*  BILITOT 0.8 0.7   PT/INR  Recent Labs  10/07/17 1150 10/08/17 0305  LABPROT 14.4 15.3*  INR 1.13 1.22   Hepatitis Panel No results for input(s): HEPBSAG, HCVAB, HEPAIGM, HEPBIGM in the last 72  hours.  Studies/Results: US Aorta  Result Date: 10/07/2017 CLINICAL DATA:  Known peripheral vascular disease. Assess abdominal aorta for aneurysm. EXAM: ULTRASOUND OF ABDOMINAL AORTA TECHNIQUE: Ultrasound examination of the abdominal aorta was performed to evaluate for abdominal aortic aneurysm. COMPARISON:  Lumbar spine series of February 09, 2017 which revealed dense calcification in the wall of the abdominal aorta. FINDINGS: Abdominal aortic measurements as follows: Proximal:  2.9 cm AP x 2.8 cm transversely. Mid:  2.1 cm AP x 2.2 cm transversely. Distal:  1.5 cm AP x 1.7 cm transversely. IMPRESSION: Tapering caliber of the abdominal aorta with maximal diameter of 2.9 cm AP occurring occurring in the proximal aorta. Ectatic abdominal aorta at risk for aneurysm development. Recommend followup by ultrasound in 5 years. This recommendation follows ACR consensus guidelines: White Paper of the ACR Incidental Findings Committee II on Vascular Findings. J Am Coll Radiol 2013; 10:789-794. Electronically Signed   By: David  Martinique M.D.   On: 10/07/2017 15:50    ASSESMENT:   *  Painless hematochezia.  Presumed diverticular bleed.  Appears to have ceased  *  Blood loss anemia.  Hgb 11.3 >>  9.1 >> 8.8.  Normal MCV.   Not symptomatic.    *  Chronic Plavix, on hold.  For hx CVA in 11/2016.    *  Renal transplant pt.  Stage 3  CKD.    *  Hypotension, not symptomatic   PLAN   *  Regular diet.   Hgb this PM and in AM.  Observe overnight looking for Hgb to stableize.      Azucena Freed  10/08/2017, 10:30 AM Pager: [phone]  Attending physician's note   I have taken an interval history, reviewed the chart and examined the patient. I agree with the Advanced Practitioner's note, impression and recommendations.  No further episodes of hematochezia Advance diet Ok to DC home tomorrow if no further bleeding Follow up in GI office visit next available  K Denzil Magnuson, MD [phone] Mon-Fri  8a-5p [phone] after 5p, weekends, holidays

## 2017-10-08 NOTE — Progress Notes (Signed)
PROGRESS NOTE    Curtis Jimenez  PYK:[id number] DOB: [birth year] DOA: 10/07/2017 PCP: Tammi Sou, MD       Brief Narrative:  81 yo M with PMHx significant for renal tx, Hx CVA on Plavix, HTN presents with painless rectal bleeding.  Was fine until Weds 11PM, had large BM, red blood.  Next morning at 4AM same.  Came to ER that morning.  Rectal grossly bloody, Hgb 11 (from baseline 12), no further bleeding in ER.  US aorta was obtained which ruled out ruptured AAA, patient remained stable iwthout further bleeding over last 24 hours.  GI Dr. Silverio Decamp evaluated.   Assessment & Plan:  Active Problems:   Hypothyroidism   Hyperlipidemia   Gout   Bipolar disorder (HCC)   GERD   Chronic kidney disease (CKD), stage IV (severe) (HCC)   IFG (impaired fasting glucose)   GIB (gastrointestinal bleeding)   Suspected diverticular bleeding Unclear why tagged cell scan not obtained, but at this point, bleeding seems to have stopped.   -Hold Plavix -GI consultation appreciated -Clear liquids only -Monitor for ongoing blood loss today -If more rectal bleeding, will repeat Hgb tonight and obtain tagged RBC scan  Acute lbood loss anemia Hgb 8.8 today. Transfusion threshold probably 8 given his CKD.  INR normal. -Hold Plavix -Repeat CBC tomorrow   Acute on chronic kidney injury Probably pre-renal in setting of blood loss.  Baseline Cr 1.4, was 1.9 on admission, trending down.  Has cadaveric kidney. -Continue IVF -Trend BMP -Obtain UA -Continue Myfortic, Prograf, Septra  Hypertension -Continue Coreg, statin  Hypothyroidism -Continue levothyroxine  Bipolar -Continue Risperdal  Other medications -Continue Norco, atarax -Continue allopurinol -Continue oral PPI      DVT prophylaxis: SCDs Code Status: FULL Family Communication: None present Disposition Plan: Clear liquids today.  Monitor for ongoing bleeding.  Frankly, AKI is likely more barrier to discharge than  bleeding.   Consultants:   GI, Dr. Silverio Decamp  Procedures:   None  Antimicrobials:   None    Subjective: Feeling well.  No more rectal bleeding.  No melena.  No abdominal pain.    Objective: Vitals:   10/07/17 1849 10/07/17 2127 10/08/17 0025 10/08/17 0636  BP: (!) 105/54 (!) 105/51 (!) 110/56 (!) 114/54  Pulse: 78 78 81 78  Resp:  18 18 18   Temp: 97.8 F (36.6 C) 97.7 F (36.5 C) 98.1 F (36.7 C) 98 F (36.7 C)  TempSrc: Oral Oral Oral Oral  SpO2: 93% 95% 97% 93%  Weight: 91.2 kg (201 lb 1 oz)   90.9 kg (200 lb 6.4 oz)  Height: 5\' 10"  (1.778 m)       Intake/Output Summary (Last 24 hours) at 10/08/17 0830 Last data filed at 10/08/17 5397  Gross per 24 hour  Intake           2157.5 ml  Output             1350 ml  Net            807.5 ml   Filed Weights   10/07/17 1849 10/08/17 0636  Weight: 91.2 kg (201 lb 1 oz) 90.9 kg (200 lb 6.4 oz)    Examination:  General exam: Appears calm and comfortable  Respiratory system: Clear to auscultation. Respiratory effort normal. Cardiovascular system: S1 & S2 heard, RRR. No JVD, murmurs, rubs, gallops or clicks. No pedal edema. Gastrointestinal system: Abdomen is nondistended, soft and nontender. No organomegaly or masses felt. Normal bowel sounds heard.  Central nervous system: Alert and oriented. No focal neurological deficits. Extremities: Symmetric 5 x 5 power. Skin: No rashes, lesions or ulcers Psychiatry: Judgement and insight appear normal. Mood & affect appropriate.     Data Reviewed: I have personally reviewed following labs and imaging studies  CBC:  Recent Labs Lab 2017/10/17 1150 10-17-2017 2005 10/08/17 0305  WBC 7.9 6.8 6.3  NEUTROABS 6.2  --   --   HGB 11.3* 9.1* 8.8*  HCT 36.2* 29.3* 28.4*  MCV 87.9 87.7 87.4  PLT 191 161 856   Basic Metabolic Panel:  Recent Labs Lab Oct 17, 2017 1150 10/08/17 0305  NA 139 139  K 5.0 4.1  CL 109 114*  CO2 22 19*  GLUCOSE 162* 91  BUN 29* 23*  CREATININE  1.97* 1.71*  CALCIUM 9.0 8.0*   GFR: Estimated Creatinine Clearance: 40.4 mL/min (A) (by C-G formula based on SCr of 1.71 mg/dL (H)). Liver Function Tests:  Recent Labs Lab 10-17-17 1150 10/08/17 0305  AST 17 13*  ALT 13* 10*  ALKPHOS 45 36*  BILITOT 0.8 0.7  PROT 5.3* 4.4*  ALBUMIN 3.1* 2.5*   No results for input(s): LIPASE, AMYLASE in the last 168 hours. No results for input(s): AMMONIA in the last 168 hours. Coagulation Profile:  Recent Labs Lab 2017/10/17 1150 10/08/17 0305  INR 1.13 1.22   Cardiac Enzymes:  Recent Labs Lab October 17, 2017 1715 October 17, 2017 2005 10/08/17 0305  TROPONINI <0.03 <0.03 <0.03   BNP (last 3 results) No results for input(s): PROBNP in the last 8760 hours. HbA1C: No results for input(s): HGBA1C in the last 72 hours. CBG: No results for input(s): GLUCAP in the last 168 hours. Lipid Profile: No results for input(s): CHOL, HDL, LDLCALC, TRIG, CHOLHDL, LDLDIRECT in the last 72 hours. Thyroid Function Tests: No results for input(s): TSH, T4TOTAL, FREET4, T3FREE, THYROIDAB in the last 72 hours. Anemia Panel: No results for input(s): VITAMINB12, FOLATE, FERRITIN, TIBC, IRON, RETICCTPCT in the last 72 hours. Urine analysis:    Component Value Date/Time   COLORURINE YELLOW 12/01/2016 0520   APPEARANCEUR CLEAR 12/01/2016 0520   LABSPEC 1.012 12/01/2016 0520   PHURINE 6.5 12/01/2016 0520   GLUCOSEU NEGATIVE 12/01/2016 0520   HGBUR NEGATIVE 12/01/2016 0520   BILIRUBINUR negative 02/09/2017 1510   KETONESUR NEGATIVE 12/01/2016 0520   PROTEINUR negative 02/09/2017 1510   PROTEINUR NEGATIVE 12/01/2016 0520   UROBILINOGEN 0.2 02/09/2017 1510   NITRITE negative 02/09/2017 1510   NITRITE NEGATIVE 12/01/2016 0520   LEUKOCYTESUR Negative 02/09/2017 1510   Sepsis Labs: @LABRCNTIP (procalcitonin:4,lacticidven:4)  )No results found for this or any previous visit (from the past 240 hour(s)).       Radiology Studies: US Aorta  Result Date:  10-17-17 CLINICAL DATA:  Known peripheral vascular disease. Assess abdominal aorta for aneurysm. EXAM: ULTRASOUND OF ABDOMINAL AORTA TECHNIQUE: Ultrasound examination of the abdominal aorta was performed to evaluate for abdominal aortic aneurysm. COMPARISON:  Lumbar spine series of February 09, 2017 which revealed dense calcification in the wall of the abdominal aorta. FINDINGS: Abdominal aortic measurements as follows: Proximal:  2.9 cm AP x 2.8 cm transversely. Mid:  2.1 cm AP x 2.2 cm transversely. Distal:  1.5 cm AP x 1.7 cm transversely. IMPRESSION: Tapering caliber of the abdominal aorta with maximal diameter of 2.9 cm AP occurring occurring in the proximal aorta. Ectatic abdominal aorta at risk for aneurysm development. Recommend followup by ultrasound in 5 years. This recommendation follows ACR consensus guidelines: White Paper of the ACR Incidental Findings Committee II  on Vascular Findings. J Am Coll Radiol 2013; 10:789-794. Electronically Signed   By: David  Martinique M.D.   On: 10/07/2017 15:50        Scheduled Meds: . allopurinol  300 mg Oral Daily  . carvedilol  6.25 mg Oral BID WC  . cholecalciferol  1,000 Units Oral Daily  . hydrOXYzine  10 mg Oral BID  . levothyroxine  25 mcg Oral QAC breakfast  . mycophenolate  360 mg Oral BID  . omega-3 acid ethyl esters  2 g Oral BID  . pantoprazole  40 mg Oral Daily  . risperiDONE  2 mg Oral QHS  . simvastatin  20 mg Oral Daily  . sulfamethoxazole-trimethoprim  1 tablet Oral Q M,W,F  . tacrolimus  0.5 mg Oral QHS  . tacrolimus  1 mg Oral Daily  . vitamin B-12  100 mcg Oral Daily   Continuous Infusions: . sodium chloride 75 mL/hr at 10/08/17 0240     LOS: 0 days    Time spent: 25 minutes    Edwin Dada, MD [facility] Pager [phone]  If 7PM-7AM, please contact night-coverage [email or web address] Password [email or web address] 10/08/2017, 8:30 AM

## 2017-10-08 NOTE — Progress Notes (Signed)
Patient with no complaints or concerns during 7pm - 7am shift. Slept during the night. No bloody stool since admission to [facility].  Will continue to monitor.  Massa Pe, RN

## 2017-10-08 NOTE — Care Management Note (Signed)
Case Management Note  Patient Details  Name: KELDAN EPLIN MRN: [id number] Date of Birth: Jan 21, 1936  Subjective/Objective:                 Patient in obs from home w wife, here for evaluation of bloody stools. Independent PTA. Denies barriers to obtaining medications, MD visits. No CM needs identified at this time.    Action/Plan:  CM will continue to follow for DC planning.  Expected Discharge Date:                  Expected Discharge Plan:  Home/Self Care  In-House Referral:     Discharge planning Services  CM Consult  Post Acute Care Choice:    Choice offered to:     DME Arranged:    DME Agency:     HH Arranged:    HH Agency:     Status of Service:  In process, will continue to follow  If discussed at Long Length of Stay Meetings, dates discussed:    Additional Comments:  Carles Collet, RN 10/08/2017, 9:32 AM

## 2017-10-08 NOTE — Care Management Obs Status (Signed)
[facility] NOTIFICATION   Patient Details  Name: DERRECK WILTSEY MRN: [id number] Date of Birth: [birth year]   Medicare Observation Status Notification Given:  Yes    Carles Collet, RN 10/08/2017, 9:32 AM

## 2017-10-08 NOTE — Progress Notes (Signed)
Had a bowel movement this afternoon,red blood noted in stool.

## 2017-10-09 DIAGNOSIS — K5791 Diverticulosis of intestine, part unspecified, without perforation or abscess with bleeding: Secondary | ICD-10-CM

## 2017-10-09 DIAGNOSIS — N183 Chronic kidney disease, stage 3 (moderate): Secondary | ICD-10-CM | POA: Diagnosis not present

## 2017-10-09 DIAGNOSIS — Z940 Kidney transplant status: Secondary | ICD-10-CM

## 2017-10-09 DIAGNOSIS — N184 Chronic kidney disease, stage 4 (severe): Secondary | ICD-10-CM | POA: Diagnosis not present

## 2017-10-09 DIAGNOSIS — K922 Gastrointestinal hemorrhage, unspecified: Secondary | ICD-10-CM | POA: Diagnosis not present

## 2017-10-09 DIAGNOSIS — M1000 Idiopathic gout, unspecified site: Secondary | ICD-10-CM | POA: Diagnosis not present

## 2017-10-09 DIAGNOSIS — E039 Hypothyroidism, unspecified: Secondary | ICD-10-CM | POA: Diagnosis not present

## 2017-10-09 DIAGNOSIS — N179 Acute kidney failure, unspecified: Secondary | ICD-10-CM | POA: Diagnosis not present

## 2017-10-09 DIAGNOSIS — R69 Illness, unspecified: Secondary | ICD-10-CM | POA: Diagnosis not present

## 2017-10-09 LAB — BASIC METABOLIC PANEL
Anion gap: 7 (ref 5–15)
BUN: 22 mg/dL — ABNORMAL HIGH (ref 6–20)
CALCIUM: 8.4 mg/dL — AB (ref 8.9–10.3)
CHLORIDE: 112 mmol/L — AB (ref 101–111)
CO2: 22 mmol/L (ref 22–32)
CREATININE: 1.67 mg/dL — AB (ref 0.61–1.24)
GFR calc non Af Amer: 38 mL/min — ABNORMAL LOW (ref >60)
GFR, EST AFRICAN AMERICAN: 44 mL/min — AB (ref >60)
Glucose, Bld: 97 mg/dL (ref 65–99)
Potassium: 3.9 mmol/L (ref 3.5–5.1)
SODIUM: 141 mmol/L (ref 135–145)

## 2017-10-09 LAB — CBC
HCT: 28.9 % — ABNORMAL LOW (ref 39.0–52.0)
HEMOGLOBIN: 8.9 g/dL — AB (ref 13.0–17.0)
MCH: 27.3 pg (ref 26.0–34.0)
MCHC: 30.8 g/dL (ref 30.0–36.0)
MCV: 88.7 fL (ref 78.0–100.0)
Platelets: 183 10*3/uL (ref 150–400)
RBC: 3.26 MIL/uL — ABNORMAL LOW (ref 4.22–5.81)
RDW: 13.8 % (ref 11.5–15.5)
WBC: 5.5 10*3/uL (ref 4.0–10.5)

## 2017-10-09 MED ORDER — CARVEDILOL 6.25 MG PO TABS: 6.2500 mg | ORAL_TABLET | Freq: Two times a day (BID) | ORAL | Status: DC

## 2017-10-09 MED ORDER — CARVEDILOL 6.25 MG PO TABS: 6.2500 mg | ORAL_TABLET | Freq: Every evening | ORAL | Status: DC

## 2017-10-09 MED ORDER — FERROUS SULFATE 325 (65 FE) MG PO TABS: 325.0000 mg | ORAL_TABLET | ORAL | 0 refills | Status: DC

## 2017-10-09 MED ORDER — CARVEDILOL 12.5 MG PO TABS
12.5000 mg | ORAL_TABLET | Freq: Every day | ORAL | Status: DC
  Administered 2017-10-09: 12.5 mg via ORAL
  Filled 2017-10-09: qty 1

## 2017-10-09 NOTE — Progress Notes (Signed)
Patient stated that he had a small BM this morning.Stool was black, no blood noted.   Will continue to monitor.  Kierre Deines, RN

## 2017-10-09 NOTE — Discharge Summary (Signed)
Pt got discharged to home, discharge instructions provided and patient showed understanding to it, IV taken out,Telemonitor DC,pt left unit in wheelchair with all of the belongings accompanied with a family member (wife) 

## 2017-10-09 NOTE — Discharge Summary (Signed)
Physician Discharge Summary  SCOTTI KOSTA GGY:[id number] DOB: 01/29/1936 DOA: 10/07/2017  PCP: Tammi Sou, MD  Admit date: 10/07/2017 Discharge date: 10/09/2017  Admitted From: Home Disposition:  Home  Recommendations for Outpatient Follow-up:  1. Follow up with Gastroenterology in 2 weeks 2. Repeat CBC in 2 weeks, or later if asymptomatic  Home Health: No Equipment/Devices: No  Discharge Condition: Good  CODE STATUS: FULL Diet recommendation: Regular  Brief/Interim Summary: The patient was in his normal state of health until the evening before admission at 11PM he had a large painless bloody bowel movement.  At 4AM the next morning he had another painless red BM, and felt dizzy and weak so he came to the ER.  At arrival the patient's Hgb had dropped from baseline in the 11s to mid-8 and he had a mild acute kidney injury.  He had no further hematochezia in the hospital, and stools were brown after two days.  He was evaluated by Gastroenterology, who recommended observation.   Discharge Diagnoses:  Active Problems:   Hypothyroidism   Hyperlipidemia   Gout   Bipolar disorder (HCC)   GERD   Chronic kidney disease (CKD), stage IV (severe) (HCC)   IFG (impaired fasting glucose)   GIB (gastrointestinal bleeding)    Discharge Instructions  Discharge Instructions    Diet - low sodium heart healthy    Complete by:  As directed    Discharge instructions    Complete by:  As directed    From Dr. Loleta Books: Schedule a follow up appointment with Dr. Woodward Ku office in 2 weeks [phone] Do not take your Plavix until then.  If you have bleeding again, call Dr. Woodward Ku office.  If you have bleeding again and feel dizzy, weak, out of breath, or pass out, call 9-1-1 immediately.  Take ferrous sulfate 325 mg every other day for 2 months. You may want to take with a stool softener like Colace/docusate or MiraLAX, if you have constipation.  YOUR PRESCRIPTION WAS  SENT ELECTRONICALLY TO THE [facility] ON [address].   Increase activity slowly    Complete by:  As directed      Allergies as of 10/09/2017      Reactions   Amantadine Hcl Itching   REACTION: itching   Chlorpromazine Hcl    REACTION: "fell flat on face"      Medication List    STOP taking these medications   clopidogrel 75 MG tablet Commonly known as:  PLAVIX     TAKE these medications   acetaminophen 325 MG tablet Commonly known as:  TYLENOL Take 650 mg by mouth every 6 (six) hours as needed for mild pain.   allopurinol 300 MG tablet Commonly known as:  ZYLOPRIM Take 1 tablet (300 mg total) by mouth daily.   carvedilol 6.25 MG tablet Commonly known as:  COREG Take 1 tablet (6.25 mg total) by mouth 2 (two) times daily with a meal. What changed:  how much to take  how to take this  when to take this  additional instructions  Another medication with the same name was removed. Continue taking this medication, and follow the directions you see here.   colchicine 0.6 MG tablet 2 tabs at onset of gout flare, then 1 tab one hour later, then starting the next day take 1 tab bid until gout flare is resolved   ferrous sulfate 325 (65 FE) MG tablet Take 1 tablet (325 mg total) by mouth every other day.  hydrOXYzine 10 MG tablet Commonly known as:  ATARAX/VISTARIL Take 10 mg by mouth 2 (two) times daily as needed.   levothyroxine 25 MCG tablet Commonly known as:  SYNTHROID, LEVOTHROID TAKE ONE TABLET BY MOUTH ONCE DAILY   mycophenolate 180 MG EC tablet Commonly known as:  MYFORTIC Take 360 mg by mouth 2 (two) times daily.   Omega-3 1000 MG Caps Take 2,000 mg by mouth 2 (two) times daily.   omeprazole 20 MG capsule Commonly known as:  PRILOSEC Take 1 capsule (20 mg total) by mouth daily.   risperiDONE 2 MG tablet Commonly known as:  RISPERDAL Take 2 mg by mouth at bedtime.   simvastatin 20 MG tablet Commonly known as:  ZOCOR Take 20 mg by  mouth daily.   sulfamethoxazole-trimethoprim 400-80 MG tablet Commonly known as:  BACTRIM,SEPTRA Take 1 tablet by mouth every Monday, Wednesday, and Friday.   tacrolimus 0.5 MG capsule Commonly known as:  PROGRAF Take 0.5 mg by mouth at bedtime. Also taking 1mg  in the morning   tacrolimus 1 MG capsule Commonly known as:  PROGRAF Take 1 mg by mouth every morning. Also taking 0.5mg  at bedtime   vitamin B-12 100 MCG tablet Commonly known as:  CYANOCOBALAMIN Take 100 mcg by mouth daily.   Vitamin D 2000 units Caps Take 1 capsule by mouth daily.      Follow-up Information    McGowen, Adrian Blackwater, MD.   Specialty:  Family Medicine Contact information: [address] [place] [address] [phone]          Allergies  Allergen Reactions  . Amantadine Hcl Itching    REACTION: itching  . Chlorpromazine Hcl     REACTION: "fell flat on face"    Consultations:  Gastroenterology, Velora Heckler   Procedures/Studies: US Aorta  Result Date: 10/07/2017 CLINICAL DATA:  Known peripheral vascular disease. Assess abdominal aorta for aneurysm. EXAM: ULTRASOUND OF ABDOMINAL AORTA TECHNIQUE: Ultrasound examination of the abdominal aorta was performed to evaluate for abdominal aortic aneurysm. COMPARISON:  Lumbar spine series of February 09, 2017 which revealed dense calcification in the wall of the abdominal aorta. FINDINGS: Abdominal aortic measurements as follows: Proximal:  2.9 cm AP x 2.8 cm transversely. Mid:  2.1 cm AP x 2.2 cm transversely. Distal:  1.5 cm AP x 1.7 cm transversely. IMPRESSION: Tapering caliber of the abdominal aorta with maximal diameter of 2.9 cm AP occurring occurring in the proximal aorta. Ectatic abdominal aorta at risk for aneurysm development. Recommend followup by ultrasound in 5 years. This recommendation follows ACR consensus guidelines: White Paper of the ACR Incidental Findings Committee II on Vascular Findings. J Am Coll Radiol 2013; 10:789-794.  Electronically Signed   By: David  Martinique M.D.   On: 10/07/2017 15:50       Subjective: Feeling well today.  Good appetite.  BM with no blood this morning.  No abdominal pain.  Discharge Exam: Vitals:   10/09/17 0449 10/09/17 0800  BP: (!) 143/71 130/80  Pulse: 89   Resp: 18 18  Temp: 98 F (36.7 C) 98 F (36.7 C)  SpO2: 93% 95%   Vitals:   10/08/17 2045 10/09/17 0016 10/09/17 0449 10/09/17 0800  BP: (!) 116/57 (!) 139/55 (!) 143/71 130/80  Pulse: 86 90 89   Resp: 18 18 18 18   Temp: 98.4 F (36.9 C) 98.9 F (37.2 C) 98 F (36.7 C) 98 F (36.7 C)  TempSrc: Oral Oral Oral Oral  SpO2: 94% 93% 93% 95%  Weight:  90.9 kg (200 lb 6.4 oz)   Height:        General: Pt is alert, awake, not in acute distress Cardiovascular: RRR, S1/S2 +, no rubs, no gallops Respiratory: CTA bilaterally, no wheezing, no rhonchi Abdominal: Soft, NT, ND, bowel sounds + Extremities: no edema, no cyanosis    The results of significant diagnostics from this hospitalization (including imaging, microbiology, ancillary and laboratory) are listed below for reference.     Microbiology: No results found for this or any previous visit (from the past 240 hour(s)).   Labs: BNP (last 3 results) No results for input(s): BNP in the last 8760 hours. Basic Metabolic Panel:  Recent Labs Lab 10/07/17 1150 10/08/17 0305 10/09/17 0431  NA 139 139 141  K 5.0 4.1 3.9  CL 109 114* 112*  CO2 22 19* 22  GLUCOSE 162* 91 97  BUN 29* 23* 22*  CREATININE 1.97* 1.71* 1.67*  CALCIUM 9.0 8.0* 8.4*   Liver Function Tests:  Recent Labs Lab 10/07/17 1150 10/08/17 0305  AST 17 13*  ALT 13* 10*  ALKPHOS 45 36*  BILITOT 0.8 0.7  PROT 5.3* 4.4*  ALBUMIN 3.1* 2.5*   No results for input(s): LIPASE, AMYLASE in the last 168 hours. No results for input(s): AMMONIA in the last 168 hours. CBC:  Recent Labs Lab 10/07/17 1150 10/07/17 2005 10/08/17 0305 10/08/17 1623 10/09/17 0431  WBC 7.9 6.8 6.3 5.1  5.5  NEUTROABS 6.2  --   --   --   --   HGB 11.3* 9.1* 8.8* 9.1* 8.9*  HCT 36.2* 29.3* 28.4* 29.3* 28.9*  MCV 87.9 87.7 87.4 88.8 88.7  PLT 191 161 163 171 183   Cardiac Enzymes:  Recent Labs Lab 10/07/17 1715 10/07/17 2005 10/08/17 0305  TROPONINI <0.03 <0.03 <0.03   BNP: Invalid input(s): POCBNP CBG: No results for input(s): GLUCAP in the last 168 hours. D-Dimer No results for input(s): DDIMER in the last 72 hours. Hgb A1c No results for input(s): HGBA1C in the last 72 hours. Lipid Profile No results for input(s): CHOL, HDL, LDLCALC, TRIG, CHOLHDL, LDLDIRECT in the last 72 hours. Thyroid function studies No results for input(s): TSH, T4TOTAL, T3FREE, THYROIDAB in the last 72 hours.  Invalid input(s): FREET3 Anemia work up No results for input(s): VITAMINB12, FOLATE, FERRITIN, TIBC, IRON, RETICCTPCT in the last 72 hours. Urinalysis    Component Value Date/Time   COLORURINE YELLOW 12/01/2016 0520   APPEARANCEUR CLEAR 12/01/2016 0520   LABSPEC 1.012 12/01/2016 0520   PHURINE 6.5 12/01/2016 0520   GLUCOSEU NEGATIVE 12/01/2016 0520   HGBUR NEGATIVE 12/01/2016 0520   BILIRUBINUR negative 02/09/2017 1510   KETONESUR NEGATIVE 12/01/2016 0520   PROTEINUR negative 02/09/2017 1510   PROTEINUR NEGATIVE 12/01/2016 0520   UROBILINOGEN 0.2 02/09/2017 1510   NITRITE negative 02/09/2017 1510   NITRITE NEGATIVE 12/01/2016 0520   LEUKOCYTESUR Negative 02/09/2017 1510   Sepsis Labs Invalid input(s): PROCALCITONIN,  WBC,  LACTICIDVEN Microbiology No results found for this or any previous visit (from the past 240 hour(s)).   Time coordinating discharge: Over 30 minutes  SIGNED:   Edwin Dada, MD  [facility] 10/09/2017, 1:57 PM   If 7PM-7AM, please contact night-coverage [email or web address] Password [email or web address]

## 2017-10-11 ENCOUNTER — Encounter: Payer: Self-pay | Admitting: Family Medicine

## 2017-10-11 ENCOUNTER — Telehealth: Payer: Self-pay | Admitting: Family Medicine

## 2017-10-11 ENCOUNTER — Encounter: Payer: Self-pay | Admitting: *Deleted

## 2017-10-11 ENCOUNTER — Telehealth: Payer: Self-pay | Admitting: Gastroenterology

## 2017-10-11 NOTE — Telephone Encounter (Signed)
Patient's wife Curtis Jimenez calling to schedule appt for post hospital follow up.  Patient was d/c from [facility] on Saturday and was instructed to contact pcp for appt.  Please follow up with wife and advise if appt he has on 11/05/17 can be hospital follow up appt or if he needs to see pcp before then.

## 2017-10-11 NOTE — Telephone Encounter (Signed)
SW Maudie Mercury this morning because she did pts TCM call and she stated that pts wife said that pts last bleeding episode was Saturday after discharge, none after that. Pt has TCM/HFU 10/20/17.

## 2017-10-11 NOTE — Telephone Encounter (Signed)
Doc of the Day Patient discharged from the hospital on 10/09/17 dx GI bleed. Hgb 8.9 upon discharge.  Calls because he is feeling more tired than during the hospitalization. Denies any bloody stools or abdominal pain. "Just tired and washed out." No upcoming appointments. No planned blood work except as needed by "the kidney doctor." Appetite is "good."  Concern is feeling more tired. Please advise.

## 2017-10-11 NOTE — Telephone Encounter (Signed)
He saw Dr. Silverio Decamp during hospitalization recently Would have him return for CBC and iron studies (ferritin and IBC panel) He should have followup office visit scheduled

## 2017-10-11 NOTE — Telephone Encounter (Signed)
Noted, TCM nurse made phone contact 10/09/17. TCM office f/u scheduled for 10/20/17.

## 2017-10-11 NOTE — Telephone Encounter (Signed)
PLEASE NOTE: All timestamps contained within this report are represented as [place] Standard Time. CONFIDENTIALTY NOTICE: This fax transmission is intended only for the addressee. It contains information that is legally privileged, confidential or otherwise protected from use or disclosure. If you are not the intended recipient, you are strictly prohibited from reviewing, disclosing, copying using or disseminating any of this information or taking any action in reliance on or regarding this information. If you have received this fax in error, please notify us immediately by telephone so that we can arrange for its return to [place]. Phone: [phone], Toll-Free: [phone], Fax: [phone] Page: 1 of 2 Call Id: [id number] [place] Patient Name: Curtis Jimenez Gender: Male DOB: August 25, 1936 Age: 81 Y 10 M 1 D Return Phone Number: [phone] (Primary), [phone] (Secondary) Address: City/State/Zip: [place] [address] Client [facility] - Client Client Site [place] Night Physician Crissie Sickles - MD Contact Type Call Who Is Calling Patient / Member / Family / Caregiver Call Type Triage / Clinical Relationship To Patient Self Return Phone Number [phone] (Primary) Chief Complaint Rectal Bleeding Reason for Call Symptomatic / Request for Health Information Initial Comment Caller stated called on Wednesday night for GI bleeding and nurse advised to visit ER. Stated was admitted and discharged today and is now hemorrhaging again. Translation No Nurse Assessment Nurse: Denyse Amass, RN, Benjamine Mola Date/Time (Eastern Time): 10/09/2017 5:50:33 PM Confirm and document reason for call. If symptomatic, describe symptoms. ---Patient states he was discharged from the hospital today. He had rectal bleeding. He just got home 3-4 hours ago and he had  a large amount of bright red rectal bleeding in the toilet. States he feels weak. Does the patient have any new or worsening symptoms? ---Yes Will a triage be completed? ---Yes Related visit to physician within the last 2 weeks? ---Yes Does the PT have any chronic conditions? (i.e. diabetes, asthma, etc.) ---Yes List chronic conditions. ---Kidney transplant GI Bleeding CVA in December Gout HTN High Cholesterol Bipolar Is this a behavioral health or substance abuse call? ---No Guidelines Guideline Title Affirmed Question Affirmed Notes Nurse Date/Time (Eastern Time) Rectal Bleeding SEVERE rectal bleeding (large blood clots; on and off, or constant bleeding) [place], RN, Benjamine Mola 10/09/2017 5:54:44 PM Disp. Time Eilene Ghazi Time) Disposition Final User 10/09/2017 5:56:08 PM Go to ED Now Yes [place], RN, Benjamine Mola PLEASE NOTE: All timestamps contained within this report are represented as [place] Standard Time. CONFIDENTIALTY NOTICE: This fax transmission is intended only for the addressee. It contains information that is legally privileged, confidential or otherwise protected from use or disclosure. If you are not the intended recipient, you are strictly prohibited from reviewing, disclosing, copying using or disseminating any of this information or taking any action in reliance on or regarding this information. If you have received this fax in error, please notify us immediately by telephone so that we can arrange for its return to [place]. Phone: [phone], Toll-Free: [phone], Fax: [phone] Page: 2 of 2 Call Id: [id number] [place] Disagree/Comply Comply Caller Understands Yes PreDisposition Go to ED Care Advice Given Per Guideline GO TO ED NOW: You need to be seen in the Emergency Department. Go to the ER at ___________ [place] now. Drive carefully. DRIVING: Another adult should drive. Do not delay going to the Emergency Department. If immediate transportation  is not available via car or taxi, then the patient should be instructed to call [id number].  BRING MEDICINES: * Please bring a list of your current medicines when you go to the Emergency Department (ER). * It is also a good idea to bring the pill bottles too. This will help the doctor to make certain you are taking the right medicines and the right dose. Referrals [facility] - ED

## 2017-10-11 NOTE — Telephone Encounter (Signed)
Noted--most recent bleeding episode was 10/09/17.  Signed:  Crissie Sickles, MD           10/11/2017

## 2017-10-11 NOTE — Telephone Encounter (Signed)
Spoke with patients wife, Lenna Sciara.   Transition Care Management Follow-up Telephone Call   Date discharged? 10/09/2017   How have you been since you were released from the hospital? "He's exhausted"   Do you understand why you were in the hospital? yes   Do you understand the discharge instructions? yes   Where were you discharged to? Home. Lives with wife.    Items Reviewed:  Medications reviewed: yes  Allergies reviewed: yes  Dietary changes reviewed: yes  Referrals reviewed: yes   Functional Questionnaire:   Activities of Daily Living (ADLs):   He states they are independent in the following: ambulation, bathing and hygiene, feeding, continence, grooming, toileting and dressing States they require assistance with the following: None.    Any transportation issues/concerns?: no   Any patient concerns? Yes. Wife reports patient continues to be pale, decreased energy. Also feels that his hand is swollen (arm with fistula). Last episode of hematochezia was Saturday, 10/09/17 after discharge.    Confirmed importance and date/time of follow-up visits scheduled yes  Provider Appointment booked with PCP on 10/20/17 @ 1:15 pm.   Confirmed with patient if condition begins to worsen call PCP or go to the ER.  Patient was given the office number and encouraged to call back with question or concerns.  : yes

## 2017-10-12 ENCOUNTER — Telehealth: Payer: Self-pay | Admitting: Gastroenterology

## 2017-10-12 ENCOUNTER — Other Ambulatory Visit (INDEPENDENT_AMBULATORY_CARE_PROVIDER_SITE_OTHER): Payer: Medicare HMO

## 2017-10-12 ENCOUNTER — Other Ambulatory Visit: Payer: Self-pay

## 2017-10-12 ENCOUNTER — Telehealth: Payer: Self-pay | Admitting: Family Medicine

## 2017-10-12 DIAGNOSIS — K922 Gastrointestinal hemorrhage, unspecified: Secondary | ICD-10-CM | POA: Diagnosis not present

## 2017-10-12 LAB — CBC WITH DIFFERENTIAL/PLATELET
BASOS ABS: 0.1 10*3/uL (ref 0.0–0.1)
Basophils Relative: 0.9 % (ref 0.0–3.0)
EOS ABS: 0.2 10*3/uL (ref 0.0–0.7)
Eosinophils Relative: 3 % (ref 0.0–5.0)
HEMATOCRIT: 28.7 % — AB (ref 39.0–52.0)
HEMOGLOBIN: 9.4 g/dL — AB (ref 13.0–17.0)
LYMPHS PCT: 17.8 % (ref 12.0–46.0)
Lymphs Abs: 1 10*3/uL (ref 0.7–4.0)
MCHC: 32.8 g/dL (ref 30.0–36.0)
MCV: 88.5 fl (ref 78.0–100.0)
MONOS PCT: 9.7 % (ref 3.0–12.0)
Monocytes Absolute: 0.6 10*3/uL (ref 0.1–1.0)
NEUTROS ABS: 4 10*3/uL (ref 1.4–7.7)
Neutrophils Relative %: 68.6 % (ref 43.0–77.0)
Platelets: 224 10*3/uL (ref 150.0–400.0)
RBC: 3.25 Mil/uL — AB (ref 4.22–5.81)
RDW: 14.5 % (ref 11.5–15.5)
WBC: 5.9 10*3/uL (ref 4.0–10.5)

## 2017-10-12 LAB — IRON,TIBC AND FERRITIN PANEL
%SAT: 10 % — AB (ref 15–60)
FERRITIN: 18 ng/mL — AB (ref 20–380)
Iron: 31 ug/dL — ABNORMAL LOW (ref 50–180)
TIBC: 314 ug/dL (ref 250–425)

## 2017-10-12 MED ORDER — HYDROCORTISONE ACETATE 25 MG RE SUPP: 25.0000 mg | Freq: Two times a day (BID) | RECTAL | 1 refills | Status: DC

## 2017-10-12 NOTE — Telephone Encounter (Signed)
Error

## 2017-10-12 NOTE — Telephone Encounter (Signed)
Spoke with the wife. The patient is aware of his CBC results. He is feeling a little better. He has eaten and had a nap today.

## 2017-10-12 NOTE — Telephone Encounter (Signed)
Patient's wife requesting lab work prior to appt.  I advised her I would have to ask Dr Anitra Lauth.  She said she would just go to GI since GI was willing to do labs.

## 2017-10-12 NOTE — Telephone Encounter (Signed)
Anusol rx had been sent to the pharmacy in error. Patient did not pick up the Rx. Call to the pharmacy and had the prescription cancelled and reversed on his insurance.

## 2017-10-12 NOTE — Telephone Encounter (Signed)
Spoke with the spouse. She had wanted to take the patient to his PCP, but was unable to get the okay through that office as quickly as she felt it was needed. She agrees to bring the patient to [facility] as suggested. She will bring him today.

## 2017-10-18 ENCOUNTER — Encounter: Payer: Medicare HMO | Admitting: Family Medicine

## 2017-10-19 ENCOUNTER — Ambulatory Visit (INDEPENDENT_AMBULATORY_CARE_PROVIDER_SITE_OTHER): Payer: Medicare HMO | Admitting: Physician Assistant

## 2017-10-19 ENCOUNTER — Encounter: Payer: Self-pay | Admitting: Physician Assistant

## 2017-10-19 VITALS — BP 104/58 | HR 76 | Ht 69.0 in | Wt 203.6 lb

## 2017-10-19 DIAGNOSIS — K5732 Diverticulitis of large intestine without perforation or abscess without bleeding: Secondary | ICD-10-CM | POA: Diagnosis not present

## 2017-10-19 DIAGNOSIS — Z8719 Personal history of other diseases of the digestive system: Secondary | ICD-10-CM

## 2017-10-19 DIAGNOSIS — D508 Other iron deficiency anemias: Secondary | ICD-10-CM

## 2017-10-19 DIAGNOSIS — Z940 Kidney transplant status: Secondary | ICD-10-CM | POA: Diagnosis not present

## 2017-10-19 NOTE — Patient Instructions (Addendum)
Please go to the basement level to have your labs drawn on Tuesday  11-02-2017. The lab hours are from 7:30 AM to 5:30 PM .  You may restart the Plavix today.   Call for any problems with bleeding or black stools.  We made you an appointment with Nicoletta Ba PA for Friday 11-26-2017 at 1:30 Pm. We made you an appointment with Dr. Silverio Decamp forWednesday 12-22-2017 at 1:45 PM.   If you are age 79 or older, your body mass index should be between 23-30. Your Body mass index is 30.07 kg/m. If this is out of the aforementioned range listed, please consider follow up with your Primary Care Provider.

## 2017-10-19 NOTE — Progress Notes (Signed)
Subjective:    Patient ID: Curtis Jimenez, male    DOB: May 24, 1936, 81 y.o.   MRN: [id number]  HPI Wallis is a 81 year old white male, who comes in today for post hospital follow-up. He was known previously to Dr. Sharlett Iles and was seen by Dr. Silverio Decamp during recent hospitalization for GI bleed. He was felt to have had a diverticular bleed, was admitted 1011 through 10/09/2017. He did not undergo endoscopic evaluation Hemoglobin dropped from 11.5 range to 8.8. He did not require transfusion. Patient has been on Plavix since December 2017 for a CVA and Plavix has been held since his hospitalization. [place] Patient had follow-up hemoglobin done outpatient on 10/12/2017 showed hemoglobin of 9.4 hematocrit of 28.7 serum iron 31 TIBC 314 iron saturation of 10 and ferritin of 18. Patient states that he had 1 episode of dark red blood per rectum the day after discharge from the hospital and then stool was black for a couple of days and over the past 5 days stool has been brown and normal appearing. His wife feels that he is still very fatigued. He has no complaints of abdominal discomfort. Last colonoscopy was done in July 2013 per Dr. Sharlett Iles pre-kidney transplant which showed severe diverticulosis in the left colon, no polyps. 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 Patient and wife relate that they do have follow-up with nephrology and Dr. Anitra Lauth both within the next week.  Review of Systems; Pertinent positive and negative review of systems were noted in the above HPI section.  All other review of systems was otherwise negative.  Outpatient Encounter Prescriptions as of 10/19/2017  Medication Sig  . acetaminophen (TYLENOL) 325 MG tablet Take 650 mg by mouth every 6 (six) hours as needed for mild pain.  Marland Kitchen allopurinol (ZYLOPRIM) 300 MG tablet Take 1 tablet (300  mg total) by mouth daily.  . carvedilol (COREG) 6.25 MG tablet Take 1 tablet (6.25 mg total) by mouth 2 (two) times daily with a meal. (Patient taking differently: Taking 2 tablets in the morning and 1 tablet in the evening)  . Cholecalciferol (VITAMIN D) 2000 UNITS CAPS Take 1 capsule by mouth daily.  . colchicine 0.6 MG tablet 2 tabs at onset of gout flare, then 1 tab one hour later, then starting the next day take 1 tab bid until gout flare is resolved  . ferrous sulfate 325 (65 FE) MG tablet Take 1 tablet (325 mg total) by mouth every other day.  . hydrOXYzine (ATARAX/VISTARIL) 10 MG tablet Take 10 mg by mouth 2 (two) times daily as needed.   Marland Kitchen levothyroxine (SYNTHROID, LEVOTHROID) 25 MCG tablet TAKE ONE TABLET BY MOUTH ONCE DAILY  . mycophenolate (MYFORTIC) 180 MG EC tablet Take 360 mg by mouth 2 (two) times daily.   . Omega-3 1000 MG CAPS Take 2,000 mg by mouth 2 (two) times daily.   Marland Kitchen omeprazole (PRILOSEC) 20 MG capsule Take 1 capsule (20 mg total) by mouth daily.  . risperiDONE (RISPERDAL) 2 MG tablet Take 2 mg by mouth at bedtime.  . simvastatin (ZOCOR) 20 MG tablet Take 20 mg by mouth daily.   Marland Kitchen sulfamethoxazole-trimethoprim (BACTRIM,SEPTRA) 400-80 MG tablet Take 1 tablet by mouth every Monday, Wednesday, and Friday.  . tacrolimus (PROGRAF) 0.5 MG capsule Take 0.5 mg by mouth at bedtime. Also taking 1mg  in the morning  . tacrolimus (PROGRAF) 1 MG capsule Take 1 mg by mouth  every morning. Also taking 0.5mg  at bedtime  . vitamin B-12 (CYANOCOBALAMIN) 100 MCG tablet Take 100 mcg by mouth daily.   No facility-administered encounter medications on file as of 10/19/2017.    Allergies  Allergen Reactions  . Amantadine Hcl Itching    REACTION: itching  . Chlorpromazine Hcl     REACTION: "fell flat on face"   Patient Active Problem List   Diagnosis Date Noted  . GIB (gastrointestinal bleeding) 10/07/2017  . IFG (impaired fasting glucose) 06/08/2017  . Stroke ([place]) 11/30/2016  .  Essential hypertension 01/16/2015  . S/p cadaver renal transplant 11/06/2012  . Chronic kidney disease (CKD), stage IV (severe) ([place]) 03/18/2012  . BENIGN PROSTATIC HYPERTROPHY 04/18/2008  . EXTRINSIC ASTHMA, UNSPECIFIED 12/19/2007  . PSA, INCREASED 12/09/2007  . Hypothyroidism 08/12/2007  . Bipolar disorder ([place]) 07/27/2007  . Hyperlipidemia 07/07/2007  . Gout 07/07/2007  . HYPERTENSION 07/07/2007  . GERD 07/07/2007   Social History   Social History  . Marital status: Married    Spouse name: N/A  . Number of children: N/A  . Years of education: N/A   Occupational History  . Not on file.   Social History Main Topics  . Smoking status: Former Smoker    Years: 16.00    Types: Pipe, Cigars    Quit date: 05/11/1974  . Smokeless tobacco: Never Used  . Alcohol use No  . Drug use: No  . Sexual activity: Not Currently    Partners: Female   Other Topics Concern  . Not on file   Social History Narrative   Married, 4 children, 9 GGC, [place].   Occupation: retired Marine scientist.  Originally from [place] [employer] [place].   Tobacco: quit 1975; smoked pipes and cigars x 15 yrs prior to this.   Alcohol: none.   Exercise: minimal, but is going to sign up for silver sneakers at the [facility].    Mr. Archey family history includes Bladder Cancer in his father; Heart disease in his father; Ulcers in his mother.      Objective:    Vitals:   10/19/17 1422  BP: (!) 104/58  Pulse: 76    Physical Exam ; well-developed elderly white male in no acute distress, accompanied by his wife blood pressure 104/58 pulse 76, weight 203, BMI 30.08 HEENT; nontraumatic normocephalic EOMI PERRLA sclera anicteric, Cardiovascular; regular rate and rhythm with S1-S2, Pulmonary ;clear bilaterally, Abdomen ;soft, nontender nondistended bowel sounds are active no palpable mass or hepatosplenomegaly, Rectal; exam not done, Extremities; no clubbing cyanosis or edema skin warm and dry, Neuropsych; mood and affect  appropriate       Assessment & Plan:   #61 81 year old white male status post hospitalization in about 2 weeks ago with acute GI bleed felt to be diverticular in etiology, self-limited, occurred while on Plavix. #2 acute on chronic anemia secondary to GI bleeding #3 new diagnosis of iron deficiency anemia-rule out secondary to component of chronic GI blood loss. 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 I discussed procedures with the patient who is very reluctant to proceed at this time. Will repeat CBC in 2 weeks He has just started iron supplementation per PCP ferrous sulfate 325 mg daily Patient and wife were  cautioned to watch for any signs of recurrent bleeding as he resumes Plavix and advised that if he has any overt melena or red blood that they should seek care through the emergency room as he is already anemic. Will schedule follow-up office visit with Dr. Silverio Decamp in about a month. Will need to have further discussion regarding colonoscopy and EGD. #Bipolar disorder   Jaelen Soth Genia Harold PA-C 10/19/2017   Cc: McGowen, Adrian Blackwater, MD

## 2017-10-20 ENCOUNTER — Encounter: Payer: Self-pay | Admitting: Family Medicine

## 2017-10-20 ENCOUNTER — Ambulatory Visit (INDEPENDENT_AMBULATORY_CARE_PROVIDER_SITE_OTHER): Payer: Medicare HMO | Admitting: Family Medicine

## 2017-10-20 VITALS — BP 93/51 | HR 80 | Temp 98.0°F | Resp 16 | Ht 69.0 in | Wt 204.5 lb

## 2017-10-20 DIAGNOSIS — I959 Hypotension, unspecified: Secondary | ICD-10-CM

## 2017-10-20 DIAGNOSIS — K922 Gastrointestinal hemorrhage, unspecified: Secondary | ICD-10-CM | POA: Diagnosis not present

## 2017-10-20 DIAGNOSIS — N184 Chronic kidney disease, stage 4 (severe): Secondary | ICD-10-CM

## 2017-10-20 DIAGNOSIS — D500 Iron deficiency anemia secondary to blood loss (chronic): Secondary | ICD-10-CM

## 2017-10-20 DIAGNOSIS — I10 Essential (primary) hypertension: Secondary | ICD-10-CM | POA: Diagnosis not present

## 2017-10-20 NOTE — Progress Notes (Signed)
Iron deficiency anemia likely post hemorrhagic after recent hospitalization with hematochezia thought to be secondary to diverticular hemorrhage. Reviewed and agree with documentation and assessment and plan.  Damaris Hippo , MD

## 2017-10-20 NOTE — Patient Instructions (Signed)
Increase your iron tab to DAILY for 1 week, then if you don't have any problems with this, increase it to TWICE a day.  Stop your carvedilol (coreg): restart it if your bp is consistently over 150/90.

## 2017-10-20 NOTE — Progress Notes (Signed)
10/20/2017  CC:  Chief Complaint  Patient presents with  . Hospitalization Follow-up    TCM    Patient is a 81 y.o. Caucasian male who presents for  hospital follow up, specifically Trans[facility] Services face-to-face visit. Dates hospitalized: 10/11-10/13, 2018. Days since d/c from hospital: 11 Patient was discharged from hospital to home. Reason for admission to hospital: GI bleeding, acute anemia. Date of interactive (phone) contact with patient and/or caregiver: 10/11/17  I have reviewed patient's discharge summary plus pertinent specific notes, labs, and imaging from the hospitalization.   Admitted for having painless hematochezia and dropping hemoglobin.  Bleeding was suspected to be coming from a bleeding diverticulum, and he did not undergo endoscopic evaluation in hospital. He did not require blood transfusion.  He had some BRBPR the day after d/c, then a few days of black stools, then his stool returned to normal brown color.  He was taken off his plavix while in hosp and told not to restart it at d/c home. He had GI follow up yesterday.  His iron testing shows significant deficiency, indicating a component of chronic GI blood loss underlying his recent acute GI bleeding.  GI recommended he restart his plavix, continue iron supplement--currently taking qod at direction of hospitalist--and that he get EGD and colonoscopy. Pt resistant to getting endoscopies, has not agreed to this yet.  He'll stay on iron and f/u with GI in 2 wks to discuss plan again.  Current status: generalized fatigue, legs get weak when walking.  No SOB with walking.  Wants to sleep a lot. No abd pain.  Stools are dark brown but no melena or hematochezia.  No CP, no n/v, no fevers, no cough, no dysuria, hematuria, nose bleeds, unusual bruising, no hemoptysis.  PO intake is fine. Says he went and got labs yesterday in prep for upcoming nephrologist f/u but he does not know any of the labs ordered. Says bp  at home has been 100s/60s.  He has continued to take his coreg.  Medication reconciliation was done today and patient is taking meds as recommended by discharging hospitalist/specialist.    PMH:  Past Medical History:  Diagnosis Date  . Anemia   . Arthritis   . Basal cell carcinoma    neck (skin MD 2X per year)  . Bipolar disorder ([place])   . BPH (benign prostatic hyperplasia)    with elevated PSA; followed by Dr. Rosana Hoes at [facility]  . Cataract   . Chronic renal insufficiency, stage III (moderate) (HCC)    Last f/u with neph 07/06/16: Cr stable at 1.57 (GFR 42)  . CVA (cerebral vascular accident) ([place]) 11/2016   Pontine (vertebrobasilar--imaging neg), TPA given.  Pt discharged on plavix.  Carotid dopplers ok, echocardiogram ok.  Residual deficit: vertigo but this improved greatly with therapy.  . Gallbladder polyp 2015   Asymptomatic  . GERD (gastroesophageal reflux disease)   . Gout   . Hyperlipidemia   . Hypertension   . Hypothyroidism   . Rectal bleeding 09/2017   Admitted for obs due to Hb drop.  GI consulted---obs recommended.  No transfusion required.  Plavix was d/c'd, ferrous sulfate recommended.  GI f/u arranged.  Marland Kitchen Restless legs syndrome   . 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 AV FISTULA PLACEMENT  04/08/2012   Procedure: ARTERIOVENOUS (AV) FISTULA  CREATION;  Surgeon: Angelia Mould, MD;  Location[facility] OR;  Service: Vascular;  Laterality: Left;  Creation of left brachial cephalic arteriovenous fistula  . COLONOSCOPY  2013   Normal except diverticulosis (recall 10 yrs)  . dilation for GERD    . KIDNEY TRANSPLANT  11/06/12   [place] (cadaveric)  . PROSTATE BIOPSY  2011   no malignancy  . TRANSTHORACIC ECHOCARDIOGRAM  11/2016   Normal LV systolic fxn, EF 71-69%.  Grade I DD.  Mild aortic root  dilatation, mild MV regurg.    MEDS:  Plavix 75 mg qd. Outpatient Medications Prior to Visit  Medication Sig Dispense Refill  . acetaminophen (TYLENOL) 325 MG tablet Take 650 mg by mouth every 6 (six) hours as needed for mild pain.    Marland Kitchen allopurinol (ZYLOPRIM) 300 MG tablet Take 1 tablet (300 mg total) by mouth daily. 90 tablet 3  . carvedilol (COREG) 6.25 MG tablet Take 1 tablet (6.25 mg total) by mouth 2 (two) times daily with a meal. (Patient taking differently: Taking 2 tablets in the morning and 1 tablet in the evening) 180 tablet 3  . Cholecalciferol (VITAMIN D) 2000 UNITS CAPS Take 1 capsule by mouth daily.    . colchicine 0.6 MG tablet 2 tabs at onset of gout flare, then 1 tab one hour later, then starting the next day take 1 tab bid until gout flare is resolved 20 tablet 1  . ferrous sulfate 325 (65 FE) MG tablet Take 1 tablet (325 mg total) by mouth every other day. 30 tablet 0  . hydrOXYzine (ATARAX/VISTARIL) 10 MG tablet Take 10 mg by mouth 2 (two) times daily as needed.     Marland Kitchen levothyroxine (SYNTHROID, LEVOTHROID) 25 MCG tablet TAKE ONE TABLET BY MOUTH ONCE DAILY 90 tablet 3  . mycophenolate (MYFORTIC) 180 MG EC tablet Take 360 mg by mouth 2 (two) times daily.     . Omega-3 1000 MG CAPS Take 2,000 mg by mouth 2 (two) times daily.     Marland Kitchen omeprazole (PRILOSEC) 20 MG capsule Take 1 capsule (20 mg total) by mouth daily. 90 capsule 3  . risperiDONE (RISPERDAL) 2 MG tablet Take 2 mg by mouth at bedtime.    . simvastatin (ZOCOR) 20 MG tablet Take 20 mg by mouth daily.     Marland Kitchen sulfamethoxazole-trimethoprim (BACTRIM,SEPTRA) 400-80 MG tablet Take 1 tablet by mouth every Monday, Wednesday, and Friday.    . tacrolimus (PROGRAF) 0.5 MG capsule Take 0.5 mg by mouth at bedtime. Also taking 1mg  in the morning    . tacrolimus (PROGRAF) 1 MG capsule Take 1 mg by mouth every morning. Also taking 0.5mg  at bedtime    . vitamin B-12 (CYANOCOBALAMIN) 100 MCG tablet Take 100 mcg by mouth daily.     No  facility-administered medications prior to visit.    EXAM: BP (!) 93/51 (BP Location: Right Arm, Patient Position: Sitting, Cuff Size: Normal)   Pulse 80   Temp 98 F (36.7 C) (Oral)   Resp 16   Ht 5\' 9"  (1.753 m)   Wt 204 lb 8 oz (92.8 kg)   SpO2 96%   BMI 30.20 kg/m  Gen: alert, NAD, tired-appearing. AFFECT: pleasant, lucid thought and speech. CVE:LFYB: no injection, icteris, swelling, or exudate.  EOMI, PERRLA. Mouth: lips without lesion/swelling.  Oral mucosa pink and moist. Oropharynx without erythema, exudate, or swelling.  CV: RRR, no m/r/g.   LUNGS: CTA bilat, nonlabored resps, good aeration in all lung fields. Marland Kitchenabd1 EXT: no clubbing,  cyanosis, or edema.  Skin: no rash or jaundice.  Pertinent labs/imaging Lab Results  Component Value Date   TSH 1.07 01/28/2017   Lab Results  Component Value Date   WBC 5.9 10/12/2017   HGB 9.4 (L) 10/12/2017   HCT 28.7 (L) 10/12/2017   MCV 88.5 10/12/2017   PLT 224.0 10/12/2017   Lab Results  Component Value Date   CREATININE 1.67 (H) 10/09/2017   BUN 22 (H) 10/09/2017   NA 141 10/09/2017   K 3.9 10/09/2017   CL 112 (H) 10/09/2017   CO2 22 10/09/2017   Lab Results  Component Value Date   ALT 10 (L) 10/08/2017   AST 13 (L) 10/08/2017   ALKPHOS 36 (L) 10/08/2017   BILITOT 0.7 10/08/2017   Lab Results  Component Value Date   IRON 31 (L) 10/12/2017   TIBC 314 10/12/2017   FERRITIN 18 (L) 10/12/2017    ASSESSMENT/PLAN:  Acute - on- chronic GI bleeding, with iron deficiency anemia. Diverticular bleed proposed to be the cause of recent hematochezia and Hb drop. No further hematochezia, no description of melena. Plan is to continue plavix as instructed by GI, take ferrous sulfate 325mg  DAILY x 7d, then try to increase to bid if qd dosing well tolerated.  Therapeutic expectations and side effect profile of medication discussed today.  Patient's questions answered. We'll recheck CBC and BMET today. After discussion of the  situation today, he is no longer against getting the EGD and colonoscopy done that [place] [facility] [place] [facility] recommends.   Given soft BPs, stop coreg for now.  Restart if bp consistently >150/90.  An After Visit Summary was printed and given to the patient.   Medical decision making of moderate complexity was utilized today.  FOLLOW UP:  Keep appt already scheduled with me for 11/05/17.  Signed:  Crissie Sickles, MD           10/20/2017

## 2017-10-21 ENCOUNTER — Encounter: Payer: Self-pay | Admitting: *Deleted

## 2017-10-21 ENCOUNTER — Encounter: Payer: Self-pay | Admitting: Family Medicine

## 2017-10-21 LAB — CBC WITH DIFFERENTIAL/PLATELET
BASOS ABS: 52 {cells}/uL (ref 0–200)
Basophils Relative: 1 %
EOS ABS: 151 {cells}/uL (ref 15–500)
Eosinophils Relative: 2.9 %
HEMATOCRIT: 29.5 % — AB (ref 38.5–50.0)
HEMOGLOBIN: 9.2 g/dL — AB (ref 13.2–17.1)
LYMPHS ABS: 650 {cells}/uL — AB (ref 850–3900)
MCH: 27.4 pg (ref 27.0–33.0)
MCHC: 31.2 g/dL — AB (ref 32.0–36.0)
MCV: 87.8 fL (ref 80.0–100.0)
MPV: 9.2 fL (ref 7.5–12.5)
Monocytes Relative: 9.2 %
NEUTROS ABS: 3869 {cells}/uL (ref 1500–7800)
NEUTROS PCT: 74.4 %
Platelets: 282 10*3/uL (ref 140–400)
RBC: 3.36 10*6/uL — ABNORMAL LOW (ref 4.20–5.80)
RDW: 13 % (ref 11.0–15.0)
Total Lymphocyte: 12.5 %
WBC mixed population: 478 cells/uL (ref 200–950)
WBC: 5.2 10*3/uL (ref 3.8–10.8)

## 2017-10-21 LAB — BASIC METABOLIC PANEL
BUN / CREAT RATIO: 11 (calc) (ref 6–22)
BUN: 23 mg/dL (ref 7–25)
CALCIUM: 8.7 mg/dL (ref 8.6–10.3)
CO2: 23 mmol/L (ref 20–32)
Chloride: 108 mmol/L (ref 98–110)
Creat: 2.05 mg/dL — ABNORMAL HIGH (ref 0.70–1.18)
Glucose, Bld: 97 mg/dL (ref 65–99)
Potassium: 4.8 mmol/L (ref 3.5–5.3)
SODIUM: 140 mmol/L (ref 135–146)

## 2017-10-25 ENCOUNTER — Telehealth: Payer: Self-pay | Admitting: Physician Assistant

## 2017-10-25 NOTE — Telephone Encounter (Signed)
Hgb is stable. He is back on his Plavix. His appointment with you is the end of the month and on 12/22/17 with Dr Silverio Decamp. Do I move his appointment up with you?

## 2017-10-25 NOTE — Telephone Encounter (Signed)
I had discussed Colon and EGD with him when I saw him, he was reluctant but after seeing PCP (who also sent me a note) is agreeable to procedures for iron deficiency anemia. He is on Plavix per Neurology - Dr Tomi Likens  I am ok with him being scheduled with Dr Silverio Decamp without coming back in for office visit - would need to send note to his neurologist about holding Plavix for 5 days prior to procedures, and come in to go over prep with you or Pam . Cannot send him for a pre-visit -they will punt him back to office because of plavix.Marland KitchenMarland Kitchen

## 2017-10-26 ENCOUNTER — Other Ambulatory Visit: Payer: Self-pay

## 2017-10-26 NOTE — Telephone Encounter (Signed)
Ok , great - let me know if I need to do anything else  Before procedures.Curtis Jimenez

## 2017-10-26 NOTE — Telephone Encounter (Signed)
Because you have discussed the procedures with him, I can get the clearance for holding Plavix. Pre-visit can still instruct him and have the consent forms signed.  I have left a message for the patient to call back. Dates held for pre-visit on 11/02/17 and procedure on 11/24/17. The message I left said procedure on 11/10/17, but it was taken before I could schedule.  Message about Plavix has been routed to Dr Tomi Likens.

## 2017-10-26 NOTE — Telephone Encounter (Signed)
Ok, thanks.

## 2017-11-02 ENCOUNTER — Ambulatory Visit (AMBULATORY_SURGERY_CENTER): Payer: Self-pay

## 2017-11-02 ENCOUNTER — Telehealth: Payer: Self-pay

## 2017-11-02 ENCOUNTER — Other Ambulatory Visit: Payer: Self-pay

## 2017-11-02 VITALS — Ht 69.0 in | Wt 200.6 lb

## 2017-11-02 DIAGNOSIS — D508 Other iron deficiency anemias: Secondary | ICD-10-CM

## 2017-11-02 MED ORDER — NA SULFATE-K SULFATE-MG SULF 17.5-3.13-1.6 GM/177ML PO SOLN: 1.0000 | Freq: Once | ORAL | 0 refills | Status: AC

## 2017-11-02 NOTE — Progress Notes (Signed)
Denies allergies to eggs or soy products. Denies complication of anesthesia or sedation. Denies use of weight loss medication. Denies use of O2.   Emmi instructions declined.  

## 2017-11-02 NOTE — Telephone Encounter (Signed)
Dr. Silverio Decamp,  I saw Curtis Jimenez MRN: [id number] in pre-visit today. Patient is scheduled for a colonoscopy on 11/24/17. Suprep was ordered for his bowel prep. This patient had a kidney transplant and the patient wanted to make sure that Suprep was safe to use since he has had a transplant? Please advise. Thanks.  Riki Sheer, LPN  (PV)

## 2017-11-03 ENCOUNTER — Encounter: Payer: Self-pay | Admitting: Family Medicine

## 2017-11-03 ENCOUNTER — Telehealth: Payer: Self-pay | Admitting: Physician Assistant

## 2017-11-03 NOTE — Telephone Encounter (Signed)
Letter faxed as requested

## 2017-11-03 NOTE — Telephone Encounter (Signed)
Patient is advised to hold his Plavix 5 days prior to colonoscopy. He will take the Plavix on 11/18/17. He will not take Plavix on 11/19/17 or the following days. He will resume as instructed following the procedure.

## 2017-11-04 DIAGNOSIS — Z940 Kidney transplant status: Secondary | ICD-10-CM | POA: Diagnosis not present

## 2017-11-04 DIAGNOSIS — K922 Gastrointestinal hemorrhage, unspecified: Secondary | ICD-10-CM | POA: Diagnosis not present

## 2017-11-04 DIAGNOSIS — N186 End stage renal disease: Secondary | ICD-10-CM | POA: Diagnosis not present

## 2017-11-04 DIAGNOSIS — I129 Hypertensive chronic kidney disease with stage 1 through stage 4 chronic kidney disease, or unspecified chronic kidney disease: Secondary | ICD-10-CM | POA: Diagnosis not present

## 2017-11-04 DIAGNOSIS — R739 Hyperglycemia, unspecified: Secondary | ICD-10-CM | POA: Diagnosis not present

## 2017-11-04 DIAGNOSIS — E669 Obesity, unspecified: Secondary | ICD-10-CM | POA: Diagnosis not present

## 2017-11-04 DIAGNOSIS — M7989 Other specified soft tissue disorders: Secondary | ICD-10-CM | POA: Diagnosis not present

## 2017-11-04 DIAGNOSIS — M109 Gout, unspecified: Secondary | ICD-10-CM | POA: Diagnosis not present

## 2017-11-04 NOTE — Progress Notes (Signed)
Subjective:   Curtis Jimenez is a 81 y.o. male who presents for Medicare Annual/Subsequent preventive examination.  Review of Systems:  No ROS.  Medicare Wellness Visit. Additional risk factors are reflected in the social history.  Cardiac Risk Factors include: advanced age (>44men, >106 women);dyslipidemia;hypertension;male gender;obesity (BMI >30kg/m2) Sleep patterns: Sleeps 10 hours.  Home Safety/Smoke Alarms: Feels safe in home. Smoke alarms in place.  Living environment; residence and Firearm Safety: Lives with wife in 1 story home.  Seat Belt Safety/Bike Helmet: Wears seat belt.    Male:   CCS-Colonoscopy 07/18/2012, diverticulosis. Scheduled for 11/24/2017   PSA-  Lab Results  Component Value Date   PSA 3.51 03/28/2008   PSA 4.83 (H) 12/02/2007       Objective:    Vitals: BP (!) 104/58 (BP Location: Right Arm, Patient Position: Sitting, Cuff Size: Normal)   Pulse 100   Temp 98 F (36.7 C) (Oral)   Resp 16   Ht 5\' 9"  (1.753 m)   Wt 198 lb (89.8 kg)   SpO2 94%   BMI 29.24 kg/m   Body mass index is 29.24 kg/m.  Tobacco Social History   Tobacco Use  Smoking Status Former Smoker  . Years: 16.00  . Types: Pipe, Cigars  . Last attempt to quit: 05/11/1974  . Years since quitting: 43.5  Smokeless Tobacco Never Used     Counseling given: Not Answered   Past Medical History:  Diagnosis Date  . Anemia   . Arthritis   . Basal cell carcinoma    neck (skin MD 2X per year)  . Bipolar disorder ([place])   . BPH (benign prostatic hyperplasia)    with elevated PSA; followed by Dr. Rosana Hoes at [facility]  . Cataract   . Chronic renal insufficiency, stage III (moderate) (HCC)    Last f/u with neph 07/06/16: Cr stable at 1.57 (GFR 42)  . 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  Pt discharged on plavix.  Carotid dopplers ok, echocardiogram ok.  Residual deficit: vertigo but this improved greatly with  therapy.  . Gallbladder polyp 2015   Asymptomatic  . GERD (gastroesophageal reflux disease)   . Gout   . Hyperlipidemia   . Hypertension   . Hypothyroidism   . Rectal bleeding 09/2017   Admitted for obs due to Hb drop.  GI consulted---obs recommended.  No transfusion required.  Plavix was d/c'd, ferrous sulfate recommended.  Outpt GI f/u--plavix restarted and upper/lower endo's recommended.  Marland Kitchen Restless legs syndrome   . 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 COLONOSCOPY  2013   Normal except diverticulosis (recall 10 yrs)  . dilation for GERD    . KIDNEY TRANSPLANT  11/06/12   [facility] (cadaveric)  . PROSTATE BIOPSY  2011   no malignancy  . TRANSTHORACIC ECHOCARDIOGRAM  11/2016   Normal LV systolic fxn, EF 09-73%.  Grade I DD.  Mild aortic root dilatation, mild MV regurg.   Family History  Problem Relation Age of Onset  . Ulcers Mother        GI bleed   . Heart disease Father   . Bladder Cancer Father   . Colon cancer Neg Hx   . Esophageal cancer Neg Hx   . Pancreatic cancer Neg Hx   . Rectal cancer Neg Hx   .  Stomach cancer Neg Hx    Social History   Substance and Sexual Activity  Sexual Activity Not Currently  . Partners: Female    Outpatient Encounter Medications as of 11/05/2017  Medication Sig  . acetaminophen (TYLENOL) 325 MG tablet Take 650 mg by mouth every 6 (six) hours as needed for mild pain.  Marland Kitchen allopurinol (ZYLOPRIM) 300 MG tablet Take 1 tablet (300 mg total) by mouth daily.  . Cholecalciferol (VITAMIN D) 2000 UNITS CAPS Take 1 capsule by mouth daily.  . clopidogrel (PLAVIX) 75 MG tablet Take 1 tablet by mouth daily.  . colchicine 0.6 MG tablet 2 tabs at onset of gout flare, then 1 tab one hour later, then starting the next day take 1 tab bid until gout flare is resolved  . ferrous sulfate 325 (65 FE) MG tablet  Take 1 tablet (325 mg total) by mouth every other day. (Patient taking differently: Take 325 mg daily by mouth. )  . hydrOXYzine (ATARAX/VISTARIL) 10 MG tablet Take 10 mg by mouth 2 (two) times daily as needed.   Marland Kitchen levothyroxine (SYNTHROID, LEVOTHROID) 25 MCG tablet TAKE ONE TABLET BY MOUTH ONCE DAILY  . mycophenolate (MYFORTIC) 180 MG EC tablet Take 360 mg by mouth 2 (two) times daily.   . Omega-3 1000 MG CAPS Take 2,000 mg by mouth 2 (two) times daily.   Marland Kitchen omeprazole (PRILOSEC) 20 MG capsule Take 1 capsule (20 mg total) by mouth daily.  . risperiDONE (RISPERDAL) 2 MG tablet Take 2 mg by mouth at bedtime.  . simvastatin (ZOCOR) 20 MG tablet Take 20 mg by mouth daily.   Marland Kitchen sulfamethoxazole-trimethoprim (BACTRIM,SEPTRA) 400-80 MG tablet Take 1 tablet by mouth every Monday, Wednesday, and Friday.  . tacrolimus (PROGRAF) 0.5 MG capsule Take 0.5 mg by mouth at bedtime. Also taking 1mg  in the morning  . tacrolimus (PROGRAF) 1 MG capsule Take 1 mg by mouth every morning. Also taking 0.5mg  at bedtime  . vitamin B-12 (CYANOCOBALAMIN) 100 MCG tablet Take 100 mcg by mouth daily.  Marland Kitchen Zoster Vaccine Adjuvanted [facility]) injection Inject 0.5 mLs once for 1 dose into the muscle. Repeat in 2-6 months   No facility-administered encounter medications on file as of 11/05/2017.     Activities of Daily Living In your present state of health, do you have any difficulty performing the following activities: 11/05/2017 10/07/2017  Hearing? N N  Vision? N N  Difficulty concentrating or making decisions? N N  Walking or climbing stairs? N N  Dressing or bathing? N N  Doing errands, shopping? N N  Preparing Food and eating ? N -  Using the Toilet? N -  In the past six months, have you accidently leaked urine? N -  Do you have problems with loss of bowel control? N -  Managing your Medications? N -  Managing your Finances? N -  Housekeeping or managing your Housekeeping? N -  Some recent data might be hidden     Patient Care Team: Tammi Sou, MD as PCP - General (Family Medicine) Cottle, Billey Co., MD as Consulting Physician (Psychiatry) Angelia Mould, MD as Consulting Physician (Vascular Surgery) Ralene Bathe, MD as Consulting Physician (Ophthalmology) Pieter Partridge, DO as Consulting Physician (Neurology) Mauri Pole, MD as Consulting Physician (Gastroenterology) Corliss Parish, MD as Consulting Physician (Nephrology)   Assessment:    Physical assessment deferred to PCP.  Exercise Activities and Dietary recommendations Current Exercise Habits: Home exercise routine, Type of exercise: Other - see comments(thai chi),  Time (Minutes): 45, Frequency (Times/Week): 1, Weekly Exercise (Minutes/Week): 45, Exercise limited by: None identified Diet (meal preparation, eat out, water intake, caffeinated beverages, dairy products, fruits and vegetables): Drinks water.   Eats heart healthy diet.   Goals    . Weight (lb) < 198 lb (89.8 kg)     Lose weight by controlling diet.       Fall Risk Fall Risk  11/05/2017 11/05/2017 07/21/2017 02/17/2017 04/29/2016  Falls in the past year? No Yes Yes Yes No  Number falls in past yr: - 2 or more 1 2 or more -  Injury with Fall? - No No No -  Risk Factor Category  - - - High Fall Risk -  Risk for fall due to : - - Other (Comment) - -  Follow up - - Falls evaluation completed;Education provided;Falls prevention discussed Falls evaluation completed -   Depression Screen PHQ 2/9 Scores 11/05/2017 11/05/2017 04/29/2016 01/31/2016  PHQ - 2 Score 0 0 0 0    Cognitive Function       Ad8 score reviewed for issues:  Issues making decisions: no  Less interest in hobbies / activities: no  Repeats questions, stories (family complaining): no  Trouble using ordinary gadgets (microwave, computer, phone): no  Forgets the month or year: no  Mismanaging finances: no  Remembering appts:no  Daily problems with thinking and/or  memory: no Ad8 score is=0     Immunization History  Administered Date(s) Administered  . Hepatitis A, Adult 08/14/2016, 05/31/2017  . Hepatitis B 02/15/2012, 03/14/2012, 08/15/2012  . Influenza Split 09/27/2012  . Influenza Whole 12/16/2009, 09/18/2010, 09/28/2011  . Influenza, High Dose Seasonal PF 11/04/2016, 11/05/2017  . Influenza-Unspecified 09/27/2013  . PPD Test 06/27/2012  . Pneumococcal Conjugate-13 07/19/2015  . Pneumococcal Polysaccharide-23 12/29/2003  . Td 12/29/2003  . Tdap 04/27/2014  . Typhoid Inactivated 08/14/2016  . Zoster 04/18/2008   Screening Tests Health Maintenance  Topic Date Due  . INFLUENZA VACCINE  07/28/2017  . TETANUS/TDAP  04/27/2024  . PNA vac Low Risk Adult  Completed   Patient has prescription for Shingrix, plans to postpone until 12/2017.     Plan:    Bring a copy of your living will and/or healthcare power of attorney to your next office visit.  Continue doing brain stimulating activities (puzzles, reading, adult coloring books, staying active) to keep memory sharp.   I have personally reviewed and noted the following in the patient's chart:   . Medical and social history . Use of alcohol, tobacco or illicit drugs  . Current medications and supplements . Functional ability and status . Nutritional status . Physical activity . Advanced directives . List of other physicians . Hospitalizations, surgeries, and ER visits in previous 12 months . Vitals . Screenings to include cognitive, depression, and falls . Referrals and appointments  In addition, I have reviewed and discussed with patient certain preventive protocols, quality metrics, and best practice recommendations. A written personalized care plan for preventive services as well as general preventive health recommendations were provided to patient.     Gerilyn Nestle, RN  11/05/2017

## 2017-11-05 ENCOUNTER — Ambulatory Visit: Payer: Medicare HMO

## 2017-11-05 ENCOUNTER — Encounter: Payer: Self-pay | Admitting: Family Medicine

## 2017-11-05 ENCOUNTER — Other Ambulatory Visit: Payer: Self-pay

## 2017-11-05 ENCOUNTER — Ambulatory Visit (INDEPENDENT_AMBULATORY_CARE_PROVIDER_SITE_OTHER): Payer: Medicare HMO | Admitting: Family Medicine

## 2017-11-05 VITALS — BP 104/58 | HR 100 | Temp 98.0°F | Resp 16 | Ht 69.0 in | Wt 198.0 lb

## 2017-11-05 DIAGNOSIS — Z0000 Encounter for general adult medical examination without abnormal findings: Secondary | ICD-10-CM | POA: Diagnosis not present

## 2017-11-05 DIAGNOSIS — Z23 Encounter for immunization: Secondary | ICD-10-CM

## 2017-11-05 DIAGNOSIS — E7800 Pure hypercholesterolemia, unspecified: Secondary | ICD-10-CM | POA: Diagnosis not present

## 2017-11-05 DIAGNOSIS — I10 Essential (primary) hypertension: Secondary | ICD-10-CM | POA: Diagnosis not present

## 2017-11-05 DIAGNOSIS — D649 Anemia, unspecified: Secondary | ICD-10-CM

## 2017-11-05 DIAGNOSIS — Z125 Encounter for screening for malignant neoplasm of prostate: Secondary | ICD-10-CM | POA: Diagnosis not present

## 2017-11-05 DIAGNOSIS — Z1283 Encounter for screening for malignant neoplasm of skin: Secondary | ICD-10-CM | POA: Diagnosis not present

## 2017-11-05 DIAGNOSIS — E039 Hypothyroidism, unspecified: Secondary | ICD-10-CM

## 2017-11-05 MED ORDER — ZOSTER VAC RECOMB ADJUVANTED 50 MCG/0.5ML IM SUSR: 0.5000 mL | Freq: Once | INTRAMUSCULAR | 1 refills | Status: AC

## 2017-11-05 NOTE — Patient Instructions (Addendum)
Bring a copy of your living will and/or healthcare power of attorney to your next office visit.  Continue doing brain stimulating activities (puzzles, reading, adult coloring books, staying active) to keep memory sharp.    Health Maintenance, Male A healthy lifestyle and preventive care is important for your health and wellness. Ask your health care provider about what schedule of regular examinations is right for you. What should I know about weight and diet? Eat a Healthy Diet  Eat plenty of vegetables, fruits, whole grains, low-fat dairy products, and lean protein.  Do not eat a lot of foods high in solid fats, added sugars, or salt.  Maintain a Healthy Weight Regular exercise can help you achieve or maintain a healthy weight. You should:  Do at least 150 minutes of exercise each week. The exercise should increase your heart rate and make you sweat (moderate-intensity exercise).  Do strength-training exercises at least twice a week.  Watch Your Levels of Cholesterol and Blood Lipids  Have your blood tested for lipids and cholesterol every 5 years starting at 79 years of age. If you are at high risk for heart disease, you should start having your blood tested when you are 79 years old. You may need to have your cholesterol levels checked more often if: ? Your lipid or cholesterol levels are high. ? You are older than 79 years of age. ? You are at high risk for heart disease.  What should I know about cancer screening? Many types of cancers can be detected early and may often be prevented. Lung Cancer  You should be screened every year for lung cancer if: ? You are a current smoker who has smoked for at least 30 years. ? You are a former smoker who has quit within the past 15 years.  Talk to your health care provider about your screening options, when you should start screening, and how often you should be screened.  Colorectal Cancer  Routine colorectal cancer screening  usually begins at 79 years of age and should be repeated every 5-10 years until you are 79 years old. You may need to be screened more often if early forms of precancerous polyps or small growths are found. Your health care provider may recommend screening at an earlier age if you have risk factors for colon cancer.  Your health care provider may recommend using home test kits to check for hidden blood in the stool.  A small camera at the end of a tube can be used to examine your colon (sigmoidoscopy or colonoscopy). This checks for the earliest forms of colorectal cancer.  Prostate and Testicular Cancer  Depending on your age and overall health, your health care provider may do certain tests to screen for prostate and testicular cancer.  Talk to your health care provider about any symptoms or concerns you have about testicular or prostate cancer.  Skin Cancer  Check your skin from head to toe regularly.  Tell your health care provider about any new moles or changes in moles, especially if: ? There is a change in a mole's size, shape, or color. ? You have a mole that is larger than a pencil eraser.  Always use sunscreen. Apply sunscreen liberally and repeat throughout the day.  Protect yourself by wearing long sleeves, pants, a wide-brimmed hat, and sunglasses when outside.  What should I know about heart disease, diabetes, and high blood pressure?  If you are 18-39 years of age, have your blood pressure checked every   3-5 years. If you are 40 years of age or older, have your blood pressure checked every year. You should have your blood pressure measured twice-once when you are at a hospital or clinic, and once when you are not at a hospital or clinic. Record the average of the two measurements. To check your blood pressure when you are not at a hospital or clinic, you can use: ? An automated blood pressure machine at a pharmacy. ? A home blood pressure monitor.  Talk to your health care  provider about your target blood pressure.  If you are between 45-79 years old, ask your health care provider if you should take aspirin to prevent heart disease.  Have regular diabetes screenings by checking your fasting blood sugar level. ? If you are at a normal weight and have a low risk for diabetes, have this test once every three years after the age of 45. ? If you are overweight and have a high risk for diabetes, consider being tested at a younger age or more often.  A one-time screening for abdominal aortic aneurysm (AAA) by ultrasound is recommended for men aged 65-75 years who are current or former smokers. What should I know about preventing infection? Hepatitis B If you have a higher risk for hepatitis B, you should be screened for this virus. Talk with your health care provider to find out if you are at risk for hepatitis B infection. Hepatitis C Blood testing is recommended for:  Everyone born from 1945 through 1965.  Anyone with known risk factors for hepatitis C.  Sexually Transmitted Diseases (STDs)  You should be screened each year for STDs including gonorrhea and chlamydia if: ? You are sexually active and are younger than 79 years of age. ? You are older than 79 years of age and your health care provider tells you that you are at risk for this type of infection. ? Your sexual activity has changed since you were last screened and you are at an increased risk for chlamydia or gonorrhea. Ask your health care provider if you are at risk.  Talk with your health care provider about whether you are at high risk of being infected with HIV. Your health care provider may recommend a prescription medicine to help prevent HIV infection.  What else can I do?  Schedule regular health, dental, and eye exams.  Stay current with your vaccines (immunizations).  Do not use any tobacco products, such as cigarettes, chewing tobacco, and e-cigarettes. If you need help quitting, ask  your health care provider.  Limit alcohol intake to no more than 2 drinks per day. One drink equals 12 ounces of beer, 5 ounces of wine, or 1 ounces of hard liquor.  Do not use street drugs.  Do not share needles.  Ask your health care provider for help if you need support or information about quitting drugs.  Tell your health care provider if you often feel depressed.  Tell your health care provider if you have ever been abused or do not feel safe at home. This information is not intended to replace advice given to you by your health care provider. Make sure you discuss any questions you have with your health care provider. Document Released: 06/11/2008 Document Revised: 08/12/2016 Document Reviewed: 09/17/2015 Elsevier Interactive Patient Education  2018 [employer]  

## 2017-11-05 NOTE — Progress Notes (Addendum)
Office Note 11/05/2017  CC:  Chief Complaint  Patient presents with  . Annual Exam    Pt is fasting.     HPI:  Curtis Jimenez is a 81 y.o. White male who is here for annual health maintenance exam.  Home bp's reviewed today: since stopping coreg 10/20/17 his avg syst has been about 140, avg diast about 75, avg HR about 85.   BP here today is 104/58.  Recent rectal bleeding hospitalization: denies any recent rectal bleeding.  His stools are formed, very dark but not melanotic.  He is taking iron PO. Outpt endoscopies scheduled. Will repeat cBC today.  Exercise: none Diet: not working on anything. Eyes: exams regular--small cataracts.   Past Medical History:  Diagnosis Date  . Anemia   . Arthritis   . Basal cell carcinoma    neck (skin MD 2X per year)  . Bipolar disorder ([place])   . BPH (benign prostatic hyperplasia)    with elevated PSA; followed by Dr. Rosana Hoes at [facility]  . Cataract   . Chronic renal insufficiency, stage III (moderate) (HCC)    Last f/u with neph 07/06/16: Cr stable at 1.57 (GFR 42)  . 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  Pt discharged on plavix.  Carotid dopplers ok, echocardiogram ok.  Residual deficit: vertigo but this improved greatly with therapy.  . Gallbladder polyp 2015   Asymptomatic  . GERD (gastroesophageal reflux disease)   . Gout   . Hyperlipidemia   . Hypertension   . Hypothyroidism   . Rectal bleeding 09/2017   Admitted for obs due to Hb drop.  GI consulted---obs recommended.  No transfusion required.  Plavix was d/c'd, ferrous sulfate recommended.  Outpt GI f/u--plavix restarted and upper/lower endo's recommended.  Marland Kitchen Restless legs syndrome   . 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 COLONOSCOPY  2013   Normal  except diverticulosis (recall 10 yrs)  . dilation for GERD    . KIDNEY TRANSPLANT  11/06/12   [place] (cadaveric)  . PROSTATE BIOPSY  2011   no malignancy  . TRANSTHORACIC ECHOCARDIOGRAM  11/2016   Normal LV systolic fxn, EF 52-77%.  Grade I DD.  Mild aortic root dilatation, mild MV regurg.    Family History  Problem Relation Age of Onset  . Ulcers Mother        GI bleed   . Heart disease Father   . Bladder Cancer Father   . Colon cancer Neg Hx   . Esophageal cancer Neg Hx   . Pancreatic cancer Neg Hx   . Rectal cancer Neg Hx   . Stomach cancer Neg Hx     Social History   Socioeconomic History  . Marital status: Married    Spouse name: Not on file  . Number of children: Not on file  . Years of education: Not on file  . Highest education level: Not on file  Social Needs  . Financial resource strain: Not on file  . Food insecurity - worry: Not on file  . Food insecurity - inability: Not on file  . Transportation needs - medical: Not on file  . Transportation needs - non-medical: Not on file  Occupational History  . Not on file  Tobacco Use  . Smoking status: Former Smoker    Years:  16.00    Types: Pipe, Cigars    Last attempt to quit: 05/11/1974    Years since quitting: 43.5  . Smokeless tobacco: Never Used  Substance and Sexual Activity  . Alcohol use: No    Alcohol/week: 0.0 oz  . Drug use: No  . Sexual activity: Not Currently    Partners: Female  Other Topics Concern  . Not on file  Social History Narrative   Married, 4 children, 9 GGC, [place].   Occupation: retired Marine scientist.  Originally from [place].   Tobacco: quit 1975; smoked pipes and cigars x 15 yrs prior to this.   Alcohol: none.   Exercise: minimal, but is going to sign up for silver sneakers at the [facility].    Outpatient Medications Prior to Visit  Medication Sig Dispense Refill  . acetaminophen (TYLENOL) 325 MG tablet Take 650 mg by mouth every 6 (six) hours as needed for mild pain.    Marland Kitchen  allopurinol (ZYLOPRIM) 300 MG tablet Take 1 tablet (300 mg total) by mouth daily. 90 tablet 3  . Cholecalciferol (VITAMIN D) 2000 UNITS CAPS Take 1 capsule by mouth daily.    . clopidogrel (PLAVIX) 75 MG tablet Take 1 tablet by mouth daily.    . colchicine 0.6 MG tablet 2 tabs at onset of gout flare, then 1 tab one hour later, then starting the next day take 1 tab bid until gout flare is resolved 20 tablet 1  . ferrous sulfate 325 (65 FE) MG tablet Take 1 tablet (325 mg total) by mouth every other day. (Patient taking differently: Take 325 mg daily by mouth. ) 30 tablet 0  . hydrOXYzine (ATARAX/VISTARIL) 10 MG tablet Take 10 mg by mouth 2 (two) times daily as needed.     Marland Kitchen levothyroxine (SYNTHROID, LEVOTHROID) 25 MCG tablet TAKE ONE TABLET BY MOUTH ONCE DAILY 90 tablet 3  . mycophenolate (MYFORTIC) 180 MG EC tablet Take 360 mg by mouth 2 (two) times daily.     . Omega-3 1000 MG CAPS Take 2,000 mg by mouth 2 (two) times daily.     Marland Kitchen omeprazole (PRILOSEC) 20 MG capsule Take 1 capsule (20 mg total) by mouth daily. 90 capsule 3  . risperiDONE (RISPERDAL) 2 MG tablet Take 2 mg by mouth at bedtime.    . simvastatin (ZOCOR) 20 MG tablet Take 20 mg by mouth daily.     Marland Kitchen sulfamethoxazole-trimethoprim (BACTRIM,SEPTRA) 400-80 MG tablet Take 1 tablet by mouth every Monday, Wednesday, and Friday.    . tacrolimus (PROGRAF) 0.5 MG capsule Take 0.5 mg by mouth at bedtime. Also taking 1mg  in the morning    . tacrolimus (PROGRAF) 1 MG capsule Take 1 mg by mouth every morning. Also taking 0.5mg  at bedtime    . vitamin B-12 (CYANOCOBALAMIN) 100 MCG tablet Take 100 mcg by mouth daily.     No facility-administered medications prior to visit.     Allergies  Allergen Reactions  . Amantadine Hcl Itching    REACTION: itching  . Chlorpromazine Hcl     REACTION: "fell flat on face"    ROS Review of Systems  Constitutional: Positive for fatigue. Negative for appetite change, chills and fever.  HENT: Negative for  congestion, dental problem, ear pain and sore throat.   Eyes: Negative for discharge, redness and visual disturbance.  Respiratory: Negative for cough, chest tightness, shortness of breath and wheezing.   Cardiovascular: Negative for chest pain, palpitations and leg swelling.  Gastrointestinal: Negative for abdominal pain, blood in stool,  diarrhea, nausea and vomiting.  Genitourinary: Negative for difficulty urinating, dysuria, flank pain, frequency, hematuria and urgency.  Musculoskeletal: Negative for arthralgias, back pain, joint swelling, myalgias and neck stiffness.  Skin: Negative for pallor and rash.  Neurological: Negative for dizziness, speech difficulty, weakness and headaches.  Hematological: Negative for adenopathy. Does not bruise/bleed easily.  Psychiatric/Behavioral: Negative for confusion and sleep disturbance. The patient is not nervous/anxious.     PE; Blood pressure (!) 104/58, pulse 100, temperature 98 F (36.7 C), temperature source Oral, resp. rate 16, height 5\' 9"  (1.753 m), weight 198 lb (89.8 kg), SpO2 94 %. Gen: Alert, well appearing.  Patient is oriented to person, place, time, and situation. AFFECT: pleasant, lucid thought and speech. ENT: Ears: EACs clear, normal epithelium.  TMs with good light reflex and landmarks bilaterally.  Eyes: no injection, icteris, swelling, or exudate.  EOMI, PERRLA. Nose: no drainage or turbinate edema/swelling.  No injection or focal lesion.  Mouth: lips without lesion/swelling.  Oral mucosa pink and moist.  Dentition intact and without obvious caries or gingival swelling.  Oropharynx without erythema, exudate, or swelling.  Neck: supple/nontender.  No LAD, mass, or TM.  Carotid pulses 2+ bilaterally, without bruits. CV: RRR, no m/r/g.   LUNGS: CTA bilat, nonlabored resps, good aeration in all lung fields. ABD: soft, NT, ND, BS normal.  No hepatospenomegaly or mass.  No bruits. EXT: no clubbing, cyanosis, or edema.  Musculoskeletal:  no joint swelling, erythema, warmth, or tenderness.  ROM of all joints intact. Skin - no sores or suspicious lesions or rashes or color changes Rectal exam: negative without mass, lesions or tenderness, PROSTATE EXAM: smooth and symmetric without nodules or tenderness. Neuro: CN 2-12 intact bilaterally, strength 5/5 in proximal and distal upper extremities and lower extremities bilaterally.  No sensory deficits.  No tremor.  No disdiadochokinesis.  No ataxia.  Upper extremity and lower extremity DTRs symmetric.  No pronator drift.  Pertinent labs:  Lab Results  Component Value Date   TSH 1.07 01/28/2017   Lab Results  Component Value Date   WBC 5.2 10/20/2017   HGB 9.2 (L) 10/20/2017   HCT 29.5 (L) 10/20/2017   MCV 87.8 10/20/2017   PLT 282 10/20/2017   Lab Results  Component Value Date   CREATININE 2.05 (H) 10/20/2017   BUN 23 10/20/2017   NA 140 10/20/2017   K 4.8 10/20/2017   CL 108 10/20/2017   CO2 23 10/20/2017   Lab Results  Component Value Date   ALT 10 (L) 10/08/2017   AST 13 (L) 10/08/2017   ALKPHOS 36 (L) 10/08/2017   BILITOT 0.7 10/08/2017   Lab Results  Component Value Date   CHOL 120 12/01/2016   Lab Results  Component Value Date   HDL 42 12/01/2016   Lab Results  Component Value Date   LDLCALC 65 12/01/2016   Lab Results  Component Value Date   TRIG 63 12/01/2016   Lab Results  Component Value Date   CHOLHDL 2.9 12/01/2016   Lab Results  Component Value Date   PSA 3.51 03/28/2008   PSA 4.83 (H) 12/02/2007   Lab Results  Component Value Date   HGBA1C 5.7 (H) 05/31/2017    ASSESSMENT AND PLAN:   1) HTN: bp ok, with occ low (like today's).  I am hesitant to restart his coreg at this time. Continue home bp/hr monitoring daily and I'll see him back in 6 weeks for this. Lytes/cr recently stable about 3 wks ago.  2)  Health maintenance exam: Reviewed age and gender appropriate health maintenance issues (prudent diet, regular exercise,  health risks of tobacco and excessive alcohol, use of seatbelts, fire alarms in home, use of sunscreen).  Also reviewed age and gender appropriate health screening as well as vaccine recommendations. Vaccines: Flu vaccine today.  O/w UTD.  Shingrix rx given today. Labs: FLP, PSA, and TSH today. Prostate ca screening: DRE normal today, PSA today.  He used to see a urologist but chose to stop going a few years ago per his report today. Colon ca screening: next colonoscopy being done 11/25/17 for recent GI bleeding.  3) Recent GI bleeding.  Suspected to be from diverticular bleed (on plavix).  Plavix was restarted by GI at recent outpt f/u.  Taking oral iron, has outpt endoscopies scheduled at end of this month--has iron deficiency, so they are looking for anything that would be giving CHRONIC GI bleeding. No further bleeding noted at this time.   An After Visit Summary was printed and given to the patient.  FOLLOW UP:  Return in about 6 weeks (around 12/17/2017) for f/u BP.  Signed:  Crissie Sickles, MD           11/05/2017

## 2017-11-05 NOTE — Progress Notes (Signed)
AWV reviewed and agree.  Signed:  Crissie Sickles, MD           11/05/2017

## 2017-11-06 LAB — CBC
HEMATOCRIT: 29.2 % — AB (ref 38.5–50.0)
HEMOGLOBIN: 9.2 g/dL — AB (ref 13.2–17.1)
MCH: 27.6 pg (ref 27.0–33.0)
MCHC: 31.5 g/dL — ABNORMAL LOW (ref 32.0–36.0)
MCV: 87.7 fL (ref 80.0–100.0)
MPV: 9.4 fL (ref 7.5–12.5)
Platelets: 286 10*3/uL (ref 140–400)
RBC: 3.33 10*6/uL — AB (ref 4.20–5.80)
RDW: 13.4 % (ref 11.0–15.0)
WBC: 5.5 10*3/uL (ref 3.8–10.8)

## 2017-11-06 LAB — LIPID PANEL
CHOLESTEROL: 108 mg/dL (ref <200)
HDL: 45 mg/dL (ref >40)
LDL CHOLESTEROL (CALC): 45 mg/dL
Non-HDL Cholesterol (Calc): 63 mg/dL (calc) (ref <130)
TRIGLYCERIDES: 94 mg/dL (ref <150)
Total CHOL/HDL Ratio: 2.4 (calc) (ref <5.0)

## 2017-11-06 LAB — TSH: TSH: 0.64 m[IU]/L (ref 0.40–4.50)

## 2017-11-06 LAB — PSA: PSA: 4.2 ng/mL — AB (ref <=4.0)

## 2017-11-10 ENCOUNTER — Encounter: Payer: Medicare HMO | Admitting: Gastroenterology

## 2017-11-10 ENCOUNTER — Encounter: Payer: Self-pay | Admitting: Family Medicine

## 2017-11-10 ENCOUNTER — Other Ambulatory Visit: Payer: Self-pay | Admitting: *Deleted

## 2017-11-10 MED ORDER — FERROUS SULFATE 325 (65 FE) MG PO TABS: 325.0000 mg | ORAL_TABLET | Freq: Every day | ORAL | 3 refills | Status: DC

## 2017-11-10 NOTE — Telephone Encounter (Signed)
Pt LMOM on 11/10/17 at 1:50pm stating that he needs a refill on his ferrous sulfate. He stated that this medication was started in the hospital. Please advise. Thanks.

## 2017-11-10 NOTE — Telephone Encounter (Signed)
OK, ferrous sulfate eRX'd

## 2017-11-11 ENCOUNTER — Encounter: Payer: Self-pay | Admitting: Gastroenterology

## 2017-11-11 NOTE — Telephone Encounter (Signed)
Pts wife advised and voiced understanding, okay per DPR. 

## 2017-11-12 ENCOUNTER — Other Ambulatory Visit: Payer: Self-pay | Admitting: *Deleted

## 2017-11-12 DIAGNOSIS — R972 Elevated prostate specific antigen [PSA]: Secondary | ICD-10-CM

## 2017-11-16 ENCOUNTER — Other Ambulatory Visit: Payer: Self-pay | Admitting: Family Medicine

## 2017-11-16 NOTE — Telephone Encounter (Signed)
Curtis Jimenez  RF request for allopurinol LOV: 11/05/17 Next ov: 12/10/17 Last written: 10/07/16 #90 w/ 3RF  Please advise. Thanks.

## 2017-11-22 DIAGNOSIS — Z940 Kidney transplant status: Secondary | ICD-10-CM | POA: Diagnosis not present

## 2017-11-24 ENCOUNTER — Encounter: Payer: Self-pay | Admitting: Gastroenterology

## 2017-11-24 ENCOUNTER — Ambulatory Visit (AMBULATORY_SURGERY_CENTER): Payer: Medicare HMO | Admitting: Gastroenterology

## 2017-11-24 VITALS — BP 135/61 | HR 70 | Temp 97.7°F | Resp 13 | Ht 69.0 in | Wt 200.0 lb

## 2017-11-24 DIAGNOSIS — D508 Other iron deficiency anemias: Secondary | ICD-10-CM

## 2017-11-24 DIAGNOSIS — D509 Iron deficiency anemia, unspecified: Secondary | ICD-10-CM | POA: Diagnosis not present

## 2017-11-24 DIAGNOSIS — D122 Benign neoplasm of ascending colon: Secondary | ICD-10-CM | POA: Diagnosis not present

## 2017-11-24 DIAGNOSIS — Z8673 Personal history of transient ischemic attack (TIA), and cerebral infarction without residual deficits: Secondary | ICD-10-CM | POA: Diagnosis not present

## 2017-11-24 HISTORY — PX: ESOPHAGOGASTRODUODENOSCOPY: SHX1529

## 2017-11-24 MED ORDER — SODIUM CHLORIDE 0.9 % IV SOLN: 500.0000 mL | INTRAVENOUS | Status: DC

## 2017-11-24 NOTE — Op Note (Signed)
Curtis Jimenez Name: Curtis Jimenez Procedure Date: 11/24/2017 3:00 PM MRN: [id number] Endoscopist: Mauri Pole , MD Age: 81 Referring MD:  Date of Birth: 1936/10/10 Gender: Male Account #: [id number] Procedure:                Colonoscopy Indications:              Evaluation of unexplained GI bleeding, Unexplained                            iron deficiency anemia Medicines:                Monitored Anesthesia Care Procedure:                Pre-Anesthesia Assessment:                           - Prior to the procedure, a History and Physical                            was performed, and Jimenez medications and                            allergies were reviewed. The Jimenez's tolerance of                            previous anesthesia was also reviewed. The risks                            and benefits of the procedure and the sedation                            options and risks were discussed with the Jimenez.                            All questions were answered, and informed consent                            was obtained. Prior Anticoagulants: The Jimenez                            last took Plavix (clopidogrel) 5 days prior to the                            procedure. ASA Grade Assessment: III - A Jimenez                            with severe systemic disease. After reviewing the                            risks and benefits, the Jimenez was deemed in                            satisfactory condition to undergo the procedure.  After obtaining informed consent, the colonoscope                            was passed under direct vision. Throughout the                            procedure, the Jimenez's blood pressure, pulse, and                            oxygen saturations were monitored continuously. The                            Model PCF-H190DL [id number]) scope was introduced                            through the anus and  advanced to the the cecum,                            identified by appendiceal orifice and ileocecal                            valve. The colonoscopy was performed without                            difficulty. The Jimenez tolerated the procedure                            well. The quality of the bowel preparation was                            good. The ileocecal valve, appendiceal orifice, and                            rectum were photographed. Scope In: 3:15:41 PM Scope Out: 3:44:39 PM Scope Withdrawal Time: 0 hours 12 minutes 23 seconds  Total Procedure Duration: 0 hours 28 minutes 58 seconds  Findings:                 The perianal and digital rectal examinations were                            normal.                           Multiple small and large-mouthed diverticula were                            found in the entire colon. There was narrowing of                            the colon in association with the diverticular                            opening. There was evidence of diverticular spasm.  Peri-diverticular erythema was seen. There was                            evidence of an impacted diverticulum. There was no                            evidence of diverticular bleeding.                           A 3 mm polyp was found in the ascending colon. The                            polyp was sessile. The polyp was removed with a                            cold biopsy forceps. Resection and retrieval were                            complete.                           A 7 mm polyp was found in the ascending colon. The                            polyp was sessile. The polyp was removed with a                            cold snare. Resection and retrieval were complete.                           Non-bleeding internal hemorrhoids were found during                            retroflexion. The hemorrhoids were small.                           The exam was  otherwise without abnormality. Complications:            No immediate complications. Estimated Blood Loss:     Estimated blood loss was minimal. Impression:               - Severe diverticulosis in the entire examined                            colon. There was narrowing of the colon in                            association with the diverticular opening. There                            was evidence of diverticular spasm.                            Peri-diverticular erythema was seen. There was  evidence of an impacted diverticulum. There was no                            evidence of diverticular bleeding.                           - One 3 mm polyp in the ascending colon, removed                            with a cold biopsy forceps. Resected and retrieved.                           - One 7 mm polyp in the ascending colon, removed                            with a cold snare. Resected and retrieved.                           - Non-bleeding internal hemorrhoids.                           - The examination was otherwise normal. Recommendation:           - Jimenez has a contact number available for                            emergencies. The signs and symptoms of potential                            delayed complications were discussed with the                            Jimenez. Return to normal activities tomorrow.                            Written discharge instructions were provided to the                            Jimenez.                           - Resume previous diet.                           - Continue present medications.                           - Await pathology results.                           - No repeat colonoscopy due to age.                           - Resume Plavix (clopidogrel) at prior dose  tomorrow. Refer to managing physician for further                            adjustment of therapy. Mauri Pole,  MD 11/24/2017 3:52:53 PM This report has been signed electronically.

## 2017-11-24 NOTE — Progress Notes (Signed)
Called to room to assist during endoscopic procedure.  Patient ID and intended procedure confirmed with present staff. Received instructions for my participation in the procedure from the performing physician.  

## 2017-11-24 NOTE — Progress Notes (Signed)
Report given to PACU, vss 

## 2017-11-24 NOTE — Op Note (Signed)
[facility] Patient Name: Curtis Jimenez Procedure Date: 11/24/2017 3:01 PM MRN: [id number] Endoscopist: Mauri Pole , MD Age: 81 Referring MD:  Date of Birth: 02-11-1936 Gender: Male Account #: [id number] Procedure:                Upper GI endoscopy Indications:              Suspected upper gastrointestinal bleeding in                            patient with unexplained iron deficiency anemia Medicines:                Monitored Anesthesia Care Procedure:                Pre-Anesthesia Assessment:                           - Prior to the procedure, a History and Physical                            was performed, and patient medications and                            allergies were reviewed. [name] tolerance of                            previous anesthesia was also reviewed. The risks                            and benefits of the procedure and the sedation                            options and risks were discussed with the patient.                            All questions were answered, and informed consent                            was obtained. Prior Anticoagulants: The patient                            last took Plavix (clopidogrel) 5 days prior to the                            procedure. ASA Grade Assessment: III - A patient                            with severe systemic disease. After reviewing the                            risks and benefits, the patient was deemed in                            satisfactory condition to undergo the procedure.  After obtaining informed consent, the endoscope was                            passed under direct vision. Throughout the                            procedure, the patient's blood pressure, pulse, and                            oxygen saturations were monitored continuously. The                            Endoscope was introduced through the mouth, and   advanced to the second part of duodenum. The upper                            GI endoscopy was accomplished without difficulty.                            The patient tolerated the procedure well. Scope In: Scope Out: Findings:                 The esophagus was normal.                           A few less than 5 mm sessile polyps with no                            bleeding and no stigmata of recent bleeding were                            found in the gastric fundus and in the gastric                            body. Otherwise normal gastric mucosa.                           The examined duodenum was normal. Complications:            No immediate complications. Estimated Blood Loss:     Estimated blood loss: none. Impression:               - Normal esophagus.                           - A few gastric polyps, benign appearing fundic                            gland polyps.                           - Normal examined duodenum.                           - No specimens collected. Recommendation:           - Patient has a contact number available for  emergencies. The signs and symptoms of potential                            delayed complications were discussed with the                            patient. Return to normal activities tomorrow.                            Written discharge instructions were provided to the                            patient.                           - Proceed with colonoscopy Mauri Pole, MD 11/24/2017 3:49:39 PM This report has been signed electronically.

## 2017-11-24 NOTE — Progress Notes (Signed)
No problems noted in the recovery room. Maw  Pt was light headed.  He took his time getting dressed.  He let me know when he was ready to be discharged.  No complaints at discharge. maw

## 2017-11-24 NOTE — Patient Instructions (Signed)
YOU HAD AN ENDOSCOPIC PROCEDURE TODAY AT [facility]:   Refer to the procedure report that was given to you for any specific questions about what was found during the examination.  If the procedure report does not answer your questions, please call your gastroenterologist to clarify.  If you requested that your care partner not be given the details of your procedure findings, then the procedure report has been included in a sealed envelope for you to review at your convenience later.  YOU SHOULD EXPECT: Some feelings of bloating in the abdomen. Passage of more gas than usual.  Walking can help get rid of the air that was put into your GI tract during the procedure and reduce the bloating. If you had a lower endoscopy (such as a colonoscopy or flexible sigmoidoscopy) you may notice spotting of blood in your stool or on the toilet paper. If you underwent a bowel prep for your procedure, you may not have a normal bowel movement for a few days.  Please Note:  You might notice some irritation and congestion in your nose or some drainage.  This is from the oxygen used during your procedure.  There is no need for concern and it should clear up in a day or so.  SYMPTOMS TO REPORT IMMEDIATELY:   Following lower endoscopy (colonoscopy or flexible sigmoidoscopy):  Excessive amounts of blood in the stool  Significant tenderness or worsening of abdominal pains  Swelling of the abdomen that is new, acute  Fever of 100F or higher   Following upper endoscopy (EGD)  Vomiting of blood or coffee ground material  New chest pain or pain under the shoulder blades  Painful or persistently difficult swallowing  New shortness of breath  Fever of 100F or higher  Black, tarry-looking stools  For urgent or emergent issues, a gastroenterologist can be reached at any hour by calling [phone].   DIET:  We do recommend a small meal at first, but then you may proceed to your regular diet.  Drink  plenty of fluids but you should avoid alcoholic beverages for 24 hours.  ACTIVITY:  You should plan to take it easy for the rest of today and you should NOT DRIVE or use heavy machinery until tomorrow (because of the sedation medicines used during the test).    FOLLOW UP: Our staff will call the number listed on your records the next business day following your procedure to check on you and address any questions or concerns that you may have regarding the information given to you following your procedure. If we do not reach you, we will leave a message.  However, if you are feeling well and you are not experiencing any problems, there is no need to return our call.  We will assume that you have returned to your regular daily activities without incident.  If any biopsies were taken you will be contacted by phone or by letter within the next 1-3 weeks.  Please call us at [phone] if you have not heard about the biopsies in 3 weeks.    SIGNATURES/CONFIDENTIALITY: You and/or your care partner have signed paperwork which will be entered into your electronic medical record.  These signatures attest to the fact that that the information above on your After Visit Summary has been reviewed and is understood.  Full responsibility of the confidentiality of this discharge information lies with you and/or your care-partner.    Handouts were given to your care partner on polyps,  diverticulosis, and hemorrhoids. Per Dr. Silverio Decamp you may resume your PLAVIX at prior dose tomorrow. You may resume your other medications today. Await biopsy results. Please call if any questions or concerns.

## 2017-11-25 ENCOUNTER — Telehealth: Payer: Self-pay | Admitting: *Deleted

## 2017-11-25 NOTE — Telephone Encounter (Signed)
  Follow up Call-  Call back number 11/24/2017  Post procedure Call Back phone  # [phone]  Permission to leave phone message Yes  Some recent data might be hidden     Patient questions:  Do you have a fever, pain , or abdominal swelling? No. Pain Score  0 *  Have you tolerated food without any problems? Yes.    Have you been able to return to your normal activities? Yes.    Do you have any questions about your discharge instructions: Diet   No. Medications  No. Follow up visit  No.  Do you have questions or concerns about your Care? No.  Actions: * If pain score is 4 or above: No action needed, pain <4.

## 2017-11-26 ENCOUNTER — Ambulatory Visit: Payer: Medicare HMO | Admitting: Physician Assistant

## 2017-11-27 ENCOUNTER — Other Ambulatory Visit: Payer: Self-pay | Admitting: Family Medicine

## 2017-11-30 ENCOUNTER — Encounter: Payer: Self-pay | Admitting: Gastroenterology

## 2017-12-01 ENCOUNTER — Encounter: Payer: Self-pay | Admitting: Gastroenterology

## 2017-12-06 ENCOUNTER — Ambulatory Visit: Payer: Medicare HMO | Admitting: Gastroenterology

## 2017-12-10 ENCOUNTER — Encounter: Payer: Self-pay | Admitting: Family Medicine

## 2017-12-10 ENCOUNTER — Ambulatory Visit (INDEPENDENT_AMBULATORY_CARE_PROVIDER_SITE_OTHER): Payer: Medicare HMO | Admitting: Family Medicine

## 2017-12-10 ENCOUNTER — Other Ambulatory Visit: Payer: Self-pay

## 2017-12-10 VITALS — BP 106/61 | HR 73 | Temp 97.8°F | Resp 16 | Ht 69.0 in | Wt 203.0 lb

## 2017-12-10 DIAGNOSIS — I10 Essential (primary) hypertension: Secondary | ICD-10-CM

## 2017-12-10 DIAGNOSIS — D500 Iron deficiency anemia secondary to blood loss (chronic): Secondary | ICD-10-CM | POA: Diagnosis not present

## 2017-12-10 DIAGNOSIS — N183 Chronic kidney disease, stage 3 unspecified: Secondary | ICD-10-CM

## 2017-12-10 NOTE — Progress Notes (Signed)
OFFICE VISIT  12/10/2017   CC:  Chief Complaint  Patient presents with  . Follow-up    HTN   HPI:    Patient is a 81 y.o. Caucasian male who presents for f/u HTN and iron def anemia secondary to chronic GI blood loss. EGD 11/24/17 normal except a couple of gastric polyps. Colonoscopy 11/24/17: 2 polyps and severe diverticular dz, no sign of active bleeding.  HTN: home monitoring was showing systolic 892J so he restarted coreg 6.25mg  bid. Occ lightheaded feeling with standing up. Overall, his avg bp since restarting coreg has been about 135/70s, HR avg 80s.  Chronic GI bleed w/iron def anemia: no melena but stool is dark from the iron he takes. No abd pain.  Energy level improving.  Taking iron once daily. Eating red meat more lately as well.  Past Medical History:  Diagnosis Date  . Anemia   . Arthritis   . Basal cell carcinoma    neck (skin MD 2X per year)  . Bipolar disorder ([place])   . BPH (benign prostatic hyperplasia)    with elevated PSA; followed by Dr. Rosana Hoes at [facility]  . Cataract   . Chronic renal insufficiency, stage III (moderate) (HCC)    Last f/u with neph 07/06/16: Cr stable at 1.57 (GFR 42)  . CVA (cerebral vascular accident) ([place]) 11/2016   Pontine (vertebrobasilar--imaging neg), TPA given.  Pt discharged on plavix.  Carotid dopplers ok, echocardiogram ok.  Residual deficit: vertigo but this improved greatly with therapy.  . Gallbladder polyp 2015   Asymptomatic  . GERD (gastroesophageal reflux disease)   . Gout   . Hyperlipidemia   . Hypertension   . Hypothyroidism   . Rectal bleeding 09/2017   Admitted for obs due to Hb drop.  GI consulted---obs recommended.  No transfusion required.  Plavix was d/c'd, ferrous sulfate recommended.  Outpt GI f/u--plavix restarted and upper/lower endo's recommended.  Marland Kitchen Restless legs syndrome   . S/p cadaver renal transplant 2013   Secondary to HTN +lithium toxicity over 30 yrs caused kidney destruction [facility]  transplant MDs)    Past Surgical History:  Procedure Laterality Date  . AV FISTULA PLACEMENT  04/08/2012   Procedure: ARTERIOVENOUS (AV) FISTULA CREATION;  Surgeon: Angelia Mould, MD;  Location: [facility] OR;  Service: Vascular;  Laterality: Left;  Creation of left brachial cephalic arteriovenous fistula  . COLONOSCOPY  2013   Normal except diverticulosis (recall 10 yrs)  . dilation for GERD    . KIDNEY TRANSPLANT  11/06/12   [place] (cadaveric)  . PROSTATE BIOPSY  2011   no malignancy  . TRANSTHORACIC ECHOCARDIOGRAM  11/2016   Normal LV systolic fxn, EF 19-41%.  Grade I DD.  Mild aortic root dilatation, mild MV regurg.    Outpatient Medications Prior to Visit  Medication Sig Dispense Refill  . acetaminophen (TYLENOL) 325 MG tablet Take 650 mg by mouth every 6 (six) hours as needed for mild pain.    Marland Kitchen allopurinol (ZYLOPRIM) 300 MG tablet TAKE ONE TABLET BY MOUTH ONCE DAILY 90 tablet 3  . Cholecalciferol (VITAMIN D) 2000 UNITS CAPS Take 1 capsule by mouth daily.    . clopidogrel (PLAVIX) 75 MG tablet Take 1 tablet by mouth daily.    . colchicine 0.6 MG tablet 2 tabs at onset of gout flare, then 1 tab one hour later, then starting the next day take 1 tab bid until gout flare is resolved 20 tablet 1  . ferrous sulfate 325 (65 FE) MG  tablet Take 1 tablet (325 mg total) daily by mouth. 30 tablet 3  . hydrOXYzine (ATARAX/VISTARIL) 10 MG tablet Take 10 mg by mouth 2 (two) times daily as needed.     Marland Kitchen levothyroxine (SYNTHROID, LEVOTHROID) 25 MCG tablet TAKE ONE TABLET BY MOUTH ONCE DAILY 90 tablet 3  . mycophenolate (MYFORTIC) 180 MG EC tablet Take 360 mg by mouth 2 (two) times daily.     . Omega-3 1000 MG CAPS Take 2,000 mg by mouth 2 (two) times daily.     Marland Kitchen omeprazole (PRILOSEC) 20 MG capsule TAKE ONE CAPSULE BY MOUTH ONCE DAILY 90 capsule 3  . risperiDONE (RISPERDAL) 2 MG tablet Take 2 mg by mouth at bedtime.    . simvastatin (ZOCOR) 20 MG tablet Take 20 mg by mouth daily.     Marland Kitchen  sulfamethoxazole-trimethoprim (BACTRIM,SEPTRA) 400-80 MG tablet Take 1 tablet by mouth every Monday, Wednesday, and Friday.    . tacrolimus (PROGRAF) 0.5 MG capsule Take 0.5 mg by mouth at bedtime. Also taking 1mg  in the morning    . tacrolimus (PROGRAF) 1 MG capsule Take 1 mg by mouth every morning. Also taking 0.5mg  at bedtime    . vitamin B-12 (CYANOCOBALAMIN) 100 MCG tablet Take 100 mcg by mouth daily.    . carvedilol (COREG) 6.25 MG tablet Take 12.5 mg by mouth daily.    Marland Kitchen 0.9 %  sodium chloride infusion      No facility-administered medications prior to visit.     Allergies  Allergen Reactions  . Amantadine Hcl Itching    REACTION: itching  . Chlorpromazine Hcl     REACTION: "fell flat on face"    ROS As per HPI  PE: Blood pressure 106/61, pulse 73, temperature 97.8 F (36.6 C), temperature source Oral, resp. rate 16, height 5\' 9"  (1.753 m), weight 203 lb (92.1 kg), SpO2 93 %. Gen: Alert, well appearing.  Patient is oriented to person, place, time, and situation. AFFECT: pleasant, lucid thought and speech. CV: RRR, no m/r/g.   LUNGS: CTA bilat, nonlabored resps, good aeration in all lung fields. Ext: no cyanosis.  Trace pitting edema bilat  LABS:    Chemistry      Component Value Date/Time   NA 140 10/20/2017 1407   NA 141 12/14/2011   K 4.8 10/20/2017 1407   CL 108 10/20/2017 1407   CO2 23 10/20/2017 1407   BUN 23 10/20/2017 1407   BUN 35 (A) 12/14/2011   CREATININE 2.05 (H) 10/20/2017 1407   GLU 120 12/14/2011      Component Value Date/Time   CALCIUM 8.7 10/20/2017 1407   CALCIUM 9.5 12/06/2010 0046   ALKPHOS 36 (L) 10/08/2017 0305   AST 13 (L) 10/08/2017 0305   ALT 10 (L) 10/08/2017 0305   BILITOT 0.7 10/08/2017 0305     Lab Results  Component Value Date   WBC 5.5 11/05/2017   HGB 9.2 (L) 11/05/2017   HCT 29.2 (L) 11/05/2017   MCV 87.7 11/05/2017   PLT 286 11/05/2017   Lab Results  Component Value Date   IRON 31 (L) 10/12/2017   TIBC 314  10/12/2017   FERRITIN 18 (L) 10/12/2017     IMPRESSION AND PLAN:  1) HTN: controlled now, back on coreg 6.25mg  bid.  2) CRI stage III: BMET today.  3) Iron def anemia, suspected chronic GI blood loss from severe diverticular dz/diverticular bleeding. Has f/u office visit with GI 12/17/17. Continue high iron diet, iron supplement daily, + MVI qd. He  is feeling better.  Recheck CBC and iron labs today.  An After Visit Summary was printed and given to the patient.  FOLLOW UP: Return in about 3 months (around 03/10/2018) for routine chronic illness f/u.  Signed:  Crissie Sickles, MD           12/10/2017

## 2017-12-11 LAB — IRON: Iron: 23 ug/dL — ABNORMAL LOW (ref 50–180)

## 2017-12-11 LAB — BASIC METABOLIC PANEL
BUN/Creatinine Ratio: 12 (calc) (ref 6–22)
BUN: 20 mg/dL (ref 7–25)
CHLORIDE: 107 mmol/L (ref 98–110)
CO2: 24 mmol/L (ref 20–32)
CREATININE: 1.63 mg/dL — AB (ref 0.70–1.18)
Calcium: 9.3 mg/dL (ref 8.6–10.3)
Glucose, Bld: 100 mg/dL — ABNORMAL HIGH (ref 65–99)
POTASSIUM: 4.4 mmol/L (ref 3.5–5.3)
SODIUM: 141 mmol/L (ref 135–146)

## 2017-12-11 LAB — CBC
HEMATOCRIT: 36.9 % — AB (ref 38.5–50.0)
HEMOGLOBIN: 11.5 g/dL — AB (ref 13.2–17.1)
MCH: 26.8 pg — ABNORMAL LOW (ref 27.0–33.0)
MCHC: 31.2 g/dL — ABNORMAL LOW (ref 32.0–36.0)
MCV: 86 fL (ref 80.0–100.0)
MPV: 10.1 fL (ref 7.5–12.5)
Platelets: 259 10*3/uL (ref 140–400)
RBC: 4.29 10*6/uL (ref 4.20–5.80)
RDW: 13.7 % (ref 11.0–15.0)
WBC: 5.9 10*3/uL (ref 3.8–10.8)

## 2017-12-15 ENCOUNTER — Encounter: Payer: Self-pay | Admitting: Family Medicine

## 2017-12-17 ENCOUNTER — Encounter: Payer: Self-pay | Admitting: Physician Assistant

## 2017-12-17 ENCOUNTER — Ambulatory Visit: Payer: Medicare HMO | Admitting: Physician Assistant

## 2017-12-17 VITALS — BP 102/46 | HR 76 | Ht 69.0 in | Wt 204.0 lb

## 2017-12-17 DIAGNOSIS — D508 Other iron deficiency anemias: Secondary | ICD-10-CM

## 2017-12-17 NOTE — Progress Notes (Signed)
Reviewed and agree with documentation and assessment and plan. K. Veena Nandigam , MD   

## 2017-12-17 NOTE — Progress Notes (Signed)
Chief Complaint: Follow up Gi Bleed  HPI:  Curtis Jimenez is a 81 y/o Caucasian male with a past medical history as listed below including CVA on Plavix, who presents to clinic today for follow up of [place].   Per chart review, patient was seen in clinic after hospitalization in October for Gi bleed, thought diverticular. He was arranged for Colo and EGD for further eval of his ongoing IDA.  Patient had colo 11/24/17 with Dr. Silverio Decamp, this revealed severe diverticulosis in the entire colon, with narrowing and evidence of diverticular spasm and peri-diverticular erythema, 2 polyps and non-bleeding internal hemorrhoids. EGD revealed a few gastric polyps and was otherwise normal. Patient has been maintained on iron and his last hgb was 12/10/17 improved to 11.5 from 9.2 previously.   Today, the patient presents to clinic with his wife and they explain that he has been doing well, he has been changing his diet to include more iron rich foods and also continues on oral iron. He does describe occasional constipation since starting iron and uses Miralax prn with good results.  He does continue to need " a few more naps than normal", but denies any true fatigue, SOB or dizziness.   Patient denies fever, chills, blood in stool, weight loss, anorexia, nausea, vomiting, heartburn or reflux.  Past Medical History:  Diagnosis Date  . Anemia   . Arthritis   . Basal cell carcinoma    neck (skin MD 2X per year)  . Bipolar disorder ([place])   . BPH (benign prostatic hyperplasia)    with elevated PSA; followed by Dr. Rosana [facility]es at [facility]  . Cataract   . Chronic renal insufficiency, stage III (moderate) (HCC)    Last f/u with neph 07/06/16: Cr stable at 1.57 (GFR 42)  . CVA (cerebral vascular accident) ([place]) 11/2016   Pontine (vertebrobasilar--imaging neg), TPA given.  Pt discharged on plavix.  Carotid dopplers ok, echocardiogram ok.  Residual deficit: vertigo but this improved greatly with therapy.  .  Gallbladder polyp 2015   Asymptomatic  . GERD (gastroesophageal reflux disease)   . Gout   . History of adenomatous polyp of colon 2018   Recall 5 yrs  . Hyperlipidemia   . Hypertension   . Hypothyroidism   . Rectal bleeding 09/2017   Admitted for obs due to Hb drop.  GI consulted---obs recommended.  No transfusion required.  Plavix was d/c'd, ferrous sulfate recommended.  Outpt GI f/u--plavix restarted and upper endoscopy was normal and colonoscopy showed 2 polyps and severe diverticular dz.  . Restless legs syndrome   . S/p cadaver renal transplant 2013   Secondary to HTN +lithium toxicity over 30 yrs caused kidney destruction [facility] transplant MDs)    Past Surgical History:  Procedure Laterality Date  . AV FISTULA PLACEMENT  04/08/2012   Procedure: ARTERIOVENOUS (AV) FISTULA CREATION;  Surgeon: Angelia Mould, MD;  Location: [facility] OR;  Service: Vascular;  Laterality: Left;  Creation of left brachial cephalic arteriovenous fistula  . COLONOSCOPY  2013; 11/24/17   2013; Normal except diverticulosis (recall 10 yrs).  2018 (for GI bleeding)--severe diverticular dz + 2 adenomatous polyps.  Recall 5 yrs.  . dilation for GERD    . ESOPHAGOGASTRODUODENOSCOPY  11/24/2017   gastric polyps x 2, otherwise normal.  . KIDNEY TRANSPLANT  11/06/12   [place] (cadaveric)  . PROSTATE BIOPSY  2011   no malignancy  . TRANSTHORACIC ECHOCARDIOGRAM  11/2016   Normal LV systolic fxn, EF 19-41%.  Grade I  DD.  Mild aortic root dilatation, mild MV regurg.    Current Outpatient Medications  Medication Sig Dispense Refill  . acetaminophen (TYLENOL) 325 MG tablet Take 650 mg by mouth every 6 (six) hours as needed for mild pain.    Marland Kitchen allopurinol (ZYLOPRIM) 300 MG tablet TAKE ONE TABLET BY MOUTH ONCE DAILY 90 tablet 3  . carvedilol (COREG) 6.25 MG tablet Take 12.5 mg by mouth daily.    . Cholecalciferol (VITAMIN D) 2000 UNITS CAPS Take 1 capsule by mouth daily.    . clopidogrel (PLAVIX) 75 MG tablet Take 1  tablet by mouth daily.    . colchicine 0.6 MG tablet 2 tabs at onset of gout flare, then 1 tab one hour later, then starting the next day take 1 tab bid until gout flare is resolved 20 tablet 1  . ferrous sulfate 325 (65 FE) MG tablet Take 1 tablet (325 mg total) daily by mouth. 30 tablet 3  . hydrOXYzine (ATARAX/VISTARIL) 10 MG tablet Take 10 mg by mouth 2 (two) times daily as needed.     Marland Kitchen levothyroxine (SYNTHROID, LEVOTHROID) 25 MCG tablet TAKE ONE TABLET BY MOUTH ONCE DAILY 90 tablet 3  . mycophenolate (MYFORTIC) 180 MG EC tablet Take 360 mg by mouth 2 (two) times daily.     . Omega-3 1000 MG CAPS Take 2,000 mg by mouth 2 (two) times daily.     Marland Kitchen omeprazole (PRILOSEC) 20 MG capsule TAKE ONE CAPSULE BY MOUTH ONCE DAILY 90 capsule 3  . risperiDONE (RISPERDAL) 2 MG tablet Take 2 mg by mouth at bedtime.    . simvastatin (ZOCOR) 20 MG tablet Take 20 mg by mouth daily.     Marland Kitchen sulfamethoxazole-trimethoprim (BACTRIM,SEPTRA) 400-80 MG tablet Take 1 tablet by mouth every Monday, Wednesday, and Friday.    . tacrolimus (PROGRAF) 0.5 MG capsule Take 0.5 mg by mouth at bedtime. Also taking 1mg  in the morning    . tacrolimus (PROGRAF) 1 MG capsule Take 1 mg by mouth every morning. Also taking 0.5mg  at bedtime    . vitamin B-12 (CYANOCOBALAMIN) 100 MCG tablet Take 100 mcg by mouth daily.     No current facility-administered medications for this visit.     Allergies as of 12/17/2017 - Review Complete 12/17/2017  Allergen Reaction Noted  . Amantadine hcl Itching 04/23/2010  . Chlorpromazine hcl  04/04/2014    Family History  Problem Relation Age of Onset  . Ulcers Mother        GI bleed   . Heart disease Father   . Bladder Cancer Father   . Colon cancer Neg Hx   . Esophageal cancer Neg Hx   . Pancreatic cancer Neg Hx   . Rectal cancer Neg Hx   . Stomach cancer Neg Hx     Social History   Socioeconomic History  . Marital status: Married    Spouse name: Not on file  . Number of children: Not  on file  . Years of education: Not on file  . Highest education level: Not on file  Social Needs  . Financial resource strain: Not on file  . Food insecurity - worry: Not on file  . Food insecurity - inability: Not on file  . Transportation needs - medical: Not on file  . Transportation needs - non-medical: Not on file  Occupational History  . Not on file  Tobacco Use  . Smoking status: Former Smoker    Years: 16.00    Types: Pipe, Landscape architect  Last attempt to quit: 05/11/1974    Years since quitting: 43.6  . Smokeless tobacco: Never Used  Substance and Sexual Activity  . Alcohol use: No    Alcohol/week: 0.0 oz  . Drug use: No  . Sexual activity: Not Currently    Partners: Female  Other Topics Concern  . Not on file  Social History Narrative   Married, 4 children, 9 GGC, [place].   Occupation: retired Marine scientist.  Originally from [place] [employer] [place].   Tobacco: quit 1975; smoked pipes and cigars x 15 yrs prior to this.   Alcohol: none.   Exercise: minimal, but is going to sign up for silver sneakers at the [facility].    Review of Systems:    Constitutional: No weight loss, fever, chills, weakness or fatigue Cardiovascular: No chest pain Respiratory: No SOB  Gastrointestinal: See HPI and otherwise negative    Physical Exam:  Vital signs: BP (!) 102/46   Pulse 76   Ht 5\' 9"  (1.753 m)   Wt 204 lb (92.5 kg)   BMI 30.13 kg/m   Constitutional:   Pleasant Caucasian male appears to be in NAD, Well developed, Well nourished, alert and cooperative Respiratory: Respirations even and unlabored. Lungs clear to auscultation bilaterally.   No wheezes, crackles, or rhonchi.  Cardiovascular: Normal S1, S2. No MRG. Regular rate and rhythm. No peripheral edema, cyanosis or pallor.  Gastrointestinal:  Soft, nondistended, nontender. No rebound or guarding. Normal bowel sounds. No appreciable masses or hepatomegaly. Psychiatric: Demonstrates good judgement and reason without abnormal affect or  behaviors.  MOST RECENT LABS AND IMAGING: CBC    Component Value Date/Time   WBC 5.9 12/10/2017 1011   RBC 4.29 12/10/2017 1011   HGB 11.5 (L) 12/10/2017 1011   HCT 36.9 (L) 12/10/2017 1011   PLT 259 12/10/2017 1011   MCV 86.0 12/10/2017 1011   MCH 26.8 (L) 12/10/2017 1011   MCHC 31.2 (L) 12/10/2017 1011   RDW 13.7 12/10/2017 1011   LYMPHSABS 650 (L) 10/20/2017 1407   MONOABS 0.6 10/12/2017 1043   EOSABS 151 10/20/2017 1407   BASOSABS 52 10/20/2017 1407    CMP     Component Value Date/Time   NA 141 12/10/2017 1011   NA 141 12/14/2011   K 4.4 12/10/2017 1011   CL 107 12/10/2017 1011   CO2 24 12/10/2017 1011   GLUCOSE 100 (H) 12/10/2017 1011   GLUCOSE 103 (H) 12/15/2006 1045   BUN 20 12/10/2017 1011   BUN 35 (A) 12/14/2011   CREATININE 1.63 (H) 12/10/2017 1011   CALCIUM 9.3 12/10/2017 1011   CALCIUM 9.5 12/06/2010 0046   PROT 4.4 (L) 10/08/2017 0305   ALBUMIN 2.5 (L) 10/08/2017 0305   AST 13 (L) 10/08/2017 0305   ALT 10 (L) 10/08/2017 0305   ALKPHOS 36 (L) 10/08/2017 0305   BILITOT 0.7 10/08/2017 0305   GFRNONAA 38 (L) 10/09/2017 0431   GFRAA 44 (L) 10/09/2017 0431    Assessment: 1. IDA: h/o suspected diverticular bleed in October, hgb now improving, recent colo/EGD-severe diverticular disease  Plan: 1. Continue on iron supplementation and follow with PCP regarding repeat labs and d/c of iron in the future 2. Continue Miralax prn 3. Follow in clinic as needed with Dr. Silverio Decamp or myself.  Ellouise Newer, PA-C [facility] 12/17/2017, 11:42 AM  Cc: McGowen, Adrian Blackwater, MD

## 2017-12-22 ENCOUNTER — Ambulatory Visit: Payer: Medicare HMO | Admitting: Gastroenterology

## 2018-01-10 ENCOUNTER — Other Ambulatory Visit: Payer: Self-pay | Admitting: Dermatology

## 2018-01-10 DIAGNOSIS — D099 Carcinoma in situ, unspecified: Secondary | ICD-10-CM

## 2018-01-10 DIAGNOSIS — D0461 Carcinoma in situ of skin of right upper limb, including shoulder: Secondary | ICD-10-CM | POA: Diagnosis not present

## 2018-01-10 DIAGNOSIS — D0439 Carcinoma in situ of skin of other parts of face: Secondary | ICD-10-CM | POA: Diagnosis not present

## 2018-01-10 DIAGNOSIS — D229 Melanocytic nevi, unspecified: Secondary | ICD-10-CM | POA: Diagnosis not present

## 2018-01-10 DIAGNOSIS — D492 Neoplasm of unspecified behavior of bone, soft tissue, and skin: Secondary | ICD-10-CM | POA: Diagnosis not present

## 2018-01-10 DIAGNOSIS — L219 Seborrheic dermatitis, unspecified: Secondary | ICD-10-CM | POA: Diagnosis not present

## 2018-01-10 DIAGNOSIS — L570 Actinic keratosis: Secondary | ICD-10-CM | POA: Diagnosis not present

## 2018-01-10 HISTORY — DX: Carcinoma in situ, unspecified: D09.9

## 2018-01-12 ENCOUNTER — Ambulatory Visit: Payer: Self-pay

## 2018-01-12 MED ORDER — CARVEDILOL 12.5 MG PO TABS: 12.5000 mg | ORAL_TABLET | Freq: Two times a day (BID) | ORAL | 3 refills | Status: DC

## 2018-01-12 NOTE — Telephone Encounter (Signed)
Please advise. Thanks.  

## 2018-01-12 NOTE — Telephone Encounter (Signed)
Pt advised and voiced understanding.   

## 2018-01-12 NOTE — Telephone Encounter (Signed)
Patient called in to report high blood pressure. His readings are as follows: 01/12/18 at 1000-175/105 (prior to triage call) 01/12/18 at 1115-126/79 (after taking his meds as stated by patient while on triage call) 01/09/18 AM-163/100 and PM 163/100 01/01/18 AM-136/77 12/31/17 PM-157/86 Patient denies headache, dizziness, chest pain, numbness/weakness of extremities, blurred vision at any time. He reported he takes Coreg 6.25 mg BID; ordered on his chart 12.5 mg daily. Protocol indicates home care advice, I advised him to keep a record of his BP readings twice a day for the next week or longer and to call back for an OV if the readings remain elevated or if he has any symptoms listed above, he verbalized understanding. I also advised the provider would see these readings and if any adjustment to his medication is needed, he will be notified, he verbalized understanding.   Reason for Disposition . Systolic BP between 932-355 with Diastolic between 73-22  Answer Assessment - Initial Assessment Questions 1. BLOOD PRESSURE: "What is the blood pressure?" "Did you take at least two measurements 5 minutes apart?"     175/105 this morning before medication;  2. ONSET: "When did you take your blood pressure?"     1000 this morning 3. HOW: "How did you obtain the blood pressure?" (e.g., visiting nurse, automatic home BP monitor)     Automatic home BP monitor 4. HISTORY: "Do you have a history of high blood pressure?"    Yes 5. MEDICATIONS: "Are you taking any medications for blood pressure?" "Have you missed any doses recently?"     Yes and no 6. OTHER SYMPTOMS: "Do you have any symptoms?" (e.g., headache, chest pain, blurred vision, difficulty breathing, weakness)     Denies 7. PREGNANCY: "Is there any chance you are pregnant?" "When was your last menstrual period?"     N/A  Protocols used: HIGH BLOOD PRESSURE-A-AH

## 2018-01-12 NOTE — Telephone Encounter (Signed)
Pls call pt and have him increase hjis coreg to 12.5 mg twice daily.  I'll send in new rx.-thx

## 2018-01-17 DIAGNOSIS — Z940 Kidney transplant status: Secondary | ICD-10-CM | POA: Diagnosis not present

## 2018-01-22 ENCOUNTER — Other Ambulatory Visit: Payer: Self-pay | Admitting: Family Medicine

## 2018-01-24 ENCOUNTER — Encounter: Payer: Self-pay | Admitting: Family Medicine

## 2018-01-24 DIAGNOSIS — H2513 Age-related nuclear cataract, bilateral: Secondary | ICD-10-CM | POA: Diagnosis not present

## 2018-01-24 DIAGNOSIS — H52203 Unspecified astigmatism, bilateral: Secondary | ICD-10-CM | POA: Diagnosis not present

## 2018-01-24 DIAGNOSIS — H524 Presbyopia: Secondary | ICD-10-CM | POA: Diagnosis not present

## 2018-01-24 MED ORDER — SIMVASTATIN 20 MG PO TABS: 20.0000 mg | ORAL_TABLET | Freq: Every day | ORAL | 1 refills | Status: DC

## 2018-01-28 DIAGNOSIS — I10 Essential (primary) hypertension: Secondary | ICD-10-CM | POA: Diagnosis not present

## 2018-01-28 DIAGNOSIS — Z940 Kidney transplant status: Secondary | ICD-10-CM | POA: Diagnosis not present

## 2018-01-28 DIAGNOSIS — E785 Hyperlipidemia, unspecified: Secondary | ICD-10-CM | POA: Diagnosis not present

## 2018-01-28 DIAGNOSIS — C4492 Squamous cell carcinoma of skin, unspecified: Secondary | ICD-10-CM | POA: Diagnosis not present

## 2018-01-28 DIAGNOSIS — Z792 Long term (current) use of antibiotics: Secondary | ICD-10-CM | POA: Diagnosis not present

## 2018-01-28 DIAGNOSIS — Z8719 Personal history of other diseases of the digestive system: Secondary | ICD-10-CM | POA: Diagnosis not present

## 2018-01-28 DIAGNOSIS — Z79899 Other long term (current) drug therapy: Secondary | ICD-10-CM | POA: Diagnosis not present

## 2018-01-28 DIAGNOSIS — Z8673 Personal history of transient ischemic attack (TIA), and cerebral infarction without residual deficits: Secondary | ICD-10-CM | POA: Diagnosis not present

## 2018-01-28 DIAGNOSIS — Z4822 Encounter for aftercare following kidney transplant: Secondary | ICD-10-CM | POA: Diagnosis not present

## 2018-01-28 DIAGNOSIS — E039 Hypothyroidism, unspecified: Secondary | ICD-10-CM | POA: Diagnosis not present

## 2018-01-28 DIAGNOSIS — R69 Illness, unspecified: Secondary | ICD-10-CM | POA: Diagnosis not present

## 2018-02-03 ENCOUNTER — Other Ambulatory Visit: Payer: Self-pay | Admitting: Family Medicine

## 2018-02-04 DIAGNOSIS — H2513 Age-related nuclear cataract, bilateral: Secondary | ICD-10-CM | POA: Diagnosis not present

## 2018-02-04 DIAGNOSIS — H40033 Anatomical narrow angle, bilateral: Secondary | ICD-10-CM | POA: Diagnosis not present

## 2018-02-04 DIAGNOSIS — H43813 Vitreous degeneration, bilateral: Secondary | ICD-10-CM | POA: Diagnosis not present

## 2018-02-04 DIAGNOSIS — H25011 Cortical age-related cataract, right eye: Secondary | ICD-10-CM | POA: Diagnosis not present

## 2018-02-08 ENCOUNTER — Other Ambulatory Visit: Payer: Self-pay | Admitting: Family Medicine

## 2018-02-10 DIAGNOSIS — D0461 Carcinoma in situ of skin of right upper limb, including shoulder: Secondary | ICD-10-CM | POA: Diagnosis not present

## 2018-02-10 DIAGNOSIS — D0439 Carcinoma in situ of skin of other parts of face: Secondary | ICD-10-CM | POA: Diagnosis not present

## 2018-02-15 DIAGNOSIS — Z940 Kidney transplant status: Secondary | ICD-10-CM | POA: Diagnosis not present

## 2018-02-15 DIAGNOSIS — R69 Illness, unspecified: Secondary | ICD-10-CM | POA: Diagnosis not present

## 2018-02-21 ENCOUNTER — Encounter: Payer: Self-pay | Admitting: Family Medicine

## 2018-02-21 ENCOUNTER — Other Ambulatory Visit: Payer: Self-pay | Admitting: Family Medicine

## 2018-02-21 NOTE — Telephone Encounter (Signed)
RF request for clopidogrel LOV: 12/10/17 Next ov: 03/14/18 Last written: 01/28/17 #90 w/ 3RF  Looks like it was d/c then put back on his med list, wanted to make sure its okay for refill. Please advise. Thanks.

## 2018-03-10 DIAGNOSIS — H25811 Combined forms of age-related cataract, right eye: Secondary | ICD-10-CM | POA: Diagnosis not present

## 2018-03-10 DIAGNOSIS — H25011 Cortical age-related cataract, right eye: Secondary | ICD-10-CM | POA: Diagnosis not present

## 2018-03-10 DIAGNOSIS — H2511 Age-related nuclear cataract, right eye: Secondary | ICD-10-CM | POA: Diagnosis not present

## 2018-03-10 DIAGNOSIS — H2181 Floppy iris syndrome: Secondary | ICD-10-CM | POA: Diagnosis not present

## 2018-03-10 DIAGNOSIS — H52221 Regular astigmatism, right eye: Secondary | ICD-10-CM | POA: Diagnosis not present

## 2018-03-11 ENCOUNTER — Ambulatory Visit: Payer: Medicare HMO | Admitting: Family Medicine

## 2018-03-14 ENCOUNTER — Encounter: Payer: Self-pay | Admitting: Family Medicine

## 2018-03-14 ENCOUNTER — Ambulatory Visit (INDEPENDENT_AMBULATORY_CARE_PROVIDER_SITE_OTHER): Payer: Medicare HMO | Admitting: Family Medicine

## 2018-03-14 ENCOUNTER — Ambulatory Visit: Payer: Medicare HMO | Admitting: Family Medicine

## 2018-03-14 VITALS — BP 110/63 | HR 67 | Temp 97.5°F | Resp 16 | Ht 69.0 in | Wt 201.2 lb

## 2018-03-14 DIAGNOSIS — N183 Chronic kidney disease, stage 3 unspecified: Secondary | ICD-10-CM

## 2018-03-14 DIAGNOSIS — E7800 Pure hypercholesterolemia, unspecified: Secondary | ICD-10-CM

## 2018-03-14 DIAGNOSIS — D508 Other iron deficiency anemias: Secondary | ICD-10-CM

## 2018-03-14 DIAGNOSIS — K5791 Diverticulosis of intestine, part unspecified, without perforation or abscess with bleeding: Secondary | ICD-10-CM

## 2018-03-14 DIAGNOSIS — I10 Essential (primary) hypertension: Secondary | ICD-10-CM | POA: Diagnosis not present

## 2018-03-14 NOTE — Progress Notes (Signed)
OFFICE VISIT  03/14/2018   CC:  Chief Complaint  Patient presents with  . Follow-up    RCI, pt is not fasting.    HPI:    Patient is a 82 y.o. Caucasian male who presents for f/u HTN, HLD, anemia from acute diverticular hemorrhage. He has hx of renal transplant, has CRI stage III followed by Dr. Clover Mealy.  Most recent problem: anemia from acute diverticular bleed about 5 mo ago.  Has been on daily oral iron replacement since that time.  Had to stop iron 3 wks ago due to constipation. His Hb had risen appropriately at most recent f/u check 3 mo ago, due for recheck today. No melena or hematochezia.  HTN: home bp's normal.  No orthostatic dizziness.  No CP, no SOB, no myalgias, arthralgias.  HLD: taking statin daily.  Hydrating well.  CRI III: has nephrol f/u in the next 1-2 wks.  Past Medical History:  Diagnosis Date  . Anemia   . Arthritis   . Basal cell carcinoma    neck (skin MD 2X per year)  . Bipolar disorder ([place])   . BPH (benign prostatic hyperplasia)    with elevated PSA; followed by Dr. Rosana Hoes at [facility]  . Cataract   . Chronic renal insufficiency, stage III (moderate) (HCC)    Last f/u with neph 07/06/16: Cr stable at 1.57 (GFR 42)  . CVA (cerebral vascular accident) ([place]) 11/2016   Pontine (vertebrobasilar--imaging neg), TPA given.  Pt discharged on plavix.  Carotid dopplers ok, echocardiogram ok.  Residual deficit: vertigo but this improved greatly with therapy.  . Gallbladder polyp 2015   Asymptomatic  . GERD (gastroesophageal reflux disease)   . Gout   . History of adenomatous polyp of colon 2018   Recall 5 yrs  . Hyperlipidemia   . Hypertension   . Hypothyroidism   . Rectal bleeding 09/2017   Admitted for obs due to Hb drop.  GI consulted---obs recommended.  No transfusion required.  Plavix was d/c'd, ferrous sulfate recommended.  Outpt GI f/u--plavix restarted and upper endoscopy was normal and colonoscopy showed 2 polyps and severe diverticular  dz.  . Restless legs syndrome   . S/p cadaver renal transplant 2013   Secondary to HTN +lithium toxicity over 30 yrs caused kidney destruction [facility] transplant MDs)  . Seborrheic dermatitis    eyebrows worst: Hytone rx'd by Dr. Denna Haggard.    Past Surgical History:  Procedure Laterality Date  . AV FISTULA PLACEMENT  04/08/2012   Procedure: ARTERIOVENOUS (AV) FISTULA CREATION;  Surgeon: Angelia Mould, MD;  Location: [facility] OR;  Service: Vascular;  Laterality: Left;  Creation of left brachial cephalic arteriovenous fistula  . COLONOSCOPY  2013; 11/24/17   2013; Normal except diverticulosis (recall 10 yrs).  2018 (for GI bleeding)--severe diverticular dz + 2 adenomatous polyps.  Recall 5 yrs.  . dilation for GERD    . ESOPHAGOGASTRODUODENOSCOPY  11/24/2017   gastric polyps x 2, otherwise normal.  . KIDNEY TRANSPLANT  11/06/12   [place] (cadaveric)  . PROSTATE BIOPSY  2011   no malignancy  . TRANSTHORACIC ECHOCARDIOGRAM  11/2016   Normal LV systolic fxn, EF 16-96%.  Grade I DD.  Mild aortic root dilatation, mild MV regurg.    Outpatient Medications Prior to Visit  Medication Sig Dispense Refill  . acetaminophen (TYLENOL) 325 MG tablet Take 650 mg by mouth every 6 (six) hours as needed for mild pain.    Marland Kitchen allopurinol (ZYLOPRIM) 300 MG tablet TAKE ONE TABLET  BY MOUTH ONCE DAILY 90 tablet 3  . Bromfenac Sodium (PROLENSA OP) Place 1 drop into the right eye daily.    . carvedilol (COREG) 12.5 MG tablet Take 1 tablet (12.5 mg total) by mouth 2 (two) times daily with a meal. 60 tablet 3  . Cholecalciferol (VITAMIN D) 2000 UNITS CAPS Take 1 capsule by mouth daily.    . clopidogrel (PLAVIX) 75 MG tablet Take 1 tablet by mouth daily.    . colchicine 0.6 MG tablet 2 tabs at onset of gout flare, then 1 tab one hour later, then starting the next day take 1 tab bid until gout flare is resolved 20 tablet 1  . hydrOXYzine (ATARAX/VISTARIL) 10 MG tablet Take 10 mg by mouth 2 (two) times daily as needed.      Marland Kitchen levothyroxine (SYNTHROID, LEVOTHROID) 25 MCG tablet TAKE ONE TABLET BY MOUTH ONCE DAILY 90 tablet 1  . moxifloxacin (VIGAMOX) 0.5 % ophthalmic solution Place 1 drop into the right eye 4 (four) times daily.    . mycophenolate (MYFORTIC) 180 MG EC tablet Take 360 mg by mouth 2 (two) times daily.     . Omega-3 1000 MG CAPS Take 2,000 mg by mouth 2 (two) times daily.     Marland Kitchen omeprazole (PRILOSEC) 20 MG capsule TAKE ONE CAPSULE BY MOUTH ONCE DAILY 90 capsule 3  . prednisoLONE acetate (PRED FORTE) 1 % ophthalmic suspension Place 1 drop into the right eye 4 (four) times daily.    . risperiDONE (RISPERDAL) 2 MG tablet Take 2 mg by mouth at bedtime.    . simvastatin (ZOCOR) 20 MG tablet Take 1 tablet (20 mg total) by mouth daily. 90 tablet 1  . sulfamethoxazole-trimethoprim (BACTRIM,SEPTRA) 400-80 MG tablet Take 1 tablet by mouth every Monday, Wednesday, and Friday.    . tacrolimus (PROGRAF) 0.5 MG capsule Take 0.5 mg by mouth 2 (two) times daily.     . vitamin B-12 (CYANOCOBALAMIN) 100 MCG tablet Take 100 mcg by mouth daily.    . ferrous sulfate 325 (65 FE) MG tablet Take 1 tablet (325 mg total) daily by mouth. 30 tablet 3  . clopidogrel (PLAVIX) 75 MG tablet TAKE ONE TABLET BY MOUTH ONCE DAILY (Patient not taking: Reported on 03/14/2018) 90 tablet 3  . tacrolimus (PROGRAF) 1 MG capsule Take 1 mg by mouth every morning. Also taking 0.5mg  at bedtime     No facility-administered medications prior to visit.     Allergies  Allergen Reactions  . Amantadine Hcl Itching    REACTION: itching  . Chlorpromazine Hcl     REACTION: "fell flat on face"    ROS As per HPI  PE: Blood pressure 110/63, pulse 67, temperature (!) 97.5 F (36.4 C), temperature source Oral, resp. rate 16, height 5\' 9"  (1.753 m), weight 201 lb 4 oz (91.3 kg), SpO2 96 %. Gen: Alert, well appearing.  Patient is oriented to person, place, time, and situation. AFFECT: pleasant, lucid thought and speech. CV: RRR, no m/r/g.   LUNGS:  CTA bilat, nonlabored resps, good aeration in all lung fields. EXT: no clubbing, cyanosis, or edema.    LABS:  Lab Results  Component Value Date   TSH 0.64 11/05/2017   Lab Results  Component Value Date   WBC 5.9 12/10/2017   HGB 11.5 (L) 12/10/2017   HCT 36.9 (L) 12/10/2017   MCV 86.0 12/10/2017   PLT 259 12/10/2017   Lab Results  Component Value Date   CREATININE 1.63 (H) 12/10/2017   BUN  20 12/10/2017   NA 141 12/10/2017   K 4.4 12/10/2017   CL 107 12/10/2017   CO2 24 12/10/2017   Lab Results  Component Value Date   ALT 10 (L) 10/08/2017   AST 13 (L) 10/08/2017   ALKPHOS 36 (L) 10/08/2017   BILITOT 0.7 10/08/2017   Lab Results  Component Value Date   CHOL 108 11/05/2017   Lab Results  Component Value Date   HDL 45 11/05/2017   Lab Results  Component Value Date   LDLCALC 45 11/05/2017   Lab Results  Component Value Date   TRIG 94 11/05/2017   Lab Results  Component Value Date   CHOLHDL 2.4 11/05/2017   Lab Results  Component Value Date   PSA 4.2 (H) 11/05/2017   PSA 3.51 03/28/2008   PSA 4.83 (H) 12/02/2007   Lab Results  Component Value Date   HGBA1C 5.7 (H) 05/31/2017    IMPRESSION AND PLAN:  1) 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  Has taken about 4 mo of oral iron FeSO4 325mg  qd. Stopped about 3 wks ago due to constipation. We'll see how his Hb and iron studies are today.  Restart iron as appropriate according to lab results.  2) HTN: The current medical regimen is effective;  continue present plan and medications.  3) HLD: tolerating statin.  Lipid panel excellent 10/2017.  Plan repeat lipids approx 10/2018.  4) CRI stage 3, hx of renal transplant: pt has nephrology f/u soon but chooses to go ahead and get BMET here today. We'll fax his results of labs today to Dr. Donato Heinz office.  An After Visit Summary was printed and given to the patient.  FOLLOW UP: Return in about 6 months (around 09/14/2018) for annual CPE  (fasting).  Signed:  Crissie Sickles, MD           03/14/2018

## 2018-03-15 LAB — BASIC METABOLIC PANEL
BUN / CREAT RATIO: 18 (calc) (ref 6–22)
BUN: 25 mg/dL (ref 7–25)
CALCIUM: 9.5 mg/dL (ref 8.6–10.3)
CHLORIDE: 107 mmol/L (ref 98–110)
CO2: 27 mmol/L (ref 20–32)
Creat: 1.37 mg/dL — ABNORMAL HIGH (ref 0.70–1.18)
GLUCOSE: 106 mg/dL — AB (ref 65–99)
Potassium: 4.8 mmol/L (ref 3.5–5.3)
SODIUM: 141 mmol/L (ref 135–146)

## 2018-03-15 LAB — CBC WITH DIFFERENTIAL/PLATELET
BASOS ABS: 50 {cells}/uL (ref 0–200)
Basophils Relative: 1 %
EOS PCT: 2.4 %
Eosinophils Absolute: 120 cells/uL (ref 15–500)
HEMATOCRIT: 44.9 % (ref 38.5–50.0)
Hemoglobin: 14.3 g/dL (ref 13.2–17.1)
Lymphs Abs: 715 cells/uL — ABNORMAL LOW (ref 850–3900)
MCH: 26.3 pg — ABNORMAL LOW (ref 27.0–33.0)
MCHC: 31.8 g/dL — AB (ref 32.0–36.0)
MCV: 82.7 fL (ref 80.0–100.0)
MPV: 9.8 fL (ref 7.5–12.5)
Monocytes Relative: 11.5 %
Neutro Abs: 3540 cells/uL (ref 1500–7800)
Neutrophils Relative %: 70.8 %
Platelets: 216 10*3/uL (ref 140–400)
RBC: 5.43 10*6/uL (ref 4.20–5.80)
RDW: 14.9 % (ref 11.0–15.0)
Total Lymphocyte: 14.3 %
WBC mixed population: 575 cells/uL (ref 200–950)
WBC: 5 10*3/uL (ref 3.8–10.8)

## 2018-03-15 LAB — FERRITIN: Ferritin: 23 ng/mL (ref 20–380)

## 2018-03-15 LAB — IRON AND TIBC
Iron Saturation: 30 % (ref 15–55)
Iron: 86 ug/dL (ref 38–169)
TIBC: 288 ug/dL (ref 250–450)
UIBC: 202 ug/dL (ref 111–343)

## 2018-03-15 NOTE — Progress Notes (Signed)
Labs faxed to Dr. Moshe Cipro.

## 2018-03-16 DIAGNOSIS — E669 Obesity, unspecified: Secondary | ICD-10-CM | POA: Diagnosis not present

## 2018-03-16 DIAGNOSIS — N186 End stage renal disease: Secondary | ICD-10-CM | POA: Diagnosis not present

## 2018-03-16 DIAGNOSIS — R739 Hyperglycemia, unspecified: Secondary | ICD-10-CM | POA: Diagnosis not present

## 2018-03-16 DIAGNOSIS — M109 Gout, unspecified: Secondary | ICD-10-CM | POA: Diagnosis not present

## 2018-03-16 DIAGNOSIS — K922 Gastrointestinal hemorrhage, unspecified: Secondary | ICD-10-CM | POA: Diagnosis not present

## 2018-03-16 DIAGNOSIS — I129 Hypertensive chronic kidney disease with stage 1 through stage 4 chronic kidney disease, or unspecified chronic kidney disease: Secondary | ICD-10-CM | POA: Diagnosis not present

## 2018-03-16 DIAGNOSIS — M7989 Other specified soft tissue disorders: Secondary | ICD-10-CM | POA: Diagnosis not present

## 2018-03-16 DIAGNOSIS — Z940 Kidney transplant status: Secondary | ICD-10-CM | POA: Diagnosis not present

## 2018-03-23 ENCOUNTER — Telehealth: Payer: Self-pay | Admitting: Family Medicine

## 2018-03-23 MED ORDER — CARVEDILOL 12.5 MG PO TABS: 12.5000 mg | ORAL_TABLET | Freq: Two times a day (BID) | ORAL | 11 refills | Status: DC

## 2018-03-23 NOTE — Telephone Encounter (Signed)
Copied from [place] [id number]. Topic: Quick Communication - See Telephone Encounter >> Mar 23, 2018  1:43 PM Aurelio Brash B wrote: CRM for notification. See Telephone encounter for: 03/23/18. Pt is requesting the rx for carvedilol (COREG) 12.5 MG tablet  be changed to 12.5 twice a day   [place], [place] [phone] (Phone) [phone] (Fax)

## 2018-03-23 NOTE — Telephone Encounter (Signed)
Spoke with patient, he stated that last Rx For Coreg filled by pharmacist was 12.5 mg 1 tab in morning and 1/2 tab in PM. This prescription is an old Rx sent in by  Dr. Mercy Moore on 09/02/17. We prescribed Coreg 12.5mg  bid in January. He has been taking 1 tab twice daily which is how it is directed to take currently. Patient is out of medication and needs new Rx sent.

## 2018-03-23 NOTE — Telephone Encounter (Signed)
OK, new coreg 12.5mg  rx done with instructions to take 1 twice daily, #60 per month, 11 RF's.

## 2018-03-24 NOTE — Telephone Encounter (Signed)
Detailed message left on voicemail.

## 2018-03-31 ENCOUNTER — Ambulatory Visit (INDEPENDENT_AMBULATORY_CARE_PROVIDER_SITE_OTHER): Payer: Medicare HMO | Admitting: Family Medicine

## 2018-03-31 ENCOUNTER — Encounter: Payer: Self-pay | Admitting: Family Medicine

## 2018-03-31 ENCOUNTER — Ambulatory Visit
Admission: RE | Admit: 2018-03-31 | Discharge: 2018-03-31 | Disposition: A | Discharge status: Home / Self Care | Payer: Medicare HMO | Source: Ambulatory Visit | Attending: Family Medicine | Admitting: Family Medicine

## 2018-03-31 ENCOUNTER — Telehealth: Payer: Self-pay | Admitting: Family Medicine

## 2018-03-31 VITALS — BP 94/63 | HR 73 | Temp 98.3°F | Resp 16 | Ht 69.0 in | Wt 199.0 lb

## 2018-03-31 DIAGNOSIS — M545 Low back pain, unspecified: Secondary | ICD-10-CM

## 2018-03-31 DIAGNOSIS — M5136 Other intervertebral disc degeneration, lumbar region: Secondary | ICD-10-CM | POA: Diagnosis not present

## 2018-03-31 MED ORDER — CYCLOBENZAPRINE HCL 5 MG PO TABS: ORAL_TABLET | ORAL | 1 refills | Status: DC

## 2018-03-31 NOTE — Patient Instructions (Signed)
Call if not significantly improved in 1 week and we'll arrange physical therapy.  You may also make appt to follow up in the office at any time.

## 2018-03-31 NOTE — Telephone Encounter (Signed)
Copied from [place] [id number]. Topic: Quick Communication - See Telephone Encounter >> Mar 31, 2018  5:01 PM Ivar Drape wrote: CRM for notification. See Telephone encounter for: 03/31/18. [facility] on [address] said the patient's cyclobenzaprine (FLEXERIL) 5 MG tablet medication needs pre authorization.  The telephone number for [place] is [phone].

## 2018-03-31 NOTE — Progress Notes (Signed)
OFFICE VISIT  03/31/2018   CC:  Chief Complaint  Patient presents with  . Back Pain    low back pain    HPI:    Patient is a 82 y.o. Caucasian male with history of multilevel lumbar degenerative dz who presents accompanied by his wife for low back pain.  Unprovoked onset of L and R LB pain about 1 wk ago.  Worse getting up out of sitting or supine position. Trying rest, tylenol 600 mg once daily.  Walking around is fine.  Has been doing rehab exercises at home that he was taught long ago.  Pain extends up his back sometimes but nothing into gluts or legs.  No paresthesias.  No leg weakness.  B/B control intact.  No saddle anesthesia. Has long hx of recurrent bouts of LBP.  ROS: no CP, no abd pain, no SOB, no fevers.  Lumbar spine 2-3 view on 02/09/17: FINDINGS: Multilevel degenerative change. No evidence of fracture or dislocation. Aortoiliac atherosclerotic vascular disease noted. 4 mm calcific density noted over the right upper abdomen. This could represent a a right renal/ureteral stone. Clinical correlation suggested. 2 mm calcific density noted over the left kidney, this could represent tiny left renal stone . Calcifications in pelvis consistent phleboliths.  IMPRESSION: 1. Diffuse multilevel degenerative change. No acute bony abnormality.  2. Aortoiliac atherosclerotic vascular disease.  3. 4 mm calcific density noted over the right upper abdomen. This could represent a tiny stone in the right kidney or right upper ureter. This could represent a small calcified lymph node or phlebolith. Tiny punctate calcific density noted over the left kidney could also represent a tiny kidney stone  Past Medical History:  Diagnosis Date  . Anemia   . Arthritis    Lumbar multilevel degenerative dz  . Basal cell carcinoma    neck (skin MD 2X per year)  . Bipolar disorder ([place])   . BPH (benign prostatic hyperplasia)    with elevated PSA; followed by Dr. Rosana Hoes at [facility]  . Cataract   . Chronic renal insufficiency, stage III (moderate) (HCC)    Last f/u with neph 07/06/16: Cr stable at 1.57 (GFR 42)  . CVA (cerebral vascular accident) ([place]) 11/2016   Pontine (vertebrobasilar--imaging neg), TPA given.  Pt discharged on plavix.  Carotid dopplers ok, echocardiogram ok.  Residual deficit: vertigo but this improved greatly with therapy.  . Gallbladder polyp 2015   Asymptomatic  . GERD (gastroesophageal reflux disease)   . Gout   . History of adenomatous polyp of colon 2018   Recall 5 yrs  . Hyperlipidemia   . Hypertension   . Hypothyroidism   . Rectal bleeding 09/2017   Admitted for obs due to Hb drop.  GI consulted---obs recommended.  No transfusion required.  Plavix was d/c'd, ferrous sulfate recommended.  Outpt GI f/u--plavix restarted and upper endoscopy was normal and colonoscopy showed 2 polyps and severe diverticular dz.  . Restless legs syndrome   . S/p cadaver renal transplant 2013   Secondary to HTN +lithium toxicity over 30 yrs caused kidney destruction [facility] transplant MDs)  . Seborrheic dermatitis    eyebrows worst: Hytone rx'd by Dr. Denna Haggard.    Past Surgical History:  Procedure Laterality Date  . AV FISTULA PLACEMENT  04/08/2012   Procedure: ARTERIOVENOUS (AV) FISTULA CREATION;  Surgeon: Angelia Mould, MD;  Location: [facility] OR;  Service: Vascular;  Laterality: Left;  Creation of left brachial cephalic arteriovenous fistula  . COLONOSCOPY  2013; 11/24/17  2013; Normal except diverticulosis (recall 10 yrs).  2018 (for GI bleeding)--severe diverticular dz + 2 adenomatous polyps.  Recall 5 yrs.  . dilation for GERD    . ESOPHAGOGASTRODUODENOSCOPY  11/24/2017   gastric polyps x 2, otherwise normal.  . KIDNEY TRANSPLANT  11/06/12   [place] (cadaveric)  . PROSTATE BIOPSY  2011   no malignancy  . TRANSTHORACIC ECHOCARDIOGRAM  11/2016   Normal LV systolic fxn, EF 48-18%.  Grade I DD.  Mild aortic root dilatation, mild MV regurg.     Outpatient Medications Prior to Visit  Medication Sig Dispense Refill  . acetaminophen (TYLENOL) 325 MG tablet Take 650 mg by mouth every 6 (six) hours as needed for mild pain.    Marland Kitchen allopurinol (ZYLOPRIM) 300 MG tablet TAKE ONE TABLET BY MOUTH ONCE DAILY 90 tablet 3  . Bromfenac Sodium (PROLENSA OP) Place 1 drop into the right eye daily.    . carvedilol (COREG) 12.5 MG tablet Take 1 tablet (12.5 mg total) by mouth 2 (two) times daily with a meal. 60 tablet 11  . Cholecalciferol (VITAMIN D) 2000 UNITS CAPS Take 1 capsule by mouth daily.    . clopidogrel (PLAVIX) 75 MG tablet Take 1 tablet by mouth daily.    . colchicine 0.6 MG tablet 2 tabs at onset of gout flare, then 1 tab one hour later, then starting the next day take 1 tab bid until gout flare is resolved 20 tablet 1  . hydrOXYzine (ATARAX/VISTARIL) 10 MG tablet Take 10 mg by mouth 2 (two) times daily as needed.     Marland Kitchen levothyroxine (SYNTHROID, LEVOTHROID) 25 MCG tablet TAKE ONE TABLET BY MOUTH ONCE DAILY 90 tablet 1  . moxifloxacin (VIGAMOX) 0.5 % ophthalmic solution Place 1 drop into the right eye 4 (four) times daily.    . mycophenolate (MYFORTIC) 180 MG EC tablet Take 360 mg by mouth 2 (two) times daily.     . Omega-3 1000 MG CAPS Take 2,000 mg by mouth 2 (two) times daily.     Marland Kitchen omeprazole (PRILOSEC) 20 MG capsule TAKE ONE CAPSULE BY MOUTH ONCE DAILY 90 capsule 3  . prednisoLONE acetate (PRED FORTE) 1 % ophthalmic suspension Place 1 drop into the right eye 4 (four) times daily.    . risperiDONE (RISPERDAL) 2 MG tablet Take 2 mg by mouth at bedtime. 1/2 tab at bedtime    . simvastatin (ZOCOR) 20 MG tablet Take 1 tablet (20 mg total) by mouth daily. 90 tablet 1  . sulfamethoxazole-trimethoprim (BACTRIM,SEPTRA) 400-80 MG tablet Take 1 tablet by mouth every Monday, Wednesday, and Friday.    . tacrolimus (PROGRAF) 0.5 MG capsule Take 0.5 mg by mouth 2 (two) times daily.     . vitamin B-12 (CYANOCOBALAMIN) 100 MCG tablet Take 100 mcg by  mouth daily.    . ferrous sulfate 325 (65 FE) MG tablet Take 1 tablet (325 mg total) daily by mouth. 30 tablet 3   No facility-administered medications prior to visit.     Allergies  Allergen Reactions  . Amantadine Hcl Itching    REACTION: itching  . Chlorpromazine Hcl     REACTION: "fell flat on face"    ROS As per HPI  PE: Blood pressure 94/63, pulse 73, temperature 98.3 F (36.8 C), temperature source Oral, resp. rate 16, height 5\' 9"  (1.753 m), weight 199 lb (90.3 kg), SpO2 95 %. Gen: Alert, well appearing.  Patient is oriented to person, place, time, and situation. AFFECT: pleasant, lucid thought and speech.  ROM: intact except a bit limited in flexion to 75 deg, with LB pain noted upon return to upright position.  Extending his back past midline does not increase his pain.  Bending to L causes some L LB pain. No tenderness to palpation anywhere on his back, SI joints, glut regions, or hips.  Hips ROM intact w/out pain.  Sitting SLR neg bilat.  Strength 5/5 prox/dist bilat.  DTRs: patellar trace to 1+ bilat, achilles --cannot elicit any DTR on either side.    LABS:  None Lab Results  Component Value Date   PSA 4.2 (H) 11/05/2017   PSA 3.51 03/28/2008   PSA 4.83 (H) 12/02/2007     IMPRESSION AND PLAN:  Acute low back pain: hx of recurrent musculoskeletal LB strains.  Plain film evidence of multilevel deg jt dz. No radiculopathy sx's. Plan is to check a plain film of L spine, start flexeril 5mg , 1-2 tid prn, #30, RF x 1. Therapeutic expectations and side effect profile of medication discussed today.  Patient's questions answered. Also, increase tylenol to 1000 mg tid daily before considering rx pain med. Add heat 20-30 min 2-3 times per day. Continue home stretching regimen.  An After Visit Summary was printed and given to the patient.  FOLLOW UP: Return if symptoms worsen or fail to improve.  Signed:  Crissie Sickles, MD           03/31/2018

## 2018-04-01 NOTE — Telephone Encounter (Signed)
PA sent on medication for authorization

## 2018-04-04 NOTE — Telephone Encounter (Signed)
PA approved, sent to pharmacy. Patient notified.

## 2018-04-07 DIAGNOSIS — H2512 Age-related nuclear cataract, left eye: Secondary | ICD-10-CM | POA: Diagnosis not present

## 2018-04-07 DIAGNOSIS — H25812 Combined forms of age-related cataract, left eye: Secondary | ICD-10-CM | POA: Diagnosis not present

## 2018-04-07 DIAGNOSIS — H25012 Cortical age-related cataract, left eye: Secondary | ICD-10-CM | POA: Diagnosis not present

## 2018-04-07 DIAGNOSIS — H52222 Regular astigmatism, left eye: Secondary | ICD-10-CM | POA: Diagnosis not present

## 2018-04-30 ENCOUNTER — Other Ambulatory Visit: Payer: Self-pay | Admitting: Family Medicine

## 2018-05-02 NOTE — Telephone Encounter (Signed)
RF request for iron LOV: 03/14/18 Next ov: 09/13/18 Last written: 11/10/17 #30 w/ 3RF  Please advise. Thanks.

## 2018-05-17 DIAGNOSIS — R69 Illness, unspecified: Secondary | ICD-10-CM | POA: Diagnosis not present

## 2018-05-28 HISTORY — PX: CATARACT EXTRACTION, BILATERAL: SHX1313

## 2018-06-14 DIAGNOSIS — Z961 Presence of intraocular lens: Secondary | ICD-10-CM | POA: Diagnosis not present

## 2018-06-20 DIAGNOSIS — I129 Hypertensive chronic kidney disease with stage 1 through stage 4 chronic kidney disease, or unspecified chronic kidney disease: Secondary | ICD-10-CM | POA: Diagnosis not present

## 2018-06-20 DIAGNOSIS — Z940 Kidney transplant status: Secondary | ICD-10-CM | POA: Diagnosis not present

## 2018-06-22 ENCOUNTER — Other Ambulatory Visit: Payer: Self-pay | Admitting: Dermatology

## 2018-06-22 DIAGNOSIS — D485 Neoplasm of uncertain behavior of skin: Secondary | ICD-10-CM | POA: Diagnosis not present

## 2018-06-22 DIAGNOSIS — L821 Other seborrheic keratosis: Secondary | ICD-10-CM | POA: Diagnosis not present

## 2018-06-22 DIAGNOSIS — D229 Melanocytic nevi, unspecified: Secondary | ICD-10-CM | POA: Diagnosis not present

## 2018-06-22 DIAGNOSIS — L570 Actinic keratosis: Secondary | ICD-10-CM | POA: Diagnosis not present

## 2018-07-06 DIAGNOSIS — Z940 Kidney transplant status: Secondary | ICD-10-CM | POA: Diagnosis not present

## 2018-07-08 LAB — BASIC METABOLIC PANEL
BUN: 24 — AB (ref 4–21)
CREATININE: 1.7 — AB (ref 0.6–1.3)
Glucose: 103
Potassium: 4.4 (ref 3.4–5.3)
Sodium: 143 (ref 137–147)

## 2018-07-11 DIAGNOSIS — R739 Hyperglycemia, unspecified: Secondary | ICD-10-CM | POA: Diagnosis not present

## 2018-07-11 DIAGNOSIS — M7989 Other specified soft tissue disorders: Secondary | ICD-10-CM | POA: Diagnosis not present

## 2018-07-11 DIAGNOSIS — N186 End stage renal disease: Secondary | ICD-10-CM | POA: Diagnosis not present

## 2018-07-11 DIAGNOSIS — K922 Gastrointestinal hemorrhage, unspecified: Secondary | ICD-10-CM | POA: Diagnosis not present

## 2018-07-11 DIAGNOSIS — E669 Obesity, unspecified: Secondary | ICD-10-CM | POA: Diagnosis not present

## 2018-07-11 DIAGNOSIS — I129 Hypertensive chronic kidney disease with stage 1 through stage 4 chronic kidney disease, or unspecified chronic kidney disease: Secondary | ICD-10-CM | POA: Diagnosis not present

## 2018-07-11 DIAGNOSIS — Z940 Kidney transplant status: Secondary | ICD-10-CM | POA: Diagnosis not present

## 2018-07-11 DIAGNOSIS — M109 Gout, unspecified: Secondary | ICD-10-CM | POA: Diagnosis not present

## 2018-07-15 ENCOUNTER — Encounter: Payer: Self-pay | Admitting: Family Medicine

## 2018-07-23 ENCOUNTER — Other Ambulatory Visit: Payer: Self-pay | Admitting: Family Medicine

## 2018-07-26 ENCOUNTER — Encounter: Payer: Self-pay | Admitting: Family Medicine

## 2018-08-11 ENCOUNTER — Other Ambulatory Visit: Payer: Self-pay | Admitting: Family Medicine

## 2018-09-05 DIAGNOSIS — Z940 Kidney transplant status: Secondary | ICD-10-CM | POA: Diagnosis not present

## 2018-09-05 DIAGNOSIS — I129 Hypertensive chronic kidney disease with stage 1 through stage 4 chronic kidney disease, or unspecified chronic kidney disease: Secondary | ICD-10-CM | POA: Diagnosis not present

## 2018-09-13 ENCOUNTER — Ambulatory Visit (INDEPENDENT_AMBULATORY_CARE_PROVIDER_SITE_OTHER): Payer: Medicare HMO | Admitting: Family Medicine

## 2018-09-13 ENCOUNTER — Encounter: Payer: Self-pay | Admitting: Family Medicine

## 2018-09-13 VITALS — BP 134/62 | HR 71 | Temp 98.2°F | Resp 16 | Ht 69.0 in | Wt 201.0 lb

## 2018-09-13 DIAGNOSIS — E7800 Pure hypercholesterolemia, unspecified: Secondary | ICD-10-CM

## 2018-09-13 DIAGNOSIS — Z0000 Encounter for general adult medical examination without abnormal findings: Secondary | ICD-10-CM | POA: Diagnosis not present

## 2018-09-13 DIAGNOSIS — Z23 Encounter for immunization: Secondary | ICD-10-CM

## 2018-09-13 DIAGNOSIS — M1000 Idiopathic gout, unspecified site: Secondary | ICD-10-CM | POA: Diagnosis not present

## 2018-09-13 DIAGNOSIS — E039 Hypothyroidism, unspecified: Secondary | ICD-10-CM | POA: Diagnosis not present

## 2018-09-13 DIAGNOSIS — Z862 Personal history of diseases of the blood and blood-forming organs and certain disorders involving the immune mechanism: Secondary | ICD-10-CM

## 2018-09-13 DIAGNOSIS — I10 Essential (primary) hypertension: Secondary | ICD-10-CM | POA: Diagnosis not present

## 2018-09-13 NOTE — Addendum Note (Signed)
Addended by: Gordy Councilman on: 09/13/2018 10:08 AM   Modules accepted: Orders

## 2018-09-13 NOTE — Patient Instructions (Signed)

## 2018-09-13 NOTE — Progress Notes (Signed)
Office Note 09/13/2018  CC:  Chief Complaint  Patient presents with  . Annual Exam    HPI:  Curtis Jimenez is a 82 y.o. White male who is here for annual health maintenance exam.  Still taking iron every 3rd day, has some dark stools but no black/melena stools.  No BRBPR.  No acute complaints.    Past Medical History:  Diagnosis Date  . Anemia   . Arthritis    Lumbar multilevel degenerative dz  . Basal cell carcinoma    neck (skin MD 2X per year)  . Bipolar disorder ([place])   . BPH (benign prostatic hyperplasia)    with elevated PSA; followed by Dr. Rosana Hoes at [facility]  . Cataract   . Chronic renal insufficiency, stage III (moderate) (HCC)    Last renal f/u 07/06/18: GFR stable at 37 ml/min (Cr. 1.72).  . CVA (cerebral vascular accident) ([place]) 11/2016   Pontine (vertebrobasilar--imaging neg), TPA given.  Pt discharged on plavix.  Carotid dopplers ok, echocardiogram ok.  Residual deficit: vertigo but this improved greatly with therapy.  . Gallbladder polyp 2015   Asymptomatic  . GERD (gastroesophageal reflux disease)   . Gout   . History of adenomatous polyp of colon 2018   Recall 5 yrs  . Hyperlipidemia   . Hypertension   . Hypothyroidism   . Rectal bleeding 09/2017   Admitted for obs due to Hb drop.  GI consulted---obs recommended.  No transfusion required.  Plavix was d/c'd, ferrous sulfate recommended.  Outpt GI f/u--plavix restarted and upper endoscopy was normal and colonoscopy showed 2 polyps and severe diverticular dz.  . Restless legs syndrome   . S/p cadaver renal transplant 2013   Secondary to HTN +lithium toxicity over 30 yrs caused kidney destruction [facility] transplant MDs)  . Seborrheic dermatitis    eyebrows worst: Hytone rx'd by Dr. Denna Haggard.    Past Surgical History:  Procedure Laterality Date  . AV FISTULA PLACEMENT  04/08/2012   Procedure: ARTERIOVENOUS (AV) FISTULA CREATION;  Surgeon: Angelia Mould, MD;  Location: [facility] OR;  Service:  Vascular;  Laterality: Left;  Creation of left brachial cephalic arteriovenous fistula  . CATARACT EXTRACTION, BILATERAL  05/2018  . COLONOSCOPY  2013; 11/24/17   2013; Normal except diverticulosis (recall 10 yrs).  2018 (for GI bleeding)--severe diverticular dz + 2 adenomatous polyps.  Recall 5 yrs.  . dilation for GERD    . ESOPHAGOGASTRODUODENOSCOPY  11/24/2017   gastric polyps x 2, otherwise normal.  . KIDNEY TRANSPLANT  11/06/12   [place] (cadaveric)  . PROSTATE BIOPSY  2011   no malignancy  . TRANSTHORACIC ECHOCARDIOGRAM  11/2016   Normal LV systolic fxn, EF 83-15%.  Grade I DD.  Mild aortic root dilatation, mild MV regurg.    Family History  Problem Relation Age of Onset  . Ulcers Mother        GI bleed   . Heart disease Father   . Bladder Cancer Father   . Colon cancer Neg Hx   . Esophageal cancer Neg Hx   . Pancreatic cancer Neg Hx   . Rectal cancer Neg Hx   . Stomach cancer Neg Hx     Social History   Socioeconomic History  . Marital status: Married    Spouse name: Not on file  . Number of children: Not on file  . Years of education: Not on file  . Highest education level: Not on file  Occupational History  . Not on file  Social Needs  . Financial resource strain: Not on file  . Food insecurity:    Worry: Not on file    Inability: Not on file  . Transportation needs:    Medical: Not on file    Non-medical: Not on file  Tobacco Use  . Smoking status: Former Smoker    Years: 16.00    Types: Pipe, Cigars    Last attempt to quit: 05/11/1974    Years since quitting: 44.3  . Smokeless tobacco: Never Used  Substance and Sexual Activity  . Alcohol use: No  . Drug use: No  . Sexual activity: Not Currently    Partners: Female  Lifestyle  . Physical activity:    Days per week: Not on file    Minutes per session: Not on file  . Stress: Not on file  Relationships  . Social connections:    Talks on phone: Not on file    Gets together: Not on file     Attends religious service: Not on file    Active member of club or organization: Not on file    Attends meetings of clubs or organizations: Not on file    Relationship status: Not on file  . Intimate partner violence:    Fear of current or ex partner: Not on file    Emotionally abused: Not on file    Physically abused: Not on file    Forced sexual activity: Not on file  Other Topics Concern  . Not on file  Social History Narrative   Married, 4 children, 9 GGC, [place].   Occupation: retired Marine scientist.  Originally from [place].   Tobacco: quit 1975; smoked pipes and cigars x 15 yrs prior to this.   Alcohol: none.   Exercise: minimal, but is going to sign up for silver sneakers at the [facility].    Outpatient Medications Prior to Visit  Medication Sig Dispense Refill  . acetaminophen (TYLENOL) 325 MG tablet Take 650 mg by mouth every 6 (six) hours as needed for mild pain.    Marland Kitchen allopurinol (ZYLOPRIM) 300 MG tablet TAKE ONE TABLET BY MOUTH ONCE DAILY 90 tablet 3  . carvedilol (COREG) 12.5 MG tablet Take 1 tablet (12.5 mg total) by mouth 2 (two) times daily with a meal. 60 tablet 11  . Cholecalciferol (VITAMIN D) 2000 UNITS CAPS Take 1 capsule by mouth daily.    . clopidogrel (PLAVIX) 75 MG tablet Take 1 tablet by mouth daily.    . colchicine 0.6 MG tablet 2 tabs at onset of gout flare, then 1 tab one hour later, then starting the next day take 1 tab bid until gout flare is resolved 20 tablet 1  . Ferrous Sulfate (IRON) 325 (65 Fe) MG TABS 1 tab po three days per week 36 tablet 2  . hydrOXYzine (ATARAX/VISTARIL) 10 MG tablet Take 10 mg by mouth 2 (two) times daily as needed.     Marland Kitchen levothyroxine (SYNTHROID, LEVOTHROID) 25 MCG tablet TAKE 1 TABLET BY MOUTH ONCE DAILY 90 tablet 1  . mycophenolate (MYFORTIC) 180 MG EC tablet Take 360 mg by mouth 2 (two) times daily.     . Omega-3 1000 MG CAPS Take 2,000 mg by mouth 2 (two) times daily.     Marland Kitchen omeprazole (PRILOSEC) 20 MG capsule TAKE ONE CAPSULE BY  MOUTH ONCE DAILY 90 capsule 3  . risperiDONE (RISPERDAL) 2 MG tablet Take 2 mg by mouth at bedtime. 1/2 tab at bedtime    . simvastatin (ZOCOR) 20  MG tablet TAKE 1 TABLET BY MOUTH ONCE DAILY 90 tablet 1  . sulfamethoxazole-trimethoprim (BACTRIM,SEPTRA) 400-80 MG tablet Take 1 tablet by mouth every Monday, Wednesday, and Friday.    . tacrolimus (PROGRAF) 0.5 MG capsule Take 0.5 mg by mouth 2 (two) times daily.     . vitamin B-12 (CYANOCOBALAMIN) 100 MCG tablet Take 100 mcg by mouth daily.    . Bromfenac Sodium (PROLENSA OP) Place 1 drop into the right eye daily.    . cyclobenzaprine (FLEXERIL) 5 MG tablet 1-2 tabs po tid prn low back pain (Patient not taking: Reported on 09/13/2018) 30 tablet 1  . moxifloxacin (VIGAMOX) 0.5 % ophthalmic solution Place 1 drop into the right eye 4 (four) times daily.    . prednisoLONE acetate (PRED FORTE) 1 % ophthalmic suspension Place 1 drop into the right eye 4 (four) times daily.     No facility-administered medications prior to visit.     Allergies  Allergen Reactions  . Amantadine Hcl Itching    REACTION: itching  . Chlorpromazine Hcl     REACTION: "fell flat on face"    ROS Review of Systems  Constitutional: Negative for appetite change, chills, fatigue and fever.  HENT: Negative for congestion, dental problem, ear pain and sore throat.   Eyes: Negative for discharge, redness and visual disturbance.  Respiratory: Negative for cough, chest tightness, shortness of breath and wheezing.   Cardiovascular: Negative for chest pain, palpitations and leg swelling.  Gastrointestinal: Negative for abdominal pain, blood in stool, diarrhea, nausea and vomiting.  Genitourinary: Negative for difficulty urinating, dysuria, flank pain, frequency, hematuria and urgency.  Musculoskeletal: Negative for arthralgias, back pain, joint swelling, myalgias and neck stiffness.  Skin: Negative for pallor and rash.  Neurological: Negative for dizziness, speech difficulty,  weakness and headaches.  Hematological: Negative for adenopathy. Does not bruise/bleed easily.  Psychiatric/Behavioral: Negative for confusion and sleep disturbance. The patient is not nervous/anxious.     PE; Blood pressure 134/62, pulse 71, temperature 98.2 F (36.8 C), temperature source Oral, resp. rate 16, height 5\' 9"  (1.753 m), weight 201 lb (91.2 kg), SpO2 96 %. Gen: Alert, well appearing.  Patient is oriented to person, place, time, and situation. AFFECT: pleasant, lucid thought and speech. ENT: Ears: EACs clear, normal epithelium.  TMs with good light reflex and landmarks bilaterally.  Eyes: no injection, icteris, swelling, or exudate.  EOMI, PERRLA. Nose: no drainage or turbinate edema/swelling.  No injection or focal lesion.  Mouth: lips without lesion/swelling.  Oral mucosa pink and moist.  Dentition intact and without obvious caries or gingival swelling.  Oropharynx without erythema, exudate, or swelling.  Neck: supple/nontender.  No LAD, mass, or TM.  Carotid pulses 2+ bilaterally, without bruits. CV: RRR, no m/r/g.   LUNGS: CTA bilat, nonlabored resps, good aeration in all lung fields. ABD: soft, NT, ND, BS normal.  No hepatospenomegaly or mass.  No bruits. EXT: no clubbing, cyanosis, or edema.  Musculoskeletal: no joint swelling, erythema, warmth, or tenderness.  ROM of all joints intact. Skin - no sores or suspicious lesions or rashes or color changes Rectal exam: negative without mass, lesions or tenderness, PROSTATE EXAM: smooth and symmetric without nodules or tenderness.  Right lobe is larger than left.   Pertinent labs:  Lab Results  Component Value Date   TSH 0.64 11/05/2017   Lab Results  Component Value Date   WBC 5.0 03/14/2018   HGB 14.3 03/14/2018   HCT 44.9 03/14/2018   MCV 82.7 03/14/2018  PLT 216 03/14/2018   Lab Results  Component Value Date   CREATININE 1.7 (A) 07/08/2018   BUN 24 (A) 07/08/2018   NA 143 07/08/2018   K 4.4 07/08/2018   CL  107 03/14/2018   CO2 27 03/14/2018   Lab Results  Component Value Date   ALT 10 (L) 10/08/2017   AST 13 (L) 10/08/2017   ALKPHOS 36 (L) 10/08/2017   BILITOT 0.7 10/08/2017   Lab Results  Component Value Date   CHOL 108 11/05/2017   Lab Results  Component Value Date   HDL 45 11/05/2017   Lab Results  Component Value Date   LDLCALC 45 11/05/2017   Lab Results  Component Value Date   TRIG 94 11/05/2017   Lab Results  Component Value Date   CHOLHDL 2.4 11/05/2017   Lab Results  Component Value Date   PSA 4.2 (H) 11/05/2017   PSA 3.51 03/28/2008   PSA 4.83 (H) 12/02/2007   Lab Results  Component Value Date   HGBA1C 5.7 (H) 05/31/2017   ASSESSMENT AND PLAN:   Health maintenance exam: Reviewed age and gender appropriate health maintenance issues (prudent diet, regular exercise, health risks of tobacco and excessive alcohol, use of seatbelts, fire alarms in home, use of sunscreen).  Also reviewed age and gender appropriate health screening as well as vaccine recommendations. Vaccines: flu vaccine-->given today.   Shingrix--> holding off until further safety data is known for transplant pt's. Labs: CBC w/ferrtitin (recent hx of IDA), TSH (hypoth), CMET (CRI and HLD), FLP (HLD). Prostate ca screening: hx of elevated PSA, followed by [facility] in the past but not recent.  Last PSA tiny bit up so will repeat today to see if upgoing trend. Colon ca screening: hx of adenomatous polyps.  Next colonoscopy due 10/2022.  An After Visit Summary was printed and given to the patient.  FOLLOW UP:  Return in about 6 months (around 03/14/2019) for routine chronic illness f/u.  Signed:  Crissie Sickles, MD           09/13/2018

## 2018-09-14 LAB — COMPREHENSIVE METABOLIC PANEL
AG RATIO: 2.2 (calc) (ref 1.0–2.5)
ALT: 12 U/L (ref 9–46)
AST: 13 U/L (ref 10–35)
Albumin: 4.1 g/dL (ref 3.6–5.1)
Alkaline phosphatase (APISO): 55 U/L (ref 40–115)
BUN / CREAT RATIO: 14 (calc) (ref 6–22)
BUN: 23 mg/dL (ref 7–25)
CO2: 23 mmol/L (ref 20–32)
Calcium: 9.3 mg/dL (ref 8.6–10.3)
Chloride: 108 mmol/L (ref 98–110)
Creat: 1.63 mg/dL — ABNORMAL HIGH (ref 0.70–1.18)
GLOBULIN: 1.9 g/dL (ref 1.9–3.7)
GLUCOSE: 93 mg/dL (ref 65–99)
Potassium: 4.3 mmol/L (ref 3.5–5.3)
SODIUM: 142 mmol/L (ref 135–146)
TOTAL PROTEIN: 6 g/dL — AB (ref 6.1–8.1)
Total Bilirubin: 0.8 mg/dL (ref 0.2–1.2)

## 2018-09-14 LAB — LIPID PANEL
Cholesterol: 119 mg/dL (ref <200)
HDL: 43 mg/dL (ref >40)
LDL CHOLESTEROL (CALC): 60 mg/dL
Non-HDL Cholesterol (Calc): 76 mg/dL (calc) (ref <130)
Total CHOL/HDL Ratio: 2.8 (calc) (ref <5.0)
Triglycerides: 77 mg/dL (ref <150)

## 2018-09-14 LAB — TSH: TSH: 0.56 mIU/L (ref 0.40–4.50)

## 2018-09-14 LAB — URIC ACID: Uric Acid, Serum: 5 mg/dL (ref 4.0–8.0)

## 2018-09-14 LAB — CBC
HCT: 46.4 % (ref 38.5–50.0)
Hemoglobin: 14.7 g/dL (ref 13.2–17.1)
MCH: 28.3 pg (ref 27.0–33.0)
MCHC: 31.7 g/dL — ABNORMAL LOW (ref 32.0–36.0)
MCV: 89.2 fL (ref 80.0–100.0)
MPV: 9.4 fL (ref 7.5–12.5)
Platelets: 206 10*3/uL (ref 140–400)
RBC: 5.2 10*6/uL (ref 4.20–5.80)
RDW: 13.1 % (ref 11.0–15.0)
WBC: 6.7 10*3/uL (ref 3.8–10.8)

## 2018-09-14 LAB — FERRITIN: Ferritin: 43 ng/mL (ref 24–380)

## 2018-09-15 ENCOUNTER — Encounter: Payer: Self-pay | Admitting: *Deleted

## 2018-09-15 DIAGNOSIS — R69 Illness, unspecified: Secondary | ICD-10-CM | POA: Diagnosis not present

## 2018-09-21 DIAGNOSIS — R69 Illness, unspecified: Secondary | ICD-10-CM | POA: Diagnosis not present

## 2018-09-27 ENCOUNTER — Encounter: Payer: Self-pay | Admitting: Family Medicine

## 2018-10-03 DIAGNOSIS — Z940 Kidney transplant status: Secondary | ICD-10-CM | POA: Diagnosis not present

## 2018-10-03 DIAGNOSIS — M7989 Other specified soft tissue disorders: Secondary | ICD-10-CM | POA: Diagnosis not present

## 2018-10-03 DIAGNOSIS — K922 Gastrointestinal hemorrhage, unspecified: Secondary | ICD-10-CM | POA: Diagnosis not present

## 2018-10-03 DIAGNOSIS — E669 Obesity, unspecified: Secondary | ICD-10-CM | POA: Diagnosis not present

## 2018-10-03 DIAGNOSIS — R739 Hyperglycemia, unspecified: Secondary | ICD-10-CM | POA: Diagnosis not present

## 2018-10-03 DIAGNOSIS — M109 Gout, unspecified: Secondary | ICD-10-CM | POA: Diagnosis not present

## 2018-10-03 DIAGNOSIS — I129 Hypertensive chronic kidney disease with stage 1 through stage 4 chronic kidney disease, or unspecified chronic kidney disease: Secondary | ICD-10-CM | POA: Diagnosis not present

## 2018-10-31 ENCOUNTER — Other Ambulatory Visit: Payer: Self-pay | Admitting: Family Medicine

## 2018-10-31 NOTE — Telephone Encounter (Signed)
RF request for allopurinol LOV: 09/13/18 Next ov: 03/14/19 Last written: 11/16/17 #90 w/ 3RF  Please advise. Thanks.

## 2018-12-06 DIAGNOSIS — Z940 Kidney transplant status: Secondary | ICD-10-CM | POA: Diagnosis not present

## 2018-12-13 ENCOUNTER — Other Ambulatory Visit: Payer: Self-pay | Admitting: Dermatology

## 2018-12-13 DIAGNOSIS — L570 Actinic keratosis: Secondary | ICD-10-CM | POA: Diagnosis not present

## 2018-12-13 DIAGNOSIS — D485 Neoplasm of uncertain behavior of skin: Secondary | ICD-10-CM | POA: Diagnosis not present

## 2018-12-13 DIAGNOSIS — L820 Inflamed seborrheic keratosis: Secondary | ICD-10-CM | POA: Diagnosis not present

## 2018-12-28 DIAGNOSIS — R0609 Other forms of dyspnea: Secondary | ICD-10-CM

## 2018-12-28 DIAGNOSIS — R0600 Dyspnea, unspecified: Secondary | ICD-10-CM

## 2018-12-28 HISTORY — DX: Other forms of dyspnea: R06.09

## 2018-12-28 HISTORY — DX: Dyspnea, unspecified: R06.00

## 2019-01-30 ENCOUNTER — Other Ambulatory Visit: Payer: Self-pay | Admitting: Family Medicine

## 2019-01-31 DIAGNOSIS — I10 Essential (primary) hypertension: Secondary | ICD-10-CM | POA: Diagnosis not present

## 2019-01-31 DIAGNOSIS — E039 Hypothyroidism, unspecified: Secondary | ICD-10-CM | POA: Diagnosis not present

## 2019-01-31 DIAGNOSIS — R69 Illness, unspecified: Secondary | ICD-10-CM | POA: Diagnosis not present

## 2019-01-31 DIAGNOSIS — Z4822 Encounter for aftercare following kidney transplant: Secondary | ICD-10-CM | POA: Diagnosis not present

## 2019-01-31 DIAGNOSIS — E785 Hyperlipidemia, unspecified: Secondary | ICD-10-CM | POA: Diagnosis not present

## 2019-01-31 DIAGNOSIS — Z8673 Personal history of transient ischemic attack (TIA), and cerebral infarction without residual deficits: Secondary | ICD-10-CM | POA: Diagnosis not present

## 2019-01-31 DIAGNOSIS — Z940 Kidney transplant status: Secondary | ICD-10-CM | POA: Diagnosis not present

## 2019-01-31 DIAGNOSIS — Z79899 Other long term (current) drug therapy: Secondary | ICD-10-CM | POA: Diagnosis not present

## 2019-01-31 DIAGNOSIS — Z7902 Long term (current) use of antithrombotics/antiplatelets: Secondary | ICD-10-CM | POA: Diagnosis not present

## 2019-01-31 DIAGNOSIS — D8989 Other specified disorders involving the immune mechanism, not elsewhere classified: Secondary | ICD-10-CM | POA: Diagnosis not present

## 2019-01-31 DIAGNOSIS — Z85828 Personal history of other malignant neoplasm of skin: Secondary | ICD-10-CM | POA: Diagnosis not present

## 2019-02-03 ENCOUNTER — Other Ambulatory Visit: Payer: Self-pay | Admitting: Family Medicine

## 2019-02-09 DIAGNOSIS — E875 Hyperkalemia: Secondary | ICD-10-CM | POA: Diagnosis not present

## 2019-02-21 DIAGNOSIS — R69 Illness, unspecified: Secondary | ICD-10-CM | POA: Diagnosis not present

## 2019-02-27 ENCOUNTER — Other Ambulatory Visit: Payer: Self-pay | Admitting: Family Medicine

## 2019-03-14 ENCOUNTER — Ambulatory Visit: Payer: Medicare HMO | Admitting: Family Medicine

## 2019-03-17 ENCOUNTER — Ambulatory Visit: Payer: Medicare HMO | Admitting: Family Medicine

## 2019-03-23 ENCOUNTER — Other Ambulatory Visit: Payer: Self-pay | Admitting: Family Medicine

## 2019-03-23 ENCOUNTER — Telehealth: Payer: Self-pay | Admitting: Family Medicine

## 2019-03-23 DIAGNOSIS — D500 Iron deficiency anemia secondary to blood loss (chronic): Secondary | ICD-10-CM

## 2019-03-23 NOTE — Telephone Encounter (Signed)
Patient's last OV was 09/13/18 for CPE and labs were done also. His iron was a little elevated but no mention of needing to recheck at 6 month follow up [facility] around 02/2019.   Okay for telephone visit next week and labs later?

## 2019-03-23 NOTE — Telephone Encounter (Signed)
Lab visit only is fine: I'll enter orders. Fasting not necessary.-thx

## 2019-03-23 NOTE — Telephone Encounter (Signed)
Patient has been scheduled for lab visit in 4 weeks on 04/20/19.

## 2019-03-23 NOTE — Telephone Encounter (Signed)
Patient called to cancel appt on 03/30/19, states that he is only available to do a telephone visit. He stated that the reason he was coming in to recheck his iron. Do we need schedule telephone visit? Order for lab work?   Please advise. Contact patient with info  Thank you Hinton Dyer

## 2019-03-27 ENCOUNTER — Other Ambulatory Visit: Payer: Self-pay | Admitting: Family Medicine

## 2019-03-27 NOTE — Telephone Encounter (Signed)
Pt stated Diane advised him to call and change appt for his labs to a mon, wed or fri. Appt has been moved to Friday 4.24.20

## 2019-03-29 ENCOUNTER — Ambulatory Visit: Payer: Medicare HMO | Admitting: Family Medicine

## 2019-03-30 ENCOUNTER — Ambulatory Visit: Payer: Medicare HMO | Admitting: Family Medicine

## 2019-04-20 ENCOUNTER — Other Ambulatory Visit: Payer: Medicare HMO

## 2019-04-21 ENCOUNTER — Other Ambulatory Visit (INDEPENDENT_AMBULATORY_CARE_PROVIDER_SITE_OTHER): Payer: Medicare HMO

## 2019-04-21 ENCOUNTER — Other Ambulatory Visit: Payer: Self-pay

## 2019-04-21 DIAGNOSIS — D500 Iron deficiency anemia secondary to blood loss (chronic): Secondary | ICD-10-CM | POA: Diagnosis not present

## 2019-04-22 LAB — CBC
HCT: 46.2 % (ref 38.5–50.0)
Hemoglobin: 14.8 g/dL (ref 13.2–17.1)
MCH: 28 pg (ref 27.0–33.0)
MCHC: 32 g/dL (ref 32.0–36.0)
MCV: 87.3 fL (ref 80.0–100.0)
MPV: 9.9 fL (ref 7.5–12.5)
Platelets: 191 10*3/uL (ref 140–400)
RBC: 5.29 10*6/uL (ref 4.20–5.80)
RDW: 12.9 % (ref 11.0–15.0)
WBC: 5.5 10*3/uL (ref 3.8–10.8)

## 2019-04-22 LAB — IRON,TIBC AND FERRITIN PANEL
%SAT: 27 % (calc) (ref 20–48)
Ferritin: 79 ng/mL (ref 24–380)
Iron: 78 ug/dL (ref 50–180)
TIBC: 286 mcg/dL (calc) (ref 250–425)

## 2019-04-24 ENCOUNTER — Telehealth: Payer: Self-pay

## 2019-04-24 ENCOUNTER — Encounter: Payer: Self-pay | Admitting: Family Medicine

## 2019-04-24 NOTE — Telephone Encounter (Signed)
My Chart message sent

## 2019-04-25 ENCOUNTER — Other Ambulatory Visit: Payer: Self-pay | Admitting: Family Medicine

## 2019-05-01 DIAGNOSIS — Z940 Kidney transplant status: Secondary | ICD-10-CM | POA: Diagnosis not present

## 2019-05-01 DIAGNOSIS — I129 Hypertensive chronic kidney disease with stage 1 through stage 4 chronic kidney disease, or unspecified chronic kidney disease: Secondary | ICD-10-CM | POA: Diagnosis not present

## 2019-05-01 DIAGNOSIS — K922 Gastrointestinal hemorrhage, unspecified: Secondary | ICD-10-CM | POA: Diagnosis not present

## 2019-05-01 DIAGNOSIS — M7989 Other specified soft tissue disorders: Secondary | ICD-10-CM | POA: Diagnosis not present

## 2019-05-01 DIAGNOSIS — R739 Hyperglycemia, unspecified: Secondary | ICD-10-CM | POA: Diagnosis not present

## 2019-05-01 DIAGNOSIS — E669 Obesity, unspecified: Secondary | ICD-10-CM | POA: Diagnosis not present

## 2019-05-01 DIAGNOSIS — M109 Gout, unspecified: Secondary | ICD-10-CM | POA: Diagnosis not present

## 2019-05-09 ENCOUNTER — Encounter: Payer: Self-pay | Admitting: Family Medicine

## 2019-05-09 DIAGNOSIS — Z940 Kidney transplant status: Secondary | ICD-10-CM | POA: Diagnosis not present

## 2019-05-09 DIAGNOSIS — E875 Hyperkalemia: Secondary | ICD-10-CM | POA: Diagnosis not present

## 2019-05-09 DIAGNOSIS — M109 Gout, unspecified: Secondary | ICD-10-CM | POA: Diagnosis not present

## 2019-05-09 DIAGNOSIS — I129 Hypertensive chronic kidney disease with stage 1 through stage 4 chronic kidney disease, or unspecified chronic kidney disease: Secondary | ICD-10-CM | POA: Diagnosis not present

## 2019-05-09 LAB — HEPATIC FUNCTION PANEL
ALT: 8 — AB (ref 10–40)
AST: 9 — AB (ref 14–40)
Alkaline Phosphatase: 58 (ref 25–125)
Bilirubin, Total: 0.6

## 2019-05-09 LAB — BASIC METABOLIC PANEL
BUN: 22 — AB (ref 4–21)
Creatinine: 1.6 — AB (ref 0.6–1.3)
Glucose: 106
Potassium: 4.5 (ref 3.4–5.3)
Sodium: 143 (ref 137–147)

## 2019-05-09 LAB — TSH: TSH: 1.31 (ref 0.41–5.90)

## 2019-05-09 LAB — CBC AND DIFFERENTIAL
HCT: 45 (ref 41–53)
Hemoglobin: 15 (ref 13.5–17.5)
Neutrophils Absolute: 4
Platelets: 182 (ref 150–399)
WBC: 5.5

## 2019-05-25 ENCOUNTER — Other Ambulatory Visit: Payer: Self-pay | Admitting: Family Medicine

## 2019-05-25 NOTE — Telephone Encounter (Signed)
Patient is overdue for a follow up appointment with Dr Anitra Lauth.

## 2019-06-01 ENCOUNTER — Encounter: Payer: Self-pay | Admitting: Family Medicine

## 2019-07-06 DIAGNOSIS — R69 Illness, unspecified: Secondary | ICD-10-CM | POA: Diagnosis not present

## 2019-07-18 ENCOUNTER — Other Ambulatory Visit: Payer: Self-pay | Admitting: Family Medicine

## 2019-07-24 ENCOUNTER — Other Ambulatory Visit: Payer: Self-pay | Admitting: Family Medicine

## 2019-07-27 ENCOUNTER — Encounter: Payer: Self-pay | Admitting: Family Medicine

## 2019-07-27 ENCOUNTER — Ambulatory Visit (INDEPENDENT_AMBULATORY_CARE_PROVIDER_SITE_OTHER): Payer: Medicare HMO | Admitting: Family Medicine

## 2019-07-27 ENCOUNTER — Other Ambulatory Visit: Payer: Self-pay

## 2019-07-27 VITALS — BP 92/53 | HR 72 | Temp 98.1°F | Resp 17 | Ht 69.0 in | Wt 189.2 lb

## 2019-07-27 DIAGNOSIS — Z79899 Other long term (current) drug therapy: Secondary | ICD-10-CM | POA: Diagnosis not present

## 2019-07-27 DIAGNOSIS — E7800 Pure hypercholesterolemia, unspecified: Secondary | ICD-10-CM | POA: Diagnosis not present

## 2019-07-27 DIAGNOSIS — N183 Chronic kidney disease, stage 3 unspecified: Secondary | ICD-10-CM

## 2019-07-27 DIAGNOSIS — I10 Essential (primary) hypertension: Secondary | ICD-10-CM | POA: Diagnosis not present

## 2019-07-27 DIAGNOSIS — E663 Overweight: Secondary | ICD-10-CM

## 2019-07-27 DIAGNOSIS — Z87898 Personal history of other specified conditions: Secondary | ICD-10-CM

## 2019-07-27 MED ORDER — CARVEDILOL 12.5 MG PO TABS: ORAL_TABLET | ORAL | 3 refills | Status: DC

## 2019-07-27 NOTE — Progress Notes (Signed)
OFFICE VISIT  07/27/2019   CC:  Chief Complaint  Patient presents with  . Hypertension    took medication this morning. sometimes he checks his bp at home.    HPI:    Patient is a 83 y.o. Caucasian male who presents for f/u HTN, HLD, Hypothyroidisim, and CRI III. Most recent transplant clinic f/u was 01/31/2019.  No changes were made in mgmt at that time. Labs with Dr. Clover Mealy 05/09/19 reviewed: all stable.  Interim hx:  He is feeling well. Cataract surgery since our last o/v.    Has hx of GI bleed from diverticulum.  Hb/iron back up and no sign of bleeding.  We have stopped his iron supplement already.  He remains on plavix for hx of cva 11/2016. Taking simva qd w/out problem.  Home bp monitoring consistently in low normal range, similar to what we got here today. He tries to stay well hydrated and he avoids NSAIDs.      Past Medical History:  Diagnosis Date  . Anemia   . Arthritis    Lumbar multilevel degenerative dz  . Basal cell carcinoma    neck (skin MD 2X per year)  . Bipolar disorder ([place])   . BPH (benign prostatic hyperplasia)    with elevated PSA; followed by Dr. Rosana Hoes at [facility]  . Cataract   . Chronic renal insufficiency, stage III (moderate) (HCC)    in pt w/hx of renal transplant.  Post-transplant sCr 1.4-1.6.  Last renal f/u 05/01/19->Cr 1.63, GFR 39 ml/min  . CVA (cerebral vascular accident) ([place]) 11/2016   Pontine (vertebrobasilar--imaging neg), TPA given.  Pt discharged on plavix.  Carotid dopplers ok, echocardiogram ok.  Residual deficit: vertigo but this improved greatly with therapy.  . Gallbladder polyp 2015   Asymptomatic  . GERD (gastroesophageal reflux disease)   . Gout   . History of adenomatous polyp of colon 2018   Recall 5 yrs  . Hyperlipidemia   . Hypertension   . Hypothyroidism   . Ptosis due to aging    Right  . Rectal bleeding 09/2017   Admitted for obs due to Hb drop.  GI consulted---obs recommended.  No transfusion  required.  Plavix was d/c'd, ferrous sulfate recommended.  Outpt GI f/u--plavix restarted and upper endoscopy was normal and colonoscopy showed 2 polyps and severe diverticular dz. Iron d/c'd 04/24/19.  Marland Kitchen Restless legs syndrome   . S/p cadaver renal transplant 2013   Secondary to HTN +lithium toxicity over 30 yrs caused kidney destruction [facility] transplant MDs)  . Seborrheic dermatitis    eyebrows worst: Hytone rx'd by Dr. Denna Haggard.    Past Surgical History:  Procedure Laterality Date  . AV FISTULA PLACEMENT  04/08/2012   Procedure: ARTERIOVENOUS (AV) FISTULA CREATION;  Surgeon: Angelia Mould, MD;  Location: [facility] OR;  Service: Vascular;  Laterality: Left;  Creation of left brachial cephalic arteriovenous fistula  . CATARACT EXTRACTION, BILATERAL  05/2018  . COLONOSCOPY  2013; 11/24/17   2013; Normal except diverticulosis (recall 10 yrs).  2018 (for GI bleeding)--severe diverticular dz + 2 adenomatous polyps.  Recall 5 yrs.  . dilation for GERD    . ESOPHAGOGASTRODUODENOSCOPY  11/24/2017   gastric polyps x 2, otherwise normal.  . KIDNEY TRANSPLANT  11/06/12   [place] (cadaveric)  . PROSTATE BIOPSY  2011   no malignancy  . TRANSTHORACIC ECHOCARDIOGRAM  11/2016   Normal LV systolic fxn, EF 63-78%.  Grade I DD.  Mild aortic root dilatation, mild MV regurg.  Outpatient Medications Prior to Visit  Medication Sig Dispense Refill  . acetaminophen (TYLENOL) 325 MG tablet Take 650 mg by mouth every 6 (six) hours as needed for mild pain.    Marland Kitchen allopurinol (ZYLOPRIM) 300 MG tablet TAKE 1 TABLET BY MOUTH ONCE DAILY 90 tablet 3  . Cholecalciferol (VITAMIN D) 2000 UNITS CAPS Take 1 capsule by mouth daily.    . colchicine 0.6 MG tablet 2 tabs at onset of gout flare, then 1 tab one hour later, then starting the next day take 1 tab bid until gout flare is resolved 20 tablet 1  . hydrOXYzine (ATARAX/VISTARIL) 10 MG tablet Take 10 mg by mouth 2 (two) times daily as needed.     Marland Kitchen levothyroxine  (SYNTHROID, LEVOTHROID) 25 MCG tablet TAKE 1 TABLET BY MOUTH ONCE DAILY 90 tablet 1  . mycophenolate (MYFORTIC) 180 MG EC tablet Take 360 mg by mouth 2 (two) times daily.     . Omega-3 1000 MG CAPS Take 2,000 mg by mouth 2 (two) times daily.     Marland Kitchen omeprazole (PRILOSEC) 20 MG capsule Take 1 capsule by mouth once daily 90 capsule 0  . risperiDONE (RISPERDAL) 2 MG tablet Take 2 mg by mouth at bedtime. 1/2 tab at bedtime    . simvastatin (ZOCOR) 20 MG tablet Take 1 tablet by mouth once daily 90 tablet 0  . sulfamethoxazole-trimethoprim (BACTRIM,SEPTRA) 400-80 MG tablet Take 1 tablet by mouth every Monday, Wednesday, and Friday.    . tacrolimus (PROGRAF) 0.5 MG capsule Take 0.5 mg by mouth 2 (two) times daily.     . vitamin B-12 (CYANOCOBALAMIN) 100 MCG tablet Take 100 mcg by mouth daily.    . carvedilol (COREG) 12.5 MG tablet TAKE 1 TABLET BY MOUTH TWICE DAILY WITH MEALS 60 tablet 0  . Bromfenac Sodium (PROLENSA OP) Place 1 drop into the right eye daily.    . clopidogrel (PLAVIX) 75 MG tablet Take 1 tablet by mouth once daily 90 tablet 0  . cyclobenzaprine (FLEXERIL) 5 MG tablet 1-2 tabs po tid prn low back pain (Patient not taking: Reported on 09/13/2018) 30 tablet 1  . moxifloxacin (VIGAMOX) 0.5 % ophthalmic solution Place 1 drop into the right eye 4 (four) times daily.    . prednisoLONE acetate (PRED FORTE) 1 % ophthalmic suspension Place 1 drop into the right eye 4 (four) times daily.    . Ferrous Sulfate (IRON) 325 (65 Fe) MG TABS 1 tab po three days per week (Patient not taking: Reported on 07/27/2019) 36 tablet 2   No facility-administered medications prior to visit.     Allergies  Allergen Reactions  . Amantadine Hcl Itching    REACTION: itching  . Chlorpromazine Hcl     REACTION: "fell flat on face"    ROS As per HPI  PE: bP noted below is common for him. Blood pressure (!) 92/53, pulse 72, temperature 98.1 F (36.7 C), temperature source Temporal, resp. rate 17, height 5\' 9"   (1.753 m), weight 189 lb 4 oz (85.8 kg), SpO2 93 %. Gen: Alert, well appearing.  Patient is oriented to person, place, time, and situation. AFFECT: pleasant, lucid thought and speech. R eye ptosis -chronic/age related, not neuro deficit. CV: RRR, trace systolic and 1-2 + diastolic murmur w/out rub or gallop. Suspect murmur is the sound of his AV fistula in L arm. L arm AV fistula moderate size, skin normal.  Palpable thrill. No edema.  LABS:    Chemistry      Component  Value Date/Time   NA 143 05/09/2019   K 4.5 05/09/2019   CL 108 09/13/2018 1006   CO2 23 09/13/2018 1006   BUN 22 (A) 05/09/2019   CREATININE 1.6 (A) 05/09/2019   CREATININE 1.63 (H) 09/13/2018 1006   GLU 106 05/09/2019      Component Value Date/Time   CALCIUM 9.3 09/13/2018 1006   CALCIUM 9.5 12/06/2010 0046   ALKPHOS 58 05/09/2019   AST 9 (A) 05/09/2019   ALT 8 (A) 05/09/2019   BILITOT 0.8 09/13/2018 1006     Lab Results  Component Value Date   WBC 5.5 05/09/2019   HGB 15.0 05/09/2019   HCT 45 05/09/2019   MCV 87.3 04/21/2019   PLT 182 05/09/2019   Lab Results  Component Value Date   TSH 1.31 05/09/2019   Lab Results  Component Value Date   CHOL 119 09/13/2018   HDL 43 09/13/2018   LDLCALC 60 09/13/2018   TRIG 77 09/13/2018   CHOLHDL 2.8 09/13/2018   Lab Results  Component Value Date   HGBA1C 5.7 (H) 05/31/2017   Lab Results  Component Value Date   PSA 4.2 (H) 11/05/2017   PSA 3.51 03/28/2008   PSA 4.83 (H) 12/02/2007    IMPRESSION AND PLAN:  1) HTN: The current medical regimen is effective;  continue present plan and medications (coreg 12.5mg  tabs, 1/2 qAM and 1 qPM). Lytes/cr stable 05/09/19.  2) HLD/hx of CVA w/out residual deficit: tolerating statin.  FLP today.  Continue plavix qd.  3) CRI III, hx of renal transplant: recent transplant clinic f/u 01/2019->no changes made. Local nephrologist f/u 04/2019, no changes made. No indication for repeat labs today. Avoid NSAIDs, focus  on good hydration habits.  4) High risk med use-->risperdal (increased risk of metabolic effects); he's fasting today so we'll check FLP.  Check A1c as well.  5) Hx of elevated PSA: have considered checking repeat the last 2 visits but since he has [employer] we can't send the "medicare PSA" in our EMR orders b/c this only goes to [facility] ([employer] requires [facility] or [facility]).  If we do the "regular" PSA, he may get billed for the lab.  He chose to forego any further PSA monitoring today.  6) Hypothyroidism: TSH wnl 04/2019.  An After Visit Summary was printed and given to the patient.  FOLLOW UP: Return in about 6 months (around 01/27/2020) for annual CPE (fasting).  Signed:  Crissie Sickles, MD           07/27/2019

## 2019-07-28 LAB — LIPID PANEL
Cholesterol: 109 mg/dL (ref <200)
HDL: 39 mg/dL — ABNORMAL LOW (ref >=40)
LDL Cholesterol (Calc): 56 mg/dL (calc)
Non-HDL Cholesterol (Calc): 70 mg/dL (calc) (ref <130)
Total CHOL/HDL Ratio: 2.8 (calc) (ref <5.0)
Triglycerides: 68 mg/dL (ref <150)

## 2019-07-28 LAB — HEMOGLOBIN A1C
Hgb A1c MFr Bld: 5.7 % of total Hgb — ABNORMAL HIGH (ref <5.7)
Mean Plasma Glucose: 117 (calc)
eAG (mmol/L): 6.5 (calc)

## 2019-08-07 ENCOUNTER — Other Ambulatory Visit: Payer: Self-pay | Admitting: Family Medicine

## 2019-08-07 NOTE — Telephone Encounter (Signed)
I did not see this medication on med list but similar med was Levothyroxine 52mcg. Last written 02/03/19(90,1), pt was just seen 07/28/19.  Please advise, thanks. Medication pending

## 2019-08-09 DIAGNOSIS — H02834 Dermatochalasis of left upper eyelid: Secondary | ICD-10-CM | POA: Diagnosis not present

## 2019-08-09 DIAGNOSIS — H02831 Dermatochalasis of right upper eyelid: Secondary | ICD-10-CM | POA: Diagnosis not present

## 2019-08-09 DIAGNOSIS — H524 Presbyopia: Secondary | ICD-10-CM | POA: Diagnosis not present

## 2019-08-09 DIAGNOSIS — H43813 Vitreous degeneration, bilateral: Secondary | ICD-10-CM | POA: Diagnosis not present

## 2019-08-11 ENCOUNTER — Telehealth: Payer: Self-pay | Admitting: Family Medicine

## 2019-08-11 NOTE — Telephone Encounter (Signed)
I got a message that Sheran Spine a Software engineer from [place] on [employer] in [place] would like to change patients Levothyroxine to Euthyrox. Please advise. Thank you.

## 2019-08-11 NOTE — Telephone Encounter (Signed)
Curtis Jimenez, called from pharmacy, stating he is changing pts levothyroxine to euthyrox. Please advise.   Callback #[phone]

## 2019-08-13 NOTE — Telephone Encounter (Signed)
OK to change to euthyrox.

## 2019-08-14 ENCOUNTER — Other Ambulatory Visit: Payer: Self-pay | Admitting: Dermatology

## 2019-08-14 DIAGNOSIS — C44622 Squamous cell carcinoma of skin of right upper limb, including shoulder: Secondary | ICD-10-CM | POA: Diagnosis not present

## 2019-08-14 DIAGNOSIS — L820 Inflamed seborrheic keratosis: Secondary | ICD-10-CM | POA: Diagnosis not present

## 2019-08-14 DIAGNOSIS — L219 Seborrheic dermatitis, unspecified: Secondary | ICD-10-CM | POA: Diagnosis not present

## 2019-08-14 DIAGNOSIS — D485 Neoplasm of uncertain behavior of skin: Secondary | ICD-10-CM | POA: Diagnosis not present

## 2019-08-14 DIAGNOSIS — D229 Melanocytic nevi, unspecified: Secondary | ICD-10-CM | POA: Diagnosis not present

## 2019-08-14 DIAGNOSIS — D0461 Carcinoma in situ of skin of right upper limb, including shoulder: Secondary | ICD-10-CM

## 2019-08-14 HISTORY — DX: Carcinoma in situ of skin of right upper limb, including shoulder: D04.61

## 2019-08-16 DIAGNOSIS — R739 Hyperglycemia, unspecified: Secondary | ICD-10-CM | POA: Diagnosis not present

## 2019-08-16 DIAGNOSIS — M109 Gout, unspecified: Secondary | ICD-10-CM | POA: Diagnosis not present

## 2019-08-16 DIAGNOSIS — Z940 Kidney transplant status: Secondary | ICD-10-CM | POA: Diagnosis not present

## 2019-08-16 DIAGNOSIS — K922 Gastrointestinal hemorrhage, unspecified: Secondary | ICD-10-CM | POA: Diagnosis not present

## 2019-08-16 DIAGNOSIS — I129 Hypertensive chronic kidney disease with stage 1 through stage 4 chronic kidney disease, or unspecified chronic kidney disease: Secondary | ICD-10-CM | POA: Diagnosis not present

## 2019-08-16 DIAGNOSIS — E669 Obesity, unspecified: Secondary | ICD-10-CM | POA: Diagnosis not present

## 2019-08-16 DIAGNOSIS — R0902 Hypoxemia: Secondary | ICD-10-CM | POA: Diagnosis not present

## 2019-08-16 DIAGNOSIS — M7989 Other specified soft tissue disorders: Secondary | ICD-10-CM | POA: Diagnosis not present

## 2019-08-16 LAB — HEPATIC FUNCTION PANEL
ALT: 8 — AB (ref 10–40)
AST: 11 — AB (ref 14–40)
Alkaline Phosphatase: 54 (ref 25–125)
Bilirubin, Total: 0.5

## 2019-08-16 LAB — BASIC METABOLIC PANEL
BUN: 29 — AB (ref 4–21)
Creatinine: 1.7 — AB (ref 0.6–1.3)
Glucose: 103
Potassium: 4.7 (ref 3.4–5.3)
Sodium: 139 (ref 137–147)

## 2019-08-16 LAB — CBC AND DIFFERENTIAL
HCT: 41 (ref 41–53)
Hemoglobin: 13.8 (ref 13.5–17.5)
Neutrophils Absolute: 3
Platelets: 180 (ref 150–399)
WBC: 4.9

## 2019-08-22 ENCOUNTER — Encounter: Payer: Self-pay | Admitting: Family Medicine

## 2019-08-29 ENCOUNTER — Other Ambulatory Visit: Payer: Self-pay

## 2019-08-29 ENCOUNTER — Institutional Professional Consult (permissible substitution): Payer: Medicare HMO | Admitting: Pulmonary Disease

## 2019-08-29 ENCOUNTER — Encounter: Payer: Self-pay | Admitting: Internal Medicine

## 2019-08-29 ENCOUNTER — Ambulatory Visit (INDEPENDENT_AMBULATORY_CARE_PROVIDER_SITE_OTHER): Payer: Medicare HMO

## 2019-08-29 ENCOUNTER — Ambulatory Visit: Payer: Medicare HMO | Admitting: Internal Medicine

## 2019-08-29 DIAGNOSIS — R0600 Dyspnea, unspecified: Secondary | ICD-10-CM

## 2019-08-29 DIAGNOSIS — R0609 Other forms of dyspnea: Secondary | ICD-10-CM | POA: Diagnosis not present

## 2019-08-29 DIAGNOSIS — J8410 Pulmonary fibrosis, unspecified: Secondary | ICD-10-CM

## 2019-08-29 LAB — BASIC METABOLIC PANEL
BUN: 28 mg/dL — ABNORMAL HIGH (ref 6–23)
CO2: 26 mEq/L (ref 19–32)
Calcium: 9.3 mg/dL (ref 8.4–10.5)
Chloride: 108 mEq/L (ref 96–112)
Creatinine, Ser: 1.67 mg/dL — ABNORMAL HIGH (ref 0.40–1.50)
GFR: 39.71 mL/min — ABNORMAL LOW (ref >60.00)
Glucose, Bld: 98 mg/dL (ref 70–99)
Potassium: 4.3 mEq/L (ref 3.5–5.1)
Sodium: 142 mEq/L (ref 135–145)

## 2019-08-29 LAB — BRAIN NATRIURETIC PEPTIDE: Pro B Natriuretic peptide (BNP): 262 pg/mL — ABNORMAL HIGH (ref 0.0–100.0)

## 2019-08-29 LAB — D-DIMER, QUANTITATIVE: D-Dimer, Quant: 0.54 ug{FEU}/mL — ABNORMAL HIGH

## 2019-08-29 NOTE — Progress Notes (Signed)
Curtis Jimenez, male    DOB: 08/22/1936      MRN: [id number]   Brief patient profile:  36 yowm from [place] quit smoking 04/1974 s/p renal transplant x 2013 with new sense of tired legs since then but unalble to pinpoint when, affects both legs and has not progressed and concerned with low sats at the doctor's office but denies doe so referred to pulmonary clinic 08/29/2019 by Dr Rolan Lipa.     History of Present Illness  08/29/2019  Pulmonary/ 1st office eval/Torria Fromer  Chief Complaint  Patient presents with  . Pulmonary Consult    Referred by Corliss Parish. Pt states he can only walk 25-50 yards before his legs get tired. He denies any respiratory co's.   Dyspnea:  Shopped at [employer] the cart until March 2020 s symptoms sometimes but not always used [facility] parking / walks at [employer] once a week x 5 min stops for 10 sec then keeps going total of up to 30 min but "if hyperventilate can go up to 100 yards s stopping"  Cough: no Sleep: able to lie flat /one pillow doe not feel rested but not related to any obvious  resp symptoms  SABA use: none  No obvious day to day or daytime variability or assoc excess/ purulent sputum or mucus plugs or hemoptysis or cp or chest tightness, subjective wheeze or overt sinus or hb symptoms.   Sleeping  without nocturnal  or early am exacerbation  of respiratory  c/o's or need for noct saba. Also denies any obvious fluctuation of symptoms with weather or environmental changes or other aggravating or alleviating factors except as outlined above   No unusual exposure hx or h/o childhood pna/ asthma or knowledge of premature birth.  Current Allergies, Complete Past Medical History, Past Surgical History, Family History, and Social History were reviewed in [employer] record.  ROS  The following are not active complaints unless bolded Hoarseness, sore throat, dysphagia, dental problems, itching, sneezing,  nasal  congestion or discharge of excess mucus or purulent secretions, ear ache,   fever, chills, sweats, unintended wt loss or wt gain, classically pleuritic or exertional cp,  orthopnea pnd or arm/hand swelling  or leg swelling, presyncope, palpitations, abdominal pain, anorexia, nausea, vomiting, diarrhea  or change in bowel habits or change in bladder habits, change in stools or change in urine, dysuria, hematuria,  rash, arthralgias, visual complaints, headache, numbness, weakness or ataxia or problems with walking or coordination,  change in mood or  memory.           Past Medical History:  Diagnosis Date  . Anemia   . Arthritis    Lumbar multilevel degenerative dz  . Basal cell carcinoma    neck (skin MD 2X per year)  . Bipolar disorder ([place])   . BPH (benign prostatic hyperplasia)    with elevated PSA; followed by Dr. Rosana Hoes at [facility]  . Cataract   . Chronic renal insufficiency, stage III (moderate) (HCC)    in pt w/hx of renal transplant.  Post-transplant sCr 1.4-1.6.  Last renal f/u 05/01/19->Cr 1.63, GFR 39 ml/min  . 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  Pt discharged on plavix.  Carotid dopplers ok, echocardiogram ok.  Residual deficit: vertigo but this improved greatly with therapy.  . Gallbladder polyp 2015   Asymptomatic  . GERD (gastroesophageal reflux disease)   . Gout   . History of  adenomatous polyp of colon 2018   Recall 5 yrs  . Hyperlipidemia   . Hypertension   . Hypothyroidism   . Ptosis due to aging    Right  . Rectal bleeding 09/2017   Admitted for obs due to Hb drop.  GI consulted---obs recommended.  No transfusion required.  Plavix was d/c'd, ferrous sulfate recommended.  Outpt GI f/u--plavix restarted and upper endoscopy was normal and colonoscopy showed 2 polyps and severe diverticular dz. Iron d/c'd 04/24/19.  Marland Kitchen Restless legs syndrome   . S/p cadaver renal transplant 2013   Secondary to HTN  +lithium toxicity over 30 yrs caused kidney destruction [facility] transplant MDs)  . Seborrheic dermatitis    eyebrows worst: Hytone rx'd by Dr. Denna Haggard.    Outpatient Medications Prior to Visit  Medication Sig Dispense Refill  . acetaminophen (TYLENOL) 325 MG tablet Take 650 mg by mouth every 6 (six) hours as needed for mild pain.    Marland Kitchen allopurinol (ZYLOPRIM) 300 MG tablet TAKE 1 TABLET BY MOUTH ONCE DAILY 90 tablet 3  . carvedilol (COREG) 12.5 MG tablet 1/2 tab po qAM and 1 tab po qPM 135 tablet 3  . Cholecalciferol (VITAMIN D) 2000 UNITS CAPS Take 1 capsule by mouth daily.    . clopidogrel (PLAVIX) 75 MG tablet Take 1 tablet by mouth once daily 90 tablet 0  . colchicine 0.6 MG tablet 2 tabs at onset of gout flare, then 1 tab one hour later, then starting the next day take 1 tab bid until gout flare is resolved 20 tablet 1  . EUTHYROX 25 MCG tablet Take 1 tablet by mouth once daily 90 tablet 3  . hydrOXYzine (ATARAX/VISTARIL) 10 MG tablet Take 10 mg by mouth 2 (two) times daily as needed.     . mycophenolate (MYFORTIC) 180 MG EC tablet Take 360 mg by mouth 2 (two) times daily.     . Omega-3 1000 MG CAPS Take 2,000 mg by mouth 2 (two) times daily.     Marland Kitchen omeprazole (PRILOSEC) 20 MG capsule Take 1 capsule by mouth once daily 90 capsule 0  . risperiDONE (RISPERDAL) 2 MG tablet Take 2 mg by mouth at bedtime. 1/2 tab at bedtime    . simvastatin (ZOCOR) 20 MG tablet Take 1 tablet by mouth once daily 90 tablet 0  . sulfamethoxazole-trimethoprim (BACTRIM,SEPTRA) 400-80 MG tablet Take 1 tablet by mouth every Monday, Wednesday, and Friday.    . tacrolimus (PROGRAF) 0.5 MG capsule Take 0.5 mg by mouth 2 (two) times daily.     . vitamin B-12 (CYANOCOBALAMIN) 100 MCG tablet Take 100 mcg by mouth daily.    . Bromfenac Sodium (PROLENSA OP) Place 1 drop into the right eye daily.    . cyclobenzaprine (FLEXERIL) 5 MG tablet 1-2 tabs po tid prn low back pain (Patient not taking: Reported on 09/13/2018) 30 tablet 1   . moxifloxacin (VIGAMOX) 0.5 % ophthalmic solution Place 1 drop into the right eye 4 (four) times daily.    . prednisoLONE acetate (PRED FORTE) 1 % ophthalmic suspension Place 1 drop into the right eye 4 (four) times daily.        Objective:     BP 118/60 (BP Location: Left Arm, Cuff Size: Normal)   Pulse 75   Temp 97.9 F (36.6 C) (Oral)   Ht 5\' 9"  (1.753 m)   Wt 187 lb (84.8 kg)   SpO2 95% Comment: on RA  BMI 27.62 kg/m   SpO2: 95 %(on RA)  HEENT : pt wearing mask not removed for exam due to covid - 19 concerns.    NECK :  without JVD/Nodes/TM/ nl carotid upstrokes bilaterally   LUNGS: no acc muscle use,  Nl contour chest  With a few insp crackles in bases  bilaterally without cough on insp or exp maneuvers   CV:  RRR  no s3 or murmur or increase in P2, and no edema   ABD:  soft and nontender with nl inspiratory excursion in the supine position. No bruits or organomegaly appreciated, bowel sounds nl  MS:  Nl gait/ ext warm without deformities, calf tenderness, cyanosis or clubbing No obvious joint restrictions   SKIN: warm and dry without lesions    NEURO:  alert, approp, nl sensorium with  no motor or cerebellar deficits apparent.    CXR PA and Lateral:   08/29/2019 :    I personally reviewed images and agree with radiology impression as follows:   Accentuated interstitial opacities, possibly acute interstitial inflammatory process. No focal airspace disease.   Labs ordered/ reviewed:      Chemistry      Component Value Date/Time   NA 142 08/29/2019 1142   NA 139 08/16/2019   K 4.3 08/29/2019 1142   CL 108 08/29/2019 1142   CO2 26 08/29/2019 1142   BUN 28 (H) 08/29/2019 1142   BUN 29 (A) 08/16/2019   CREATININE 1.67 (H) 08/29/2019 1142   CREATININE 1.63 (H) 09/13/2018 1006   GLU 103 08/16/2019      Component Value Date/Time   CALCIUM 9.3 08/29/2019 1142   CALCIUM 9.5 12/06/2010 0046   ALKPHOS 54 08/16/2019   AST 11 (A) 08/16/2019   ALT 8 (A)  08/16/2019   BILITOT 0.8 09/13/2018 1006        Lab Results  Component Value Date   WBC 4.9 08/16/2019   HGB 13.8 08/16/2019   HCT 41 08/16/2019   MCV 87.3 04/21/2019   PLT 180 08/16/2019     Lab Results  Component Value Date   DDIMER 0.54 (H) 08/29/2019      Lab Results  Component Value Date   TSH 1.31 05/09/2019     Lab Results  Component Value Date   PROBNP 262.0 (H) 08/29/2019            Assessment   DOE (dyspnea on exertion) Onset  Not clear ? 2013 more tired than sob - 08/29/2019   Walked RA  2 laps @  approx 268ft each @ fast pace  stopped due to  Breathing felt a little heavy but sats 99% at end   - CXR 08/29/2019 c/w ? ILD > HRCT ordered.  Overall Symptoms are disproportionate to objective findings and not clear to what extent this is actually a pulmonary  Problem (could have mild ild but doubt it's really limiting - see sep a/p)  but pt does appear to have difficult to sort out respiratory symptoms of unknown origin for which  DDX  = almost all start with A and  include Adherence, Ace Inhibitors, Acid Reflux, Active Sinus Disease, Alpha 1 Antitripsin deficiency, Anxiety masquerading as Airways dz,  ABPA,  Allergy(esp in young), Aspiration (esp in elderly), Adverse effects of meds,  Active smoking or Vaping, A bunch of PE's/clot burden (a few small clots can't cause this syndrome unless there is already severe underlying pulm or vascular dz with poor reserve),  Anemia or thyroid disorder, plus two Bs  = Bronchiectasis and Beta blocker use..and one  C= CHF    Adherence is always the initial "prime suspect" and is a multilayered concern that requires a "trust but verify" approach in every patient - starting with knowing how to use medications, especially inhalers, correctly, keeping up with refills and understanding the fundamental difference between maintenance and prns vs those medications only taken for a very short course and then stopped and not refilled.   ?  Allergy /asthma > doesn't fit, fixed pattern s cough or noct symptoms or assoc rhintis  ? Anxiety/depression/ deconditioning > usually at the bottom of this list of usual suspects but should be  higher on this pt's based on H and P and note already on psychotropics and may interfere with adherence and also interpretation of response or lack thereof to symptom management which can be quite subjective.   ? Adverse drug effects > none of the usual suspects listed  ? Anemia, thyroid dz > ruled out today   ? A bunch PE > D dimer nl - while a normal  or high normal value (seen commonly in the elderly or chronically ill)  may miss small peripheral pe, the clot burden with sob is moderately high and the d dimer  has a very high neg pred value if used in this setting.    ? chf >  Intermediate bnp noted, may need repeat echo/ cpst if not clear        Postinflammatory pulmonary fibrosis ([place]) No desats walking 08/29/2019  - HRCT ordered  I am underwhelmed by exam and cxr but need to complete the w/u with HRCT and if pos do w/u for collagen vasc dz/ hsp  Discussed in detail all the  indications, usual  risks and alternatives  relative to the benefits with patient who agrees to proceed with w/u  as outlined .      Total time devoted to counseling  > 50 % of initial 60 min office visit:  reviewed case with pt/  directly observed portions of ambulatory 02 saturation study/ discussion of options/alternatives/ personally creating written customized instructions  in presence of pt  then going over those specific  Instructions directly with the pt including how to use all of the meds but in particular covering each new medication in detail and the difference between the maintenance= "automatic" meds and the prns using an action plan format for the latter (If this problem/symptom => do that organization reading Left to right).  Please see AVS from this visit for a full list of these instructions which I  personally wrote for this pt and  are unique to this visit.   Christinia Gully, MD

## 2019-08-29 NOTE — Patient Instructions (Addendum)
To get the most out of exercise, you need to be continuously aware that you are short of breath, but never out of breath, for 30 minutes daily. As you improve, it will actually be easier for you to do the same amount of exercise  in  30 minutes so always push to the level where you are short of breath.     If not improving after about a month to your satisfaction, call for additional testing which might include either a cardiac stress test or a CPST   Please remember to go to the lab and x-ray department   for your tests - we will call you with the results when they are available.

## 2019-08-30 ENCOUNTER — Other Ambulatory Visit: Payer: Self-pay | Admitting: Internal Medicine

## 2019-08-30 ENCOUNTER — Telehealth: Payer: Self-pay | Admitting: Internal Medicine

## 2019-08-30 ENCOUNTER — Encounter: Payer: Self-pay | Admitting: Internal Medicine

## 2019-08-30 DIAGNOSIS — R0602 Shortness of breath: Secondary | ICD-10-CM

## 2019-08-30 DIAGNOSIS — J8410 Pulmonary fibrosis, unspecified: Secondary | ICD-10-CM | POA: Insufficient documentation

## 2019-08-30 NOTE — Telephone Encounter (Signed)
HRCT does not need or use contrast for study

## 2019-08-30 NOTE — Progress Notes (Signed)
Spoke with pt and notified of results per Dr. Wert. Pt verbalized understanding and denied any questions. 

## 2019-08-30 NOTE — Assessment & Plan Note (Signed)
Onset  Not clear ? 2013 more tired than sob - 08/29/2019   Walked RA  2 laps @  approx 279ft each @ fast pace  stopped due to  Breathing felt a little heavy but sats 99% at end   - CXR 08/29/2019 c/w ? ILD > HRCT ordered.  Overall Symptoms are disproportionate to objective findings and not clear to what extent this is actually a pulmonary  Problem (could have mild ild but doubt it's really limiting - see sep a/p)  but pt does appear to have difficult to sort out respiratory symptoms of unknown origin for which  DDX  = almost all start with A and  include Adherence, Ace Inhibitors, Acid Reflux, Active Sinus Disease, Alpha 1 Antitripsin deficiency, Anxiety masquerading as Airways dz,  ABPA,  Allergy(esp in young), Aspiration (esp in elderly), Adverse effects of meds,  Active smoking or Vaping, A bunch of PE's/clot burden (a few small clots can't cause this syndrome unless there is already severe underlying pulm or vascular dz with poor reserve),  Anemia or thyroid disorder, plus two Bs  = Bronchiectasis and Beta blocker use..and one C= CHF    Adherence is always the initial "prime suspect" and is a multilayered concern that requires a "trust but verify" approach in every patient - starting with knowing how to use medications, especially inhalers, correctly, keeping up with refills and understanding the fundamental difference between maintenance and prns vs those medications only taken for a very short course and then stopped and not refilled.   ? Allergy /asthma > doesn't fit, fixed pattern s cough or noct symptoms or assoc rhintis  ? Anxiety/depression/ deconditioning > usually at the bottom of this list of usual suspects but should be  higher on this pt's based on H and P and note already on psychotropics and may interfere with adherence and also interpretation of response or lack thereof to symptom management which can be quite subjective.   ? Adverse drug effects > none of the usual suspects listed  ?  Anemia, thyroid dz > ruled out today   ? A bunch PE > D dimer nl - while a normal  or high normal value (seen commonly in the elderly or chronically ill)  may miss small peripheral pe, the clot burden with sob is moderately high and the d dimer  has a very high neg pred value if used in this setting.    ? chf >  Intermediate bnp noted, may need repeat echo/ cpst if not clear

## 2019-08-30 NOTE — Telephone Encounter (Signed)
Patient reports he scheduled for a HRCT but he cannot have contrast because he had a kidney transplant 7 years ago. I made him aware I would discuss with MW and get back with him. Voiced understanding.   MW please advise. Thanks.

## 2019-08-30 NOTE — Assessment & Plan Note (Signed)
No desats walking 08/29/2019  - HRCT ordered  I am underwhelmed by exam and cxr but need to complete the w/u with HRCT and if pos do w/u for collagen vasc dz/ hsp  Discussed in detail all the  indications, usual  risks and alternatives  relative to the benefits with patient who agrees to proceed with w/u  as outlined .   Total time devoted to counseling  > 50 % of initial 60 min office visit:  reviewed case with pt/  directly observed portions of ambulatory 02 saturation study/ discussion of options/alternatives/ personally creating written customized instructions  in presence of pt  then going over those specific  Instructions directly with the pt including how to use all of the meds but in particular covering each new medication in detail and the difference between the maintenance= "automatic" meds and the prns using an action plan format for the latter (If this problem/symptom => do that organization reading Left to right).  Please see AVS from this visit for a full list of these instructions which I personally wrote for this pt and  are unique to this visit.

## 2019-08-30 NOTE — Telephone Encounter (Signed)
Returned call to patient and reviewed Dr. Gustavus Bryant comments regarding the HRCT and no contrast would be used.  Patient acknowledged understanding and had no further questions.  Nothing further needed.

## 2019-08-31 ENCOUNTER — Encounter: Payer: Self-pay | Admitting: Family Medicine

## 2019-09-15 ENCOUNTER — Ambulatory Visit (INDEPENDENT_AMBULATORY_CARE_PROVIDER_SITE_OTHER): Payer: Medicare HMO

## 2019-09-15 ENCOUNTER — Other Ambulatory Visit: Payer: Self-pay

## 2019-09-15 DIAGNOSIS — Z23 Encounter for immunization: Secondary | ICD-10-CM | POA: Diagnosis not present

## 2019-09-20 ENCOUNTER — Ambulatory Visit: Payer: Medicare HMO | Admitting: Psychiatry

## 2019-09-20 ENCOUNTER — Other Ambulatory Visit: Payer: Self-pay

## 2019-09-26 ENCOUNTER — Ambulatory Visit (INDEPENDENT_AMBULATORY_CARE_PROVIDER_SITE_OTHER)
Admission: RE | Admit: 2019-09-26 | Discharge: 2019-09-26 | Disposition: A | Discharge status: Home / Self Care | Payer: Medicare HMO | Source: Ambulatory Visit | Attending: Internal Medicine | Admitting: Internal Medicine

## 2019-09-26 ENCOUNTER — Other Ambulatory Visit: Payer: Self-pay

## 2019-09-26 DIAGNOSIS — R0602 Shortness of breath: Secondary | ICD-10-CM | POA: Diagnosis not present

## 2019-09-27 ENCOUNTER — Other Ambulatory Visit: Payer: Self-pay | Admitting: Family Medicine

## 2019-09-28 ENCOUNTER — Telehealth: Payer: Self-pay | Admitting: Internal Medicine

## 2019-09-28 NOTE — Telephone Encounter (Signed)
Pt returne call mad appot for 4-6wk.Curtis Jimenez

## 2019-09-28 NOTE — Telephone Encounter (Signed)
Call returned to patient, confirmed DOB, made aware of results per MW. Voiced understanding. Nothing further needed at this time.

## 2019-09-28 NOTE — Telephone Encounter (Signed)
LMTCB  Notes recorded by Tanda Rockers, MD on 09/26/2019 at 3:53 PM EDT  Call patient : Study is negative for significant fibrosis, no explanation for symptoms, needs ov in 4-6 weeks to regroup going forward with a plan to monitor

## 2019-09-29 NOTE — Progress Notes (Signed)
Pt aware of results and plan to f/u on 10/28 as scheduled. Nothing further needed.

## 2019-10-01 ENCOUNTER — Encounter: Payer: Self-pay | Admitting: Family Medicine

## 2019-10-04 ENCOUNTER — Encounter: Payer: Self-pay | Admitting: *Deleted

## 2019-10-16 ENCOUNTER — Other Ambulatory Visit: Payer: Self-pay | Admitting: Family Medicine

## 2019-10-17 ENCOUNTER — Encounter: Payer: Self-pay | Admitting: Psychiatry

## 2019-10-17 ENCOUNTER — Ambulatory Visit (INDEPENDENT_AMBULATORY_CARE_PROVIDER_SITE_OTHER): Payer: Medicare HMO | Admitting: Psychiatry

## 2019-10-17 ENCOUNTER — Other Ambulatory Visit: Payer: Self-pay

## 2019-10-17 DIAGNOSIS — F319 Bipolar disorder, unspecified: Secondary | ICD-10-CM

## 2019-10-17 DIAGNOSIS — F5105 Insomnia due to other mental disorder: Secondary | ICD-10-CM

## 2019-10-17 DIAGNOSIS — R69 Illness, unspecified: Secondary | ICD-10-CM | POA: Diagnosis not present

## 2019-10-17 NOTE — Patient Instructions (Signed)
Melatonin 5 mg 30 min before sleep

## 2019-10-17 NOTE — Progress Notes (Signed)
Curtis Jimenez [id number] [birth year] 83 y.o.  Subjective:   Patient ID:  Curtis Jimenez is a 83 y.o. (DOB [birth year]) male.  Chief Complaint:  Chief Complaint  Patient presents with  . Follow-up    Medication Management  . Other    Bipolar    HPI Curtis Jimenez presents to the office today for follow-up of bipolar and sleep.  Last seen September 2019.  Continued on risperidone 1 mg HS which was reduced from 2 mg previously.  2-3 times weekly initial insomnia.  Other days sleep plenty of hours and naps 2 hours.  2 days ago skipped naps and still couldn't go to sleep.  Asks about melatonin.  Patient reports stable mood and denies depressed or irritable moods.  Patient denies any recent difficulty with anxiety.  Patient denies difficulty with sleep initiation or maintenance. Denies appetite disturbance.  Patient reports that energy and motivation have been good.  Patient denies any difficulty with concentration.  Patient denies any suicidal ideation.  Past Psychiatric Medication Trials: Lithium, VPA, olanzapine, lamotrigine, clonazepam, risperidone, Geodon  Review of Systems:  Review of Systems  Respiratory: Positive for shortness of breath.   Neurological: Negative for tremors and weakness.    Medications: I have reviewed the patient's current medications.  Current Outpatient Medications  Medication Sig Dispense Refill  . acetaminophen (TYLENOL) 325 MG tablet Take 650 mg by mouth every 6 (six) hours as needed for mild pain.    Marland Kitchen allopurinol (ZYLOPRIM) 300 MG tablet TAKE 1 TABLET BY MOUTH ONCE DAILY 90 tablet 3  . carvedilol (COREG) 12.5 MG tablet 1/2 tab po qAM and 1 tab po qPM 135 tablet 3  . Cholecalciferol (VITAMIN D) 2000 UNITS CAPS Take 1 capsule by mouth daily.    . clopidogrel (PLAVIX) 75 MG tablet Take 1 tablet by mouth once daily 90 tablet 0  . colchicine 0.6 MG tablet 2 tabs at onset of gout flare, then 1 tab one hour later, then starting the next day take  1 tab bid until gout flare is resolved 20 tablet 1  . EUTHYROX 25 MCG tablet Take 1 tablet by mouth once daily 90 tablet 3  . hydrOXYzine (ATARAX/VISTARIL) 10 MG tablet Take 10 mg by mouth 2 (two) times daily as needed.     . mycophenolate (MYFORTIC) 180 MG EC tablet Take 360 mg by mouth 2 (two) times daily.     . Omega-3 1000 MG CAPS Take 2,000 mg by mouth 2 (two) times daily.     Marland Kitchen omeprazole (PRILOSEC) 20 MG capsule Take 1 capsule by mouth once daily 90 capsule 1  . risperiDONE (RISPERDAL) 2 MG tablet Take 2 mg by mouth at bedtime. 1/2 tab at bedtime    . simvastatin (ZOCOR) 20 MG tablet Take 1 tablet by mouth once daily 90 tablet 0  . sulfamethoxazole-trimethoprim (BACTRIM,SEPTRA) 400-80 MG tablet Take 1 tablet by mouth every Monday, Wednesday, and Friday.    . tacrolimus (PROGRAF) 0.5 MG capsule Take 0.5 mg by mouth 2 (two) times daily.     . vitamin B-12 (CYANOCOBALAMIN) 100 MCG tablet Take 100 mcg by mouth daily.     No current facility-administered medications for this visit.     Medication Side Effects: None  Allergies:  Allergies  Allergen Reactions  . Amantadine Hcl Itching    REACTION: itching  . Chlorpromazine Hcl     REACTION: "fell flat on face"    Past Medical History:  Diagnosis Date  . Anemia   .  Basal cell carcinoma    neck (skin MD 2X per year)  . Bipolar disorder ([place])   . BPH (benign prostatic hyperplasia)    with elevated PSA; followed by Dr. Rosana Hoes at [facility]  . Chronic renal insufficiency, stage III (moderate)    in pt w/hx of renal transplant.  Post-transplant sCr 1.4-1.6.  Last renal f/u 05/01/19->Cr 1.63, GFR 39 ml/min  . Coronary atherosclerosis    LM and 3 V dz noted on CT 08/2019 performed for eval of interstitial lung dz in the setting of mild DOE and mild hypoxia.  Marland Kitchen CVA (cerebral vascular accident) ([place]) 11/2016   Pontine (vertebrobasilar--imaging neg), TPA given.  Pt discharged on plavix.  Carotid dopplers ok, echocardiogram ok.  Residual  deficit: vertigo but this improved greatly with therapy.  . Gallbladder polyp 2015   Asymptomatic  . GERD (gastroesophageal reflux disease)   . Gout   . History of adenomatous polyp of colon 2018   Recall 5 yrs  . Hyperlipidemia   . Hypertension   . Hypothyroidism   . Interstitial lung disease ([place])    DOE and ? hypoxia->CXR 08/29/19--->diffuse interstitial opacities-? acute inflammatory process->Dr. Melvyn Novas eval'd him and was underwhelmed by the CXR and exam->CT chest 09/26/19 "Very subtle areas of mild ground-glass attenuatio-nonspecific", mild air trapping.  . Lumbar spondylosis   . Ptosis due to aging    Right  . Rectal bleeding 09/2017   Admitted for obs due to Hb drop.  GI consulted---obs recommended.  No transfusion required.  Plavix was d/c'd, ferrous sulfate recommended.  Outpt GI f/u--plavix restarted and upper endoscopy was normal and colonoscopy showed 2 polyps and severe diverticular dz. Iron d/c'd 04/24/19.  Marland Kitchen Restless legs syndrome   . S/p cadaver renal transplant 2013   Secondary to HTN +lithium toxicity over 30 yrs caused kidney destruction [facility] transplant MDs)  . Seborrheic dermatitis    eyebrows worst: Hytone rx'd by Dr. Denna Haggard.  . 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 Ulcers Mother        GI bleed   . Heart disease Father   . Bladder Cancer Father   . Colon cancer Neg Hx   . Esophageal cancer Neg Hx   . Pancreatic cancer Neg Hx   . Rectal cancer Neg Hx   . Stomach cancer Neg Hx     Social History   Socioeconomic History  . Marital status: Married    Spouse name: Not on file  . Number of children: Not on file  . Years of education: Not on file  . Highest education level: Not on file  Occupational History  . Not on file  Social  Needs  . Financial resource strain: Not on file  . Food insecurity    Worry: Not on file    Inability: Not on file  . Transportation needs    Medical: Not on file    Non-medical: Not on file  Tobacco Use  . Smoking status: Former Smoker    Years: 16.00    Types: Pipe, Cigars    Quit date: 05/11/1974    Years since quitting: 45.4  . Smokeless tobacco: Never Used  Substance and Sexual Activity  . Alcohol use: No  . Drug use: No  . Sexual activity:  Not Currently    Partners: Female  Lifestyle  . Physical activity    Days per week: Not on file    Minutes per session: Not on file  . Stress: Not on file  Relationships  . Social Herbalist on phone: Not on file    Gets together: Not on file    Attends religious service: Not on file    Active member of club or organization: Not on file    Attends meetings of clubs or organizations: Not on file    Relationship status: Not on file  . Intimate partner violence    Fear of current or ex partner: Not on file    Emotionally abused: Not on file    Physically abused: Not on file    Forced sexual activity: Not on file  Other Topics Concern  . Not on file  Social History Narrative   Married, 4 children, 9 GGC, [place].   Occupation: retired Marine scientist.  Originally from [place].   Tobacco: quit 1975; smoked pipes and cigars x 15 yrs prior to this.   Alcohol: none.   Exercise: minimal, but is going to sign up for silver sneakers at the [facility]    Past Medical History, Surgical history, Social history, and Family history were reviewed and updated as appropriate.   Please see review of systems for further details on the patient's review from today.   Objective:   Physical Exam:  There were no vitals taken for this visit.  Physical Exam Constitutional:      General: He is not in acute distress.    Appearance: He is well-developed.  Musculoskeletal:        General: No deformity.  Neurological:     Mental Status: He is  alert and oriented to person, place, and time.     Coordination: Coordination normal.  Psychiatric:        Attention and Perception: Attention and perception normal. He does not perceive auditory or visual hallucinations.        Mood and Affect: Mood normal. Mood is not anxious or depressed. Affect is not labile, blunt, angry or inappropriate.        Speech: Speech normal.        Behavior: Behavior normal.        Thought Content: Thought content normal. Thought content does not include homicidal or suicidal ideation. Thought content does not include homicidal or suicidal plan.        Cognition and Memory: Cognition and memory normal.        Judgment: Judgment normal.     Comments: Insight intact. No delusions.  No mania.     Lab Review:     Component Value Date/Time   NA 142 08/29/2019 1142   NA 139 08/16/2019   K 4.3 08/29/2019 1142   CL 108 08/29/2019 1142   CO2 26 08/29/2019 1142   GLUCOSE 98 08/29/2019 1142   GLUCOSE 103 (H) 12/15/2006 1045   BUN 28 (H) 08/29/2019 1142   BUN 29 (A) 08/16/2019   CREATININE 1.67 (H) 08/29/2019 1142   CREATININE 1.63 (H) 09/13/2018 1006   CALCIUM 9.3 08/29/2019 1142   CALCIUM 9.5 12/06/2010 0046   PROT 6.0 (L) 09/13/2018 1006   ALBUMIN 2.5 (L) 10/08/2017 0305   AST 11 (A) 08/16/2019   ALT 8 (A) 08/16/2019   ALKPHOS 54 08/16/2019   BILITOT 0.8 09/13/2018 1006   GFRNONAA 38 (L) 10/09/2017 0431   GFRAA 44 (L) 10/09/2017  0431       Component Value Date/Time   WBC 4.9 08/16/2019   WBC 5.5 04/21/2019 1006   RBC 5.29 04/21/2019 1006   HGB 13.8 08/16/2019   HCT 41 08/16/2019   PLT 180 08/16/2019   MCV 87.3 04/21/2019 1006   MCH 28.0 04/21/2019 1006   MCHC 32.0 04/21/2019 1006   RDW 12.9 04/21/2019 1006   LYMPHSABS 715 (L) 03/14/2018 1420   MONOABS 0.6 10/12/2017 1043   EOSABS 120 03/14/2018 1420   BASOSABS 50 03/14/2018 1420    No results found for: POCLITH, LITHIUM   No results found for: PHENYTOIN, PHENOBARB, VALPROATE, CBMZ    .res Assessment: Plan:    Curtis Jimenez was seen today for follow-up and other.  Diagnoses and all orders for this visit:  Bipolar I disorder ([place])  Insomnia due to mental condition    Patient has a history of kidney transplant.  He has been stable on risperidone for several years for bipolar disorder.  He has had previous manic episodes but none in several years.  We have reduced the risperidone to the lowest effective dose.  As noted he has been on alternatives.  He is satisfied with the current meds.  Discussed potential metabolic side effects associated with atypical antipsychotics, as well as potential risk for movement side effects. Advised pt to contact office if movement side effects occur.   No med changes today except for the addition of melatonin.  Ok melatonin 5 mg 30 min before sleep  FU 1 year bc stable for years  Lynder Parents, MD, DFAPA  Please see After Visit Summary for patient specific instructions.  Future Appointments  Date Time Provider [place]  10/25/2019 10:00 AM Tanda Rockers, MD [facility] None  01/23/2020 10:00 AM McGowen, Adrian Blackwater, MD [facility]    No orders of the defined types were placed in this encounter.   -------------------------------

## 2019-10-25 ENCOUNTER — Other Ambulatory Visit: Payer: Self-pay

## 2019-10-25 ENCOUNTER — Encounter: Payer: Self-pay | Admitting: Internal Medicine

## 2019-10-25 ENCOUNTER — Ambulatory Visit: Payer: Medicare HMO | Admitting: Internal Medicine

## 2019-10-25 DIAGNOSIS — R0600 Dyspnea, unspecified: Secondary | ICD-10-CM | POA: Diagnosis not present

## 2019-10-25 DIAGNOSIS — R0609 Other forms of dyspnea: Secondary | ICD-10-CM

## 2019-10-25 NOTE — Assessment & Plan Note (Addendum)
Onset  Not clear ? 2013 more tired than sob - 08/29/2019   Walked RA  2 laps @  approx 248ft each @ fast pace  stopped due to  Breathing felt a little heavy but sats 99% at end   - CXR 08/29/2019 c/w ? ILD > HRCT 09/26/2019  1. Very subtle areas of mild ground-glass attenuation in the extreme lung bases. This is nonspecific. The possibility of very early or mild interstitial lung disease is not excluded, but not strongly Favored    I had an extended final summary discussion with the patient reviewing all relevant studies completed to date and  lasting 15 to 20 minutes of a 25 minute visit on the following issues:   No evidence clinically of any significant ILD and making progress with progressive ambulation which is a very good prognostic sign  Labs reviewed and look ok x minimal elevation of BNP with last Echo c/w diastolic dysfunction so should continue coreg and keep an eye as planned on volume/renal function.   rec  Make sure you check your oxygen saturations at highest level of activity and f/u here if any trend downward in ex tol or 02 sat   >>> pulmonary f/u is prn

## 2019-10-25 NOTE — Patient Instructions (Signed)
Keep up the walking and check your saturations by pulse oximeter at peaks of exercise to see if there is any downward pattern  Pulmonary follow up is as needed

## 2019-10-25 NOTE — Progress Notes (Signed)
Curtis Jimenez, male    DOB: Mar 09, 1936      MRN: [id number]   Brief patient profile:  50 yowm from [place] [employer]/ quit smoking 04/1974 s/p renal transplant x 2013 with new sense of tired legs since then but unalble to pinpoint when, affects both legs and has not progressed and concerned with low sats at the doctor's office but denies doe so referred to pulmonary clinic 08/29/2019 by Dr Rolan Lipa.     History of Present Illness  08/29/2019  Pulmonary/ 1st office eval/Rosanne Wohlfarth  Chief Complaint  Patient presents with  . Pulmonary Consult    Referred by Curtis Jimenez. Pt states he can only walk 25-50 yards before his legs get tired. He denies any respiratory co's.   Dyspnea:  Shopped at [place] the cart until March 2020 s symptoms sometimes but not always used [facility] parking / walks at [employer] once a week x 5 min stops for 10 sec then keeps going total of up to 30 min but "if hyperventilate can go up to 100 yards s stopping"  Cough: no Sleep: able to lie flat /one pillow doe not feel rested but not related to any obvious  resp symptoms  SABA use: none rec To get the most out of exercise, you need to be continuously aware that you are short of breath, but never out of breath, for 30 minutes daily. As you improve, it will actually be easier for you to do the same amount of exercise  in  30 minutes so always push to the level where you are short of breath.   If not improving after about a month to your satisfaction, call for additional testing which might include either a cardiac stress test or a CPST  Please remember to go to the lab and x-ray department   for your tests - we will call you with the results when they are available.    10/25/2019  f/u ov/Neylan Koroma re: doe ? ild  Chief Complaint  Patient presents with  . Follow-up    Breathing has improved slightly. No new co's.   Dyspnea:  Walking at [employer] college x 20 min qod s stopping some hills slt less than avg pace  but making progress in terms of extending time he can walk (note for [place] he walked much faster than a "nl" pace)  Cough: on pills sometimes pills get stuck  Sleeping: flat/ one pillow no resp symptoms SABA use: none 02: none    No obvious day to day or daytime variability or assoc excess/ purulent sputum or mucus plugs or hemoptysis or cp or chest tightness, subjective wheeze or overt sinus or hb symptoms.   Sleeping  without nocturnal  or early am exacerbation  of respiratory  c/o's or need for noct saba. Also denies any obvious fluctuation of symptoms with weather or environmental changes or other aggravating or alleviating factors except as outlined above   No unusual exposure hx or h/o childhood pna/ asthma or knowledge of premature birth.  Current Allergies, Complete Past Medical History, Past Surgical History, Family History, and Social History were reviewed in [employer] record.  ROS  The following are not active complaints unless bolded Hoarseness, sore throat, dysphagia, dental problems, itching, sneezing,  nasal congestion or discharge of excess mucus or purulent secretions, ear ache,   fever, chills, sweats, unintended wt loss or wt gain, classically pleuritic or exertional cp,  orthopnea pnd or arm/hand swelling on side  with fistula  or leg swelling, presyncope, palpitations, abdominal pain, anorexia, nausea, vomiting, diarrhea  or change in bowel habits or change in bladder habits, change in stools or change in urine, dysuria, hematuria,  rash, arthralgias, visual complaints, headache, numbness, weakness or ataxia or problems with walking or coordination,  change in mood or  memory.        Current Meds  Medication Sig  . acetaminophen (TYLENOL) 325 MG tablet Take 650 mg by mouth every 6 (six) hours as needed for mild pain.  Marland Kitchen allopurinol (ZYLOPRIM) 300 MG tablet TAKE 1 TABLET BY MOUTH ONCE DAILY  . carvedilol (COREG) 12.5 MG tablet 1/2 tab po qAM and 1 tab  po qPM  . Cholecalciferol (VITAMIN D) 2000 UNITS CAPS Take 1 capsule by mouth daily.  . clopidogrel (PLAVIX) 75 MG tablet Take 1 tablet by mouth once daily  . colchicine 0.6 MG tablet 2 tabs at onset of gout flare, then 1 tab one hour later, then starting the next day take 1 tab bid until gout flare is resolved  . EUTHYROX 25 MCG tablet Take 1 tablet by mouth once daily  . hydrOXYzine (ATARAX/VISTARIL) 10 MG tablet Take 10 mg by mouth 2 (two) times daily as needed.   . mycophenolate (MYFORTIC) 180 MG EC tablet Take 360 mg by mouth 2 (two) times daily.   . Omega-3 1000 MG CAPS Take 2,000 mg by mouth 2 (two) times daily.   Marland Kitchen omeprazole (PRILOSEC) 20 MG capsule Take 1 capsule by mouth once daily  . risperiDONE (RISPERDAL) 2 MG tablet Take 2 mg by mouth at bedtime. 1/2 tab at bedtime  . simvastatin (ZOCOR) 20 MG tablet Take 1 tablet by mouth once daily  . sulfamethoxazole-trimethoprim (BACTRIM,SEPTRA) 400-80 MG tablet Take 1 tablet by mouth every Monday, Wednesday, and Friday.  . tacrolimus (PROGRAF) 0.5 MG capsule 2 in the am and 1 at night  . vitamin B-12 (CYANOCOBALAMIN) 100 MCG tablet Take 100 mcg by mouth daily.             Past Medical History:  Diagnosis Date  . Anemia   . Arthritis    Lumbar multilevel degenerative dz  . Basal cell carcinoma    neck (skin MD 2X per year)  . Bipolar disorder ([place])   . BPH (benign prostatic hyperplasia)    with elevated PSA; followed by Dr. Rosana Hoes at [facility]  . Cataract   . Chronic renal insufficiency, stage III (moderate) ([facility])    in pt w/hx of renal transplant.  Post-transplant sCr 1.4-1.6.  Last renal f/u 05/01/19->Cr 1.63, GFR 39 ml/min  . CVA (cerebral vascular accident) ([place]) 11/2016   Pontine (vertebrobasilar--imaging neg), TPA given.  Pt discharged on plavix.  Carotid dopplers ok, echocardiogram ok.  Residual deficit: vertigo but this improved greatly with therapy.  . Gallbladder polyp 2015   Asymptomatic  . GERD (gastroesophageal  reflux disease)   . Gout   . History of adenomatous polyp of colon 2018   Recall 5 yrs  . Hyperlipidemia   . Hypertension   . Hypothyroidism   . Ptosis due to aging    Right  . Rectal bleeding 09/2017   Admitted for obs due to Hb drop.  GI consulted---obs recommended.  No transfusion required.  Plavix was d/c'd, ferrous sulfate recommended.  Outpt GI f/u--plavix restarted and upper endoscopy was normal and colonoscopy showed 2 polyps and severe diverticular dz. Iron d/c'd 04/24/19.  Marland Kitchen Restless legs syndrome   . S/p cadaver renal  transplant 2013   Secondary to HTN +lithium toxicity over 30 yrs caused kidney destruction [facility] transplant MDs)  . Seborrheic dermatitis    eyebrows worst: Hytone rx'd by Dr. Denna Haggard.       Objective:    Stoic amb wm nad   Wt Readings from Last 3 Encounters:  10/25/19 188 lb 12.8 oz (85.6 kg)  08/29/19 187 lb (84.8 kg)  07/27/19 189 lb 4 oz (85.8 kg)     Vital signs reviewed - Note on arrival 02 sats  95% on RA       HEENT : pt wearing mask not removed for exam due to covid -19 concerns.    NECK :  without JVD/Nodes/TM/ nl carotid upstrokes bilaterally   LUNGS: no acc muscle use,  Nl contour chest with very minimal inspiratory crackles  bilaterally without cough on insp or exp maneuvers   CV:  RRR  no s3 or murmur or increase in P2, and no edema   ABD:  soft and nontender with nl inspiratory excursion in the supine position. No bruits or organomegaly appreciated, bowel sounds nl  MS:  Nl gait/ ext warm without deformities, calf tenderness, cyanosis or clubbing No obvious joint restrictions   SKIN: warm and dry without lesions    NEURO:  alert, approp, nl sensorium with  no motor or cerebellar deficits apparent.          I personally reviewed images and agree with radiology impression as follows:  HRCT 09/26/19  1. Very subtle areas of mild ground-glass attenuation in the extreme lung bases. This is nonspecific. The possibility of very  early or mild interstitial lung disease is not excluded, but not strongly favored.            Assessment   DOE (dyspnea on exertion) Onset  Not clear ? 2013 more tired than sob - 08/29/2019   Walked RA  2 laps @  approx 284ft each @ fast pace  stopped due to  Breathing felt a little heavy but sats 99% at end   - CXR 08/29/2019 c/w ? ILD > HRCT ordered.

## 2019-10-29 ENCOUNTER — Encounter: Payer: Self-pay | Admitting: Family Medicine

## 2019-11-06 DIAGNOSIS — Z940 Kidney transplant status: Secondary | ICD-10-CM | POA: Diagnosis not present

## 2019-11-06 DIAGNOSIS — I129 Hypertensive chronic kidney disease with stage 1 through stage 4 chronic kidney disease, or unspecified chronic kidney disease: Secondary | ICD-10-CM | POA: Diagnosis not present

## 2019-11-08 ENCOUNTER — Other Ambulatory Visit: Payer: Self-pay | Admitting: Family Medicine

## 2019-11-10 DIAGNOSIS — I129 Hypertensive chronic kidney disease with stage 1 through stage 4 chronic kidney disease, or unspecified chronic kidney disease: Secondary | ICD-10-CM | POA: Diagnosis not present

## 2019-11-10 DIAGNOSIS — M109 Gout, unspecified: Secondary | ICD-10-CM | POA: Diagnosis not present

## 2019-11-10 DIAGNOSIS — E669 Obesity, unspecified: Secondary | ICD-10-CM | POA: Diagnosis not present

## 2019-11-10 DIAGNOSIS — M7989 Other specified soft tissue disorders: Secondary | ICD-10-CM | POA: Diagnosis not present

## 2019-11-10 DIAGNOSIS — K922 Gastrointestinal hemorrhage, unspecified: Secondary | ICD-10-CM | POA: Diagnosis not present

## 2019-11-10 DIAGNOSIS — R0902 Hypoxemia: Secondary | ICD-10-CM | POA: Diagnosis not present

## 2019-11-10 DIAGNOSIS — Z940 Kidney transplant status: Secondary | ICD-10-CM | POA: Diagnosis not present

## 2019-11-10 DIAGNOSIS — R739 Hyperglycemia, unspecified: Secondary | ICD-10-CM | POA: Diagnosis not present

## 2019-11-28 ENCOUNTER — Telehealth: Payer: Self-pay | Admitting: Pulmonary Disease

## 2019-11-28 NOTE — Telephone Encounter (Signed)
I called and spoke with the patient and made him aware that I did not see where we had called him recently. Nothing further is needed.

## 2019-12-26 LAB — BASIC METABOLIC PANEL: Creatinine: 1.6 — AB (ref <=1.3)

## 2019-12-29 DIAGNOSIS — I452 Bifascicular block: Secondary | ICD-10-CM

## 2019-12-29 HISTORY — DX: Bifascicular block: I45.2

## 2020-01-09 ENCOUNTER — Encounter: Payer: Self-pay | Admitting: Family Medicine

## 2020-01-20 ENCOUNTER — Other Ambulatory Visit: Payer: Self-pay | Admitting: Family Medicine

## 2020-01-22 NOTE — Telephone Encounter (Signed)
Pt has appt tomorrow. LM to call back if RF needed before then.

## 2020-01-23 ENCOUNTER — Encounter: Payer: Self-pay | Admitting: Family Medicine

## 2020-01-23 ENCOUNTER — Other Ambulatory Visit: Payer: Self-pay

## 2020-01-23 ENCOUNTER — Ambulatory Visit (INDEPENDENT_AMBULATORY_CARE_PROVIDER_SITE_OTHER): Payer: Medicare HMO | Admitting: Family Medicine

## 2020-01-23 VITALS — BP 133/72 | HR 71 | Temp 98.2°F | Resp 16 | Ht 69.0 in | Wt 191.2 lb

## 2020-01-23 DIAGNOSIS — R011 Cardiac murmur, unspecified: Secondary | ICD-10-CM | POA: Diagnosis not present

## 2020-01-23 DIAGNOSIS — I499 Cardiac arrhythmia, unspecified: Secondary | ICD-10-CM | POA: Diagnosis not present

## 2020-01-23 DIAGNOSIS — R0609 Other forms of dyspnea: Secondary | ICD-10-CM

## 2020-01-23 DIAGNOSIS — I10 Essential (primary) hypertension: Secondary | ICD-10-CM | POA: Diagnosis not present

## 2020-01-23 DIAGNOSIS — Z940 Kidney transplant status: Secondary | ICD-10-CM | POA: Diagnosis not present

## 2020-01-23 DIAGNOSIS — R7301 Impaired fasting glucose: Secondary | ICD-10-CM | POA: Diagnosis not present

## 2020-01-23 DIAGNOSIS — E039 Hypothyroidism, unspecified: Secondary | ICD-10-CM | POA: Diagnosis not present

## 2020-01-23 DIAGNOSIS — Z8673 Personal history of transient ischemic attack (TIA), and cerebral infarction without residual deficits: Secondary | ICD-10-CM | POA: Diagnosis not present

## 2020-01-23 DIAGNOSIS — R0600 Dyspnea, unspecified: Secondary | ICD-10-CM | POA: Diagnosis not present

## 2020-01-23 DIAGNOSIS — R9431 Abnormal electrocardiogram [ECG] [EKG]: Secondary | ICD-10-CM

## 2020-01-23 DIAGNOSIS — N183 Chronic kidney disease, stage 3 unspecified: Secondary | ICD-10-CM

## 2020-01-23 NOTE — Progress Notes (Signed)
OFFICE VISIT  01/23/2020   CC:  Chief Complaint  Patient presents with  . Follow-up    RCI, pt is fasting   HPI:    Patient is a 84 y.o. Caucasian male who presents for 6 mo f/u HTN, CRI 3, hx of CVA, and hypothyroidism. Most recent nephrology f/u note 11/10/19->reviewed.  All labs stable.  He was referred to pulm b/c of his concerns that his oxygen checks were lower than he expected. Dr. Melvyn Novas saw him and PMH updated-->see PMH section below.  Feeling fine. Occ orthostatic dizziness, mild. Only occ bp check at home-->normal.  Takes his T4 on empty stomach w/out any other meds.  No probs with constipation or bleeding.  ROS: no CP, he has DOE with walking about 25 yards and this resolves with resting 5 seconds or so, no wheezing, no cough,no HAs, no rashes, no melena/hematochezia.  No polyuria or polydipsia.  No myalgias or arthralgias.  No focal weakness, swallowing probs, dysarthria, or sensory complaints. He can occ detect an irregular heartbeat but no tachycardia or sense of palpitations.    Past Medical History:  Diagnosis Date  . Anemia   . Basal cell carcinoma    neck (skin MD 2X per year)  . Bipolar disorder ([place])   . BPH (benign prostatic hyperplasia)    with elevated PSA; followed by Dr. Rosana Hoes at [facility]  . Chronic renal insufficiency, stage III (moderate)    in pt w/hx of renal transplant.  Post-transplant sCr 1.4-1.6.  Last renal f/u 05/01/19->Cr 1.63, GFR 39 ml/min  . Coronary atherosclerosis    LM and 3 V dz noted on CT 08/2019 performed for eval of interstitial lung dz in the setting of mild DOE and mild hypoxia.  Marland Kitchen CVA (cerebral vascular accident) ([place]) 11/2016   Pontine (vertebrobasilar--imaging neg), TPA given.  Pt discharged on plavix.  Carotid dopplers ok, echocardiogram ok.  Residual deficit: vertigo but this improved greatly with therapy.  . DOE (dyspnea on exertion) 2020   DOE and ? hypoxia->CXR 08/29/19--->diffuse interstitial opacities-? acute  inflammatory process->Dr. Melvyn Novas eval'd him and was underwhelmed by the CXR and exam->CT chest 09/26/19 "Very subtle areas of mild ground-glass attenuatio-nonspecific", mild air trapping. Per Dr. Melvyn Novas, no signif ILD, improving with inc ambulation (post-inflamm pulm fibrosis?)  . Gallbladder polyp 2015   Asymptomatic  . GERD (gastroesophageal reflux disease)   . Gout   . History of adenomatous polyp of colon 2018   Recall 5 yrs  . Hyperlipidemia   . Hypertension   . Hypothyroidism   . Lumbar spondylosis   . Ptosis due to aging    Right  . Rectal bleeding 09/2017   Admitted for obs due to Hb drop.  GI consulted---obs recommended.  No transfusion required.  Plavix was d/c'd, ferrous sulfate recommended.  Outpt GI f/u--plavix restarted and upper endoscopy was normal and colonoscopy showed 2 polyps and severe diverticular dz. Iron d/c'd 04/24/19.  Marland Kitchen Restless legs syndrome   . S/p cadaver renal transplant 2013   Secondary to HTN +lithium toxicity over 30 yrs caused kidney destruction [facility] transplant MDs)  . Seborrheic dermatitis    eyebrows worst: Hytone rx'd by Dr. Denna Haggard.  . 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 AV FISTULA PLACEMENT  04/08/2012  Procedure: ARTERIOVENOUS (AV) FISTULA CREATION;  Surgeon: Angelia Mould, MD;  Location: [facility] OR;  Service: Vascular;  Laterality: Left;  Creation of left brachial cephalic arteriovenous fistula  . CATARACT EXTRACTION, BILATERAL  05/2018  . COLONOSCOPY  2013; 11/24/17   2013; Normal except diverticulosis (recall 10 yrs).  2018 (for GI bleeding)--severe diverticular dz + 2 adenomatous polyps.  Recall 5 yrs.  . dilation for GERD    . ESOPHAGOGASTRODUODENOSCOPY  11/24/2017   gastric polyps x 2, otherwise normal.  .  KIDNEY TRANSPLANT  11/06/12   [place] (cadaveric)  . PROSTATE BIOPSY  2011   no malignancy  . TRANSTHORACIC ECHOCARDIOGRAM  11/2016   Normal LV systolic fxn, EF 50-93%.  Grade I DD.  Mild aortic root dilatation, mild MV regurg.    Outpatient Medications Prior to Visit  Medication Sig Dispense Refill  . acetaminophen (TYLENOL) 325 MG tablet Take 650 mg by mouth every 6 (six) hours as needed for mild pain.    Marland Kitchen allopurinol (ZYLOPRIM) 300 MG tablet Take 1 tablet by mouth once daily 90 tablet 1  . carvedilol (COREG) 12.5 MG tablet 1/2 tab po qAM and 1 tab po qPM 135 tablet 3  . Cholecalciferol (VITAMIN D) 2000 UNITS CAPS Take 1 capsule by mouth daily.    . clopidogrel (PLAVIX) 75 MG tablet Take 1 tablet by mouth once daily 90 tablet 1  . colchicine 0.6 MG tablet 2 tabs at onset of gout flare, then 1 tab one hour later, then starting the next day take 1 tab bid until gout flare is resolved 20 tablet 1  . EUTHYROX 25 MCG tablet Take 1 tablet by mouth once daily 90 tablet 3  . hydrOXYzine (ATARAX/VISTARIL) 10 MG tablet Take 10 mg by mouth 2 (two) times daily as needed.     . mycophenolate (MYFORTIC) 180 MG EC tablet Take 360 mg by mouth 2 (two) times daily.     . Omega-3 1000 MG CAPS Take 2,000 mg by mouth 2 (two) times daily.     Marland Kitchen omeprazole (PRILOSEC) 20 MG capsule Take 1 capsule by mouth once daily 90 capsule 1  . simvastatin (ZOCOR) 20 MG tablet Take 1 tablet by mouth once daily 90 tablet 0  . sulfamethoxazole-trimethoprim (BACTRIM,SEPTRA) 400-80 MG tablet Take 1 tablet by mouth every Monday, Wednesday, and Friday.    . tacrolimus (PROGRAF) 0.5 MG capsule 2 in the am and 1 at night    . vitamin B-12 (CYANOCOBALAMIN) 100 MCG tablet Take 100 mcg by mouth daily.    . risperiDONE (RISPERDAL) 2 MG tablet Take 1 mg by mouth at bedtime. 1mg  at bedtime     No facility-administered medications prior to visit.    Allergies  Allergen Reactions  . Amantadine Hcl Itching    REACTION: itching  .  Chlorpromazine Hcl     REACTION: "fell flat on face"    ROS As per HPI  PE: Blood pressure 133/72, pulse 71, temperature 98.2 F (36.8 C), temperature source Temporal, resp. rate 16, height 5\' 9"  (1.753 m), weight 191 lb 3.2 oz (86.7 kg), SpO2 95 %. Body mass index is 28.24 kg/m.  Gen: Alert, well appearing.  Patient is oriented to person, place, time, and situation. AFFECT: pleasant, lucid thought and speech. CV: Irregular, rate 26Z, soft systolic and diastolic murmurs.  Syst murmur rad extensively to area of markedly dilated/palpable AV fistula in L arm.  No rub. LUNGS: CTA bilat, nonlabored resps.  Mild decrease in BS diffusely. ABD: soft,  NT/ND EXT: no clubbing or cyanosis.  no edema.    LABS:  Lab Results  Component Value Date   TSH 1.31 05/09/2019   Lab Results  Component Value Date   WBC 4.9 08/16/2019   HGB 13.8 08/16/2019   HCT 41 08/16/2019   MCV 87.3 04/21/2019   PLT 180 08/16/2019   Lab Results  Component Value Date   IRON 78 04/21/2019   TIBC 286 04/21/2019   FERRITIN 79 04/21/2019    Lab Results  Component Value Date   CREATININE 1.6 (A) 12/26/2019   BUN 28 (H) 08/29/2019   NA 142 08/29/2019   K 4.3 08/29/2019   CL 108 08/29/2019   CO2 26 08/29/2019   Lab Results  Component Value Date   ALT 8 (A) 08/16/2019   AST 11 (A) 08/16/2019   ALKPHOS 54 08/16/2019   BILITOT 0.8 09/13/2018   Lab Results  Component Value Date   CHOL 109 07/27/2019   Lab Results  Component Value Date   HDL 39 (L) 07/27/2019   Lab Results  Component Value Date   LDLCALC 56 07/27/2019   Lab Results  Component Value Date   TRIG 68 07/27/2019   Lab Results  Component Value Date   CHOLHDL 2.8 07/27/2019   Lab Results  Component Value Date   PSA 4.2 (H) 11/05/2017   PSA 3.51 03/28/2008   PSA 4.83 (H) 12/02/2007   Lab Results  Component Value Date   HGBA1C 5.7 (H) 07/27/2019   12 lead EKG today: sinus rhythm, rate 75, with some ectopic ventricular beats,  RBBB, left axis-bifascicular block.  No ST segment abnormalities or T wave abnormalities or Q waves.  QRS duration is 140 msec. PR interval and QT intervals normal. Compared to 10/08/17 EKG, his bifascicular block is a bit more prominent and QRS duration is a bit longer, and his ectopic ventricular beats are new.  IMPRESSION AND PLAN:  1) Irregular cardiac rhythm: abnormal EKG, some conduction abnormalities a bit more complicated than prior EKGs.   Lytes/cr, TSH, mag level today. I will refer to cardiology to see if anything further needs to be done.  2) HTN: The current medical regimen is effective;  continue present plan and medications. Lytes/cr today.  3) CRI III, pt s/p transplant: has been stable, pt getting appropriate nephrology f/u. Lytes/cr today.  4) IFG: check A1c today.  5) Hypothyroidism: this has been stable over time, pt taking med correctly. TSH monitoring today.  6) Hx of CVA: no residual deficits. Carotids were clear at the time of CVA. No hx of a-fib, but see #1 above. Continue plavix.  No sign of bleeding.  7) DOE: stable. Recent pulm eval reassuring.  8) Heart murmur: I believe these abnormal heart sounds are largely transmitted turbulence from his L upper arm AV fistula with dilatation.   Cardiology referral as per #1 above. ? Repeat echocardiogram.  An After Visit Summary was printed and given to the patient.  FOLLOW UP: Return in about 6 months (around 07/22/2020) for annual CPE (fasting).  Signed:  Crissie Sickles, MD           01/23/2020

## 2020-01-24 LAB — COMPREHENSIVE METABOLIC PANEL
AG Ratio: 1.8 (calc) (ref 1.0–2.5)
ALT: 9 U/L (ref 9–46)
AST: 12 U/L (ref 10–35)
Albumin: 3.8 g/dL (ref 3.6–5.1)
Alkaline phosphatase (APISO): 52 U/L (ref 35–144)
BUN/Creatinine Ratio: 16 (calc) (ref 6–22)
BUN: 25 mg/dL (ref 7–25)
CO2: 24 mmol/L (ref 20–32)
Calcium: 9.1 mg/dL (ref 8.6–10.3)
Chloride: 109 mmol/L (ref 98–110)
Creat: 1.54 mg/dL — ABNORMAL HIGH (ref 0.70–1.11)
Globulin: 2.1 g/dL (calc) (ref 1.9–3.7)
Glucose, Bld: 108 mg/dL — ABNORMAL HIGH (ref 65–99)
Potassium: 4.4 mmol/L (ref 3.5–5.3)
Sodium: 142 mmol/L (ref 135–146)
Total Bilirubin: 0.7 mg/dL (ref 0.2–1.2)
Total Protein: 5.9 g/dL — ABNORMAL LOW (ref 6.1–8.1)

## 2020-01-24 LAB — HEMOGLOBIN A1C
Hgb A1c MFr Bld: 5.9 % of total Hgb — ABNORMAL HIGH (ref <5.7)
Mean Plasma Glucose: 123 (calc)
eAG (mmol/L): 6.8 (calc)

## 2020-01-24 LAB — MAGNESIUM: Magnesium: 1.7 mg/dL (ref 1.5–2.5)

## 2020-01-24 LAB — TSH: TSH: 0.98 mIU/L (ref 0.40–4.50)

## 2020-01-25 NOTE — Progress Notes (Signed)
Cardiology Office Note   Date:  01/26/2020   ID:  Curtis Jimenez, DOB 1936/09/05, MRN [id number]  PCP:  Tammi Sou, MD  Cardiologist:   No primary care provider on file. Referring:  Tammi Sou, MD  Chief Complaint  Patient presents with  . PVCs  . Elevated Coronary Calcium      History of Present Illness: Curtis Jimenez is a 84 y.o. male who is referred by Tammi Sou, MD for evalution of PVCs and coronary calcium .  He has been noted to have three vessel coronary calcification on CT. the patient has had evaluation in the past.  He had a renal transplant in 2013 and I was able to go back and reviewed this.  He had a negative stress echocardiogram.  Echo in 2017 demonstrated a normal EF.  He was not aware that he had coronary calcium.  He has been told about PVCs and he was told again about this.  He is currently having some problems with his back and so is not been quite as active.  However, he does not really feel his palpitations.  He has no presyncope or syncope.  Prior to having to walk with a cane he was not having any chest pressure, neck or arm discomfort.  He has had no new shortness of breath, PND or orthopnea.  His legs get tired when he walks because of his back discomfort.  He does have a large left upper arm AV fistula but it has not gotten larger in the last year or 2.   Past Medical History:  Diagnosis Date  . Anemia   . Basal cell carcinoma    neck (skin MD 2X per year)  . Bipolar disorder ([place])   . BPH (benign prostatic hyperplasia)    with elevated PSA; followed by Dr. Rosana Hoes at [facility]  . Chronic renal insufficiency, stage III (moderate)    in pt w/hx of renal transplant.  Post-transplant sCr 1.4-1.6.  Last renal f/u 05/01/19->Cr 1.63, GFR 39 ml/min  . Coronary atherosclerosis    LM and 3 V dz noted on CT 08/2019 performed for eval of interstitial lung dz in the setting of mild DOE and mild hypoxia.  Marland Kitchen CVA (cerebral vascular  accident) ([place]) 11/2016   Pontine (vertebrobasilar--imaging neg), TPA given.  Pt discharged on plavix.  Carotid dopplers ok, echocardiogram ok.  Residual deficit: vertigo but this improved greatly with therapy.  . DOE (dyspnea on exertion) 2020   DOE and ? hypoxia->CXR 08/29/19--->diffuse interstitial opacities-? acute inflammatory process->Dr. Melvyn Novas eval'd him and was underwhelmed by the CXR and exam->CT chest 09/26/19 "Very subtle areas of mild ground-glass attenuatio-nonspecific", mild air trapping. Per Dr. Melvyn Novas, no signif ILD, improving with inc ambulation (post-inflamm pulm fibrosis?)  . Gallbladder polyp 2015   Asymptomatic  . GERD (gastroesophageal reflux disease)   . Gout   . History of adenomatous polyp of colon 2018   Recall 5 yrs  . Hyperlipidemia   . Hypertension   . Hypothyroidism   . Lumbar spondylosis   . Ptosis due to aging    Right  . Rectal bleeding 09/2017   Admitted for obs due to Hb drop.  GI consulted---obs recommended.  No transfusion required.  Plavix was d/c'd, ferrous sulfate recommended.  Outpt GI f/u--plavix restarted and upper endoscopy was normal and colonoscopy showed 2 polyps and severe diverticular dz. Iron d/c'd 04/24/19.  Marland Kitchen Restless legs syndrome   . S/p cadaver renal transplant  2013   Secondary to HTN +lithium toxicity over 30 yrs caused kidney destruction [facility] transplant MDs)  . Seborrheic dermatitis    eyebrows worst: Hytone rx'd by Dr. Denna Haggard.  . 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 AV FISTULA PLACEMENT  04/08/2012   Procedure: ARTERIOVENOUS (AV) FISTULA CREATION;  Surgeon: Angelia Mould, MD;  Location: [facility] OR;  Service: Vascular;  Laterality: Left;  Creation of left brachial cephalic arteriovenous fistula  .  CATARACT EXTRACTION, BILATERAL  05/2018  . COLONOSCOPY  2013; 11/24/17   2013; Normal except diverticulosis (recall 10 yrs).  2018 (for GI bleeding)--severe diverticular dz + 2 adenomatous polyps.  Recall 5 yrs.  . dilation for GERD    . ESOPHAGOGASTRODUODENOSCOPY  11/24/2017   gastric polyps x 2, otherwise normal.  . KIDNEY TRANSPLANT  11/06/12   [place] (cadaveric)  . PROSTATE BIOPSY  2011   no malignancy  . TRANSTHORACIC ECHOCARDIOGRAM  11/2016   Normal LV systolic fxn, EF 93-79%.  Grade I DD.  Mild aortic root dilatation, mild MV regurg.     Current Outpatient Medications  Medication Sig Dispense Refill  . acetaminophen (TYLENOL) 325 MG tablet Take 650 mg by mouth every 6 (six) hours as needed for mild pain.    Marland Kitchen allopurinol (ZYLOPRIM) 300 MG tablet Take 1 tablet by mouth once daily 90 tablet 1  . carvedilol (COREG) 12.5 MG tablet 1/2 tab po qAM and 1 tab po qPM 135 tablet 3  . Cholecalciferol (VITAMIN D) 2000 UNITS CAPS Take 1 capsule by mouth daily.    . clopidogrel (PLAVIX) 75 MG tablet Take 1 tablet by mouth once daily 90 tablet 1  . colchicine 0.6 MG tablet 2 tabs at onset of gout flare, then 1 tab one hour later, then starting the next day take 1 tab bid until gout flare is resolved 20 tablet 1  . EUTHYROX 25 MCG tablet Take 1 tablet by mouth once daily 90 tablet 3  . hydrOXYzine (ATARAX/VISTARIL) 10 MG tablet Take 10 mg by mouth 2 (two) times daily as needed.     . mycophenolate (MYFORTIC) 180 MG EC tablet Take 360 mg by mouth 2 (two) times daily.     . Omega-3 1000 MG CAPS Take 2,000 mg by mouth 2 (two) times daily.     Marland Kitchen omeprazole (PRILOSEC) 20 MG capsule Take 1 capsule by mouth once daily 90 capsule 1  . risperiDONE (RISPERDAL) 2 MG tablet Take 1 mg by mouth at bedtime. 1mg  at bedtime    . simvastatin (ZOCOR) 20 MG tablet Take 1 tablet by mouth once daily 90 tablet 1  . sulfamethoxazole-trimethoprim (BACTRIM,SEPTRA) 400-80 MG tablet Take 1 tablet by mouth every Monday,  Wednesday, and Friday.    . tacrolimus (PROGRAF) 0.5 MG capsule 2 in the am and 1 at night    . vitamin B-12 (CYANOCOBALAMIN) 100 MCG tablet Take 100 mcg by mouth daily.     No current facility-administered medications for this visit.    Allergies:   Amantadine hcl and Chlorpromazine hcl    Social History:  The patient  reports that he quit smoking about 45 years ago. His smoking use included pipe and cigars. He quit after 16.00 years of use. He has never used smokeless tobacco. He reports  that he does not drink alcohol or use drugs.   Family History:  [name] family history includes Bladder Cancer in his father; Heart disease in his father; Ulcers in his mother.    ROS:  Please see the history of present illness.   Otherwise, review of systems are positive for none.   All other systems are reviewed and negative.    PHYSICAL EXAM: VS:  BP 102/60   Pulse 84   Temp (!) 95.7 F (35.4 C)   Ht 5\' 9"  (1.753 m)   Wt 195 lb (88.5 kg)   BMI 28.80 kg/m  , BMI Body mass index is 28.8 kg/m. GENERAL:  Well appearing HEENT:  Pupils equal round and reactive, fundi not visualized, oral mucosa unremarkable NECK:  No jugular venous distention, waveform within normal limits, carotid upstroke brisk and symmetric, no bruits, no thyromegaly LYMPHATICS:  No cervical, inguinal adenopathy LUNGS:  Clear to auscultation bilaterally BACK:  No CVA tenderness CHEST:  Unremarkable HEART:  PMI not displaced or sustained,S1 and S2 within normal limits, no S3, no S4, no clicks, no rubs, to and fro murmur related to his fistula ABD:  Flat, positive bowel sounds normal in frequency in pitch, no bruits, no rebound, no guarding, no midline pulsatile mass, no hepatomegaly, no splenomegaly EXT:  2 plus pulses throughout, no edema, no cyanosis no clubbing, fistula large left upper arm with positive thrill and bruit SKIN:  No rashes no nodules NEURO:  Cranial nerves II through XII grossly intact, motor grossly  intact throughout PSYCH:  Cognitively intact, oriented to person place and time    EKG:  EKG is ordered today. The ekg ordered today demonstrates sinus rhythm, rate 84, right bundle branch block, left anterior fascicular block, premature ventricular contractions.   Recent Labs: 08/16/2019: Hemoglobin 13.8; Platelets 180 08/29/2019: Pro B Natriuretic peptide (BNP) 262.0 01/23/2020: ALT 9; BUN 25; Creat 1.54; Magnesium 1.7; Potassium 4.4; Sodium 142; TSH 0.98    Lipid Panel    Component Value Date/Time   CHOL 109 07/27/2019 1400   TRIG 68 07/27/2019 1400   TRIG 130 12/15/2006 1045   HDL 39 (L) 07/27/2019 1400   CHOLHDL 2.8 07/27/2019 1400   VLDL 13 12/01/2016 0246   LDLCALC 56 07/27/2019 1400      Wt Readings from Last 3 Encounters:  01/26/20 195 lb (88.5 kg)  01/23/20 191 lb 3.2 oz (86.7 kg)  10/25/19 188 lb 12.8 oz (85.6 kg)      Other studies Reviewed: Additional studies/ records that were reviewed today include: Care Everywhere, labs, echo, CT Review of the above records demonstrates:  Please see elsewhere in the note.     ASSESSMENT AND PLAN:  CORONARY CALCIFICATION:    The patient has three-vessel coronary disease.  He is not having any symptoms and has a reasonable functional level.  He did have stress echocardiography years ago.  He would be able to walk on a treadmill and I do not think that further nuclear or stress echo imaging is indicated.  Rather we should treat him with primary risk reduction.  He should be aware of symptoms that might indicate obstructive coronary disease and he is not having any of these currently.  Of note his lipids and his blood pressure are excellent.  ECTOPY: This was noted as far back as 2013.  He has no sustained tachypalpitations.  He has no presyncope or syncope.  We also discussed his bifascicular block.  He is to let me know if  he ever has any symptoms such as presyncope or syncope.  For now he can manage this medically.  COVID  EDUCATION: We talked about the vaccine.  Current medicines are reviewed at length with the patient today.  The patient does not have concerns regarding medicines.  The following changes have been made:  no change  Labs/ tests ordered today include:   Orders Placed This Encounter  Procedures  . EKG 12-Lead     Disposition:   FU with me as needed.      Signed, Minus Breeding, MD  01/26/2020 12:57 PM    [facility]

## 2020-01-26 ENCOUNTER — Encounter: Payer: Self-pay | Admitting: Cardiology

## 2020-01-26 ENCOUNTER — Ambulatory Visit: Payer: Medicare HMO | Admitting: Cardiology

## 2020-01-26 ENCOUNTER — Other Ambulatory Visit: Payer: Self-pay

## 2020-01-26 VITALS — BP 102/60 | HR 84 | Temp 95.7°F | Ht 69.0 in | Wt 195.0 lb

## 2020-01-26 DIAGNOSIS — R0609 Other forms of dyspnea: Secondary | ICD-10-CM

## 2020-01-26 DIAGNOSIS — R0600 Dyspnea, unspecified: Secondary | ICD-10-CM

## 2020-01-26 DIAGNOSIS — I10 Essential (primary) hypertension: Secondary | ICD-10-CM

## 2020-01-26 NOTE — Patient Instructions (Signed)
Medication Instructions:  No Changes *If you need a refill on your cardiac medications before your next appointment, please call your pharmacy*  Lab Work: None  Testing/Procedures: None  Follow-Up: At [facility], you and your health needs are our priority.  As part of our continuing mission to provide you with exceptional heart care, we have created designated Provider Care Teams.  These Care Teams include your primary Cardiologist (physician) and Advanced Practice Providers (APPs -  Physician Assistants and Nurse Practitioners) who all work together to provide you with the care you need, when you need it.  Other Instructions Follow up as needed

## 2020-01-31 ENCOUNTER — Encounter: Payer: Self-pay | Admitting: Family Medicine

## 2020-02-06 DIAGNOSIS — E785 Hyperlipidemia, unspecified: Secondary | ICD-10-CM | POA: Diagnosis not present

## 2020-02-06 DIAGNOSIS — Z792 Long term (current) use of antibiotics: Secondary | ICD-10-CM | POA: Diagnosis not present

## 2020-02-06 DIAGNOSIS — Z79899 Other long term (current) drug therapy: Secondary | ICD-10-CM | POA: Diagnosis not present

## 2020-02-06 DIAGNOSIS — Z85828 Personal history of other malignant neoplasm of skin: Secondary | ICD-10-CM | POA: Diagnosis not present

## 2020-02-06 DIAGNOSIS — R69 Illness, unspecified: Secondary | ICD-10-CM | POA: Diagnosis not present

## 2020-02-06 DIAGNOSIS — Z940 Kidney transplant status: Secondary | ICD-10-CM | POA: Diagnosis not present

## 2020-02-06 DIAGNOSIS — D8989 Other specified disorders involving the immune mechanism, not elsewhere classified: Secondary | ICD-10-CM | POA: Diagnosis not present

## 2020-02-06 DIAGNOSIS — E039 Hypothyroidism, unspecified: Secondary | ICD-10-CM | POA: Diagnosis not present

## 2020-02-06 DIAGNOSIS — Z7902 Long term (current) use of antithrombotics/antiplatelets: Secondary | ICD-10-CM | POA: Diagnosis not present

## 2020-02-06 DIAGNOSIS — I10 Essential (primary) hypertension: Secondary | ICD-10-CM | POA: Diagnosis not present

## 2020-02-06 DIAGNOSIS — I120 Hypertensive chronic kidney disease with stage 5 chronic kidney disease or end stage renal disease: Secondary | ICD-10-CM | POA: Diagnosis not present

## 2020-02-06 DIAGNOSIS — Z4822 Encounter for aftercare following kidney transplant: Secondary | ICD-10-CM | POA: Diagnosis not present

## 2020-02-06 DIAGNOSIS — Z8673 Personal history of transient ischemic attack (TIA), and cerebral infarction without residual deficits: Secondary | ICD-10-CM | POA: Diagnosis not present

## 2020-02-26 DIAGNOSIS — M7989 Other specified soft tissue disorders: Secondary | ICD-10-CM | POA: Diagnosis not present

## 2020-02-26 DIAGNOSIS — I129 Hypertensive chronic kidney disease with stage 1 through stage 4 chronic kidney disease, or unspecified chronic kidney disease: Secondary | ICD-10-CM | POA: Diagnosis not present

## 2020-02-26 DIAGNOSIS — R739 Hyperglycemia, unspecified: Secondary | ICD-10-CM | POA: Diagnosis not present

## 2020-02-26 DIAGNOSIS — Z940 Kidney transplant status: Secondary | ICD-10-CM | POA: Diagnosis not present

## 2020-02-26 DIAGNOSIS — K922 Gastrointestinal hemorrhage, unspecified: Secondary | ICD-10-CM | POA: Diagnosis not present

## 2020-02-26 DIAGNOSIS — R0902 Hypoxemia: Secondary | ICD-10-CM | POA: Diagnosis not present

## 2020-02-26 DIAGNOSIS — E669 Obesity, unspecified: Secondary | ICD-10-CM | POA: Diagnosis not present

## 2020-02-26 DIAGNOSIS — M109 Gout, unspecified: Secondary | ICD-10-CM | POA: Diagnosis not present

## 2020-03-04 DIAGNOSIS — R69 Illness, unspecified: Secondary | ICD-10-CM | POA: Diagnosis not present

## 2020-05-02 IMAGING — DX DG CHEST 2V
2 series · 2 of 2 positions shown · non-contrast
Comparison: 04/04/2012

CLINICAL DATA: Dyspnea on exertion

EXAM:
CHEST - 2 VIEW

[chest pa]
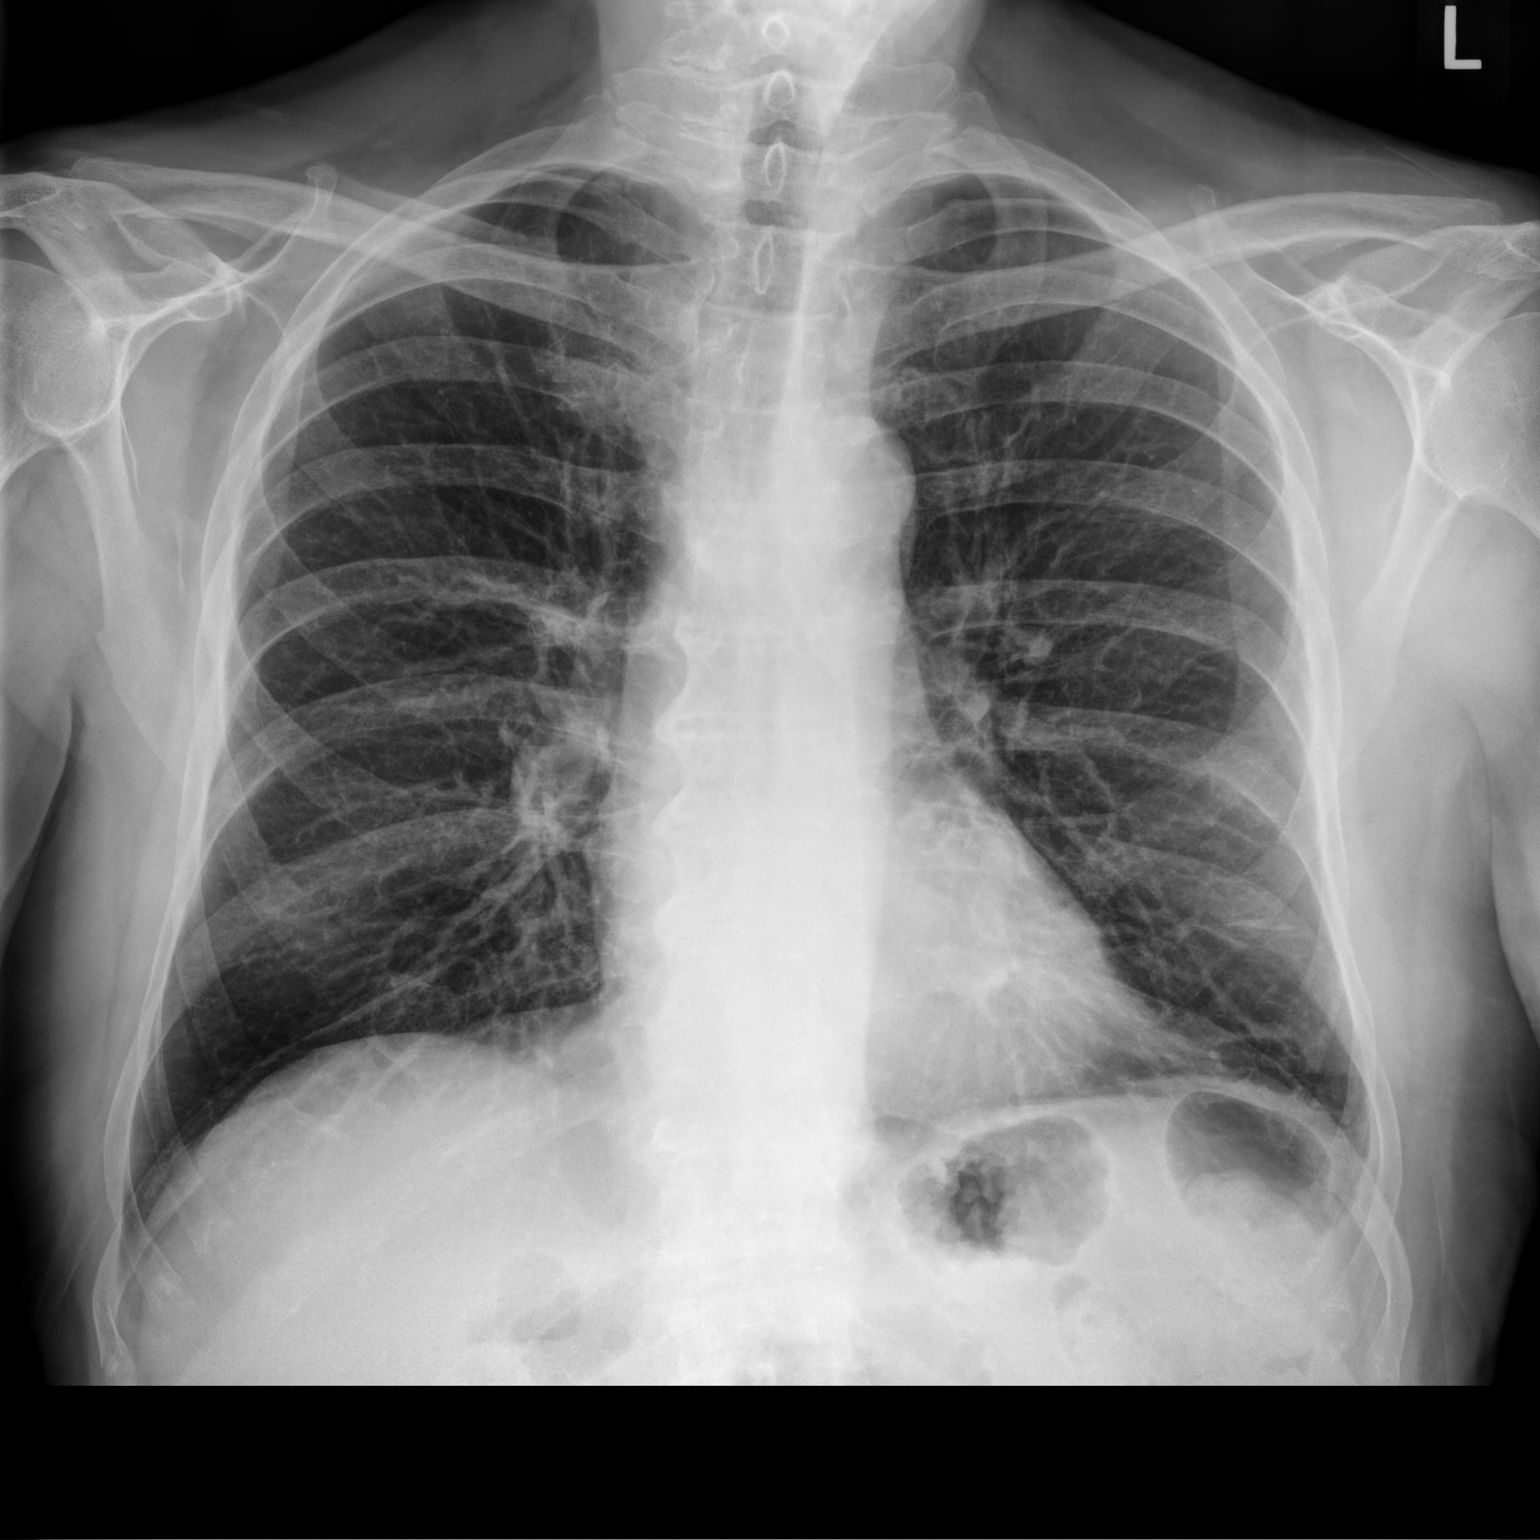

[chest lat]
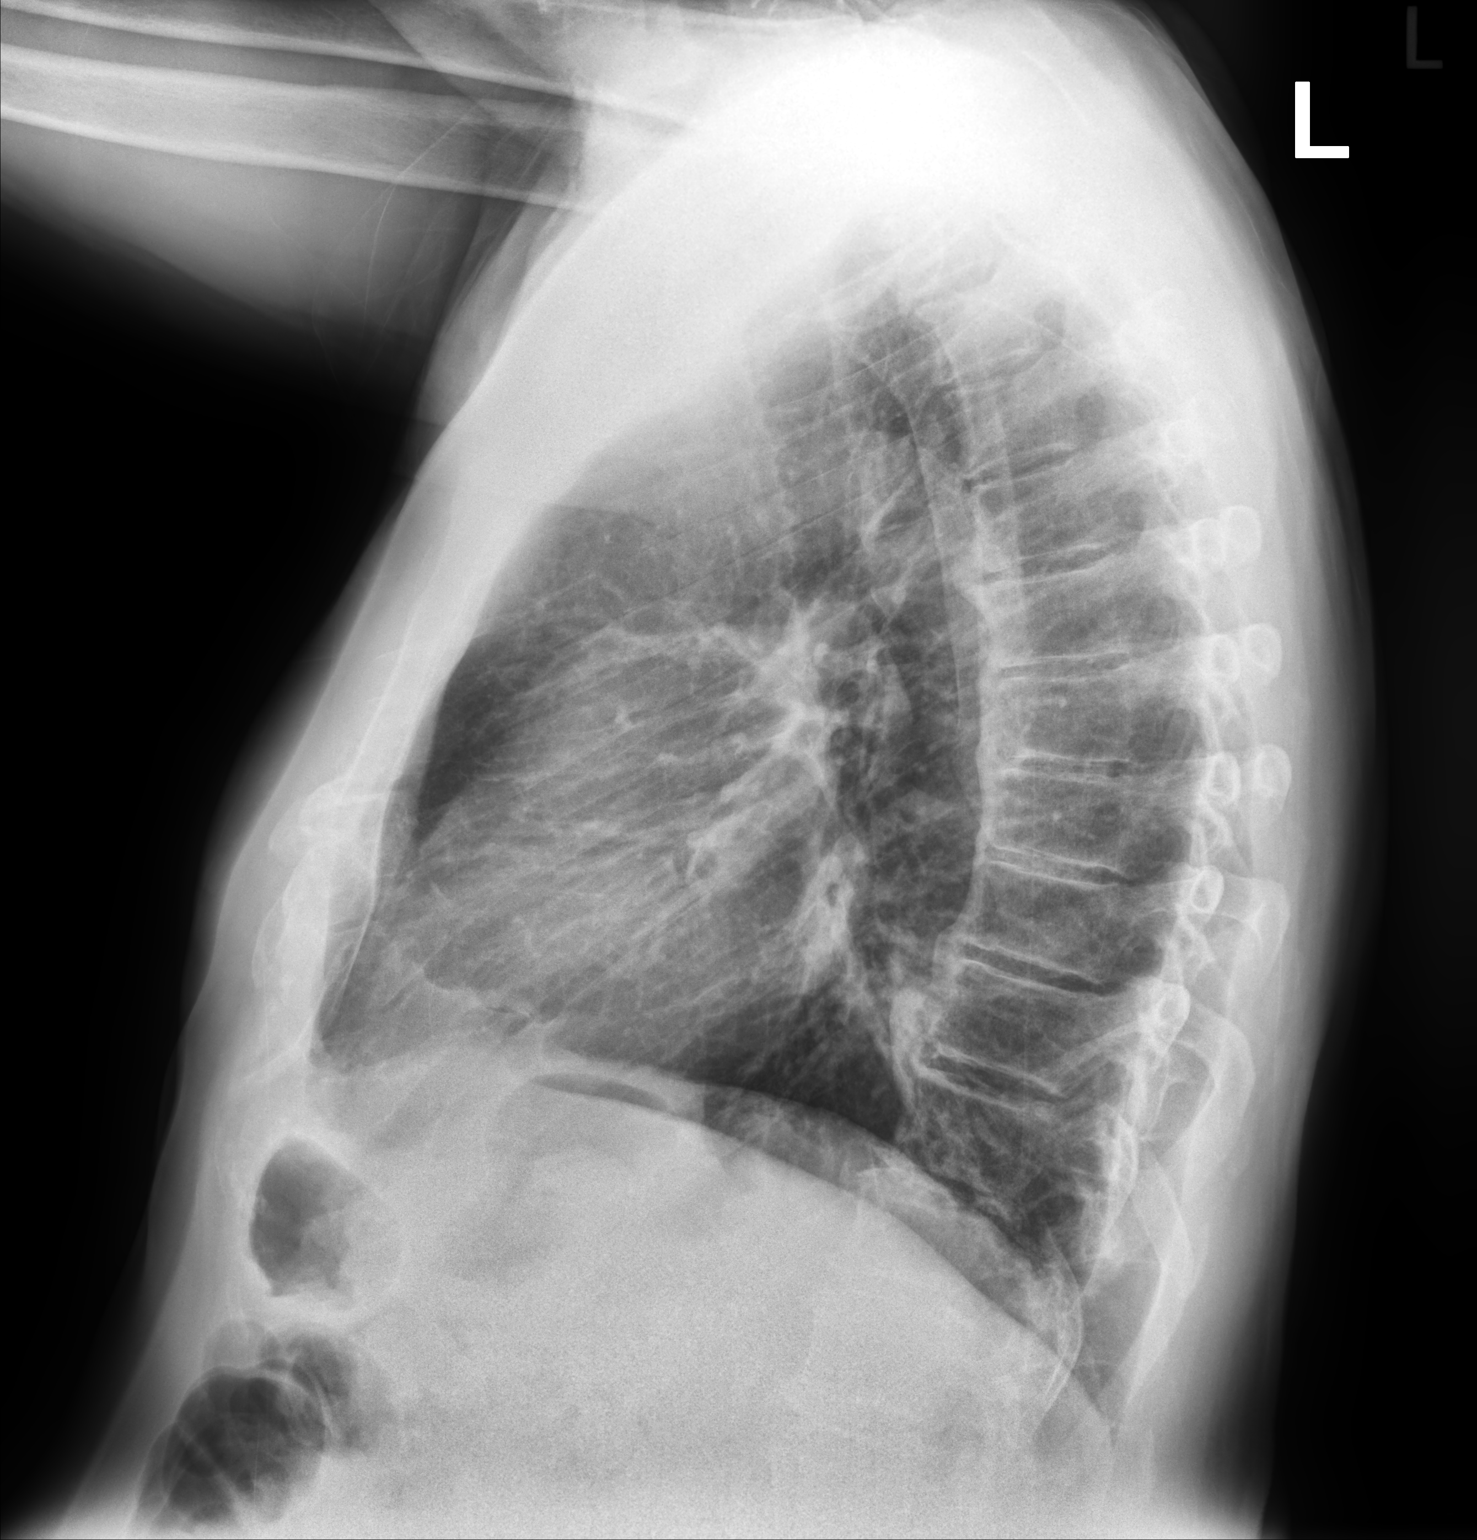

[2 of 2 positions shown; findings below may reference images not displayed]

FINDINGS: Increased interstitial opacity bilaterally. Linear scarring or
atelectasis left base. No consolidation or effusion. Normal heart
size. No pneumothorax.
IMPRESSION: Accentuated interstitial opacities, possibly acute interstitial
inflammatory process. No focal airspace disease.

## 2020-05-04 ENCOUNTER — Telehealth (INDEPENDENT_AMBULATORY_CARE_PROVIDER_SITE_OTHER): Payer: Medicare HMO | Admitting: Family Medicine

## 2020-05-04 ENCOUNTER — Encounter: Payer: Self-pay | Admitting: Family Medicine

## 2020-05-04 VITALS — BP 110/72 | Ht 69.0 in | Wt 190.0 lb

## 2020-05-04 DIAGNOSIS — M62830 Muscle spasm of back: Secondary | ICD-10-CM | POA: Diagnosis not present

## 2020-05-04 DIAGNOSIS — M545 Low back pain, unspecified: Secondary | ICD-10-CM

## 2020-05-04 MED ORDER — CYCLOBENZAPRINE HCL 10 MG PO TABS: 10.0000 mg | ORAL_TABLET | Freq: Three times a day (TID) | ORAL | 0 refills | Status: DC | PRN

## 2020-05-04 NOTE — Progress Notes (Signed)
Virtual Visit via Video Note  I connected with pt on 05/04/20 at  9:00 AM EDT by a video enabled telemedicine application and verified that I am speaking with the correct person using two identifiers.  Location patient: home Location provider:work or home office Persons participating in the virtual visit: patient, provider  I discussed the limitations of evaluation and management by telemedicine and the availability of in person appointments. The patient expressed understanding and agreed to proceed.  Telemedicine visit is a necessity given the COVID-19 restrictions in place at the current time.  HPI:  84 y/o WM being seen today for back pain. Sudden onset in morning about 3 d/a, L LB, feeling muscle spasms. Moving really slow helps.  No radiation of pain down legs.  No weakness, no paresthesias. Exactly like past episodes of musculoskeletal LBP. No flank pain. NO f/c/malaise.  Pt has hx of lumbar spondylosis.  Has long hx of recurrent bouts of LBP. I saw him for this particular problem last in April 2019, at which time he seemed to get some relief from prn flexeril.  ROS: See pertinent positives and negatives per HPI.  Past Medical History:  Diagnosis Date  . Anemia   . Basal cell carcinoma    neck (skin MD 2X per year)  . Bifascicular block 2021   ectopy, asymptomatic.  Cards->observe  . Bipolar disorder ([place])   . BPH (benign prostatic hyperplasia)    with elevated PSA; followed by Dr. Rosana Hoes at [facility]  . Chronic renal insufficiency, stage III (moderate)    in pt w/hx of renal transplant.  Post-transplant sCr 1.4-1.6.  Last renal f/u 05/01/19->Cr 1.63, GFR 39 ml/min  . Coronary atherosclerosis    LM and 3 V dz noted on CT 08/2019 performed for eval of interstitial lung dz in the setting of mild DOE and mild hypoxia. Cards->no stress tesing indicated->primary prevention emphasized  . CVA (cerebral vascular accident) ([place]) 11/2016   Pontine (vertebrobasilar--imaging  neg), TPA given.  Pt discharged on plavix.  Carotid dopplers ok, echocardiogram ok.  Residual deficit: vertigo but this improved greatly with therapy.  . DOE (dyspnea on exertion) 2020   DOE and ? hypoxia->CXR 08/29/19--->diffuse interstitial opacities-? acute inflammatory process->Dr. Melvyn Novas eval'd him and was underwhelmed by the CXR and exam->CT chest 09/26/19 "Very subtle areas of mild ground-glass attenuatio-nonspecific", mild air trapping. Per Dr. Melvyn Novas, no signif ILD, improving with inc ambulation (post-inflamm pulm fibrosis?)  . Gallbladder polyp 2015   Asymptomatic  . GERD (gastroesophageal reflux disease)   . Gout   . History of adenomatous polyp of colon 2018   Recall 5 yrs  . Hyperlipidemia   . Hypertension   . Hypothyroidism   . Lumbar spondylosis   . Ptosis due to aging    Right  . Rectal bleeding 09/2017   Admitted for obs due to Hb drop.  GI consulted---obs recommended.  No transfusion required.  Plavix was d/c'd, ferrous sulfate recommended.  Outpt GI f/u--plavix restarted and upper endoscopy was normal and colonoscopy showed 2 polyps and severe diverticular dz. Iron d/c'd 04/24/19.  Marland Kitchen Restless legs syndrome   . S/p cadaver renal transplant 2013   Secondary to HTN +lithium toxicity over 30 yrs caused kidney destruction [facility] transplant MDs)  . Seborrheic dermatitis    eyebrows worst: Hytone rx'd by Dr. Denna Haggard.  . 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 AV FISTULA PLACEMENT  04/08/2012   Procedure: ARTERIOVENOUS (AV) FISTULA CREATION;  Surgeon: Angelia Mould, MD;  Location: [facility] OR;  Service: Vascular;  Laterality: Left;  Creation of left brachial cephalic arteriovenous fistula  . CATARACT EXTRACTION, BILATERAL  05/2018  . COLONOSCOPY  2013;  11/24/17   2013; Normal except diverticulosis (recall 10 yrs).  2018 (for GI bleeding)--severe diverticular dz + 2 adenomatous polyps.  Recall 5 yrs.  . dilation for GERD    . ESOPHAGOGASTRODUODENOSCOPY  11/24/2017   gastric polyps x 2, otherwise normal.  . KIDNEY TRANSPLANT  11/06/12   [place] (cadaveric)  . PROSTATE BIOPSY  2011   no malignancy  . TRANSTHORACIC ECHOCARDIOGRAM  11/2016   Normal LV systolic fxn, EF 35-57%.  Grade I DD.  Mild aortic root dilatation, mild MV regurg.    Family History  Problem Relation Age of Onset  . Ulcers Mother        GI bleed   . Heart disease Father   . Bladder Cancer Father   . Colon cancer Neg Hx   . Esophageal cancer Neg Hx   . Pancreatic cancer Neg Hx   . Rectal cancer Neg Hx   . Stomach cancer Neg Hx     Current Outpatient Medications:  .  acetaminophen (TYLENOL) 325 MG tablet, Take 650 mg by mouth every 6 (six) hours as needed for mild pain., Disp: , Rfl:  .  allopurinol (ZYLOPRIM) 300 MG tablet, Take 1 tablet by mouth once daily, Disp: 90 tablet, Rfl: 1 .  carvedilol (COREG) 12.5 MG tablet, 1/2 tab po qAM and 1 tab po qPM, Disp: 135 tablet, Rfl: 3 .  Cholecalciferol (VITAMIN D) 2000 UNITS CAPS, Take 1 capsule by mouth daily., Disp: , Rfl:  .  clopidogrel (PLAVIX) 75 MG tablet, Take 1 tablet by mouth once daily, Disp: 90 tablet, Rfl: 1 .  colchicine 0.6 MG tablet, 2 tabs at onset of gout flare, then 1 tab one hour later, then starting the next day take 1 tab bid until gout flare is resolved, Disp: 20 tablet, Rfl: 1 .  EUTHYROX 25 MCG tablet, Take 1 tablet by mouth once daily, Disp: 90 tablet, Rfl: 3 .  hydrOXYzine (ATARAX/VISTARIL) 10 MG tablet, Take 10 mg by mouth 2 (two) times daily as needed. , Disp: , Rfl:  .  Melatonin 2.5 MG CAPS, Take 2 capsules by mouth at bedtime as needed., Disp: , Rfl:  .  mycophenolate (MYFORTIC) 180 MG EC tablet, Take 360 mg by mouth 2 (two) times daily. , Disp: , Rfl:  .  Omega-3 1000 MG CAPS, Take 2,000 mg  by mouth 2 (two) times daily. , Disp: , Rfl:  .  omeprazole (PRILOSEC) 20 MG capsule, Take 1 capsule by mouth once daily, Disp: 90 capsule, Rfl: 1 .  risperiDONE (RISPERDAL) 2 MG tablet, Take 1 mg by mouth at bedtime. 1mg  at bedtime, Disp: , Rfl:  .  simvastatin (ZOCOR) 20 MG tablet, Take 1 tablet by mouth once daily, Disp: 90 tablet, Rfl: 1 .  sulfamethoxazole-trimethoprim (BACTRIM,SEPTRA) 400-80 MG tablet, Take 1 tablet by mouth every Monday, Wednesday, and Friday., Disp: , Rfl:  .  tacrolimus (PROGRAF) 0.5 MG capsule, 2 in the am and 1 at night, Disp: , Rfl:  .  vitamin B-12 (CYANOCOBALAMIN) 100 MCG tablet, Take 100 mcg by mouth daily., Disp: , Rfl:   EXAM:  VITALS per patient if applicable: BP 503/54   Ht 5\' 9"  (1.753 m)   Wt 190 lb (86.2 kg)   BMI 28.06 kg/m    GENERAL: alert, oriented, appears well and in no acute distress  HEENT: atraumatic, conjunttiva clear, no obvious abnormalities on inspection of external nose and ears  NECK: normal movements of the head and neck  LUNGS: on inspection no signs of respiratory distress, breathing rate appears normal, no obvious gross SOB, gasping or wheezing  CV: no obvious cyanosis  MS: moves all visible extremities without noticeable abnormality  PSYCH/NEURO: pleasant and cooperative, no obvious depression or anxiety, speech and thought processing grossly intact  LABS: none today    Chemistry      Component Value Date/Time   NA 142 01/23/2020 1051   NA 139 08/16/2019 0000   K 4.4 01/23/2020 1051   CL 109 01/23/2020 1051   CO2 24 01/23/2020 1051   BUN 25 01/23/2020 1051   BUN 29 (A) 08/16/2019 0000   CREATININE 1.54 (H) 01/23/2020 1051   GLU 103 08/16/2019 0000      Component Value Date/Time   CALCIUM 9.1 01/23/2020 1051   CALCIUM 9.5 12/06/2010 0046   ALKPHOS 54 08/16/2019 0000   AST 12 01/23/2020 1051   ALT 9 01/23/2020 1051   BILITOT 0.7 01/23/2020 1051     Lab Results  Component Value Date   HGBA1C 5.9 (H)  01/23/2020   Lab Results  Component Value Date   WBC 4.9 08/16/2019   HGB 13.8 08/16/2019   HCT 41 08/16/2019   MCV 87.3 04/21/2019   PLT 180 08/16/2019    ASSESSMENT AND PLAN:  Discussed the following assessment and plan:  Acute muscle spasms L LB. Flexeril has significantly helped for this for him in the past. Flexeril 10mg , 1 tid prn, #30, no RF. Signs/symptoms to call or return for were reviewed and pt expressed understanding.  -we discussed possible serious and likely etiologies, options for evaluation and workup, limitations of telemedicine visit vs in person visit, treatment, treatment risks and precautions. Pt prefers to treat via telemedicine empirically rather then risking or undertaking an in person visit at this moment. Patient agrees to seek prompt in person care if worsening, new symptoms arise, or if is not improving with treatment.   I discussed the assessment and treatment plan with the patient. The patient was provided an opportunity to ask questions and all were answered. The patient agreed with the plan and demonstrated an understanding of the instructions.   The patient was advised to call back or seek an in-person evaluation if the symptoms worsen or if the condition fails to improve as anticipated.  F/u: prn  Signed:  Crissie Sickles, MD           05/04/2020

## 2020-05-10 ENCOUNTER — Other Ambulatory Visit: Payer: Self-pay | Admitting: Family Medicine

## 2020-05-22 DIAGNOSIS — Z940 Kidney transplant status: Secondary | ICD-10-CM | POA: Diagnosis not present

## 2020-05-22 DIAGNOSIS — I129 Hypertensive chronic kidney disease with stage 1 through stage 4 chronic kidney disease, or unspecified chronic kidney disease: Secondary | ICD-10-CM | POA: Diagnosis not present

## 2020-05-23 ENCOUNTER — Telehealth: Payer: Self-pay

## 2020-05-23 DIAGNOSIS — J029 Acute pharyngitis, unspecified: Secondary | ICD-10-CM | POA: Diagnosis not present

## 2020-05-23 NOTE — Telephone Encounter (Signed)
OK to schedule but has to be virtual.-thx

## 2020-05-23 NOTE — Telephone Encounter (Signed)
Made in error

## 2020-05-23 NOTE — Telephone Encounter (Signed)
Please advise, thanks.

## 2020-05-23 NOTE — Telephone Encounter (Signed)
Patients wife called in requesting patient be seen by Dr. Patient is having weakness, coughing and sore throat. She has checked his temp but says her thermometer is not really accurate. Wanting him to come in office to be seen, not sure if Dr will allow in office visit. There is a 3:00 available tomorrow but didn't know how I should schedule it     Please advise

## 2020-05-24 NOTE — Telephone Encounter (Signed)
Sent as FYI  [facility], notes are now available in epic.

## 2020-05-31 DIAGNOSIS — R739 Hyperglycemia, unspecified: Secondary | ICD-10-CM | POA: Diagnosis not present

## 2020-05-31 DIAGNOSIS — R0902 Hypoxemia: Secondary | ICD-10-CM | POA: Diagnosis not present

## 2020-05-31 DIAGNOSIS — M109 Gout, unspecified: Secondary | ICD-10-CM | POA: Diagnosis not present

## 2020-05-31 DIAGNOSIS — M7989 Other specified soft tissue disorders: Secondary | ICD-10-CM | POA: Diagnosis not present

## 2020-05-31 DIAGNOSIS — K922 Gastrointestinal hemorrhage, unspecified: Secondary | ICD-10-CM | POA: Diagnosis not present

## 2020-05-31 DIAGNOSIS — N186 End stage renal disease: Secondary | ICD-10-CM | POA: Diagnosis not present

## 2020-05-31 DIAGNOSIS — J028 Acute pharyngitis due to other specified organisms: Secondary | ICD-10-CM | POA: Diagnosis not present

## 2020-05-31 DIAGNOSIS — Z940 Kidney transplant status: Secondary | ICD-10-CM | POA: Diagnosis not present

## 2020-05-31 DIAGNOSIS — I129 Hypertensive chronic kidney disease with stage 1 through stage 4 chronic kidney disease, or unspecified chronic kidney disease: Secondary | ICD-10-CM | POA: Diagnosis not present

## 2020-05-31 DIAGNOSIS — Z20822 Contact with and (suspected) exposure to covid-19: Secondary | ICD-10-CM | POA: Diagnosis not present

## 2020-05-31 DIAGNOSIS — B9789 Other viral agents as the cause of diseases classified elsewhere: Secondary | ICD-10-CM | POA: Diagnosis not present

## 2020-05-31 DIAGNOSIS — Z03818 Encounter for observation for suspected exposure to other biological agents ruled out: Secondary | ICD-10-CM | POA: Diagnosis not present

## 2020-05-31 DIAGNOSIS — E669 Obesity, unspecified: Secondary | ICD-10-CM | POA: Diagnosis not present

## 2020-05-31 LAB — NOVEL CORONAVIRUS, NAA: SARS-CoV-2, NAA: NEGATIVE

## 2020-06-01 ENCOUNTER — Encounter: Payer: Self-pay | Admitting: Family Medicine

## 2020-06-03 ENCOUNTER — Telehealth: Payer: Self-pay

## 2020-06-03 HISTORY — PX: DG CHEST AP OR PA (ARMC HX): HXRAD1471

## 2020-06-03 NOTE — Telephone Encounter (Signed)
How about 4 on wednesday

## 2020-06-03 NOTE — Telephone Encounter (Signed)
Patient contacted and appt scheduled for 4:15 Wednesday.

## 2020-06-03 NOTE — Telephone Encounter (Signed)
Please advise if patient can be worked in this week

## 2020-06-03 NOTE — Telephone Encounter (Signed)
Patient called in needing a appt for persistent cough, lack of taste, and with no fever. Wanting to know if he could be working this week.    Please call and advise

## 2020-06-04 ENCOUNTER — Other Ambulatory Visit: Payer: Self-pay

## 2020-06-05 ENCOUNTER — Telehealth (INDEPENDENT_AMBULATORY_CARE_PROVIDER_SITE_OTHER): Payer: Medicare HMO | Admitting: Family Medicine

## 2020-06-05 ENCOUNTER — Other Ambulatory Visit: Payer: Self-pay

## 2020-06-05 ENCOUNTER — Encounter: Payer: Self-pay | Admitting: Family Medicine

## 2020-06-05 VITALS — BP 120/72 | Wt 176.0 lb

## 2020-06-05 DIAGNOSIS — R5383 Other fatigue: Secondary | ICD-10-CM | POA: Diagnosis not present

## 2020-06-05 DIAGNOSIS — J22 Unspecified acute lower respiratory infection: Secondary | ICD-10-CM | POA: Diagnosis not present

## 2020-06-05 DIAGNOSIS — R05 Cough: Secondary | ICD-10-CM

## 2020-06-05 DIAGNOSIS — R634 Abnormal weight loss: Secondary | ICD-10-CM | POA: Diagnosis not present

## 2020-06-05 DIAGNOSIS — R059 Cough, unspecified: Secondary | ICD-10-CM

## 2020-06-05 MED ORDER — SULFAMETHOXAZOLE-TRIMETHOPRIM 800-160 MG PO TABS: ORAL_TABLET | ORAL | 0 refills | Status: DC

## 2020-06-05 MED ORDER — BENZONATATE 200 MG PO CAPS: 200.0000 mg | ORAL_CAPSULE | Freq: Three times a day (TID) | ORAL | 2 refills | Status: DC | PRN

## 2020-06-05 NOTE — Progress Notes (Signed)
Virtual Visit via Video Note  I connected with pt on 06/05/20 at  4:00 PM EDT by a video enabled telemedicine application and verified that I am speaking with the correct person using two identifiers.  Location patient: home Location provider:work or home office Persons participating in the virtual visit: patient, provider  I discussed the limitations of evaluation and management by telemedicine and the availability of in person appointments. The patient expressed understanding and agreed to proceed.  Telemedicine visit is a necessity given the COVID-19 restrictions in place at the current time.  HPI: 84 y/o WM being seen today for persistent cough. Onset about 3 wks ago, severe ST was initial sx but this resolved after 2 wks. Onset of dry cough about 2 wks ago.  No fever.  Wife says he coughs all night but pt says he "sleeps through it".  Weak/fatigued, sleeps 12hrs a day.  Sense of taste is "crazy".  Has lost his appetite, has lost 13 lbs. He has had 2 negative covid tests during this illness, most recent [facility] d[facility] [place] CXR at the [place] 2 d/a---no report was called to the patient. NO SOB.  No wheezing or whistle from chest.  No hemoptysis.  No new nasal sx's. No HAs.  No abd pain, no n/v, no diarrhea.   Last 2 days: small amount of soup, 2 bites of sandwich, 1/2 gram cracker, 1/2 bowl raisin bran.  Smell is normal.  No known recent sick contacts.   Wife with mild ST, feeling chilly, and fatigue lately---just a lot milder than Don.  She is back to normal now.  Timmothy Sours feels like he is improving some last few days, particularly cough and ST.  ROS:  no cough, no dizziness, no HAs, no rashes, no melena/hematochezia.  No polyuria or polydipsia.  No myalgias or arthralgias.  No focal weakness, paresthesias, or tremors.  No acute vision or hearing abnormalities. No n/v/d or abd pain.  No palpitations.     Past Medical History:  Diagnosis Date  . Anemia   . Basal cell carcinoma    neck  (skin MD 2X per year)  . Bifascicular block 2021   ectopy, asymptomatic.  Cards->observe  . Bipolar disorder ([place])   . BPH (benign prostatic hyperplasia)    with elevated PSA; followed by Dr. Rosana Hoes at [facility]  . Chronic renal insufficiency, stage III (moderate)    in pt w/hx of renal transplant.  Post-transplant sCr 1.4-1.6.  Last renal f/u 05/01/19->Cr 1.63, GFR 39 ml/min  . Coronary atherosclerosis    LM and 3 V dz noted on CT 08/2019 performed for eval of interstitial lung dz in the setting of mild DOE and mild hypoxia. Cards->no stress tesing indicated->primary prevention emphasized  . CVA (cerebral vascular accident) ([place]) 11/2016   Pontine (vertebrobasilar--imaging neg), TPA given.  Pt discharged on plavix.  Carotid dopplers ok, echocardiogram ok.  Residual deficit: vertigo but this improved greatly with therapy.  . DOE (dyspnea on exertion) 2020   DOE and ? hypoxia->CXR 08/29/19--->diffuse interstitial opacities-? acute inflammatory process->Dr. Melvyn Novas eval'd him and was underwhelmed by the CXR and exam->CT chest 09/26/19 "Very subtle areas of mild ground-glass attenuatio-nonspecific", mild air trapping. Per Dr. Melvyn Novas, no signif ILD, improving with inc ambulation (post-inflamm pulm fibrosis?)  . Gallbladder polyp 2015   Asymptomatic  . GERD (gastroesophageal reflux disease)   . Gout   . History of adenomatous polyp of colon 2018   Recall 5 yrs  . Hyperlipidemia   .  Hypertension   . Hypothyroidism   . Lumbar spondylosis   . Ptosis due to aging    Right  . Rectal bleeding 09/2017   Admitted for obs due to Hb drop.  GI consulted---obs recommended.  No transfusion required.  Plavix was d/c'd, ferrous sulfate recommended.  Outpt GI f/u--plavix restarted and upper endoscopy was normal and colonoscopy showed 2 polyps and severe diverticular dz. Iron d/c'd 04/24/19.  Marland Kitchen Restless legs syndrome   . S/p cadaver renal transplant 2013   Secondary to HTN +lithium toxicity over 30 yrs caused  kidney destruction [facility] transplant MDs)  . Seborrheic dermatitis    eyebrows worst: Hytone rx'd by Dr. Denna Haggard.  . 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 AV FISTULA PLACEMENT  04/08/2012   Procedure: ARTERIOVENOUS (AV) FISTULA CREATION;  Surgeon: Angelia Mould, MD;  Location: [facility] OR;  Service: Vascular;  Laterality: Left;  Creation of left brachial cephalic arteriovenous fistula  . CATARACT EXTRACTION, BILATERAL  05/2018  . COLONOSCOPY  2013; 11/24/17   2013; Normal except diverticulosis (recall 10 yrs).  2018 (for GI bleeding)--severe diverticular dz + 2 adenomatous polyps.  Recall 5 yrs.  . dilation for GERD    . ESOPHAGOGASTRODUODENOSCOPY  11/24/2017   gastric polyps x 2, otherwise normal.  . KIDNEY TRANSPLANT  11/06/12   [place] (cadaveric)  . PROSTATE BIOPSY  2011   no malignancy  . TRANSTHORACIC ECHOCARDIOGRAM  11/2016   Normal LV systolic fxn, EF 14-48%.  Grade I DD.  Mild aortic root dilatation, mild MV regurg.    Family History  Problem Relation Age of Onset  . Ulcers Mother        GI bleed   . Heart disease Father   . Bladder Cancer Father   . Colon cancer Neg Hx   . Esophageal cancer Neg Hx   . Pancreatic cancer Neg Hx   . Rectal cancer Neg Hx   . Stomach cancer Neg Hx       Current Outpatient Medications:  .  acetaminophen (TYLENOL) 325 MG tablet, Take 650 mg by mouth every 6 (six) hours as needed for mild pain., Disp: , Rfl:  .  allopurinol (ZYLOPRIM) 300 MG tablet, Take 1 tablet by mouth once daily, Disp: 90 tablet, Rfl: 0 .  carvedilol (COREG) 12.5 MG tablet, 1/2 tab po qAM and 1 tab po qPM, Disp: 135 tablet, Rfl: 3 .  Cholecalciferol (VITAMIN D) 2000 UNITS CAPS, Take 1 capsule by mouth daily., Disp: , Rfl:  .  clopidogrel  (PLAVIX) 75 MG tablet, Take 1 tablet by mouth once daily, Disp: 90 tablet, Rfl: 0 .  EUTHYROX 25 MCG tablet, Take 1 tablet by mouth once daily, Disp: 90 tablet, Rfl: 3 .  famotidine (PEPCID) 10 MG tablet, Take by mouth., Disp: , Rfl:  .  hydrOXYzine (ATARAX/VISTARIL) 10 MG tablet, Take 10 mg by mouth 2 (two) times daily as needed. , Disp: , Rfl:  .  Melatonin 2.5 MG CAPS, Take 2 capsules by mouth at bedtime as needed., Disp: , Rfl:  .  mycophenolate (MYFORTIC) 180 MG EC tablet, Take 360 mg by mouth 2 (two) times daily. , Disp: , Rfl:  .  Omega-3 1000 MG CAPS, Take 2,000 mg by mouth 2 (two) times daily. , Disp: ,  Rfl:  .  omeprazole (PRILOSEC) 20 MG capsule, Take 1 capsule by mouth once daily, Disp: 90 capsule, Rfl: 1 .  risperiDONE (RISPERDAL) 1 MG tablet, Take 1 mg by mouth at bedtime. 1mg  at bedtime, Disp: , Rfl:  .  simvastatin (ZOCOR) 20 MG tablet, Take 1 tablet by mouth once daily, Disp: 90 tablet, Rfl: 1 .  sulfamethoxazole-trimethoprim (BACTRIM,SEPTRA) 400-80 MG tablet, Take 1 tablet by mouth every Monday, Wednesday, and Friday., Disp: , Rfl:  .  tacrolimus (PROGRAF) 0.5 MG capsule, 2 in the am and 1 at night, Disp: , Rfl:  .  vitamin B-12 (CYANOCOBALAMIN) 100 MCG tablet, Take 100 mcg by mouth daily., Disp: , Rfl:  .  colchicine 0.6 MG tablet, 2 tabs at onset of gout flare, then 1 tab one hour later, then starting the next day take 1 tab bid until gout flare is resolved (Patient not taking: Reported on 06/05/2020), Disp: 20 tablet, Rfl: 1  EXAM:  VITALS per patient if applicable: BP 782/95 (BP Location: Left Arm, Patient Position: Sitting, Cuff Size: Normal)   Wt 176 lb (79.8 kg)   SpO2 93%   BMI 25.99 kg/m    GENERAL: alert, oriented, appears well and in no acute distress  HEENT: atraumatic, conjunttiva clear, no obvious abnormalities on inspection of external nose and ears  NECK: normal movements of the head and neck  LUNGS: on inspection no signs of respiratory distress,  breathing rate appears normal, no obvious gross SOB, gasping or wheezing  CV: no obvious cyanosis  MS: moves all visible extremities without noticeable abnormality  PSYCH/NEURO: pleasant and cooperative, no obvious depression or anxiety, speech and thought processing grossly intact  ASSESSMENT AND PLAN:  Discussed the following assessment and plan:  Prolonged respiratory illness, sx's lower resp tract primarily now (with prominent fatigue and lack of appetite and + wt loss as well).  Pt immunosuppressed by mycophenolate and tacrolimus.  VERY unlikely covid 19 sine he has been vaccinated over 4 mo ago AND has had 2 neg covid tests during this illness. Viral etiology highly possible, but must consider PCP pneumonia as well. Will get CXR report from [place] from 2 d/a. I consulted UpToDate on rx of PCP pneumonia in NON-HIV infected pt's: dosing with GFR >30 ml/min was 15-20 mg/kg of the trimethoprim component dividied tid or qid.  I chose to rx TWO DS bactrim tabs bid for the next 21d. He will hold off on his usual bactrim prophylaxis dosing at this time. Tessalon pearls 200mg  tid prn cough eRx'd today.  No prednisone since I think risk of worsening his immunosuppression outweighs any anti-inflammatory in this instance.  Signs/symptoms to call or return for were reviewed and pt expressed understanding.    I discussed the assessment and treatment plan with the patient. The patient was provided an opportunity to ask questions and all were answered. The patient agreed with the plan and demonstrated an understanding of the instructions.   The patient was advised to call back or seek an in-person evaluation if the symptoms worsen or if the condition fails to improve as anticipated.  F/u: 1 wk  Signed:  Crissie Sickles, MD           06/05/2020

## 2020-06-08 ENCOUNTER — Encounter (HOSPITAL_COMMUNITY): Payer: Self-pay

## 2020-06-08 ENCOUNTER — Other Ambulatory Visit: Payer: Self-pay

## 2020-06-08 ENCOUNTER — Emergency Department (HOSPITAL_COMMUNITY): Payer: Medicare HMO

## 2020-06-08 ENCOUNTER — Emergency Department (HOSPITAL_COMMUNITY)
Admission: EM | Admit: 2020-06-08 | Discharge: 2020-06-08 | Disposition: A | Discharge status: Home / Self Care | Payer: Medicare HMO | Attending: Emergency Medicine | Admitting: Emergency Medicine

## 2020-06-08 DIAGNOSIS — Z940 Kidney transplant status: Secondary | ICD-10-CM | POA: Diagnosis not present

## 2020-06-08 DIAGNOSIS — I129 Hypertensive chronic kidney disease with stage 1 through stage 4 chronic kidney disease, or unspecified chronic kidney disease: Secondary | ICD-10-CM | POA: Diagnosis not present

## 2020-06-08 DIAGNOSIS — R05 Cough: Secondary | ICD-10-CM | POA: Diagnosis not present

## 2020-06-08 DIAGNOSIS — R059 Cough, unspecified: Secondary | ICD-10-CM

## 2020-06-08 DIAGNOSIS — Z85828 Personal history of other malignant neoplasm of skin: Secondary | ICD-10-CM | POA: Insufficient documentation

## 2020-06-08 DIAGNOSIS — Z20822 Contact with and (suspected) exposure to covid-19: Secondary | ICD-10-CM | POA: Diagnosis not present

## 2020-06-08 DIAGNOSIS — E039 Hypothyroidism, unspecified: Secondary | ICD-10-CM | POA: Insufficient documentation

## 2020-06-08 DIAGNOSIS — R1310 Dysphagia, unspecified: Secondary | ICD-10-CM | POA: Diagnosis not present

## 2020-06-08 DIAGNOSIS — J40 Bronchitis, not specified as acute or chronic: Secondary | ICD-10-CM | POA: Insufficient documentation

## 2020-06-08 DIAGNOSIS — Z79899 Other long term (current) drug therapy: Secondary | ICD-10-CM | POA: Insufficient documentation

## 2020-06-08 DIAGNOSIS — N183 Chronic kidney disease, stage 3 unspecified: Secondary | ICD-10-CM | POA: Diagnosis not present

## 2020-06-08 DIAGNOSIS — Z87891 Personal history of nicotine dependence: Secondary | ICD-10-CM | POA: Diagnosis not present

## 2020-06-08 DIAGNOSIS — Z8616 Personal history of COVID-19: Secondary | ICD-10-CM | POA: Insufficient documentation

## 2020-06-08 DIAGNOSIS — Z7902 Long term (current) use of antithrombotics/antiplatelets: Secondary | ICD-10-CM | POA: Insufficient documentation

## 2020-06-08 DIAGNOSIS — Z8673 Personal history of transient ischemic attack (TIA), and cerebral infarction without residual deficits: Secondary | ICD-10-CM | POA: Diagnosis not present

## 2020-06-08 DIAGNOSIS — J984 Other disorders of lung: Secondary | ICD-10-CM | POA: Diagnosis not present

## 2020-06-08 LAB — CBC
HCT: 45.2 % (ref 39.0–52.0)
Hemoglobin: 14.4 g/dL (ref 13.0–17.0)
MCH: 28.1 pg (ref 26.0–34.0)
MCHC: 31.9 g/dL (ref 30.0–36.0)
MCV: 88.3 fL (ref 80.0–100.0)
Platelets: 210 10*3/uL (ref 150–400)
RBC: 5.12 MIL/uL (ref 4.22–5.81)
RDW: 13.5 % (ref 11.5–15.5)
WBC: 7.2 10*3/uL (ref 4.0–10.5)
nRBC: 0 % (ref 0.0–0.2)

## 2020-06-08 LAB — BASIC METABOLIC PANEL
Anion gap: 12 (ref 5–15)
BUN: 32 mg/dL — ABNORMAL HIGH (ref 8–23)
CO2: 20 mmol/L — ABNORMAL LOW (ref 22–32)
Calcium: 9.2 mg/dL (ref 8.9–10.3)
Chloride: 106 mmol/L (ref 98–111)
Creatinine, Ser: 2.27 mg/dL — ABNORMAL HIGH (ref 0.61–1.24)
GFR calc Af Amer: 30 mL/min — ABNORMAL LOW (ref >60)
GFR calc non Af Amer: 26 mL/min — ABNORMAL LOW (ref >60)
Glucose, Bld: 106 mg/dL — ABNORMAL HIGH (ref 70–99)
Potassium: 4.7 mmol/L (ref 3.5–5.1)
Sodium: 138 mmol/L (ref 135–145)

## 2020-06-08 LAB — SARS CORONAVIRUS 2 BY RT PCR (HOSPITAL ORDER, PERFORMED IN ~~LOC~~ HOSPITAL LAB): SARS Coronavirus 2: NEGATIVE

## 2020-06-08 MED ORDER — ONDANSETRON 4 MG PO TBDP
4.0000 mg | ORAL_TABLET | Freq: Once | ORAL | Status: AC
  Administered 2020-06-08: 4 mg via ORAL
  Filled 2020-06-08: qty 1

## 2020-06-08 MED ORDER — ONDANSETRON 4 MG PO TBDP
4.0000 mg | ORAL_TABLET | Freq: Three times a day (TID) | ORAL | 0 refills | Status: DC | PRN
Start: 2020-06-08 — End: 2020-07-03

## 2020-06-08 NOTE — ED Provider Notes (Signed)
Medical screening examination/treatment/procedure(s) were conducted as a shared visit with non-physician practitioner(s) and myself.  I personally evaluated the patient during the encounter.      Patient with a complaint of cough nausea sore throat loss of taste for 2-1/2 weeks. Has had 2 - rapid Covid's. Also did have a chest x-ray done a few days ago at the [facility]. But no one knows the results. Saw his primary care doctor just couple days ago. And he was had a Bactrim dosage adjusted. Normally on prophylaxis for Bactrim. But increased. According the note it was to double strength twice a day. But it appears patient is taken three times a day. Patient does have immunosuppression due to kidney transplant. Patient did have Covid vaccine back in January. He still concerned about Covid will recheck. Patient nontoxic no acute distress. Throat without any acute findings. Lungs are clear. Oxygen saturation on room air today is 97%. No fever. Will also get chest x-ray to rule out pneumonia. Patient has pneumonia probably will adjust antibiotics. Definitely will need some antinausea medicine.   Fredia Sorrow, MD 06/08/20 1616

## 2020-06-08 NOTE — ED Triage Notes (Signed)
Patient c/o cough, nausea, sore throat, loss of taste x 2 1/2 weeks. Patient reports 2 negative rapid Covid tests. Patient also saw his PCP and states he was prescribed Bactrim and something for his cough.

## 2020-06-08 NOTE — ED Provider Notes (Signed)
[facility] DEPT Provider Note   CSN: [id number] Arrival date & time: 06/08/20  1302     History Chief Complaint  Patient presents with  . Cough  . Sore Throat  . loss of taste    Curtis Jimenez is a 84 y.o. male.  HPI  Patient is an 84 year old male with history of anemia, basal cell carcinoma, bipolar, BPH chronic renal insufficiency status post renal transplant, chronic dyspnea on exertion, reflux, HTN, HLD, hypothyroidism  Patient is presented today with complaint of cough, nausea, sore throat, loss of taste and smell for approximately 2.5 weeks.  He has had 2 rapid Covid tests that were negative at the [place] and [facility].  He also chest x-ray done at the [facility] however this was not resulted and patient has had no updates about the results.  Patient was seen by his primary care doctor several days ago and had a increase in his Bactrim dosage which she is normally on for prophylaxis because of his kidney transplant.  He states that because of the amount of Bactrim he is taking which is 2 tablets 3 times a day he has been having nausea which has been preventing him from taking any of his medications.  He states he has had 2 episodes of vomiting that was nonbloody nonbilious.  Patient denies any shortness of breath, chest pain, frequency, urgency or dysuria.  Denies any abdominal pain.  Denies any headache, lightheadedness or dizziness.  He states his cough is dry.  He has had some sinus congestion which he states he normally has but seems somewhat worse.  Denies any facial pain.    Past Medical History:  Diagnosis Date  . Anemia   . Basal cell carcinoma    neck (skin MD 2X per year)  . Bifascicular block 2021   ectopy, asymptomatic.  Cards->observe  . Bipolar disorder ([place])   . BPH (benign prostatic hyperplasia)    with elevated PSA; followed by Dr. Rosana Hoes at [facility]  . Chronic renal insufficiency, stage III (moderate)    in pt w/hx of  renal transplant.  Post-transplant sCr 1.4-1.6.  Last renal f/u 05/01/19->Cr 1.63, GFR 39 ml/min  . Coronary atherosclerosis    LM and 3 V dz noted on CT 08/2019 performed for eval of interstitial lung dz in the setting of mild DOE and mild hypoxia. Cards->no stress tesing indicated->primary prevention emphasized  . CVA (cerebral vascular accident) ([place]) 11/2016   Pontine (vertebrobasilar--imaging neg), TPA given.  Pt discharged on plavix.  Carotid dopplers ok, echocardiogram ok.  Residual deficit: vertigo but this improved greatly with therapy.  . DOE (dyspnea on exertion) 2020   DOE and ? hypoxia->CXR 08/29/19--->diffuse interstitial opacities-? acute inflammatory process->Dr. Melvyn Novas eval'd him and was underwhelmed by the CXR and exam->CT chest 09/26/19 "Very subtle areas of mild ground-glass attenuatio-nonspecific", mild air trapping. Per Dr. Melvyn Novas, no signif ILD, improving with inc ambulation (post-inflamm pulm fibrosis?)  . Gallbladder polyp 2015   Asymptomatic  . GERD (gastroesophageal reflux disease)   . Gout   . History of adenomatous polyp of colon 2018   Recall 5 yrs  . Hyperlipidemia   . Hypertension   . Hypothyroidism   . Lumbar spondylosis   . Ptosis due to aging    Right  . Rectal bleeding 09/2017   Admitted for obs due to Hb drop.  GI consulted---obs recommended.  No transfusion required.  Plavix was d/c'd, ferrous sulfate recommended.  Outpt GI f/u--plavix restarted and  upper endoscopy was normal and colonoscopy showed 2 polyps and severe diverticular dz. Iron d/c'd 04/24/19.  Marland Kitchen Restless legs syndrome   . S/p cadaver renal transplant 2013   Secondary to HTN +lithium toxicity over 30 yrs caused kidney destruction [facility] transplant MDs)  . Seborrheic dermatitis    eyebrows worst: Hytone rx'd by Dr. Denna Haggard.  . 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 Squamous cell carcinoma in situ (SCCIS) of skin of right  forearm 08/14/2019   right forearm-txpbx    Patient Active Problem List   Diagnosis Date Noted  . Postinflammatory pulmonary fibrosis ([place]) 08/30/2019  . DOE (dyspnea on exertion) 08/29/2019  . Overweight (BMI 25.0-29.9) 07/27/2019  . GIB (gastrointestinal bleeding) 10/07/2017  . IFG (impaired fasting glucose) 06/08/2017  . Stroke ([place]) 11/30/2016  . Essential hypertension 01/16/2015  . S/p cadaver renal transplant 11/06/2012  . Chronic kidney disease (CKD), stage IV (severe) ([place]) 03/18/2012  . BENIGN PROSTATIC HYPERTROPHY 04/18/2008  . EXTRINSIC ASTHMA, UNSPECIFIED 12/19/2007  . PSA, INCREASED 12/09/2007  . Hypothyroidism 08/12/2007  . Bipolar disorder ([place]) 07/27/2007  . Hyperlipidemia 07/07/2007  . Gout 07/07/2007  . HYPERTENSION 07/07/2007  . GERD 07/07/2007    Past Surgical History:  Procedure Laterality Date  . AV FISTULA PLACEMENT  04/08/2012   Procedure: ARTERIOVENOUS (AV) FISTULA CREATION;  Surgeon: Angelia Mould, MD;  Location: [facility] OR;  Service: Vascular;  Laterality: Left;  Creation of left brachial cephalic arteriovenous fistula  . CATARACT EXTRACTION, BILATERAL  05/2018  . COLONOSCOPY  2013; 11/24/17   2013; Normal except diverticulosis (recall 10 yrs).  2018 (for GI bleeding)--severe diverticular dz + 2 adenomatous polyps.  Recall 5 yrs.  . DG CHEST AP OR PA ([facility] HX)  06/03/2020   Bibasilar atelectasis. No focal consoliadation definitively identified.  . dilation for GERD    . ESOPHAGOGASTRODUODENOSCOPY  11/24/2017   gastric polyps x 2, otherwise normal.  . KIDNEY TRANSPLANT  11/06/12   [place] (cadaveric)  . PROSTATE BIOPSY  2011   no malignancy  . TRANSTHORACIC ECHOCARDIOGRAM  11/2016   Normal LV systolic fxn, EF 34-74%.  Grade I DD.  Mild aortic root dilatation, mild MV regurg.       Family History  Problem Relation Age of Onset  . Ulcers Mother        GI bleed   . Heart disease Father   . Bladder Cancer Father   . Colon cancer Neg Hx   .  Esophageal cancer Neg Hx   . Pancreatic cancer Neg Hx   . Rectal cancer Neg Hx   . Stomach cancer Neg Hx     Social History   Tobacco Use  . Smoking status: Former Smoker    Years: 16.00    Types: Pipe, Cigars    Quit date: 05/11/1974    Years since quitting: 46.1  . Smokeless tobacco: Never Used  Vaping Use  . Vaping Use: Never used  Substance Use Topics  . Alcohol use: No  . Drug use: No    Home Medications Prior to Admission medications   Medication Sig Start Date End Date Taking? Authorizing Provider  acetaminophen (TYLENOL) 325 MG tablet Take 650 mg by mouth every 6 (six) hours as needed for mild pain.    [provider]  allopurinol (ZYLOPRIM) 300 MG tablet Take 1 tablet by mouth once daily 05/10/20   McGowen, Adrian Blackwater, MD  benzonatate (TESSALON) 200 MG capsule Take  1 capsule (200 mg total) by mouth 3 (three) times daily as needed for cough. 06/05/20   McGowen, Adrian Blackwater, MD  carvedilol (COREG) 12.5 MG tablet 1/2 tab po qAM and 1 tab po qPM 07/27/19   McGowen, Adrian Blackwater, MD  Cholecalciferol (VITAMIN D) 2000 UNITS CAPS Take 1 capsule by mouth daily.    [provider]  clopidogrel (PLAVIX) 75 MG tablet Take 1 tablet by mouth once daily 05/10/20   McGowen, Adrian Blackwater, MD  colchicine 0.6 MG tablet 2 tabs at onset of gout flare, then 1 tab one hour later, then starting the next day take 1 tab bid until gout flare is resolved Patient not taking: Reported on 06/05/2020 04/01/16   Tammi Sou, MD  EUTHYROX 25 MCG tablet Take 1 tablet by mouth once daily 08/07/19   McGowen, Adrian Blackwater, MD  famotidine (PEPCID) 10 MG tablet Take by mouth.    [provider]  hydrOXYzine (ATARAX/VISTARIL) 10 MG tablet Take 10 mg by mouth 2 (two) times daily as needed.     [provider]  Melatonin 2.5 MG CAPS Take 2 capsules by mouth at bedtime as needed.    [provider]  mycophenolate (MYFORTIC) 180 MG EC tablet Take 360 mg by mouth 2 (two) times daily.      [provider]  Omega-3 1000 MG CAPS Take 2,000 mg by mouth 2 (two) times daily.  12/06/12   [provider]  omeprazole (PRILOSEC) 20 MG capsule Take 1 capsule by mouth once daily 09/27/19   McGowen, Adrian Blackwater, MD  ondansetron (ZOFRAN ODT) 4 MG disintegrating tablet Take 1 tablet (4 mg total) by mouth every 8 (eight) hours as needed for nausea or vomiting. 06/08/20   Tedd Sias, PA  risperiDONE (RISPERDAL) 1 MG tablet Take 1 mg by mouth at bedtime. 1mg  at bedtime    [provider]  simvastatin (ZOCOR) 20 MG tablet Take 1 tablet by mouth once daily 01/24/20   McGowen, Adrian Blackwater, MD  sulfamethoxazole-trimethoprim (BACTRIM DS) 800-160 MG tablet 2 tabs po tid x 21 days 06/05/20   Tammi Sou, MD  sulfamethoxazole-trimethoprim (BACTRIM,SEPTRA) 400-80 MG tablet Take 1 tablet by mouth every Monday, Wednesday, and Friday.    [provider]  tacrolimus (PROGRAF) 0.5 MG capsule 2 in the am and 1 at night    [provider]  vitamin B-12 (CYANOCOBALAMIN) 100 MCG tablet Take 100 mcg by mouth daily.    [provider]    Allergies    Amantadine hcl and Chlorpromazine hcl  Review of Systems   Review of Systems  Constitutional: Positive for appetite change and fatigue. Negative for chills and fever.       Weight loss due to decreased intake   HENT: Positive for congestion.   Eyes: Negative for pain.  Respiratory: Positive for cough. Negative for shortness of breath.   Cardiovascular: Negative for chest pain and leg swelling.  Gastrointestinal: Negative for abdominal pain and vomiting.  Genitourinary: Negative for dysuria.  Musculoskeletal: Negative for myalgias.  Skin: Negative for rash.  Neurological: Negative for dizziness and headaches.    Physical Exam Updated Vital Signs BP 135/78   Pulse 72   Temp 98 F (36.7 C) (Oral)   Resp (!) 48   Ht 5\' 9"  (1.753 m)   Wt 79.2 kg   SpO2 94%   BMI 25.77 kg/m   Physical Exam Vitals and  nursing note reviewed.  Constitutional:  General: He is not in acute distress.    Comments: Patient is a slender 85 year old male appears stated age.  Pleasant, able answer questions appropriately and follow commands.  HENT:     Head: Normocephalic and atraumatic.     Nose: Nose normal.     Mouth/Throat:     Mouth: Mucous membranes are moist.     Comments: No posterior pharynx erythema, no injection, tonsils are difficult to see/possibly surgically absent. Eyes:     General: No scleral icterus.    Extraocular Movements: Extraocular movements intact.     Pupils: Pupils are equal, round, and reactive to light.  Neck:     Comments: No JVD Cardiovascular:     Rate and Rhythm: Normal rate and regular rhythm.     Pulses: Normal pulses.     Heart sounds: Normal heart sounds.  Pulmonary:     Effort: Pulmonary effort is normal. No respiratory distress.     Breath sounds: No wheezing.     Comments: Patient speaking full sentences, normal effort with no increased work of breathing, no nasal flaring or retractions, respiratory rate is normal.  Lungs are clear apart from very faint crackles in bilateral bases. Abdominal:     Palpations: Abdomen is soft.     Tenderness: There is no abdominal tenderness. There is no right CVA tenderness or left CVA tenderness.  Musculoskeletal:     Cervical back: Normal range of motion.     Right lower leg: No edema.     Left lower leg: No edema.  Skin:    General: Skin is warm and dry.     Capillary Refill: Capillary refill takes less than 2 seconds.  Neurological:     Mental Status: He is alert. Mental status is at baseline.  Psychiatric:        Mood and Affect: Mood normal.        Behavior: Behavior normal.     ED Results / Procedures / Treatments   Labs (all labs ordered are listed, but only abnormal results are displayed) Labs Reviewed  BASIC METABOLIC PANEL - Abnormal; Notable for the following components:      Result Value   CO2 20 (*)     Glucose, Bld 106 (*)    BUN 32 (*)    Creatinine, Ser 2.27 (*)    GFR calc non Af Amer 26 (*)    GFR calc Af Amer 30 (*)    All other components within normal limits  SARS CORONAVIRUS 2 BY RT PCR (HOSPITAL ORDER, [place] [facility])  CBC    EKG None  Radiology DG Chest 2 View  Result Date: 06/08/2020 CLINICAL DATA:  Cough and difficulty swallowing for the past 2 weeks. EXAM: CHEST - 2 VIEW COMPARISON:  CT chest dated November 26, 2019. Chest x-ray dated August 29, 2019. FINDINGS: The heart size and mediastinal contours are within normal limits. Both lungs are clear. The visualized skeletal structures are unremarkable. IMPRESSION: No active cardiopulmonary disease. Electronically Signed   By: Titus Dubin M.D.   On: 06/08/2020 16:10    Procedures Procedures (including critical care time)  Medications Ordered in ED Medications  ondansetron (ZOFRAN-ODT) disintegrating tablet 4 mg (4 mg Oral Given 06/08/20 1621)    ED Course  I have reviewed the triage vital signs and the nursing notes.  Pertinent labs & imaging results that were available during my care of the patient were reviewed by me and considered in my medical decision  making (see chart for details).    MDM Rules/Calculators/A&P                          Patient is 84 year old male presented today with cough recently diagnosed with bronchitis he has had increased dosage of his chronic Bactrim which she is on for prophylaxis because of his kidney transplant.  He states that when taking the Bactrim makes him nauseous and he has had some episodes of vomiting and difficulty keeping it down.  He has had some weight loss secondary to the nausea he feels that he is unable to eat as much.  Physical exam patient is well-appearing.  He is coughing occasionally but is satting a normal limits on room air on my evaluation oxygen saturation is 98% his respiratory rate is 16 with heart rate of 75. His lungs sound  clear with some very faint crackles in bilateral bases which may be atelectasis.  Will obtain chest x-ray, basic labs given his poor p.o. intake and his history of kidney transplant.  Patient is creatinine is mildly elevated but does not meet criteria for AKI.  He is tolerating p.o. after Zofran states he has no nausea currently.  Chest x-ray is without any acute abnormality agree with radiology read on this.  Covid test negative.  CBC without leukocytosis or anemia.  BMP without any significant abnormalities apart from those detailed above.  I discussed this case with my attending physician who cosigned this note including patient's presenting symptoms, physical exam, and planned diagnostics and interventions. Attending physician stated agreement with plan or made changes to plan which were implemented.   Attending physician assessed patient at bedside.  Patient will follow up closely early next week with his primary care doctor or nephrologist for reevaluation skin function.  He understands this is very important.  He is given return precautions.  He is also discharged with Zofran which he can use for nausea.  Final Clinical Impression(s) / ED Diagnoses Final diagnoses:  Cough  Bronchitis    Rx / DC Orders ED Discharge Orders         Ordered    ondansetron (ZOFRAN ODT) 4 MG disintegrating tablet  Every 8 hours PRN     Discontinue  Reprint     06/08/20 [place], Dany Walther [place], [place] 06/08/20 1931    Fredia Sorrow, MD 06/17/20 1711

## 2020-06-08 NOTE — Discharge Instructions (Addendum)
Please take Zofran as needed for nausea.  Please drink plenty of water and eat plenty of food.  Gentle exercises encouraged.  Gargle salt water and you may use a Nettie pot for sinus rinses.  Please continue to take your antibiotic as prescribed by your primary care doctor.  Please follow-up with your primary care doctor or nephrologist early next week to have your kidney function rechecked.  Please use benzonatate which you already been prescribed for cough.

## 2020-06-10 ENCOUNTER — Encounter: Payer: Self-pay | Admitting: Family Medicine

## 2020-06-10 ENCOUNTER — Telehealth: Payer: Self-pay

## 2020-06-10 DIAGNOSIS — R059 Cough, unspecified: Secondary | ICD-10-CM

## 2020-06-10 DIAGNOSIS — N183 Chronic kidney disease, stage 3 unspecified: Secondary | ICD-10-CM

## 2020-06-10 NOTE — Telephone Encounter (Signed)
Please advise, thanks.

## 2020-06-10 NOTE — Telephone Encounter (Signed)
Patients wife is wanting him to come in today to get labs done, I let her know there were no lab orders in and that we had to let the Dr review in order for orders to be created. She would like a call back today if possible    Please call and advise

## 2020-06-10 NOTE — Telephone Encounter (Signed)
OK, decrease the bactrim (sulfamethoxazole-trimethaprim) to ONE of the 800-160 tabs ONCE per day for the next 7 days. NOn-fasting lab visit ASAP, I'll enter orders. Only take the nausea med the emergency dept rx'd him IF he has nausea (this med may be making him tired). Emphasize drinking frequent small sips of clear fluids like water, gatorade, or poweraid.

## 2020-06-10 NOTE — Telephone Encounter (Signed)
Patient evaluated in ER.    [place] Night - Client TELEPHONE ADVICE RECORD Access[facility]urse Patient Name: Curtis Jimenez Gender: Male DOB: [birth year] Age: 84 Y 6 M Return Phone Number: [phone] (Primary), [phone] (Secondary) Address[place] [address] Client [facility] - Client Client Site [place] Night Physician Crissie Sickles - MD Contact Type Call Who Is Calling Patient / Member / Family / Caregiver Call Type Triage / Clinical Caller Name Curtis Jimenez Relationship To Patient Spouse Return Phone Number [phone] (Primary) Chief Complaint Vomiting Reason for Call Symptomatic / Request for [place] states husband had a virtual visit Thurs afternoon and he is still sick. He is not holding down much food none basically, cannot swallow his pills, and he is nauseous. Translation No Nurse Assessment Nurse: Curtis Phenix, RN, Lelan Pons Date/Time Eilene Ghazi Time): 06/08/2020 11:40:30 AM Confirm and document reason for call. If symptomatic, describe symptoms. ---Caller states husband had a virtual visit Thurs afternoon and he is still vomiting 2-3 times a day. He is a kidney transplant patient and cannot swallow his pills or keep down water or food. Has had sore throat for 2 weeks. Denies fever. 2 negative covid tests. Has the patient had close contact with a person known or suspected to have the novel coronavirus illness OR traveled / lives in area with major community spread (including international travel) in the last 14 days from the onset of symptoms? * If Asymptomatic, screen for exposure and travel within the last 14 days. ---No Does the patient have any new or worsening symptoms? ---Yes Will a triage be completed? ---Yes Related visit to physician within the last 2 weeks? ---No Does the PT have any chronic conditions? (i.e. diabetes, asthma, this  includes High risk factors for pregnancy, etc.) ---Yes List chronic conditions. ---kidney transplant, gout, thyroid, takes immune therapy meds. Is this a behavioral health or substance abuse call? ---No Guidelines Guideline Title Affirmed Question Affirmed Notes Nurse Date/Time (Eastern Time) Vomiting High-risk adult (e.g., diabetes mellitus, brain tumor, V-P shunt, hernia) Curtis Phenix, RN, Lelan Pons 06/08/2020 11:43:55 AMPLEASE NOTE: All timestamps contained within this report are represented as [place] Standard Time. CONFIDENTIALTY NOTICE: This fax transmission is intended only for the addressee. It contains information that is legally privileged, confidential or otherwise protected from use or disclosure. If you are not the intended recipient, you are strictly prohibited from reviewing, disclosing, copying using or disseminating any of this information or taking any action in reliance on or regarding this information. If you have received this fax in error, please notify us immediately by telephone so that we can arrange for its return to [place]. Phone: [phone], Toll-Free: [phone], Fax: [phone] Page: 2 of 2 Call Id: [id number] [place]. Time Eilene Ghazi Time) Disposition Final User 06/08/2020 11:56:23 AM Go to ED Now (or PCP triage) Yes Curtis Phenix, RN, [place] Disagree/Comply Comply Caller Understands Yes PreDisposition Did not know what to do Care Advice Given Per Guideline GO TO ED NOW (OR PCP TRIAGE): * IF NO PCP (PRIMARY CARE PROVIDER) SECOND-LEVEL TRIAGE: You need to be seen within the next hour. Go to the [place] at _____________ [place] as soon as you can. Referrals [facility] - E

## 2020-06-11 ENCOUNTER — Other Ambulatory Visit: Payer: Self-pay

## 2020-06-11 ENCOUNTER — Ambulatory Visit (INDEPENDENT_AMBULATORY_CARE_PROVIDER_SITE_OTHER): Payer: Medicare HMO | Admitting: Family Medicine

## 2020-06-11 DIAGNOSIS — R059 Cough, unspecified: Secondary | ICD-10-CM

## 2020-06-11 DIAGNOSIS — R05 Cough: Secondary | ICD-10-CM | POA: Diagnosis not present

## 2020-06-11 DIAGNOSIS — N183 Chronic kidney disease, stage 3 unspecified: Secondary | ICD-10-CM

## 2020-06-12 ENCOUNTER — Inpatient Hospital Stay (HOSPITAL_COMMUNITY)
Admission: AD | Admit: 2020-06-12 | Discharge: 2020-06-14 | DRG: 392 | Disposition: A | Discharge status: Home / Self Care | Payer: Medicare HMO | Source: Ambulatory Visit | Attending: Internal Medicine | Admitting: Internal Medicine

## 2020-06-12 ENCOUNTER — Other Ambulatory Visit: Payer: Self-pay

## 2020-06-12 ENCOUNTER — Emergency Department (HOSPITAL_COMMUNITY): Payer: Medicare HMO

## 2020-06-12 ENCOUNTER — Encounter: Payer: Self-pay | Admitting: Family Medicine

## 2020-06-12 ENCOUNTER — Encounter (HOSPITAL_COMMUNITY): Payer: Self-pay

## 2020-06-12 DIAGNOSIS — R1110 Vomiting, unspecified: Secondary | ICD-10-CM | POA: Diagnosis present

## 2020-06-12 DIAGNOSIS — R1310 Dysphagia, unspecified: Secondary | ICD-10-CM | POA: Diagnosis present

## 2020-06-12 DIAGNOSIS — R059 Cough, unspecified: Secondary | ICD-10-CM

## 2020-06-12 DIAGNOSIS — F319 Bipolar disorder, unspecified: Secondary | ICD-10-CM | POA: Diagnosis present

## 2020-06-12 DIAGNOSIS — T8619 Other complication of kidney transplant: Secondary | ICD-10-CM | POA: Diagnosis present

## 2020-06-12 DIAGNOSIS — R05 Cough: Secondary | ICD-10-CM | POA: Diagnosis present

## 2020-06-12 DIAGNOSIS — K648 Other hemorrhoids: Secondary | ICD-10-CM | POA: Diagnosis present

## 2020-06-12 DIAGNOSIS — I2510 Atherosclerotic heart disease of native coronary artery without angina pectoris: Secondary | ICD-10-CM | POA: Diagnosis present

## 2020-06-12 DIAGNOSIS — K222 Esophageal obstruction: Secondary | ICD-10-CM | POA: Diagnosis not present

## 2020-06-12 DIAGNOSIS — Q402 Other specified congenital malformations of stomach: Secondary | ICD-10-CM

## 2020-06-12 DIAGNOSIS — Z79890 Hormone replacement therapy (postmenopausal): Secondary | ICD-10-CM

## 2020-06-12 DIAGNOSIS — R112 Nausea with vomiting, unspecified: Secondary | ICD-10-CM | POA: Diagnosis not present

## 2020-06-12 DIAGNOSIS — Z8249 Family history of ischemic heart disease and other diseases of the circulatory system: Secondary | ICD-10-CM

## 2020-06-12 DIAGNOSIS — Z86008 Personal history of in-situ neoplasm of other site: Secondary | ICD-10-CM

## 2020-06-12 DIAGNOSIS — Z20822 Contact with and (suspected) exposure to covid-19: Secondary | ICD-10-CM | POA: Diagnosis present

## 2020-06-12 DIAGNOSIS — Z85828 Personal history of other malignant neoplasm of skin: Secondary | ICD-10-CM

## 2020-06-12 DIAGNOSIS — I10 Essential (primary) hypertension: Secondary | ICD-10-CM | POA: Diagnosis not present

## 2020-06-12 DIAGNOSIS — D649 Anemia, unspecified: Secondary | ICD-10-CM | POA: Diagnosis present

## 2020-06-12 DIAGNOSIS — Z940 Kidney transplant status: Secondary | ICD-10-CM

## 2020-06-12 DIAGNOSIS — Z8673 Personal history of transient ischemic attack (TIA), and cerebral infarction without residual deficits: Secondary | ICD-10-CM

## 2020-06-12 DIAGNOSIS — Z8052 Family history of malignant neoplasm of bladder: Secondary | ICD-10-CM

## 2020-06-12 DIAGNOSIS — Z79899 Other long term (current) drug therapy: Secondary | ICD-10-CM

## 2020-06-12 DIAGNOSIS — E872 Acidosis, unspecified: Secondary | ICD-10-CM

## 2020-06-12 DIAGNOSIS — E860 Dehydration: Secondary | ICD-10-CM | POA: Diagnosis present

## 2020-06-12 DIAGNOSIS — R531 Weakness: Secondary | ICD-10-CM

## 2020-06-12 DIAGNOSIS — Z86010 Personal history of colonic polyps: Secondary | ICD-10-CM

## 2020-06-12 DIAGNOSIS — K2100 Gastro-esophageal reflux disease with esophagitis, without bleeding: Secondary | ICD-10-CM | POA: Diagnosis present

## 2020-06-12 DIAGNOSIS — E039 Hypothyroidism, unspecified: Secondary | ICD-10-CM | POA: Diagnosis present

## 2020-06-12 DIAGNOSIS — R972 Elevated prostate specific antigen [PSA]: Secondary | ICD-10-CM | POA: Diagnosis present

## 2020-06-12 DIAGNOSIS — M109 Gout, unspecified: Secondary | ICD-10-CM | POA: Diagnosis present

## 2020-06-12 DIAGNOSIS — R634 Abnormal weight loss: Secondary | ICD-10-CM

## 2020-06-12 DIAGNOSIS — K317 Polyp of stomach and duodenum: Secondary | ICD-10-CM | POA: Diagnosis present

## 2020-06-12 DIAGNOSIS — N179 Acute kidney failure, unspecified: Secondary | ICD-10-CM

## 2020-06-12 DIAGNOSIS — I4510 Unspecified right bundle-branch block: Secondary | ICD-10-CM | POA: Diagnosis present

## 2020-06-12 DIAGNOSIS — N400 Enlarged prostate without lower urinary tract symptoms: Secondary | ICD-10-CM | POA: Diagnosis present

## 2020-06-12 DIAGNOSIS — Y830 Surgical operation with transplant of whole organ as the cause of abnormal reaction of the patient, or of later complication, without mention of misadventure at the time of the procedure: Secondary | ICD-10-CM | POA: Diagnosis present

## 2020-06-12 DIAGNOSIS — I129 Hypertensive chronic kidney disease with stage 1 through stage 4 chronic kidney disease, or unspecified chronic kidney disease: Secondary | ICD-10-CM | POA: Diagnosis present

## 2020-06-12 DIAGNOSIS — I452 Bifascicular block: Secondary | ICD-10-CM | POA: Diagnosis present

## 2020-06-12 DIAGNOSIS — N183 Chronic kidney disease, stage 3 unspecified: Secondary | ICD-10-CM | POA: Diagnosis present

## 2020-06-12 DIAGNOSIS — E785 Hyperlipidemia, unspecified: Secondary | ICD-10-CM | POA: Diagnosis present

## 2020-06-12 DIAGNOSIS — T43595A Adverse effect of other antipsychotics and neuroleptics, initial encounter: Secondary | ICD-10-CM | POA: Diagnosis present

## 2020-06-12 DIAGNOSIS — Z87891 Personal history of nicotine dependence: Secondary | ICD-10-CM

## 2020-06-12 DIAGNOSIS — K5730 Diverticulosis of large intestine without perforation or abscess without bleeding: Secondary | ICD-10-CM | POA: Diagnosis present

## 2020-06-12 DIAGNOSIS — Z7902 Long term (current) use of antithrombotics/antiplatelets: Secondary | ICD-10-CM

## 2020-06-12 LAB — CBC WITH DIFFERENTIAL/PLATELET
Abs Immature Granulocytes: 0.03 10*3/uL (ref 0.00–0.07)
Absolute Monocytes: 546 cells/uL (ref 200–950)
Basophils Absolute: 37 cells/uL (ref 0–200)
Basophils Absolute: 0 10*3/uL (ref 0.0–0.1)
Basophils Relative: 0.6 %
Basophils Relative: 1 %
Eosinophils Absolute: 87 cells/uL (ref 15–500)
Eosinophils Absolute: 0.1 10*3/uL (ref 0.0–0.5)
Eosinophils Relative: 1.4 %
Eosinophils Relative: 1 %
HCT: 45.5 % (ref 38.5–50.0)
HCT: 44 % (ref 39.0–52.0)
Hemoglobin: 14.3 g/dL (ref 13.2–17.1)
Hemoglobin: 14.1 g/dL (ref 13.0–17.0)
Immature Granulocytes: 1 %
Lymphocytes Relative: 15 %
Lymphs Abs: 694 cells/uL — ABNORMAL LOW (ref 850–3900)
Lymphs Abs: 1 10*3/uL (ref 0.7–4.0)
MCH: 27.9 pg (ref 27.0–33.0)
MCH: 28.1 pg (ref 26.0–34.0)
MCHC: 31.4 g/dL — ABNORMAL LOW (ref 32.0–36.0)
MCHC: 32 g/dL (ref 30.0–36.0)
MCV: 88.7 fL (ref 80.0–100.0)
MCV: 87.6 fL (ref 80.0–100.0)
MPV: 9.4 fL (ref 7.5–12.5)
Monocytes Absolute: 0.8 10*3/uL (ref 0.1–1.0)
Monocytes Relative: 8.8 %
Monocytes Relative: 12 %
Neutro Abs: 4836 cells/uL (ref 1500–7800)
Neutro Abs: 4.6 10*3/uL (ref 1.7–7.7)
Neutrophils Relative %: 78 %
Neutrophils Relative %: 70 %
Platelets: 226 10*3/uL (ref 140–400)
Platelets: 216 10*3/uL (ref 150–400)
RBC: 5.13 10*6/uL (ref 4.20–5.80)
RBC: 5.02 MIL/uL (ref 4.22–5.81)
RDW: 13.1 % (ref 11.0–15.0)
RDW: 13.5 % (ref 11.5–15.5)
Total Lymphocyte: 11.2 %
WBC: 6.2 10*3/uL (ref 3.8–10.8)
WBC: 6.6 10*3/uL (ref 4.0–10.5)
nRBC: 0 % (ref 0.0–0.2)

## 2020-06-12 LAB — BASIC METABOLIC PANEL
BUN/Creatinine Ratio: 15 (calc) (ref 6–22)
BUN: 32 mg/dL — ABNORMAL HIGH (ref 7–25)
CO2: 19 mmol/L — ABNORMAL LOW (ref 20–32)
Calcium: 9.7 mg/dL (ref 8.6–10.3)
Chloride: 102 mmol/L (ref 98–110)
Creat: 2.18 mg/dL — ABNORMAL HIGH (ref 0.70–1.11)
Glucose, Bld: 90 mg/dL (ref 65–99)
Potassium: 4.8 mmol/L (ref 3.5–5.3)
Sodium: 134 mmol/L — ABNORMAL LOW (ref 135–146)

## 2020-06-12 LAB — URINALYSIS, ROUTINE W REFLEX MICROSCOPIC
Bilirubin Urine: NEGATIVE
Glucose, UA: NEGATIVE mg/dL
Hgb urine dipstick: NEGATIVE
Ketones, ur: 5 mg/dL — AB
Leukocytes,Ua: NEGATIVE
Nitrite: NEGATIVE
Protein, ur: NEGATIVE mg/dL
Specific Gravity, Urine: 1.013 (ref 1.005–1.030)
pH: 5 (ref 5.0–8.0)

## 2020-06-12 LAB — COMPREHENSIVE METABOLIC PANEL
ALT: 19 U/L (ref 0–44)
AST: 22 U/L (ref 15–41)
Albumin: 3.7 g/dL (ref 3.5–5.0)
Alkaline Phosphatase: 49 U/L (ref 38–126)
Anion gap: 11 (ref 5–15)
BUN: 34 mg/dL — ABNORMAL HIGH (ref 8–23)
CO2: 19 mmol/L — ABNORMAL LOW (ref 22–32)
Calcium: 9.4 mg/dL (ref 8.9–10.3)
Chloride: 105 mmol/L (ref 98–111)
Creatinine, Ser: 2.03 mg/dL — ABNORMAL HIGH (ref 0.61–1.24)
GFR calc Af Amer: 35 mL/min — ABNORMAL LOW (ref >60)
GFR calc non Af Amer: 30 mL/min — ABNORMAL LOW (ref >60)
Glucose, Bld: 103 mg/dL — ABNORMAL HIGH (ref 70–99)
Potassium: 4.7 mmol/L (ref 3.5–5.1)
Sodium: 135 mmol/L (ref 135–145)
Total Bilirubin: 0.9 mg/dL (ref 0.3–1.2)
Total Protein: 6.4 g/dL — ABNORMAL LOW (ref 6.5–8.1)

## 2020-06-12 LAB — LIPASE, BLOOD: Lipase: 49 U/L (ref 11–51)

## 2020-06-12 MED ORDER — ONDANSETRON HCL 4 MG/2ML IJ SOLN
4.0000 mg | Freq: Once | INTRAMUSCULAR | Status: AC
  Administered 2020-06-12: 4 mg via INTRAVENOUS
  Filled 2020-06-12: qty 2

## 2020-06-12 MED ORDER — SODIUM CHLORIDE 0.9 % IV BOLUS
1000.0000 mL | Freq: Once | INTRAVENOUS | Status: AC
  Administered 2020-06-12: 1000 mL via INTRAVENOUS

## 2020-06-12 NOTE — ED Triage Notes (Addendum)
Arrived POV. Patient reports vomiting and weight loss of abot 18 pounds over last 3 weeks. Patient states he was sent by PCP for further evaluation. Patient reports he cannot keep down any of his food or medications

## 2020-06-12 NOTE — Telephone Encounter (Signed)
I called pt and wife and told them I recommended Curtis Jimenez go back to the ED for eval---I believe he needs IVF rehydration and possibly admission, also needs his cough looked into further (CXR neg but likely needs CT chest). Pt and wife expressed understanding and agreement and said they will go to the ED tonight.  You can close this MyChart note now [place].

## 2020-06-12 NOTE — Telephone Encounter (Signed)
Sent to Dr.McGowen as Juluis Rainier

## 2020-06-12 NOTE — ED Provider Notes (Signed)
[place] DEPT Provider Note   CSN: [id number] Arrival date & time: 06/12/20  2035     History Chief Complaint  Patient presents with  . Emesis  . Weight Loss    Curtis Jimenez is a 84 y.o. male.  The history is provided by the patient and medical records.  Emesis Associated symptoms: cough     84 year old male with history of anemia, basal cell carcinoma, bipolar disorder, BPH, chronic renal insufficiency status post renal transplant in 2013, history of CVA, hypertension, hyperlipidemia, hypothyroidism, presenting to the ED for persistent cough, nausea, and vomiting x3 weeks.  Patient reports he has been able to tolerate very small amounts of liquids and solids since symptoms began.  States he if he tries to drink too fast or eats too much he immediately vomits.  He does report occasional sensation of food bolus in his throat, especially when taking his home medications.  He denies any difficulty swallowing his saliva.  He has not had any noted fever or chills.  Cough has been dry and nonproductive, worse when lying flat.  He has not had any real shortness of breath or chest pain.  He denies any abdominal pain.  He has had regular bowel movements.  Urination is slow, but he attributes this to decreased oral intake.  He was seen in the ED on 06/08/2020 for same, Covid testing was negative but did have evidence of AKI, felt to be from dehydration.  He states he did have some blood work with his primary care doctor yesterday, creatinine remains elevated.  PCP encouraged him to come back to the ED today, did request a CT of the chest due to persistent cough as his chest x-ray was normal.  Past Medical History:  Diagnosis Date  . Anemia   . Basal cell carcinoma    neck (skin MD 2X per year)  . Bifascicular block 2021   ectopy, asymptomatic.  Cards->observe  . Bipolar disorder ([place])   . BPH (benign prostatic hyperplasia)    with elevated PSA; followed by  Dr. Rosana Hoes at [facility]  . Chronic renal insufficiency, stage III (moderate)    in pt w/hx of renal transplant.  Post-transplant sCr 1.4-1.6.  Last renal f/u 05/01/19->Cr 1.63, GFR 39 ml/min  . Coronary atherosclerosis    LM and 3 V dz noted on CT 08/2019 performed for eval of interstitial lung dz in the setting of mild DOE and mild hypoxia. Cards->no stress tesing indicated->primary prevention emphasized  . CVA (cerebral vascular accident) ([place]) 11/2016   Pontine (vertebrobasilar--imaging neg), TPA given.  Pt discharged on plavix.  Carotid dopplers ok, echocardiogram ok.  Residual deficit: vertigo but this improved greatly with therapy.  . DOE (dyspnea on exertion) 2020   DOE and ? hypoxia->CXR 08/29/19--->diffuse interstitial opacities-? acute inflammatory process->Dr. Melvyn Novas eval'd him and was underwhelmed by the CXR and exam->CT chest 09/26/19 "Very subtle areas of mild ground-glass attenuatio-nonspecific", mild air trapping. Per Dr. Melvyn Novas, no signif ILD, improving with inc ambulation (post-inflamm pulm fibrosis?)  . Gallbladder polyp 2015   Asymptomatic  . GERD (gastroesophageal reflux disease)   . Gout   . History of adenomatous polyp of colon 2018   Recall 5 yrs  . Hyperlipidemia   . Hypertension   . Hypothyroidism   . Lumbar spondylosis   . Ptosis due to aging    Right  . Rectal bleeding 09/2017   Admitted for obs due to Hb drop.  GI consulted---obs recommended.  No  transfusion required.  Plavix was d/c'd, ferrous sulfate recommended.  Outpt GI f/u--plavix restarted and upper endoscopy was normal and colonoscopy showed 2 polyps and severe diverticular dz. Iron d/c'd 04/24/19.  Marland Kitchen Restless legs syndrome   . S/p cadaver renal transplant 2013   Secondary to HTN +lithium toxicity over 30 yrs caused kidney destruction [facility] transplant MDs)  . Seborrheic dermatitis    eyebrows worst: Hytone rx'd by Dr. Denna Haggard.  . 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 Postinflammatory pulmonary fibrosis ([place]) 08/30/2019  . DOE (dyspnea on exertion) 08/29/2019  . Overweight (BMI 25.0-29.9) 07/27/2019  . GIB (gastrointestinal bleeding) 10/07/2017  . IFG (impaired fasting glucose) 06/08/2017  . Stroke ([place]) 11/30/2016  . Essential hypertension 01/16/2015  . S/p cadaver renal transplant 11/06/2012  . Chronic kidney disease (CKD), stage IV (severe) ([place]) 03/18/2012  . BENIGN PROSTATIC HYPERTROPHY 04/18/2008  . EXTRINSIC ASTHMA, UNSPECIFIED 12/19/2007  . PSA, INCREASED 12/09/2007  . Hypothyroidism 08/12/2007  . Bipolar disorder ([place]) 07/27/2007  . Hyperlipidemia 07/07/2007  . Gout 07/07/2007  . HYPERTENSION 07/07/2007  . GERD 07/07/2007    Past Surgical History:  Procedure Laterality Date  . AV FISTULA PLACEMENT  04/08/2012   Procedure: ARTERIOVENOUS (AV) FISTULA CREATION;  Surgeon: Angelia Mould, MD;  Location: [facility] OR;  Service: Vascular;  Laterality: Left;  Creation of left brachial cephalic arteriovenous fistula  . CATARACT EXTRACTION, BILATERAL  05/2018  . COLONOSCOPY  2013; 11/24/17   2013; Normal except diverticulosis (recall 10 yrs).  2018 (for GI bleeding)--severe diverticular dz + 2 adenomatous polyps.  Recall 5 yrs.  . DG CHEST AP OR PA ([facility] HX)  06/03/2020   Bibasilar atelectasis. No focal consoliadation definitively identified.  . dilation for GERD    . ESOPHAGOGASTRODUODENOSCOPY  11/24/2017   gastric polyps x 2, otherwise normal.  . KIDNEY TRANSPLANT  11/06/12   [place] (cadaveric)  . PROSTATE BIOPSY  2011   no malignancy  . TRANSTHORACIC ECHOCARDIOGRAM  11/2016   Normal LV systolic fxn, EF 01-74%.  Grade I DD.  Mild aortic root dilatation, mild MV regurg.       Family History  Problem Relation Age of Onset  . Ulcers  Mother        GI bleed   . Heart disease Father   . Bladder Cancer Father   . Colon cancer Neg Hx   . Esophageal cancer Neg Hx   . Pancreatic cancer Neg Hx   . Rectal cancer Neg Hx   . Stomach cancer Neg Hx     Social History   Tobacco Use  . Smoking status: Former Smoker    Years: 16.00    Types: Pipe, Cigars    Quit date: 05/11/1974    Years since quitting: 46.1  . Smokeless tobacco: Never Used  Vaping Use  . Vaping Use: Never used  Substance Use Topics  . Alcohol use: No  . Drug use: No    Home Medications Prior to Admission medications   Medication Sig Start Date End Date Taking? Authorizing Provider  acetaminophen (TYLENOL) 325 MG tablet Take 650 mg by mouth every 6 (six) hours as needed for mild pain.    [provider]  allopurinol (ZYLOPRIM) 300 MG tablet Take 1 tablet by mouth once  daily 05/10/20   McGowen, Adrian Blackwater, MD  benzonatate (TESSALON) 200 MG capsule Take 1 capsule (200 mg total) by mouth 3 (three) times daily as needed for cough. 06/05/20   McGowen, Adrian Blackwater, MD  carvedilol (COREG) 12.5 MG tablet 1/2 tab po qAM and 1 tab po qPM 07/27/19   McGowen, Adrian Blackwater, MD  Cholecalciferol (VITAMIN D) 2000 UNITS CAPS Take 1 capsule by mouth daily.    [provider]  clopidogrel (PLAVIX) 75 MG tablet Take 1 tablet by mouth once daily 05/10/20   McGowen, Adrian Blackwater, MD  colchicine 0.6 MG tablet 2 tabs at onset of gout flare, then 1 tab one hour later, then starting the next day take 1 tab bid until gout flare is resolved Patient not taking: Reported on 06/05/2020 04/01/16   Tammi Sou, MD  EUTHYROX 25 MCG tablet Take 1 tablet by mouth once daily 08/07/19   McGowen, Adrian Blackwater, MD  famotidine (PEPCID) 10 MG tablet Take by mouth.    [provider]  hydrOXYzine (ATARAX/VISTARIL) 10 MG tablet Take 10 mg by mouth 2 (two) times daily as needed.     [provider]  Melatonin 2.5 MG CAPS Take 2 capsules by mouth at bedtime as needed.    [provider]  mycophenolate (MYFORTIC) 180 MG EC tablet Take 360 mg by mouth 2 (two) times daily.     [provider]  Omega-3 1000 MG CAPS Take 2,000 mg by mouth 2 (two) times daily.  12/06/12   [provider]  omeprazole (PRILOSEC) 20 MG capsule Take 1 capsule by mouth once daily 09/27/19   McGowen, Adrian Blackwater, MD  ondansetron (ZOFRAN ODT) 4 MG disintegrating tablet Take 1 tablet (4 mg total) by mouth every 8 (eight) hours as needed for nausea or vomiting. 06/08/20   Tedd Sias, PA  risperiDONE (RISPERDAL) 1 MG tablet Take 1 mg by mouth at bedtime. 1mg  at bedtime    [provider]  simvastatin (ZOCOR) 20 MG tablet Take 1 tablet by mouth once daily 01/24/20   McGowen, Adrian Blackwater, MD  sulfamethoxazole-trimethoprim (BACTRIM DS) 800-160 MG tablet 2 tabs po tid x 21 days 06/05/20   Tammi Sou, MD  sulfamethoxazole-trimethoprim (BACTRIM,SEPTRA) 400-80 MG tablet Take 1 tablet by mouth every Monday, Wednesday, and Friday.    [provider]  tacrolimus (PROGRAF) 0.5 MG capsule 2 in the am and 1 at night    [provider]  vitamin B-12 (CYANOCOBALAMIN) 100 MCG tablet Take 100 mcg by mouth daily.    [provider]    Allergies    Amantadine hcl and Chlorpromazine hcl  Review of Systems   Review of Systems  Constitutional: Positive for unexpected weight change.  Respiratory: Positive for cough.   Gastrointestinal: Positive for nausea and vomiting.  All other systems reviewed and are negative.   Physical Exam Updated Vital Signs BP 133/79   Pulse 76   Temp 97.6 F (36.4 C)   Resp 18   SpO2 94%   Physical Exam Vitals and nursing note reviewed.  Constitutional:      Appearance: He is well-developed.     Comments: Elderly, appears weak  HENT:     Head: Normocephalic and atraumatic.  Eyes:     Conjunctiva/sclera: Conjunctivae normal.     Pupils: Pupils are equal, round, and reactive to light.  Cardiovascular:     Rate and  Rhythm: Normal rate and regular rhythm.     Heart  sounds: Normal heart sounds.  Pulmonary:     Effort: Pulmonary effort is normal. No respiratory distress.     Breath sounds: Normal breath sounds. No rhonchi.  Abdominal:     General: Bowel sounds are normal.     Palpations: Abdomen is soft.     Tenderness: There is no abdominal tenderness. There is no rebound.     Comments: Soft, nontender, normal bowel sounds  Musculoskeletal:        General: Normal range of motion.     Cervical back: Normal range of motion.  Skin:    General: Skin is warm and dry.  Neurological:     Mental Status: He is alert and oriented to person, place, and time.     ED Results / Procedures / Treatments   Labs (all labs ordered are listed, but only abnormal results are displayed) Labs Reviewed  COMPREHENSIVE METABOLIC PANEL - Abnormal; Notable for the following components:      Result Value   CO2 19 (*)    Glucose, Bld 103 (*)    BUN 34 (*)    Creatinine, Ser 2.03 (*)    Total Protein 6.4 (*)    GFR calc non Af Amer 30 (*)    GFR calc Af Amer 35 (*)    All other components within normal limits  URINALYSIS, ROUTINE W REFLEX MICROSCOPIC - Abnormal; Notable for the following components:   APPearance HAZY (*)    Ketones, ur 5 (*)    All other components within normal limits  SARS CORONAVIRUS 2 BY RT PCR (HOSPITAL ORDER, [facility])  CBC WITH DIFFERENTIAL/PLATELET  LIPASE, BLOOD    EKG None  Radiology CT ABDOMEN PELVIS WO CONTRAST  Result Date: 06/13/2020 CLINICAL DATA:  Nausea vomiting weight loss over the last 3 weeks EXAM: CT CHEST WITHOUT CONTRAST TECHNIQUE: Multidetector CT imaging of the chest was performed following the standard protocol without IV contrast. COMPARISON:  September 26, 2019 FINDINGS: Cardiovascular: Coronary artery calcifications are seen. There is scattered aortic atherosclerosis noted. The heart size is normal. There is no pericardial thickening or  effusion. Mediastinum/Nodes: No definite enlarged mediastinal adenopathy, however limited due to the lack of intravenous contrast. The thyroid gland, trachea and esophagus demonstrate no significant findings. Lungs/Pleura: Again noted is minimal ground-glass opacity at the posterior right lung base, likely atelectasis or scarring. No suspicious pulmonary nodules. There is no pleural effusion. Upper abdomen: The visualized portion of the upper abdomen is unremarkable. Musculoskeletal/Chest wall: There is no chest wall mass or suspicious osseous finding. No acute osseous abnormality Abdomen/pelvis: Hepatobiliary: Although limited due to the lack of intravenous contrast, normal in appearance without gross focal abnormality. No evidence of calcified gallstones or biliary ductal dilatation. Pancreas:  Unremarkable.  No surrounding inflammatory changes. Spleen: Normal in size. Although limited due to the lack of intravenous contrast, normal in appearance. Adrenals/Urinary Tract: Both adrenal glands appear normal. There is renal atrophy of the bilateral native kidneys. Within the upper pole of the right kidney there is a slightly hyperdense lesion measuring 3.7 cm. Throughout both kidneys are low-density lesions. A right lower pelvic renal transplant is present. There is a 3 cm low-density lesion within the renal transplant, likely renal cyst. No hydronephrosis is seen. The bladder is unremarkable. Stomach/Bowel: The stomach and small bowel are normal in appearance. There is scattered colonic diverticula without diverticulitis. A moderate to large amount of colonic stool is present. Vascular/Lymphatic: There are no enlarged abdominal or pelvic lymph nodes.  Scattered aortic atherosclerotic calcifications are seen without aneurysmal dilatation. Reproductive: A heterogeneously enlarged prostate gland is noted which protrudes into the posterior bladder. Other: No evidence of abdominal wall mass or hernia. Musculoskeletal: No  acute or significant osseous findings. Degenerative changes are seen throughout the thoracolumbar spine. Anterior flowing osteophytes are seen in the lower thoracic spine. IMPRESSION: 1. No acute intrathoracic pathology to explain the patient's symptoms. 2. Coronary artery calcifications. 3. Slightly hyperdense 3.7 cm lesion within the right native kidney which could represent a proteinaceous or hemorrhagic cyst, however cannot exclude a solid renal lesion. Would recommend renal ultrasound for further evaluation. 4. Diverticulosis without diverticulitis. 5.  Aortic Atherosclerosis (ICD10-I70.0). 6. Prostatomegaly Electronically Signed   By: Prudencio Pair M.D.   On: 06/13/2020 00:22   CT Chest Wo Contrast  Result Date: 06/13/2020 CLINICAL DATA:  Nausea vomiting weight loss over the last 3 weeks EXAM: CT CHEST WITHOUT CONTRAST TECHNIQUE: Multidetector CT imaging of the chest was performed following the standard protocol without IV contrast. COMPARISON:  September 26, 2019 FINDINGS: Cardiovascular: Coronary artery calcifications are seen. There is scattered aortic atherosclerosis noted. The heart size is normal. There is no pericardial thickening or effusion. Mediastinum/Nodes: No definite enlarged mediastinal adenopathy, however limited due to the lack of intravenous contrast. The thyroid gland, trachea and esophagus demonstrate no significant findings. Lungs/Pleura: Again noted is minimal ground-glass opacity at the posterior right lung base, likely atelectasis or scarring. No suspicious pulmonary nodules. There is no pleural effusion. Upper abdomen: The visualized portion of the upper abdomen is unremarkable. Musculoskeletal/Chest wall: There is no chest wall mass or suspicious osseous finding. No acute osseous abnormality Abdomen/pelvis: Hepatobiliary: Although limited due to the lack of intravenous contrast, normal in appearance without gross focal abnormality. No evidence of calcified gallstones or biliary  ductal dilatation. Pancreas:  Unremarkable.  No surrounding inflammatory changes. Spleen: Normal in size. Although limited due to the lack of intravenous contrast, normal in appearance. Adrenals/Urinary Tract: Both adrenal glands appear normal. There is renal atrophy of the bilateral native kidneys. Within the upper pole of the right kidney there is a slightly hyperdense lesion measuring 3.7 cm. Throughout both kidneys are low-density lesions. A right lower pelvic renal transplant is present. There is a 3 cm low-density lesion within the renal transplant, likely renal cyst. No hydronephrosis is seen. The bladder is unremarkable. Stomach/Bowel: The stomach and small bowel are normal in appearance. There is scattered colonic diverticula without diverticulitis. A moderate to large amount of colonic stool is present. Vascular/Lymphatic: There are no enlarged abdominal or pelvic lymph nodes. Scattered aortic atherosclerotic calcifications are seen without aneurysmal dilatation. Reproductive: A heterogeneously enlarged prostate gland is noted which protrudes into the posterior bladder. Other: No evidence of abdominal wall mass or hernia. Musculoskeletal: No acute or significant osseous findings. Degenerative changes are seen throughout the thoracolumbar spine. Anterior flowing osteophytes are seen in the lower thoracic spine. IMPRESSION: 1. No acute intrathoracic pathology to explain the patient's symptoms. 2. Coronary artery calcifications. 3. Slightly hyperdense 3.7 cm lesion within the right native kidney which could represent a proteinaceous or hemorrhagic cyst, however cannot exclude a solid renal lesion. Would recommend renal ultrasound for further evaluation. 4. Diverticulosis without diverticulitis. 5.  Aortic Atherosclerosis (ICD10-I70.0). 6. Prostatomegaly Electronically Signed   By: Prudencio Pair M.D.   On: 06/13/2020 00:22    Procedures Procedures (including critical care time)  Medications Ordered in  ED Medications  sodium chloride 0.9 % bolus 1,000 mL (1,000 mLs Intravenous New Bag/Given 06/12/20  2305)  ondansetron (ZOFRAN) injection 4 mg (4 mg Intravenous Given 06/12/20 2306)    ED Course  I have reviewed the triage vital signs and the nursing notes.  Pertinent labs & imaging results that were available during my care of the patient were reviewed by me and considered in my medical decision making (see chart for details).    MDM Rules/Calculators/A&P  84 year old male presenting to the ED with nausea and vomiting.  This is been a persistent problem over the past 3 weeks.  Wife states has actually worsened over the past week.  He was seen in the ED on 06/08/20 for same, given zofran without much improvement.  Wife reports only eating/drinking about 2oz food/fluids at a time.  He has had some difficulty swallowing solids so they have tried to modify diet to some thickened liquids, oatmeal, cottage cheese, and added Ensure after talking with her daughter who is a Firefighter.  Patient is afebrile and nontoxic in appearance here.  He does have a dry cough on exam but is in no acute respiratory distress.  His abdomen is soft and benign with normal bowel sounds.  No apparent distention.  Will repeat labs.  Based on chart review, it appears PCP wanted CT of the chest given persistent cough and negative chest x-rays and negative Covid screens.  This will be ordered along with CT of the abdomen.  Labs with continued elevated creatinine, although slightly improved from prior.  BUN remains elevated as well.  Continue to suspect that this is likely from degree of dehydration and limited oral intake.  CT of the chest without any acute findings.  CT abdomen without any obstructive findings, there is finding of hyperdense lesion in right native kidney, question hemorrhagic cyst versus solid renal lesion.  Will add on ultrasound.  Patient has not had any vomiting here in the ED, however he has also not  try to eat or drink anything here.  I do have concerns about patient's ongoing symptoms and his lack of response to OP treatment, especially with mention of food and pills getting caught in his esophagus.  He has never had issues like this before.  He did have an EGD in 2018 with some polyps but no other acute findings.  Question if he may benefit from GI consultation in the morning and evaluation for possible repeat EGD.  Patient and family are agreeable to admission for continued hydration.  Discussed with hospitalist, Dr. Marlowe Sax-- will admit for ongoing care.  Final Clinical Impression(s) / ED Diagnoses Final diagnoses:  Non-intractable vomiting with nausea, unspecified vomiting type  Weight loss  AKI (acute kidney injury) [facility])    Rx / DC Orders ED Discharge Orders    None       Larene Pickett, PA-C 06/13/20 0230    Veryl Speak, MD 06/13/20 [phone]

## 2020-06-13 ENCOUNTER — Observation Stay (HOSPITAL_COMMUNITY): Payer: Medicare HMO

## 2020-06-13 DIAGNOSIS — R05 Cough: Secondary | ICD-10-CM | POA: Diagnosis not present

## 2020-06-13 DIAGNOSIS — R1310 Dysphagia, unspecified: Secondary | ICD-10-CM

## 2020-06-13 DIAGNOSIS — R1110 Vomiting, unspecified: Secondary | ICD-10-CM | POA: Diagnosis present

## 2020-06-13 DIAGNOSIS — T43595A Adverse effect of other antipsychotics and neuroleptics, initial encounter: Secondary | ICD-10-CM | POA: Diagnosis present

## 2020-06-13 DIAGNOSIS — N400 Enlarged prostate without lower urinary tract symptoms: Secondary | ICD-10-CM | POA: Diagnosis present

## 2020-06-13 DIAGNOSIS — E872 Acidosis, unspecified: Secondary | ICD-10-CM

## 2020-06-13 DIAGNOSIS — R634 Abnormal weight loss: Secondary | ICD-10-CM | POA: Diagnosis not present

## 2020-06-13 DIAGNOSIS — K5730 Diverticulosis of large intestine without perforation or abscess without bleeding: Secondary | ICD-10-CM | POA: Diagnosis not present

## 2020-06-13 DIAGNOSIS — K317 Polyp of stomach and duodenum: Secondary | ICD-10-CM | POA: Diagnosis not present

## 2020-06-13 DIAGNOSIS — N179 Acute kidney failure, unspecified: Secondary | ICD-10-CM | POA: Diagnosis not present

## 2020-06-13 DIAGNOSIS — K219 Gastro-esophageal reflux disease without esophagitis: Secondary | ICD-10-CM

## 2020-06-13 DIAGNOSIS — R918 Other nonspecific abnormal finding of lung field: Secondary | ICD-10-CM | POA: Diagnosis not present

## 2020-06-13 DIAGNOSIS — I452 Bifascicular block: Secondary | ICD-10-CM | POA: Diagnosis not present

## 2020-06-13 DIAGNOSIS — N281 Cyst of kidney, acquired: Secondary | ICD-10-CM | POA: Diagnosis not present

## 2020-06-13 DIAGNOSIS — Z8673 Personal history of transient ischemic attack (TIA), and cerebral infarction without residual deficits: Secondary | ICD-10-CM | POA: Diagnosis not present

## 2020-06-13 DIAGNOSIS — Z85828 Personal history of other malignant neoplasm of skin: Secondary | ICD-10-CM | POA: Diagnosis not present

## 2020-06-13 DIAGNOSIS — I2510 Atherosclerotic heart disease of native coronary artery without angina pectoris: Secondary | ICD-10-CM | POA: Diagnosis present

## 2020-06-13 DIAGNOSIS — K222 Esophageal obstruction: Secondary | ICD-10-CM | POA: Diagnosis not present

## 2020-06-13 DIAGNOSIS — E039 Hypothyroidism, unspecified: Secondary | ICD-10-CM | POA: Diagnosis present

## 2020-06-13 DIAGNOSIS — R112 Nausea with vomiting, unspecified: Secondary | ICD-10-CM | POA: Diagnosis not present

## 2020-06-13 DIAGNOSIS — R059 Cough, unspecified: Secondary | ICD-10-CM

## 2020-06-13 DIAGNOSIS — Q402 Other specified congenital malformations of stomach: Secondary | ICD-10-CM | POA: Diagnosis not present

## 2020-06-13 DIAGNOSIS — T8619 Other complication of kidney transplant: Secondary | ICD-10-CM | POA: Diagnosis not present

## 2020-06-13 DIAGNOSIS — Z20822 Contact with and (suspected) exposure to covid-19: Secondary | ICD-10-CM | POA: Diagnosis not present

## 2020-06-13 DIAGNOSIS — Y830 Surgical operation with transplant of whole organ as the cause of abnormal reaction of the patient, or of later complication, without mention of misadventure at the time of the procedure: Secondary | ICD-10-CM | POA: Diagnosis not present

## 2020-06-13 DIAGNOSIS — E860 Dehydration: Secondary | ICD-10-CM | POA: Diagnosis present

## 2020-06-13 DIAGNOSIS — I129 Hypertensive chronic kidney disease with stage 1 through stage 4 chronic kidney disease, or unspecified chronic kidney disease: Secondary | ICD-10-CM | POA: Diagnosis not present

## 2020-06-13 DIAGNOSIS — F319 Bipolar disorder, unspecified: Secondary | ICD-10-CM | POA: Diagnosis present

## 2020-06-13 DIAGNOSIS — E785 Hyperlipidemia, unspecified: Secondary | ICD-10-CM | POA: Diagnosis not present

## 2020-06-13 DIAGNOSIS — K2100 Gastro-esophageal reflux disease with esophagitis, without bleeding: Secondary | ICD-10-CM | POA: Diagnosis not present

## 2020-06-13 DIAGNOSIS — R1319 Other dysphagia: Secondary | ICD-10-CM | POA: Insufficient documentation

## 2020-06-13 DIAGNOSIS — Z940 Kidney transplant status: Secondary | ICD-10-CM | POA: Diagnosis not present

## 2020-06-13 LAB — BASIC METABOLIC PANEL
Anion gap: 10 (ref 5–15)
BUN: 33 mg/dL — ABNORMAL HIGH (ref 8–23)
CO2: 20 mmol/L — ABNORMAL LOW (ref 22–32)
Calcium: 8.7 mg/dL — ABNORMAL LOW (ref 8.9–10.3)
Chloride: 103 mmol/L (ref 98–111)
Creatinine, Ser: 1.81 mg/dL — ABNORMAL HIGH (ref 0.61–1.24)
GFR calc Af Amer: 40 mL/min — ABNORMAL LOW (ref >60)
GFR calc non Af Amer: 34 mL/min — ABNORMAL LOW (ref >60)
Glucose, Bld: 80 mg/dL (ref 70–99)
Potassium: 4.1 mmol/L (ref 3.5–5.1)
Sodium: 133 mmol/L — ABNORMAL LOW (ref 135–145)

## 2020-06-13 LAB — SARS CORONAVIRUS 2 BY RT PCR (HOSPITAL ORDER, PERFORMED IN ~~LOC~~ HOSPITAL LAB): SARS Coronavirus 2: NEGATIVE

## 2020-06-13 MED ORDER — ALLOPURINOL 300 MG PO TABS
300.0000 mg | ORAL_TABLET | Freq: Every day | ORAL | Status: DC
  Administered 2020-06-13: 300 mg via ORAL
  Filled 2020-06-13 (×2): qty 1

## 2020-06-13 MED ORDER — TACROLIMUS 0.5 MG PO CAPS
0.5000 mg | ORAL_CAPSULE | Freq: Every day | ORAL | Status: DC
  Administered 2020-06-13: 0.5 mg via ORAL
  Filled 2020-06-13 (×3): qty 1

## 2020-06-13 MED ORDER — CLOPIDOGREL BISULFATE 75 MG PO TABS
75.0000 mg | ORAL_TABLET | Freq: Every day | ORAL | Status: DC
  Filled 2020-06-13 (×2): qty 1

## 2020-06-13 MED ORDER — LEVOTHYROXINE SODIUM 25 MCG PO TABS
25.0000 ug | ORAL_TABLET | Freq: Every day | ORAL | Status: DC
  Administered 2020-06-13 – 2020-06-14 (×2): 25 ug via ORAL
  Filled 2020-06-13 (×2): qty 1

## 2020-06-13 MED ORDER — CARVEDILOL 6.25 MG PO TABS
6.2500 mg | ORAL_TABLET | Freq: Two times a day (BID) | ORAL | Status: DC
  Administered 2020-06-13 (×2): 6.25 mg via ORAL
  Filled 2020-06-13: qty 1
  Filled 2020-06-13: qty 2

## 2020-06-13 MED ORDER — ACETAMINOPHEN 325 MG PO TABS
650.0000 mg | ORAL_TABLET | Freq: Four times a day (QID) | ORAL | Status: DC | PRN
  Administered 2020-06-13: 650 mg via ORAL
  Filled 2020-06-13: qty 2

## 2020-06-13 MED ORDER — SODIUM CHLORIDE 0.9 % IV SOLN: INTRAVENOUS | Status: AC

## 2020-06-13 MED ORDER — BENZONATATE 100 MG PO CAPS
100.0000 mg | ORAL_CAPSULE | Freq: Three times a day (TID) | ORAL | Status: DC | PRN
  Administered 2020-06-13: 100 mg via ORAL
  Filled 2020-06-13: qty 1

## 2020-06-13 MED ORDER — HEPARIN SODIUM (PORCINE) 5000 UNIT/ML IJ SOLN
5000.0000 [IU] | Freq: Three times a day (TID) | INTRAMUSCULAR | Status: DC
  Administered 2020-06-13 – 2020-06-14 (×4): 5000 [IU] via SUBCUTANEOUS
  Filled 2020-06-13 (×4): qty 1

## 2020-06-13 MED ORDER — ONDANSETRON HCL 4 MG/2ML IJ SOLN: 4.0000 mg | Freq: Four times a day (QID) | INTRAMUSCULAR | Status: DC | PRN

## 2020-06-13 MED ORDER — PANTOPRAZOLE SODIUM 40 MG IV SOLR
40.0000 mg | Freq: Every day | INTRAVENOUS | Status: DC
  Administered 2020-06-13: 40 mg via INTRAVENOUS
  Filled 2020-06-13: qty 40

## 2020-06-13 MED ORDER — ACETAMINOPHEN 650 MG RE SUPP: 650.0000 mg | Freq: Four times a day (QID) | RECTAL | Status: DC | PRN

## 2020-06-13 MED ORDER — MYCOPHENOLATE SODIUM 180 MG PO TBEC
360.0000 mg | DELAYED_RELEASE_TABLET | Freq: Two times a day (BID) | ORAL | Status: DC
  Administered 2020-06-13 – 2020-06-14 (×3): 360 mg via ORAL
  Filled 2020-06-13 (×6): qty 2

## 2020-06-13 MED ORDER — TACROLIMUS 1 MG PO CAPS
1.0000 mg | ORAL_CAPSULE | Freq: Every day | ORAL | Status: DC
  Administered 2020-06-13 – 2020-06-14 (×2): 1 mg via ORAL
  Filled 2020-06-13 (×3): qty 1

## 2020-06-13 MED ORDER — SIMVASTATIN 20 MG PO TABS
20.0000 mg | ORAL_TABLET | Freq: Every day | ORAL | Status: DC
  Administered 2020-06-13: 20 mg via ORAL
  Filled 2020-06-13: qty 1

## 2020-06-13 MED ORDER — RISPERIDONE 1 MG PO TABS
1.0000 mg | ORAL_TABLET | Freq: Every day | ORAL | Status: DC
  Administered 2020-06-13: 1 mg via ORAL
  Filled 2020-06-13: qty 1

## 2020-06-13 NOTE — Consult Note (Signed)
Referring Provider: Dr. Raiford Noble Primary Care Physician:  Tammi Sou, MD Primary Gastroenterologist:  Dr. Silverio Decamp   Reason for Consultation:  Dysphagia and weight loss   HPI: Curtis Jimenez is a 84 y.o. Jimenez the past medical history of basal cell carcinoma, bipolar disorder, BPH, CKD status post renal transplant 2013, hypertension, hyperlipidemia, hypothyroidism, CVA on Plavix, IDA, GI bleed (most likely) diverticular in 09/2017. He underwent an EGD and colonoscopy 11/24/2017 by Dr. Silverio Decamp.  The EGD revealed a few gastric polyps otherwise was normal.  The colonoscopy identified diverticulosis throughout the entire colon with narrowing and evidence of diverticular spasm and peridiverticular erythema, 2 polyps and nonbleeding internal hemorrhoids were noted.  His anemia stabilized on Ferrous Sulfate.   He presented to [facility] on 06/08/2020 with complaints of having nausea, sore throat, cough and loss of taste and smell for 2-1/2 weeks.  Labs in the ED showed a WBC 7.2.  Hemoglobin 14.4. Covid 19 test is negative.  Chest x-ray was negative.  He was discharged home on Ondansetron PRN.  His nausea and vomiting did not improve.  He presented back to [facility] long hospital emergency room 06/12/2020. He reported having nausea, vomiting and dysphagia for the past 3 weeks. He initially developed difficulty swallowing his medications then with solid foods. He described having pills or food get stuck in his throat/upper esophagus a few times most days. The stuck pills or food would sometimes pass if he drank more water, other times he coughed, gagged and the stuck matter was expelled. He vomits shortly after eating solid food. Sometimes he vomits after gagging when food gets stuck in his esophagus but he also describes having emesis consisting of partially undigested food an secretions when he is not having an episode of dysphagia. No Hematemesis. He continues to cough. No hemoptysis. He  takes Bactrim one tab three times weekly for prophylaxis secondary to his renal transplant. He is immunosupressed on Prograf and Myfortic. Due to his recent cough, his PCP increased his Bactrim to 2 tabs tid on Thurs 06/06/2020. Since then, his dysphagia symptoms have worsened. He stopped taking the Bactrim 2 days ago. He take Omeprazole QOD alternating with Famotidine QOD which keeps his reflux well controlled. He has infrequent heartburn. He typically has a normal BM every few days. However, over the past 3 weeks he is passing a normal brown formed stool daily without difficulty. No rectal bleeding or melena. He has lost 17lbs over the past 3 weeks. No fever, sweats or chills.   Labs in the ED showed sodium 135.  Potassium 4.7.  BUN 34.  Creatinine 2.03.  Lipase 49.  AST 22.  ALT 19.  Total bili 0.9.  WBC 6.6.  Hemoglobin 14.1.  Platelet 216. A chest CT did not show any evidence of acute pathology.  Abdominal/pelvic CT showed a slightly hyperdense 3.7 cm lesion within the right native kidney.  A renal sonogram was done which showed a nonvascular cyst.   ED course colon: Sodium 134.  Potassium 4.8.  Glucose 90.  BUN 32.  Creatinine 2.18.  Calcium 9.7.  WBC 6.2.  Hemoglobin 14.3.  Hematocrit 45.5.  MCV 88.7.  Platelet 226.  Chest CT/Abd/Pelvic without contrast 06/12/2020: 1. No acute intrathoracic pathology to explain the patient's symptoms. 2. Coronary artery calcifications. 3. Slightly hyperdense 3.7 cm lesion within the right native kidney which could represent a proteinaceous or hemorrhagic cyst, however cannot exclude a solid renal lesion. Would recommend renal ultrasound for further evaluation.  4. Diverticulosis without diverticulitis. 5.  Aortic Atherosclerosis (ICD10-I70.0). 6. Prostatomegaly  Renal ultrasound 06/13/2020: 1. Native kidney medical renal disease, atrophy with benign appearing polycystic disease. The right upper pole lesion in question on the CT appears to be a nonvascular  cyst. 2. Right lower quadrant renal transplant with benign cyst and no hydronephrosis  EGD 11/24/2017: - Normal esophagus. - A few gastric polyps, benign appearing fundic gland polyps. - Normal examined duodenum. - No specimens collected.  Colonoscopy 11/24/2017: Severe diverticulosis in the entire examined colon. There was narrowing of the colon in association with the diverticular opening. There was evidence of diverticular spasm. Peridiverticular erythema was seen. There was evidence of an impacted diverticulum. There was no evidence of diverticular bleeding. - One 3 mm polyp in the ascending colon, removed with a cold biopsy forceps. Resected and retrieved. - One 7 mm polyp in the ascending colon, removed with a cold snare. Resected and retrieved. - Non-bleeding internal hemorrhoids. - The examination was otherwise normal.  Colonoscopy 07/18/2012: No polyps Severe diverticulosis to the sigmoid and descending colon   Past Medical History:  Diagnosis Date  . Anemia   . Basal cell carcinoma    neck (skin MD 2X per year)  . Bifascicular block 2021   ectopy, asymptomatic.  Cards->observe  . Bipolar disorder ([place])   . BPH (benign prostatic hyperplasia)    with elevated PSA; followed by Dr. Rosana Hoes at [facility]  . Chronic renal insufficiency, stage III (moderate)    in pt w/hx of renal transplant.  Post-transplant sCr 1.4-1.6.  Last renal f/u 05/01/19->Cr 1.63, GFR 39 ml/min  . Coronary atherosclerosis    LM and 3 V dz noted on CT 08/2019 performed for eval of interstitial lung dz in the setting of mild DOE and mild hypoxia. Cards->no stress tesing indicated->primary prevention emphasized  . CVA (cerebral vascular accident) ([place]) 11/2016   Pontine (vertebrobasilar--imaging neg), TPA given.  Pt discharged on plavix.  Carotid dopplers ok, echocardiogram ok.  Residual deficit: vertigo but this improved greatly with therapy.  . DOE (dyspnea on exertion) 2020   DOE and ?  hypoxia->CXR 08/29/19--->diffuse interstitial opacities-? acute inflammatory process->Dr. Melvyn Novas eval'd him and was underwhelmed by the CXR and exam->CT chest 09/26/19 "Very subtle areas of mild ground-glass attenuatio-nonspecific", mild air trapping. Per Dr. Melvyn Novas, no signif ILD, improving with inc ambulation (post-inflamm pulm fibrosis?)  . Gallbladder polyp 2015   Asymptomatic  . GERD (gastroesophageal reflux disease)   . Gout   . History of adenomatous polyp of colon 2018   Recall 5 yrs  . Hyperlipidemia   . Hypertension   . Hypothyroidism   . Lumbar spondylosis   . Ptosis due to aging    Right  . Rectal bleeding 09/2017   Admitted for obs due to Hb drop.  GI consulted---obs recommended.  No transfusion required.  Plavix was d/c'd, ferrous sulfate recommended.  Outpt GI f/u--plavix restarted and upper endoscopy was normal and colonoscopy showed 2 polyps and severe diverticular dz. Iron d/c'd 04/24/19.  Marland Kitchen Restless legs syndrome   . S/p cadaver renal transplant 2013   Secondary to HTN +lithium toxicity over 30 yrs caused kidney destruction [facility] transplant MDs)  . Seborrheic dermatitis    eyebrows worst: Hytone rx'd by Dr. Denna Haggard.  . 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 AV FISTULA PLACEMENT  04/08/2012   Procedure: ARTERIOVENOUS (AV) FISTULA CREATION;  Surgeon: Angelia Mould, MD;  Location: [facility] OR;  Service: Vascular;  Laterality: Left;  Creation of left brachial cephalic arteriovenous fistula  . CATARACT EXTRACTION, BILATERAL  05/2018  . COLONOSCOPY  2013; 11/24/17   2013; Normal except diverticulosis (recall 10 yrs).  2018 (for GI bleeding)--severe diverticular dz + 2 adenomatous polyps.  Recall 5 yrs.  . DG CHEST AP OR PA ([facility] HX)  06/03/2020    Bibasilar atelectasis. No focal consoliadation definitively identified.  . dilation for GERD    . ESOPHAGOGASTRODUODENOSCOPY  11/24/2017   gastric polyps x 2, otherwise normal.  . KIDNEY TRANSPLANT  11/06/12   [place] (cadaveric)  . PROSTATE BIOPSY  2011   no malignancy  . TRANSTHORACIC ECHOCARDIOGRAM  11/2016   Normal LV systolic fxn, EF 44-31%.  Grade I DD.  Mild aortic root dilatation, mild MV regurg.    Prior to Admission medications   Medication Sig Start Date End Date Taking? Authorizing Provider  acetaminophen (TYLENOL) 325 MG tablet Take 650 mg by mouth every 6 (six) hours as needed for mild pain.   Yes [provider]  allopurinol (ZYLOPRIM) 300 MG tablet Take 1 tablet by mouth once daily 05/10/20  Yes McGowen, Adrian Blackwater, MD  benzonatate (TESSALON) 200 MG capsule Take 1 capsule (200 mg total) by mouth 3 (three) times daily as needed for cough. 06/05/20  Yes McGowen, Adrian Blackwater, MD  carvedilol (COREG) 12.5 MG tablet 1/2 tab po qAM and 1 tab po qPM Patient taking differently: Take by mouth See admin instructions. 1/2 tab po qAM and 1/2 tab po qPM 07/27/19  Yes McGowen, Adrian Blackwater, MD  clopidogrel (PLAVIX) 75 MG tablet Take 1 tablet by mouth once daily 05/10/20  Yes McGowen, Adrian Blackwater, MD  EUTHYROX 25 MCG tablet Take 1 tablet by mouth once daily 08/07/19  Yes McGowen, Adrian Blackwater, MD  hydrOXYzine (ATARAX/VISTARIL) 10 MG tablet Take 10 mg by mouth 2 (two) times daily as needed for itching or anxiety.    Yes [provider]  Melatonin 2.5 MG CAPS Take 2 capsules by mouth at bedtime as needed (sleep).    Yes [provider]  mycophenolate (MYFORTIC) 180 MG EC tablet Take 360 mg by mouth 2 (two) times daily.    Yes [provider]  omeprazole (PRILOSEC) 20 MG capsule Take 1 capsule by mouth once daily 09/27/19  Yes McGowen, Adrian Blackwater, MD  ondansetron (ZOFRAN ODT) 4 MG disintegrating tablet Take 1 tablet (4 mg total) by mouth every 8 (eight) hours as needed for nausea or  vomiting. 06/08/20  Yes Fondaw, Wylder S, PA  risperiDONE (RISPERDAL) 1 MG tablet Take 1 mg by mouth at bedtime.    Yes [provider]  simvastatin (ZOCOR) 20 MG tablet Take 1 tablet by mouth once daily 01/24/20  Yes McGowen, Adrian Blackwater, MD  sulfamethoxazole-trimethoprim (BACTRIM DS) 800-160 MG tablet 2 tabs po tid x 21 days Patient taking differently: Take 1 tablet by mouth daily.  06/05/20  Yes McGowen, Adrian Blackwater, MD  sulfamethoxazole-trimethoprim (BACTRIM,SEPTRA) 400-80 MG tablet Take 1 tablet by mouth every Monday, Wednesday, and Friday.   Yes [provider]  tacrolimus (PROGRAF) 0.5 MG capsule See admin instructions. 2 in the am and 1 at night    Yes [provider]  colchicine 0.6 MG tablet 2 tabs at onset of gout flare, then 1 tab one  hour later, then starting the next day take 1 tab bid until gout flare is resolved Patient not taking: Reported on 06/05/2020 04/01/16   McGowen, Adrian Blackwater, MD    Current Facility-Administered Medications  Medication Dose Route Frequency Provider Last Rate Last Admin  . 0.9 %  sodium chloride infusion   Intravenous Continuous Shela Leff, MD 125 mL/hr at 06/13/20 0619 New Bag at 06/13/20 2229  . acetaminophen (TYLENOL) tablet 650 mg  650 mg Oral Q6H PRN Shela Leff, MD   650 mg at 06/13/20 7989   Or  . acetaminophen (TYLENOL) suppository 650 mg  650 mg Rectal Q6H PRN Shela Leff, MD      . allopurinol (ZYLOPRIM) tablet 300 mg  300 mg Oral Daily Shela Leff, MD      . benzonatate (TESSALON) capsule 100 mg  100 mg Oral TID PRN Shela Leff, MD   100 mg at 06/13/20 2119  . carvedilol (COREG) tablet 6.25 mg  6.25 mg Oral BID WC Shela Leff, MD   6.25 mg at 06/13/20 0832  . clopidogrel (PLAVIX) tablet 75 mg  75 mg Oral Daily Shela Leff, MD      . heparin injection 5,000 Units  5,000 Units Subcutaneous Q8H Shela Leff, MD   5,000 Units at 06/13/20 0618  . levothyroxine (SYNTHROID) tablet 25  mcg  25 mcg Oral Q0600 Shela Leff, MD   25 mcg at 06/13/20 0614  . mycophenolate (MYFORTIC) EC tablet 360 mg  360 mg Oral BID Shela Leff, MD      . ondansetron [facility]) injection 4 mg  4 mg Intravenous Q6H PRN Shela Leff, MD      . pantoprazole (PROTONIX) injection 40 mg  40 mg Intravenous Daily Shela Leff, MD   40 mg at 06/13/20 4174  . risperiDONE (RISPERDAL) tablet 1 mg  1 mg Oral QHS Shela Leff, MD      . simvastatin (ZOCOR) tablet 20 mg  20 mg Oral Daily Shela Leff, MD      . tacrolimus (PROGRAF) capsule 0.5 mg  0.5 mg Oral QHS Shela Leff, MD      . tacrolimus (PROGRAF) capsule 1 mg  1 mg Oral Daily Shela Leff, MD       Current Outpatient Medications  Medication Sig Dispense Refill  . acetaminophen (TYLENOL) 325 MG tablet Take 650 mg by mouth every 6 (six) hours as needed for mild pain.    Marland Kitchen allopurinol (ZYLOPRIM) 300 MG tablet Take 1 tablet by mouth once daily 90 tablet 0  . benzonatate (TESSALON) 200 MG capsule Take 1 capsule (200 mg total) by mouth 3 (three) times daily as needed for cough. 30 capsule 2  . carvedilol (COREG) 12.5 MG tablet 1/2 tab po qAM and 1 tab po qPM (Patient taking differently: Take by mouth See admin instructions. 1/2 tab po qAM and 1/2 tab po qPM) 135 tablet 3  . clopidogrel (PLAVIX) 75 MG tablet Take 1 tablet by mouth once daily 90 tablet 0  . EUTHYROX 25 MCG tablet Take 1 tablet by mouth once daily 90 tablet 3  . hydrOXYzine (ATARAX/VISTARIL) 10 MG tablet Take 10 mg by mouth 2 (two) times daily as needed for itching or anxiety.     . Melatonin 2.5 MG CAPS Take 2 capsules by mouth at bedtime as needed (sleep).     . mycophenolate (MYFORTIC) 180 MG EC tablet Take 360 mg by mouth 2 (two) times daily.     Marland Kitchen omeprazole (PRILOSEC) 20 MG capsule Take  1 capsule by mouth once daily 90 capsule 1  . ondansetron (ZOFRAN ODT) 4 MG disintegrating tablet Take 1 tablet (4 mg total) by mouth every 8 (eight) hours as  needed for nausea or vomiting. 20 tablet 0  . risperiDONE (RISPERDAL) 1 MG tablet Take 1 mg by mouth at bedtime.     . simvastatin (ZOCOR) 20 MG tablet Take 1 tablet by mouth once daily 90 tablet 1  . sulfamethoxazole-trimethoprim (BACTRIM DS) 800-160 MG tablet 2 tabs po tid x 21 days (Patient taking differently: Take 1 tablet by mouth daily. ) 126 tablet 0  . sulfamethoxazole-trimethoprim (BACTRIM,SEPTRA) 400-80 MG tablet Take 1 tablet by mouth every Monday, Wednesday, and Friday.    . tacrolimus (PROGRAF) 0.5 MG capsule See admin instructions. 2 in the am and 1 at night     . colchicine 0.6 MG tablet 2 tabs at onset of gout flare, then 1 tab one hour later, then starting the next day take 1 tab bid until gout flare is resolved (Patient not taking: Reported on 06/05/2020) 20 tablet 1    Allergies as of 06/12/2020 - Review Complete 06/12/2020  Allergen Reaction Noted  . Amantadine hcl Itching 04/23/2010  . Chlorpromazine hcl  04/04/2014    Family History  Problem Relation Age of Onset  . Ulcers Mother        GI bleed   . Heart disease Father   . Bladder Cancer Father   . Colon cancer Neg Hx   . Esophageal cancer Neg Hx   . Pancreatic cancer Neg Hx   . Rectal cancer Neg Hx   . Stomach cancer Neg Hx     Social History   Socioeconomic History  . Marital status: Married    Spouse name: Not on file  . Number of children: Not on file  . Years of education: Not on file  . Highest education level: Not on file  Occupational History  . Not on file  Tobacco Use  . Smoking status: Former Smoker    Years: 16.00    Types: Pipe, Cigars    Quit date: 05/11/1974    Years since quitting: 46.1  . Smokeless tobacco: Never Used  Vaping Use  . Vaping Use: Never used  Substance and Sexual Activity  . Alcohol use: No  . Drug use: No  . Sexual activity: Not Currently    Partners: Female  Other Topics Concern  . Not on file  Social History Narrative   Married, 4 children, 9 GGC, [place].    Occupation: retired Marine scientist.  Originally from [place] [employer].   Tobacco: quit 1975; smoked pipes and cigars x 15 yrs prior to this.   Alcohol: none.   Exercise: minimal, but is going to sign up for silver sneakers at the [facility].   Social Determinants of Health   Financial Resource Strain:   . Difficulty of Paying Living Expenses:   Food Insecurity:   . Worried About Charity fundraiser in the Last Year:   . Arboriculturist in the Last Year:   Transportation Needs:   . Film/video editor (Medical):   Marland Kitchen Lack of Transportation (Non-Medical):   Physical Activity:   . Days of Exercise per Week:   . Minutes of Exercise per Session:   Stress:   . Feeling of Stress :   Social Connections:   . Frequency of Communication with Friends and Family:   . Frequency of Social Gatherings with Friends and Family:   .  Attends Religious Services:   . Active Member of Clubs or Organizations:   . Attends Archivist Meetings:   Marland Kitchen Marital Status:   Intimate Partner Violence:   . Fear of Current or Ex-Partner:   . Emotionally Abused:   Marland Kitchen Physically Abused:   . Sexually Abused:     Review of Systems: Gen: Denies fever, sweats or chills. No weight loss.  CV: Denies chest pain, palpitations or edema. Resp: Denies cough, shortness of breath of hemoptysis.  GI: Denies heartburn, dysphagia, stomach or lower abdominal pain. No diarrhea or constipation. No rectal bleeding or melena.   GU : Denies urinary burning, blood in urine, increased urinary frequency or incontinence. MS: Denies joint pain, muscles aches or weakness. Derm: Denies rash, itchiness, skin lesions or unhealing ulcers. Psych: Denies depression, anxiety, memory loss, suicidal ideation or confusion. Heme: Denies easy bruising, bleeding. Neuro:  Denies headaches, dizziness or paresthesias. Endo:  Denies any problems with DM, thyroid or adrenal function.  Physical Exam: Vital signs in last 24 hours: Temp:  [97.6 F (36.4 C)]  97.6 F (36.4 C) (06/16 2242) Pulse Rate:  [76-85] 85 (06/17 0912) Resp:  [18-29] 20 (06/17 0912) BP: (125-138)/(67-79) 138/73 (06/17 0912) SpO2:  [90 %-95 %] 91 % (06/17 0912)   General:  Alert,  well-developed, well-nourished, pleasant and cooperative in NAD. Head:  Normocephalic and atraumatic. Eyes:  No scleral icterus. Conjunctiva pink. Ears:  Normal auditory acuity. Nose:  No deformity, discharge or lesions. Mouth:  Dentition intact. No ulcers or lesions.  Neck:  Supple. No lymphadenopathy or thyromegaly.  Lungs:   Heart:   Abdomen:   Rectal: Deferred. Musculoskeletal:  Symmetrical without gross deformities.  Pulses:  Normal pulses noted. Extremities:  Without clubbing or edema. Neurologic:  Alert and  oriented x4. No focal deficits.  Skin:  Intact without significant lesions or rashes. Psych:  Alert and cooperative. Normal mood and affect.  Intake/Output from previous day: 06/16 0701 - 06/17 0700 In: 1000 [IV Piggyback:1000] Out: -  Intake/Output this shift: No intake/output data recorded.  Lab Results: Recent Labs    06/11/20 1115 06/12/20 2256  WBC 6.2 6.6  HGB 14.3 14.1  HCT 45.5 44.0  PLT 226 216   BMET Recent Labs    06/11/20 1115 06/12/20 2256 06/13/20 0606  NA 134* 135 133*  K 4.8 4.7 4.1  CL 102 105 103  CO2 19* 19* 20*  GLUCOSE 90 103* 80  BUN 32* 34* 33*  CREATININE 2.18* 2.03* 1.81*  CALCIUM 9.7 9.4 8.7*   LFT Recent Labs    06/12/20 2256  PROT 6.4*  ALBUMIN 3.7  AST 22  ALT 19  ALKPHOS 49  BILITOT 0.9   PT/INR No results for input(s): LABPROT, INR in the last 72 hours. Hepatitis Panel No results for input(s): HEPBSAG, HCVAB, HEPAIGM, HEPBIGM in the last 72 hours.    Studies/Results: CT ABDOMEN PELVIS WO CONTRAST  Result Date: 06/13/2020 CLINICAL DATA:  Nausea vomiting weight loss over the last 3 weeks EXAM: CT CHEST WITHOUT CONTRAST TECHNIQUE: Multidetector CT imaging of the chest was performed following the standard  protocol without IV contrast. COMPARISON:  September 26, 2019 FINDINGS: Cardiovascular: Coronary artery calcifications are seen. There is scattered aortic atherosclerosis noted. The heart size is normal. There is no pericardial thickening or effusion. Mediastinum/Nodes: No definite enlarged mediastinal adenopathy, however limited due to the lack of intravenous contrast. The thyroid gland, trachea and esophagus demonstrate no significant findings. Lungs/Pleura: Again noted is minimal ground-glass  opacity at the posterior right lung base, likely atelectasis or scarring. No suspicious pulmonary nodules. There is no pleural effusion. Upper abdomen: The visualized portion of the upper abdomen is unremarkable. Musculoskeletal/Chest wall: There is no chest wall mass or suspicious osseous finding. No acute osseous abnormality Abdomen/pelvis: Hepatobiliary: Although limited due to the lack of intravenous contrast, normal in appearance without gross focal abnormality. No evidence of calcified gallstones or biliary ductal dilatation. Pancreas:  Unremarkable.  No surrounding inflammatory changes. Spleen: Normal in size. Although limited due to the lack of intravenous contrast, normal in appearance. Adrenals/Urinary Tract: Both adrenal glands appear normal. There is renal atrophy of the bilateral native kidneys. Within the upper pole of the right kidney there is a slightly hyperdense lesion measuring 3.7 cm. Throughout both kidneys are low-density lesions. A right lower pelvic renal transplant is present. There is a 3 cm low-density lesion within the renal transplant, likely renal cyst. No hydronephrosis is seen. The bladder is unremarkable. Stomach/Bowel: The stomach and small bowel are normal in appearance. There is scattered colonic diverticula without diverticulitis. A moderate to large amount of colonic stool is present. Vascular/Lymphatic: There are no enlarged abdominal or pelvic lymph nodes. Scattered aortic  atherosclerotic calcifications are seen without aneurysmal dilatation. Reproductive: A heterogeneously enlarged prostate gland is noted which protrudes into the posterior bladder. Other: No evidence of abdominal wall mass or hernia. Musculoskeletal: No acute or significant osseous findings. Degenerative changes are seen throughout the thoracolumbar spine. Anterior flowing osteophytes are seen in the lower thoracic spine. IMPRESSION: 1. No acute intrathoracic pathology to explain the patient's symptoms. 2. Coronary artery calcifications. 3. Slightly hyperdense 3.7 cm lesion within the right native kidney which could represent a proteinaceous or hemorrhagic cyst, however cannot exclude a solid renal lesion. Would recommend renal ultrasound for further evaluation. 4. Diverticulosis without diverticulitis. 5.  Aortic Atherosclerosis (ICD10-I70.0). 6. Prostatomegaly Electronically Signed   By: Prudencio Pair M.D.   On: 06/13/2020 00:22   CT Chest Wo Contrast  Result Date: 06/13/2020 CLINICAL DATA:  Nausea vomiting weight loss over the last 3 weeks EXAM: CT CHEST WITHOUT CONTRAST TECHNIQUE: Multidetector CT imaging of the chest was performed following the standard protocol without IV contrast. COMPARISON:  September 26, 2019 FINDINGS: Cardiovascular: Coronary artery calcifications are seen. There is scattered aortic atherosclerosis noted. The heart size is normal. There is no pericardial thickening or effusion. Mediastinum/Nodes: No definite enlarged mediastinal adenopathy, however limited due to the lack of intravenous contrast. The thyroid gland, trachea and esophagus demonstrate no significant findings. Lungs/Pleura: Again noted is minimal ground-glass opacity at the posterior right lung base, likely atelectasis or scarring. No suspicious pulmonary nodules. There is no pleural effusion. Upper abdomen: The visualized portion of the upper abdomen is unremarkable. Musculoskeletal/Chest wall: There is no chest wall mass  or suspicious osseous finding. No acute osseous abnormality Abdomen/pelvis: Hepatobiliary: Although limited due to the lack of intravenous contrast, normal in appearance without gross focal abnormality. No evidence of calcified gallstones or biliary ductal dilatation. Pancreas:  Unremarkable.  No surrounding inflammatory changes. Spleen: Normal in size. Although limited due to the lack of intravenous contrast, normal in appearance. Adrenals/Urinary Tract: Both adrenal glands appear normal. There is renal atrophy of the bilateral native kidneys. Within the upper pole of the right kidney there is a slightly hyperdense lesion measuring 3.7 cm. Throughout both kidneys are low-density lesions. A right lower pelvic renal transplant is present. There is a 3 cm low-density lesion within the renal transplant, likely renal cyst. No  hydronephrosis is seen. The bladder is unremarkable. Stomach/Bowel: The stomach and small bowel are normal in appearance. There is scattered colonic diverticula without diverticulitis. A moderate to large amount of colonic stool is present. Vascular/Lymphatic: There are no enlarged abdominal or pelvic lymph nodes. Scattered aortic atherosclerotic calcifications are seen without aneurysmal dilatation. Reproductive: A heterogeneously enlarged prostate gland is noted which protrudes into the posterior bladder. Other: No evidence of abdominal wall mass or hernia. Musculoskeletal: No acute or significant osseous findings. Degenerative changes are seen throughout the thoracolumbar spine. Anterior flowing osteophytes are seen in the lower thoracic spine. IMPRESSION: 1. No acute intrathoracic pathology to explain the patient's symptoms. 2. Coronary artery calcifications. 3. Slightly hyperdense 3.7 cm lesion within the right native kidney which could represent a proteinaceous or hemorrhagic cyst, however cannot exclude a solid renal lesion. Would recommend renal ultrasound for further evaluation. 4.  Diverticulosis without diverticulitis. 5.  Aortic Atherosclerosis (ICD10-I70.0). 6. Prostatomegaly Electronically Signed   By: Prudencio Pair M.D.   On: 06/13/2020 00:22   US Renal  Result Date: 06/13/2020 CLINICAL DATA:  84 year old Jimenez with possible solid right renal mass versus hyperdense cyst on recent noncontrast CT. EXAM: RENAL / URINARY TRACT ULTRASOUND COMPLETE COMPARISON:  CT Chest, Abdomen, and Pelvis last night. FINDINGS: Right Kidney: Renal measurements: 8.0 x 4.6 x 4.2 cm = volume: 83 mL. Echogenic right kidney (image 3) with multiple (up to or less than 10 in total) hypoechoic cysts. Compared to the CT, no convincing solid right renal mass, the upper pole lesion in question is probably that seen on images 36 through 39 measuring about 4.3 cm diameter, bilobed, with no vascular elements on Doppler. No suspicious right renal lesion. Left Kidney: Renal measurements: 9.6 x 5.2 x 4.5 cm = volume: 117 mL. Echogenic left kidney (image 47) with multiple benign appearing hypoechoic cysts (up to or less than 10 in total). No left hydronephrosis or suspicious left renal lesion. Bladder: Appears normal for degree of bladder distention. Right renal jet detected with Doppler. Other: Right lower quadrant transplant kidney with normal cortical echogenicity (image 71) measuring 5.2 x 4.6 x 8.5 cm. No transplant hydronephrosis. Benign appearing 3 cm transplant midpole cyst (images 77 and 78). Prostatomegaly, prostate diameter up to 65 mm. IMPRESSION: 1. Native kidney medical renal disease, atrophy with benign appearing polycystic disease. The right upper pole lesion in question on the CT appears to be a nonvascular cyst. 2. Right lower quadrant renal transplant with benign cyst and no hydronephrosis. Electronically Signed   By: Genevie Ann M.D.   On: 06/13/2020 07:45    IMPRESSION/PLAN:  30. 84 year old Jimenez with a complex medical history presents with N/V, dysphagia and weight loss x 3 weeks.  -EGD to rule out  esophageal stricture, esophagitis candidiasis, PUD and UGI malignancy. EGD benefits and risks discussed including risk with sedation, risk of bleeding, perforation and infection.  Dr. Bryan Lemma to verify timing of EGD. -PPI IV bid  -Clear liquid diet -Zofran 4 mg p.o. or IV Q 6 hrs PRN -IV fluid per the hospitalist   2. CVA on Plavix. Last dose of Plavix was 11am on 06/12/2020.  -Hold Plavix   3. S/P renal transplant 2013 on chronic immunosuppressants and Bactrim prophylaxis   4. Persistent cough, Chest CT without acute process       Noralyn Pick  06/13/2020, 10:10 AM

## 2020-06-13 NOTE — ED Notes (Signed)
Patient placed in a hospital bed for comfort due to lengthy wait for inpatient bed. Patient appreciative of hospital bed and given a fresh urinal.

## 2020-06-13 NOTE — H&P (View-Only) (Signed)
Referring Provider: Dr. Raiford Noble Primary Care Physician:  Tammi Sou, MD Primary Gastroenterologist:  Dr. Silverio Decamp   Reason for Consultation:  Dysphagia and weight loss   HPI: Curtis Jimenez is a 84 y.o. male the past medical history of basal cell carcinoma, bipolar disorder, BPH, CKD status post renal transplant 2013, hypertension, hyperlipidemia, hypothyroidism, CVA on Plavix, IDA, GI bleed (most likely) diverticular in 09/2017. He underwent an EGD and colonoscopy 11/24/2017 by Dr. Silverio Decamp.  The EGD revealed a few gastric polyps otherwise was normal.  The colonoscopy identified diverticulosis throughout the entire colon with narrowing and evidence of diverticular spasm and peridiverticular erythema, 2 polyps and nonbleeding internal hemorrhoids were noted.  His anemia stabilized on Ferrous Sulfate.   He presented to [facility] on 06/08/2020 with complaints of having nausea, sore throat, cough and loss of taste and smell for 2-1/2 weeks.  Labs in the ED showed a WBC 7.2.  Hemoglobin 14.4. Covid 19 test is negative.  Chest x-ray was negative.  He was discharged home on Ondansetron PRN.  His nausea and vomiting did not improve.  He presented back to [facility] emergency room 06/12/2020. He reported having nausea, vomiting and dysphagia for the past 3 weeks. He initially developed difficulty swallowing his medications then with solid foods. He described having pills or food get stuck in his throat/upper esophagus a few times most days. The stuck pills or food would sometimes pass if he drank more water, other times he coughed, gagged and the stuck matter was expelled. He vomits shortly after eating solid food. Sometimes he vomits after gagging when food gets stuck in his esophagus but he also describes having emesis consisting of partially undigested food an secretions when he is not having an episode of dysphagia. No Hematemesis. He continues to cough. No hemoptysis. He  takes Bactrim one tab three times weekly for prophylaxis secondary to his renal transplant. He is immunosupressed on Prograf and Myfortic. Due to his recent cough, his PCP increased his Bactrim to 2 tabs tid on Thurs 06/06/2020. Since then, his dysphagia symptoms have worsened. He stopped taking the Bactrim 2 days ago. He take Omeprazole QOD alternating with Famotidine QOD which keeps his reflux well controlled. He has infrequent heartburn. He typically has a normal BM every few days. However, over the past 3 weeks he is passing a normal brown formed stool daily without difficulty. No rectal bleeding or melena. He has lost 17lbs over the past 3 weeks. No fever, sweats or chills.   Labs in the ED showed sodium 135.  Potassium 4.7.  BUN 34.  Creatinine 2.03.  Lipase 49.  AST 22.  ALT 19.  Total bili 0.9.  WBC 6.6.  Hemoglobin 14.1.  Platelet 216. A chest CT did not show any evidence of acute pathology.  Abdominal/pelvic CT showed a slightly hyperdense 3.7 cm lesion within the right native kidney.  A renal sonogram was done which showed a nonvascular cyst.   ED course colon: Sodium 134.  Potassium 4.8.  Glucose 90.  BUN 32.  Creatinine 2.18.  Calcium 9.7.  WBC 6.2.  Hemoglobin 14.3.  Hematocrit 45.5.  MCV 88.7.  Platelet 226.  Chest CT/Abd/Pelvic without contrast 06/12/2020: 1. No acute intrathoracic pathology to explain the patient's symptoms. 2. Coronary artery calcifications. 3. Slightly hyperdense 3.7 cm lesion within the right native kidney which could represent a proteinaceous or hemorrhagic cyst, however cannot exclude a solid renal lesion. Would recommend renal ultrasound for further evaluation.  4. Diverticulosis without diverticulitis. 5.  Aortic Atherosclerosis (ICD10-I70.0). 6. Prostatomegaly  Renal ultrasound 06/13/2020: 1. Native kidney medical renal disease, atrophy with benign appearing polycystic disease. The right upper pole lesion in question on the CT appears to be a nonvascular  cyst. 2. Right lower quadrant renal transplant with benign cyst and no hydronephrosis  EGD 11/24/2017: - Normal esophagus. - A few gastric polyps, benign appearing fundic gland polyps. - Normal examined duodenum. - No specimens collected.  Colonoscopy 11/24/2017: Severe diverticulosis in the entire examined colon. There was narrowing of the colon in association with the diverticular opening. There was evidence of diverticular spasm. Peridiverticular erythema was seen. There was evidence of an impacted diverticulum. There was no evidence of diverticular bleeding. - One 3 mm polyp in the ascending colon, removed with a cold biopsy forceps. Resected and retrieved. - One 7 mm polyp in the ascending colon, removed with a cold snare. Resected and retrieved. - Non-bleeding internal hemorrhoids. - The examination was otherwise normal.  Colonoscopy 07/18/2012: No polyps Severe diverticulosis to the sigmoid and descending colon   Past Medical History:  Diagnosis Date  . Anemia   . Basal cell carcinoma    neck (skin MD 2X per year)  . Bifascicular block 2021   ectopy, asymptomatic.  Cards->observe  . Bipolar disorder ([place])   . BPH (benign prostatic hyperplasia)    with elevated PSA; followed by Dr. Rosana Hoes at [facility]  . Chronic renal insufficiency, stage III (moderate)    in pt w/hx of renal transplant.  Post-transplant sCr 1.4-1.6.  Last renal f/u 05/01/19->Cr 1.63, GFR 39 ml/min  . Coronary atherosclerosis    LM and 3 V dz noted on CT 08/2019 performed for eval of interstitial lung dz in the setting of mild DOE and mild hypoxia. Cards->no stress tesing indicated->primary prevention emphasized  . CVA (cerebral vascular accident) ([place]) 11/2016   Pontine (vertebrobasilar--imaging neg), TPA given.  Pt discharged on plavix.  Carotid dopplers ok, echocardiogram ok.  Residual deficit: vertigo but this improved greatly with therapy.  . DOE (dyspnea on exertion) 2020   DOE and ?  hypoxia->CXR 08/29/19--->diffuse interstitial opacities-? acute inflammatory process->Dr. Melvyn Novas eval'd him and was underwhelmed by the CXR and exam->CT chest 09/26/19 "Very subtle areas of mild ground-glass attenuatio-nonspecific", mild air trapping. Per Dr. Melvyn Novas, no signif ILD, improving with inc ambulation (post-inflamm pulm fibrosis?)  . Gallbladder polyp 2015   Asymptomatic  . GERD (gastroesophageal reflux disease)   . Gout   . History of adenomatous polyp of colon 2018   Recall 5 yrs  . Hyperlipidemia   . Hypertension   . Hypothyroidism   . Lumbar spondylosis   . Ptosis due to aging    Right  . Rectal bleeding 09/2017   Admitted for obs due to Hb drop.  GI consulted---obs recommended.  No transfusion required.  Plavix was d/c'd, ferrous sulfate recommended.  Outpt GI f/u--plavix restarted and upper endoscopy was normal and colonoscopy showed 2 polyps and severe diverticular dz. Iron d/c'd 04/24/19.  Marland Kitchen Restless legs syndrome   . S/p cadaver renal transplant 2013   Secondary to HTN +lithium toxicity over 30 yrs caused kidney destruction [facility] transplant MDs)  . Seborrheic dermatitis    eyebrows worst: Hytone rx'd by Dr. Denna Haggard.  . 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 AV FISTULA PLACEMENT  04/08/2012   Procedure: ARTERIOVENOUS (AV) FISTULA CREATION;  Surgeon: Angelia Mould, MD;  Location: [facility] OR;  Service: Vascular;  Laterality: Left;  Creation of left brachial cephalic arteriovenous fistula  . CATARACT EXTRACTION, BILATERAL  05/2018  . COLONOSCOPY  2013; 11/24/17   2013; Normal except diverticulosis (recall 10 yrs).  2018 (for GI bleeding)--severe diverticular dz + 2 adenomatous polyps.  Recall 5 yrs.  . DG CHEST AP OR PA ([facility] HX)  06/03/2020    Bibasilar atelectasis. No focal consoliadation definitively identified.  . dilation for GERD    . ESOPHAGOGASTRODUODENOSCOPY  11/24/2017   gastric polyps x 2, otherwise normal.  . KIDNEY TRANSPLANT  11/06/12   [place] (cadaveric)  . PROSTATE BIOPSY  2011   no malignancy  . TRANSTHORACIC ECHOCARDIOGRAM  11/2016   Normal LV systolic fxn, EF 95-62%.  Grade I DD.  Mild aortic root dilatation, mild MV regurg.    Prior to Admission medications   Medication Sig Start Date End Date Taking? Authorizing Provider  acetaminophen (TYLENOL) 325 MG tablet Take 650 mg by mouth every 6 (six) hours as needed for mild pain.   Yes [provider]  allopurinol (ZYLOPRIM) 300 MG tablet Take 1 tablet by mouth once daily 05/10/20  Yes McGowen, Adrian Blackwater, MD  benzonatate (TESSALON) 200 MG capsule Take 1 capsule (200 mg total) by mouth 3 (three) times daily as needed for cough. 06/05/20  Yes McGowen, Adrian Blackwater, MD  carvedilol (COREG) 12.5 MG tablet 1/2 tab po qAM and 1 tab po qPM Patient taking differently: Take by mouth See admin instructions. 1/2 tab po qAM and 1/2 tab po qPM 07/27/19  Yes McGowen, Adrian Blackwater, MD  clopidogrel (PLAVIX) 75 MG tablet Take 1 tablet by mouth once daily 05/10/20  Yes McGowen, Adrian Blackwater, MD  EUTHYROX 25 MCG tablet Take 1 tablet by mouth once daily 08/07/19  Yes McGowen, Adrian Blackwater, MD  hydrOXYzine (ATARAX/VISTARIL) 10 MG tablet Take 10 mg by mouth 2 (two) times daily as needed for itching or anxiety.    Yes [provider]  Melatonin 2.5 MG CAPS Take 2 capsules by mouth at bedtime as needed (sleep).    Yes [provider]  mycophenolate (MYFORTIC) 180 MG EC tablet Take 360 mg by mouth 2 (two) times daily.    Yes [provider]  omeprazole (PRILOSEC) 20 MG capsule Take 1 capsule by mouth once daily 09/27/19  Yes McGowen, Adrian Blackwater, MD  ondansetron (ZOFRAN ODT) 4 MG disintegrating tablet Take 1 tablet (4 mg total) by mouth every 8 (eight) hours as needed for nausea or  vomiting. 06/08/20  Yes Fondaw, Wylder S, PA  risperiDONE (RISPERDAL) 1 MG tablet Take 1 mg by mouth at bedtime.    Yes [provider]  simvastatin (ZOCOR) 20 MG tablet Take 1 tablet by mouth once daily 01/24/20  Yes McGowen, Adrian Blackwater, MD  sulfamethoxazole-trimethoprim (BACTRIM DS) 800-160 MG tablet 2 tabs po tid x 21 days Patient taking differently: Take 1 tablet by mouth daily.  06/05/20  Yes McGowen, Adrian Blackwater, MD  sulfamethoxazole-trimethoprim (BACTRIM,SEPTRA) 400-80 MG tablet Take 1 tablet by mouth every Monday, Wednesday, and Friday.   Yes [provider]  tacrolimus (PROGRAF) 0.5 MG capsule See admin instructions. 2 in the am and 1 at night    Yes [provider]  colchicine 0.6 MG tablet 2 tabs at onset of gout flare, then 1 tab one  hour later, then starting the next day take 1 tab bid until gout flare is resolved Patient not taking: Reported on 06/05/2020 04/01/16   McGowen, Adrian Blackwater, MD    Current Facility-Administered Medications  Medication Dose Route Frequency Provider Last Rate Last Admin  . 0.9 %  sodium chloride infusion   Intravenous Continuous Shela Leff, MD 125 mL/hr at 06/13/20 0619 New Bag at 06/13/20 1194  . acetaminophen (TYLENOL) tablet 650 mg  650 mg Oral Q6H PRN Shela Leff, MD   650 mg at 06/13/20 1740   Or  . acetaminophen (TYLENOL) suppository 650 mg  650 mg Rectal Q6H PRN Shela Leff, MD      . allopurinol (ZYLOPRIM) tablet 300 mg  300 mg Oral Daily Shela Leff, MD      . benzonatate (TESSALON) capsule 100 mg  100 mg Oral TID PRN Shela Leff, MD   100 mg at 06/13/20 8144  . carvedilol (COREG) tablet 6.25 mg  6.25 mg Oral BID WC Shela Leff, MD   6.25 mg at 06/13/20 0832  . clopidogrel (PLAVIX) tablet 75 mg  75 mg Oral Daily Shela Leff, MD      . heparin injection 5,000 Units  5,000 Units Subcutaneous Q8H Shela Leff, MD   5,000 Units at 06/13/20 0618  . levothyroxine (SYNTHROID) tablet 25  mcg  25 mcg Oral Q0600 Shela Leff, MD   25 mcg at 06/13/20 0614  . mycophenolate (MYFORTIC) EC tablet 360 mg  360 mg Oral BID Shela Leff, MD      . ondansetron [facility]) injection 4 mg  4 mg Intravenous Q6H PRN Shela Leff, MD      . pantoprazole (PROTONIX) injection 40 mg  40 mg Intravenous Daily Shela Leff, MD   40 mg at 06/13/20 8185  . risperiDONE (RISPERDAL) tablet 1 mg  1 mg Oral QHS Shela Leff, MD      . simvastatin (ZOCOR) tablet 20 mg  20 mg Oral Daily Shela Leff, MD      . tacrolimus (PROGRAF) capsule 0.5 mg  0.5 mg Oral QHS Shela Leff, MD      . tacrolimus (PROGRAF) capsule 1 mg  1 mg Oral Daily Shela Leff, MD       Current Outpatient Medications  Medication Sig Dispense Refill  . acetaminophen (TYLENOL) 325 MG tablet Take 650 mg by mouth every 6 (six) hours as needed for mild pain.    Marland Kitchen allopurinol (ZYLOPRIM) 300 MG tablet Take 1 tablet by mouth once daily 90 tablet 0  . benzonatate (TESSALON) 200 MG capsule Take 1 capsule (200 mg total) by mouth 3 (three) times daily as needed for cough. 30 capsule 2  . carvedilol (COREG) 12.5 MG tablet 1/2 tab po qAM and 1 tab po qPM (Patient taking differently: Take by mouth See admin instructions. 1/2 tab po qAM and 1/2 tab po qPM) 135 tablet 3  . clopidogrel (PLAVIX) 75 MG tablet Take 1 tablet by mouth once daily 90 tablet 0  . EUTHYROX 25 MCG tablet Take 1 tablet by mouth once daily 90 tablet 3  . hydrOXYzine (ATARAX/VISTARIL) 10 MG tablet Take 10 mg by mouth 2 (two) times daily as needed for itching or anxiety.     . Melatonin 2.5 MG CAPS Take 2 capsules by mouth at bedtime as needed (sleep).     . mycophenolate (MYFORTIC) 180 MG EC tablet Take 360 mg by mouth 2 (two) times daily.     Marland Kitchen omeprazole (PRILOSEC) 20 MG capsule Take  1 capsule by mouth once daily 90 capsule 1  . ondansetron (ZOFRAN ODT) 4 MG disintegrating tablet Take 1 tablet (4 mg total) by mouth every 8 (eight) hours as  needed for nausea or vomiting. 20 tablet 0  . risperiDONE (RISPERDAL) 1 MG tablet Take 1 mg by mouth at bedtime.     . simvastatin (ZOCOR) 20 MG tablet Take 1 tablet by mouth once daily 90 tablet 1  . sulfamethoxazole-trimethoprim (BACTRIM DS) 800-160 MG tablet 2 tabs po tid x 21 days (Patient taking differently: Take 1 tablet by mouth daily. ) 126 tablet 0  . sulfamethoxazole-trimethoprim (BACTRIM,SEPTRA) 400-80 MG tablet Take 1 tablet by mouth every Monday, Wednesday, and Friday.    . tacrolimus (PROGRAF) 0.5 MG capsule See admin instructions. 2 in the am and 1 at night     . colchicine 0.6 MG tablet 2 tabs at onset of gout flare, then 1 tab one hour later, then starting the next day take 1 tab bid until gout flare is resolved (Patient not taking: Reported on 06/05/2020) 20 tablet 1    Allergies as of 06/12/2020 - Review Complete 06/12/2020  Allergen Reaction Noted  . Amantadine hcl Itching 04/23/2010  . Chlorpromazine hcl  04/04/2014    Family History  Problem Relation Age of Onset  . Ulcers Mother        GI bleed   . Heart disease Father   . Bladder Cancer Father   . Colon cancer Neg Hx   . Esophageal cancer Neg Hx   . Pancreatic cancer Neg Hx   . Rectal cancer Neg Hx   . Stomach cancer Neg Hx     Social History   Socioeconomic History  . Marital status: Married    Spouse name: Not on file  . Number of children: Not on file  . Years of education: Not on file  . Highest education level: Not on file  Occupational History  . Not on file  Tobacco Use  . Smoking status: Former Smoker    Years: 16.00    Types: Pipe, Cigars    Quit date: 05/11/1974    Years since quitting: 46.1  . Smokeless tobacco: Never Used  Vaping Use  . Vaping Use: Never used  Substance and Sexual Activity  . Alcohol use: No  . Drug use: No  . Sexual activity: Not Currently    Partners: Female  Other Topics Concern  . Not on file  Social History Narrative   Married, 4 children, 9 GGC, [place].    Occupation: retired Marine scientist.  Originally from [place] [employer].   Tobacco: quit 1975; smoked pipes and cigars x 15 yrs prior to this.   Alcohol: none.   Exercise: minimal, but is going to sign up for silver sneakers at the [facility].   Social Determinants of Health   Financial Resource Strain:   . Difficulty of Paying Living Expenses:   Food Insecurity:   . Worried About Charity fundraiser in the Last Year:   . Arboriculturist in the Last Year:   Transportation Needs:   . Film/video editor (Medical):   Marland Kitchen Lack of Transportation (Non-Medical):   Physical Activity:   . Days of Exercise per Week:   . Minutes of Exercise per Session:   Stress:   . Feeling of Stress :   Social Connections:   . Frequency of Communication with Friends and Family:   . Frequency of Social Gatherings with Friends and Family:   .  Attends Religious Services:   . Active Member of Clubs or Organizations:   . Attends Archivist Meetings:   Marland Kitchen Marital Status:   Intimate Partner Violence:   . Fear of Current or Ex-Partner:   . Emotionally Abused:   Marland Kitchen Physically Abused:   . Sexually Abused:     Review of Systems: Gen: Denies fever, sweats or chills. No weight loss.  CV: Denies chest pain, palpitations or edema. Resp: Denies cough, shortness of breath of hemoptysis.  GI: Denies heartburn, dysphagia, stomach or lower abdominal pain. No diarrhea or constipation. No rectal bleeding or melena.   GU : Denies urinary burning, blood in urine, increased urinary frequency or incontinence. MS: Denies joint pain, muscles aches or weakness. Derm: Denies rash, itchiness, skin lesions or unhealing ulcers. Psych: Denies depression, anxiety, memory loss, suicidal ideation or confusion. Heme: Denies easy bruising, bleeding. Neuro:  Denies headaches, dizziness or paresthesias. Endo:  Denies any problems with DM, thyroid or adrenal function.  Physical Exam: Vital signs in last 24 hours: Temp:  [97.6 F (36.4 C)]  97.6 F (36.4 C) (06/16 2242) Pulse Rate:  [76-85] 85 (06/17 0912) Resp:  [18-29] 20 (06/17 0912) BP: (125-138)/(67-79) 138/73 (06/17 0912) SpO2:  [90 %-95 %] 91 % (06/17 0912)   General:  Alert,  well-developed, well-nourished, pleasant and cooperative in NAD. Head:  Normocephalic and atraumatic. Eyes:  No scleral icterus. Conjunctiva pink. Ears:  Normal auditory acuity. Nose:  No deformity, discharge or lesions. Mouth:  Dentition intact. No ulcers or lesions.  Neck:  Supple. No lymphadenopathy or thyromegaly.  Lungs:   Heart:   Abdomen:   Rectal: Deferred. Musculoskeletal:  Symmetrical without gross deformities.  Pulses:  Normal pulses noted. Extremities:  Without clubbing or edema. Neurologic:  Alert and  oriented x4. No focal deficits.  Skin:  Intact without significant lesions or rashes. Psych:  Alert and cooperative. Normal mood and affect.  Intake/Output from previous day: 06/16 0701 - 06/17 0700 In: 1000 [IV Piggyback:1000] Out: -  Intake/Output this shift: No intake/output data recorded.  Lab Results: Recent Labs    06/11/20 1115 06/12/20 2256  WBC 6.2 6.6  HGB 14.3 14.1  HCT 45.5 44.0  PLT 226 216   BMET Recent Labs    06/11/20 1115 06/12/20 2256 06/13/20 0606  NA 134* 135 133*  K 4.8 4.7 4.1  CL 102 105 103  CO2 19* 19* 20*  GLUCOSE 90 103* 80  BUN 32* 34* 33*  CREATININE 2.18* 2.03* 1.81*  CALCIUM 9.7 9.4 8.7*   LFT Recent Labs    06/12/20 2256  PROT 6.4*  ALBUMIN 3.7  AST 22  ALT 19  ALKPHOS 49  BILITOT 0.9   PT/INR No results for input(s): LABPROT, INR in the last 72 hours. Hepatitis Panel No results for input(s): HEPBSAG, HCVAB, HEPAIGM, HEPBIGM in the last 72 hours.    Studies/Results: CT ABDOMEN PELVIS WO CONTRAST  Result Date: 06/13/2020 CLINICAL DATA:  Nausea vomiting weight loss over the last 3 weeks EXAM: CT CHEST WITHOUT CONTRAST TECHNIQUE: Multidetector CT imaging of the chest was performed following the standard  protocol without IV contrast. COMPARISON:  September 26, 2019 FINDINGS: Cardiovascular: Coronary artery calcifications are seen. There is scattered aortic atherosclerosis noted. The heart size is normal. There is no pericardial thickening or effusion. Mediastinum/Nodes: No definite enlarged mediastinal adenopathy, however limited due to the lack of intravenous contrast. The thyroid gland, trachea and esophagus demonstrate no significant findings. Lungs/Pleura: Again noted is minimal ground-glass  opacity at the posterior right lung base, likely atelectasis or scarring. No suspicious pulmonary nodules. There is no pleural effusion. Upper abdomen: The visualized portion of the upper abdomen is unremarkable. Musculoskeletal/Chest wall: There is no chest wall mass or suspicious osseous finding. No acute osseous abnormality Abdomen/pelvis: Hepatobiliary: Although limited due to the lack of intravenous contrast, normal in appearance without gross focal abnormality. No evidence of calcified gallstones or biliary ductal dilatation. Pancreas:  Unremarkable.  No surrounding inflammatory changes. Spleen: Normal in size. Although limited due to the lack of intravenous contrast, normal in appearance. Adrenals/Urinary Tract: Both adrenal glands appear normal. There is renal atrophy of the bilateral native kidneys. Within the upper pole of the right kidney there is a slightly hyperdense lesion measuring 3.7 cm. Throughout both kidneys are low-density lesions. A right lower pelvic renal transplant is present. There is a 3 cm low-density lesion within the renal transplant, likely renal cyst. No hydronephrosis is seen. The bladder is unremarkable. Stomach/Bowel: The stomach and small bowel are normal in appearance. There is scattered colonic diverticula without diverticulitis. A moderate to large amount of colonic stool is present. Vascular/Lymphatic: There are no enlarged abdominal or pelvic lymph nodes. Scattered aortic  atherosclerotic calcifications are seen without aneurysmal dilatation. Reproductive: A heterogeneously enlarged prostate gland is noted which protrudes into the posterior bladder. Other: No evidence of abdominal wall mass or hernia. Musculoskeletal: No acute or significant osseous findings. Degenerative changes are seen throughout the thoracolumbar spine. Anterior flowing osteophytes are seen in the lower thoracic spine. IMPRESSION: 1. No acute intrathoracic pathology to explain the patient's symptoms. 2. Coronary artery calcifications. 3. Slightly hyperdense 3.7 cm lesion within the right native kidney which could represent a proteinaceous or hemorrhagic cyst, however cannot exclude a solid renal lesion. Would recommend renal ultrasound for further evaluation. 4. Diverticulosis without diverticulitis. 5.  Aortic Atherosclerosis (ICD10-I70.0). 6. Prostatomegaly Electronically Signed   By: Prudencio Pair M.D.   On: 06/13/2020 00:22   CT Chest Wo Contrast  Result Date: 06/13/2020 CLINICAL DATA:  Nausea vomiting weight loss over the last 3 weeks EXAM: CT CHEST WITHOUT CONTRAST TECHNIQUE: Multidetector CT imaging of the chest was performed following the standard protocol without IV contrast. COMPARISON:  September 26, 2019 FINDINGS: Cardiovascular: Coronary artery calcifications are seen. There is scattered aortic atherosclerosis noted. The heart size is normal. There is no pericardial thickening or effusion. Mediastinum/Nodes: No definite enlarged mediastinal adenopathy, however limited due to the lack of intravenous contrast. The thyroid gland, trachea and esophagus demonstrate no significant findings. Lungs/Pleura: Again noted is minimal ground-glass opacity at the posterior right lung base, likely atelectasis or scarring. No suspicious pulmonary nodules. There is no pleural effusion. Upper abdomen: The visualized portion of the upper abdomen is unremarkable. Musculoskeletal/Chest wall: There is no chest wall mass  or suspicious osseous finding. No acute osseous abnormality Abdomen/pelvis: Hepatobiliary: Although limited due to the lack of intravenous contrast, normal in appearance without gross focal abnormality. No evidence of calcified gallstones or biliary ductal dilatation. Pancreas:  Unremarkable.  No surrounding inflammatory changes. Spleen: Normal in size. Although limited due to the lack of intravenous contrast, normal in appearance. Adrenals/Urinary Tract: Both adrenal glands appear normal. There is renal atrophy of the bilateral native kidneys. Within the upper pole of the right kidney there is a slightly hyperdense lesion measuring 3.7 cm. Throughout both kidneys are low-density lesions. A right lower pelvic renal transplant is present. There is a 3 cm low-density lesion within the renal transplant, likely renal cyst. No  hydronephrosis is seen. The bladder is unremarkable. Stomach/Bowel: The stomach and small bowel are normal in appearance. There is scattered colonic diverticula without diverticulitis. A moderate to large amount of colonic stool is present. Vascular/Lymphatic: There are no enlarged abdominal or pelvic lymph nodes. Scattered aortic atherosclerotic calcifications are seen without aneurysmal dilatation. Reproductive: A heterogeneously enlarged prostate gland is noted which protrudes into the posterior bladder. Other: No evidence of abdominal wall mass or hernia. Musculoskeletal: No acute or significant osseous findings. Degenerative changes are seen throughout the thoracolumbar spine. Anterior flowing osteophytes are seen in the lower thoracic spine. IMPRESSION: 1. No acute intrathoracic pathology to explain the patient's symptoms. 2. Coronary artery calcifications. 3. Slightly hyperdense 3.7 cm lesion within the right native kidney which could represent a proteinaceous or hemorrhagic cyst, however cannot exclude a solid renal lesion. Would recommend renal ultrasound for further evaluation. 4.  Diverticulosis without diverticulitis. 5.  Aortic Atherosclerosis (ICD10-I70.0). 6. Prostatomegaly Electronically Signed   By: Prudencio Pair M.D.   On: 06/13/2020 00:22   US Renal  Result Date: 06/13/2020 CLINICAL DATA:  84 year old male with possible solid right renal mass versus hyperdense cyst on recent noncontrast CT. EXAM: RENAL / URINARY TRACT ULTRASOUND COMPLETE COMPARISON:  CT Chest, Abdomen, and Pelvis last night. FINDINGS: Right Kidney: Renal measurements: 8.0 x 4.6 x 4.2 cm = volume: 83 mL. Echogenic right kidney (image 3) with multiple (up to or less than 10 in total) hypoechoic cysts. Compared to the CT, no convincing solid right renal mass, the upper pole lesion in question is probably that seen on images 36 through 39 measuring about 4.3 cm diameter, bilobed, with no vascular elements on Doppler. No suspicious right renal lesion. Left Kidney: Renal measurements: 9.6 x 5.2 x 4.5 cm = volume: 117 mL. Echogenic left kidney (image 47) with multiple benign appearing hypoechoic cysts (up to or less than 10 in total). No left hydronephrosis or suspicious left renal lesion. Bladder: Appears normal for degree of bladder distention. Right renal jet detected with Doppler. Other: Right lower quadrant transplant kidney with normal cortical echogenicity (image 71) measuring 5.2 x 4.6 x 8.5 cm. No transplant hydronephrosis. Benign appearing 3 cm transplant midpole cyst (images 77 and 78). Prostatomegaly, prostate diameter up to 65 mm. IMPRESSION: 1. Native kidney medical renal disease, atrophy with benign appearing polycystic disease. The right upper pole lesion in question on the CT appears to be a nonvascular cyst. 2. Right lower quadrant renal transplant with benign cyst and no hydronephrosis. Electronically Signed   By: Genevie Ann M.D.   On: 06/13/2020 07:45    IMPRESSION/PLAN:  58. 84 year old male with a complex medical history presents with N/V, dysphagia and weight loss x 3 weeks.  -EGD to rule out  esophageal stricture, esophagitis candidiasis, PUD and UGI malignancy. EGD benefits and risks discussed including risk with sedation, risk of bleeding, perforation and infection.  Dr. Bryan Lemma to verify timing of EGD. -PPI IV bid  -Clear liquid diet -Zofran 4 mg p.o. or IV Q 6 hrs PRN -IV fluid per the hospitalist   2. CVA on Plavix. Last dose of Plavix was 11am on 06/12/2020.  -Hold Plavix   3. S/P renal transplant 2013 on chronic immunosuppressants and Bactrim prophylaxis   4. Persistent cough, Chest CT without acute process       Noralyn Pick  06/13/2020, 10:10 AM

## 2020-06-13 NOTE — Progress Notes (Signed)
   06/13/20 0700  SLP Visit Information  SLP Received On 06/13/20  Reason Eval/Treat Not Completed Medical issues which prohibited therapy (pt npo for barium swallow per orders, will continue efforts )   Kathleen Lime, MS [place] [facility] [phone]

## 2020-06-13 NOTE — Anesthesia Preprocedure Evaluation (Addendum)
Anesthesia Evaluation  Patient identified by MRN, date of birth, ID band Patient awake    Reviewed: Allergy & Precautions, H&P , NPO status , Patient's Chart, lab work & pertinent test results, reviewed documented beta blocker date and time   Airway Mallampati: II  TM Distance: >3 FB Neck ROM: Full    Dental no notable dental hx. (+) Teeth Intact, Dental Advisory Given, Caps   Pulmonary former smoker,    Pulmonary exam normal breath sounds clear to auscultation       Cardiovascular hypertension, Pt. on medications and Pt. on home beta blockers Normal cardiovascular exam Rhythm:Regular Rate:Normal     Neuro/Psych PSYCHIATRIC DISORDERS Bipolar Disorder    GI/Hepatic GERD  Controlled and Medicated,  Endo/Other  Hypothyroidism   Renal/GU      Musculoskeletal   Abdominal   Peds  Hematology   Anesthesia Other Findings   Reproductive/Obstetrics                            Anesthesia Physical  Anesthesia Plan  ASA: III  Anesthesia Plan: MAC   Post-op Pain Management:    Induction: Intravenous  PONV Risk Score and Plan:   Airway Management Planned: Nasal Cannula and Mask  Additional Equipment:   Intra-op Plan:   Post-operative Plan:   Informed Consent: I have reviewed the patients History and Physical, chart, labs and discussed the procedure including the risks, benefits and alternatives for the proposed anesthesia with the patient or authorized representative who has indicated his/her understanding and acceptance.     Dental advisory given  Plan Discussed with: Surgeon, Anesthesiologist and CRNA  Anesthesia Plan Comments:        Anesthesia Quick Evaluation

## 2020-06-13 NOTE — H&P (Signed)
History and Physical    Curtis Jimenez XBL:[id number] DOB: February 12, 1936 DOA: 06/12/2020  PCP: Tammi Sou, MD Patient coming from: Home  Chief Complaint: Emesis, weight loss, cough  HPI: Curtis Jimenez is a 84 y.o. 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  Patient states for the past 3 weeks he has had difficulty swallowing solids and vomiting.  It feels as if food is getting stuck in his throat.  Sometimes his throat is painful when he tries to swallow but does not experience any chest pain with swallowing.  He is only able to consume 2 ounces of food at a time but has not had any difficulty swallowing water.  He is able to swallow most of his medications.  Reports having a lot of acid reflux.  States he has been on Bactrim for several years which was prescribed to him by his physician due to his history of renal transplant.  After the dose of his Bactrim was increased, he had worsening nausea and emesis.  As such, he has stopped taking it.  He has lost about 17 pounds in the past 1 month.  Also coughing for the past few weeks.  Denies fevers, shortness of breath, abdominal pain, or diarrhea.  ED Course: Afebrile.  Labs showing no leukocytosis.  Lipase and LFTs normal.  Bicarb 19, anion gap 11.  BUN 34, creatinine 2.0.  Creatinine was 1.5 in January 2021.  UA not suggestive of infection.  SARS-CoV-2 PCR test pending.  CT chest showing no acute intrathoracic pathology.  CT abdomen pelvis showing slightly hyperdense 3.7 cm lesion within the right native kidney which could represent a proteinaceous or hemorrhagic cyst, however, cannot exclude a solid renal lesion.  Ultrasound recommended for further evaluation.  Patient was given 1 L normal saline  bolus.  Review of Systems:  All systems reviewed and apart from history of presenting illness, are negative.  Past Medical History:  Diagnosis Date  . Anemia   . Basal cell carcinoma    neck (skin MD 2X per year)  . Bifascicular block 2021   ectopy, asymptomatic.  Cards->observe  . Bipolar disorder ([place])   . BPH (benign prostatic hyperplasia)    with elevated PSA; followed by Dr. Rosana Hoes at [facility]  . Chronic renal insufficiency, stage III (moderate)    in pt w/hx of renal transplant.  Post-transplant sCr 1.4-1.6.  Last renal f/u 05/01/19->Cr 1.63, GFR 39 ml/min  . Coronary atherosclerosis    LM and 3 V dz noted on CT 08/2019 performed for eval of interstitial lung dz in the setting of mild DOE and mild hypoxia. Cards->no stress tesing indicated->primary prevention emphasized  . CVA (cerebral vascular accident) ([place]) 11/2016   Pontine (vertebrobasilar--imaging neg), TPA given.  Pt discharged on plavix.  Carotid dopplers ok, echocardiogram ok.  Residual deficit: vertigo but this improved greatly with therapy.  . DOE (dyspnea on exertion) 2020   DOE and ? hypoxia->CXR 08/29/19--->diffuse interstitial opacities-? acute inflammatory process->Dr. Melvyn Novas eval'd him and was underwhelmed by the CXR and exam->CT chest 09/26/19 "Very subtle areas of mild ground-glass attenuatio-nonspecific", mild air trapping. Per Dr. Melvyn Novas, no signif ILD, improving with inc ambulation (post-inflamm pulm fibrosis?)  . Gallbladder polyp 2015   Asymptomatic  . GERD (gastroesophageal reflux disease)   . Gout   . History  of adenomatous polyp of colon 2018   Recall 5 yrs  . Hyperlipidemia   . Hypertension   . Hypothyroidism   . Lumbar spondylosis   . Ptosis due to aging    Right  . Rectal bleeding 09/2017   Admitted for obs due to Hb drop.  GI consulted---obs recommended.  No transfusion required.  Plavix was d/c'd, ferrous sulfate recommended.  Outpt GI f/u--plavix restarted and upper endoscopy was normal and  colonoscopy showed 2 polyps and severe diverticular dz. Iron d/c'd 04/24/19.  Marland Kitchen Restless legs syndrome   . S/p cadaver renal transplant 2013   Secondary to HTN +lithium toxicity over 30 yrs caused kidney destruction [facility] transplant MDs)  . Seborrheic dermatitis    eyebrows worst: Hytone rx'd by Dr. Denna Haggard.  . 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 AV FISTULA PLACEMENT  04/08/2012   Procedure: ARTERIOVENOUS (AV) FISTULA CREATION;  Surgeon: Angelia Mould, MD;  Location: [facility] OR;  Service: Vascular;  Laterality: Left;  Creation of left brachial cephalic arteriovenous fistula  . CATARACT EXTRACTION, BILATERAL  05/2018  . COLONOSCOPY  2013; 11/24/17   2013; Normal except diverticulosis (recall 10 yrs).  2018 (for GI bleeding)--severe diverticular dz + 2 adenomatous polyps.  Recall 5 yrs.  . DG CHEST AP OR PA ([facility] HX)  06/03/2020   Bibasilar atelectasis. No focal consoliadation definitively identified.  . dilation for GERD    . ESOPHAGOGASTRODUODENOSCOPY  11/24/2017   gastric polyps x 2, otherwise normal.  . KIDNEY TRANSPLANT  11/06/12   Bell (cadaveric)  . PROSTATE BIOPSY  2011   no malignancy  . TRANSTHORACIC ECHOCARDIOGRAM  11/2016   Normal LV systolic fxn, EF 27-51%.  Grade I DD.  Mild aortic root dilatation, mild MV regurg.     reports that he quit smoking about 46 years ago. His smoking use included pipe and cigars. He quit after 16.00 years of use. He has never used smokeless tobacco. He reports that he does not drink alcohol and does not use drugs.  Allergies  Allergen Reactions  . Amantadine Hcl Itching    REACTION: itching  . Chlorpromazine Hcl     REACTION: "fell flat on face"    Family History  Problem Relation Age of Onset  .  Ulcers Mother        GI bleed   . Heart disease Father   . Bladder Cancer Father   . Colon cancer Neg Hx   . Esophageal cancer Neg Hx   . Pancreatic cancer Neg Hx   . Rectal cancer Neg Hx   . Stomach cancer Neg Hx     Prior to Admission medications   Medication Sig Start Date End Date Taking? Authorizing Provider  acetaminophen (TYLENOL) 325 MG tablet Take 650 mg by mouth every 6 (six) hours as needed for mild pain.   Yes [provider]  allopurinol (ZYLOPRIM) 300 MG tablet Take 1 tablet by mouth once daily 05/10/20  Yes McGowen, Adrian Blackwater, MD  benzonatate (TESSALON) 200 MG capsule Take 1 capsule (200 mg total) by mouth 3 (three) times daily as needed for cough. 06/05/20  Yes McGowen, Adrian Blackwater, MD  carvedilol (COREG) 12.5 MG tablet 1/2 tab po qAM and 1 tab po qPM Patient taking differently: Take by  mouth See admin instructions. 1/2 tab po qAM and 1/2 tab po qPM 07/27/19  Yes McGowen, Adrian Blackwater, MD  clopidogrel (PLAVIX) 75 MG tablet Take 1 tablet by mouth once daily 05/10/20  Yes McGowen, Adrian Blackwater, MD  EUTHYROX 25 MCG tablet Take 1 tablet by mouth once daily 08/07/19  Yes McGowen, Adrian Blackwater, MD  hydrOXYzine (ATARAX/VISTARIL) 10 MG tablet Take 10 mg by mouth 2 (two) times daily as needed for itching or anxiety.    Yes [provider]  Melatonin 2.5 MG CAPS Take 2 capsules by mouth at bedtime as needed (sleep).    Yes [provider]  mycophenolate (MYFORTIC) 180 MG EC tablet Take 360 mg by mouth 2 (two) times daily.    Yes [provider]  omeprazole (PRILOSEC) 20 MG capsule Take 1 capsule by mouth once daily 09/27/19  Yes McGowen, Adrian Blackwater, MD  ondansetron (ZOFRAN ODT) 4 MG disintegrating tablet Take 1 tablet (4 mg total) by mouth every 8 (eight) hours as needed for nausea or vomiting. 06/08/20  Yes Fondaw, Wylder S, PA  risperiDONE (RISPERDAL) 1 MG tablet Take 1 mg by mouth at bedtime.    Yes [provider]  simvastatin (ZOCOR) 20 MG tablet Take 1 tablet  by mouth once daily 01/24/20  Yes McGowen, Adrian Blackwater, MD  sulfamethoxazole-trimethoprim (BACTRIM DS) 800-160 MG tablet 2 tabs po tid x 21 days Patient taking differently: Take 1 tablet by mouth daily.  06/05/20  Yes McGowen, Adrian Blackwater, MD  sulfamethoxazole-trimethoprim (BACTRIM,SEPTRA) 400-80 MG tablet Take 1 tablet by mouth every Monday, Wednesday, and Friday.   Yes [provider]  tacrolimus (PROGRAF) 0.5 MG capsule See admin instructions. 2 in the am and 1 at night    Yes [provider]  colchicine 0.6 MG tablet 2 tabs at onset of gout flare, then 1 tab one hour later, then starting the next day take 1 tab bid until gout flare is resolved Patient not taking: Reported on 06/05/2020 04/01/16   Tammi Sou, MD    Physical Exam: Vitals:   06/13/20 0046 06/13/20 0200 06/13/20 0400 06/13/20 0500  BP: 135/68 131/70 129/69 137/69  Pulse: 78 79 81 82  Resp: (!) 29 (!) 28 (!) 24 18  Temp:      SpO2: 95% 90% 92% 92%    Physical Exam Constitutional:      General: He is not in acute distress. HENT:     Head: Normocephalic.  Eyes:     Extraocular Movements: Extraocular movements intact.  Cardiovascular:     Rate and Rhythm: Normal rate and regular rhythm.     Pulses: Normal pulses.  Pulmonary:     Effort: Pulmonary effort is normal. No respiratory distress.     Breath sounds: Normal breath sounds. No wheezing or rales.  Abdominal:     General: Bowel sounds are normal. There is no distension.     Palpations: Abdomen is soft.     Tenderness: There is no abdominal tenderness. There is no guarding.  Musculoskeletal:        General: No swelling.     Cervical back: Normal range of motion and neck supple.  Skin:    General: Skin is warm and dry.  Neurological:     Mental Status: He is alert and oriented to person, place, and time.     Labs on Admission: I have personally reviewed following labs and imaging studies  CBC: Recent Labs  Lab 06/08/20 1535 06/11/20 1115  06/12/20  2256  WBC 7.2 6.2 6.6  NEUTROABS  --  4,836 4.6  HGB 14.4 14.3 14.1  HCT 45.2 45.5 44.0  MCV 88.3 88.7 87.6  PLT 210 226 485   Basic Metabolic Panel: Recent Labs  Lab 06/08/20 1535 06/11/20 1115 06/12/20 2256  NA 138 134* 135  K 4.7 4.8 4.7  CL 106 102 105  CO2 20* 19* 19*  GLUCOSE 106* 90 103*  BUN 32* 32* 34*  CREATININE 2.27* 2.18* 2.03*  CALCIUM 9.2 9.7 9.4   GFR: Estimated Creatinine Clearance: 28.5 mL/min (A) (by C-G formula based on SCr of 2.03 mg/dL (H)). Liver Function Tests: Recent Labs  Lab 06/12/20 2256  AST 22  ALT 19  ALKPHOS 49  BILITOT 0.9  PROT 6.4*  ALBUMIN 3.7   Recent Labs  Lab 06/12/20 2256  LIPASE 49   No results for input(s): AMMONIA in the last 168 hours. Coagulation Profile: No results for input(s): INR, PROTIME in the last 168 hours. Cardiac Enzymes: No results for input(s): CKTOTAL, CKMB, CKMBINDEX, TROPONINI in the last 168 hours. BNP (last 3 results) Recent Labs    08/29/19 1142  PROBNP 262.0*   HbA1C: No results for input(s): HGBA1C in the last 72 hours. CBG: No results for input(s): GLUCAP in the last 168 hours. Lipid Profile: No results for input(s): CHOL, HDL, LDLCALC, TRIG, CHOLHDL, LDLDIRECT in the last 72 hours. Thyroid Function Tests: No results for input(s): TSH, T4TOTAL, FREET4, T3FREE, THYROIDAB in the last 72 hours. Anemia Panel: No results for input(s): VITAMINB12, FOLATE, FERRITIN, TIBC, IRON, RETICCTPCT in the last 72 hours. Urine analysis:    Component Value Date/Time   COLORURINE YELLOW 06/12/2020 2300   APPEARANCEUR HAZY (A) 06/12/2020 2300   LABSPEC 1.013 06/12/2020 2300   PHURINE 5.0 06/12/2020 2300   GLUCOSEU NEGATIVE 06/12/2020 2300   HGBUR NEGATIVE 06/12/2020 2300   BILIRUBINUR NEGATIVE 06/12/2020 2300   BILIRUBINUR negative 02/09/2017 1510   KETONESUR 5 (A) 06/12/2020 2300   PROTEINUR NEGATIVE 06/12/2020 2300   UROBILINOGEN 0.2 02/09/2017 1510   NITRITE NEGATIVE 06/12/2020 2300    LEUKOCYTESUR NEGATIVE 06/12/2020 2300    Radiological Exams on Admission: CT ABDOMEN PELVIS WO CONTRAST  Result Date: 06/13/2020 CLINICAL DATA:  Nausea vomiting weight loss over the last 3 weeks EXAM: CT CHEST WITHOUT CONTRAST TECHNIQUE: Multidetector CT imaging of the chest was performed following the standard protocol without IV contrast. COMPARISON:  September 26, 2019 FINDINGS: Cardiovascular: Coronary artery calcifications are seen. There is scattered aortic atherosclerosis noted. The heart size is normal. There is no pericardial thickening or effusion. Mediastinum/Nodes: No definite enlarged mediastinal adenopathy, however limited due to the lack of intravenous contrast. The thyroid gland, trachea and esophagus demonstrate no significant findings. Lungs/Pleura: Again noted is minimal ground-glass opacity at the posterior right lung base, likely atelectasis or scarring. No suspicious pulmonary nodules. There is no pleural effusion. Upper abdomen: The visualized portion of the upper abdomen is unremarkable. Musculoskeletal/Chest wall: There is no chest wall mass or suspicious osseous finding. No acute osseous abnormality Abdomen/pelvis: Hepatobiliary: Although limited due to the lack of intravenous contrast, normal in appearance without gross focal abnormality. No evidence of calcified gallstones or biliary ductal dilatation. Pancreas:  Unremarkable.  No surrounding inflammatory changes. Spleen: Normal in size. Although limited due to the lack of intravenous contrast, normal in appearance. Adrenals/Urinary Tract: Both adrenal glands appear normal. There is renal atrophy of the bilateral native kidneys. Within the upper pole of the right kidney there is a slightly hyperdense  lesion measuring 3.7 cm. Throughout both kidneys are low-density lesions. A right lower pelvic renal transplant is present. There is a 3 cm low-density lesion within the renal transplant, likely renal cyst. No hydronephrosis is seen.  The bladder is unremarkable. Stomach/Bowel: The stomach and small bowel are normal in appearance. There is scattered colonic diverticula without diverticulitis. A moderate to large amount of colonic stool is present. Vascular/Lymphatic: There are no enlarged abdominal or pelvic lymph nodes. Scattered aortic atherosclerotic calcifications are seen without aneurysmal dilatation. Reproductive: A heterogeneously enlarged prostate gland is noted which protrudes into the posterior bladder. Other: No evidence of abdominal wall mass or hernia. Musculoskeletal: No acute or significant osseous findings. Degenerative changes are seen throughout the thoracolumbar spine. Anterior flowing osteophytes are seen in the lower thoracic spine. IMPRESSION: 1. No acute intrathoracic pathology to explain the patient's symptoms. 2. Coronary artery calcifications. 3. Slightly hyperdense 3.7 cm lesion within the right native kidney which could represent a proteinaceous or hemorrhagic cyst, however cannot exclude a solid renal lesion. Would recommend renal ultrasound for further evaluation. 4. Diverticulosis without diverticulitis. 5.  Aortic Atherosclerosis (ICD10-I70.0). 6. Prostatomegaly Electronically Signed   By: Prudencio Pair M.D.   On: 06/13/2020 00:22   CT Chest Wo Contrast  Result Date: 06/13/2020 CLINICAL DATA:  Nausea vomiting weight loss over the last 3 weeks EXAM: CT CHEST WITHOUT CONTRAST TECHNIQUE: Multidetector CT imaging of the chest was performed following the standard protocol without IV contrast. COMPARISON:  September 26, 2019 FINDINGS: Cardiovascular: Coronary artery calcifications are seen. There is scattered aortic atherosclerosis noted. The heart size is normal. There is no pericardial thickening or effusion. Mediastinum/Nodes: No definite enlarged mediastinal adenopathy, however limited due to the lack of intravenous contrast. The thyroid gland, trachea and esophagus demonstrate no significant findings.  Lungs/Pleura: Again noted is minimal ground-glass opacity at the posterior right lung base, likely atelectasis or scarring. No suspicious pulmonary nodules. There is no pleural effusion. Upper abdomen: The visualized portion of the upper abdomen is unremarkable. Musculoskeletal/Chest wall: There is no chest wall mass or suspicious osseous finding. No acute osseous abnormality Abdomen/pelvis: Hepatobiliary: Although limited due to the lack of intravenous contrast, normal in appearance without gross focal abnormality. No evidence of calcified gallstones or biliary ductal dilatation. Pancreas:  Unremarkable.  No surrounding inflammatory changes. Spleen: Normal in size. Although limited due to the lack of intravenous contrast, normal in appearance. Adrenals/Urinary Tract: Both adrenal glands appear normal. There is renal atrophy of the bilateral native kidneys. Within the upper pole of the right kidney there is a slightly hyperdense lesion measuring 3.7 cm. Throughout both kidneys are low-density lesions. A right lower pelvic renal transplant is present. There is a 3 cm low-density lesion within the renal transplant, likely renal cyst. No hydronephrosis is seen. The bladder is unremarkable. Stomach/Bowel: The stomach and small bowel are normal in appearance. There is scattered colonic diverticula without diverticulitis. A moderate to large amount of colonic stool is present. Vascular/Lymphatic: There are no enlarged abdominal or pelvic lymph nodes. Scattered aortic atherosclerotic calcifications are seen without aneurysmal dilatation. Reproductive: A heterogeneously enlarged prostate gland is noted which protrudes into the posterior bladder. Other: No evidence of abdominal wall mass or hernia. Musculoskeletal: No acute or significant osseous findings. Degenerative changes are seen throughout the thoracolumbar spine. Anterior flowing osteophytes are seen in the lower thoracic spine. IMPRESSION: 1. No acute intrathoracic  pathology to explain the patient's symptoms. 2. Coronary artery calcifications. 3. Slightly hyperdense 3.7 cm lesion within the right native  kidney which could represent a proteinaceous or hemorrhagic cyst, however cannot exclude a solid renal lesion. Would recommend renal ultrasound for further evaluation. 4. Diverticulosis without diverticulitis. 5.  Aortic Atherosclerosis (ICD10-I70.0). 6. Prostatomegaly Electronically Signed   By: Prudencio Pair M.D.   On: 06/13/2020 00:22    EKG: Independently reviewed.  Sinus rhythm, LAFB, and RBBB.  No significant change since prior tracing.  Assessment/Plan Principal Problem:   Emesis Active Problems:   Weight loss   Cough   AKI (acute kidney injury) ([place])   Metabolic acidosis   Emesis and weight loss: Afebrile and no leukocytosis.  Lipase and LFTs normal.  CT with lesion concerning for cyst versus solid renal lesion, however, no other findings to explain the patient's current symptoms.  Does report dysphagia and very poor oral intake.  Only able to continue 2 ounces of food at a time.  This would explain his significant weight loss.  Per chart review, he has lost approximately 15 pounds in the past 1 month. -Keep n.p.o. and order barium swallow/SLP eval. Consult GI in a.m. for possible EGD. -Antiemetic as needed -Does endorse uncontrolled GERD, order IV PPI  Cough: Likely related to GERD.  Chest CT negative for acute finding.  Not on an ACE inhibitor. -Antitussive as needed  AKI in the setting of CKD s/p history of renal transplant in 2013: Creatinine up to 2.0 and was previously 1.5 in January 2021.  Likely prerenal due to dehydration in the setting of dysphagia/very poor oral intake. -IV fluid hydration.  Monitor renal function and urine output.  Renal ultrasound ordered.  Avoid nephrotoxic agents/contrast.  Continue mycophenolate and tacrolimus.  He is on Bactrim for long-term PCP prophylaxis but has not been able to take it due to worsening  nausea/emesis.  Mild normal anion gap metabolic acidosis: Bicarb 19, anion gap 11.  Likely related to AKI. -IV fluid hydration and repeat BMP in a.m.  Renal lesion on CT: CT abdomen pelvis showing slightly hyperdense 3.7 cm lesion within the right native kidney which could represent a proteinaceous or hemorrhagic cyst, however, cannot exclude a solid renal lesion.   -Ultrasound has been ordered for further evaluation  History of CVA -Continue Plavix and statin  Hypertension: Currently normotensive. -Continue home Coreg  Hyperlipidemia -Continue statin  Hypothyroidism -Continue home Synthroid  DVT prophylaxis: Subcutaneous heparin Code Status: Patient wishes to be full code. Family Communication: Wife at bedside. Disposition Plan: Status is: Observation  The patient remains OBS appropriate and will d/c before 2 midnights.  Dispo: The patient is from: Home              Anticipated d/c is to: Home              Anticipated d/c date is: 2 days              Patient currently is not medically stable to d/c.  The medical decision making on this patient was of high complexity and the patient is at high risk for clinical deterioration, therefore this is a level 3 visit.  Shela Leff MD [facility]  If 7PM-7AM, please contact night-coverage [email or web address]  06/13/2020, 5:31 AM

## 2020-06-13 NOTE — Progress Notes (Signed)
Care started prior to midnight in the emergency room and patient is admitted early this morning after midnight and I am in current agreement with the assessment and plan done by Dr. Shela Leff.  Additional changes to the plan of care have been made accordingly.  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  Patient states for the last 3 weeks he has had difficulty swallowing solids and liquids and has been vomiting anything that he takes.  He states if he feels the food is getting stuck in his throat.  He stated that it is initially painful to swallow on the right side but did not experience any chest pain with swallowing.  Recently he has been able to consume some water now and drinks at least 2 ounces water at a time.  He reports having a lot of acid reflux.  He was recently on Bactrim for several years which was prescribed to him by his physician due to his history of renal transplant and after his dose was increased he had worsening nausea and emesis and he stopped taking it.  He has lost about 17 pounds in the last month and is been coughing for last few weeks.  Further work-up in the ED was done with a chest CT of the abdomen pelvis.  CT of the chest showed no acute intrathoracic pathology but a CT of the abdomen pelvis showed a hypodense 3.7 cm lesion with in the right native kidney which could represent a proteinaceous or hemorrhagic cyst.  Ultrasound was recommended and this was done and it was a nonvascular cyst and no hydronephrosis.  Medical renal disease was also noted.  He is being treated for admitted for the following but not limited  to:  Emesis and weight loss:  -Afebrile and no leukocytosis.  Lipase and LFTs normal.  CT with lesion concerning for cyst versus solid renal lesion, however, no other findings to explain the patient's current symptoms.  Does report dysphagia and very poor oral intake.   -Only able to continue 2 ounces of liquid at a time.  This would explain his significant weight loss.  Per chart review, he has lost approximately 15 pounds in the past 1 month. -Keep n.p.o. and order barium swallow/SLP eval. Consulted GI today and they are likely going to pursue an EGD and he has been put on clear liquids -Antiemetic as needed -Does endorse uncontrolled GERD, order IV PPI -Follow-up on GI recommendations  Cough: -Likely related to GERD.  Chest CT negative for acute finding.  Not on an ACE inhibitor. -Antitussive as needed  AKI in the setting of CKD s/p history of renal transplant in 2013:  -Creatinine up to 2.0 and was previously 1.5 in January 2021.  Likely prerenal due to dehydration in the setting of dysphagia/very poor oral intake. -IV fluid hydration.  Monitor renal function and urine output.  Renal ultrasound ordered.  Avoid nephrotoxic agents/contrast.  Continue mycophenolate and tacrolimus.  He is on Bactrim for long-term PCP prophylaxis but has not been able to take it due to worsening nausea/emesis. -Monitor carefully and may need nephrology consultation  Mild normal anion gap metabolic acidosis:  -Bicarb 19, anion gap 11.  Likely  related to AKI. -IV fluid hydration and repeat BMP in a.m.  Renal lesion on CT:  -CT abdomen pelvis showing slightly hyperdense 3.7 cm lesion within the right native kidney which could represent a proteinaceous or hemorrhagic cyst, however, cannot exclude a solid renal lesion.   -Ultrasound has been ordered for further evaluation and showed a nonvascular cyst.  There is also by 9 cyst and no hydronephrosis noted  History of CVA -Continue Plavix and statin however  because he will get an EGD diagonal hold his Plavix  Hypertension:  -Currently normotensive. -Continue home Coreg  Hyperlipidemia -Continue statin  Hypothyroidism -Continue home Synthroid  We will continue to monitor the patient's clinical response to intervention and repeat blood work in the a.m. follow-up on specialist recommendations.

## 2020-06-14 ENCOUNTER — Inpatient Hospital Stay (HOSPITAL_COMMUNITY): Payer: Medicare HMO | Admitting: Anesthesiology

## 2020-06-14 ENCOUNTER — Encounter (HOSPITAL_COMMUNITY): Payer: Self-pay | Admitting: Internal Medicine

## 2020-06-14 ENCOUNTER — Encounter (HOSPITAL_COMMUNITY)
Admission: AD | Disposition: A | Discharge status: Home / Self Care | Payer: Self-pay | Source: Ambulatory Visit | Attending: Internal Medicine

## 2020-06-14 DIAGNOSIS — K2100 Gastro-esophageal reflux disease with esophagitis, without bleeding: Secondary | ICD-10-CM

## 2020-06-14 DIAGNOSIS — K209 Esophagitis, unspecified without bleeding: Secondary | ICD-10-CM

## 2020-06-14 DIAGNOSIS — K317 Polyp of stomach and duodenum: Secondary | ICD-10-CM

## 2020-06-14 DIAGNOSIS — K222 Esophageal obstruction: Secondary | ICD-10-CM

## 2020-06-14 HISTORY — DX: Esophagitis, unspecified without bleeding: K20.90

## 2020-06-14 HISTORY — PX: ESOPHAGOGASTRODUODENOSCOPY: SHX5428

## 2020-06-14 HISTORY — PX: BIOPSY: SHX5522

## 2020-06-14 LAB — BASIC METABOLIC PANEL
Anion gap: 7 (ref 5–15)
BUN: 23 mg/dL (ref 8–23)
CO2: 20 mmol/L — ABNORMAL LOW (ref 22–32)
Calcium: 8.6 mg/dL — ABNORMAL LOW (ref 8.9–10.3)
Chloride: 108 mmol/L (ref 98–111)
Creatinine, Ser: 1.54 mg/dL — ABNORMAL HIGH (ref 0.61–1.24)
GFR calc Af Amer: 48 mL/min — ABNORMAL LOW (ref >60)
GFR calc non Af Amer: 42 mL/min — ABNORMAL LOW (ref >60)
Glucose, Bld: 83 mg/dL (ref 70–99)
Potassium: 4.1 mmol/L (ref 3.5–5.1)
Sodium: 135 mmol/L (ref 135–145)

## 2020-06-14 LAB — CBC
HCT: 40 % (ref 39.0–52.0)
Hemoglobin: 12.9 g/dL — ABNORMAL LOW (ref 13.0–17.0)
MCH: 28.2 pg (ref 26.0–34.0)
MCHC: 32.3 g/dL (ref 30.0–36.0)
MCV: 87.3 fL (ref 80.0–100.0)
Platelets: 192 10*3/uL (ref 150–400)
RBC: 4.58 MIL/uL (ref 4.22–5.81)
RDW: 13.4 % (ref 11.5–15.5)
WBC: 5.1 10*3/uL (ref 4.0–10.5)
nRBC: 0 % (ref 0.0–0.2)

## 2020-06-14 SURGERY — EGD (ESOPHAGOGASTRODUODENOSCOPY)
Anesthesia: Monitor Anesthesia Care

## 2020-06-14 MED ORDER — CLOPIDOGREL BISULFATE 75 MG PO TABS: 75.0000 mg | ORAL_TABLET | Freq: Every day | ORAL | 0 refills | Status: DC

## 2020-06-14 MED ORDER — PROPOFOL 10 MG/ML IV BOLUS
INTRAVENOUS | Status: DC | PRN
  Administered 2020-06-14: 40 mg via INTRAVENOUS
  Administered 2020-06-14: 20 mg via INTRAVENOUS

## 2020-06-14 MED ORDER — PANTOPRAZOLE SODIUM 40 MG PO TBEC: 40.0000 mg | DELAYED_RELEASE_TABLET | Freq: Two times a day (BID) | ORAL | 0 refills | Status: DC

## 2020-06-14 MED ORDER — PROPOFOL 500 MG/50ML IV EMUL
INTRAVENOUS | Status: AC
  Filled 2020-06-14: qty 50

## 2020-06-14 MED ORDER — SODIUM CHLORIDE 0.9 % IV SOLN
INTRAVENOUS | Status: DC
  Administered 2020-06-14: 500 mL via INTRAVENOUS

## 2020-06-14 MED ORDER — PANTOPRAZOLE SODIUM 40 MG PO TBEC
40.0000 mg | DELAYED_RELEASE_TABLET | Freq: Two times a day (BID) | ORAL | Status: DC
  Administered 2020-06-14: 40 mg via ORAL
  Filled 2020-06-14: qty 1

## 2020-06-14 MED ORDER — PROPOFOL 500 MG/50ML IV EMUL
INTRAVENOUS | Status: DC | PRN
  Administered 2020-06-14: 100 ug/kg/min via INTRAVENOUS

## 2020-06-14 MED ORDER — LACTATED RINGERS IV SOLN: INTRAVENOUS | Status: DC | PRN

## 2020-06-14 NOTE — Evaluation (Signed)
SLP Cancellation Note  Patient Details Name: Curtis Jimenez MRN: [id number] DOB: 1936/06/22   Cancelled treatment:         discharge order written and GI managed dysphagia, if f/u needed from SLP, please order OP. Thanks.   Macario Golds 06/14/2020, 4:58 PM

## 2020-06-14 NOTE — Progress Notes (Signed)
Addendum to GI consult completed on 06/14/2020 Physical exam findings: Heart: RRR, no murmurs Lungs: Clear throughout. Abdomen: Soft, nondistended, nontender. Positive BS x 4 quads, no HSM.  [place]

## 2020-06-14 NOTE — Op Note (Signed)
[facility] Patient Name: Curtis Jimenez Procedure Date: 06/14/2020 MRN: [id number] Attending MD: Gerrit Heck , MD Date of Birth: Jan 23, 1936 CSN: [id number] Age: 84 Admit Type: Outpatient Procedure:                Upper GI endoscopy Indications:              Dysphagia, Esophageal reflux, Nausea with vomiting,                            Weight loss Providers:                Gerrit Heck, MD, Cleda Daub, RN, Elspeth Cho Tech., Technician, Anne Fu                            CRNA, CRNA Referring MD:              Medicines:                Monitored Anesthesia Care Complications:            No immediate complications. Estimated Blood Loss:     Estimated blood loss was minimal. Procedure:                Pre-Anesthesia Assessment:                           - Prior to the procedure, a History and Physical                            was performed, and patient medications and                            allergies were reviewed. [name] tolerance of                            previous anesthesia was also reviewed. The risks                            and benefits of the procedure and the sedation                            options and risks were discussed with the patient.                            All questions were answered, and informed consent                            was obtained. Prior Anticoagulants: The patient has                            taken Plavix (clopidogrel), last dose was 2 days                            prior to  procedure. ASA Grade Assessment: III - A                            patient with severe systemic disease. After                            reviewing the risks and benefits, the patient was                            deemed in satisfactory condition to undergo the                            procedure.                           After obtaining informed consent, the endoscope was                             passed under direct vision. Throughout the                            procedure, the patient's blood pressure, pulse, and                            oxygen saturations were monitored continuously. The                            GIF-H190 ([id number]) Olympus gastroscope was                            introduced through the mouth, and advanced to the                            second part of duodenum. The upper GI endoscopy was                            accomplished without difficulty. The patient                            tolerated the procedure well. Scope In: Scope Out: Findings:      LA Grade A (one or more mucosal breaks less than 5 mm, not extending       between tops of 2 mucosal folds) esophagitis with no bleeding was found       in the lower third of the esophagus.      One benign-appearing, intrinsic mild stenosis was found 40 cm from the       incisors. This stenosis measured less than one cm (in length). The       stenosis was traversed. Given the appearance and active Plavix, this was       not dilated today in favor of cold forceps for fracturing of the ring.       Estimated blood loss was minimal.      Two areas of ectopic gastric mucosa were found in the upper third of the       esophagus, 18 cm  from the incisors.      Multiple 2 to 6 mm sessile polyps with no bleeding and no stigmata of       recent bleeding were found in the gastric fundus and in the gastric       body. Several of these polyps were removed with a cold biopsy forceps.       Resection and retrieval were complete. Estimated blood loss was minimal.      The incisura, gastric antrum and pylorus were normal.      The ampulla, duodenal bulb, first portion of the duodenum and second       portion of the duodenum were normal. Impression:               - LA Grade A reflux esophagitis with no bleeding.                           - Benign-appearing esophageal stenosis. Biopsied.                            - Ectopic gastric mucosa in the upper third of the                            esophagus.                           - Multiple gastric polyps. Resected and retrieved.                           - Normal incisura, antrum and pylorus.                           - Normal ampulla, duodenal bulb, first portion of                            the duodenum and second portion of the duodenum. Moderate Sedation:      Not Applicable - Patient had care per Anesthesia. Recommendation:           - Return patient to hospital ward for possible                            discharge same day.                           - Soft diet today and can advance slowly as                            tolerated.                           - Continue present medications.                           - Continue Protonix (pantoprazole) 40 mg PO BID for                            8 weeks, then reduce to 40 mg daily.                           -  Await pathology results.                           - Repeat upper endoscopy PRN for retreatment. If                            ongoing dysphagia, plan for 5 day Plavix hold and                            can perform esophageal dilation at that time.                           - Return to GI office at appointment to be                            scheduled. Procedure Code(s):        --- Professional ---                           [phone], Esophagogastroduodenoscopy, flexible,                            transoral; with biopsy, single or multiple Diagnosis Code(s):        --- Professional ---                           K21.00, Gastro-esophageal reflux disease with                            esophagitis, without bleeding                           K22.2, Esophageal obstruction                           Q40.2, Other specified congenital malformations of                            stomach                           K31.7, Polyp of stomach and duodenum                           R13.10, Dysphagia,  unspecified                           R11.2, Nausea with vomiting, unspecified                           R63.4, Abnormal weight loss CPT copyright 2019 [employer]. All rights reserved. The codes documented in this report are preliminary and upon coder review may  be revised to meet current compliance requirements. Gerrit Heck, MD 06/14/2020 9:52:38 AM Number of Addenda: 0

## 2020-06-14 NOTE — Progress Notes (Signed)
EGD completed earlier this morning.  Please see separate endoscopy report for full details.  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  Several of the polyps were removed with cold forceps.  Since last dose of Plavix was just 2 days ago, esophageal dilation was not performed.  However, able to fracture the peptic stricture using cold forceps.  EGD also notable for 2 gastric inlet patch areas in the proximal esophagus.  One was completely smooth, and the smaller one with a subtle area of irritation.  However, given the very proximal location, actively circulating Plavix, this was not biopsied today, in favor of medical management.  No areas suggestive of esophageal infection.  I discussed the results with the patient, along with his wife over the phone.  -Will continue with high-dose PPI -Okay to resume soft foods now and continue to advance as tolerated -If symptoms should persist as an outpatient, can plan on repeat EGD with 5-day Plavix hold to allow for both esophageal balloon dilation along with potential biopsy of the gastric inlet patch as appropriate.  If unrevealing/unresponsive, could consider esophageal manometry and/or CT neck -Should be okay for discharge later today provided he is tolerating p.o. intake.  Will coordinate GI follow-up

## 2020-06-14 NOTE — Transfer of Care (Signed)
Immediate Anesthesia Transfer of Care Note  Patient: Curtis Jimenez  Procedure(s) Performed: Procedure(s) with comments: ESOPHAGOGASTRODUODENOSCOPY (EGD) (N/A) - Diagnostic EGD as patietn last took Plavix 11am on 06/12/2020 BIOPSY  Patient Location: PACU  Anesthesia Type:MAC  Level of Consciousness:  sedated, patient cooperative and responds to stimulation  Airway & Oxygen Therapy:Patient Spontanous Breathing and Patient connected to face mask oxgen  Post-op Assessment:  Report given to PACU RN and Post -op Vital signs reviewed and stable  Post vital signs:  Reviewed and stable  Last Vitals:  Vitals:   06/14/20 0804 06/14/20 0949  BP: (!) 150/66   Pulse: 81 75  Resp: (!) 26 (!) 26  Temp: 37.1 C   SpO2: 59% 93%    Complications: No apparent anesthesia complications

## 2020-06-14 NOTE — Interval H&P Note (Signed)
History and Physical Interval Note:  06/14/2020 9:20 AM  Curtis Jimenez  has presented today for surgery, with the diagnosis of dysphagia.  The various methods of treatment have been discussed with the patient and family. After consideration of risks, benefits and other options for treatment, the patient has consented to  Procedure(s) with comments: ESOPHAGOGASTRODUODENOSCOPY (EGD) (N/A) - Diagnostic EGD as patietn last took Plavix 11am on 06/12/2020 as a surgical intervention.  [name] history has been reviewed, patient examined, no change in status, stable for surgery.  I have reviewed the patient's chart and labs.  Questions were answered to the patient's satisfaction.     Dominic Pea Zamari Vea

## 2020-06-14 NOTE — Evaluation (Addendum)
Physical Therapy Evaluation Patient Details Name: Curtis Jimenez MRN: [id number] DOB: 03-08-1936 Today's Date: 06/14/2020   History of Present Illness  84 y.o. 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  Patient states for the past 3 weeks he has had difficulty swallowing solids and vomiting.  It feels as if food is getting stuck in his throat.  Sometimes his throat is painful when he tries to swallow but does not experience any chest pain with swallowing.  He is only able to consume 2 ounces of food at a time but has not had any difficulty swallowing water.  He is able to swallow most of his medications.  Reports having a lot of acid reflux.  Clinical Impression  Pt admitted with above diagnosis. Pt demonstrates slight unsteadiness with ambulation in hallway without AD. Pt reports this is slightly below baseline, with weight loss over the past few weeks has noticed some weakening and balance deficits, but denies falls. Pt and spouse prefer OPPT for strengthening and balance training to improve ambulation without need for AD and decrease risk for falls. Pt currently with functional limitations due to the deficits listed below (see PT Problem List). Pt will benefit from skilled PT to increase their independence and safety with mobility to allow discharge to the venue listed below.        Follow Up Recommendations Outpatient PT    Equipment Recommendations  None recommended by PT    Recommendations for Other Services       Precautions / Restrictions Precautions Precautions: None Restrictions Weight Bearing Restrictions: No      Mobility  Bed Mobility Overal bed mobility: Modified Independent  General bed mobility comments: slightly increased  time  Transfers Overall transfer level: Needs assistance Equipment used: None Transfers: Sit to/from Stand Sit to Stand: Modified independent (Device/Increase time)  General transfer comment: BUE assisting to rise, good steadiness upon rising  Ambulation/Gait Ambulation/Gait assistance: Supervision Gait Distance (Feet): 150 Feet Assistive device: None Gait Pattern/deviations: Step-through pattern;Decreased stride length;Narrow base of support;Drifts right/left Gait velocity: WNL   General Gait Details: narrow BOS, occasional drifting R/L but maintains steadiness, stiff trunk and decreased bil arm swing, no loss of balance and no physical assistance  Stairs            Wheelchair Mobility    Modified Rankin (Stroke Patients Only)       Balance Overall balance assessment: Needs assistance Sitting-balance support: Feet supported;Bilateral upper extremity supported Sitting balance-Leahy Scale: Good Sitting balance - Comments: seated EOB   Standing balance support: During functional activity;No upper extremity supported Standing balance-Leahy Scale: Good Standing balance comment: no AD, slightly unsteady gait with drifting R/L but no loss of balance or physical assist        Pertinent Vitals/Pain Pain Assessment: No/denies pain    Home Living Family/patient expects to be discharged to:: Private residence Living Arrangements: Spouse/significant other Available Help at Discharge: Family;Available 24 hours/day Type of Home: House Home Access: Level entry     Home Layout: One level Home Equipment: Cane - single point;Walker - 2 wheels;Shower seat - built in      Prior Function Level of Independence: Independent         Comments: Pt reports ind with ADLs, drives, community ambulator without AD.     Hand Dominance  Dominant Hand: Right    Extremity/Trunk Assessment   Upper Extremity Assessment Upper Extremity Assessment: Defer to OT evaluation     Lower Extremity Assessment Lower Extremity Assessment: Overall WFL for tasks assessed (symmetrical, AROM WNL, strength 4/5, denies numbness/tingling)    Cervical / Trunk Assessment Cervical / Trunk Assessment: Normal  Communication   Communication: No difficulties  Cognition Arousal/Alertness: Awake/alert Behavior During Therapy: WFL for tasks assessed/performed Overall Cognitive Status: Within Functional Limits for tasks assessed               General Comments      Exercises     Assessment/Plan    PT Assessment All further PT needs can be met in the next venue of care  PT Problem List Decreased activity tolerance;Decreased balance       PT Treatment Interventions      PT Goals (Current goals can be found in the Care Plan section)  Acute Rehab PT Goals Patient Stated Goal: OPPT for balance PT Goal Formulation: With patient/family Time For Goal Achievement: 06/21/20 Potential to Achieve Goals: Good    Frequency     Barriers to discharge        Co-evaluation               AM-PAC PT "6 Clicks" Mobility  Outcome Measure Help needed turning from your back to your side while in a flat bed without using bedrails?: None Help needed moving from lying on your back to sitting on the side of a flat bed without using bedrails?: None Help needed moving to and from a bed to a chair (including a wheelchair)?: None Help needed standing up from a chair using your arms (e.g., wheelchair or bedside chair)?: None Help needed to walk in hospital room?: A Little Help needed climbing 3-5 steps with a railing? : A Little 6 Click Score: 22    End of Session Equipment Utilized During Treatment: Gait belt Activity Tolerance: Patient tolerated treatment well Patient left: in bed;with call bell/phone within reach;with nursing/sitter in room;with family/visitor present Nurse Communication: Mobility status PT Visit Diagnosis: Other abnormalities of gait and mobility (R26.89)     Time: 9892-1194 PT Time Calculation (min) (ACUTE ONLY): 15 min   Charges:   PT Evaluation $PT Eval Low Complexity: 1 Low           Tori Channell Quattrone PT, DPT 06/14/20, 3:10 PM

## 2020-06-14 NOTE — Discharge Summary (Signed)
Physician Discharge Summary  Curtis Jimenez TSV:[id number] DOB: 11/14/1936 DOA: 06/12/2020  PCP: Tammi Sou, MD  Admit date: 06/12/2020 Discharge date: 06/14/2020  Admitted From: Home Disposition: Home with Outpatient PT  Recommendations for Outpatient Follow-up:  1. Follow up with PCP in 1-2 weeks 2. Follow up with Gastroenterology as an outpatient  3. Please obtain CMP/CBC, Phos, Mag in one week 4. 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  Patient states for the last 3 weeks he has had difficulty swallowing solids and liquids and has been vomiting anything that he takes.  He states if he feels the food is getting stuck in his throat.  He stated that it is initially painful to swallow on the right side but did not experience any chest pain with swallowing.  Recently he has been able to consume some water now and drinks at least 2 ounces water at a time.  He reports having a lot of acid reflux.  He was recently on Bactrim for several years which was prescribed to him by his physician due to his history of renal transplant and after his dose was increased he had worsening nausea and emesis and he stopped taking it.  He has lost about 17 pounds in the last month and is been coughing for last few weeks.  Further work-up in the ED was done with a chest CT of the abdomen pelvis.  CT of the chest showed no acute intrathoracic  pathology but a CT of the abdomen pelvis showed a hypodense 3.7 cm lesion with in the right native kidney which could represent a proteinaceous or hemorrhagic cyst.  Ultrasound was recommended and this was done and it was a nonvascular cyst and no hydronephrosis.  Medical renal disease was also noted.   **Interim History EGD this morning and it showed an nonobstructive peptic stricture in the distal esophagus that was broken up with cold forceps.  There was also LA grade a esophagitis, benign-appearing fundic gland polyps which were removed with cold forceps.  Dr. Bryan Lemma recommended continue the patient on high-dose PPI and advancing diet to a soft diet and discharging home if he tolerated this without issues.  Patient was able to tolerate this and he will resume his Plavix and if necessary can have his Plavix held in the outpatient setting for 5 days prior to having a balloon dilatation EGD as an outpatient.  Patient is stable to be discharged at this time and prior to discharge PT OT evaluated recommending home health patient elected to go for outpatient PT so ambulatory referral is made.  Patient is stable to be discharged with PCP and gastroenterology in outpatient setting.  Discharge Diagnoses:  Principal Problem:   Emesis Active Problems:   Weight loss   Cough   AKI (acute kidney injury) ([place])   Metabolic acidosis   Peptic stricture of esophagus  Emesis and weight loss in the setting of his peptic nonobstructive stricture in the distal esophagus -Afebrile and no leukocytosis. Lipase and LFTs  normal. CT with lesion concerning for cyst versus solid renal lesion, however, noother findings to explain the patient's current symptoms.Does report dysphagia and very poor oral intake.  -Only able to continue 2 ounces of liquid at a time. This would explain his significant weight loss. Per chart review, he has lost approximately 15 pounds in the past 1 month. -Keep n.p.o. and order barium  swallow/SLP eval. Consulted GI today and they are likely going to pursue an EGD and he has been put on clear liquids  -EGD done this morning and please see the report below but it showed LA grade a esophagitis, benign-appearing fundic gland polyps as well as nonobstructive peptic stricture and his distal esophagus is broken up with cold forceps -Antiemetic as needed -Does endorse uncontrolled GERD,order IV PPI and this was changed to PPI twice daily p.o. -Follow-up on GI recommendations and they recommended advancing the diet to soft diet and if patient tolerated this without issues can be discharged home-PT OT recommending home health with patient elects for outpatient PT  Cough: -Likely related to GERD.Chest CT negative for acute finding. Not on an ACE inhibitor. -Antitussive as needed -Improving  AKIin the setting ofCKD s/phistory ofrenal transplant in 2013: -Creatinine up to 2.0 and was previously 1.5 in January 2021. Likely prerenal due to dehydration in the setting of dysphagia/very poor oral intake. -IV fluid hydration. Monitor renal function and urine output. Renal ultrasound ordered. Avoid nephrotoxic agents/contrast.Continue mycophenolate and tacrolimus. He is on Bactrim for long-term PCP prophylaxis but has not been able to take it due to worsening nausea/emesis. -Monitor carefully and may need nephrology consultation -Patient BUN/creatinine improved and is now 23/1.54  Mild normal anion gap metabolic acidosis, stable -Bicarb 19, anion gap 11. Likely related to AKI.;  Repeat this AM showed a CO2 of 20 and a anion gap of 7, and a chloride level 108 -IV fluid hydration and repeat BMP this a.m. was improved  Renal lesion on CT:  -CT abdomen pelvis showing slightly hyperdense 3.7 cm lesion within the right native kidney which could represent a proteinaceous or hemorrhagic cyst, however, cannot exclude a solid renal lesion.  -Ultrasound has been ordered for further  evaluation and showed a nonvascular cyst.  There is also by 9 cyst and no hydronephrosis noted  History of CVA -Continue Plavix tomorrow and statin however because he will get an EGD and GI recommending holding for 5 days in outpatient setting if he needs repeat EGD with dilatation  Hypertension:  -Currently normotensive. -Continue home Coreg  Hyperlipidemia -Continue statin  Hypothyroidism -Continue home Synthroid  Discharge Instructions  Discharge Instructions    Ambulatory referral to Physical Therapy   Complete by: As directed    Call MD for:  difficulty breathing, headache or visual disturbances   Complete by: As directed    Call MD for:  extreme fatigue   Complete by: As directed    Call MD for:  hives   Complete by: As directed    Call MD for:  persistant dizziness or light-headedness   Complete by: As directed    Call MD for:  persistant nausea and vomiting   Complete by: As directed    Call MD for:  redness, tenderness, or signs of infection (pain, swelling, redness, odor or green/yellow discharge around incision site)   Complete by: As directed    Call MD for:  severe uncontrolled pain   Complete by: As directed    Call MD for:  temperature >100.4   Complete  by: As directed    Diet - low sodium heart healthy   Complete by: As directed    Discharge instructions   Complete by: As directed    You were cared for by a hospitalist during your hospital stay. If you have any questions about your discharge medications or the care you received while you were in the hospital after you are discharged, you can call the unit and ask to speak with the hospitalist on call if the hospitalist that took care of you is not available. Once you are discharged, your primary care physician will handle any further medical issues. Please note that NO REFILLS for any discharge medications will be authorized once you are discharged, as it is imperative that you return to your primary care  physician (or establish a relationship with a primary care physician if you do not have one) for your aftercare needs so that they can reassess your need for medications and monitor your lab values.  Follow up with PCP and Gastroenterology as an outpatient setting. Take all medications as prescribed. If symptoms change or worsen please return to the ED for evaluation   Increase activity slowly   Complete by: As directed      Allergies as of 06/14/2020      Reactions   Amantadine Hcl Itching   REACTION: itching   Chlorpromazine Hcl    REACTION: "fell flat on face"      Medication List    STOP taking these medications   colchicine 0.6 MG tablet   omeprazole 20 MG capsule Commonly known as: PRILOSEC     TAKE these medications   acetaminophen 325 MG tablet Commonly known as: TYLENOL Take 650 mg by mouth every 6 (six) hours as needed for mild pain.   allopurinol 300 MG tablet Commonly known as: ZYLOPRIM Take 1 tablet by mouth once daily   benzonatate 200 MG capsule Commonly known as: TESSALON Take 1 capsule (200 mg total) by mouth 3 (three) times daily as needed for cough.   carvedilol 12.5 MG tablet Commonly known as: COREG 1/2 tab po qAM and 1 tab po qPM What changed:   how to take this  when to take this  additional instructions   clopidogrel 75 MG tablet Commonly known as: PLAVIX Take 1 tablet (75 mg total) by mouth daily. Hold for 5 Days prior to repeat EGD if needed. Start taking on: June 15, 2020 What changed: additional instructions   Euthyrox 25 MCG tablet Generic drug: levothyroxine Take 1 tablet by mouth once daily   hydrOXYzine 10 MG tablet Commonly known as: ATARAX/VISTARIL Take 10 mg by mouth 2 (two) times daily as needed for itching or anxiety.   Melatonin 2.5 MG Caps Take 2 capsules by mouth at bedtime as needed (sleep).   mycophenolate 180 MG EC tablet Commonly known as: MYFORTIC Take 360 mg by mouth 2 (two) times daily.   ondansetron 4  MG disintegrating tablet Commonly known as: Zofran ODT Take 1 tablet (4 mg total) by mouth every 8 (eight) hours as needed for nausea or vomiting.   pantoprazole 40 MG tablet Commonly known as: PROTONIX Take 1 tablet (40 mg total) by mouth 2 (two) times daily.   risperiDONE 1 MG tablet Commonly known as: RISPERDAL Take 1 mg by mouth at bedtime.   simvastatin 20 MG tablet Commonly known as: ZOCOR Take 1 tablet by mouth once daily   sulfamethoxazole-trimethoprim 400-80 MG tablet Commonly known as: BACTRIM Take 1 tablet by mouth  every Monday, Wednesday, and Friday. What changed: Another medication with the same name was removed. Continue taking this medication, and follow the directions you see here.   tacrolimus 0.5 MG capsule Commonly known as: PROGRAF See admin instructions. 2 in the am and 1 at night       Allergies  Allergen Reactions  . Amantadine Hcl Itching    REACTION: itching  . Chlorpromazine Hcl     REACTION: "fell flat on face"   Consultations:  Gastroenterology  Procedures/Studies: CT ABDOMEN PELVIS WO CONTRAST  Result Date: 06/13/2020 CLINICAL DATA:  Nausea vomiting weight loss over the last 3 weeks EXAM: CT CHEST WITHOUT CONTRAST TECHNIQUE: Multidetector CT imaging of the chest was performed following the standard protocol without IV contrast. COMPARISON:  September 26, 2019 FINDINGS: Cardiovascular: Coronary artery calcifications are seen. There is scattered aortic atherosclerosis noted. The heart size is normal. There is no pericardial thickening or effusion. Mediastinum/Nodes: No definite enlarged mediastinal adenopathy, however limited due to the lack of intravenous contrast. The thyroid gland, trachea and esophagus demonstrate no significant findings. Lungs/Pleura: Again noted is minimal ground-glass opacity at the posterior right lung base, likely atelectasis or scarring. No suspicious pulmonary nodules. There is no pleural effusion. Upper abdomen: The  visualized portion of the upper abdomen is unremarkable. Musculoskeletal/Chest wall: There is no chest wall mass or suspicious osseous finding. No acute osseous abnormality Abdomen/pelvis: Hepatobiliary: Although limited due to the lack of intravenous contrast, normal in appearance without gross focal abnormality. No evidence of calcified gallstones or biliary ductal dilatation. Pancreas:  Unremarkable.  No surrounding inflammatory changes. Spleen: Normal in size. Although limited due to the lack of intravenous contrast, normal in appearance. Adrenals/Urinary Tract: Both adrenal glands appear normal. There is renal atrophy of the bilateral native kidneys. Within the upper pole of the right kidney there is a slightly hyperdense lesion measuring 3.7 cm. Throughout both kidneys are low-density lesions. A right lower pelvic renal transplant is present. There is a 3 cm low-density lesion within the renal transplant, likely renal cyst. No hydronephrosis is seen. The bladder is unremarkable. Stomach/Bowel: The stomach and small bowel are normal in appearance. There is scattered colonic diverticula without diverticulitis. A moderate to large amount of colonic stool is present. Vascular/Lymphatic: There are no enlarged abdominal or pelvic lymph nodes. Scattered aortic atherosclerotic calcifications are seen without aneurysmal dilatation. Reproductive: A heterogeneously enlarged prostate gland is noted which protrudes into the posterior bladder. Other: No evidence of abdominal wall mass or hernia. Musculoskeletal: No acute or significant osseous findings. Degenerative changes are seen throughout the thoracolumbar spine. Anterior flowing osteophytes are seen in the lower thoracic spine. IMPRESSION: 1. No acute intrathoracic pathology to explain the patient's symptoms. 2. Coronary artery calcifications. 3. Slightly hyperdense 3.7 cm lesion within the right native kidney which could represent a proteinaceous or hemorrhagic cyst,  however cannot exclude a solid renal lesion. Would recommend renal ultrasound for further evaluation. 4. Diverticulosis without diverticulitis. 5.  Aortic Atherosclerosis (ICD10-I70.0). 6. Prostatomegaly Electronically Signed   By: Prudencio Pair M.D.   On: 06/13/2020 00:22   DG Chest 2 View  Result Date: 06/08/2020 CLINICAL DATA:  Cough and difficulty swallowing for the past 2 weeks. EXAM: CHEST - 2 VIEW COMPARISON:  CT chest dated November 26, 2019. Chest x-ray dated August 29, 2019. FINDINGS: The heart size and mediastinal contours are within normal limits. Both lungs are clear. The visualized skeletal structures are unremarkable. IMPRESSION: No active cardiopulmonary disease. Electronically Signed   By: Huntley Dec  Derry M.D.   On: 06/08/2020 16:10   CT Chest Wo Contrast  Result Date: 06/13/2020 CLINICAL DATA:  Nausea vomiting weight loss over the last 3 weeks EXAM: CT CHEST WITHOUT CONTRAST TECHNIQUE: Multidetector CT imaging of the chest was performed following the standard protocol without IV contrast. COMPARISON:  September 26, 2019 FINDINGS: Cardiovascular: Coronary artery calcifications are seen. There is scattered aortic atherosclerosis noted. The heart size is normal. There is no pericardial thickening or effusion. Mediastinum/Nodes: No definite enlarged mediastinal adenopathy, however limited due to the lack of intravenous contrast. The thyroid gland, trachea and esophagus demonstrate no significant findings. Lungs/Pleura: Again noted is minimal ground-glass opacity at the posterior right lung base, likely atelectasis or scarring. No suspicious pulmonary nodules. There is no pleural effusion. Upper abdomen: The visualized portion of the upper abdomen is unremarkable. Musculoskeletal/Chest wall: There is no chest wall mass or suspicious osseous finding. No acute osseous abnormality Abdomen/pelvis: Hepatobiliary: Although limited due to the lack of intravenous contrast, normal in appearance without  gross focal abnormality. No evidence of calcified gallstones or biliary ductal dilatation. Pancreas:  Unremarkable.  No surrounding inflammatory changes. Spleen: Normal in size. Although limited due to the lack of intravenous contrast, normal in appearance. Adrenals/Urinary Tract: Both adrenal glands appear normal. There is renal atrophy of the bilateral native kidneys. Within the upper pole of the right kidney there is a slightly hyperdense lesion measuring 3.7 cm. Throughout both kidneys are low-density lesions. A right lower pelvic renal transplant is present. There is a 3 cm low-density lesion within the renal transplant, likely renal cyst. No hydronephrosis is seen. The bladder is unremarkable. Stomach/Bowel: The stomach and small bowel are normal in appearance. There is scattered colonic diverticula without diverticulitis. A moderate to large amount of colonic stool is present. Vascular/Lymphatic: There are no enlarged abdominal or pelvic lymph nodes. Scattered aortic atherosclerotic calcifications are seen without aneurysmal dilatation. Reproductive: A heterogeneously enlarged prostate gland is noted which protrudes into the posterior bladder. Other: No evidence of abdominal wall mass or hernia. Musculoskeletal: No acute or significant osseous findings. Degenerative changes are seen throughout the thoracolumbar spine. Anterior flowing osteophytes are seen in the lower thoracic spine. IMPRESSION: 1. No acute intrathoracic pathology to explain the patient's symptoms. 2. Coronary artery calcifications. 3. Slightly hyperdense 3.7 cm lesion within the right native kidney which could represent a proteinaceous or hemorrhagic cyst, however cannot exclude a solid renal lesion. Would recommend renal ultrasound for further evaluation. 4. Diverticulosis without diverticulitis. 5.  Aortic Atherosclerosis (ICD10-I70.0). 6. Prostatomegaly Electronically Signed   By: Prudencio Pair M.D.   On: 06/13/2020 00:22   US  Renal  Result Date: 06/13/2020 CLINICAL DATA:  84 year old male with possible solid right renal mass versus hyperdense cyst on recent noncontrast CT. EXAM: RENAL / URINARY TRACT ULTRASOUND COMPLETE COMPARISON:  CT Chest, Abdomen, and Pelvis last night. FINDINGS: Right Kidney: Renal measurements: 8.0 x 4.6 x 4.2 cm = volume: 83 mL. Echogenic right kidney (image 3) with multiple (up to or less than 10 in total) hypoechoic cysts. Compared to the CT, no convincing solid right renal mass, the upper pole lesion in question is probably that seen on images 36 through 39 measuring about 4.3 cm diameter, bilobed, with no vascular elements on Doppler. No suspicious right renal lesion. Left Kidney: Renal measurements: 9.6 x 5.2 x 4.5 cm = volume: 117 mL. Echogenic left kidney (image 47) with multiple benign appearing hypoechoic cysts (up to or less than 10 in total). No left hydronephrosis or  suspicious left renal lesion. Bladder: Appears normal for degree of bladder distention. Right renal jet detected with Doppler. Other: Right lower quadrant transplant kidney with normal cortical echogenicity (image 71) measuring 5.2 x 4.6 x 8.5 cm. No transplant hydronephrosis. Benign appearing 3 cm transplant midpole cyst (images 77 and 78). Prostatomegaly, prostate diameter up to 65 mm. IMPRESSION: 1. Native kidney medical renal disease, atrophy with benign appearing polycystic disease. The right upper pole lesion in question on the CT appears to be a nonvascular cyst. 2. Right lower quadrant renal transplant with benign cyst and no hydronephrosis. Electronically Signed   By: Genevie Ann M.D.   On: 06/13/2020 07:45    EGD Findings:      LA Grade A (one or more mucosal breaks less than 5 mm, not extending       between tops of 2 mucosal folds) esophagitis with no bleeding was found       in the lower third of the esophagus.      One benign-appearing, intrinsic mild stenosis was found 40 cm from the       incisors. This stenosis  measured less than one cm (in length). The       stenosis was traversed. Given the appearance and active Plavix, this was       not dilated today in favor of cold forceps for fracturing of the ring.       Estimated blood loss was minimal.      Two areas of ectopic gastric mucosa were found in the upper third of the       esophagus, 18 cm from the incisors.      Multiple 2 to 6 mm sessile polyps with no bleeding and no stigmata of       recent bleeding were found in the gastric fundus and in the gastric       body. Several of these polyps were removed with a cold biopsy forceps.       Resection and retrieval were complete. Estimated blood loss was minimal.      The incisura, gastric antrum and pylorus were normal.      The ampulla, duodenal bulb, first portion of the duodenum and second       portion of the duodenum were normal. Impression:               - LA Grade A reflux esophagitis with no bleeding.                           - Benign-appearing esophageal stenosis. Biopsied.                           - Ectopic gastric mucosa in the upper third of the                            esophagus.                           - Multiple gastric polyps. Resected and retrieved.                           - Normal incisura, antrum and pylorus.                           -  Normal ampulla, duodenal bulb, first portion of                            the duodenum and second portion of the duodenum.  Subjective: Seen and examined at bedside and patient was doing much better after his EGD.  He is able to tolerate a soft diet without issues.  No nausea or vomiting.  Ready to go home.  No other concerns or plans at this time and all questions were answered to his satisfaction.  Discharge Exam: Vitals:   06/14/20 1030 06/14/20 1405  BP: 130/71 139/75  Pulse: 74 88  Resp: 15 14  Temp: 98 F (36.7 C) 97.8 F (36.6 C)  SpO2: 95% 91%   Vitals:   06/14/20 1010 06/14/20 1015 06/14/20 1030 06/14/20 1405  BP:  120/62  130/71 139/75  Pulse: 71  74 88  Resp: 20  15 14   Temp:   98 F (36.7 C) 97.8 F (36.6 C)  TempSrc:      SpO2: 94% 90% 95% 91%  Weight:      Height:       General: Pt is alert, awake, not in acute distress Cardiovascular: RRR, S1/S2 +, no rubs, no gallops Respiratory: Slightly diminished bilaterally, no wheezing, no rhonchi Abdominal: Soft, NT, ND, bowel sounds + Extremities: no edema, no cyanosis  The results of significant diagnostics from this hospitalization (including imaging, microbiology, ancillary and laboratory) are listed below for reference.    Microbiology: Recent Results (from the past 240 hour(s))  SARS Coronavirus 2 by RT PCR (hospital order, performed in [facility] hospital lab) Nasopharyngeal Nasopharyngeal Swab     Status: None   Collection Time: 06/08/20  4:22 PM   Specimen: Nasopharyngeal Swab  Result Value Ref Range Status   SARS Coronavirus 2 NEGATIVE NEGATIVE Final    Comment: (NOTE) SARS-CoV-2 target nucleic acids are NOT DETECTED.  The SARS-CoV-2 RNA is generally detectable in upper and lower respiratory specimens during the acute phase of infection. The lowest concentration of SARS-CoV-2 viral copies this assay can detect is 250 copies / mL. A negative result does not preclude SARS-CoV-2 infection and should not be used as the sole basis for treatment or other patient management decisions.  A negative result may occur with improper specimen collection / handling, submission of specimen other than nasopharyngeal swab, presence of viral mutation(s) within the areas targeted by this assay, and inadequate number of viral copies (<250 copies / mL). A negative result must be combined with clinical observations, patient history, and epidemiological information.  Fact Sheet for Patients:   StrictlyIdeas.no  Fact Sheet for Healthcare Providers: BankingDealers.co.za  This test is not yet approved or   cleared by the [place] FDA and has been authorized for detection and/or diagnosis of SARS-CoV-2 by FDA under an Emergency Use Authorization (EUA).  This EUA will remain in effect (meaning this test can be used) for the duration of the COVID-19 declaration under Section 564(b)(1) of the Act, 21 U.S.C. section 360bbb-3(b)(1), unless the authorization is terminated or revoked sooner.  Performed at [facility], [place] [address], [place], [place] [address]   SARS Coronavirus 2 by RT PCR (hospital order, performed in [facility] hospital lab) Nasopharyngeal Nasopharyngeal Swab     Status: None   Collection Time: 06/13/20  3:30 AM   Specimen: Nasopharyngeal Swab  Result Value Ref Range Status   SARS Coronavirus 2 NEGATIVE NEGATIVE Final  Comment: (NOTE) SARS-CoV-2 target nucleic acids are NOT DETECTED.  The SARS-CoV-2 RNA is generally detectable in upper and lower respiratory specimens during the acute phase of infection. The lowest concentration of SARS-CoV-2 viral copies this assay can detect is 250 copies / mL. A negative result does not preclude SARS-CoV-2 infection and should not be used as the sole basis for treatment or other patient management decisions.  A negative result may occur with improper specimen collection / handling, submission of specimen other than nasopharyngeal swab, presence of viral mutation(s) within the areas targeted by this assay, and inadequate number of viral copies (<250 copies / mL). A negative result must be combined with clinical observations, patient history, and epidemiological information.  Fact Sheet for Patients:   StrictlyIdeas.no  Fact Sheet for Healthcare Providers: BankingDealers.co.za  This test is not yet approved or  cleared by the [place] FDA and has been authorized for detection and/or diagnosis of SARS-CoV-2 by FDA under an Emergency Use Authorization (EUA).   This EUA will remain in effect (meaning this test can be used) for the duration of the COVID-19 declaration under Section 564(b)(1) of the Act, 21 U.S.C. section 360bbb-3(b)(1), unless the authorization is terminated or revoked sooner.  Performed at [facility], [place] [address], [place], [place] [address]     Labs: BNP (last 3 results) No results for input(s): BNP in the last 8760 hours. Basic Metabolic Panel: Recent Labs  Lab 06/08/20 1535 06/11/20 1115 06/12/20 2256 06/13/20 0606 06/14/20 0326  NA 138 134* 135 133* 135  K 4.7 4.8 4.7 4.1 4.1  CL 106 102 105 103 108  CO2 20* 19* 19* 20* 20*  GLUCOSE 106* 90 103* 80 83  BUN 32* 32* 34* 33* 23  CREATININE 2.27* 2.18* 2.03* 1.81* 1.54*  CALCIUM 9.2 9.7 9.4 8.7* 8.6*   Liver Function Tests: Recent Labs  Lab 06/12/20 2256  AST 22  ALT 19  ALKPHOS 49  BILITOT 0.9  PROT 6.4*  ALBUMIN 3.7   Recent Labs  Lab 06/12/20 2256  LIPASE 49   No results for input(s): AMMONIA in the last 168 hours. CBC: Recent Labs  Lab 06/08/20 1535 06/11/20 1115 06/12/20 2256 06/14/20 0326  WBC 7.2 6.2 6.6 5.1  NEUTROABS  --  4,836 4.6  --   HGB 14.4 14.3 14.1 12.9*  HCT 45.2 45.5 44.0 40.0  MCV 88.3 88.7 87.6 87.3  PLT 210 226 216 192   Cardiac Enzymes: No results for input(s): CKTOTAL, CKMB, CKMBINDEX, TROPONINI in the last 168 hours. BNP: Invalid input(s): POCBNP CBG: No results for input(s): GLUCAP in the last 168 hours. D-Dimer No results for input(s): DDIMER in the last 72 hours. Hgb A1c No results for input(s): HGBA1C in the last 72 hours. Lipid Profile No results for input(s): CHOL, HDL, LDLCALC, TRIG, CHOLHDL, LDLDIRECT in the last 72 hours. Thyroid function studies No results for input(s): TSH, T4TOTAL, T3FREE, THYROIDAB in the last 72 hours.  Invalid input(s): FREET3 Anemia work up No results for input(s): VITAMINB12, FOLATE, FERRITIN, TIBC, IRON, RETICCTPCT in the last 72  hours. Urinalysis    Component Value Date/Time   COLORURINE YELLOW 06/12/2020 2300   APPEARANCEUR HAZY (A) 06/12/2020 2300   LABSPEC 1.013 06/12/2020 2300   PHURINE 5.0 06/12/2020 2300   GLUCOSEU NEGATIVE 06/12/2020 2300   HGBUR NEGATIVE 06/12/2020 2300   BILIRUBINUR NEGATIVE 06/12/2020 2300   BILIRUBINUR negative 02/09/2017 1510   KETONESUR 5 (A) 06/12/2020 2300   PROTEINUR NEGATIVE 06/12/2020 2300  UROBILINOGEN 0.2 02/09/2017 1510   NITRITE NEGATIVE 06/12/2020 2300   LEUKOCYTESUR NEGATIVE 06/12/2020 2300   Sepsis Labs Invalid input(s): PROCALCITONIN,  WBC,  LACTICIDVEN Microbiology Recent Results (from the past 240 hour(s))  SARS Coronavirus 2 by RT PCR (hospital order, performed in [facility] hospital lab) Nasopharyngeal Nasopharyngeal Swab     Status: None   Collection Time: 06/08/20  4:22 PM   Specimen: Nasopharyngeal Swab  Result Value Ref Range Status   SARS Coronavirus 2 NEGATIVE NEGATIVE Final    Comment: (NOTE) SARS-CoV-2 target nucleic acids are NOT DETECTED.  The SARS-CoV-2 RNA is generally detectable in upper and lower respiratory specimens during the acute phase of infection. The lowest concentration of SARS-CoV-2 viral copies this assay can detect is 250 copies / mL. A negative result does not preclude SARS-CoV-2 infection and should not be used as the sole basis for treatment or other patient management decisions.  A negative result may occur with improper specimen collection / handling, submission of specimen other than nasopharyngeal swab, presence of viral mutation(s) within the areas targeted by this assay, and inadequate number of viral copies (<250 copies / mL). A negative result must be combined with clinical observations, patient history, and epidemiological information.  Fact Sheet for Patients:   StrictlyIdeas.no  Fact Sheet for Healthcare Providers: BankingDealers.co.za  This test is not yet  approved or  cleared by the [place] FDA and has been authorized for detection and/or diagnosis of SARS-CoV-2 by FDA under an Emergency Use Authorization (EUA).  This EUA will remain in effect (meaning this test can be used) for the duration of the COVID-19 declaration under Section 564(b)(1) of the Act, 21 U.S.C. section 360bbb-3(b)(1), unless the authorization is terminated or revoked sooner.  Performed at [facility], [place] [address], [place], [place] [address]   SARS Coronavirus 2 by RT PCR (hospital order, performed in [facility] hospital lab) Nasopharyngeal Nasopharyngeal Swab     Status: None   Collection Time: 06/13/20  3:30 AM   Specimen: Nasopharyngeal Swab  Result Value Ref Range Status   SARS Coronavirus 2 NEGATIVE NEGATIVE Final    Comment: (NOTE) SARS-CoV-2 target nucleic acids are NOT DETECTED.  The SARS-CoV-2 RNA is generally detectable in upper and lower respiratory specimens during the acute phase of infection. The lowest concentration of SARS-CoV-2 viral copies this assay can detect is 250 copies / mL. A negative result does not preclude SARS-CoV-2 infection and should not be used as the sole basis for treatment or other patient management decisions.  A negative result may occur with improper specimen collection / handling, submission of specimen other than nasopharyngeal swab, presence of viral mutation(s) within the areas targeted by this assay, and inadequate number of viral copies (<250 copies / mL). A negative result must be combined with clinical observations, patient history, and epidemiological information.  Fact Sheet for Patients:   StrictlyIdeas.no  Fact Sheet for Healthcare Providers: BankingDealers.co.za  This test is not yet approved or  cleared by the [place] FDA and has been authorized for detection and/or diagnosis of SARS-CoV-2 by FDA under an Emergency Use  Authorization (EUA).  This EUA will remain in effect (meaning this test can be used) for the duration of the COVID-19 declaration under Section 564(b)(1) of the Act, 21 U.S.C. section 360bbb-3(b)(1), unless the authorization is terminated or revoked sooner.  Performed at [facility], [place] [address] [place], [place] [address]    Time coordinating discharge: 35 minutes  SIGNED:  Kerney Elbe, DO [facility] 06/14/2020, 8:12 PM Pager is on [place]  If 7PM-7AM, please contact night-coverage [email or web address]

## 2020-06-14 NOTE — Evaluation (Signed)
Occupational Therapy Evaluation and Discharge Patient Details Name: Curtis Jimenez MRN: [id number] DOB: October 09, 1936 Today's Date: 06/14/2020    History of Present Illness 84 y.o. 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  Patient states for the past 3 weeks he has had difficulty swallowing solids and vomiting.  It feels as if food is getting stuck in his throat.  Sometimes his throat is painful when he tries to swallow but does not experience any chest pain with swallowing.  He is only able to consume 2 ounces of food at a time but has not had any difficulty swallowing water.  He is able to swallow most of his medications.  Reports having a lot of acid reflux.   Clinical Impression   Pt with some mild imbalance, recommended supervision by his wife for safety. Wife is in good health and can provide 24 hour care. No OT needs.    Follow Up Recommendations  No OT follow up    Equipment Recommendations  None recommended by OT    Recommendations for Other Services       Precautions / Restrictions Precautions Precautions: None Restrictions Weight Bearing Restrictions: No      Mobility Bed Mobility Overal bed mobility: Modified Independent             General bed mobility comments: slightly increased time  Transfers Overall transfer level: Needs assistance Equipment used: None Transfers: Sit to/from Stand Sit to Stand: Modified independent (Device/Increase time)         General transfer comment: BUE assisting to rise, good steadiness upon rising    Balance Overall balance assessment: Needs assistance Sitting-balance support: Feet supported;Bilateral upper extremity supported Sitting balance-Leahy Scale: Good Sitting balance -  Comments: seated EOB   Standing balance support: During functional activity;No upper extremity supported Standing balance-Leahy Scale: Good Standing balance comment: no AD, slightly unsteady gait with drifting R/L but no loss of balance or physical assist                           ADL either performed or assessed with clinical judgement   ADL                                         General ADL Comments: Supervision for safety.     Vision Patient Visual Report: No change from baseline       Perception     Praxis      Pertinent Vitals/Pain Pain Assessment: No/denies pain     Hand Dominance Right   Extremity/Trunk Assessment Upper Extremity Assessment Upper Extremity Assessment: Overall WFL for tasks assessed   Lower Extremity Assessment Lower Extremity Assessment: Overall WFL for tasks assessed   Cervical / Trunk Assessment Cervical / Trunk Assessment: Normal   Communication Communication Communication: No difficulties   Cognition Arousal/Alertness: Awake/alert Behavior During Therapy: WFL for tasks assessed/performed Overall Cognitive Status: Within Functional Limits for tasks assessed                                     General Comments       Exercises  Shoulder Instructions      Home Living Family/patient expects to be discharged to:: Private residence Living Arrangements: Spouse/significant other Available Help at Discharge: Family;Available 24 hours/day Type of Home: House Home Access: Level entry     Home Layout: One level     Bathroom Shower/Tub: Tub/shower unit;Walk-in shower         Home Equipment: Kasandra Knudsen - single point;Walker - 2 wheels;Shower seat - built in;Hand held shower head          Prior Functioning/Environment Level of Independence: Independent        Comments: sits to shower, drives, ambulates without a device        OT Problem List:        OT Treatment/Interventions:       OT Goals(Current goals can be found in the care plan section) Acute Rehab OT Goals Patient Stated Goal: OPPT for balance  OT Frequency:     Barriers to D/C:            Co-evaluation              AM-PAC OT "6 Clicks" Daily Activity     Outcome Measure Help from another person eating meals?: None Help from another person taking care of personal grooming?: None Help from another person toileting, which includes using toliet, bedpan, or urinal?: None Help from another person bathing (including washing, rinsing, drying)?: None Help from another person to put on and taking off regular upper body clothing?: None Help from another person to put on and taking off regular lower body clothing?: None 6 Click Score: 24   End of Session    Activity Tolerance: Patient tolerated treatment well Patient left: in bed;with call bell/phone within reach;with family/visitor present  OT Visit Diagnosis: Muscle weakness (generalized) (M62.81)                Time: 1536-1550 OT Time Calculation (min): 14 min Charges:  OT General Charges $OT Visit: 1 Visit OT Evaluation $OT Eval Low Complexity: 1 Low  Nestor Lewandowsky, OTR/L Acute Rehabilitation Services Pager: [phone] Office: [phone]  Malka So 06/14/2020, 3:56 PM

## 2020-06-14 NOTE — Anesthesia Postprocedure Evaluation (Signed)
Anesthesia Post Note  Patient: Curtis Jimenez  Procedure(s) Performed: ESOPHAGOGASTRODUODENOSCOPY (EGD) (N/A ) BIOPSY     Patient location during evaluation: PACU Anesthesia Type: MAC Level of consciousness: awake and alert Pain management: pain level controlled Vital Signs Assessment: post-procedure vital signs reviewed and stable Respiratory status: spontaneous breathing, nonlabored ventilation, respiratory function stable and patient connected to nasal cannula oxygen Cardiovascular status: stable and blood pressure returned to baseline Postop Assessment: no apparent nausea or vomiting Anesthetic complications: no   No complications documented.  Last Vitals:  Vitals:   06/14/20 1015 06/14/20 1030  BP:  130/71  Pulse:  74  Resp:  15  Temp:  36.7 C  SpO2: 90% 95%    Last Pain:  Vitals:   06/14/20 1109  TempSrc:   PainSc: 0-No pain                 Yecheskel Kurek

## 2020-06-17 ENCOUNTER — Encounter (HOSPITAL_COMMUNITY): Payer: Self-pay | Admitting: Gastroenterology

## 2020-06-17 ENCOUNTER — Telehealth: Payer: Self-pay | Admitting: Family Medicine

## 2020-06-17 ENCOUNTER — Encounter: Payer: Self-pay | Admitting: Family Medicine

## 2020-06-17 ENCOUNTER — Other Ambulatory Visit: Payer: Self-pay

## 2020-06-17 ENCOUNTER — Telehealth: Payer: Self-pay

## 2020-06-17 LAB — SURGICAL PATHOLOGY

## 2020-06-17 NOTE — Telephone Encounter (Signed)
-----   Message from Levin Erp, [place] sent at 06/17/2020  8:37 AM EDT ----- Regarding: FW: EGD done at [facility] Patient needs follow up in clinic with me or Dr Silverio Decamp in 2-3 weeks. Thanks-JLL ----- Message ----- From: Noralyn Pick, NP Sent: 06/14/2020  11:54 AM EDT To: Mauri Pole, MD, # Subject: EGD done at [facility]                                Dr. Silverio Decamp, and Anderson Malta. Mr. Eagon underwent an EGD today at [facility]. See EGD procedure report and Dr. Vivia Ewing recommendations as follows:  There was a nonobstructing peptic stricture in the distal esophagus, along with LA Grade A esophagitis, and benign-appearing fundic gland polyps.  Several of the polyps were removed with cold forceps.  Since last dose of Plavix was just 2 days ago, esophageal dilation was not performed.  However, able to fracture the peptic stricture using cold forceps.  EGD also notable for 2 gastric inlet patch areas in the proximal esophagus.  One was completely smooth, and the smaller one with a subtle area of irritation.  However, given the very proximal location, actively circulating Plavix, this was not biopsied today, in favor of medical management.  No areas suggestive of esophageal infection.  -Will continue with high-dose PPI  -Okay to resume soft foods now and continue to advance as tolerated  -If symptoms should persist as an outpatient, can plan on repeat EGD with 5-day Plavix hold to allow for both esophageal balloon dilation along with potential biopsy of the gastric inlet patch as appropriate.  If unrevealing/unresponsive, could consider esophageal manometry and/or CT neck  -Should be okay for discharge later today provided he is tolerating p.o. intake.     Please schedule follow up in GI clinic   Thanks,  Earl

## 2020-06-17 NOTE — Telephone Encounter (Signed)
Patients wife called for referral information during recent  visit ,advised to call PCP for more information .

## 2020-06-17 NOTE — Telephone Encounter (Signed)
appt made with Curtis Jimenez for 07/03/20 at 130 pm   The pt wife has been advised and understanding verbalized

## 2020-06-18 ENCOUNTER — Encounter: Payer: Self-pay | Admitting: Family Medicine

## 2020-06-18 ENCOUNTER — Encounter: Payer: Self-pay | Admitting: Gastroenterology

## 2020-06-18 ENCOUNTER — Telehealth: Payer: Self-pay

## 2020-06-18 NOTE — Telephone Encounter (Signed)
Spoke with wife, Lenna Sciara.   Transition Care Management Follow-up Telephone Call  Admission: 06/12/2020-06/14/2021 Diagnosis: Emesis   How have you been since you were released from the hospital? "He's weak and fatigued". Wife reports emesis has resolved. Eating and fluid intake have increased.    Do you understand why you were in the hospital? yes   Do you understand the discharge instructions? yes   Where were you discharged to? Home. Resides with wife.    Items Reviewed:  Medications reviewed: yes  Allergies reviewed: yes  Dietary changes reviewed: yes  Referrals reviewed: yes, f/u with GI scheduled. Awaiting to be contacted for PT referral.    Functional Questionnaire:   Activities of Daily Living (ADLs):   He states they are independent in the following: ambulation, bathing and hygiene, feeding, continence, grooming, toileting and dressing States they require assistance with the following: None.    Any transportation issues/concerns?: no   Any patient concerns? yes, would like assistance with getting PT started.    Confirmed importance and date/time of follow-up visits scheduled yes  Provider Appointment booked with PCP on 06/28/20.   Confirmed with patient if condition begins to worsen call PCP or go to the ER.  Patient was given the office number and encouraged to call back with question or concerns.  : yes

## 2020-06-18 NOTE — Telephone Encounter (Signed)
Curtis Jimenez it looks as though a referral was placed for this pt on 06/14/2020. Is there a way to check on this? What do we need to do?

## 2020-06-18 NOTE — Telephone Encounter (Signed)
Noted: nurse phone contact with patient for TCM. Signed:  Crissie Sickles, MD           06/18/2020

## 2020-06-19 NOTE — Telephone Encounter (Signed)
LM with Rehab social worker [phone]), requested return call to determine referral status.  Attempted to call Outpatient PT department on [facility] [phone]), no answer.   Spoke with patient, provided update.

## 2020-06-25 ENCOUNTER — Other Ambulatory Visit: Payer: Self-pay | Admitting: *Deleted

## 2020-06-25 ENCOUNTER — Encounter: Payer: Self-pay | Admitting: *Deleted

## 2020-06-25 NOTE — Patient Outreach (Addendum)
[facility]) Care Management  06/25/2020  OMRI BERTRAN [birth year] [id number]   Subjective: Telephone call to patient's home / mobile number, spoke with patient, and HIPAA verified.  Discussed [facility] Care Management Bernadene Person EMMI  General Discharge Red Flag Alert follow up, patient voiced understanding, and is in agreement to follow up.  RNCM advised patient there are no cost to patient for [facility] Care Management services, patient disagreed, he states there are no reduction in cost for services as with other providers, RNCM advised patient to verify with insurance company customer service, patient declined to verify with insurance company, and agreed to continue to complete telephone assessment.  Patient states he remembers receiving EMMI automated call has filled his prescriptions and has no problems.  Patient states he is doing ok, has a follow up appointment with primary MD on 06/28/2020, and also on 07/22/2020 per [facility] / Epic.   Patient declined medication review.  Patient states he is able to manage self care and has assistance as needed.  Patient voices understanding of medical diagnosis and treatment plan.   States he is accessing his [place] Medicare benefits as needed via member services number on back of card.   Patient states he does not have any education material, EMMI follow up, care coordination, care management, disease monitoring, transportation, community resource, or pharmacy needs at this time. States he is very appreciative of the follow up, decline  to receive 1 additional follow up call to assess for further CM needs, and decline to receive [facility] Care Management EMMI follow up calls as needed.    Objective:  Per KPN (Knowledge Performance Now, point of care tool) and chart review, patient hospitalized 06/12/2020 - 06/14/2020 for Emesis, Weight loss, AKI (acute kidney injury), Metabolic acidosis, Peptic stricture of esophagus.  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      Assessment: Received Bernadene Person EMMI General Discharge Red Flag Alert referral follow up on 06/20/2020.   Red Flag Alert Triggers, Day #1, times 2, patient answered yes to the following questions:Unfilled prescriptions?   Other questions/problems?  EMMI follow up completed, assessed no further needs, patient declined further follow up, and will proceed with case closure.      Plan: RNCM will send request to discontinue EMMI automated calls to Gloris Manchester at [facility] per patient's request.   RNCM will close case due to patient declining [facility] Care Management services.     Drago Hammonds H. Annia Friendly, BSN, [place] [facility] Telephonic CM Phone: [phone] Fax: [phone]

## 2020-06-26 NOTE — Telephone Encounter (Signed)
Attempted to call Rehab social worker([phone]), LMCTB to determine referral status. No answer when attempting to call Outpatient PT department on [facility]. No further updates at this time.

## 2020-06-28 ENCOUNTER — Other Ambulatory Visit: Payer: Self-pay

## 2020-06-28 ENCOUNTER — Ambulatory Visit (INDEPENDENT_AMBULATORY_CARE_PROVIDER_SITE_OTHER): Payer: Medicare HMO | Admitting: Family Medicine

## 2020-06-28 ENCOUNTER — Encounter: Payer: Self-pay | Admitting: Family Medicine

## 2020-06-28 VITALS — BP 128/67 | HR 87 | Temp 98.1°F | Resp 16 | Ht 69.0 in | Wt 178.0 lb

## 2020-06-28 DIAGNOSIS — R05 Cough: Secondary | ICD-10-CM | POA: Diagnosis not present

## 2020-06-28 DIAGNOSIS — R1310 Dysphagia, unspecified: Secondary | ICD-10-CM | POA: Diagnosis not present

## 2020-06-28 DIAGNOSIS — R059 Cough, unspecified: Secondary | ICD-10-CM

## 2020-06-28 DIAGNOSIS — K222 Esophageal obstruction: Secondary | ICD-10-CM

## 2020-06-28 DIAGNOSIS — E860 Dehydration: Secondary | ICD-10-CM | POA: Diagnosis not present

## 2020-06-28 DIAGNOSIS — D849 Immunodeficiency, unspecified: Secondary | ICD-10-CM

## 2020-06-28 DIAGNOSIS — Z8673 Personal history of transient ischemic attack (TIA), and cerebral infarction without residual deficits: Secondary | ICD-10-CM

## 2020-06-28 DIAGNOSIS — Z940 Kidney transplant status: Secondary | ICD-10-CM | POA: Diagnosis not present

## 2020-06-28 DIAGNOSIS — R2681 Unsteadiness on feet: Secondary | ICD-10-CM

## 2020-06-28 DIAGNOSIS — N189 Chronic kidney disease, unspecified: Secondary | ICD-10-CM | POA: Diagnosis not present

## 2020-06-28 DIAGNOSIS — R1319 Other dysphagia: Secondary | ICD-10-CM

## 2020-06-28 NOTE — Progress Notes (Signed)
06/30/2020  CC:  Chief Complaint  Patient presents with  . Hospitalization Follow-up    Patient is a 84 y.o. Caucasian male who presents for  hospital follow up, specifically [facility] face-to-face visit. Dates hospitalized: 6/16/-06/14/2020. Days since d/c from hospital: 14 days Patient was discharged from hospital to home. Reason for admission to hospital: cough, dysphagia/regurg, vomiting, ARF, dehydration. Date of interactive (phone) contact with patient and/or caregiver: 06/18/20  I have reviewed patient's discharge summary plus pertinent specific notes, labs, and imaging from the hospitalization.    Currently:  Feeling some improved. No more dysphagia, odynophagia, regurg, or vomiting.  No abd pain.  No cough when eating but still with some dry coughing which he says he thinks is a bit better the last 4-5 days: taking tessalon tid.  He got forceps dilation in distal esoph, was put on bid pantopand has GI f/u in 5 d to see if any further treatment needed for his esoph stenosis, specifically balloon dilation.  He had return of pretty persistent ataxia/gait instability as he got this acute illness---veering to the left.  This has been a residual effect from his past CVA but was minimal--simply exaggerated more in his acute illness.  This is once again improving but he still feels pretty unstable and would like PT.  He is wondering if he needs a 3rd shot of pfizer covid vaccine b/c of neg ab test he had after his 2nd shot--this is being noted in pt's with chronic med-induced immunosuppression.  Medication reconciliation was done today and patient is taking meds as recommended by discharging hospitalist/specialist.    PMH:  Past Medical History:  Diagnosis Date  . Anemia   . Basal cell carcinoma    neck (skin MD 2X per year)  . Bifascicular block 2021   ectopy, asymptomatic.  Cards->observe  . Bipolar disorder ([place])   . BPH (benign prostatic hyperplasia)    with  elevated PSA; followed by Dr. Rosana Hoes at [facility]  . Chronic renal insufficiency, stage III (moderate)    in pt w/hx of renal transplant.  Post-transplant sCr 1.4-1.6.  Last renal f/u 05/01/19->Cr 1.63, GFR 39 ml/min  . Coronary atherosclerosis    LM and 3 V dz noted on CT 08/2019 performed for eval of interstitial lung dz in the setting of mild DOE and mild hypoxia. Cards->no stress tesing indicated->primary prevention emphasized  . CVA (cerebral vascular accident) ([place]) 11/2016   Pontine (vertebrobasilar--imaging neg), TPA given.  Pt discharged on plavix.  Carotid dopplers ok, echocardiogram ok.  Residual deficit: vertigo but this improved greatly with therapy.  . DOE (dyspnea on exertion) 2020   DOE and ? hypoxia->CXR 08/29/19--->diffuse interstitial opacities-? acute inflammatory process->Dr. Melvyn Novas eval'd him and was underwhelmed by the CXR and exam->CT chest 09/26/19 "Very subtle areas of mild ground-glass attenuatio-nonspecific", mild air trapping. Per Dr. Melvyn Novas, no signif ILD, improving with inc ambulation (post-inflamm pulm fibrosis?)  . Esophagitis 06/14/2020   EGD->+distal esophagitis, h pylori neg, mild distal esoph stenosis widened with forceps, benign gastric polyps-->GI plans baloon dilatation in near future  . Gallbladder polyp 2015   Asymptomatic  . GERD (gastroesophageal reflux disease)   . Gout   . History of adenomatous polyp of colon 2018   Recall 5 yrs  . Hyperlipidemia   . Hypertension   . Hypothyroidism   . Lumbar spondylosis   . Ptosis due to aging    Right  . Rectal bleeding 09/2017   Admitted for obs due to Hb drop.  GI consulted---obs recommended.  No transfusion required.  Plavix was d/c'd, ferrous sulfate recommended.  Outpt GI f/u--plavix restarted and upper endoscopy was normal and colonoscopy showed 2 polyps and severe diverticular dz. Iron d/c'd 04/24/19.  Marland Kitchen Restless legs syndrome   . S/p cadaver renal transplant 2013   Secondary to HTN +lithium toxicity over  30 yrs caused kidney destruction [facility] transplant MDs)  . Seborrheic dermatitis    eyebrows worst: Hytone rx'd by Dr. Denna Haggard.  . 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 AV FISTULA PLACEMENT  04/08/2012   Procedure: ARTERIOVENOUS (AV) FISTULA CREATION;  Surgeon: Angelia Mould, MD;  Location: [facility] OR;  Service: Vascular;  Laterality: Left;  Creation of left brachial cephalic arteriovenous fistula  . BIOPSY  06/14/2020   Procedure: BIOPSY;  Surgeon: Lavena Bullion, DO;  Location: [facility];  Service: Gastroenterology;;  . CATARACT EXTRACTION, BILATERAL  05/2018  . COLONOSCOPY  2013; 11/24/17   2013; Normal except diverticulosis (recall 10 yrs).  2018 (for GI bleeding)--severe diverticular dz + 2 adenomatous polyps.  Recall 5 yrs.  . DG CHEST AP OR PA ([facility] HX)  06/03/2020   Bibasilar atelectasis. No focal consoliadation definitively identified.  . dilation for GERD    . ESOPHAGOGASTRODUODENOSCOPY  11/24/2017   gastric polyps x 2, otherwise normal.  . ESOPHAGOGASTRODUODENOSCOPY N/A 06/14/2020   +distal esophagitis, h pylori neg, mild distal esoph stenosis widened with forceps, benign gastric polypsProcedure: ESOPHAGOGASTRODUODENOSCOPY (EGD);  Surgeon: Lavena Bullion, DO;  Location: [facility];  Service: Gastroenterology;  Laterality: N/A;  Diagnostic EGD as patietn last took Plavix 11am on 06/12/2020  . KIDNEY TRANSPLANT  11/06/12   [place] (cadaveric)  . PROSTATE BIOPSY  2011   no malignancy  . TRANSTHORACIC ECHOCARDIOGRAM  11/2016   Normal LV systolic fxn, EF 31-54%.  Grade I DD.  Mild aortic root dilatation, mild MV regurg.    MEDS:  Outpatient Medications Prior to Visit  Medication Sig Dispense Refill  . acetaminophen (TYLENOL) 325  MG tablet Take 650 mg by mouth every 6 (six) hours as needed for mild pain.    Marland Kitchen allopurinol (ZYLOPRIM) 300 MG tablet Take 1 tablet by mouth once daily 90 tablet 0  . benzonatate (TESSALON) 200 MG capsule Take 1 capsule (200 mg total) by mouth 3 (three) times daily as needed for cough. 30 capsule 2  . carvedilol (COREG) 12.5 MG tablet 1/2 tab po qAM and 1 tab po qPM (Patient taking differently: Take by mouth See admin instructions. 1/2 tab po qAM and 1/2 tab po qPM) 135 tablet 3  . clopidogrel (PLAVIX) 75 MG tablet Take 1 tablet (75 mg total) by mouth daily. Hold for 5 Days prior to repeat EGD if needed. 90 tablet 0  . EUTHYROX 25 MCG tablet Take 1 tablet by mouth once daily 90 tablet 3  . hydrOXYzine (ATARAX/VISTARIL) 10 MG tablet Take 10 mg by mouth 2 (two) times daily as needed for itching or anxiety.     . Melatonin 2.5 MG CAPS Take 2 capsules by mouth at bedtime as needed (sleep).     . mycophenolate (MYFORTIC) 180 MG EC tablet Take 360 mg by mouth 2 (two) times daily.     . pantoprazole (PROTONIX) 40 MG tablet Take 1 tablet (  40 mg total) by mouth 2 (two) times daily. 60 tablet 0  . risperiDONE (RISPERDAL) 1 MG tablet Take 1 mg by mouth at bedtime.     . simvastatin (ZOCOR) 20 MG tablet Take 1 tablet by mouth once daily 90 tablet 1  . sulfamethoxazole-trimethoprim (BACTRIM,SEPTRA) 400-80 MG tablet Take 1 tablet by mouth every Monday, Wednesday, and Friday.    . tacrolimus (PROGRAF) 0.5 MG capsule See admin instructions. 2 in the am and 1 at night     . ondansetron (ZOFRAN ODT) 4 MG disintegrating tablet Take 1 tablet (4 mg total) by mouth every 8 (eight) hours as needed for nausea or vomiting. (Patient not taking: Reported on 06/28/2020) 20 tablet 0   No facility-administered medications prior to visit.   EXAM:  Vitals with BMI 06/28/2020 06/14/2020 06/14/2020  Height 5\' 9"  - -  Weight 178 lbs - -  BMI 77.82 - -  Systolic 423 536 144  Diastolic 67 75 71  Pulse 87 88 74  O2 sat on RA today is  93%  Pertinent labs/imaging   Chemistry      Component Value Date/Time   NA 141 06/28/2020 1222   NA 139 08/16/2019 0000   K 4.3 06/28/2020 1222   CL 108 06/28/2020 1222   CO2 24 06/28/2020 1222   BUN 20 06/28/2020 1222   BUN 29 (A) 08/16/2019 0000   CREATININE 1.55 (H) 06/28/2020 1222   GLU 103 08/16/2019 0000      Component Value Date/Time   CALCIUM 9.2 06/28/2020 1222   CALCIUM 9.5 12/06/2010 0046   ALKPHOS 49 06/12/2020 2256   AST 9 (L) 06/28/2020 1222   ALT 9 06/28/2020 1222   BILITOT 0.7 06/28/2020 1222     Lab Results  Component Value Date   WBC 4.8 06/28/2020   HGB 13.7 06/28/2020   HCT 43.4 06/28/2020   MCV 89.3 06/28/2020   PLT 198 06/28/2020   Lab Results  Component Value Date   CALCIUM 9.2 06/28/2020   PHOS 4.7 (H) 12/05/2010    ASSESSMENT/PLAN:  1) Vomiting: resolved.  Esoph stenosis/regurg-related.  He is resuming normal diet w/out return of sx's.  Continue PPI bid and f/u with GI 5d.  2) Cough: suspected to be GER/LPR related, expect to improve gradually as the injured mucosa recovers.  Try cutting tessalon back to bid and see how it goes. Chest imaging (XR and CT) showed no cardiopulm abnormality to explain cough. CBC today.  3) Unstable gain/residual ataxia from past CVA: acute illness has exacerbated this and we'll set him up with outpt pt, ordered today.  4) Renal transplant status, +CRI stage III.  Had AKI but resolved with IVF in hosp. Check lytes/cr today.   Continue anti-rejection regimen.  I'll check in with his nephrologist about the possibility of needing an additional covid 19 vaccine.  Medical decision making of moderate complexity was utilized today.  FOLLOW UP:  4 wks f/u GER/LPR/esophagitis/stricture/cough  Signed:  Crissie Sickles, MD           06/30/2020

## 2020-06-29 LAB — CBC WITH DIFFERENTIAL/PLATELET
Absolute Monocytes: 499 cells/uL (ref 200–950)
Basophils Absolute: 29 cells/uL (ref 0–200)
Basophils Relative: 0.6 %
Eosinophils Absolute: 82 cells/uL (ref 15–500)
Eosinophils Relative: 1.7 %
HCT: 43.4 % (ref 38.5–50.0)
Hemoglobin: 13.7 g/dL (ref 13.2–17.1)
Lymphs Abs: 518 cells/uL — ABNORMAL LOW (ref 850–3900)
MCH: 28.2 pg (ref 27.0–33.0)
MCHC: 31.6 g/dL — ABNORMAL LOW (ref 32.0–36.0)
MCV: 89.3 fL (ref 80.0–100.0)
MPV: 9.9 fL (ref 7.5–12.5)
Monocytes Relative: 10.4 %
Neutro Abs: 3672 cells/uL (ref 1500–7800)
Neutrophils Relative %: 76.5 %
Platelets: 198 10*3/uL (ref 140–400)
RBC: 4.86 10*6/uL (ref 4.20–5.80)
RDW: 13.1 % (ref 11.0–15.0)
Total Lymphocyte: 10.8 %
WBC: 4.8 10*3/uL (ref 3.8–10.8)

## 2020-06-29 LAB — COMPREHENSIVE METABOLIC PANEL
AG Ratio: 1.9 (calc) (ref 1.0–2.5)
ALT: 9 U/L (ref 9–46)
AST: 9 U/L — ABNORMAL LOW (ref 10–35)
Albumin: 3.7 g/dL (ref 3.6–5.1)
Alkaline phosphatase (APISO): 48 U/L (ref 35–144)
BUN/Creatinine Ratio: 13 (calc) (ref 6–22)
BUN: 20 mg/dL (ref 7–25)
CO2: 24 mmol/L (ref 20–32)
Calcium: 9.2 mg/dL (ref 8.6–10.3)
Chloride: 108 mmol/L (ref 98–110)
Creat: 1.55 mg/dL — ABNORMAL HIGH (ref 0.70–1.11)
Globulin: 2 g/dL (calc) (ref 1.9–3.7)
Glucose, Bld: 106 mg/dL — ABNORMAL HIGH (ref 65–99)
Potassium: 4.3 mmol/L (ref 3.5–5.3)
Sodium: 141 mmol/L (ref 135–146)
Total Bilirubin: 0.7 mg/dL (ref 0.2–1.2)
Total Protein: 5.7 g/dL — ABNORMAL LOW (ref 6.1–8.1)

## 2020-07-02 ENCOUNTER — Encounter: Payer: Self-pay | Admitting: Family Medicine

## 2020-07-02 NOTE — Telephone Encounter (Signed)
Patient had hospital f/u on 7/2 and new PT referral done.

## 2020-07-03 ENCOUNTER — Ambulatory Visit: Payer: Medicare HMO | Admitting: Physician Assistant

## 2020-07-03 ENCOUNTER — Encounter: Payer: Self-pay | Admitting: Physician Assistant

## 2020-07-03 VITALS — BP 110/60 | HR 80 | Ht 69.0 in | Wt 179.0 lb

## 2020-07-03 DIAGNOSIS — R05 Cough: Secondary | ICD-10-CM | POA: Diagnosis not present

## 2020-07-03 DIAGNOSIS — K222 Esophageal obstruction: Secondary | ICD-10-CM | POA: Diagnosis not present

## 2020-07-03 DIAGNOSIS — R1314 Dysphagia, pharyngoesophageal phase: Secondary | ICD-10-CM | POA: Diagnosis not present

## 2020-07-03 DIAGNOSIS — R053 Chronic cough: Secondary | ICD-10-CM

## 2020-07-03 MED ORDER — PANTOPRAZOLE SODIUM 40 MG PO TBEC: 40.0000 mg | DELAYED_RELEASE_TABLET | Freq: Two times a day (BID) | ORAL | 0 refills | Status: DC

## 2020-07-03 NOTE — Progress Notes (Signed)
Chief Complaint: Dysphagia  HPI:    [name] is an 84 year old Caucasian male, known to Dr. Silverio Decamp, with a past medical history as listed below including CVA on Plavix, bipolar disorder, anemia and multiple others listed below, who was referred to me by Tammi Sou, MD for a complaint of dysphagia.      Patient was recently admitted to the hospital 06/12/2020-06/14/2020 for cough, dysphagia/regurg, vomiting, ARF and dehydration.  06/14/2020 EGD with Dr. Bryan Lemma with LA grade a reflux esophagitis, benign-appearing esophageal stenosis which was fractured with cold forceps given the patient was still on anticoagulation, ectopic gastric mucosa in the upper third of the esophagus, multiple gastric polyps and otherwise normal.  Pantoprazole 40 mg twice daily was recommended for 8 weeks then decrease to once daily.  At that time as discussed if patient had ongoing symptoms of dysphagia then would recommend a 5-day Plavix hold and EGD with dilation.    Today, the patient presents to clinic and explains that he has had no further dysphagia symptoms.  He is still on his Pantoprazole 40 mg twice a day which he feels is helping with everything including his persistent cough which has decreased per his wife over the last week or so.  She explains that he continues to feel well very fatigued, but is improving every day.  They are trying to get physical therapy out to work with him a little bit.    Denies fever, chills, heartburn, reflux, nausea or vomiting.  Past Medical History:  Diagnosis Date  . Anemia   . Basal cell carcinoma    neck (skin MD 2X per year)  . Bifascicular block 2021   ectopy, asymptomatic.  Cards->observe  . Bipolar disorder ([place])   . BPH (benign prostatic hyperplasia)    with elevated PSA; followed by Dr. Rosana Hoes at [facility]  . Chronic renal insufficiency, stage III (moderate)    in pt w/hx of renal transplant.  Post-transplant sCr 1.4-1.6.  Last renal f/u 05/01/19->Cr  1.63, GFR 39 ml/min  . Coronary atherosclerosis    LM and 3 V dz noted on CT 08/2019 performed for eval of interstitial lung dz in the setting of mild DOE and mild hypoxia. Cards->no stress tesing indicated->primary prevention emphasized  . CVA (cerebral vascular accident) ([place]) 11/2016   Pontine (vertebrobasilar--imaging neg), TPA given.  Pt discharged on plavix.  Carotid dopplers ok, echocardiogram ok.  Residual deficit: vertigo but this improved greatly with therapy.  . DOE (dyspnea on exertion) 2020   DOE and ? hypoxia->CXR 08/29/19--->diffuse interstitial opacities-? acute inflammatory process->Dr. Melvyn Novas eval'd him and was underwhelmed by the CXR and exam->CT chest 09/26/19 "Very subtle areas of mild ground-glass attenuatio-nonspecific", mild air trapping. Per Dr. Melvyn Novas, no signif ILD, improving with inc ambulation (post-inflamm pulm fibrosis?)  . Esophagitis 06/14/2020   EGD->+distal esophagitis, h pylori neg, mild distal esoph stenosis widened with forceps, benign gastric polyps-->GI plans baloon dilatation in near future  . Gallbladder polyp 2015   Asymptomatic  . GERD (gastroesophageal reflux disease)   . Gout   . History of adenomatous polyp of colon 2018   Recall 5 yrs  . Hyperlipidemia   . Hypertension   . Hypothyroidism   . Lumbar spondylosis   . Ptosis due to aging    Right  . Rectal bleeding 09/2017   Admitted for obs due to Hb drop.  GI consulted---obs recommended.  No transfusion required.  Plavix was d/c'd, ferrous sulfate recommended.  Outpt GI f/u--plavix restarted and upper  endoscopy was normal and colonoscopy showed 2 polyps and severe diverticular dz. Iron d/c'd 04/24/19.  Marland Kitchen Restless legs syndrome   . S/p cadaver renal transplant 2013   Secondary to HTN +lithium toxicity over 30 yrs caused kidney destruction [facility] transplant MDs)  . Seborrheic dermatitis    eyebrows worst: Hytone rx'd by Dr. Denna Haggard.  . 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 AV FISTULA PLACEMENT  04/08/2012   Procedure: ARTERIOVENOUS (AV) FISTULA CREATION;  Surgeon: Angelia Mould, MD;  Location[facility] OR;  Service: Vascular;  Laterality: Left;  Creation of left brachial cephalic arteriovenous fistula  . BIOPSY  06/14/2020   Procedure: BIOPSY;  Surgeon: Lavena Bullion, DO;  Location: [facility];  Service: Gastroenterology;;  . CATARACT EXTRACTION, BILATERAL  05/2018  . COLONOSCOPY  2013; 11/24/17   2013; Normal except diverticulosis (recall 10 yrs).  2018 (for GI bleeding)--severe diverticular dz + 2 adenomatous polyps.  Recall 5 yrs.  . DG CHEST AP OR PA ([facility] HX)  06/03/2020   Bibasilar atelectasis. No focal consoliadation definitively identified.  . dilation for GERD    . ESOPHAGOGASTRODUODENOSCOPY  11/24/2017   gastric polyps x 2, otherwise normal.  . ESOPHAGOGASTRODUODENOSCOPY N/A 06/14/2020   +distal esophagitis, h pylori neg, mild distal esoph stenosis widened with forceps, benign gastric polypsProcedure: ESOPHAGOGASTRODUODENOSCOPY (EGD);  Surgeon: Lavena Bullion, DO;  Location[facility];  Service: Gastroenterology;  Laterality: N/A;  Diagnostic EGD as patietn last took Plavix 11am on 06/12/2020  . KIDNEY TRANSPLANT  11/06/12   [place] (cadaveric)  . PROSTATE BIOPSY  2011   no malignancy  . TRANSTHORACIC ECHOCARDIOGRAM  11/2016   Normal LV systolic fxn, EF 25-95%.  Grade I DD.  Mild aortic root dilatation, mild MV regurg.    Current Outpatient Medications  Medication Sig Dispense Refill  . acetaminophen (TYLENOL) 325 MG tablet Take 650 mg by mouth every 6 (six) hours as needed for mild pain.    Marland Kitchen allopurinol (ZYLOPRIM) 300 MG tablet Take 1 tablet by mouth once daily 90 tablet 0  . benzonatate (TESSALON) 200 MG capsule Take 1 capsule  (200 mg total) by mouth 3 (three) times daily as needed for cough. 30 capsule 2  . carvedilol (COREG) 12.5 MG tablet 1/2 tab po qAM and 1 tab po qPM (Patient taking differently: Take by mouth See admin instructions. 1/2 tab po qAM and 1/2 tab po qPM) 135 tablet 3  . clopidogrel (PLAVIX) 75 MG tablet Take 1 tablet (75 mg total) by mouth daily. Hold for 5 Days prior to repeat EGD if needed. 90 tablet 0  . EUTHYROX 25 MCG tablet Take 1 tablet by mouth once daily 90 tablet 3  . hydrOXYzine (ATARAX/VISTARIL) 10 MG tablet Take 10 mg by mouth 2 (two) times daily as needed for itching or anxiety.     . Melatonin 2.5 MG CAPS Take 2 capsules by mouth at bedtime as needed (sleep).     . mycophenolate (MYFORTIC) 180 MG EC tablet Take 360 mg by mouth 2 (two) times daily.     . ondansetron (ZOFRAN ODT) 4 MG disintegrating tablet Take 1 tablet (4 mg total) by mouth every 8 (eight) hours as needed for nausea or vomiting. (Patient not taking: Reported on 06/28/2020) 20 tablet 0  .  pantoprazole (PROTONIX) 40 MG tablet Take 1 tablet (40 mg total) by mouth 2 (two) times daily. 60 tablet 0  . risperiDONE (RISPERDAL) 1 MG tablet Take 1 mg by mouth at bedtime.     . simvastatin (ZOCOR) 20 MG tablet Take 1 tablet by mouth once daily 90 tablet 1  . sulfamethoxazole-trimethoprim (BACTRIM,SEPTRA) 400-80 MG tablet Take 1 tablet by mouth every Monday, Wednesday, and Friday.    . tacrolimus (PROGRAF) 0.5 MG capsule See admin instructions. 2 in the am and 1 at night      No current facility-administered medications for this visit.    Allergies as of 07/03/2020 - Review Complete 06/28/2020  Allergen Reaction Noted  . Amantadine hcl Itching 04/23/2010  . Chlorpromazine hcl  04/04/2014    Family History  Problem Relation Age of Onset  . Ulcers Mother        GI bleed   . Heart disease Father   . Bladder Cancer Father   . Colon cancer Neg Hx   . Esophageal cancer Neg Hx   . Pancreatic cancer Neg Hx   . Rectal cancer Neg  Hx   . Stomach cancer Neg Hx     Social History   Socioeconomic History  . Marital status: Married    Spouse name: Not on file  . Number of children: Not on file  . Years of education: Not on file  . Highest education level: Not on file  Occupational History  . Not on file  Tobacco Use  . Smoking status: Former Smoker    Years: 16.00    Types: Pipe, Cigars    Quit date: 05/11/1974    Years since quitting: 46.1  . Smokeless tobacco: Never Used  Vaping Use  . Vaping Use: Never used  Substance and Sexual Activity  . Alcohol use: No  . Drug use: No  . Sexual activity: Not Currently    Partners: Female  Other Topics Concern  . Not on file  Social History Narrative   Married, 4 children, 9 GGC, [place].   Occupation: retired Marine scientist.  Originally from [place].   Tobacco: quit 1975; smoked pipes and cigars x 15 yrs prior to this.   Alcohol: none.   Exercise: minimal, but is going to sign up for silver sneakers at the [facility].   Social Determinants of Health   Financial Resource Strain:   . Difficulty of Paying Living Expenses:   Food Insecurity:   . Worried About Charity fundraiser in the Last Year:   . Arboriculturist in the Last Year:   Transportation Needs: No Transportation Needs  . Lack of Transportation (Medical): No  . Lack of Transportation (Non-Medical): No  Physical Activity:   . Days of Exercise per Week:   . Minutes of Exercise per Session:   Stress:   . Feeling of Stress :   Social Connections:   . Frequency of Communication with Friends and Family:   . Frequency of Social Gatherings with Friends and Family:   . Attends Religious Services:   . Active Member of Clubs or Organizations:   . Attends Archivist Meetings:   Marland Kitchen Marital Status:   Intimate Partner Violence:   . Fear of Current or Ex-Partner:   . Emotionally Abused:   Marland Kitchen Physically Abused:   . Sexually Abused:     Review of Systems:    Constitutional: No weight loss, fever or  chills Cardiovascular: No chest pain Respiratory: No SOB  Gastrointestinal: See HPI and otherwise negative   Physical Exam:  Vital signs: BP 110/60   Pulse 80   Ht 5\' 9"  (1.753 m)   Wt 179 lb (81.2 kg)   BMI 26.43 kg/m   Constitutional:   Pleasant Caucasian male appears to be in NAD, Well developed, Well nourished, alert and cooperative Respiratory: Respirations even and unlabored. Lungs clear to auscultation bilaterally.   No wheezes, crackles, or rhonchi.  Cardiovascular: Normal S1, S2. No MRG. Regular rate and rhythm. No peripheral edema, cyanosis or pallor.  Gastrointestinal:  Soft, nondistended, nontender. No rebound or guarding. Normal bowel sounds. No appreciable masses or hepatomegaly. Rectal:  Not performed.  Psychiatric: Demonstrates good judgement and reason without abnormal affect or behaviors.  RELEVANT LABS AND IMAGING: CBC    Component Value Date/Time   WBC 4.8 06/28/2020 1222   RBC 4.86 06/28/2020 1222   HGB 13.7 06/28/2020 1222   HCT 43.4 06/28/2020 1222   PLT 198 06/28/2020 1222   MCV 89.3 06/28/2020 1222   MCH 28.2 06/28/2020 1222   MCHC 31.6 (L) 06/28/2020 1222   RDW 13.1 06/28/2020 1222   LYMPHSABS 518 (L) 06/28/2020 1222   MONOABS 0.8 06/12/2020 2256   EOSABS 82 06/28/2020 1222   BASOSABS 29 06/28/2020 1222    CMP     Component Value Date/Time   NA 141 06/28/2020 1222   NA 139 08/16/2019 0000   K 4.3 06/28/2020 1222   CL 108 06/28/2020 1222   CO2 24 06/28/2020 1222   GLUCOSE 106 (H) 06/28/2020 1222   GLUCOSE 103 (H) 12/15/2006 1045   BUN 20 06/28/2020 1222   BUN 29 (A) 08/16/2019 0000   CREATININE 1.55 (H) 06/28/2020 1222   CALCIUM 9.2 06/28/2020 1222   CALCIUM 9.5 12/06/2010 0046   PROT 5.7 (L) 06/28/2020 1222   ALBUMIN 3.7 06/12/2020 2256   AST 9 (L) 06/28/2020 1222   ALT 9 06/28/2020 1222   ALKPHOS 49 06/12/2020 2256   BILITOT 0.7 06/28/2020 1222   GFRNONAA 42 (L) 06/14/2020 0326   GFRAA 48 (L) 06/14/2020 0326    Assessment: 1.   Dysphagia: Status post EGD recently in the hospital with finding of peptic stricture which was fractured with cold forceps given the patient was still on his anticoagulation, no further dysphagia symptoms since then 2.  Persistent cough: Apparently was told this is related to reflux, this has been getting some better now that he is on pantoprazole twice daily  Plan: 1.  Reviewed recent EGD report with the patient and his wife.  Recommendations are to stay on his Pantoprazole 40 mg twice daily for the next month and then decrease to once daily dosing.  Provided the patient with a 90-day prescription per his request.  He will call when he goes down to once daily dosing and we can send in a new prescription. 2.  Reviewed antidysphagia measures. 3.  Provided reassurance, as long as the patient is getting stronger and better every day than this is what we like to see. 4.  Patient to follow in clinic with [place] as needed in the near future.  Ellouise Newer, PA-C [facility] 07/03/2020, 1:17 PM  Cc: McGowen, Adrian Blackwater, MD

## 2020-07-03 NOTE — Patient Instructions (Signed)
If you are age 82 or older, your body mass index should be between 23-30. Your Body mass index is 26.43 kg/m. If this is out of the aforementioned range listed, please consider follow up with your Primary Care Provider.  If you are age 35 or younger, your body mass index should be between 19-25. Your Body mass index is 26.43 kg/m. If this is out of the aformentioned range listed, please consider follow up with your Primary Care Provider.   We have sent the following medications to your pharmacy for you to pick up at your convenience: Pantoprazole 40 mg twice daily 30-60 minutes before breakfast and dinner.   Please call when you run out of Pantoprazole and we will refill for once daily, ask for [facility], CMA.   Follow up as needed.

## 2020-07-09 ENCOUNTER — Telehealth: Payer: Self-pay | Admitting: Family Medicine

## 2020-07-09 NOTE — Telephone Encounter (Signed)
Patient returned call and advised of recommendations. °

## 2020-07-09 NOTE — Telephone Encounter (Signed)
LM for pt to returncall

## 2020-07-09 NOTE — Telephone Encounter (Signed)
Pls notify pt and tell him that I communicated with Dr. Moshe Cipro, his nephrologist, and she said he should not get another covid vaccine at this time.--thx

## 2020-07-17 ENCOUNTER — Ambulatory Visit: Payer: Medicare HMO | Admitting: Physical Therapy

## 2020-07-22 ENCOUNTER — Other Ambulatory Visit: Payer: Self-pay

## 2020-07-22 ENCOUNTER — Ambulatory Visit (INDEPENDENT_AMBULATORY_CARE_PROVIDER_SITE_OTHER): Payer: Medicare HMO | Admitting: Family Medicine

## 2020-07-22 ENCOUNTER — Encounter: Payer: Self-pay | Admitting: Family Medicine

## 2020-07-22 VITALS — BP 114/67 | HR 81 | Temp 98.1°F | Resp 16 | Ht 69.0 in | Wt 179.2 lb

## 2020-07-22 DIAGNOSIS — R05 Cough: Secondary | ICD-10-CM

## 2020-07-22 DIAGNOSIS — Z940 Kidney transplant status: Secondary | ICD-10-CM | POA: Diagnosis not present

## 2020-07-22 DIAGNOSIS — E7800 Pure hypercholesterolemia, unspecified: Secondary | ICD-10-CM

## 2020-07-22 DIAGNOSIS — N183 Chronic kidney disease, stage 3 unspecified: Secondary | ICD-10-CM | POA: Diagnosis not present

## 2020-07-22 DIAGNOSIS — E039 Hypothyroidism, unspecified: Secondary | ICD-10-CM

## 2020-07-22 DIAGNOSIS — R059 Cough, unspecified: Secondary | ICD-10-CM

## 2020-07-22 DIAGNOSIS — I10 Essential (primary) hypertension: Secondary | ICD-10-CM

## 2020-07-22 LAB — LIPID PANEL
Cholesterol: 126 mg/dL (ref 0–200)
HDL: 37.2 mg/dL — ABNORMAL LOW (ref >39.00)
LDL Cholesterol: 65 mg/dL (ref 0–99)
NonHDL: 88.93
Total CHOL/HDL Ratio: 3
Triglycerides: 120 mg/dL (ref 0.0–149.0)
VLDL: 24 mg/dL (ref 0.0–40.0)

## 2020-07-22 LAB — TSH: TSH: 0.42 u[IU]/mL (ref 0.35–4.50)

## 2020-07-22 NOTE — Progress Notes (Signed)
OFFICE VISIT  07/22/2020   CC:  Chief Complaint  Patient presents with  . Follow-up    RCI, pt is fasting   HPI:    Patient is a 84 y.o. Caucasian male who presents for f/u HTN, CRI III in the setting of hx of renal transplant, HLD, hypothyroidism. No trouble swallowing food, is able to take his pills with applesauce. Doing fine.  Still with nonproductive cough but this continues to improve.  Taste impaired still.  Benzonatate not much help. Taking pantoprazole 40mg  bid still.  Cough worse in cold environment and worse when he lies down. No SOB, no CP.    HTN: occ bp check 115/60s.  Coreg at 1/2 of 12.5mg  bid. HR low 80s.  HLD: tolerating statin.  Hypoth: taking synthroid on empty stomach w/out any other meds.  CRI: avoids NSAIDs.  Hydrating well.  CT chest w/out contrast 06/13/20: IMPRESSION: 1. No acute intrathoracic pathology to explain the patient's symptoms. 2. Coronary artery calcifications. 3. Slightly hyperdense 3.7 cm lesion within the right native kidney which could represent a proteinaceous or hemorrhagic cyst, however cannot exclude a solid renal lesion. Would recommend renal ultrasound for further evaluation. 4. Diverticulosis without diverticulitis. 5.  Aortic Atherosclerosis (ICD10-I70.0). 6. Prostatomegaly  F/u renal ultrasound 06/13/20 -->the R kidney lesion was found to be a cyst.  Past Medical History:  Diagnosis Date  . Basal cell carcinoma    neck (skin MD 2X per year)  . Bifascicular block 2021   ectopy, asymptomatic.  Cards->observe  . Bipolar disorder ([place])   . BPH (benign prostatic hyperplasia)    with elevated PSA; followed by Dr. Rosana Hoes at [facility]  . Chronic renal insufficiency, stage III (moderate)    in pt w/hx of renal transplant.  Post-transplant sCr 1.4-1.6.  Last renal f/u 05/01/19->Cr 1.63, GFR 39 ml/min  . Coronary atherosclerosis    LM and 3 V dz noted on CT 08/2019 performed for eval of interstitial lung dz in the setting  of mild DOE and mild hypoxia. Cards->no stress tesing indicated->primary prevention emphasized  . CVA (cerebral vascular accident) ([place]) 11/2016   Pontine (vertebrobasilar--imaging neg), TPA given.  Pt discharged on plavix.  Carotid dopplers ok, echocardiogram ok.  Residual deficit: vertigo but this improved greatly with therapy.  . DOE (dyspnea on exertion) 2020   DOE and ? hypoxia->CXR 08/29/19--->diffuse interstitial opacities-? acute inflammatory process->Dr. Melvyn Novas eval'd him and was underwhelmed by the CXR and exam->CT chest 09/26/19 "Very subtle areas of mild ground-glass attenuatio-nonspecific", mild air trapping. Per Dr. Melvyn Novas, no signif ILD, improving with inc ambulation (post-inflamm pulm fibrosis?)  . Esophagitis 06/14/2020   EGD->+distal esophagitis, h pylori neg, mild distal esoph stenosis widened with forceps, benign gastric polyps.  . Gallbladder polyp 2015   Asymptomatic  . GERD (gastroesophageal reflux disease)   . Gout   . History of adenomatous polyp of colon 2018   Recall 5 yrs  . Hyperlipidemia   . Hypertension   . Hypothyroidism   . Lumbar spondylosis   . Ptosis due to aging    Right  . Rectal bleeding 09/2017   Admitted for obs due to Hb drop.  GI consulted---obs recommended.  No transfusion required.  Plavix was d/c'd, ferrous sulfate recommended.  Outpt GI f/u--plavix restarted and upper endoscopy was normal and colonoscopy showed 2 polyps and severe diverticular dz. Iron d/c'd 04/24/19.  Marland Kitchen Restless legs syndrome   . S/p cadaver renal transplant 2013   Secondary to HTN +lithium toxicity over 30 yrs  caused kidney destruction [facility] transplant MDs)  . Seborrheic dermatitis    eyebrows worst: Hytone rx'd by Dr. Denna Haggard.  . 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 AV FISTULA PLACEMENT  04/08/2012   Procedure: ARTERIOVENOUS (AV) FISTULA CREATION;  Surgeon: Angelia Mould, MD;  Location: [facility] OR;  Service: Vascular;  Laterality: Left;  Creation of left brachial cephalic arteriovenous fistula  . BIOPSY  06/14/2020   Procedure: BIOPSY;  Surgeon: Lavena Bullion, DO;  Location: [facility];  Service: Gastroenterology;;  . CATARACT EXTRACTION, BILATERAL  05/2018  . COLONOSCOPY  2013; 11/24/17   2013; Normal except diverticulosis (recall 10 yrs).  2018 (for GI bleeding)--severe diverticular dz + 2 adenomatous polyps.  Recall 5 yrs.  . DG CHEST AP OR PA ([facility] HX)  06/03/2020   Bibasilar atelectasis. No focal consoliadation definitively identified.  . dilation for GERD    . ESOPHAGOGASTRODUODENOSCOPY  11/24/2017   gastric polyps x 2, otherwise normal.  . ESOPHAGOGASTRODUODENOSCOPY N/A 06/14/2020   +distal esophagitis, h pylori neg, mild distal esoph stenosis widened with forceps, benign gastric polypsProcedure: ESOPHAGOGASTRODUODENOSCOPY (EGD);  Surgeon: Lavena Bullion, DO;  Location: [facility];  Service: Gastroenterology;  Laterality: N/A;  Diagnostic EGD as patietn last took Plavix 11am on 06/12/2020  . KIDNEY TRANSPLANT  11/06/12   [place] (cadaveric)  . PROSTATE BIOPSY  2011   no malignancy  . TRANSTHORACIC ECHOCARDIOGRAM  11/2016   Normal LV systolic fxn, EF 28-41%.  Grade I DD.  Mild aortic root dilatation, mild MV regurg.    Outpatient Medications Prior to Visit  Medication Sig Dispense Refill  . acetaminophen (TYLENOL) 325 MG tablet Take 650 mg by mouth every 6 (six) hours as needed for mild pain.    Marland Kitchen allopurinol (ZYLOPRIM) 300 MG tablet Take 1 tablet by mouth once daily 90 tablet 0  . benzonatate (TESSALON) 200 MG capsule Take 1 capsule (200 mg total) by mouth 3 (three) times daily as needed for cough. (Patient taking differently: Take 200 mg by mouth 2 (two) times daily as needed for cough. ) 30 capsule 2  .  carvedilol (COREG) 12.5 MG tablet 1/2 tab po qAM and 1 tab po qPM (Patient taking differently: Take by mouth See admin instructions. 1/2 tab po qAM and 1/2 tab po qPM) 135 tablet 3  . clopidogrel (PLAVIX) 75 MG tablet Take 1 tablet (75 mg total) by mouth daily. Hold for 5 Days prior to repeat EGD if needed. 90 tablet 0  . EUTHYROX 25 MCG tablet Take 1 tablet by mouth once daily 90 tablet 3  . hydrOXYzine (ATARAX/VISTARIL) 10 MG tablet Take 10 mg by mouth 2 (two) times daily as needed for itching or anxiety.     . Melatonin 2.5 MG CAPS Take 2 capsules by mouth at bedtime as needed (sleep).     . mycophenolate (MYFORTIC) 180 MG EC tablet Take 360 mg by mouth 2 (two) times daily.     . pantoprazole (PROTONIX) 40 MG tablet Take 1 tablet (40 mg total) by mouth 2 (two) times daily. 180 tablet 0  . risperiDONE (RISPERDAL) 1 MG tablet Take 1 mg by mouth at bedtime.     . simvastatin (ZOCOR) 20 MG tablet Take 1 tablet by mouth once daily 90 tablet 1  .  sulfamethoxazole-trimethoprim (BACTRIM,SEPTRA) 400-80 MG tablet Take 1 tablet by mouth every Monday, Wednesday, and Friday.    . tacrolimus (PROGRAF) 0.5 MG capsule See admin instructions. 2 in the am and 1 at night      No facility-administered medications prior to visit.    Allergies  Allergen Reactions  . Amantadine Hcl Itching    REACTION: itching  . Chlorpromazine Hcl     REACTION: "fell flat on face"    ROS As per HPI  PE: Vitals with BMI 07/22/2020 07/03/2020 06/28/2020  Height 5\' 9"  5\' 9"  5\' 9"   Weight 179 lbs 3 oz 179 lbs 178 lbs  BMI 26.45 00.93 81.82  Systolic 993 716 967  Diastolic 67 60 67  Pulse 81 80 87  O2 sat on RA today is 94%  Gen: Alert, well appearing.  Patient is oriented to person, place, time, and situation. AFFECT: pleasant, lucid thought and speech. CV: RRR, no m/r/g.   LUNGS: CTA bilat, nonlabored resps, good aeration in all lung fields. EXT: no clubbing or cyanosis.  no edema.    LABS:  Lab Results  Component  Value Date   TSH 0.98 01/23/2020   Lab Results  Component Value Date   WBC 4.8 06/28/2020   HGB 13.7 06/28/2020   HCT 43.4 06/28/2020   MCV 89.3 06/28/2020   PLT 198 06/28/2020   Lab Results  Component Value Date   CREATININE 1.55 (H) 06/28/2020   BUN 20 06/28/2020   NA 141 06/28/2020   K 4.3 06/28/2020   CL 108 06/28/2020   CO2 24 06/28/2020   Lab Results  Component Value Date   ALT 9 06/28/2020   AST 9 (L) 06/28/2020   ALKPHOS 49 06/12/2020   BILITOT 0.7 06/28/2020   Lab Results  Component Value Date   CHOL 109 07/27/2019   Lab Results  Component Value Date   HDL 39 (L) 07/27/2019   Lab Results  Component Value Date   LDLCALC 56 07/27/2019   Lab Results  Component Value Date   TRIG 68 07/27/2019   Lab Results  Component Value Date   CHOLHDL 2.8 07/27/2019   Lab Results  Component Value Date   PSA 4.2 (H) 11/05/2017   PSA 3.51 03/28/2008   PSA 4.83 (H) 12/02/2007   Lab Results  Component Value Date   HGBA1C 5.9 (H) 01/23/2020    IMPRESSION AND PLAN:  1) Cough, nonproductive, gradually improving. Suspect due to signif esoph/post pharyngeal mucosal irritation from GERD. Expectant mgmt.  Fortunately, EGD and dilation for his esoph stenosis has resulted in great results regarding his dysphagia. He will be decreasing his pantoprazole to qd after he finishes his current bottle of med. 2) HTN: stable. Continue 1/2 of 12.5mg  carvedilol bid. Cr stable and lytes normal 06/28/20.  3) HLD: tolerating statin. Due for FLP monitoring. Hepatic panel normal 06/28/20.  4) CRI III, hx of renal transplant: Cr stable and lytes normal 06/28/20. Cont routine f/u with nephrologist.  5) Hypothyroidism: taking synthroid correctly. TSH today.  An After Visit Summary was printed and given to the patient.  FOLLOW UP: Return in about 6 months (around 01/22/2021) for annual CPE (fasting).  Signed:  Crissie Sickles, MD           07/22/2020

## 2020-07-26 ENCOUNTER — Ambulatory Visit: Payer: Medicare HMO | Admitting: Family Medicine

## 2020-07-29 ENCOUNTER — Encounter: Payer: Self-pay | Admitting: Physical Therapy

## 2020-07-29 ENCOUNTER — Other Ambulatory Visit: Payer: Self-pay

## 2020-07-29 ENCOUNTER — Ambulatory Visit (INDEPENDENT_AMBULATORY_CARE_PROVIDER_SITE_OTHER): Payer: Medicare HMO | Admitting: Physical Therapy

## 2020-07-29 DIAGNOSIS — R2689 Other abnormalities of gait and mobility: Secondary | ICD-10-CM

## 2020-07-29 DIAGNOSIS — M6281 Muscle weakness (generalized): Secondary | ICD-10-CM

## 2020-07-29 DIAGNOSIS — R69 Illness, unspecified: Secondary | ICD-10-CM | POA: Diagnosis not present

## 2020-07-29 NOTE — Therapy (Signed)
[place], [place], [address] Phone: [phone]   Fax:  [phone]  Physical Therapy Evaluation  Patient Details  Name: Curtis Jimenez MRN: [id number] Date of Birth: [birth year] Referring Provider (PT): McGowen   Encounter Date: 07/29/2020   PT End of Session - 07/29/20 1145    Visit Number 1    Number of Visits 12    Date for PT Re-Evaluation 09/09/20    Authorization Type [employer]    PT Start Time 1103    PT Stop Time 1142    PT Time Calculation (min) 39 min    Equipment Utilized During Treatment Gait belt    Activity Tolerance Patient tolerated treatment well    Behavior During Therapy WFL for tasks assessed/performed           Past Medical History:  Diagnosis Date  . Basal cell carcinoma    neck (skin MD 2X per year)  . Bifascicular block 2021   ectopy, asymptomatic.  Cards->observe  . Bipolar disorder ([place])   . BPH (benign prostatic hyperplasia)    with elevated PSA; followed by Dr. Rosana Hoes at [facility]  . Chronic renal insufficiency, stage III (moderate)    in pt w/hx of renal transplant.  Post-transplant sCr 1.4-1.6.  Last renal f/u 05/01/19->Cr 1.63, GFR 39 ml/min  . Coronary atherosclerosis    LM and 3 V dz noted on CT 08/2019 performed for eval of interstitial lung dz in the setting of mild DOE and mild hypoxia. Cards->no stress tesing indicated->primary prevention emphasized  . CVA (cerebral vascular accident) ([place]) 11/2016   Pontine (vertebrobasilar--imaging neg), TPA given.  Pt discharged on plavix.  Carotid dopplers ok, echocardiogram ok.  Residual deficit: vertigo but this improved greatly with therapy.  . DOE (dyspnea on exertion) 2020   DOE and ? hypoxia->CXR 08/29/19--->diffuse interstitial opacities-? acute inflammatory process->Dr. Melvyn Novas eval'd him and was underwhelmed by the CXR and exam->CT chest 09/26/19 "Very subtle areas of mild ground-glass attenuatio-nonspecific", mild air  trapping. Per Dr. Melvyn Novas, no signif ILD, improving with inc ambulation (post-inflamm pulm fibrosis?)  . Esophagitis 06/14/2020   EGD->+distal esophagitis, h pylori neg, mild distal esoph stenosis widened with forceps, benign gastric polyps.  . Gallbladder polyp 2015   Asymptomatic  . GERD (gastroesophageal reflux disease)   . Gout   . History of adenomatous polyp of colon 2018   Recall 5 yrs  . Hyperlipidemia   . Hypertension   . Hypothyroidism   . Lumbar spondylosis   . Ptosis due to aging    Right  . Rectal bleeding 09/2017   Admitted for obs due to Hb drop.  GI consulted---obs recommended.  No transfusion required.  Plavix was d/c'd, ferrous sulfate recommended.  Outpt GI f/u--plavix restarted and upper endoscopy was normal and colonoscopy showed 2 polyps and severe diverticular dz. Iron d/c'd 04/24/19.  Marland Kitchen Restless legs syndrome   . S/p cadaver renal transplant 2013   Secondary to HTN +lithium toxicity over 30 yrs caused kidney destruction [facility] transplant MDs)  . Seborrheic dermatitis    eyebrows worst: Hytone rx'd by Dr. Denna Haggard.  . 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 AV FISTULA PLACEMENT  04/08/2012   Procedure: ARTERIOVENOUS (AV) FISTULA CREATION;  Surgeon: Angelia Mould,  MD;  Location: [facility] OR;  Service: Vascular;  Laterality: Left;  Creation of left brachial cephalic arteriovenous fistula  . BIOPSY  06/14/2020   Procedure: BIOPSY;  Surgeon: Lavena Bullion, DO;  [facility];  Service: Gastroenterology;;  . CATARACT EXTRACTION, BILATERAL  05/2018  . COLONOSCOPY  2013; 11/24/17   2013; Normal except diverticulosis (recall 10 yrs).  2018 (for GI bleeding)--severe diverticular dz + 2 adenomatous polyps.  Recall 5 yrs.  . DG CHEST  AP OR PA ([facility] HX)  06/03/2020   Bibasilar atelectasis. No focal consoliadation definitively identified.  . dilation for GERD    . ESOPHAGOGASTRODUODENOSCOPY  11/24/2017   gastric polyps x 2, otherwise normal.  . ESOPHAGOGASTRODUODENOSCOPY N/A 06/14/2020   +distal esophagitis, h pylori neg, mild distal esoph stenosis widened with forceps, benign gastric polypsProcedure: ESOPHAGOGASTRODUODENOSCOPY (EGD);  Surgeon: Lavena Bullion, DO;  [facility];  Service: Gastroenterology;  Laterality: N/A;  Diagnostic EGD as patietn last took Plavix 11am on 06/12/2020  . KIDNEY TRANSPLANT  11/06/12   [place] (cadaveric)  . PROSTATE BIOPSY  2011   no malignancy  . TRANSTHORACIC ECHOCARDIOGRAM  11/2016   Normal LV systolic fxn, EF 29-93%.  Grade I DD.  Mild aortic root dilatation, mild MV regurg.    There were no vitals filed for this visit.    Subjective Assessment - 07/29/20 1106    Subjective Reports increased stumbling at home, has not fallen, but has bumped into things repeatedly, every 2-3 days. Has cane, but only uses when back is sore. Also has RW but does not use. Lives with wife who helps, pt does drive. Has 1 step to get in house.    Pertinent History 2013 kidney transplant, June: espphagus hospitalization and expansion, 2017 CVA,    Limitations Lifting;Standing;Walking;House hold activities    Patient Stated Goals Improved balance, less stumbling / close falls    Currently in Pain? No/denies              [facility] PT Assessment - 07/29/20 0001      Assessment   Medical Diagnosis Balance/ Falls    Referring Provider (PT) McGowen    Prior Therapy after CVA 2017      Precautions   Precautions Fall      Balance Screen   Has the patient fallen in the past 6 months No    Has the patient had a decrease in activity level because of a fear of falling?  Yes    Is the patient reluctant to leave their home because of a fear of falling?  No      Prior Function   Level of  Independence Independent      Cognition   Overall Cognitive Status Within Functional Limits for tasks assessed      ROM / Strength   AROM / PROM / Strength AROM;Strength      AROM   Overall AROM Comments Bil hips: mild limitations for flexion and rotation,  Knees: mild limitation for extension bil;        Strength   Overall Strength Comments Hips: 4-/5, Knees; 4/5;       Ambulation/Gait   Gait Comments mild decrease in step height, heavy, mid foot contact when navigating cones and with direction changes.       Balance   Balance Assessed Yes      Standardized Balance Assessment   Standardized Balance Assessment Berg Balance Test;Dynamic Gait Index      Berg Balance Test  Sit to Stand Able to stand without using hands and stabilize independently    Standing Unsupported Able to stand safely 2 minutes    Sitting with Back Unsupported but Feet Supported on Floor or Stool Able to sit safely and securely 2 minutes    Stand to Sit Sits safely with minimal use of hands    Transfers Able to transfer safely, minor use of hands    Standing Unsupported with Eyes Closed Able to stand 10 seconds with supervision    Standing Unsupported with Feet Together Able to place feet together independently but unable to hold for 30 seconds    From Standing, Reach Forward with Outstretched Arm Can reach forward >12 cm safely (5")    From Standing Position, Pick up Object from [place] to pick up shoe safely and easily    From Standing Position, Turn to Look Behind Over each Shoulder Turn sideways only but maintains balance    Turn 360 Degrees Able to turn 360 degrees safely but slowly    Standing Unsupported, Alternately Place Feet on Step/Stool Able to complete 4 steps without aid or supervision    Standing Unsupported, One Foot in Front Able to take small step independently and hold 30 seconds    Standing on One Leg Able to lift leg independently and hold equal to or more than 3 seconds    Total  Score 42    Berg comment: significant fall risk      Dynamic Gait Index   Level Surface Mild Impairment    Change in Gait Speed Mild Impairment    Gait with Horizontal Head Turns Moderate Impairment    Gait with Vertical Head Turns Mild Impairment    Gait and Pivot Turn Mild Impairment    Step Over Obstacle Mild Impairment    Step Around Obstacles Mild Impairment    Steps Mild Impairment    Total Score 15    DGI comment: fall risk                      Objective measurements completed on examination: See above findings.       [facility] Adult PT Treatment/Exercise - 07/29/20 0001      Exercises   Exercises Knee/Hip      Knee/Hip Exercises: Standing   Heel Raises 20 reps    Hip Flexion Knee bent;20 reps      Knee/Hip Exercises: Seated   Long Arc Quad 15 reps;Both    Marching 20 reps;Both                  PT Education - 07/29/20 1144    Education Details PT POC, Exam findings, initial HEP for LE strength    Person(s) Educated Patient    Methods Explanation;Demonstration;Handout;Verbal cues    Comprehension Verbalized understanding;Returned demonstration;Verbal cues required;Need further instruction            PT Short Term Goals - 07/29/20 1147      PT SHORT TERM GOAL #1   Title Pt to be independent with intial HEP    Time 2    Period Weeks    Status New    Target Date 08/12/20      PT SHORT TERM GOAL #2   Title Pt to demo ability for stand/wait after sit to stand, to unsure no dizziness    Time 2    Period Weeks    Status New    Target Date 08/12/20  PT Long Term Goals - 07/29/20 1148      PT LONG TERM GOAL #1   Title Pt to be independent with final HEP    Time 6    Period Weeks    Status New    Target Date 09/09/20      PT LONG TERM GOAL #2   Title Pt to demo improved score on BERG by at least 5 points, for improved balance and fall risk.    Time 6    Period Weeks    Status New    Target Date 09/09/20      PT  LONG TERM GOAL #3   Title Pt to demo improved score on DGI by at least 5 points    Time 6    Period Weeks    Status New    Target Date 09/09/20      PT LONG TERM GOAL #4   Title Pt to independenty/safely navigate at least 5 steps with 1 hand rail.    Time 6    Period Weeks    Status New    Target Date 09/09/20      PT LONG TERM GOAL #5   Title Pt to demo ability for ambulation on uneven/outdoor surface and curb negotiation, to improve safety with community activity    Time 6    Period Weeks    Status New    Target Date 09/09/20                  Plan - 07/29/20 1159    Clinical Impression Statement Pt presents with primary complaint of decreased balance. Pt with decreased scores on BERG and DGI today, more difficulty with dynamic balance, upright orientation, and continuing optimal step height and width. Pt with weakness noted in LEs. He has decreased safety with home and community navigation, and has had several "near" falls. Pt not using AD at this time, but will likely benefit from use of SPC. Pt to benefit from skilled PT to improve deficits and pain.    Personal Factors and Comorbidities Fitness;Comorbidity 1;Comorbidity 2    Comorbidities 2013 kidney transplant, June: espphagus hospitalization and expansion, 2017 CVA,    Examination-Activity Limitations Locomotion Level;Squat;Stairs;Stand    Examination-Participation Restrictions Cleaning;Community Activity;Other    Stability/Clinical Decision Making Evolving/Moderate complexity    Clinical Decision Making Moderate    Rehab Potential Good    PT Frequency 2x / week    PT Duration 6 weeks    PT Treatment/Interventions ADLs/Self Care Home Management;Canalith Repostioning;Cryotherapy;DME Instruction;Ultrasound;Traction;Moist Heat;Iontophoresis 4mg /ml Dexamethasone;Gait training;Stair training;Functional mobility training;Therapeutic activities;Therapeutic exercise;Balance training;Neuromuscular re-education;Manual  techniques;Patient/family education;Passive range of motion;Energy conservation;Joint Manipulations;Spinal Manipulations;Vestibular;Taping;Vasopneumatic Device    PT Home Exercise Plan [email or web address]    Consulted and Agree with Plan of Care Patient           Patient will benefit from skilled therapeutic intervention in order to improve the following deficits and impairments:  Abnormal gait, Decreased coordination, Difficulty walking, Decreased safety awareness, Decreased endurance, Decreased activity tolerance, Pain, Impaired flexibility, Decreased balance, Decreased knowledge of use of DME, Decreased mobility, Decreased strength  Visit Diagnosis: Other abnormalities of gait and mobility  Muscle weakness (generalized)     Problem List Patient Active Problem List   Diagnosis Date Noted  . Peptic stricture of esophagus   . Emesis 06/13/2020  . Weight loss 06/13/2020  . Cough 06/13/2020  . AKI (acute kidney injury) ([place]) 06/13/2020  . Metabolic acidosis 47/42/5956  . Esophageal dysphagia   . 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 DOE (dyspnea on exertion) 08/29/2019  . Overweight (BMI 25.0-29.9) 07/27/2019  . GIB (gastrointestinal bleeding) 10/07/2017  . IFG (impaired fasting glucose) 06/08/2017  . Stroke ([place]) 11/30/2016  . Essential hypertension 01/16/2015  . S/p cadaver renal transplant 11/06/2012  . Chronic kidney disease (CKD), stage IV (severe) ([place]) 03/18/2012  . BENIGN PROSTATIC HYPERTROPHY 04/18/2008  . EXTRINSIC ASTHMA, UNSPECIFIED 12/19/2007  . PSA, INCREASED 12/09/2007  . Hypothyroidism 08/12/2007  . Bipolar disorder ([place]) 07/27/2007  . Hyperlipidemia 07/07/2007  . Gout 07/07/2007  . HYPERTENSION 07/07/2007  . GERD 07/07/2007    Lyndee Hensen, PT, DPT 1:11 PM  07/29/20    [facility] [place], [place], [address] Phone: [phone]   Fax:  [phone]  Name: Curtis Jimenez MRN:  [id number] Date of Birth: 1936/05/12

## 2020-07-29 NOTE — Patient Instructions (Signed)
Access Code: [email or web address] URL: [email or web address] Date: 07/29/2020 Prepared by: Lyndee Hensen  Exercises Seated Knee Extension AROM - 1 x daily - 2 sets - 10 reps Seated March - 1 x daily - 2 sets - 10 reps Standing March with Counter Support - 1 x daily - 2 sets - 10 reps Heel rises with counter support - 1 x daily - 2 sets - 10 reps

## 2020-07-30 NOTE — Progress Notes (Signed)
Reviewed and agree with documentation and assessment and plan. K. Veena Marvell Stavola , MD   

## 2020-07-31 ENCOUNTER — Encounter: Payer: Medicare HMO | Admitting: Physical Therapy

## 2020-08-02 ENCOUNTER — Encounter: Payer: Self-pay | Admitting: Physical Therapy

## 2020-08-02 ENCOUNTER — Encounter: Payer: Medicare HMO | Admitting: Physical Therapy

## 2020-08-02 ENCOUNTER — Other Ambulatory Visit: Payer: Self-pay

## 2020-08-02 ENCOUNTER — Ambulatory Visit: Payer: Medicare HMO | Attending: Family Medicine | Admitting: Physical Therapy

## 2020-08-02 DIAGNOSIS — M6281 Muscle weakness (generalized): Secondary | ICD-10-CM | POA: Insufficient documentation

## 2020-08-02 DIAGNOSIS — R2689 Other abnormalities of gait and mobility: Secondary | ICD-10-CM

## 2020-08-02 NOTE — Therapy (Signed)
[facility] [address], [place], [place], [address] Phone: [phone]   Fax:  [phone]  Physical Therapy Treatment  Patient Details  Name: Curtis Jimenez MRN: [id number] Date of Birth: [birth year] Referring Provider (PT): McGowen   Encounter Date: 08/02/2020   PT End of Session - 08/02/20 0759    Visit Number 2    Number of Visits 12    Date for PT Re-Evaluation 09/09/20    Authorization Type Aetna    PT Start Time 0759    PT Stop Time 0843    PT Time Calculation (min) 44 min    Activity Tolerance Patient tolerated treatment well    Behavior During Therapy [facility] for tasks assessed/performed           Past Medical History:  Diagnosis Date  . Basal cell carcinoma    neck (skin MD 2X per year)  . Bifascicular block 2021   ectopy, asymptomatic.  Cards->observe  . Bipolar disorder ([place])   . BPH (benign prostatic hyperplasia)    with elevated PSA; followed by Dr. Rosana Hoes at [facility]  . Chronic renal insufficiency, stage III (moderate)    in pt w/hx of renal transplant.  Post-transplant sCr 1.4-1.6.  Last renal f/u 05/01/19->Cr 1.63, GFR 39 ml/min  . Coronary atherosclerosis    LM and 3 V dz noted on CT 08/2019 performed for eval of interstitial lung dz in the setting of mild DOE and mild hypoxia. Cards->no stress tesing indicated->primary prevention emphasized  . CVA (cerebral vascular accident) ([place]) 11/2016   Pontine (vertebrobasilar--imaging neg), TPA given.  Pt discharged on plavix.  Carotid dopplers ok, echocardiogram ok.  Residual deficit: vertigo but this improved greatly with therapy.  . DOE (dyspnea on exertion) 2020   DOE and ? hypoxia->CXR 08/29/19--->diffuse interstitial opacities-? acute inflammatory process->Dr. Melvyn Novas eval'd him and was underwhelmed by the CXR and exam->CT chest 09/26/19 "Very subtle areas of mild ground-glass attenuatio-nonspecific", mild air trapping. Per Dr. Melvyn Novas, no signif ILD,  improving with inc ambulation (post-inflamm pulm fibrosis?)  . Esophagitis 06/14/2020   EGD->+distal esophagitis, h pylori neg, mild distal esoph stenosis widened with forceps, benign gastric polyps.  . Gallbladder polyp 2015   Asymptomatic  . GERD (gastroesophageal reflux disease)   . Gout   . History of adenomatous polyp of colon 2018   Recall 5 yrs  . Hyperlipidemia   . Hypertension   . Hypothyroidism   . Lumbar spondylosis   . Ptosis due to aging    Right  . Rectal bleeding 09/2017   Admitted for obs due to Hb drop.  GI consulted---obs recommended.  No transfusion required.  Plavix was d/c'd, ferrous sulfate recommended.  Outpt GI f/u--plavix restarted and upper endoscopy was normal and colonoscopy showed 2 polyps and severe diverticular dz. Iron d/c'd 04/24/19.  Marland Kitchen Restless legs syndrome   . S/p cadaver renal transplant 2013   Secondary to HTN +lithium toxicity over 30 yrs caused kidney destruction [facility] transplant MDs)  . Seborrheic dermatitis    eyebrows worst: Hytone rx'd by Dr. Denna Haggard.  . 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 AV FISTULA PLACEMENT  04/08/2012   Procedure: ARTERIOVENOUS (AV) FISTULA CREATION;  Surgeon: Angelia Mould, MD;  Location: [place];  Service:  Vascular;  Laterality: Left;  Creation of left brachial cephalic arteriovenous fistula  . BIOPSY  06/14/2020   Procedure: BIOPSY;  Surgeon: Lavena Bullion, DO;  [facility];  Service: Gastroenterology;;  . CATARACT EXTRACTION, BILATERAL  05/2018  . COLONOSCOPY  2013; 11/24/17   2013; Normal except diverticulosis (recall 10 yrs).  2018 (for GI bleeding)--severe diverticular dz + 2 adenomatous polyps.  Recall 5 yrs.  . DG CHEST AP OR PA ([facility] HX)  06/03/2020    Bibasilar atelectasis. No focal consoliadation definitively identified.  . dilation for GERD    . ESOPHAGOGASTRODUODENOSCOPY  11/24/2017   gastric polyps x 2, otherwise normal.  . ESOPHAGOGASTRODUODENOSCOPY N/A 06/14/2020   +distal esophagitis, h pylori neg, mild distal esoph stenosis widened with forceps, benign gastric polypsProcedure: ESOPHAGOGASTRODUODENOSCOPY (EGD);  Surgeon: Lavena Bullion, DO;  [facility];  Service: Gastroenterology;  Laterality: N/A;  Diagnostic EGD as patietn last took Plavix 11am on 06/12/2020  . KIDNEY TRANSPLANT  11/06/12   [place] (cadaveric)  . PROSTATE BIOPSY  2011   no malignancy  . TRANSTHORACIC ECHOCARDIOGRAM  11/2016   Normal LV systolic fxn, EF 17-61%.  Grade I DD.  Mild aortic root dilatation, mild MV regurg.    There were no vitals filed for this visit.   Subjective Assessment - 08/02/20 0802    Subjective No pain doing home exercises, going well.    Pertinent History 2013 kidney transplant, June: espphagus hospitalization and expansion, 2017 CVA,    Currently in Pain? No/denies    Multiple Pain Sites No                             [facility] Adult PT Treatment/Exercise - 08/02/20 0001      Neuro Re-ed    Neuro Re-ed Details  Narrow BOS 30 sec no UE, Small step forward balance Bil 30 sec 2x no UE,       Knee/Hip Exercises: Aerobic   Nustep L1 x 6 min PTA present to monitor      Knee/Hip Exercises: Standing   Hip Flexion Knee bent;20 reps    Hip Flexion Limitations 2#     Hip Abduction Stengthening;Both;1 set;10 reps;Knee straight    Abduction Limitations 2#    Hip Extension Stengthening;Both;1 set;10 reps;Knee straight    Extension Limitations 2#    Other Standing Knee Exercises Alt toe taps on step 2x 10    with and without UE.     Knee/Hip Exercises: Seated   Long Arc Quad AROM;Strengthening;Both;2 sets;10 reps    Long Arc Quad Weight --   added 2# ON SECOND SET   Clamshell with TheraBand --   Blue loop 10x    Marching 20 reps;Both    Marching Weights 2 lbs.                    PT Short Term Goals - 07/29/20 1147      PT SHORT TERM GOAL #1   Title Pt to be independent with intial HEP    Time 2    Period Weeks    Status New    Target Date 08/12/20      PT SHORT TERM GOAL #2   Title Pt to demo ability for stand/wait after sit to stand, to unsure no dizziness    Time 2    Period Weeks    Status New    Target Date 08/12/20  PT Long Term Goals - 07/29/20 1148      PT LONG TERM GOAL #1   Title Pt to be independent with final HEP    Time 6    Period Weeks    Status New    Target Date 09/09/20      PT LONG TERM GOAL #2   Title Pt to demo improved score on BERG by at least 5 points, for improved balance and fall risk.    Time 6    Period Weeks    Status New    Target Date 09/09/20      PT LONG TERM GOAL #3   Title Pt to demo improved score on DGI by at least 5 points    Time 6    Period Weeks    Status New    Target Date 09/09/20      PT LONG TERM GOAL #4   Title Pt to independenty/safely navigate at least 5 steps with 1 hand rail.    Time 6    Period Weeks    Status New    Target Date 09/09/20      PT LONG TERM GOAL #5   Title Pt to demo ability for ambulation on uneven/outdoor surface and curb negotiation, to improve safety with community activity    Time 6    Period Weeks    Status New    Target Date 09/09/20                 Plan - 08/02/20 0802    Clinical Impression Statement Pt is compliant and independent in initial HEP. Pt was able to demonstrate improvements in a few BERG balance activities today during treatment. Pt also tolerated adding 2# ankle weights to his hip and knee strengthening exercises. No pain but some mild shortness of breathe noted.    Personal Factors and Comorbidities Fitness;Comorbidity 1;Comorbidity 2    Comorbidities 2013 kidney transplant, June: espphagus hospitalization and expansion, 2017 CVA,     Examination-Activity Limitations Locomotion Level;Squat;Stairs;Stand    Examination-Participation Restrictions Cleaning;Community Activity;Other    Stability/Clinical Decision Making Evolving/Moderate complexity    Rehab Potential Good    PT Duration 6 weeks    PT Treatment/Interventions ADLs/Self Care Home Management;Canalith Repostioning;Cryotherapy;DME Instruction;Ultrasound;Traction;Moist Heat;Iontophoresis 4mg /ml Dexamethasone;Gait training;Stair training;Functional mobility training;Therapeutic activities;Therapeutic exercise;Balance training;Neuromuscular re-education;Manual techniques;Patient/family education;Passive range of motion;Energy conservation;Joint Manipulations;Spinal Manipulations;Vestibular;Taping;Vasopneumatic Device    PT Next Visit Plan [place] dynamic balance act in accordance with BERG activites, Sit to stand, cont to strengthen hips    PT Home Exercise Plan [email or web address]    Consulted and Agree with Plan of Care Patient           Patient will benefit from skilled therapeutic intervention in order to improve the following deficits and impairments:  Abnormal gait, Decreased coordination, Difficulty walking, Decreased safety awareness, Decreased endurance, Decreased activity tolerance, Pain, Impaired flexibility, Decreased balance, Decreased knowledge of use of DME, Decreased mobility, Decreased strength  Visit Diagnosis: Other abnormalities of gait and mobility  Muscle weakness (generalized)     Problem List Patient Active Problem List   Diagnosis Date Noted  . Peptic stricture of esophagus   . Emesis 06/13/2020  . Weight loss 06/13/2020  . Cough 06/13/2020  . AKI (acute kidney injury) ([place]) 06/13/2020  . Metabolic acidosis 62/37/6283  . Esophageal dysphagia   . Postinflammatory pulmonary fibrosis ([place]) 08/30/2019  . DOE (dyspnea on exertion) 08/29/2019  . Overweight (BMI 25.0-29.9) 07/27/2019  . GIB (gastrointestinal bleeding) 10/07/2017  . IFG (  impaired  fasting glucose) 06/08/2017  . Stroke ([place]) 11/30/2016  . Essential hypertension 01/16/2015  . S/p cadaver renal transplant 11/06/2012  . Chronic kidney disease (CKD), stage IV (severe) ([place]) 03/18/2012  . BENIGN PROSTATIC HYPERTROPHY 04/18/2008  . EXTRINSIC ASTHMA, UNSPECIFIED 12/19/2007  . PSA, INCREASED 12/09/2007  . Hypothyroidism 08/12/2007  . Bipolar disorder ([place]) 07/27/2007  . Hyperlipidemia 07/07/2007  . Gout 07/07/2007  . HYPERTENSION 07/07/2007  . GERD 07/07/2007    Nevyn Bossman, PTA 08/02/2020, 8:48 AM  [facility] [address] [place], [place], [address] Phone: [phone]   Fax:  [phone]  Name: Curtis Jimenez MRN: [id number] Date of Birth: [birth year]

## 2020-08-05 ENCOUNTER — Other Ambulatory Visit: Payer: Self-pay | Admitting: Family Medicine

## 2020-08-05 ENCOUNTER — Ambulatory Visit: Payer: Medicare HMO | Admitting: Physical Therapy

## 2020-08-05 ENCOUNTER — Encounter: Payer: Medicare HMO | Admitting: Physical Therapy

## 2020-08-05 ENCOUNTER — Other Ambulatory Visit: Payer: Self-pay

## 2020-08-05 ENCOUNTER — Encounter: Payer: Self-pay | Admitting: Physical Therapy

## 2020-08-05 DIAGNOSIS — M6281 Muscle weakness (generalized): Secondary | ICD-10-CM | POA: Diagnosis not present

## 2020-08-05 DIAGNOSIS — R2689 Other abnormalities of gait and mobility: Secondary | ICD-10-CM | POA: Diagnosis not present

## 2020-08-05 NOTE — Therapy (Signed)
[facility] [address], [place], [place], [address] Phone: [phone]   Fax:  [phone]  Physical Therapy Treatment  Patient Details  Name: Curtis Jimenez MRN: [id number] Date of Birth: January 28, 1936 Referring Provider (PT): McGowen   Encounter Date: 08/05/2020   PT End of Session - 08/05/20 0929    Visit Number 3    Number of Visits 12    Date for PT Re-Evaluation 09/09/20    Authorization Type Aetna    PT Start Time [phone]    PT Stop Time 1011    PT Time Calculation (min) 42 min    Activity Tolerance Patient tolerated treatment well    Behavior During Therapy [facility] for tasks assessed/performed           Past Medical History:  Diagnosis Date  . Basal cell carcinoma    neck (skin MD 2X per year)  . Bifascicular block 2021   ectopy, asymptomatic.  Cards->observe  . Bipolar disorder ([place])   . BPH (benign prostatic hyperplasia)    with elevated PSA; followed by Dr. Rosana Hoes at [facility]  . Chronic renal insufficiency, stage III (moderate)    in pt w/hx of renal transplant.  Post-transplant sCr 1.4-1.6.  Last renal f/u 05/01/19->Cr 1.63, GFR 39 ml/min  . Coronary atherosclerosis    LM and 3 V dz noted on CT 08/2019 performed for eval of interstitial lung dz in the setting of mild DOE and mild hypoxia. Cards->no stress tesing indicated->primary prevention emphasized  . CVA (cerebral vascular accident) ([place]) 11/2016   Pontine (vertebrobasilar--imaging neg), TPA given.  Pt discharged on plavix.  Carotid dopplers ok, echocardiogram ok.  Residual deficit: vertigo but this improved greatly with therapy.  . DOE (dyspnea on exertion) 2020   DOE and ? hypoxia->CXR 08/29/19--->diffuse interstitial opacities-? acute inflammatory process->Dr. Melvyn Novas eval'd him and was underwhelmed by the CXR and exam->CT chest 09/26/19 "Very subtle areas of mild ground-glass attenuatio-nonspecific", mild air trapping. Per Dr. Melvyn Novas, no signif ILD,  improving with inc ambulation (post-inflamm pulm fibrosis?)  . Esophagitis 06/14/2020   EGD->+distal esophagitis, h pylori neg, mild distal esoph stenosis widened with forceps, benign gastric polyps.  . Gallbladder polyp 2015   Asymptomatic  . GERD (gastroesophageal reflux disease)   . Gout   . History of adenomatous polyp of colon 2018   Recall 5 yrs  . Hyperlipidemia   . Hypertension   . Hypothyroidism   . Lumbar spondylosis   . Ptosis due to aging    Right  . Rectal bleeding 09/2017   Admitted for obs due to Hb drop.  GI consulted---obs recommended.  No transfusion required.  Plavix was d/c'd, ferrous sulfate recommended.  Outpt GI f/u--plavix restarted and upper endoscopy was normal and colonoscopy showed 2 polyps and severe diverticular dz. Iron d/c'd 04/24/19.  Marland Kitchen Restless legs syndrome   . S/p cadaver renal transplant 2013   Secondary to HTN +lithium toxicity over 30 yrs caused kidney destruction [facility] transplant MDs)  . Seborrheic dermatitis    eyebrows worst: Hytone rx'd by Dr. Denna Haggard.  . 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 AV FISTULA PLACEMENT  04/08/2012   Procedure: ARTERIOVENOUS (AV) FISTULA CREATION;  Surgeon: Angelia Mould, MD;  Location: [place];  Service:  Vascular;  Laterality: Left;  Creation of left brachial cephalic arteriovenous fistula  . BIOPSY  06/14/2020   Procedure: BIOPSY;  Surgeon: Lavena Bullion, DO;  [facility];  Service: Gastroenterology;;  . CATARACT EXTRACTION, BILATERAL  05/2018  . COLONOSCOPY  2013; 11/24/17   2013; Normal except diverticulosis (recall 10 yrs).  2018 (for GI bleeding)--severe diverticular dz + 2 adenomatous polyps.  Recall 5 yrs.  . DG CHEST AP OR PA ([facility] HX)  06/03/2020    Bibasilar atelectasis. No focal consoliadation definitively identified.  . dilation for GERD    . ESOPHAGOGASTRODUODENOSCOPY  11/24/2017   gastric polyps x 2, otherwise normal.  . ESOPHAGOGASTRODUODENOSCOPY N/A 06/14/2020   +distal esophagitis, h pylori neg, mild distal esoph stenosis widened with forceps, benign gastric polypsProcedure: ESOPHAGOGASTRODUODENOSCOPY (EGD);  Surgeon: Lavena Bullion, DO;  [facility];  Service: Gastroenterology;  Laterality: N/A;  Diagnostic EGD as patietn last took Plavix 11am on 06/12/2020  . KIDNEY TRANSPLANT  11/06/12   [place] (cadaveric)  . PROSTATE BIOPSY  2011   no malignancy  . TRANSTHORACIC ECHOCARDIOGRAM  11/2016   Normal LV systolic fxn, EF 37-10%.  Grade I DD.  Mild aortic root dilatation, mild MV regurg.    There were no vitals filed for this visit.   Subjective Assessment - 08/05/20 0931    Subjective Felt fine after last sesison.    Pertinent History 2013 kidney transplant, June: espphagus hospitalization and expansion, 2017 CVA,    Limitations Lifting;Standing;Walking;House hold activities    Currently in Pain? No/denies    Multiple Pain Sites No                             [facility] Adult PT Treatment/Exercise - 08/05/20 0001      Knee/Hip Exercises: Aerobic   Nustep L2 x 8 min with PTA present to monitor and review status      Knee/Hip Exercises: Machines for Strengthening   Total Gym Leg Press Seat 8: Bil 80# 3x10      Knee/Hip Exercises: Standing   Hip Flexion Knee bent;20 reps    Hip Flexion Limitations 3#    Hip Abduction Stengthening;Both;1 set;10 reps;Knee straight    Abduction Limitations 3#    Hip Extension Stengthening;Both;1 set;10 reps;Knee straight    Extension Limitations 3#    Rocker Board 1 minute    Rocker Board Limitations More confident using UE today    SLS 3x 30 sec Bil with finger tip touch    Rebounder weigth shifting 1 min 3 ways  light UE    Other Standing Knee Exercises Blue  loop side stepping with and without UE       Knee/Hip Exercises: Seated   Long Arc Quad Strengthening;Both;2 sets;10 reps;Weights    Long Arc Quad Weight 3 lbs.    Sit to Sand 2 sets;10 reps;without UE support   holding 7# wy, VC to push > balls of feet                   PT Short Term Goals - 08/05/20 0955      PT SHORT TERM GOAL #1   Title Pt to be independent with intial HEP    Time 2    Period Weeks    Status Achieved    Target Date 08/12/20             PT Long Term Goals - 07/29/20 1148  PT LONG TERM GOAL #1   Title Pt to be independent with final HEP    Time 6    Period Weeks    Status New    Target Date 09/09/20      PT LONG TERM GOAL #2   Title Pt to demo improved score on BERG by at least 5 points, for improved balance and fall risk.    Time 6    Period Weeks    Status New    Target Date 09/09/20      PT LONG TERM GOAL #3   Title Pt to demo improved score on DGI by at least 5 points    Time 6    Period Weeks    Status New    Target Date 09/09/20      PT LONG TERM GOAL #4   Title Pt to independenty/safely navigate at least 5 steps with 1 hand rail.    Time 6    Period Weeks    Status New    Target Date 09/09/20      PT LONG TERM GOAL #5   Title Pt to demo ability for ambulation on uneven/outdoor surface and curb negotiation, to improve safety with community activity    Time 6    Period Weeks    Status New    Target Date 09/09/20                 Plan - 08/05/20 0929    Clinical Impression Statement Pt tolerating all strengthening and balance exercises well. Increased his resistance today without too much difficulty. Introduced unlevel surfaces via mini ramp today with pt required light UE for confidence. Pt requires light UE for single leg stance 30 sec hold. Pt required verbal cuing to push greater through the balls of feet when performing sit to stand vs all in heels causing him to lose balance ( slightly) posteriorly.     Personal Factors and Comorbidities Fitness;Comorbidity 1;Comorbidity 2    Comorbidities 2013 kidney transplant, June: espphagus hospitalization and expansion, 2017 CVA,    Examination-Activity Limitations Locomotion Level;Squat;Stairs;Stand    Examination-Participation Restrictions Cleaning;Community Activity;Other    Stability/Clinical Decision Making Evolving/Moderate complexity    Rehab Potential Good    PT Frequency 2x / week    PT Duration 6 weeks    PT Treatment/Interventions ADLs/Self Care Home Management;Canalith Repostioning;Cryotherapy;DME Instruction;Ultrasound;Traction;Moist Heat;Iontophoresis 4mg /ml Dexamethasone;Gait training;Stair training;Functional mobility training;Therapeutic activities;Therapeutic exercise;Balance training;Neuromuscular re-education;Manual techniques;Patient/family education;Passive range of motion;Energy conservation;Joint Manipulations;Spinal Manipulations;Vestibular;Taping;Vasopneumatic Device    PT Next Visit Plan Update HEP next session    PT Home Exercise Plan [id number]    Consulted and Agree with Plan of Care Patient           Patient will benefit from skilled therapeutic intervention in order to improve the following deficits and impairments:  Abnormal gait, Decreased coordination, Difficulty walking, Decreased safety awareness, Decreased endurance, Decreased activity tolerance, Pain, Impaired flexibility, Decreased balance, Decreased knowledge of use of DME, Decreased mobility, Decreased strength  Visit Diagnosis: Other abnormalities of gait and mobility  Muscle weakness (generalized)     Problem List Patient Active Problem List   Diagnosis Date Noted  . Peptic stricture of esophagus   . Emesis 06/13/2020  . Weight loss 06/13/2020  . Cough 06/13/2020  . AKI (acute kidney injury) ([place]) 06/13/2020  . Metabolic acidosis 40/98/1191  . Esophageal dysphagia   . 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 DOE (dyspnea on  exertion) 08/29/2019  . Overweight (BMI 25.0-29.9) 07/27/2019  .  GIB (gastrointestinal bleeding) 10/07/2017  . IFG (impaired fasting glucose) 06/08/2017  . Stroke ([place]) 11/30/2016  . Essential hypertension 01/16/2015  . S/p cadaver renal transplant 11/06/2012  . Chronic kidney disease (CKD), stage IV (severe) ([place]) 03/18/2012  . BENIGN PROSTATIC HYPERTROPHY 04/18/2008  . EXTRINSIC ASTHMA, UNSPECIFIED 12/19/2007  . PSA, INCREASED 12/09/2007  . Hypothyroidism 08/12/2007  . Bipolar disorder ([place]) 07/27/2007  . Hyperlipidemia 07/07/2007  . Gout 07/07/2007  . HYPERTENSION 07/07/2007  . GERD 07/07/2007    Clydean Posas, PTA 08/05/2020, 10:11 AM  [facility] [address] [place], [place], [address] Phone: [phone]   Fax:  [phone]  Name: PADRAIC MARINOS MRN: [id number] Date of Birth: 23-Aug-1936

## 2020-08-07 ENCOUNTER — Encounter: Payer: Medicare HMO | Admitting: Physical Therapy

## 2020-08-09 ENCOUNTER — Encounter: Payer: Self-pay | Admitting: Physical Therapy

## 2020-08-09 ENCOUNTER — Other Ambulatory Visit: Payer: Self-pay

## 2020-08-09 ENCOUNTER — Ambulatory Visit: Payer: Medicare HMO | Admitting: Physical Therapy

## 2020-08-09 DIAGNOSIS — M6281 Muscle weakness (generalized): Secondary | ICD-10-CM

## 2020-08-09 DIAGNOSIS — R2689 Other abnormalities of gait and mobility: Secondary | ICD-10-CM

## 2020-08-09 NOTE — Therapy (Signed)
[facility] [address] [place], [place], [address] Phone: [phone]   Fax:  [phone]  Physical Therapy Treatment  Patient Details  Name: Curtis Jimenez MRN: [id number] Date of Birth: 27-Jun-1936 Referring Provider (PT): McGowen   Encounter Date: 08/09/2020   PT End of Session - 08/09/20 1054    Visit Number 4    Number of Visits 12    Date for PT Re-Evaluation 09/09/20    Authorization Type Aetna    PT Start Time 1017    PT Stop Time 1105    PT Time Calculation (min) 48 min    Activity Tolerance Patient tolerated treatment well    Behavior During Therapy [facility] for tasks assessed/performed           Past Medical History:  Diagnosis Date  . Basal cell carcinoma    neck (skin MD 2X per year)  . Bifascicular block 2021   ectopy, asymptomatic.  Cards->observe  . Bipolar disorder ([place])   . BPH (benign prostatic hyperplasia)    with elevated PSA; followed by Dr. Rosana Hoes at [facility]  . Chronic renal insufficiency, stage III (moderate)    in pt w/hx of renal transplant.  Post-transplant sCr 1.4-1.6.  Last renal f/u 05/01/19->Cr 1.63, GFR 39 ml/min  . Coronary atherosclerosis    LM and 3 V dz noted on CT 08/2019 performed for eval of interstitial lung dz in the setting of mild DOE and mild hypoxia. Cards->no stress tesing indicated->primary prevention emphasized  . CVA (cerebral vascular accident) ([place]) 11/2016   Pontine (vertebrobasilar--imaging neg), TPA given.  Pt discharged on plavix.  Carotid dopplers ok, echocardiogram ok.  Residual deficit: vertigo but this improved greatly with therapy.  . DOE (dyspnea on exertion) 2020   DOE and ? hypoxia->CXR 08/29/19--->diffuse interstitial opacities-? acute inflammatory process->Dr. Melvyn Novas eval'd him and was underwhelmed by the CXR and exam->CT chest 09/26/19 "Very subtle areas of mild ground-glass attenuatio-nonspecific", mild air trapping. Per Dr. Melvyn Novas, no signif  ILD, improving with inc ambulation (post-inflamm pulm fibrosis?)  . Esophagitis 06/14/2020   EGD->+distal esophagitis, h pylori neg, mild distal esoph stenosis widened with forceps, benign gastric polyps.  . Gallbladder polyp 2015   Asymptomatic  . GERD (gastroesophageal reflux disease)   . Gout   . History of adenomatous polyp of colon 2018   Recall 5 yrs  . Hyperlipidemia   . Hypertension   . Hypothyroidism   . Lumbar spondylosis   . Ptosis due to aging    Right  . Rectal bleeding 09/2017   Admitted for obs due to Hb drop.  GI consulted---obs recommended.  No transfusion required.  Plavix was d/c'd, ferrous sulfate recommended.  Outpt GI f/u--plavix restarted and upper endoscopy was normal and colonoscopy showed 2 polyps and severe diverticular dz. Iron d/c'd 04/24/19.  Marland Kitchen Restless legs syndrome   . S/p cadaver renal transplant 2013   Secondary to HTN +lithium toxicity over 30 yrs caused kidney destruction [facility] transplant MDs)  . Seborrheic dermatitis    eyebrows worst: Hytone rx'd by Dr. Denna Haggard.  . 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 AV FISTULA PLACEMENT  04/08/2012   Procedure: ARTERIOVENOUS (AV) FISTULA CREATION;  Surgeon: Angelia Mould, MD;  Location: [place];  Service:  Vascular;  Laterality: Left;  Creation of left brachial cephalic arteriovenous fistula  . BIOPSY  06/14/2020   Procedure: BIOPSY;  Surgeon: Lavena Bullion, DO;  [facility];  Service: Gastroenterology;;  . CATARACT EXTRACTION, BILATERAL  05/2018  . COLONOSCOPY  2013; 11/24/17   2013; Normal except diverticulosis (recall 10 yrs).  2018 (for GI bleeding)--severe diverticular dz + 2 adenomatous polyps.  Recall 5 yrs.  . DG CHEST AP OR PA ([facility] HX)  06/03/2020    Bibasilar atelectasis. No focal consoliadation definitively identified.  . dilation for GERD    . ESOPHAGOGASTRODUODENOSCOPY  11/24/2017   gastric polyps x 2, otherwise normal.  . ESOPHAGOGASTRODUODENOSCOPY N/A 06/14/2020   +distal esophagitis, h pylori neg, mild distal esoph stenosis widened with forceps, benign gastric polypsProcedure: ESOPHAGOGASTRODUODENOSCOPY (EGD);  Surgeon: Lavena Bullion, DO;  [facility];  Service: Gastroenterology;  Laterality: N/A;  Diagnostic EGD as patietn last took Plavix 11am on 06/12/2020  . KIDNEY TRANSPLANT  11/06/12   [place] (cadaveric)  . PROSTATE BIOPSY  2011   no malignancy  . TRANSTHORACIC ECHOCARDIOGRAM  11/2016   Normal LV systolic fxn, EF 16-10%.  Grade I DD.  Mild aortic root dilatation, mild MV regurg.    There were no vitals filed for this visit.   Subjective Assessment - 08/09/20 1021    Subjective I have some low back soreness since last session's exercises.    Pertinent History 2013 kidney transplant, June: espphagus hospitalization and expansion, 2017 CVA,    Limitations Lifting;Standing;Walking;House hold activities    Patient Stated Goals Improved balance, less stumbling / close falls    Currently in Pain? No/denies                             [facility] Adult PT Treatment/Exercise - 08/09/20 0001      Exercises   Exercises Lumbar;Knee/Hip      Lumbar Exercises: Aerobic   Recumbent Bike L2 x 6' with hot pack for lumbar spine      Knee/Hip Exercises: Standing   Hip Flexion Stengthening;Both;20 reps;Knee bent    Hip Flexion Limitations 2    Hip Abduction Stengthening;Both;1 set;5 reps;Knee straight    Abduction Limitations 2    Hip Extension Stengthening;Both;1 set;5 reps;Knee straight    Extension Limitations 2    Other Standing Knee Exercises resisted walking 25lb backward only x 5, Close supervision      Knee/Hip Exercises: Seated   Long Arc Quad Strengthening;Both;10 reps;2 sets    Long Arc  Quad Weight 3 lbs.    Long [employer] Limitations seated on black pad in chair    Marching Strengthening;Both;20 reps;Weights    Marching Limitations seated on black pad in chair    Marching Weights 3 lbs.    Sit to Sand 3 sets;5 reps;without UE support   holding 10lb plate at chest, from black pad     Modalities   Modalities Moist Heat      Moist Heat Therapy   Number Minutes Moist Heat 10 Minutes    Moist Heat Location Lumbar Spine   with bike warm up and end of session supine              Balance Exercises - 08/09/20 0001      Balance Exercises: Standing   Stepping Strategy Anterior;Lateral   over floor ladder from single square back/forth x 10 reps ea   Step Over Hurdles / Cones over pool noodle  and foam roller x 3 laps back/forth, close supervision, PT cued high knee and heel strike    Marching Forwards   floor ladder x 2 laps back/forth, close supervision              PT Short Term Goals - 08/05/20 0955      PT SHORT TERM GOAL #1   Title Pt to be independent with intial HEP    Time 2    Period Weeks    Status Achieved    Target Date 08/12/20             PT Long Term Goals - 07/29/20 1148      PT LONG TERM GOAL #1   Title Pt to be independent with final HEP    Time 6    Period Weeks    Status New    Target Date 09/09/20      PT LONG TERM GOAL #2   Title Pt to demo improved score on BERG by at least 5 points, for improved balance and fall risk.    Time 6    Period Weeks    Status New    Target Date 09/09/20      PT LONG TERM GOAL #3   Title Pt to demo improved score on DGI by at least 5 points    Time 6    Period Weeks    Status New    Target Date 09/09/20      PT LONG TERM GOAL #4   Title Pt to independenty/safely navigate at least 5 steps with 1 hand rail.    Time 6    Period Weeks    Status New    Target Date 09/09/20      PT LONG TERM GOAL #5   Title Pt to demo ability for ambulation on uneven/outdoor surface and curb negotiation,  to improve safety with community activity    Time 6    Period Weeks    Status New    Target Date 09/09/20                 Plan - 08/09/20 1055    Clinical Impression Statement Pt with report of some lumbar soreness after last session.  PT used heat during warm up and end of session to see if this helps.  PT also reduced reps and weight in standing hip ther ex in case of lumbar compensation around weak gluteals.  Pt needs VCs to use glutes more with sit to stand for fully erect posture wtih each rep.  PT added resisted walking, progressed resistance with sit to stands, and worked on stepping/gait strategies with floor ladder and step over obstacles of varying height.  Close supervision/contact guard were needed during these tasks.  Pt benefits from black pad in chair for seated ther ex.  Pt will continue to benefit from skilled PT along POC for balance, gait, stength and mobility with care being taken for history of LBP.    Comorbidities 2013 kidney transplant, June: espphagus hospitalization and expansion, 2017 CVA,    Rehab Potential Good    PT Frequency 2x / week    PT Duration 6 weeks    PT Treatment/Interventions ADLs/Self Care Home Management;Canalith Repostioning;Cryotherapy;DME Instruction;Ultrasound;Traction;Moist Heat;Iontophoresis 4mg /ml Dexamethasone;Gait training;Stair training;Functional mobility training;Therapeutic activities;Therapeutic exercise;Balance training;Neuromuscular re-education;Manual techniques;Patient/family education;Passive range of motion;Energy conservation;Joint Manipulations;Spinal Manipulations;Vestibular;Taping;Vasopneumatic Device    PT Next Visit Plan Update HEP next session, continue dynamic gait, balance, strength for LEs, did heat help lumbar  soreness last visit?    PT Home Exercise Plan [id number]    Consulted and Agree with Plan of Care Patient           Patient will benefit from skilled therapeutic intervention in order to improve the following  deficits and impairments:     Visit Diagnosis: Other abnormalities of gait and mobility  Muscle weakness (generalized)     Problem List Patient Active Problem List   Diagnosis Date Noted  . Peptic stricture of esophagus   . Emesis 06/13/2020  . Weight loss 06/13/2020  . Cough 06/13/2020  . AKI (acute kidney injury) ([place]) 06/13/2020  . Metabolic acidosis 58/30/9407  . Esophageal dysphagia   . Postinflammatory pulmonary fibrosis ([place]) 08/30/2019  . DOE (dyspnea on exertion) 08/29/2019  . Overweight (BMI 25.0-29.9) 07/27/2019  . GIB (gastrointestinal bleeding) 10/07/2017  . IFG (impaired fasting glucose) 06/08/2017  . Stroke ([place]) 11/30/2016  . Essential hypertension 01/16/2015  . S/p cadaver renal transplant 11/06/2012  . Chronic kidney disease (CKD), stage IV (severe) ([place]) 03/18/2012  . BENIGN PROSTATIC HYPERTROPHY 04/18/2008  . EXTRINSIC ASTHMA, UNSPECIFIED 12/19/2007  . PSA, INCREASED 12/09/2007  . Hypothyroidism 08/12/2007  . Bipolar disorder ([place]) 07/27/2007  . Hyperlipidemia 07/07/2007  . Gout 07/07/2007  . HYPERTENSION 07/07/2007  . GERD 07/07/2007    Baruch Merl, PT 08/09/20 10:59 AM   [facility] [address] [place], [place], [address] Phone: [phone]   Fax:  [phone]  Name: PRIDE GONZALES MRN: [id number] Date of Birth: March 23, 1936

## 2020-08-12 ENCOUNTER — Encounter: Payer: Self-pay | Admitting: Physical Therapy

## 2020-08-12 ENCOUNTER — Other Ambulatory Visit: Payer: Self-pay

## 2020-08-12 ENCOUNTER — Ambulatory Visit: Payer: Medicare HMO | Admitting: Physical Therapy

## 2020-08-12 ENCOUNTER — Encounter: Payer: Medicare HMO | Admitting: Physical Therapy

## 2020-08-12 DIAGNOSIS — R2689 Other abnormalities of gait and mobility: Secondary | ICD-10-CM | POA: Diagnosis not present

## 2020-08-12 DIAGNOSIS — M6281 Muscle weakness (generalized): Secondary | ICD-10-CM | POA: Diagnosis not present

## 2020-08-12 NOTE — Therapy (Signed)
[facility] [address] [place], [place], [address] Phone: [phone]   Fax:  [phone]  Physical Therapy Treatment  Patient Details  Name: Curtis Jimenez MRN: [id number] Date of Birth: 06-Dec-1936 Referring Provider (PT): McGowen   Encounter Date: 08/12/2020   PT End of Session - 08/12/20 1136    Visit Number 5    Number of Visits 12    Date for PT Re-Evaluation 09/09/20    Authorization Type Aetna    PT Start Time 1132    PT Stop Time 1213    PT Time Calculation (min) 41 min    Activity Tolerance Patient tolerated treatment well    Behavior During Therapy [facility] for tasks assessed/performed           Past Medical History:  Diagnosis Date  . Basal cell carcinoma    neck (skin MD 2X per year)  . Bifascicular block 2021   ectopy, asymptomatic.  Cards->observe  . Bipolar disorder ([place])   . BPH (benign prostatic hyperplasia)    with elevated PSA; followed by Dr. Rosana Hoes at [facility]  . Chronic renal insufficiency, stage III (moderate)    in pt w/hx of renal transplant.  Post-transplant sCr 1.4-1.6.  Last renal f/u 05/01/19->Cr 1.63, GFR 39 ml/min  . Coronary atherosclerosis    LM and 3 V dz noted on CT 08/2019 performed for eval of interstitial lung dz in the setting of mild DOE and mild hypoxia. Cards->no stress tesing indicated->primary prevention emphasized  . CVA (cerebral vascular accident) ([place]) 11/2016   Pontine (vertebrobasilar--imaging neg), TPA given.  Pt discharged on plavix.  Carotid dopplers ok, echocardiogram ok.  Residual deficit: vertigo but this improved greatly with therapy.  . DOE (dyspnea on exertion) 2020   DOE and ? hypoxia->CXR 08/29/19--->diffuse interstitial opacities-? acute inflammatory process->Dr. Melvyn Novas eval'd him and was underwhelmed by the CXR and exam->CT chest 09/26/19 "Very subtle areas of mild ground-glass attenuatio-nonspecific", mild air trapping. Per Dr. Melvyn Novas, no signif  ILD, improving with inc ambulation (post-inflamm pulm fibrosis?)  . Esophagitis 06/14/2020   EGD->+distal esophagitis, h pylori neg, mild distal esoph stenosis widened with forceps, benign gastric polyps.  . Gallbladder polyp 2015   Asymptomatic  . GERD (gastroesophageal reflux disease)   . Gout   . History of adenomatous polyp of colon 2018   Recall 5 yrs  . Hyperlipidemia   . Hypertension   . Hypothyroidism   . Lumbar spondylosis   . Ptosis due to aging    Right  . Rectal bleeding 09/2017   Admitted for obs due to Hb drop.  GI consulted---obs recommended.  No transfusion required.  Plavix was d/c'd, ferrous sulfate recommended.  Outpt GI f/u--plavix restarted and upper endoscopy was normal and colonoscopy showed 2 polyps and severe diverticular dz. Iron d/c'd 04/24/19.  Marland Kitchen Restless legs syndrome   . S/p cadaver renal transplant 2013   Secondary to HTN +lithium toxicity over 30 yrs caused kidney destruction [facility] transplant MDs)  . Seborrheic dermatitis    eyebrows worst: Hytone rx'd by Dr. Denna Haggard.  . 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 AV FISTULA PLACEMENT  04/08/2012   Procedure: ARTERIOVENOUS (AV) FISTULA CREATION;  Surgeon: Angelia Mould, MD;  Location: [place];  Service:  Vascular;  Laterality: Left;  Creation of left brachial cephalic arteriovenous fistula  . BIOPSY  06/14/2020   Procedure: BIOPSY;  Surgeon: Lavena Bullion, DO;  [facility];  Service: Gastroenterology;;  . CATARACT EXTRACTION, BILATERAL  05/2018  . COLONOSCOPY  2013; 11/24/17   2013; Normal except diverticulosis (recall 10 yrs).  2018 (for GI bleeding)--severe diverticular dz + 2 adenomatous polyps.  Recall 5 yrs.  . DG CHEST AP OR PA ([facility] HX)  06/03/2020    Bibasilar atelectasis. No focal consoliadation definitively identified.  . dilation for GERD    . ESOPHAGOGASTRODUODENOSCOPY  11/24/2017   gastric polyps x 2, otherwise normal.  . ESOPHAGOGASTRODUODENOSCOPY N/A 06/14/2020   +distal esophagitis, h pylori neg, mild distal esoph stenosis widened with forceps, benign gastric polypsProcedure: ESOPHAGOGASTRODUODENOSCOPY (EGD);  Surgeon: Lavena Bullion, DO;  [facility];  Service: Gastroenterology;  Laterality: N/A;  Diagnostic EGD as patietn last took Plavix 11am on 06/12/2020  . KIDNEY TRANSPLANT  11/06/12   [place] (cadaveric)  . PROSTATE BIOPSY  2011   no malignancy  . TRANSTHORACIC ECHOCARDIOGRAM  11/2016   Normal LV systolic fxn, EF 75-64%.  Grade I DD.  Mild aortic root dilatation, mild MV regurg.    There were no vitals filed for this visit.   Subjective Assessment - 08/12/20 1137    Subjective Soreness after last session in low back but it resolves in 2-3 days.    Pertinent History 2013 kidney transplant, June: espphagus hospitalization and expansion, 2017 CVA,    Currently in Pain? No/denies    Multiple Pain Sites No                             [facility] Adult PT Treatment/Exercise - 08/12/20 0001      Neuro Re-ed    Neuro Re-ed Details  Eyes closed on the black pad with light CGA: normal stance, narrow BOS, tandem stance all starting with light touch progressing to no UE.    Marching 20x on balck pad, light UE touch     Lumbar Exercises: Aerobic   Recumbent Bike L2 x 8 min, declined  heat pack.      Lumbar Exercises: Standing   Other Standing Lumbar Exercises Step ups on stair one rail 10x bil       Knee/Hip Exercises: Standing   Rebounder weigth shifting 1 min 3 ways  light UE      Knee/Hip Exercises: Seated   Long Arc Quad Strengthening;Both;1 set;10 reps;Weights    Long Arc Quad Weight 4 lbs.    Long [employer] Limitations seated on black pad in chair    Marching Strengthening;Both;1 set;10  reps;Weights    Marching Limitations seated on black pad in chair    Marching Weights 4 lbs.                    PT Short Term Goals - 08/05/20 0955      PT SHORT TERM GOAL #1   Title Pt to be independent with intial HEP    Time 2    Period Weeks    Status Achieved    Target Date 08/12/20             PT Long Term Goals - 07/29/20 1148      PT LONG TERM GOAL #1   Title Pt to be independent with final HEP    Time 6    Period Weeks  Status New    Target Date 09/09/20      PT LONG TERM GOAL #2   Title Pt to demo improved score on BERG by at least 5 points, for improved balance and fall risk.    Time 6    Period Weeks    Status New    Target Date 09/09/20      PT LONG TERM GOAL #3   Title Pt to demo improved score on DGI by at least 5 points    Time 6    Period Weeks    Status New    Target Date 09/09/20      PT LONG TERM GOAL #4   Title Pt to independenty/safely navigate at least 5 steps with 1 hand rail.    Time 6    Period Weeks    Status New    Target Date 09/09/20      PT LONG TERM GOAL #5   Title Pt to demo ability for ambulation on uneven/outdoor surface and curb negotiation, to improve safety with community activity    Time 6    Period Weeks    Status New    Target Date 09/09/20                 Plan - 08/12/20 1136    Clinical Impression Statement Pt arrives with no current pain but reports back pain typically 2-3 days post session. Pt wobbles pretty good when his eyes are closed which he reports also happens a lot when he is in the shower an wiping his face with a towel ( typically when eyes are closed ) . PTA progressed HEP to include standing balance exercises with eyes closed in various stances. PTA did not have pt do any standing hip strengthening to see if we could avoid back pain.    Personal Factors and Comorbidities Fitness;Comorbidity 1;Comorbidity 2    Comorbidities 2013 kidney transplant, June: espphagus hospitalization  and expansion, 2017 CVA,    Examination-Activity Limitations Locomotion Level;Squat;Stairs;Stand    Examination-Participation Restrictions Cleaning;Community Activity;Other    Stability/Clinical Decision Making Evolving/Moderate complexity    Rehab Potential Good    PT Frequency 2x / week    PT Duration 6 weeks    PT Treatment/Interventions ADLs/Self Care Home Management;Canalith Repostioning;Cryotherapy;DME Instruction;Ultrasound;Traction;Moist Heat;Iontophoresis 4mg /ml Dexamethasone;Gait training;Stair training;Functional mobility training;Therapeutic activities;Therapeutic exercise;Balance training;Neuromuscular re-education;Manual techniques;Patient/family education;Passive range of motion;Energy conservation;Joint Manipulations;Spinal Manipulations;Vestibular;Taping;Vasopneumatic Device    PT Next Visit Plan Review new balance exercises with eyes closed. Consider BERG next session.    PT Home Exercise Plan [id number]    Consulted and Agree with Plan of Care Patient           Patient will benefit from skilled therapeutic intervention in order to improve the following deficits and impairments:  Abnormal gait, Decreased coordination, Difficulty walking, Decreased safety awareness, Decreased endurance, Decreased activity tolerance, Pain, Impaired flexibility, Decreased balance, Decreased knowledge of use of DME, Decreased mobility, Decreased strength  Visit Diagnosis: Other abnormalities of gait and mobility  Muscle weakness (generalized)     Problem List Patient Active Problem List   Diagnosis Date Noted  . Peptic stricture of esophagus   . Emesis 06/13/2020  . Weight loss 06/13/2020  . Cough 06/13/2020  . AKI (acute kidney injury) ([place]) 06/13/2020  . Metabolic acidosis 61/60/7371  . Esophageal dysphagia   . 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 DOE (dyspnea on exertion) 08/29/2019  . Overweight (BMI 25.0-29.9) 07/27/2019  . GIB (gastrointestinal bleeding)  10/07/2017  .  IFG (impaired fasting glucose) 06/08/2017  . Stroke ([place]) 11/30/2016  . Essential hypertension 01/16/2015  . S/p cadaver renal transplant 11/06/2012  . Chronic kidney disease (CKD), stage IV (severe) ([place]) 03/18/2012  . BENIGN PROSTATIC HYPERTROPHY 04/18/2008  . EXTRINSIC ASTHMA, UNSPECIFIED 12/19/2007  . PSA, INCREASED 12/09/2007  . Hypothyroidism 08/12/2007  . Bipolar disorder ([place]) 07/27/2007  . Hyperlipidemia 07/07/2007  . Gout 07/07/2007  . HYPERTENSION 07/07/2007  . GERD 07/07/2007    Lazer Wollard, PTA 08/12/2020, 12:19 PM  [facility] [address] [place], [place], [address] Phone: [phone]   Fax:  [phone]  Name: Curtis Jimenez MRN: [id number] Date of Birth: 05-22-1936

## 2020-08-13 ENCOUNTER — Ambulatory Visit: Payer: Medicare HMO | Admitting: Dermatology

## 2020-08-13 ENCOUNTER — Other Ambulatory Visit: Payer: Medicare HMO | Admitting: Dermatology

## 2020-08-13 DIAGNOSIS — H524 Presbyopia: Secondary | ICD-10-CM | POA: Diagnosis not present

## 2020-08-13 DIAGNOSIS — H43813 Vitreous degeneration, bilateral: Secondary | ICD-10-CM | POA: Diagnosis not present

## 2020-08-13 DIAGNOSIS — H02831 Dermatochalasis of right upper eyelid: Secondary | ICD-10-CM | POA: Diagnosis not present

## 2020-08-13 DIAGNOSIS — H02834 Dermatochalasis of left upper eyelid: Secondary | ICD-10-CM | POA: Diagnosis not present

## 2020-08-14 ENCOUNTER — Encounter: Payer: Medicare HMO | Admitting: Physical Therapy

## 2020-08-19 ENCOUNTER — Other Ambulatory Visit: Payer: Self-pay

## 2020-08-19 ENCOUNTER — Ambulatory Visit: Payer: Medicare HMO | Admitting: Physical Therapy

## 2020-08-19 ENCOUNTER — Encounter: Payer: Self-pay | Admitting: Physical Therapy

## 2020-08-19 ENCOUNTER — Encounter: Payer: Medicare HMO | Admitting: Physical Therapy

## 2020-08-19 DIAGNOSIS — M6281 Muscle weakness (generalized): Secondary | ICD-10-CM | POA: Diagnosis not present

## 2020-08-19 DIAGNOSIS — R2689 Other abnormalities of gait and mobility: Secondary | ICD-10-CM | POA: Diagnosis not present

## 2020-08-19 NOTE — Therapy (Signed)
[facility] [address], [place], [place], [address] Phone: [phone]   Fax:  [phone]  Physical Therapy Treatment  Patient Details  Name: Curtis Jimenez MRN: [id number] Date of Birth: Feb 05, 1936 Referring Provider (PT): McGowen   Encounter Date: 08/19/2020   PT End of Session - 08/19/20 1144    Visit Number 6    Number of Visits 12    Date for PT Re-Evaluation 09/09/20    Authorization Type Aetna    PT Start Time 3382    PT Stop Time 1222    PT Time Calculation (min) 38 min    Activity Tolerance Patient tolerated treatment well    Behavior During Therapy [facility] for tasks assessed/performed           Past Medical History:  Diagnosis Date  . Basal cell carcinoma    neck (skin MD 2X per year)  . Bifascicular block 2021   ectopy, asymptomatic.  Cards->observe  . Bipolar disorder ([place])   . BPH (benign prostatic hyperplasia)    with elevated PSA; followed by Dr. Rosana Hoes at [facility]  . Chronic renal insufficiency, stage III (moderate)    in pt w/hx of renal transplant.  Post-transplant sCr 1.4-1.6.  Last renal f/u 05/01/19->Cr 1.63, GFR 39 ml/min  . Coronary atherosclerosis    LM and 3 V dz noted on CT 08/2019 performed for eval of interstitial lung dz in the setting of mild DOE and mild hypoxia. Cards->no stress tesing indicated->primary prevention emphasized  . CVA (cerebral vascular accident) ([place]) 11/2016   Pontine (vertebrobasilar--imaging neg), TPA given.  Pt discharged on plavix.  Carotid dopplers ok, echocardiogram ok.  Residual deficit: vertigo but this improved greatly with therapy.  . DOE (dyspnea on exertion) 2020   DOE and ? hypoxia->CXR 08/29/19--->diffuse interstitial opacities-? acute inflammatory process->Dr. Melvyn Novas eval'd him and was underwhelmed by the CXR and exam->CT chest 09/26/19 "Very subtle areas of mild ground-glass attenuatio-nonspecific", mild air trapping. Per Dr. Melvyn Novas, no signif  ILD, improving with inc ambulation (post-inflamm pulm fibrosis?)  . Esophagitis 06/14/2020   EGD->+distal esophagitis, h pylori neg, mild distal esoph stenosis widened with forceps, benign gastric polyps.  . Gallbladder polyp 2015   Asymptomatic  . GERD (gastroesophageal reflux disease)   . Gout   . History of adenomatous polyp of colon 2018   Recall 5 yrs  . Hyperlipidemia   . Hypertension   . Hypothyroidism   . Lumbar spondylosis   . Ptosis due to aging    Right  . Rectal bleeding 09/2017   Admitted for obs due to Hb drop.  GI consulted---obs recommended.  No transfusion required.  Plavix was d/c'd, ferrous sulfate recommended.  Outpt GI f/u--plavix restarted and upper endoscopy was normal and colonoscopy showed 2 polyps and severe diverticular dz. Iron d/c'd 04/24/19.  Marland Kitchen Restless legs syndrome   . S/p cadaver renal transplant 2013   Secondary to HTN +lithium toxicity over 30 yrs caused kidney destruction [facility])  . Seborrheic dermatitis    eyebrows worst: Hytone rx'd by Dr. Denna Haggard.  . 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 AV FISTULA PLACEMENT  04/08/2012   Procedure: ARTERIOVENOUS (AV) FISTULA CREATION;  Surgeon: Angelia Mould, MD;  Location: [place];  Service:  Vascular;  Laterality: Left;  Creation of left brachial cephalic arteriovenous fistula  . BIOPSY  06/14/2020   Procedure: BIOPSY;  Surgeon: Lavena Bullion, DO;  [facility];  Service: Gastroenterology;;  . CATARACT EXTRACTION, BILATERAL  05/2018  . COLONOSCOPY  2013; 11/24/17   2013; Normal except diverticulosis (recall 10 yrs).  2018 (for GI bleeding)--severe diverticular dz + 2 adenomatous polyps.  Recall 5 yrs.  . DG CHEST AP OR PA ([facility] HX)  06/03/2020    Bibasilar atelectasis. No focal consoliadation definitively identified.  . dilation for GERD    . ESOPHAGOGASTRODUODENOSCOPY  11/24/2017   gastric polyps x 2, otherwise normal.  . ESOPHAGOGASTRODUODENOSCOPY N/A 06/14/2020   +distal esophagitis, h pylori neg, mild distal esoph stenosis widened with forceps, benign gastric polypsProcedure: ESOPHAGOGASTRODUODENOSCOPY (EGD);  Surgeon: Lavena Bullion, DO;  [facility];  Service: Gastroenterology;  Laterality: N/A;  Diagnostic EGD as patietn last took Plavix 11am on 06/12/2020  . KIDNEY TRANSPLANT  11/06/12   [place] (cadaveric)  . PROSTATE BIOPSY  2011   no malignancy  . TRANSTHORACIC ECHOCARDIOGRAM  11/2016   Normal LV systolic fxn, EF 03-00%.  Grade I DD.  Mild aortic root dilatation, mild MV regurg.    There were no vitals filed for this visit.   Subjective Assessment - 08/19/20 1146    Subjective i am doing those eyes closed exercises at home. They are hard.    Pertinent History 2013 kidney transplant, June: espphagus hospitalization and expansion, 2017 CVA,    Limitations Lifting;Standing;Walking;House hold activities    Currently in Pain? No/denies    Multiple Pain Sites No                             [facility] Adult PT Treatment/Exercise - 08/19/20 0001      Neuro Re-ed    Neuro Re-ed Details  Eyes closed on the black pad with light CGA: normal stance, narrow BOS, tandem stance all starting with light touch progressing to no UE.    Marching and wt shifting 20x on balck pad, light UE touch     Lumbar Exercises: Aerobic   Recumbent Bike L2 x 8 min with discussion of status      Knee/Hip Exercises: Machines for Strengthening   Total Gym Leg Press seat 8: BilLE 90# 2x15      Knee/Hip Exercises: Standing   Stairs 4 with 1 rail reciprocally    Rebounder weigth shifting 1 min 3 ways  light UE    Other Standing Knee Exercises side stepping at counter green loop       Knee/Hip Exercises: Seated   Long Arc  Quad Strengthening;Both;1 set;10 reps;Weights    Long Arc Quad Weight 4 lbs.    Long [employer] Limitations seated on black pad in chair    Marching Strengthening;Both;1 set;15 reps;Weights    Marching Limitations seated on black pad in chair    Marching Weights 4 lbs.    Sit to Sand 1 set;10 reps;without UE support   5# kettle bell                   PT Short Term Goals - 08/05/20 0955      PT SHORT TERM GOAL #1   Title Pt to be independent with intial HEP    Time 2    Period Weeks    Status Achieved    Target Date 08/12/20  PT Long Term Goals - 08/19/20 1203      PT LONG TERM GOAL #1   Title Pt to be independent with final HEP    Baseline Met 02/26/17    Time 6    Period Weeks    Status On-going                 Plan - 08/19/20 1145    Clinical Impression Statement Pt compliant with new HEp for standing balance with eyes closed. pt reports they are very challenging. Pt required close CGA for all eyes closed ex when standing on the black pad.Pt could "feel his back" during sit to stand today.    Personal Factors and Comorbidities Fitness;Comorbidity 1;Comorbidity 2    Comorbidities 2013 kidney transplant, June: espphagus hospitalization and expansion, 2017 CVA,    Examination-Activity Limitations Locomotion Level;Squat;Stairs;Stand    Examination-Participation Restrictions Cleaning;Community Activity;Other    Stability/Clinical Decision Making Evolving/Moderate complexity    Rehab Potential Good    PT Duration 6 weeks    PT Treatment/Interventions ADLs/Self Care Home Management;Canalith Repostioning;Cryotherapy;DME Instruction;Ultrasound;Traction;Moist Heat;Iontophoresis 75m/ml Dexamethasone;Gait training;Stair training;Functional mobility training;Therapeutic activities;Therapeutic exercise;Balance training;Neuromuscular re-education;Manual techniques;Patient/family education;Passive range of motion;Energy conservation;Joint Manipulations;Spinal  Manipulations;Vestibular;Taping;Vasopneumatic Device    PT Next Visit Plan 1x more of exercises and balance activities, then retest of all balance when Pt sees PT next week.    PT Home Exercise Plan [email or web address]    Consulted and Agree with Plan of Care Patient           Patient will benefit from skilled therapeutic intervention in order to improve the following deficits and impairments:  Abnormal gait, Decreased coordination, Difficulty walking, Decreased safety awareness, Decreased endurance, Decreased activity tolerance, Pain, Impaired flexibility, Decreased balance, Decreased knowledge of use of DME, Decreased mobility, Decreased strength  Visit Diagnosis: Other abnormalities of gait and mobility  Muscle weakness (generalized)     Problem List Patient Active Problem List   Diagnosis Date Noted  . Peptic stricture of esophagus   . Emesis 06/13/2020  . Weight loss 06/13/2020  . Cough 06/13/2020  . AKI (acute kidney injury) ([place] 06/13/2020  . Metabolic acidosis 030/08/2329 . Esophageal dysphagia   . Postinflammatory pulmonary fibrosis ([place] 08/30/2019  . DOE (dyspnea on exertion) 08/29/2019  . Overweight (BMI 25.0-29.9) 07/27/2019  . GIB (gastrointestinal bleeding) 10/07/2017  . IFG (impaired fasting glucose) 06/08/2017  . Stroke ([place] 11/30/2016  . Essential hypertension 01/16/2015  . S/p cadaver renal transplant 11/06/2012  . Chronic kidney disease (CKD), stage IV (severe) ([place] 03/18/2012  . BENIGN PROSTATIC HYPERTROPHY 04/18/2008  . EXTRINSIC ASTHMA, UNSPECIFIED 12/19/2007  . PSA, INCREASED 12/09/2007  . Hypothyroidism 08/12/2007  . Bipolar disorder ([place] 07/27/2007  . Hyperlipidemia 07/07/2007  . Gout 07/07/2007  . HYPERTENSION 07/07/2007  . GERD 07/07/2007    Barry Culverhouse, PTA 08/19/2020, 12:23 PM  [facility] [address] [place] [address]Phone: [phone]  Fax:   [phone] Name: DTIRTH COTHRONMRN: [id number] of Birth: 130-Jun-1937

## 2020-08-21 ENCOUNTER — Encounter: Payer: Medicare HMO | Admitting: Physical Therapy

## 2020-08-23 ENCOUNTER — Ambulatory Visit: Payer: Medicare HMO | Admitting: Physical Therapy

## 2020-08-23 ENCOUNTER — Encounter: Payer: Self-pay | Admitting: Physical Therapy

## 2020-08-23 ENCOUNTER — Other Ambulatory Visit: Payer: Self-pay | Admitting: Family Medicine

## 2020-08-23 ENCOUNTER — Other Ambulatory Visit: Payer: Self-pay

## 2020-08-23 DIAGNOSIS — M6281 Muscle weakness (generalized): Secondary | ICD-10-CM | POA: Diagnosis not present

## 2020-08-23 DIAGNOSIS — R2689 Other abnormalities of gait and mobility: Secondary | ICD-10-CM | POA: Diagnosis not present

## 2020-08-23 NOTE — Therapy (Signed)
[facility] [address] [place], [place], [address] Phone: [phone]   Fax:  [phone]  Physical Therapy Treatment  Patient Details  Name: Curtis Jimenez MRN: [id number] Date of Birth: 02/27/1936 Referring Provider (PT): McGowen   Encounter Date: 08/23/2020   PT End of Session - 08/23/20 0806    Visit Number 7    Number of Visits 12    Date for PT Re-Evaluation 09/09/20    Authorization Type Aetna    PT Start Time 0804    PT Stop Time 0842    PT Time Calculation (min) 38 min    Activity Tolerance Patient tolerated treatment well    Behavior During Therapy [facility] for tasks assessed/performed           Past Medical History:  Diagnosis Date   Basal cell carcinoma    neck (skin MD 2X per year)   Bifascicular block 2021   ectopy, asymptomatic.  Cards->observe   Bipolar disorder ([facility])    BPH (benign prostatic hyperplasia)    with elevated PSA; followed by Dr. Rosana Hoes at [facility]   Chronic renal insufficiency, stage III (moderate)    in pt w/hx of renal transplant.  Post-transplant sCr 1.4-1.6.  Last renal f/u 05/01/19->Cr 1.63, GFR 39 ml/min   Coronary atherosclerosis    LM and 3 V dz noted on CT 08/2019 performed for eval of interstitial lung dz in the setting of mild DOE and mild hypoxia. Cards->no stress tesing indicated->primary prevention emphasized   CVA (cerebral vascular accident) ([place]) 11/2016   Pontine (vertebrobasilar--imaging neg), TPA given.  Pt discharged on plavix.  Carotid dopplers ok, echocardiogram ok.  Residual deficit: vertigo but this improved greatly with therapy.   DOE (dyspnea on exertion) 2020   DOE and ? hypoxia->CXR 08/29/19--->diffuse interstitial opacities-? acute inflammatory process->Dr. Melvyn Novas eval'd him and was underwhelmed by the CXR and exam->CT chest 09/26/19 "Very subtle areas of mild ground-glass attenuatio-nonspecific", mild air trapping. Per Dr. Melvyn Novas, no signif  ILD, improving with inc ambulation (post-inflamm pulm fibrosis?)   Esophagitis 06/14/2020   EGD->+distal esophagitis, h pylori neg, mild distal esoph stenosis widened with forceps, benign gastric polyps.   Gallbladder polyp 2015   Asymptomatic   GERD (gastroesophageal reflux disease)    Gout    History of adenomatous polyp of colon 2018   Recall 5 yrs   Hyperlipidemia    Hypertension    Hypothyroidism    Lumbar spondylosis    Ptosis due to aging    Right   Rectal bleeding 09/2017   Admitted for obs due to Hb drop.  GI consulted---obs recommended.  No transfusion required.  Plavix was d/c'd, ferrous sulfate recommended.  Outpt GI f/u--plavix restarted and upper endoscopy was normal and colonoscopy showed 2 polyps and severe diverticular dz. Iron d/c'd 04/24/19.   Restless legs syndrome    S/p cadaver renal transplant 2013   Secondary to HTN +lithium toxicity over 30 yrs caused kidney destruction ([facility] transplant MDs)   Seborrheic dermatitis    eyebrows worst: Hytone rx'd by Dr. Denna Haggard.   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  2018 (for GI bleeding)--severe diverticular dz + 2 adenomatous polyps.  Recall 5 yrs.   DG CHEST AP OR PA ([facility] HX)  06/03/2020    Bibasilar atelectasis. No focal consoliadation definitively identified.   dilation for GERD     ESOPHAGOGASTRODUODENOSCOPY  11/24/2017   gastric polyps x 2, otherwise normal.   ESOPHAGOGASTRODUODENOSCOPY N/A 06/14/2020   +distal esophagitis, h pylori neg, mild distal esoph stenosis widened with forceps, benign gastric polypsProcedure: ESOPHAGOGASTRODUODENOSCOPY (EGD);  Surgeon: Lavena Bullion, DO;  [facility];  Service: Gastroenterology;  Laterality: N/A;  Diagnostic EGD as patietn last took Plavix 11am on 06/12/2020   KIDNEY TRANSPLANT  11/06/12   [place] (cadaveric)   PROSTATE BIOPSY  2011   no malignancy   TRANSTHORACIC ECHOCARDIOGRAM  11/2016   Normal LV systolic fxn, EF 77-41%.  Grade I DD.  Mild aortic root dilatation, mild MV regurg.    There were no vitals filed for this visit.   Subjective Assessment - 08/23/20 0808    Subjective My overall balance is improving, I feel less wobbly when I walk but it is still very hard to do the eyes closed exercises.    Pertinent History 2013 kidney transplant, June: espphagus hospitalization and expansion, 2017 CVA,    Currently in Pain? No/denies    Multiple Pain Sites No                             [facility] Adult PT Treatment/Exercise - 08/23/20 0001      Neuro Re-ed    Neuro Re-ed Details  Eyes closed on the black pad with light CGA: normal stance, narrow BOS, tandem stance all starting with light touch progressing to no UE.    Marching and wt shifting 20x on balck pad, light UE touch     Lumbar Exercises: Aerobic   Recumbent Bike L2 x 10 min with discussion of status      Knee/Hip Exercises: Machines for Strengthening   Total Gym Leg Press Seat 8: BilLE 110# 3x10      Knee/Hip Exercises: Standing   Hip Abduction Stengthening;Both;2 sets;10 reps;Knee straight    Abduction Limitations 2#      Knee/Hip Exercises: Seated   Long Arc Quad Strengthening;Both;2 sets;10 reps;Weights    Long Arc Quad  Weight 4 lbs.    Long Arc Quad Limitations seated on black pad in chair    Sit to [employer] 1 set;10 reps;without UE support   6# weight, VC to push > thorugh balls of feet                   PT Short Term Goals - 08/05/20 0955      PT SHORT TERM GOAL #1   Title Pt to be independent with intial HEP    Time 2    Period Weeks    Status Achieved    Target Date 08/12/20             PT Long Term Goals - 08/19/20 1203      PT LONG TERM GOAL #1   Title Pt to be independent with final HEP  Baseline Met 02/26/17    Time 6    Period Weeks    Status On-going                 Plan - 08/23/20 0810    Clinical Impression Statement Pt reports feeling less wobbly when he walks. He still has challenges with eyes closed exercises but absolutelyt shows improvement in less wobbling.    Personal Factors and Comorbidities Fitness;Comorbidity 1;Comorbidity 2    Comorbidities 2013 kidney transplant, June: espphagus hospitalization and expansion, 2017 CVA,    Examination-Activity Limitations Locomotion Level;Squat;Stairs;Stand    Examination-Participation Restrictions Volunteer    Stability/Clinical Decision Making Evolving/Moderate complexity    Rehab Potential Good    PT Frequency 2x / week    PT Duration 6 weeks    PT Treatment/Interventions ADLs/Self Care Home Management;Canalith Repostioning;Cryotherapy;DME Instruction;Ultrasound;Traction;Moist Heat;Iontophoresis 20m/ml Dexamethasone;Gait training;Stair training;Functional mobility training;Therapeutic activities;Therapeutic exercise;Balance training;Neuromuscular re-education;Manual techniques;Patient/family education;Passive range of motion;Energy conservation;Joint Manipulations;Spinal Manipulations;Vestibular;Taping;Vasopneumatic Device    PT Next Visit Plan Re-assessment next visit. Pt would like to continue with PT as he feels this is helping alot.    PT Home Exercise Plan [id number]    Consulted and Agree with Plan of Care  Patient           Patient will benefit from skilled therapeutic intervention in order to improve the following deficits and impairments:  Abnormal gait, Decreased coordination, Difficulty walking, Decreased safety awareness, Decreased endurance, Decreased activity tolerance, Pain, Impaired flexibility, Decreased balance, Decreased knowledge of use of DME, Decreased mobility, Decreased strength  Visit Diagnosis: Other abnormalities of gait and mobility  Muscle weakness (generalized)     Problem List Patient Active Problem List   Diagnosis Date Noted   Peptic stricture of esophagus    Emesis 06/13/2020   Weight loss 06/13/2020   Cough 06/13/2020   AKI (acute kidney injury) ([place] 058/83/2549  Metabolic acidosis 082/64/1583  Esophageal dysphagia    Postinflammatory pulmonary fibrosis ([place] 08/30/2019   DOE (dyspnea on exertion) 08/29/2019   Overweight (BMI 25.0-29.9) 07/27/2019   GIB (gastrointestinal bleeding) 10/07/2017   IFG (impaired fasting glucose) 06/08/2017   Stroke ([place] 11/30/2016   Essential hypertension 01/16/2015   S/p cadaver renal transplant 11/06/2012   Chronic kidney disease (CKD), stage IV (severe) ([place] 03/18/2012   BENIGN PROSTATIC HYPERTROPHY 04/18/2008   EXTRINSIC ASTHMA, UNSPECIFIED 12/19/2007   PSA, INCREASED 12/09/2007   Hypothyroidism 08/12/2007   Bipolar disorder ([place] 07/27/2007   Hyperlipidemia 07/07/2007   Gout 07/07/2007   HYPERTENSION 07/07/2007   GERD 07/07/2007    Zainab Crumrine, PTA 08/23/2020, 8:43 AM  [facility] [address] [place] [address]Phone: [phone]  Fax:  [phone] Name: Curtis LINGARDMRN: [id number] of Birth: 1Dec 18, 1937

## 2020-08-27 ENCOUNTER — Encounter: Payer: Self-pay | Admitting: Physical Therapy

## 2020-08-27 ENCOUNTER — Ambulatory Visit: Payer: Medicare HMO | Admitting: Physical Therapy

## 2020-08-27 ENCOUNTER — Other Ambulatory Visit: Payer: Self-pay

## 2020-08-27 DIAGNOSIS — M6281 Muscle weakness (generalized): Secondary | ICD-10-CM

## 2020-08-27 DIAGNOSIS — R2689 Other abnormalities of gait and mobility: Secondary | ICD-10-CM | POA: Diagnosis not present

## 2020-08-27 DIAGNOSIS — Z940 Kidney transplant status: Secondary | ICD-10-CM | POA: Diagnosis not present

## 2020-08-27 NOTE — Therapy (Signed)
[facility] [address], [place], [place], [address] Phone: [phone]   Fax:  [phone]  Physical Therapy Treatment  Patient Details  Name: Curtis Jimenez MRN: [id number] Date of Birth: 01-17-1936 Referring Provider (PT): McGowen   Encounter Date: 08/27/2020   PT End of Session - 08/27/20 1536    Visit Number 8    Number of Visits 12    Date for PT Re-Evaluation 09/09/20    Authorization Type [employer]    PT Start Time 6226    PT Stop Time 1615    PT Time Calculation (min) 40 min    Equipment Utilized During Treatment Gait belt    Activity Tolerance Patient tolerated treatment well    Behavior During Therapy WFL for tasks assessed/performed           Past Medical History:  Diagnosis Date  . Basal cell carcinoma    neck (skin MD 2X per year)  . Bifascicular block 2021   ectopy, asymptomatic.  Cards->observe  . Bipolar disorder ([place])   . BPH (benign prostatic hyperplasia)    with elevated PSA; followed by Dr. Rosana Hoes at [facility]  . Chronic renal insufficiency, stage III (moderate)    in pt w/hx of renal transplant.  Post-transplant sCr 1.4-1.6.  Last renal f/u 05/01/19->Cr 1.63, GFR 39 ml/min  . Coronary atherosclerosis    LM and 3 V dz noted on CT 08/2019 performed for eval of interstitial lung dz in the setting of mild DOE and mild hypoxia. Cards->no stress tesing indicated->primary prevention emphasized  . CVA (cerebral vascular accident) ([place]) 11/2016   Pontine (vertebrobasilar--imaging neg), TPA given.  Pt discharged on plavix.  Carotid dopplers ok, echocardiogram ok.  Residual deficit: vertigo but this improved greatly with therapy.  . DOE (dyspnea on exertion) 2020   DOE and ? hypoxia->CXR 08/29/19--->diffuse interstitial opacities-? acute inflammatory process->Dr. Melvyn Novas eval'd him and was underwhelmed by the CXR and exam->CT chest 09/26/19 "Very subtle areas of mild ground-glass  attenuatio-nonspecific", mild air trapping. Per Dr. Melvyn Novas, no signif ILD, improving with inc ambulation (post-inflamm pulm fibrosis?)  . Esophagitis 06/14/2020   EGD->+distal esophagitis, h pylori neg, mild distal esoph stenosis widened with forceps, benign gastric polyps.  . Gallbladder polyp 2015   Asymptomatic  . GERD (gastroesophageal reflux disease)   . Gout   . History of adenomatous polyp of colon 2018   Recall 5 yrs  . Hyperlipidemia   . Hypertension   . Hypothyroidism   . Lumbar spondylosis   . Ptosis due to aging    Right  . Rectal bleeding 09/2017   Admitted for obs due to Hb drop.  GI consulted---obs recommended.  No transfusion required.  Plavix was d/c'd, ferrous sulfate recommended.  Outpt GI f/u--plavix restarted and upper endoscopy was normal and colonoscopy showed 2 polyps and severe diverticular dz. Iron d/c'd 04/24/19.  Marland Kitchen Restless legs syndrome   . S/p cadaver renal transplant 2013   Secondary to HTN +lithium toxicity over 30 yrs caused kidney destruction [facility] transplant MDs)  . Seborrheic dermatitis    eyebrows worst: Hytone rx'd by Dr. Denna Haggard.  . 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 AV FISTULA PLACEMENT  04/08/2012   Procedure: ARTERIOVENOUS (AV) FISTULA CREATION;  Surgeon: Harrell Gave  Nicole Cella, MD;  Location: [place] [facility];  Service: Vascular;  Laterality: Left;  Creation of left brachial cephalic arteriovenous fistula  . BIOPSY  06/14/2020   Procedure: BIOPSY;  Surgeon: Lavena Bullion, DO;  Location: [facility];  Service: Gastroenterology;;  . CATARACT EXTRACTION, BILATERAL  05/2018  . COLONOSCOPY  2013; 11/24/17   2013; Normal except diverticulosis (recall 10 yrs).  2018 (for GI bleeding)--severe diverticular dz + 2 adenomatous  polyps.  Recall 5 yrs.  . DG CHEST AP OR PA ([facility] HX)  06/03/2020   Bibasilar atelectasis. No focal consoliadation definitively identified.  . dilation for GERD    . ESOPHAGOGASTRODUODENOSCOPY  11/24/2017   gastric polyps x 2, otherwise normal.  . ESOPHAGOGASTRODUODENOSCOPY N/A 06/14/2020   +distal esophagitis, h pylori neg, mild distal esoph stenosis widened with forceps, benign gastric polypsProcedure: ESOPHAGOGASTRODUODENOSCOPY (EGD);  Surgeon: Lavena Bullion, DO;  Location: [facility];  Service: Gastroenterology;  Laterality: N/A;  Diagnostic EGD as patietn last took Plavix 11am on 06/12/2020  . KIDNEY TRANSPLANT  11/06/12   [place] (cadaveric)  . PROSTATE BIOPSY  2011   no malignancy  . TRANSTHORACIC ECHOCARDIOGRAM  11/2016   Normal LV systolic fxn, EF 49-17%.  Grade I DD.  Mild aortic root dilatation, mild MV regurg.    There were no vitals filed for this visit.   Subjective Assessment - 08/27/20 1535    Subjective I am less wobbly when I walk.  I do still lose my balance when I turn around when working in kitchen or when I close my eyes while showering - I have to hold the walls.  Overall I feel like I'm improving but have a ways to go.    Pertinent History 2013 kidney transplant, June: espphagus hospitalization and expansion, 2017 CVA,    Limitations Lifting;Standing;Walking;House hold activities    Patient Stated Goals Improved balance, less stumbling / close falls    Currently in Pain? No/denies              [facility] PT Assessment - 08/27/20 0001      Assessment   Medical Diagnosis Balance/ Falls    Referring Provider (PT) McGowen    Prior Therapy after CVA 2017      Strength   Overall Strength Comments Hips 4/5 bil, knees 4+/5 bil      Ambulation/Gait   Gait Pattern Decreased arm swing - right;Decreased arm swing - left;Decreased step length - left;Decreased step length - right;Right foot flat;Narrow base of support    Gait Comments narrow gait pattern with  occassional tandem foot placement with gait with head movements      Balance   Balance Assessed Yes      Standardized Balance Assessment   Standardized Balance Assessment Berg Balance Test;Dynamic Gait Index      Berg Balance Test   Sit to Stand Able to stand without using hands and stabilize independently    Standing Unsupported Able to stand safely 2 minutes    Sitting with Back Unsupported but Feet Supported on Floor or Stool Able to sit safely and securely 2 minutes    Stand to Sit Sits safely with minimal use of hands    Transfers Able to transfer safely, minor use of hands    Standing Unsupported with Eyes Closed Able to stand 10 seconds with supervision    Standing Unsupported with Feet Together Able to place feet together independently and stand 1 minute safely    From Standing, Reach Forward with Outstretched Arm Can  reach forward >12 cm safely (5")    From Standing Position, Pick up Object from [place] to pick up shoe safely and easily    From Standing Position, Turn to Look Behind Over each Shoulder Looks behind from both sides and weight shifts well    Turn 360 Degrees Able to turn 360 degrees safely but slowly    Standing Unsupported, Alternately Place Feet on Step/Stool Able to complete 4 steps without aid or supervision    Standing Unsupported, One Foot in Front Able to take small step independently and hold 30 seconds    Standing on One Leg Able to lift leg independently and hold equal to or more than 3 seconds    Total Score 46    Berg comment: fall risk, improving      Dynamic Gait Index   Level Surface Normal    Change in Gait Speed Normal    Gait with Horizontal Head Turns Moderate Impairment    Gait with Vertical Head Turns Moderate Impairment    Gait and Pivot Turn Mild Impairment    Step Over Obstacle Mild Impairment    Step Around Obstacles Mild Impairment    Steps Mild Impairment    Total Score 16    DGI comment: fall risk, improving                          [facility] Adult PT Treatment/Exercise - 08/27/20 0001      Lumbar Exercises: Aerobic   Recumbent Bike L2 x 6' PT present to discuss plan for today and progress with PT               Balance Exercises - 08/27/20 0001      Balance Exercises: Standing   Tandem Stance Eyes closed;10 secs   head turns and nods x 5 each after 10 sec hold, 3 cycles   Stepping Strategy Lateral   quarter turns, 2 finger support, VCs for 90 deg foot turns              PT Short Term Goals - 08/27/20 1536      PT SHORT TERM GOAL #1   Title Pt to be independent with intial HEP    Status Achieved      PT SHORT TERM GOAL #2   Title Pt to demo ability for stand/wait after sit to stand, to unsure no dizziness    Baseline every now and then light headed due to low BP    Status Achieved      PT SHORT TERM GOAL #3   Title -      PT SHORT TERM GOAL #4   Title -      PT SHORT TERM GOAL #5   Title -             PT Long Term Goals - 08/27/20 1538      PT LONG TERM GOAL #1   Title Pt to be independent with final HEP    Baseline progressing balance challenges    Status On-going      PT LONG TERM GOAL #2   Title Pt to demo improved score on BERG by at least 5 points, for improved balance and fall risk.    Baseline 46/56 08/27/20, previously 42/56    Status On-going      PT LONG TERM GOAL #3   Title Pt to demo improved score on DGI by at least 5 points  Baseline improved from 15 to 16 points    Status On-going      PT LONG TERM GOAL #4   Title Pt to independenty/safely navigate at least 5 steps with 1 hand rail.    Status Achieved      PT LONG TERM GOAL #5   Title Pt to demo ability for ambulation on uneven/outdoor surface and curb negotiation, to improve safety with community activity    Baseline this was a goal from last episode of care    Status Deferred                 Plan - 08/27/20 1628    Clinical Impression Statement Pt is making ongoing  progress toward all goals.  He met his stairs with single rail LTG today.  His LE strength has improved to 4/5 bil hips and 4+/5 bil knees.  He continues to demo fall risk with BERG score improving by 4 points to 46/56 from 42/56 and DGI improving by 1 point from 15/24 to 16/24.  Most challenges are in SLS and narrow BOS, eyes closed and head turns.  He feels he is less wobbly with general gait but PT noted when gait presents with other combined challenges his gait pattern narrows and he tends to trip over his own feet which drift toward tandem foot placement.  Pt reports he feels unsteady in his shower with eyes closed and with turning while performing kitchen tasks.  PT updated countertop balance exercises today including quarter turn practice with demo and VCs for 90 deg foot turn placement vs many shuffle steps.  Pt will continue to benefit from skilled PT with focus on dynamic and static balance with layered challenges, progression of trunk and hip strength, and gait training.    Comorbidities 2013 kidney transplant, June: espphagus hospitalization and expansion, 2017 CVA,    Examination-Activity Limitations Locomotion Level;Squat;Stairs;Stand    Examination-Participation Restrictions Volunteer    Stability/Clinical Decision Making Evolving/Moderate complexity    Rehab Potential Good    PT Frequency 2x / week    PT Duration 8 weeks   some weeks will be 0-2 appts given clinic availability   PT Treatment/Interventions ADLs/Self Care Home Management;Canalith Repostioning;Cryotherapy;DME Instruction;Ultrasound;Traction;Moist Heat;Iontophoresis 50m/ml Dexamethasone;Gait training;Stair training;Functional mobility training;Therapeutic activities;Therapeutic exercise;Balance training;Neuromuscular re-education;Manual techniques;Patient/family education;Passive range of motion;Energy conservation;Joint Manipulations;Spinal Manipulations;Vestibular;Taping;Vasopneumatic Device    PT Next Visit Plan revisit last  2 [employer] balance tasks ([place], practice cone weaving, gait with head turns and nods, progress counter turns from quarter to half turns, march toe taps on step, SLS    PT Home Exercise Plan [id number]    Consulted and Agree with Plan of Care Patient           Patient will benefit from skilled therapeutic intervention in order to improve the following deficits and impairments:  Abnormal gait, Decreased coordination, Difficulty walking, Decreased safety awareness, Decreased endurance, Decreased activity tolerance, Pain, Impaired flexibility, Decreased balance, Decreased knowledge of use of DME, Decreased mobility, Decreased strength  Visit Diagnosis: Other abnormalities of gait and mobility - Plan: PT plan of care cert/re-cert  Muscle weakness (generalized) - Plan: PT plan of care cert/re-cert     Problem List Patient Active Problem List   Diagnosis Date Noted  . Peptic stricture of esophagus   . Emesis 06/13/2020  . Weight loss 06/13/2020  . Cough 06/13/2020  . [place]) [place] 06/13[place]  . Metabolic acidosis 027/78/2423 . Esophageal dysphagia   . 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 DOE (dyspnea on exertion) 08/29/2019  . Overweight (BMI 25.0-29.9) 07/27/2019  . GIB (gastrointestinal bleeding) 10/07/2017  . IFG (impaired fasting glucose) 06/08/2017  . Stroke ([place]) 11/30/2016  . Essential hypertension 01/16/2015  . S/p cadaver renal transplant 11/06/2012  . Chronic kidney disease (CKD), stage IV (severe) ([place]) 03/18/2012  . BENIGN PROSTATIC HYPERTROPHY 04/18/2008  . EXTRINSIC ASTHMA, UNSPECIFIED 12/19/2007  . PSA, INCREASED 12/09/2007  . Hypothyroidism 08/12/2007  . Bipolar disorder ([place]) 07/27/2007  . Hyperlipidemia 07/07/2007  . Gout 07/07/2007  . HYPERTENSION 07/07/2007  . GERD 07/07/2007    Baruch Merl, PT 08/27/20 4:43 PM   [facility] [address] [place], [place], [address] Phone: [phone]   Fax:  [phone]  Name: Curtis Jimenez MRN: [id number] Date of Birth: 01-Jul-1936

## 2020-08-28 ENCOUNTER — Other Ambulatory Visit: Payer: Self-pay | Admitting: Dermatology

## 2020-08-28 ENCOUNTER — Encounter: Payer: Self-pay | Admitting: Dermatology

## 2020-08-28 ENCOUNTER — Ambulatory Visit: Payer: Medicare HMO | Admitting: Dermatology

## 2020-08-28 DIAGNOSIS — Z1283 Encounter for screening for malignant neoplasm of skin: Secondary | ICD-10-CM

## 2020-08-28 DIAGNOSIS — D044 Carcinoma in situ of skin of scalp and neck: Secondary | ICD-10-CM | POA: Diagnosis not present

## 2020-08-28 DIAGNOSIS — D0439 Carcinoma in situ of skin of other parts of face: Secondary | ICD-10-CM | POA: Diagnosis not present

## 2020-08-28 DIAGNOSIS — L820 Inflamed seborrheic keratosis: Secondary | ICD-10-CM | POA: Diagnosis not present

## 2020-08-28 DIAGNOSIS — D485 Neoplasm of uncertain behavior of skin: Secondary | ICD-10-CM

## 2020-08-28 DIAGNOSIS — L570 Actinic keratosis: Secondary | ICD-10-CM | POA: Diagnosis not present

## 2020-08-28 NOTE — Patient Instructions (Signed)

## 2020-08-30 DIAGNOSIS — K922 Gastrointestinal hemorrhage, unspecified: Secondary | ICD-10-CM | POA: Diagnosis not present

## 2020-08-30 DIAGNOSIS — M7989 Other specified soft tissue disorders: Secondary | ICD-10-CM | POA: Diagnosis not present

## 2020-08-30 DIAGNOSIS — M109 Gout, unspecified: Secondary | ICD-10-CM | POA: Diagnosis not present

## 2020-08-30 DIAGNOSIS — E669 Obesity, unspecified: Secondary | ICD-10-CM | POA: Diagnosis not present

## 2020-08-30 DIAGNOSIS — B9789 Other viral agents as the cause of diseases classified elsewhere: Secondary | ICD-10-CM | POA: Diagnosis not present

## 2020-08-30 DIAGNOSIS — I129 Hypertensive chronic kidney disease with stage 1 through stage 4 chronic kidney disease, or unspecified chronic kidney disease: Secondary | ICD-10-CM | POA: Diagnosis not present

## 2020-08-30 DIAGNOSIS — R739 Hyperglycemia, unspecified: Secondary | ICD-10-CM | POA: Diagnosis not present

## 2020-08-30 DIAGNOSIS — Z940 Kidney transplant status: Secondary | ICD-10-CM | POA: Diagnosis not present

## 2020-08-30 DIAGNOSIS — R0902 Hypoxemia: Secondary | ICD-10-CM | POA: Diagnosis not present

## 2020-08-30 DIAGNOSIS — J028 Acute pharyngitis due to other specified organisms: Secondary | ICD-10-CM | POA: Diagnosis not present

## 2020-09-03 ENCOUNTER — Other Ambulatory Visit: Payer: Self-pay

## 2020-09-03 ENCOUNTER — Ambulatory Visit: Payer: Medicare HMO | Attending: Family Medicine | Admitting: Physical Therapy

## 2020-09-03 ENCOUNTER — Telehealth: Payer: Self-pay

## 2020-09-03 DIAGNOSIS — R2689 Other abnormalities of gait and mobility: Secondary | ICD-10-CM | POA: Diagnosis not present

## 2020-09-03 DIAGNOSIS — M6281 Muscle weakness (generalized): Secondary | ICD-10-CM | POA: Insufficient documentation

## 2020-09-03 NOTE — Telephone Encounter (Signed)
-----   Message from Lavonna Monarch, MD sent at 08/30/2020  9:59 PM EDT ----- 30 with DrT #3 biopsy

## 2020-09-03 NOTE — Telephone Encounter (Signed)
Pathology given to patien

## 2020-09-03 NOTE — Therapy (Signed)
[facility] [address], [place], [place], [address] Phone: [phone]   Fax:  [phone]  Physical Therapy Treatment  Patient Details  Name: Curtis Jimenez MRN: [id number] Date of Birth: August 13, 1936 Referring Provider (PT): McGowen   Encounter Date: 09/03/2020   PT End of Session - 09/03/20 1640    Visit Number 9    Number of Visits 12    Date for PT Re-Evaluation 09/09/20    Authorization Type [employer]    PT Start Time 1445    PT Stop Time 1528    PT Time Calculation (min) 43 min    Activity Tolerance Patient tolerated treatment well           Past Medical History:  Diagnosis Date  . Basal cell carcinoma    neck (skin MD 2X per year)  . Bifascicular block 2021   ectopy, asymptomatic.  Cards->observe  . Bipolar disorder ([place])   . BPH (benign prostatic hyperplasia)    with elevated PSA; followed by Dr. Rosana Hoes at [facility]  . Chronic renal insufficiency, stage III (moderate)    in pt w/hx of renal transplant.  Post-transplant sCr 1.4-1.6.  Last renal f/u 05/01/19->Cr 1.63, GFR 39 ml/min  . Coronary atherosclerosis    LM and 3 V dz noted on CT 08/2019 performed for eval of interstitial lung dz in the setting of mild DOE and mild hypoxia. Cards->no stress tesing indicated->primary prevention emphasized  . CVA (cerebral vascular accident) ([place]) 11/2016   Pontine (vertebrobasilar--imaging neg), TPA given.  Pt discharged on plavix.  Carotid dopplers ok, echocardiogram ok.  Residual deficit: vertigo but this improved greatly with therapy.  . DOE (dyspnea on exertion) 2020   DOE and ? hypoxia->CXR 08/29/19--->diffuse interstitial opacities-? acute inflammatory process->Dr. Melvyn Novas eval'd him and was underwhelmed by the CXR and exam->CT chest 09/26/19 "Very subtle areas of mild ground-glass attenuatio-nonspecific", mild air trapping. Per Dr. Melvyn Novas, no signif ILD, improving with inc ambulation (post-inflamm pulm fibrosis?)   . Esophagitis 06/14/2020   EGD->+distal esophagitis, h pylori neg, mild distal esoph stenosis widened with forceps, benign gastric polyps.  . Gallbladder polyp 2015   Asymptomatic  . GERD (gastroesophageal reflux disease)   . Gout   . History of adenomatous polyp of colon 2018   Recall 5 yrs  . Hyperlipidemia   . Hypertension   . Hypothyroidism   . Lumbar spondylosis   . Ptosis due to aging    Right  . Rectal bleeding 09/2017   Admitted for obs due to Hb drop.  GI consulted---obs recommended.  No transfusion required.  Plavix was d/c'd, ferrous sulfate recommended.  Outpt GI f/u--plavix restarted and upper endoscopy was normal and colonoscopy showed 2 polyps and severe diverticular dz. Iron d/c'd 04/24/19.  Marland Kitchen Restless legs syndrome   . S/p cadaver renal transplant 2013   Secondary to HTN +lithium toxicity over 30 yrs caused kidney destruction [facility] transplant MDs)  . Seborrheic dermatitis    eyebrows worst: Hytone rx'd by Dr. Denna Haggard.  . 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 AV FISTULA PLACEMENT  04/08/2012   Procedure: ARTERIOVENOUS (AV) FISTULA CREATION;  Surgeon: Angelia Mould, MD;  Location: [place];  Service: Vascular;  Laterality: Left;  Creation of left brachial cephalic  arteriovenous fistula  . BIOPSY  06/14/2020   Procedure: BIOPSY;  Surgeon: Lavena Bullion, DO;  Location: [facility];  Service: Gastroenterology;;  . CATARACT EXTRACTION, BILATERAL  05/2018  . COLONOSCOPY  2013; 11/24/17   2013; Normal except diverticulosis (recall 10 yrs).  2018 (for GI bleeding)--severe diverticular dz + 2 adenomatous polyps.  Recall 5 yrs.  . DG CHEST AP OR PA ([facility] HX)  06/03/2020   Bibasilar atelectasis. No focal consoliadation definitively  identified.  . dilation for GERD    . ESOPHAGOGASTRODUODENOSCOPY  11/24/2017   gastric polyps x 2, otherwise normal.  . ESOPHAGOGASTRODUODENOSCOPY N/A 06/14/2020   +distal esophagitis, h pylori neg, mild distal esoph stenosis widened with forceps, benign gastric polypsProcedure: ESOPHAGOGASTRODUODENOSCOPY (EGD);  Surgeon: Lavena Bullion, DO;  [facility];  Service: Gastroenterology;  Laterality: N/A;  Diagnostic EGD as patietn last took Plavix 11am on 06/12/2020  . KIDNEY TRANSPLANT  11/06/12   [place] (cadaveric)  . PROSTATE BIOPSY  2011   no malignancy  . TRANSTHORACIC ECHOCARDIOGRAM  11/2016   Normal LV systolic fxn, EF 72-62%.  Grade I DD.  Mild aortic root dilatation, mild MV regurg.    There were no vitals filed for this visit.   Subjective Assessment - 09/03/20 1449    Subjective I'm about the same.  No problems after last session.  If I go slow it doesn't hurt my back.  History of chronic LBP.  No cane today.    Pertinent History 2013 kidney transplant, June: espphagus hospitalization and expansion, 2017 CVA,    Currently in Pain? No/denies    Multiple Pain Sites No                             [facility] Adult PT Treatment/Exercise - 09/03/20 0001      Therapeutic Activites    Therapeutic Activities ADL's;Other Therapeutic Activities    ADL's sit to stand, climbing steps.curbs     Other Therapeutic Activities confident walk in hallway at "moderately difficult" pace 5x       Neuro Re-ed    Neuro Re-ed Details  standing on foam  with head movements, UE reaching, combo head and UE, eyes closed in narrow base of suppor and staggered standing; standing on black foam 4 way weight shift      Lumbar Exercises: Stretches   Other Lumbar Stretch Exercise 2nd step hip flexor stretch with UE raise 10x right/left       Lumbar Exercises: Aerobic   Recumbent Bike L2 x 4 min ' PT present to discuss plan for today and progress with PT      Knee/Hip Exercises:  Machines for Strengthening   Total Gym Leg Press Seat 8: BilLE 110# 3x10      Knee/Hip Exercises: Standing   Forward Step Up Right;Left;10 reps;Hand Hold: 1;Step Height: 6"    Other Standing Knee Exercises static side step red loop 10x       Knee/Hip Exercises: Seated   Sit to Sand 2 sets;5 reps;without UE support   7# weight, VC to push > thorugh balls of feet                   PT Short Term Goals - 08/27/20 1536      PT SHORT TERM GOAL #1   Title Pt to be independent with intial HEP    Status Achieved      PT SHORT TERM GOAL #2  Title Pt to demo ability for stand/wait after sit to stand, to unsure no dizziness    Baseline every now and then light headed due to low BP    Status Achieved      PT SHORT TERM GOAL #3   Title -      PT SHORT TERM GOAL #4   Title -      PT SHORT TERM GOAL #5   Title -             PT Long Term Goals - 08/27/20 1538      PT LONG TERM GOAL #1   Title Pt to be independent with final HEP    Baseline progressing balance challenges    Status On-going      PT LONG TERM GOAL #2   Title Pt to demo improved score on BERG by at least 5 points, for improved balance and fall risk.    Baseline 46/56 08/27/20, previously 42/56    Status On-going      PT LONG TERM GOAL #3   Title Pt to demo improved score on DGI by at least 5 points    Baseline improved from 15 to 16 points    Status On-going      PT LONG TERM GOAL #4   Title Pt to independenty/safely navigate at least 5 steps with 1 hand rail.    Status Achieved      PT LONG TERM GOAL #5   Title Pt to demo ability for ambulation on uneven/outdoor surface and curb negotiation, to improve safety with community activity    Baseline this was a goal from last episode of care    Status Deferred                 Plan - 09/03/20 1640    Clinical Impression Statement The patient is able to continue with moderate dynamic balance challenges with mostly CGA.  He needs min assist to  recover balance with turning his head/shoulders while in staggered standing and with eyes closed standing on soft surface in staggered stance.  He reports his chronic LBP while flare up with too many repetitions of exercise but states he usually does fine with 10 reps or so.  He rates his self perceived exertion level at an 8/20 during and post treatment session.  Therapist monitoring response throughout treatment session.    Comorbidities 2013 kidney transplant, June: espphagus hospitalization and expansion, 2017 CVA,    Rehab Potential Good    PT Frequency 2x / week    PT Duration 8 weeks    PT Treatment/Interventions ADLs/Self Care Home Management;Canalith Repostioning;Cryotherapy;DME Instruction;Ultrasound;Traction;Moist Heat;Iontophoresis 4mg /ml Dexamethasone;Gait training;Stair training;Functional mobility training;Therapeutic activities;Therapeutic exercise;Balance training;Neuromuscular re-education;Manual techniques;Patient/family education;Passive range of motion;Energy conservation;Joint Manipulations;Spinal Manipulations;Vestibular;Taping;Vasopneumatic Device    PT Next Visit Plan 10th visit progress note next visit;  revisit last 2 Medbridge balance tasks (see notes on [place]), practice cone weaving, gait with head turns and nods, progress counter turns from quarter to half turns, march toe taps on step, SLS    PT Home Exercise Plan [id number]           Patient will benefit from skilled therapeutic intervention in order to improve the following deficits and impairments:  Abnormal gait, Decreased coordination, Difficulty walking, Decreased safety awareness, Decreased endurance, Decreased activity tolerance, Pain, Impaired flexibility, Decreased balance, Decreased knowledge of use of DME, Decreased mobility, Decreased strength  Visit Diagnosis: Other abnormalities of gait and mobility  Muscle weakness (generalized)  Problem List Patient Active Problem List   Diagnosis Date  Noted  . Peptic stricture of esophagus   . Emesis 06/13/2020  . Weight loss 06/13/2020  . Cough 06/13/2020  . AKI (acute kidney injury) ([place]) 06/13/2020  . Metabolic acidosis 09/62/8366  . Esophageal dysphagia   . Postinflammatory pulmonary fibrosis ([place]) 08/30/2019  . DOE (dyspnea on exertion) 08/29/2019  . Overweight (BMI 25.0-29.9) 07/27/2019  . GIB (gastrointestinal bleeding) 10/07/2017  . IFG (impaired fasting glucose) 06/08/2017  . Stroke ([place]) 11/30/2016  . Essential hypertension 01/16/2015  . S/p cadaver renal transplant 11/06/2012  . Chronic kidney disease (CKD), stage IV (severe) ([place]) 03/18/2012  . BENIGN PROSTATIC HYPERTROPHY 04/18/2008  . EXTRINSIC ASTHMA, UNSPECIFIED 12/19/2007  . PSA, INCREASED 12/09/2007  . Hypothyroidism 08/12/2007  . Bipolar disorder ([place]) 07/27/2007  . Hyperlipidemia 07/07/2007  . Gout 07/07/2007  . HYPERTENSION 07/07/2007  . GERD 07/07/2007   Ruben Im, PT 09/03/20 4:52 PM Phone: [phone] Fax: [phone] Alvera Singh 09/03/2020, 4:51 PM  [facility] [address] [place], [place], [address] Phone: [phone]   Fax:  [phone]  Name: Curtis Jimenez MRN: [id number] Date of Birth: 1936/11/04

## 2020-09-09 ENCOUNTER — Ambulatory Visit: Payer: Medicare HMO | Admitting: Physical Therapy

## 2020-09-09 ENCOUNTER — Other Ambulatory Visit: Payer: Self-pay

## 2020-09-09 ENCOUNTER — Encounter: Payer: Self-pay | Admitting: Physical Therapy

## 2020-09-09 DIAGNOSIS — R2689 Other abnormalities of gait and mobility: Secondary | ICD-10-CM | POA: Diagnosis not present

## 2020-09-09 DIAGNOSIS — M6281 Muscle weakness (generalized): Secondary | ICD-10-CM | POA: Diagnosis not present

## 2020-09-09 NOTE — Therapy (Addendum)
[facility] [address] [place], [place], [address] Phone: [phone]   Fax:  [phone]  Physical Therapy Treatment  Patient Details  Name: Curtis Jimenez MRN: [id number] Date of Birth: 1936-09-25 Referring Provider (PT): McGowen  Progress Note Reporting Period 07/29/20 to 09/09/20  See note below for Objective Data and Assessment of Progress/Goals.      Encounter Date: 09/09/2020   PT End of Session - 09/09/20 1059    Visit Number 10    Date for PT Re-Evaluation 10/30/20    PT Start Time 1059   Pt requested to end session d/t fatigue   PT Stop Time 1129    PT Time Calculation (min) 30 min    Activity Tolerance Patient tolerated treatment well    Behavior During Therapy [facility] for tasks assessed/performed           Past Medical History:  Diagnosis Date  . Basal cell carcinoma    neck (skin MD 2X per year)  . Bifascicular block 2021   ectopy, asymptomatic.  Cards->observe  . Bipolar disorder ([place])   . BPH (benign prostatic hyperplasia)    with elevated PSA; followed by Dr. Rosana Hoes at [facility]  . Chronic renal insufficiency, stage III (moderate)    in pt w/hx of renal transplant.  Post-transplant sCr 1.4-1.6.  Last renal f/u 05/01/19->Cr 1.63, GFR 39 ml/min  . Coronary atherosclerosis    LM and 3 V dz noted on CT 08/2019 performed for eval of interstitial lung dz in the setting of mild DOE and mild hypoxia. Cards->no stress tesing indicated->primary prevention emphasized  . CVA (cerebral vascular accident) ([place]) 11/2016   Pontine (vertebrobasilar--imaging neg), TPA given.  Pt discharged on plavix.  Carotid dopplers ok, echocardiogram ok.  Residual deficit: vertigo but this improved greatly with therapy.  . DOE (dyspnea on exertion) 2020   DOE and ? hypoxia->CXR 08/29/19--->diffuse interstitial opacities-? acute inflammatory process->Dr. Melvyn Novas eval'd him and was underwhelmed by the CXR and exam->CT chest  09/26/19 "Very subtle areas of mild ground-glass attenuatio-nonspecific", mild air trapping. Per Dr. Melvyn Novas, no signif ILD, improving with inc ambulation (post-inflamm pulm fibrosis?)  . Esophagitis 06/14/2020   EGD->+distal esophagitis, h pylori neg, mild distal esoph stenosis widened with forceps, benign gastric polyps.  . Gallbladder polyp 2015   Asymptomatic  . GERD (gastroesophageal reflux disease)   . Gout   . History of adenomatous polyp of colon 2018   Recall 5 yrs  . Hyperlipidemia   . Hypertension   . Hypothyroidism   . Lumbar spondylosis   . Ptosis due to aging    Right  . Rectal bleeding 09/2017   Admitted for obs due to Hb drop.  GI consulted---obs recommended.  No transfusion required.  Plavix was d/c'd, ferrous sulfate recommended.  Outpt GI f/u--plavix restarted and upper endoscopy was normal and colonoscopy showed 2 polyps and severe diverticular dz. Iron d/c'd 04/24/19.  Marland Kitchen Restless legs syndrome   . S/p cadaver renal transplant 2013   Secondary to HTN +lithium toxicity over 30 yrs caused kidney destruction [facility] transplant MDs)  . Seborrheic dermatitis    eyebrows worst: Hytone rx'd by Dr. Denna Haggard.  . 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 AV FISTULA PLACEMENT  04/08/2012  Procedure: ARTERIOVENOUS (AV) FISTULA CREATION;  Surgeon: Angelia Mould, MD;  Location: [facility];  Service: Vascular;  Laterality: Left;  Creation of left brachial cephalic arteriovenous fistula  . BIOPSY  06/14/2020   Procedure: BIOPSY;  Surgeon: Lavena Bullion, DO;  [facility];  Service: Gastroenterology;;  . CATARACT EXTRACTION, BILATERAL  05/2018  . COLONOSCOPY  2013; 11/24/17   2013; Normal except diverticulosis (recall 10 yrs).  2018 (for GI  bleeding)--severe diverticular dz + 2 adenomatous polyps.  Recall 5 yrs.  . DG CHEST AP OR PA ([facility] HX)  06/03/2020   Bibasilar atelectasis. No focal consoliadation definitively identified.  . dilation for GERD    . ESOPHAGOGASTRODUODENOSCOPY  11/24/2017   gastric polyps x 2, otherwise normal.  . ESOPHAGOGASTRODUODENOSCOPY N/A 06/14/2020   +distal esophagitis, h pylori neg, mild distal esoph stenosis widened with forceps, benign gastric polypsProcedure: ESOPHAGOGASTRODUODENOSCOPY (EGD);  Surgeon: Lavena Bullion, DO;  [facility];  Service: Gastroenterology;  Laterality: N/A;  Diagnostic EGD as patietn last took Plavix 11am on 06/12/2020  . KIDNEY TRANSPLANT  11/06/12   [place] (cadaveric)  . PROSTATE BIOPSY  2011   no malignancy  . TRANSTHORACIC ECHOCARDIOGRAM  11/2016   Normal LV systolic fxn, EF 15-40%.  Grade I DD.  Mild aortic root dilatation, mild MV regurg.    There were no vitals filed for this visit.   Subjective Assessment - 09/09/20 1101    Subjective I fell the other day, my foot got caught up on my hassock as I walked by it and I fell. I am ok.    Pertinent History 2013 kidney transplant, June: espphagus hospitalization and expansion, 2017 CVA,    Currently in Pain? No/denies    Multiple Pain Sites No              [facility] PT Assessment - 09/09/20 0001      Assessment   Medical Diagnosis Balance/ Falls    Referring Provider (PT) McGowen    Prior Therapy after CVA 2017      Strength   Overall Strength Comments Hips 4/5 bil, knees 4+/5 bil      Ambulation/Gait   Gait Pattern Decreased arm swing - right;Decreased arm swing - left;Decreased step length - left;Decreased step length - right;Right foot flat;Narrow base of support    Gait Comments narrow gait pattern with occassional tandem foot placement with gait with head movements      Standardized Balance Assessment   Standardized Balance Assessment --                         [facility] Adult  PT Treatment/Exercise - 09/09/20 0001      Neuro Re-ed    Neuro Re-ed Details  Standing on foam roll; static stance, head nods and turns slow to inhibit vertigo symptoms, all no UE: Marching with light UE 1 min 2x, Tandem stance Bil 20 sec no UE   Walking with change of speeds, walking with high knee march     Lumbar Exercises: Stretches   Other Lumbar Stretch Exercise 2nd step hip flexor stretch with UE raise 10x right/left       Lumbar Exercises: Aerobic   Recumbent Bike L3 x 6 min with PTA present      Knee/Hip Exercises: Machines for Strengthening   Total Gym Leg Press Seat 8: BilLE 110# 3x10      Knee/Hip Exercises: Seated   Sit to Sand 2 sets;5 reps;without UE support  7# weight, VC to push > thorugh balls of feet                   PT Short Term Goals - 08/27/20 1536      PT SHORT TERM GOAL #1   Title Pt to be independent with intial HEP    Status Achieved      PT SHORT TERM GOAL #2   Title Pt to demo ability for stand/wait after sit to stand, to unsure no dizziness    Baseline every now and then light headed due to low BP    Status Achieved      PT SHORT TERM GOAL #3   Title -      PT SHORT TERM GOAL #4   Title -      PT SHORT TERM GOAL #5   Title -             PT Long Term Goals - 09/09/20 1133      PT LONG TERM GOAL #1   Title Pt to be independent with final HEP    Baseline progressing balance challenges    Period Weeks    Status On-going      PT LONG TERM GOAL #2   Baseline 46/56 08/27/20, previously 42/56    Time 6    Period Weeks    Status On-going      PT LONG TERM GOAL #3   Title Pt to demo improved score on DGI by at least 5 points    Baseline improved from 15 to 16 points    Time 6    Period Weeks    Status On-going      PT LONG TERM GOAL #4   Title Pt to independenty/safely navigate at least 5 steps with 1 hand rail.    Time 6    Period Weeks    Status Achieved                 Plan - 09/09/20 1100    Clinical  Impression Statement Pt experienced significsnt fatigue after standing balance activities on the black pad that he requested to sit for 1-2 min. Pt reports having a fall the other day when he caught his foot on his hassock when walking by it. No injury. PTA spoke with pt about increasing his awareness of objects around especially in tight places. About the 30 min mark patient requested to end session due to his fatigue level. At that time pt was demonstrating a significant increase in his lethargy.    Personal Factors and Comorbidities Fitness;Comorbidity 1;Comorbidity 2    Comorbidities 2013 kidney transplant, June: espphagus hospitalization and expansion, 2017 CVA,    Examination-Activity Limitations Locomotion Level;Squat;Stairs;Stand    Stability/Clinical Decision Making Evolving/Moderate complexity    Rehab Potential Good    PT Duration 8 weeks    PT Treatment/Interventions ADLs/Self Care Home Management;Canalith Repostioning;Cryotherapy;DME Instruction;Ultrasound;Traction;Moist Heat;Iontophoresis 4mg /ml Dexamethasone;Gait training;Stair training;Functional mobility training;Therapeutic activities;Therapeutic exercise;Balance training;Neuromuscular re-education;Manual techniques;Patient/family education;Passive range of motion;Energy conservation;Joint Manipulations;Spinal Manipulations;Vestibular;Taping;Vasopneumatic Device    PT Next Visit Plan 10th visit PN: pt too fatigued to perform DGI of BERG, follow up with pt next session on his fatigue ( was very pronounced today) and cont with DGI activities.    PT Home Exercise Plan [id number]    Consulted and Agree with Plan of Care Patient           Patient will benefit from skilled therapeutic intervention in order to improve the following deficits  and impairments:  Abnormal gait, Decreased coordination, Difficulty walking, Decreased safety awareness, Decreased endurance, Decreased activity tolerance, Pain, Impaired flexibility, Decreased balance,  Decreased knowledge of use of DME, Decreased mobility, Decreased strength  Visit Diagnosis: Other abnormalities of gait and mobility  Muscle weakness (generalized)     Problem List Patient Active Problem List   Diagnosis Date Noted  . Peptic stricture of esophagus   . Emesis 06/13/2020  . Weight loss 06/13/2020  . Cough 06/13/2020  . AKI (acute kidney injury) ([place]) 06/13/2020  . Metabolic acidosis 38/18/4037  . Esophageal dysphagia   . 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 DOE (dyspnea on exertion) 08/29/2019  . Overweight (BMI 25.0-29.9) 07/27/2019  . GIB (gastrointestinal bleeding) 10/07/2017  . IFG (impaired fasting glucose) 06/08/2017  . Stroke ([place]) 11/30/2016  . Essential hypertension 01/16/2015  . S/p cadaver renal transplant 11/06/2012  . Chronic kidney disease (CKD), stage IV (severe) ([place]) 03/18/2012  . BENIGN PROSTATIC HYPERTROPHY 04/18/2008  . EXTRINSIC ASTHMA, UNSPECIFIED 12/19/2007  . PSA, INCREASED 12/09/2007  . Hypothyroidism 08/12/2007  . Bipolar disorder ([place]) 07/27/2007  . Hyperlipidemia 07/07/2007  . Gout 07/07/2007  . HYPERTENSION 07/07/2007  . GERD 07/07/2007    Nishat Livingston, PTA 09/09/2020, 11:37 AM  Baruch Merl, PT 09/09/20 2:35 PM   [facility] [address] [place], [place], [address] Phone: [phone]   Fax:  [phone]  Name: Curtis Jimenez MRN: [id number] Date of Birth: [birth year]

## 2020-09-11 ENCOUNTER — Ambulatory Visit: Payer: Medicare HMO | Admitting: Physical Therapy

## 2020-09-17 ENCOUNTER — Other Ambulatory Visit: Payer: Self-pay | Admitting: Family Medicine

## 2020-09-22 NOTE — Progress Notes (Addendum)
   Follow-Up Visit   Subjective  Curtis Jimenez is a 84 y.o. male who presents for the following: Annual Exam (skin check).  New growths Location: Forehead and arm Duration:  Quality:  Associated Signs/Symptoms: Modifying Factors:  Severity:  Timing: Context:   Objective  Well appearing patient in no apparent distress; mood and affect are within normal limits.  All skin waist up examined.   Assessment & Plan    Neoplasm of uncertain behavior of skin (2) Right Forearm - Posterior  Skin / nail biopsy Type of biopsy: tangential   Informed consent: discussed and consent obtained   Timeout: patient name, date of birth, surgical site, and procedure verified   Procedure prep:  Patient was prepped and draped in usual sterile fashion (Non sterile) Prep type:  Chlorhexidine Anesthesia: the lesion was anesthetized in a standard fashion   Anesthetic:  1% lidocaine w/ epinephrine 1-100,000 local infiltration Instrument used: flexible razor blade   Outcome: patient tolerated procedure well   Post-procedure details: wound care instructions given    Specimen 1 - Surgical pathology Differential Diagnosis: wart, scc, bcc Check Margins: No  Left Frontal Scalp  Skin / nail biopsy Type of biopsy: tangential   Informed consent: discussed and consent obtained   Timeout: patient name, date of birth, surgical site, and procedure verified   Procedure prep:  Patient was prepped and draped in usual sterile fashion (Non sterile) Prep type:  Chlorhexidine Anesthesia: the lesion was anesthetized in a standard fashion   Anesthetic:  1% lidocaine w/ epinephrine 1-100,000 local infiltration Instrument used: flexible razor blade   Outcome: patient tolerated procedure well   Post-procedure details: wound care instructions given    Specimen 2 - Surgical pathology Differential Diagnosis: scc vs bcc Check Margins: No  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 Left  Forehead  Skin / nail biopsy - Left Forehead Type of biopsy: tangential   Informed consent: discussed and consent obtained   Timeout: patient name, date of birth, surgical site, and procedure verified   Procedure prep:  Patient was prepped and draped in usual sterile fashion Prep type:  Chlorhexidine Anesthesia: the lesion was anesthetized in a standard fashion   Anesthetic:  1% lidocaine w/ epinephrine 1-100,000 local infiltration Instrument used: flexible razor blade   Hemostasis achieved with: ferric subsulfate   Outcome: patient tolerated procedure well   Post-procedure details: wound care instructions given    Encounter for screening for malignant neoplasm of skin Mid Back  Annual skin examination     I, Lavonna Monarch, MD, have reviewed all documentation for this visit.  The documentation on 10/28/20 for the exam, diagnosis, procedures, and orders are all accurate and complete.

## 2020-09-25 ENCOUNTER — Ambulatory Visit: Payer: Medicare HMO | Admitting: Physical Therapy

## 2020-09-25 ENCOUNTER — Encounter: Payer: Self-pay | Admitting: Physical Therapy

## 2020-09-25 ENCOUNTER — Other Ambulatory Visit: Payer: Self-pay

## 2020-09-25 DIAGNOSIS — R2689 Other abnormalities of gait and mobility: Secondary | ICD-10-CM | POA: Diagnosis not present

## 2020-09-25 DIAGNOSIS — M6281 Muscle weakness (generalized): Secondary | ICD-10-CM | POA: Diagnosis not present

## 2020-09-25 NOTE — Therapy (Signed)
[facility] [address], [place], [place], [address] Phone: [phone]   Fax:  [phone]  Physical Therapy Treatment  Patient Details  Name: Curtis Jimenez MRN: [id number] Date of Birth: Nov 26, 1936 Referring Provider (PT): [place]   Encounter Date: 09/25/2020   PT End of Session - 09/25/20 0935    Visit Number 11    Number of Visits 12    Date for PT Re-Evaluation 10/30/20    Authorization Type [employer]    PT Start Time 0932    PT Stop Time 1011    PT Time Calculation (min) 39 min    Activity Tolerance Patient tolerated treatment well    Behavior During Therapy [facility] for tasks assessed/performed           Past Medical History:  Diagnosis Date  . Basal cell carcinoma    neck (skin MD 2X per year)  . Bifascicular block 2021   ectopy, asymptomatic.  Cards->observe  . Bipolar disorder ([place])   . BPH (benign prostatic hyperplasia)    with elevated PSA; followed by Dr. Rosana Hoes at [facility]  . Chronic renal insufficiency, stage III (moderate)    in pt w/hx of renal transplant.  Post-transplant sCr 1.4-1.6.  Last renal f/u 05/01/19->Cr 1.63, GFR 39 ml/min  . Coronary atherosclerosis    LM and 3 V dz noted on CT 08/2019 performed for eval of interstitial lung dz in the setting of mild DOE and mild hypoxia. Cards->no stress tesing indicated->primary prevention emphasized  . CVA (cerebral vascular accident) ([place]) 11/2016   Pontine (vertebrobasilar--imaging neg), TPA given.  Pt discharged on plavix.  Carotid dopplers ok, echocardiogram ok.  Residual deficit: vertigo but this improved greatly with therapy.  . DOE (dyspnea on exertion) 2020   DOE and ? hypoxia->CXR 08/29/19--->diffuse interstitial opacities-? acute inflammatory process->Dr. Melvyn Novas eval'd him and was underwhelmed by the CXR and exam->CT chest 09/26/19 "Very subtle areas of mild ground-glass attenuatio-nonspecific", mild air trapping. Per Dr. Melvyn Novas, no signif  ILD, improving with inc ambulation (post-inflamm pulm fibrosis?)  . Esophagitis 06/14/2020   EGD->+distal esophagitis, h pylori neg, mild distal esoph stenosis widened with forceps, benign gastric polyps.  . Gallbladder polyp 2015   Asymptomatic  . GERD (gastroesophageal reflux disease)   . Gout   . History of adenomatous polyp of colon 2018   Recall 5 yrs  . Hyperlipidemia   . Hypertension   . Hypothyroidism   . Lumbar spondylosis   . Ptosis due to aging    Right  . Rectal bleeding 09/2017   Admitted for obs due to Hb drop.  GI consulted---obs recommended.  No transfusion required.  Plavix was d/c'd, ferrous sulfate recommended.  Outpt GI f/u--plavix restarted and upper endoscopy was normal and colonoscopy showed 2 polyps and severe diverticular dz. Iron d/c'd 04/24/19.  Marland Kitchen Restless legs syndrome   . S/p cadaver renal transplant 2013   Secondary to HTN +lithium toxicity over 30 yrs caused kidney destruction [facility] transplant MDs)  . Seborrheic dermatitis    eyebrows worst: Hytone rx'd by Dr. Denna Haggard.  . 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 AV FISTULA PLACEMENT  04/08/2012   Procedure: ARTERIOVENOUS (AV) FISTULA CREATION;  Surgeon: Angelia Mould, MD;  Location: [place];  Service:  Vascular;  Laterality: Left;  Creation of left brachial cephalic arteriovenous fistula  . BIOPSY  06/14/2020   Procedure: BIOPSY;  Surgeon: Lavena Bullion, DO;  Location: [facility];  Service: Gastroenterology;;  . CATARACT EXTRACTION, BILATERAL  05/2018  . COLONOSCOPY  2013; 11/24/17   2013; Normal except diverticulosis (recall 10 yrs).  2018 (for GI bleeding)--severe diverticular dz + 2 adenomatous polyps.  Recall 5 yrs.  . DG CHEST AP OR PA ([facility] HX)  06/03/2020    Bibasilar atelectasis. No focal consoliadation definitively identified.  . dilation for GERD    . ESOPHAGOGASTRODUODENOSCOPY  11/24/2017   gastric polyps x 2, otherwise normal.  . ESOPHAGOGASTRODUODENOSCOPY N/A 06/14/2020   +distal esophagitis, h pylori neg, mild distal esoph stenosis widened with forceps, benign gastric polypsProcedure: ESOPHAGOGASTRODUODENOSCOPY (EGD);  Surgeon: Lavena Bullion, DO;  Location: [facility];  Service: Gastroenterology;  Laterality: N/A;  Diagnostic EGD as patietn last took Plavix 11am on 06/12/2020  . KIDNEY TRANSPLANT  11/06/12   [place] (cadaveric)  . PROSTATE BIOPSY  2011   no malignancy  . TRANSTHORACIC ECHOCARDIOGRAM  11/2016   Normal LV systolic fxn, EF 38-25%.  Grade I DD.  Mild aortic root dilatation, mild MV regurg.    There were no vitals filed for this visit.   Subjective Assessment - 09/25/20 0934    Subjective i had to take a little beak from PT becasue of significant back spasms. He reports not being able to walk much. I did my exercises and rested.    Pertinent History 2013 kidney transplant, June: espphagus hospitalization and expansion, 2017 CVA,    Currently in Pain? Yes    Pain Score 1     Pain Location Back    Pain Orientation Lower;Right;Left    Pain Descriptors / Indicators Dull;Aching    Aggravating Factors  In the morning    Pain Relieving Factors In the afternoon, using cane    Multiple Pain Sites No                             [facility] Adult PT Treatment/Exercise - 09/25/20 0001      Neuro Re-ed    Neuro Re-ed Details  level surface alt toe taps, lateral steps 4 " box 10x bil, walking with head turns/change of speeds..      Lumbar Exercises: Standing   Heel Raises 10 reps    Heel Raises Limitations Vc to make bigger movements    Other Standing Lumbar Exercises marching 10x no UE support    Other Standing Lumbar Exercises Bil hip abd 10x 2 finger support      Knee/Hip Exercises: Seated   Long Arc Quad  Strengthening;Both;2 sets;10 reps;Weights    Long Arc Quad Weight --   2.5   Clamshell with TheraBand --   green loop 2x10   Knee/Hip Flexion --   green loop 2x10   Sit to Sand 2 sets;5 reps;without UE support   4# wt     Moist Heat Therapy   Moist Heat Location --   To lumbar for seated ex                   PT Short Term Goals - 08/27/20 1536      PT SHORT TERM GOAL #1   Title Pt to be independent with intial HEP    Status Achieved      PT SHORT TERM GOAL #2   Title Pt  to demo ability for stand/wait after sit to stand, to unsure no dizziness    Baseline every now and then light headed due to low BP    Status Achieved      PT SHORT TERM GOAL #3   Title -      PT SHORT TERM GOAL #4   Title -      PT SHORT TERM GOAL #5   Title -             PT Long Term Goals - 09/09/20 1133      PT LONG TERM GOAL #1   Title Pt to be independent with final HEP    Baseline progressing balance challenges    Period Weeks    Status On-going      PT LONG TERM GOAL #2   Baseline 46/56 08/27/20, previously 42/56    Time 6    Period Weeks    Status On-going      PT LONG TERM GOAL #3   Title Pt to demo improved score on DGI by at least 5 points    Baseline improved from 15 to 16 points    Time 6    Period Weeks    Status On-going      PT LONG TERM GOAL #4   Title Pt to independenty/safely navigate at least 5 steps with 1 hand rail.    Time 6    Period Weeks    Status Achieved                 Plan - 09/25/20 0945    Clinical Impression Statement pt has had to take  2 week break from therapy due to intense back spasms that limoited his ablility to stand for long periods of time and walk. He presents today with very minimal back pain. He reports he was able to continue most of his exercises at home but does walk with a cane now for back support. Pt had no issue with seated exercises using resistance for his hip and knee strength. When standing he required a rest  break for his back ( fatigue) often.    Personal Factors and Comorbidities Fitness;Comorbidity 1;Comorbidity 2    Comorbidities 2013 kidney transplant, June: espphagus hospitalization and expansion, 2017 CVA,    Examination-Activity Limitations Locomotion Level;Squat;Stairs;Stand    Examination-Participation Restrictions Volunteer    Stability/Clinical Decision Making Evolving/Moderate complexity    Rehab Potential Good    PT Frequency 2x / week    PT Duration 8 weeks    PT Treatment/Interventions ADLs/Self Care Home Management;Canalith Repostioning;Cryotherapy;DME Instruction;Ultrasound;Traction;Moist Heat;Iontophoresis 4mg /ml Dexamethasone;Gait training;Stair training;Functional mobility training;Therapeutic activities;Therapeutic exercise;Balance training;Neuromuscular re-education;Manual techniques;Patient/family education;Passive range of motion;Energy conservation;Joint Manipulations;Spinal Manipulations;Vestibular;Taping;Vasopneumatic Device    PT Next Visit Plan ERO/auth for more visits look time frame is ok till early Nov.    [place]    Consulted and Agree with Plan of Care Patient           Patient will benefit from skilled therapeutic intervention in order to improve the following deficits and impairments:  Abnormal gait, Decreased coordination, Difficulty walking, Decreased safety awareness, Decreased endurance, Decreased activity tolerance, Pain, Impaired flexibility, Decreased balance, Decreased knowledge of use of DME, Decreased mobility, Decreased strength  Visit Diagnosis: Other abnormalities of gait and mobility  Muscle weakness (generalized)     Problem List Patient Active Problem List   Diagnosis Date Noted  . Peptic stricture of esophagus   . Emesis 06/13/2020  . Weight  loss 06/13/2020  . Cough 06/13/2020  . AKI (acute kidney injury) ([place]) 06/13/2020  . Metabolic acidosis 14/70/9295  . Esophageal dysphagia   . 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 DOE (dyspnea on exertion) 08/29/2019  . Overweight (BMI 25.0-29.9) 07/27/2019  . GIB (gastrointestinal bleeding) 10/07/2017  . IFG (impaired fasting glucose) 06/08/2017  . Stroke ([place]) 11/30/2016  . Essential hypertension 01/16/2015  . S/p cadaver renal transplant 11/06/2012  . Chronic kidney disease (CKD), stage IV (severe) ([place]) 03/18/2012  . BENIGN PROSTATIC HYPERTROPHY 04/18/2008  . EXTRINSIC ASTHMA, UNSPECIFIED 12/19/2007  . PSA, INCREASED 12/09/2007  . Hypothyroidism 08/12/2007  . Bipolar disorder ([place]) 07/27/2007  . Hyperlipidemia 07/07/2007  . Gout 07/07/2007  . HYPERTENSION 07/07/2007  . GERD 07/07/2007    Deem Marmol, PTA 09/25/2020, 11:00 AM  [facility] [address], [place], [place], [address] Phone: [phone]   Fax:  [phone]  Name: Curtis Jimenez MRN: [id number] Date of Birth: 01/31/1936

## 2020-10-01 ENCOUNTER — Other Ambulatory Visit: Payer: Self-pay

## 2020-10-01 ENCOUNTER — Ambulatory Visit: Payer: Medicare HMO | Attending: Family Medicine | Admitting: Physical Therapy

## 2020-10-01 ENCOUNTER — Encounter: Payer: Self-pay | Admitting: Physical Therapy

## 2020-10-01 DIAGNOSIS — M6281 Muscle weakness (generalized): Secondary | ICD-10-CM | POA: Diagnosis not present

## 2020-10-01 DIAGNOSIS — R2689 Other abnormalities of gait and mobility: Secondary | ICD-10-CM | POA: Diagnosis not present

## 2020-10-01 NOTE — Therapy (Signed)
[facility] [address] [place], [place], [address] Phone: [phone]   Fax:  [phone]  Physical Therapy Treatment  Patient Details  Name: Curtis Jimenez MRN: [id number] Date of Birth: 1936/10/10 Referring Provider (Curtis Jimenez): McGowen   Encounter Date: 10/01/2020   Curtis Jimenez End of Session - 10/01/20 1536    Visit Number 12    Number of Visits 20    Date for Curtis Jimenez Re-Evaluation 10/30/20    Authorization Type Aetna    Curtis Jimenez Start Time 1533    Curtis Jimenez Stop Time 1612    Curtis Jimenez Time Calculation (min) 39 min    Equipment Utilized During Treatment Gait belt    Activity Tolerance Patient tolerated treatment well    Behavior During Therapy WFL for tasks assessed/performed           Past Medical History:  Diagnosis Date  . Basal cell carcinoma    neck (skin MD 2X per year)  . Bifascicular block 2021   ectopy, asymptomatic.  Cards->observe  . Bipolar disorder ([place])   . BPH (benign prostatic hyperplasia)    with elevated PSA; followed by Dr. Rosana Hoes at [facility]  . Chronic renal insufficiency, stage III (moderate) (HCC)    in Curtis Jimenez w/hx of renal transplant.  Post-transplant sCr 1.4-1.6.  Last renal f/u 05/01/19->Cr 1.63, GFR 39 ml/min  . Coronary atherosclerosis    LM and 3 V dz noted on CT 08/2019 performed for eval of interstitial lung dz in the setting of mild DOE and mild hypoxia. Cards->no stress tesing indicated->primary prevention emphasized  . CVA (cerebral vascular accident) ([place]) 11/2016   Pontine (vertebrobasilar--imaging neg), TPA given.  Curtis Jimenez discharged on plavix.  Carotid dopplers ok, echocardiogram ok.  Residual deficit: vertigo but this improved greatly with therapy.  . DOE (dyspnea on exertion) 2020   DOE and ? hypoxia->CXR 08/29/19--->diffuse interstitial opacities-? acute inflammatory process->Dr. Melvyn Novas eval'd him and was underwhelmed by the CXR and exam->CT chest 09/26/19 "Very subtle areas of mild ground-glass  attenuatio-nonspecific", mild air trapping. Per Dr. Melvyn Novas, no signif ILD, improving with inc ambulation (post-inflamm pulm fibrosis?)  . Esophagitis 06/14/2020   EGD->+distal esophagitis, h pylori neg, mild distal esoph stenosis widened with forceps, benign gastric polyps.  . Gallbladder polyp 2015   Asymptomatic  . GERD (gastroesophageal reflux disease)   . Gout   . History of adenomatous polyp of colon 2018   Recall 5 yrs  . Hyperlipidemia   . Hypertension   . Hypothyroidism   . Lumbar spondylosis   . Ptosis due to aging    Right  . Rectal bleeding 09/2017   Admitted for obs due to Hb drop.  GI consulted---obs recommended.  No transfusion required.  Plavix was d/c'd, ferrous sulfate recommended.  Outpt GI f/u--plavix restarted and upper endoscopy was normal and colonoscopy showed 2 polyps and severe diverticular dz. Iron d/c'd 04/24/19.  Marland Kitchen Restless legs syndrome   . S/p cadaver renal transplant 2013   Secondary to HTN +lithium toxicity over 30 yrs caused kidney destruction [facility] transplant MDs)  . Seborrheic dermatitis    eyebrows worst: Hytone rx'd by Dr. Denna Haggard.  . 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 AV FISTULA PLACEMENT  04/08/2012   Procedure: ARTERIOVENOUS (AV) FISTULA CREATION;  Surgeon:  Angelia Mould, MD;  Location: [facility] OR;  Service: Vascular;  Laterality: Left;  Creation of left brachial cephalic arteriovenous fistula  . BIOPSY  06/14/2020   Procedure: BIOPSY;  Surgeon: Lavena Bullion, DO;  Location: [facility];  Service: Gastroenterology;;  . CATARACT EXTRACTION, BILATERAL  05/2018  . COLONOSCOPY  2013; 11/24/17   2013; Normal except diverticulosis (recall 10 yrs).  2018 (for GI bleeding)--severe diverticular dz + 2 adenomatous  polyps.  Recall 5 yrs.  . DG CHEST AP OR PA ([facility] HX)  06/03/2020   Bibasilar atelectasis. No focal consoliadation definitively identified.  . dilation for GERD    . ESOPHAGOGASTRODUODENOSCOPY  11/24/2017   gastric polyps x 2, otherwise normal.  . ESOPHAGOGASTRODUODENOSCOPY N/A 06/14/2020   +distal esophagitis, h pylori neg, mild distal esoph stenosis widened with forceps, benign gastric polypsProcedure: ESOPHAGOGASTRODUODENOSCOPY (EGD);  Surgeon: Lavena Bullion, DO;  [facility];  Service: Gastroenterology;  Laterality: N/A;  Diagnostic EGD as patietn last took Plavix 11am on 06/12/2020  . KIDNEY TRANSPLANT  11/06/12   [place] (cadaveric)  . PROSTATE BIOPSY  2011   no malignancy  . TRANSTHORACIC ECHOCARDIOGRAM  11/2016   Normal LV systolic fxn, EF 02-63%.  Grade I DD.  Mild aortic root dilatation, mild MV regurg.    There were no vitals filed for this visit.   Subjective Assessment - 10/01/20 1535    Subjective Back spasm continue but I am good to work out. I am getting better at standing with eyes closed but it is improving.    Pertinent History 2013 kidney transplant, June: espphagus hospitalization and expansion, 2017 CVA,    Limitations Lifting;Standing;Walking;House hold activities    Currently in Pain? Yes    Pain Score 3     Pain Location Back    Pain Orientation Lower;Left;Right    Pain Descriptors / Indicators Aching;Tightness    Pain Type Chronic pain                             [facility] Adult Curtis Jimenez Treatment/Exercise - 10/01/20 0001      Ambulation/Gait   Gait Comments gait with head turns Lt/Rt (VCs for foot placement and not crossing over feet), change in speed, step overs with cue for heel strike, cone weaving - all with close supervision or CGA      Lumbar Exercises: Aerobic   Nustep L2 x 6' with lumbar heat, Curtis Jimenez present to discuss plan for session today      Lumbar Exercises: Standing   Heel Raises 20 reps    Heel Raises Limitations no  hands      Knee/Hip Exercises: Seated   Long Arc Quad Strengthening;Both;1 set;10 reps;Weights    Long Arc Quad Weight 3 lbs.    Marching Both;20 reps;Weights    Marching Limitations 3    Abd/Adduction Limitations green band 1x15    Sit to Sand 2 sets;5 reps;without UE support   green loop band and mirror for hip abductors              Balance Exercises - 10/01/20 0001      Balance Exercises: Standing   Standing Eyes Opened Narrow base of support (BOS);Head turns;20 secs    Tandem Stance Eyes closed;Upper extremity support 2;2 reps;30 secs    Standing, One Foot on a Step Eyes open;15 secs;4 inch;2 reps   fingertip support bil  Curtis Jimenez Short Term Goals - 08/27/20 1536      Curtis Jimenez SHORT TERM GOAL #1   Title Curtis Jimenez to be independent with intial HEP    Status Achieved      Curtis Jimenez SHORT TERM GOAL #2   Title Curtis Jimenez to demo ability for stand/wait after sit to stand, to unsure no dizziness    Baseline every now and then light headed due to low BP    Status Achieved      Curtis Jimenez SHORT TERM GOAL #3   Title -      Curtis Jimenez SHORT TERM GOAL #4   Title -      Curtis Jimenez SHORT TERM GOAL #5   Title -             Curtis Jimenez Long Term Goals - 09/09/20 1133      Curtis Jimenez LONG TERM GOAL #1   Title Curtis Jimenez to be independent with final HEP    Baseline progressing balance challenges    Period Weeks    Status On-going      Curtis Jimenez LONG TERM GOAL #2   Baseline 46/56 08/27/20, previously 42/56    Time 6    Period Weeks    Status On-going      Curtis Jimenez LONG TERM GOAL #3   Title Curtis Jimenez to demo improved score on DGI by at least 5 points    Baseline improved from 15 to 16 points    Time 6    Period Weeks    Status On-going      Curtis Jimenez LONG TERM GOAL #4   Title Curtis Jimenez to independenty/safely navigate at least 5 steps with 1 hand rail.    Time 6    Period Weeks    Status Achieved                 Plan - 10/01/20 1718    Clinical Impression Statement Curtis Jimenez is making great strides toward balance and dynamic gait.  He needed  reminders and cues to avoid narrow foot placement during gait with head turns and with tight cone weaving.  His feet drift to follow his gaze putting him at risk of tripping himself or falling due to narrow BOS.  He was able to improve this approx 75% of the time with repeated reps and cueing.  He continues to report back spasms but declined Curtis Jimenez manual therapy for this, wanting to work on ther ex instead.  He was able to perform all tasks for balance, gait and strength without exacerbation of pain today.  Heat was used on lumbar region during warm up for his back spasms which helped.  Curtis Jimenez providing ongoing assessment and close supervision or CGA during gait and balance tasks.    Comorbidities 2013 kidney transplant, June: espphagus hospitalization and expansion, 2017 CVA,    Rehab Potential Good    Curtis Jimenez Frequency 2x / week    Curtis Jimenez Duration 8 weeks    Curtis Jimenez Treatment/Interventions ADLs/Self Care Home Management;Canalith Repostioning;Cryotherapy;DME Instruction;Ultrasound;Traction;Moist Heat;Iontophoresis 4mg /ml Dexamethasone;Gait training;Stair training;Functional mobility training;Therapeutic activities;Therapeutic exercise;Balance training;Neuromuscular re-education;Manual techniques;Patient/family education;Passive range of motion;Energy conservation;Joint Manipulations;Spinal Manipulations;Vestibular;Taping;Vasopneumatic Device    Curtis Jimenez Next Visit Plan continue dynamic gait, balance with narrow BOS and eyes closed, repeat or progress time with SLS with foot on step, sit to stand with knee valgus control    Curtis Jimenez Home Exercise Plan [email or web address]    Recommended Other Services end of cert Nov 3    Consulted and Agree with Plan of Care Patient  Patient will benefit from skilled therapeutic intervention in order to improve the following deficits and impairments:     Visit Diagnosis: Other abnormalities of gait and mobility  Muscle weakness (generalized)     Problem List Patient Active Problem List    Diagnosis Date Noted  . Peptic stricture of esophagus   . Emesis 06/13/2020  . Weight loss 06/13/2020  . Cough 06/13/2020  . AKI (acute kidney injury) ([place]) 06/13/2020  . Metabolic acidosis 53/20/2334  . Esophageal dysphagia   . Postinflammatory pulmonary fibrosis ([place]) 08/30/2019  . DOE (dyspnea on exertion) 08/29/2019  . Overweight (BMI 25.0-29.9) 07/27/2019  . GIB (gastrointestinal bleeding) 10/07/2017  . IFG (impaired fasting glucose) 06/08/2017  . Stroke ([place]) 11/30/2016  . Essential hypertension 01/16/2015  . S/p cadaver renal transplant 11/06/2012  . Chronic kidney disease (CKD), stage IV (severe) () 03/18/2012  . BENIGN PROSTATIC HYPERTROPHY 04/18/2008  . EXTRINSIC ASTHMA, UNSPECIFIED 12/19/2007  . PSA, INCREASED 12/09/2007  . Hypothyroidism 08/12/2007  . Bipolar disorder ([place]) 07/27/2007  . Hyperlipidemia 07/07/2007  . Gout 07/07/2007  . HYPERTENSION 07/07/2007  . GERD 07/07/2007    Curtis Jimenez, Curtis Jimenez 10/01/20 5:24 PM    [facility] [address] [place], [place], [address] Phone: [phone]   Fax:  [phone]  Name: Curtis Jimenez MRN: [id number] Date of Birth: July 01, 1936

## 2020-10-04 ENCOUNTER — Encounter: Payer: Medicare HMO | Admitting: Physical Therapy

## 2020-10-07 ENCOUNTER — Ambulatory Visit: Payer: Medicare HMO | Admitting: Physical Therapy

## 2020-10-07 ENCOUNTER — Other Ambulatory Visit: Payer: Self-pay

## 2020-10-07 ENCOUNTER — Encounter: Payer: Self-pay | Admitting: Physical Therapy

## 2020-10-07 DIAGNOSIS — R2689 Other abnormalities of gait and mobility: Secondary | ICD-10-CM

## 2020-10-07 DIAGNOSIS — M6281 Muscle weakness (generalized): Secondary | ICD-10-CM | POA: Diagnosis not present

## 2020-10-07 NOTE — Therapy (Signed)
[facility] [address] [place], [place], [address] Phone: [phone]   Fax:  [phone]  Physical Therapy Treatment  Patient Details  Name: Curtis Jimenez MRN: [id number] Date of Birth: [birth year] Referring Provider (PT): McGowen   Encounter Date: 10/07/2020   PT End of Session - 10/07/20 1357    Visit Number 13    Number of Visits 20    Date for PT Re-Evaluation 10/30/20    Authorization Type Aetna    PT Start Time 8182    PT Stop Time 1434    PT Time Calculation (min) 36 min    Activity Tolerance Patient tolerated treatment well    Behavior During Therapy [facility] for tasks assessed/performed           Past Medical History:  Diagnosis Date  . Basal cell carcinoma    neck (skin MD 2X per year)  . Bifascicular block 2021   ectopy, asymptomatic.  Cards->observe  . Bipolar disorder ([place])   . BPH (benign prostatic hyperplasia)    with elevated PSA; followed by Dr. Rosana Hoes at [facility]  . Chronic renal insufficiency, stage III (moderate) (HCC)    in pt w/hx of renal transplant.  Post-transplant sCr 1.4-1.6.  Last renal f/u 05/01/19->Cr 1.63, GFR 39 ml/min  . Coronary atherosclerosis    LM and 3 V dz noted on CT 08/2019 performed for eval of interstitial lung dz in the setting of mild DOE and mild hypoxia. Cards->no stress tesing indicated->primary prevention emphasized  . CVA (cerebral vascular accident) ([place]) 11/2016   Pontine (vertebrobasilar--imaging neg), TPA given.  Pt discharged on plavix.  Carotid dopplers ok, echocardiogram ok.  Residual deficit: vertigo but this improved greatly with therapy.  . DOE (dyspnea on exertion) 2020   DOE and ? hypoxia->CXR 08/29/19--->diffuse interstitial opacities-? acute inflammatory process->Dr. Melvyn Novas eval'd him and was underwhelmed by the CXR and exam->CT chest 09/26/19 "Very subtle areas of mild ground-glass attenuatio-nonspecific", mild air trapping. Per Dr. Melvyn Novas, no  signif ILD, improving with inc ambulation (post-inflamm pulm fibrosis?)  . Esophagitis 06/14/2020   EGD->+distal esophagitis, h pylori neg, mild distal esoph stenosis widened with forceps, benign gastric polyps.  . Gallbladder polyp 2015   Asymptomatic  . GERD (gastroesophageal reflux disease)   . Gout   . History of adenomatous polyp of colon 2018   Recall 5 yrs  . Hyperlipidemia   . Hypertension   . Hypothyroidism   . Lumbar spondylosis   . Ptosis due to aging    Right  . Rectal bleeding 09/2017   Admitted for obs due to Hb drop.  GI consulted---obs recommended.  No transfusion required.  Plavix was d/c'd, ferrous sulfate recommended.  Outpt GI f/u--plavix restarted and upper endoscopy was normal and colonoscopy showed 2 polyps and severe diverticular dz. Iron d/c'd 04/24/19.  Marland Kitchen Restless legs syndrome   . S/p cadaver renal transplant 2013   Secondary to HTN +lithium toxicity over 30 yrs caused kidney destruction [facility] transplant MDs)  . Seborrheic dermatitis    eyebrows worst: Hytone rx'd by Dr. Denna Haggard.  . 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 AV FISTULA PLACEMENT  04/08/2012   Procedure: ARTERIOVENOUS (AV) FISTULA CREATION;  Surgeon: Angelia Mould, MD;  Location: [place];  Service: Vascular;  Laterality: Left;  Creation of left brachial cephalic arteriovenous fistula  . BIOPSY  06/14/2020   Procedure: BIOPSY;  Surgeon: Lavena Bullion, DO;  [facility];  Service: Gastroenterology;;  . CATARACT EXTRACTION, BILATERAL  05/2018  . COLONOSCOPY  2013; 11/24/17   2013; Normal except diverticulosis (recall 10 yrs).  2018 (for GI bleeding)--severe diverticular dz + 2 adenomatous polyps.  Recall 5 yrs.  . DG CHEST AP OR PA ([facility] HX)   06/03/2020   Bibasilar atelectasis. No focal consoliadation definitively identified.  . dilation for GERD    . ESOPHAGOGASTRODUODENOSCOPY  11/24/2017   gastric polyps x 2, otherwise normal.  . ESOPHAGOGASTRODUODENOSCOPY N/A 06/14/2020   +distal esophagitis, h pylori neg, mild distal esoph stenosis widened with forceps, benign gastric polypsProcedure: ESOPHAGOGASTRODUODENOSCOPY (EGD);  Surgeon: Lavena Bullion, DO;  [facility];  Service: Gastroenterology;  Laterality: N/A;  Diagnostic EGD as patietn last took Plavix 11am on 06/12/2020  . KIDNEY TRANSPLANT  11/06/12   [place] (cadaveric)  . PROSTATE BIOPSY  2011   no malignancy  . TRANSTHORACIC ECHOCARDIOGRAM  11/2016   Normal LV systolic fxn, EF 34-19%.  Grade I DD.  Mild aortic root dilatation, mild MV regurg.    There were no vitals filed for this visit.   Subjective Assessment - 10/07/20 1359    Subjective Back continues to do better each week. Ambulating with cane today    Pertinent History 2013 kidney transplant, June: espphagus hospitalization and expansion, 2017 CVA,    Currently in Pain? Yes    Pain Score 3     Pain Location Back    Pain Orientation Right;Lower    Pain Descriptors / Indicators Aching    Aggravating Factors  worse in the morning    Pain Relieving Factors using cane helps    Multiple Pain Sites No                             [facility] Adult PT Treatment/Exercise - 10/07/20 0001      Neuro Re-ed    Neuro Re-ed Details  Black foam eyes open: marching 10x, eyes closed , tandem stance all CGA       Lumbar Exercises: Aerobic   Nustep L x x 8 min with PTA present      Lumbar Exercises: Standing   Heel Raises 20 reps    Heel Raises Limitations no hands      Knee/Hip Exercises: Machines for Strengthening   Cybex Leg Press seat 8: 90# 20x BiLE  110# BilLE 15x      Knee/Hip Exercises: Standing   Hip Flexion AROM;Both;1 set;20 reps;Knee bent    Hip Flexion Limitations No holding on     Rebounder Wt shifting 3 ways 1 min each light UE    Other Standing Knee Exercises Alt toe tapping 20 sec 2x bil No UE       Knee/Hip Exercises: Seated   Long Arc Quad Strengthening;Both;1 set;10 reps;Weights    Long Arc Quad Weight 3 lbs.    Abd/Adduction Limitations yellow loop 20x    Sit to Sand 2 sets;5 reps;without UE support   yellow loop band and mirror for hip abductors                   PT Short Term Goals - 08/27/20 1536      PT SHORT TERM GOAL #1   Title Pt to be independent with intial  HEP    Status Achieved      PT SHORT TERM GOAL #2   Title Pt to demo ability for stand/wait after sit to stand, to unsure no dizziness    Baseline every now and then light headed due to low BP    Status Achieved      PT SHORT TERM GOAL #3   Title -      PT SHORT TERM GOAL #4   Title -      PT SHORT TERM GOAL #5   Title -             PT Long Term Goals - 09/09/20 1133      PT LONG TERM GOAL #1   Title Pt to be independent with final HEP    Baseline progressing balance challenges    Period Weeks    Status On-going      PT LONG TERM GOAL #2   Baseline 46/56 08/27/20, previously 42/56    Time 6    Period Weeks    Status On-going      PT LONG TERM GOAL #3   Title Pt to demo improved score on DGI by at least 5 points    Baseline improved from 15 to 16 points    Time 6    Period Weeks    Status On-going      PT LONG TERM GOAL #4   Title Pt to independenty/safely navigate at least 5 steps with 1 hand rail.    Time 6    Period Weeks    Status Achieved                 Plan - 10/07/20 1358    Clinical Impression Statement Pt arrives with min low back pain that did not limit todays session at all. Pt performed static and dynamic balance exercises with no obvious loss of balance and at times requiring CGA.    Personal Factors and Comorbidities Fitness;Comorbidity 1;Comorbidity 2    Comorbidities 2013 kidney transplant, June: espphagus hospitalization  and expansion, 2017 CVA,    Examination-Activity Limitations Locomotion Level;Squat;Stairs;Stand    Examination-Participation Restrictions Volunteer    Stability/Clinical Decision Making Evolving/Moderate complexity    Rehab Potential Good    PT Frequency 2x / week    PT Duration 8 weeks    PT Treatment/Interventions ADLs/Self Care Home Management;Canalith Repostioning;Cryotherapy;DME Instruction;Ultrasound;Traction;Moist Heat;Iontophoresis 4mg /ml Dexamethasone;Gait training;Stair training;Functional mobility training;Therapeutic activities;Therapeutic exercise;Balance training;Neuromuscular re-education;Manual techniques;Patient/family education;Passive range of motion;Energy conservation;Joint Manipulations;Spinal Manipulations;Vestibular;Taping;Vasopneumatic Device    PT Next Visit Plan continue dynamic gait, balance with narrow BOS and eyes closed, repeat or progress time with SLS with foot on step, sit to stand with knee valgus control    PT Home Exercise Plan [id number]    Consulted and Agree with Plan of Care Patient           Patient will benefit from skilled therapeutic intervention in order to improve the following deficits and impairments:  Abnormal gait, Decreased coordination, Difficulty walking, Decreased safety awareness, Decreased endurance, Decreased activity tolerance, Pain, Impaired flexibility, Decreased balance, Decreased knowledge of use of DME, Decreased mobility, Decreased strength  Visit Diagnosis: Other abnormalities of gait and mobility  Muscle weakness (generalized)     Problem List Patient Active Problem List   Diagnosis Date Noted  . Peptic stricture of esophagus   . Emesis 06/13/2020  . Weight loss 06/13/2020  . Cough 06/13/2020  . AKI (acute kidney injury) ([place]) 06/13/2020  . Metabolic acidosis 32/99/2426  .  Esophageal dysphagia   . Postinflammatory pulmonary fibrosis ([place]) 08/30/2019  . DOE (dyspnea on exertion) 08/29/2019  . Overweight (BMI  25.0-29.9) 07/27/2019  . GIB (gastrointestinal bleeding) 10/07/2017  . IFG (impaired fasting glucose) 06/08/2017  . Stroke ([place]) 11/30/2016  . Essential hypertension 01/16/2015  . S/p cadaver renal transplant 11/06/2012  . Chronic kidney disease (CKD), stage IV (severe) ([place]) 03/18/2012  . BENIGN PROSTATIC HYPERTROPHY 04/18/2008  . EXTRINSIC ASTHMA, UNSPECIFIED 12/19/2007  . PSA, INCREASED 12/09/2007  . Hypothyroidism 08/12/2007  . Bipolar disorder ([place]) 07/27/2007  . Hyperlipidemia 07/07/2007  . Gout 07/07/2007  . HYPERTENSION 07/07/2007  . GERD 07/07/2007    Florentine Diekman, PTA 10/07/2020, 2:34 PM  [facility] [address] [place], [place], [address] Phone: [phone]   Fax:  [phone]  Name: Curtis Jimenez MRN: [id number] Date of Birth: 1936/01/26

## 2020-10-09 ENCOUNTER — Ambulatory Visit: Payer: Medicare HMO | Admitting: Vascular Surgery

## 2020-10-09 ENCOUNTER — Other Ambulatory Visit: Payer: Self-pay

## 2020-10-09 ENCOUNTER — Encounter: Payer: Self-pay | Admitting: Vascular Surgery

## 2020-10-09 VITALS — BP 124/69 | HR 73 | Temp 98.4°F | Resp 20 | Ht 69.0 in | Wt 180.0 lb

## 2020-10-09 DIAGNOSIS — Z992 Dependence on renal dialysis: Secondary | ICD-10-CM

## 2020-10-09 DIAGNOSIS — N186 End stage renal disease: Secondary | ICD-10-CM

## 2020-10-09 NOTE — Progress Notes (Signed)
REASON FOR CONSULT:    Aneurysmal left upper arm fistula.  The consult is requested by Dr. Corliss Parish.  ASSESSMENT & PLAN:   ANEURYSMAL LEFT UPPER ARM FISTULA: This patient has an aneurysmal left upper arm fistula.  He does not have any steal symptoms.  He likely has some mild outflow obstruction as the fistula is somewhat pulsatile.  I explained that there are 3 options.  First, we can simply leave the fistula as it is not causing any symptoms and certainly if he had problems with his kidney transplant this would provide a backup for dialysis.  I think this is the best option and he is agreeable with this plan.  Alternatively, we could consider ligating the fistula as he is somewhat concerned about it causing congestive heart failure.  However currently I do not think he has any evidence of this.  Likewise he does not have any evidence of steal symptoms.  Finally, we could proceed with a fistulogram to look for an outflow stenosis.  However this could potentially result in steal symptoms as he initially had steal symptoms after his fistula was placed.  Thus we will plan to leave the fistula for now unless he decides otherwise.   Deitra Mayo, MD Office: [phone]   HPI:   Curtis Jimenez is a pleasant 84 y.o. male, who is referred with an aneurysmal left upper arm fistula.  I have reviewed the records from the referring office.  The patient was seen on 08/30/2020.  He has stage III chronic kidney disease.  He underwent a cadaveric renal transplant in November 2013.  At the time of that visit he was concerned about his aneurysmal left upper arm fistula.  He was sent for vascular consultations.  The patient had a cadaveric renal transplant in 2013 which has been working well.  Previously had this fistula placed and this is never been used.  He had some concerns about this potentially causing congestive heart failure.  However I do not see any evidence of congestive heart  failure in his records.  He tells me that he did have some steal symptoms in his left upper extremity after the fistula was placed but the symptoms all resolved.  Past Medical History:  Diagnosis Date  . Basal cell carcinoma    neck (skin MD 2X per year)  . Bifascicular block 2021   ectopy, asymptomatic.  Cards->observe  . Bipolar disorder ([place])   . BPH (benign prostatic hyperplasia)    with elevated PSA; followed by Dr. Rosana Hoes at [facility]  . Chronic renal insufficiency, stage III (moderate) (HCC)    in pt w/hx of renal transplant.  Post-transplant sCr 1.4-1.6.  Last renal f/u 05/01/19->Cr 1.63, GFR 39 ml/min  . Coronary atherosclerosis    LM and 3 V dz noted on CT 08/2019 performed for eval of interstitial lung dz in the setting of mild DOE and mild hypoxia. Cards->no stress tesing indicated->primary prevention emphasized  . CVA (cerebral vascular accident) ([place]) 11/2016   Pontine (vertebrobasilar--imaging neg), TPA given.  Pt discharged on plavix.  Carotid dopplers ok, echocardiogram ok.  Residual deficit: vertigo but this improved greatly with therapy.  . DOE (dyspnea on exertion) 2020   DOE and ? hypoxia->CXR 08/29/19--->diffuse interstitial opacities-? acute inflammatory process->Dr. Melvyn Novas eval'd him and was underwhelmed by the CXR and exam->CT chest 09/26/19 "Very subtle areas of mild ground-glass attenuatio-nonspecific", mild air trapping. Per Dr. Melvyn Novas, no signif ILD, improving with inc ambulation (post-inflamm pulm fibrosis?)  .  Esophagitis 06/14/2020   EGD->+distal esophagitis, h pylori neg, mild distal esoph stenosis widened with forceps, benign gastric polyps.  . Gallbladder polyp 2015   Asymptomatic  . GERD (gastroesophageal reflux disease)   . Gout   . History of adenomatous polyp of colon 2018   Recall 5 yrs  . Hyperlipidemia   . Hypertension   . Hypothyroidism   . Lumbar spondylosis   . Ptosis due to aging    Right  . Rectal bleeding 09/2017   Admitted for obs due to  Hb drop.  GI consulted---obs recommended.  No transfusion required.  Plavix was d/c'd, ferrous sulfate recommended.  Outpt GI f/u--plavix restarted and upper endoscopy was normal and colonoscopy showed 2 polyps and severe diverticular dz. Iron d/c'd 04/24/19.  Marland Kitchen Restless legs syndrome   . S/p cadaver renal transplant 2013   Secondary to HTN +lithium toxicity over 30 yrs caused kidney destruction [facility] transplant MDs)  . Seborrheic dermatitis    eyebrows worst: Hytone rx'd by Dr. Denna Haggard.  . 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 Ulcers Mother        GI bleed   . Heart disease Father   . Bladder Cancer Father   . Colon cancer Neg Hx   . Esophageal cancer Neg Hx   . Pancreatic cancer Neg Hx   . Rectal cancer Neg Hx   . Stomach cancer Neg Hx     SOCIAL HISTORY: Social History   Socioeconomic History  . Marital status: Married    Spouse name: Not on file  . Number of children: Not on file  . Years of education: Not on file  . Highest education level: Not on file  Occupational History  . Not on file  Tobacco Use  . Smoking status: Former Smoker    Years: 16.00    Types: Pipe, Cigars    Quit date: 05/11/1974    Years since quitting: 46.4  . Smokeless tobacco: Never Used  Vaping Use  . Vaping Use: Never used  Substance and Sexual Activity  . Alcohol use: No  . Drug use: No  . Sexual activity: Not Currently    Partners: Female  Other Topics Concern  . Not on file  Social History Narrative   Married, 4 children, 9 GGC, [place].   Occupation: retired Marine scientist.  Originally from [place] [employer].   Tobacco: quit 1975; smoked pipes and cigars x 15 yrs prior to this.   Alcohol: none.   Exercise: minimal, but is going to sign up for silver sneakers at the  [facility].   Social Determinants of Health   Financial Resource Strain:   . Difficulty of Paying Living Expenses: Not on file  Food Insecurity:   . Worried About Charity fundraiser in the Last Year: Not on file  . Ran Out of Food in the Last Year: Not on file  Transportation Needs: No Transportation Needs  . Lack of Transportation (Medical): No  . Lack of Transportation (Non-Medical): No  Physical Activity:   . Days of Exercise per Week: Not on file  . Minutes of Exercise per Session: Not on file  Stress:   . Feeling of Stress : Not on file  Social Connections:   . Frequency of Communication with Friends and  Family: Not on file  . Frequency of Social Gatherings with Friends and Family: Not on file  . Attends Religious Services: Not on file  . Active Member of Clubs or Organizations: Not on file  . Attends Archivist Meetings: Not on file  . Marital Status: Not on file  Intimate Partner Violence:   . Fear of Current or Ex-Partner: Not on file  . Emotionally Abused: Not on file  . Physically Abused: Not on file  . Sexually Abused: Not on file    Allergies  Allergen Reactions  . Amantadine Hcl Itching    REACTION: itching  . Chlorpromazine Hcl     REACTION: "fell flat on face"    Current Outpatient Medications  Medication Sig Dispense Refill  . acetaminophen (TYLENOL) 325 MG tablet Take 650 mg by mouth every 6 (six) hours as needed for mild pain.    Marland Kitchen allopurinol (ZYLOPRIM) 300 MG tablet Take 1 tablet by mouth once daily 90 tablet 0  . benzonatate (TESSALON) 200 MG capsule Take 1 capsule (200 mg total) by mouth 3 (three) times daily as needed for cough. (Patient taking differently: Take 200 mg by mouth 2 (two) times daily as needed for cough. ) 30 capsule 2  . carvedilol (COREG) 12.5 MG tablet TAKE 1/2 (ONE-HALF) TABLET BY MOUTH IN THE MORNING AND 1 IN THE EVENING 135 tablet 1  . clopidogrel (PLAVIX) 75 MG tablet Take 1 tablet by mouth once daily 90 tablet 0  .  EUTHYROX 25 MCG tablet Take 1 tablet by mouth once daily 90 tablet 0  . hydrOXYzine (ATARAX/VISTARIL) 10 MG tablet Take 10 mg by mouth 2 (two) times daily as needed for itching or anxiety.     . Melatonin 2.5 MG CAPS Take 2 capsules by mouth at bedtime as needed (sleep).     . mycophenolate (MYFORTIC) 180 MG EC tablet Take 360 mg by mouth 2 (two) times daily.     . pantoprazole (PROTONIX) 40 MG tablet Take 1 tablet (40 mg total) by mouth 2 (two) times daily. 180 tablet 0  . risperiDONE (RISPERDAL) 1 MG tablet Take 1 mg by mouth at bedtime.     . simvastatin (ZOCOR) 20 MG tablet Take 1 tablet by mouth once daily 90 tablet 1  . sulfamethoxazole-trimethoprim (BACTRIM,SEPTRA) 400-80 MG tablet Take 1 tablet by mouth every Monday, Wednesday, and Friday.    . tacrolimus (PROGRAF) 0.5 MG capsule See admin instructions. 2 in the am and 1 at night      No current facility-administered medications for this visit.    REVIEW OF SYSTEMS:  [X]  denotes positive finding, [ ]  denotes negative finding Cardiac  Comments:  Chest pain or chest pressure:    Shortness of breath upon exertion:    Short of breath when lying flat:    Irregular heart rhythm:        Vascular    Pain in calf, thigh, or hip brought on by ambulation:    Pain in feet at night that wakes you up from your sleep:     Blood clot in your veins:    Leg swelling:         Pulmonary    Oxygen at home:    Productive cough:     Wheezing:         Neurologic    Sudden weakness in arms or legs:     Sudden numbness in arms or legs:     Sudden onset of difficulty speaking  or slurred speech:    Temporary loss of vision in one eye:     Problems with dizziness:         Gastrointestinal    Blood in stool:     Vomited blood:         Genitourinary    Burning when urinating:     Blood in urine:        Psychiatric    Major depression:         Hematologic    Bleeding problems:    Problems with blood clotting too easily:        Skin      Rashes or ulcers:        Constitutional    Fever or chills:     PHYSICAL EXAM:   Vitals:   10/09/20 1535  BP: 124/69  Pulse: 73  Resp: 20  Temp: 98.4 F (36.9 C)  SpO2: 93%  Weight: 180 lb (81.6 kg)  Height: 5\' 9"  (1.753 m)    GENERAL: The patient is a well-nourished male, in no acute distress. The vital signs are documented above. CARDIAC: There is a regular rate and rhythm.  VASCULAR: His fistula in the left upper arm is somewhat pulsatile.  He has a palpable radial pulse.  His Doppler signal in the wrist augments with compression of his fistula.  The fistula is markedly aneurysmal as documented in the photograph below.    PULMONARY: There is good air exchange bilaterally without wheezing or rales. ABDOMEN: Soft and non-tender with normal pitched bowel sounds.  MUSCULOSKELETAL: There are no major deformities or cyanosis. NEUROLOGIC: No focal weakness or paresthesias are detected. SKIN: There are no ulcers or rashes noted. PSYCHIATRIC: The patient has a normal affect.  DATA:    No new data

## 2020-10-10 ENCOUNTER — Encounter: Payer: Self-pay | Admitting: Dermatology

## 2020-10-10 ENCOUNTER — Ambulatory Visit (INDEPENDENT_AMBULATORY_CARE_PROVIDER_SITE_OTHER): Payer: Medicare HMO | Admitting: Dermatology

## 2020-10-10 DIAGNOSIS — D044 Carcinoma in situ of skin of scalp and neck: Secondary | ICD-10-CM | POA: Diagnosis not present

## 2020-10-10 DIAGNOSIS — D049 Carcinoma in situ of skin, unspecified: Secondary | ICD-10-CM

## 2020-10-10 NOTE — Patient Instructions (Signed)

## 2020-10-11 ENCOUNTER — Other Ambulatory Visit: Payer: Self-pay

## 2020-10-11 ENCOUNTER — Encounter: Payer: Self-pay | Admitting: Physical Therapy

## 2020-10-11 ENCOUNTER — Ambulatory Visit: Payer: Medicare HMO | Admitting: Physical Therapy

## 2020-10-11 DIAGNOSIS — R2689 Other abnormalities of gait and mobility: Secondary | ICD-10-CM | POA: Diagnosis not present

## 2020-10-11 DIAGNOSIS — M6281 Muscle weakness (generalized): Secondary | ICD-10-CM

## 2020-10-11 NOTE — Therapy (Signed)
[facility] [address] [place], [place], [address] Phone: [phone]   Fax:  [phone]  Physical Therapy Treatment  Patient Details  Name: Curtis Jimenez MRN: [id number] Date of Birth: [birth year] Referring Provider (PT): [place]   Encounter Date: 10/11/2020   PT End of Session - 10/11/20 1011    Visit Number 14    Number of Visits 20    Date for PT Re-Evaluation 10/30/20    Authorization Type [employer]    PT Start Time 0930    PT Stop Time 1010    PT Time Calculation (min) 40 min    Activity Tolerance Patient tolerated treatment well    Behavior During Therapy [facility] for tasks assessed/performed           Past Medical History:  Diagnosis Date  . Basal cell carcinoma    neck (skin MD 2X per year)  . Bifascicular block 2021   ectopy, asymptomatic.  Cards->observe  . Bipolar disorder ([place])   . BPH (benign prostatic hyperplasia)    with elevated PSA; followed by Dr. Rosana Hoes at [facility]  . Chronic renal insufficiency, stage III (moderate) (HCC)    in pt w/hx of renal transplant.  Post-transplant sCr 1.4-1.6.  Last renal f/u 05/01/19->Cr 1.63, GFR 39 ml/min  . Coronary atherosclerosis    LM and 3 V dz noted on CT 08/2019 performed for eval of interstitial lung dz in the setting of mild DOE and mild hypoxia. Cards->no stress tesing indicated->primary prevention emphasized  . CVA (cerebral vascular accident) ([place]) 11/2016   Pontine (vertebrobasilar--imaging neg), TPA given.  Pt discharged on plavix.  Carotid dopplers ok, echocardiogram ok.  Residual deficit: vertigo but this improved greatly with therapy.  . DOE (dyspnea on exertion) 2020   DOE and ? hypoxia->CXR 08/29/19--->diffuse interstitial opacities-? acute inflammatory process->Dr. Melvyn Novas eval'd him and was underwhelmed by the CXR and exam->CT chest 09/26/19 "Very subtle areas of mild ground-glass attenuatio-nonspecific", mild air trapping. Per Dr. Melvyn Novas, no  signif ILD, improving with inc ambulation (post-inflamm pulm fibrosis?)  . Esophagitis 06/14/2020   EGD->+distal esophagitis, h pylori neg, mild distal esoph stenosis widened with forceps, benign gastric polyps.  . Gallbladder polyp 2015   Asymptomatic  . GERD (gastroesophageal reflux disease)   . Gout   . History of adenomatous polyp of colon 2018   Recall 5 yrs  . Hyperlipidemia   . Hypertension   . Hypothyroidism   . Lumbar spondylosis   . Ptosis due to aging    Right  . Rectal bleeding 09/2017   Admitted for obs due to Hb drop.  GI consulted---obs recommended.  No transfusion required.  Plavix was d/c'd, ferrous sulfate recommended.  Outpt GI f/u--plavix restarted and upper endoscopy was normal and colonoscopy showed 2 polyps and severe diverticular dz. Iron d/c'd 04/24/19.  Marland Kitchen Restless legs syndrome   . S/p cadaver renal transplant 2013   Secondary to HTN +lithium toxicity over 30 yrs caused kidney destruction [facility] transplant MDs)  . Seborrheic dermatitis    eyebrows worst: Hytone rx'd by Dr. Denna Haggard.  . 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 AV FISTULA PLACEMENT  04/08/2012   Procedure: ARTERIOVENOUS (AV) FISTULA CREATION;  Surgeon: Angelia Mould, MD;  [facility];  Service: Vascular;  Laterality: Left;  Creation of left brachial cephalic arteriovenous fistula  . BIOPSY  06/14/2020   Procedure: BIOPSY;  Surgeon: Lavena Bullion, DO;  Location: [facility];  Service: Gastroenterology;;  . CATARACT EXTRACTION, BILATERAL  05/2018  . COLONOSCOPY  2013; 11/24/17   2013; Normal except diverticulosis (recall 10 yrs).  2018 (for GI bleeding)--severe diverticular dz + 2 adenomatous polyps.  Recall 5 yrs.  . DG CHEST AP OR PA ([facility] HX)   06/03/2020   Bibasilar atelectasis. No focal consoliadation definitively identified.  . dilation for GERD    . ESOPHAGOGASTRODUODENOSCOPY  11/24/2017   gastric polyps x 2, otherwise normal.  . ESOPHAGOGASTRODUODENOSCOPY N/A 06/14/2020   +distal esophagitis, h pylori neg, mild distal esoph stenosis widened with forceps, benign gastric polypsProcedure: ESOPHAGOGASTRODUODENOSCOPY (EGD);  Surgeon: Lavena Bullion, DO;  Location: [facility];  Service: Gastroenterology;  Laterality: N/A;  Diagnostic EGD as patietn last took Plavix 11am on 06/12/2020  . KIDNEY TRANSPLANT  11/06/12   [place] (cadaveric)  . PROSTATE BIOPSY  2011   no malignancy  . TRANSTHORACIC ECHOCARDIOGRAM  11/2016   Normal LV systolic fxn, EF 03-21%.  Grade I DD.  Mild aortic root dilatation, mild MV regurg.    There were no vitals filed for this visit.   Subjective Assessment - 10/11/20 0941    Subjective Back is just a little sore.  I'm continuing to improve with eyes closed challenges.    Pertinent History 2013 kidney transplant, June: espphagus hospitalization and expansion, 2017 CVA,    Limitations Lifting;Standing;Walking;House hold activities    Patient Stated Goals Improved balance, less stumbling / close falls    Currently in Pain? Yes    Pain Score 1     Pain Location Back    Pain Orientation Right;Lower    Pain Descriptors / Indicators Aching    Pain Type Chronic pain    Aggravating Factors  AM    Pain Relieving Factors cane                             [facility] Adult PT Treatment/Exercise - 10/11/20 0001      Exercises   Exercises Lumbar;Knee/Hip      Lumbar Exercises: Aerobic   Nustep L2 x 8', PT present to give lumbar heat and discuss progress      Knee/Hip Exercises: Standing   Hip Flexion AROM;Both;Knee bent;20 reps    Hip Flexion Limitations march, no hands, PT cued slow down to show more control in SLS phase of march    Other Standing Knee Exercises toe taps lateral, forward  and backward no hands 2x10 sets each, good weight shifts side to side noted      Knee/Hip Exercises: Seated   Long Arc Quad Strengthening;Both;2 sets;10 reps;Weights    Long Arc Quad Weight 3 lbs.    Marching Strengthening;Weights;2 sets;20 reps    Marching Limitations holding 6lb on bil thighs    Abd/Adduction Limitations yellow loop 20x    Sit to Sand 2 sets;5 reps;without UE support   yellow loop for hip abd cue              Balance Exercises - 10/11/20 0001      Balance Exercises: Standing   Standing Eyes Opened Narrow base of support (BOS);Head turns;Foam/compliant surface;3 reps;20 secs    Standing Eyes Closed Foam/compliant surface;Narrow base of support (BOS);20 secs    Standing, One Foot on a Step Foam/compliant  surface;Eyes open;4 inch;2 reps;15 secs    Other Standing Exercises yellow band pullbacks and forwards x 10 each in narrow BOS    Other Standing Exercises Comments floor ladder high knee stepping heel toe cues, fwd/bwd stepping, lateral stepping in/out of single square, close supervision/mild CGA used by PT               PT Short Term Goals - 08/27/20 1536      PT SHORT TERM GOAL #1   Title Pt to be independent with intial HEP    Status Achieved      PT SHORT TERM GOAL #2   Title Pt to demo ability for stand/wait after sit to stand, to unsure no dizziness    Baseline every now and then light headed due to low BP    Status Achieved      PT SHORT TERM GOAL #3   Title -      PT SHORT TERM GOAL #4   Title -      PT SHORT TERM GOAL #5   Title -             PT Long Term Goals - 09/09/20 1133      PT LONG TERM GOAL #1   Title Pt to be independent with final HEP    Baseline progressing balance challenges    Period Weeks    Status On-going      PT LONG TERM GOAL #2   Baseline 46/56 08/27/20, previously 42/56    Time 6    Period Weeks    Status On-going      PT LONG TERM GOAL #3   Title Pt to demo improved score on DGI by at least 5  points    Baseline improved from 15 to 16 points    Time 6    Period Weeks    Status On-going      PT LONG TERM GOAL #4   Title Pt to independenty/safely navigate at least 5 steps with 1 hand rail.    Time 6    Period Weeks    Status Achieved                 Plan - 10/11/20 1012    Clinical Impression Statement Pt with very low level LBP 1/10 on arrival today.  He has progressed to no UE for standing balance challenges and even performed some foam pad standing narrow BOS, head turns and eyes closed without UE support today.  He is showing much improved weight shifts for standing multi-direction toe taps and slow marching, demo'ing imrpoved SLS stability for functional gait and turning.  Floor ladder introduced today for stepping strategies which challenged him to take bigger steps.  CGA used with this.  Continue along POC with ongoing assessment.    Comorbidities 2013 kidney transplant, June: espphagus hospitalization and expansion, 2017 CVA,    Rehab Potential Good    PT Frequency 2x / week    PT Duration 8 weeks    PT Treatment/Interventions ADLs/Self Care Home Management;Canalith Repostioning;Cryotherapy;DME Instruction;Ultrasound;Traction;Moist Heat;Iontophoresis 4mg /ml Dexamethasone;Gait training;Stair training;Functional mobility training;Therapeutic activities;Therapeutic exercise;Balance training;Neuromuscular re-education;Manual techniques;Patient/family education;Passive range of motion;Energy conservation;Joint Manipulations;Spinal Manipulations;Vestibular;Taping;Vasopneumatic Device    PT Next Visit Plan cone weaving, re-visit floor ladder, continue compliant surface challenges and standing LE toe taps, slow march for SLS control    PT Home Exercise Plan [id number]    Consulted and Agree with Plan of Care Patient  Patient will benefit from skilled therapeutic intervention in order to improve the following deficits and impairments:     Visit Diagnosis: Other  abnormalities of gait and mobility  Muscle weakness (generalized)     Problem List Patient Active Problem List   Diagnosis Date Noted  . Peptic stricture of esophagus   . Emesis 06/13/2020  . Weight loss 06/13/2020  . Cough 06/13/2020  . AKI (acute kidney injury) ([place]) 06/13/2020  . Metabolic acidosis 37/62/8315  . Esophageal dysphagia   . 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 DOE (dyspnea on exertion) 08/29/2019  . Overweight (BMI 25.0-29.9) 07/27/2019  . GIB (gastrointestinal bleeding) 10/07/2017  . IFG (impaired fasting glucose) 06/08/2017  . Stroke ([place]) 11/30/2016  . Essential hypertension 01/16/2015  . S/p cadaver renal transplant 11/06/2012  . Chronic kidney disease (CKD), stage IV (severe) ([place]) 03/18/2012  . BENIGN PROSTATIC HYPERTROPHY 04/18/2008  . EXTRINSIC ASTHMA, UNSPECIFIED 12/19/2007  . PSA, INCREASED 12/09/2007  . Hypothyroidism 08/12/2007  . Bipolar disorder ([place]) 07/27/2007  . Hyperlipidemia 07/07/2007  . Gout 07/07/2007  . HYPERTENSION 07/07/2007  . GERD 07/07/2007    Baruch Merl, PT 10/11/20 10:15 AM   [facility] [address], [place], [place], [address] Phone: [phone]   Fax:  [phone]  Name: DONTERRIUS SANTUCCI MRN: [id number] Date of Birth: [birth year]

## 2020-10-14 ENCOUNTER — Ambulatory Visit: Payer: Medicare HMO | Admitting: Physical Therapy

## 2020-10-14 ENCOUNTER — Other Ambulatory Visit: Payer: Self-pay

## 2020-10-14 ENCOUNTER — Encounter: Payer: Self-pay | Admitting: Physical Therapy

## 2020-10-14 DIAGNOSIS — R2689 Other abnormalities of gait and mobility: Secondary | ICD-10-CM

## 2020-10-14 DIAGNOSIS — M6281 Muscle weakness (generalized): Secondary | ICD-10-CM

## 2020-10-14 NOTE — Therapy (Signed)
[facility] [address] [place], [place], [address] Phone: [phone]   Fax:  [phone]  Physical Therapy Treatment  Patient Details  Name: Curtis Jimenez MRN: [id number] Date of Birth: 02-15-1936 Referring Provider (PT): McGowen   Encounter Date: 10/14/2020   PT End of Session - 10/14/20 1055    Visit Number 15    Number of Visits 20    Date for PT Re-Evaluation 10/30/20    Authorization Type [employer]et[employer]    PT Start Time 1053    PT Stop Time 1112    PT Time Calculation (min) 19 min    Activity Tolerance Patient limited by pain    Behavior During Therapy [facility] for tasks assessed/performed           Past Medical History:  Diagnosis Date  . Basal cell carcinoma    neck (skin MD 2X per year)  . Bifascicular block 2021   ectopy, asymptomatic.  Cards->observe  . Bipolar disorder ([place])   . BPH (benign prostatic hyperplasia)    with elevated PSA; followed by Dr. Rosana Hoes at [facility]  . Chronic renal insufficiency, stage III (moderate) (HCC)    in pt w/hx of renal transplant.  Post-transplant sCr 1.4-1.6.  Last renal f/u 05/01/19->Cr 1.63, GFR 39 ml/min  . Coronary atherosclerosis    LM and 3 V dz noted on CT 08/2019 performed for eval of interstitial lung dz in the setting of mild DOE and mild hypoxia. Cards->no stress tesing indicated->primary prevention emphasized  . CVA (cerebral vascular accident) ([place]) 11/2016   Pontine (vertebrobasilar--imaging neg), TPA given.  Pt discharged on plavix.  Carotid dopplers ok, echocardiogram ok.  Residual deficit: vertigo but this improved greatly with therapy.  . DOE (dyspnea on exertion) 2020   DOE and ? hypoxia->CXR 08/29/19--->diffuse interstitial opacities-? acute inflammatory process->Dr. Melvyn Novas eval'd him and was underwhelmed by the CXR and exam->CT chest 09/26/19 "Very subtle areas of mild ground-glass attenuatio-nonspecific", mild air trapping. Per Dr. Melvyn Novas, no signif ILD,  improving with inc ambulation (post-inflamm pulm fibrosis?)  . Esophagitis 06/14/2020   EGD->+distal esophagitis, h pylori neg, mild distal esoph stenosis widened with forceps, benign gastric polyps.  . Gallbladder polyp 2015   Asymptomatic  . GERD (gastroesophageal reflux disease)   . Gout   . History of adenomatous polyp of colon 2018   Recall 5 yrs  . Hyperlipidemia   . Hypertension   . Hypothyroidism   . Lumbar spondylosis   . Ptosis due to aging    Right  . Rectal bleeding 09/2017   Admitted for obs due to Hb drop.  GI consulted---obs recommended.  No transfusion required.  Plavix was d/c'd, ferrous sulfate recommended.  Outpt GI f/u--plavix restarted and upper endoscopy was normal and colonoscopy showed 2 polyps and severe diverticular dz. Iron d/c'd 04/24/19.  Marland Kitchen Restless legs syndrome   . S/p cadaver renal transplant 2013   Secondary to HTN +lithium toxicity over 30 yrs caused kidney destruction [facility] transplant MDs)  . Seborrheic dermatitis    eyebrows worst: Hytone rx'd by Dr. Denna Haggard.  . 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 AV FISTULA PLACEMENT  04/08/2012   Procedure: ARTERIOVENOUS (AV) FISTULA CREATION;  Surgeon: Angelia Mould, MD;  Location: [facility];  Service: Vascular;  Laterality: Left;  Creation of left brachial cephalic arteriovenous fistula  . BIOPSY  06/14/2020   Procedure: BIOPSY;  Surgeon: Lavena Bullion, DO;  Location: [facility];  Service: Gastroenterology;;  . CATARACT EXTRACTION, BILATERAL  05/2018  . COLONOSCOPY  2013; 11/24/17   2013; Normal except diverticulosis (recall 10 yrs).  2018 (for GI bleeding)--severe diverticular dz + 2 adenomatous polyps.  Recall 5 yrs.  . DG CHEST AP OR PA ([facility] HX)  06/03/2020    Bibasilar atelectasis. No focal consoliadation definitively identified.  . dilation for GERD    . ESOPHAGOGASTRODUODENOSCOPY  11/24/2017   gastric polyps x 2, otherwise normal.  . ESOPHAGOGASTRODUODENOSCOPY N/A 06/14/2020   +distal esophagitis, h pylori neg, mild distal esoph stenosis widened with forceps, benign gastric polypsProcedure: ESOPHAGOGASTRODUODENOSCOPY (EGD);  Surgeon: Lavena Bullion, DO;  Location: [facility];  Service: Gastroenterology;  Laterality: N/A;  Diagnostic EGD as patietn last took Plavix 11am on 06/12/2020  . KIDNEY TRANSPLANT  11/06/12   [place] (cadaveric)  . PROSTATE BIOPSY  2011   no malignancy  . TRANSTHORACIC ECHOCARDIOGRAM  11/2016   Normal LV systolic fxn, EF 51-76%.  Grade I DD.  Mild aortic root dilatation, mild MV regurg.    There were no vitals filed for this visit.   Subjective Assessment - 10/14/20 1055    Subjective my back is not good today, I do not feel that great. Pt requests to reduce volume of session today.    Pertinent History 2013 kidney transplant, June: espphagus hospitalization and expansion, 2017 CVA,    Currently in Pain? Yes    Pain Score 7     Pain Location Back    Pain Orientation Lower    Pain Descriptors / Indicators Tender;Sore    Multiple Pain Sites No                             [facility] Adult PT Treatment/Exercise - 10/14/20 0001      Lumbar Exercises: Aerobic   Nustep L1 x 6 min: pt request      Knee/Hip Exercises: Standing   Heel Raises Both;1 set;10 reps    Rebounder 30 sec wt shifting 3 ways    Other Standing Knee Exercises alt toe taps 10x light UE       Knee/Hip Exercises: Seated   Long Arc Quad AROM;Strengthening;Both;2 sets;10 reps    Long Arc Quad Weight 0 lbs.   pt requested no wts d/t back pain   Marching AROM;Left;20 reps    Marching Limitations declined weight    Abd/Adduction Limitations yellow loop 20x    Sit to Sand 5 reps;without UE support                    PT  Short Term Goals - 08/27/20 1536      PT SHORT TERM GOAL #1   Title Pt to be independent with intial HEP    Status Achieved      PT SHORT TERM GOAL #2   Title Pt to demo ability for stand/wait after sit to stand, to unsure no dizziness    Baseline every now and then light headed due to low BP    Status Achieved      PT SHORT TERM GOAL #3   Title -      PT SHORT TERM GOAL #4   Title -      PT SHORT TERM GOAL #5  Title -             PT Long Term Goals - 09/09/20 1133      PT LONG TERM GOAL #1   Title Pt to be independent with final HEP    Baseline progressing balance challenges    Period Weeks    Status On-going      PT LONG TERM GOAL #2   Baseline 46/56 08/27/20, previously 42/56    Time 6    Period Weeks    Status On-going      PT LONG TERM GOAL #3   Title Pt to demo improved score on DGI by at least 5 points    Baseline improved from 15 to 16 points    Time 6    Period Weeks    Status On-going      PT LONG TERM GOAL #4   Title Pt to independenty/safely navigate at least 5 steps with 1 hand rail.    Time 6    Period Weeks    Status Achieved                 Plan - 10/14/20 1113    Clinical Impression Statement Pt arrives with increased back pain complaints and the request to reduce todays volume. Pt tolerated barely 20 min of exercise but was happy he could do that. Back began to increase in pain slightly towards the 20 min mark so sesison was ended.    Personal Factors and Comorbidities Fitness;Comorbidity 1;Comorbidity 2    Comorbidities 2013 kidney transplant, June: espphagus hospitalization and expansion, 2017 CVA,    Examination-Activity Limitations Locomotion Level;Squat;Stairs;Stand    Examination-Participation Restrictions Volunteer    Stability/Clinical Decision Making Evolving/Moderate complexity    Rehab Potential Good    PT Frequency 2x / week    PT Duration 8 weeks    PT Treatment/Interventions ADLs/Self Care Home Management;Canalith  Repostioning;Cryotherapy;DME Instruction;Ultrasound;Traction;Moist Heat;Iontophoresis 4mg /ml Dexamethasone;Gait training;Stair training;Functional mobility training;Therapeutic activities;Therapeutic exercise;Balance training;Neuromuscular re-education;Manual techniques;Patient/family education;Passive range of motion;Energy conservation;Joint Manipulations;Spinal Manipulations;Vestibular;Taping;Vasopneumatic Device    PT Next Visit Plan See how back is and proceed per pt report    PT Home Exercise Plan [id number]    Consulted and Agree with Plan of Care Patient           Patient will benefit from skilled therapeutic intervention in order to improve the following deficits and impairments:  Abnormal gait, Decreased coordination, Difficulty walking, Decreased safety awareness, Decreased endurance, Decreased activity tolerance, Pain, Impaired flexibility, Decreased balance, Decreased knowledge of use of DME, Decreased mobility, Decreased strength  Visit Diagnosis: Other abnormalities of gait and mobility  Muscle weakness (generalized)     Problem List Patient Active Problem List   Diagnosis Date Noted  . Peptic stricture of esophagus   . Emesis 06/13/2020  . Weight loss 06/13/2020  . Cough 06/13/2020  . AKI (acute kidney injury) ([place]) 06/13/2020  . Metabolic acidosis 82/50/5397  . Esophageal dysphagia   . Postinflammatory pulmonary fibrosis ([place]) 08/30/2019  . DOE (dyspnea on exertion) 08/29/2019  . Overweight (BMI 25.0-29.9) 07/27/2019  . GIB (gastrointestinal bleeding) 10/07/2017  . IFG (impaired fasting glucose) 06/08/2017  . Stroke ([place]) 11/30/2016  . Essential hypertension 01/16/2015  . S/p cadaver renal transplant 11/06/2012  . Chronic kidney disease (CKD), stage IV (severe) ([place]) 03/18/2012  . BENIGN PROSTATIC HYPERTROPHY 04/18/2008  . EXTRINSIC ASTHMA, UNSPECIFIED 12/19/2007  . PSA, INCREASED 12/09/2007  . Hypothyroidism 08/12/2007  . Bipolar disorder ([place]) 07/27/2007  .  Hyperlipidemia 07/07/2007  .  Gout 07/07/2007  . HYPERTENSION 07/07/2007  . GERD 07/07/2007    Josemanuel Eakins, PTA 10/14/2020, 11:16 AM  [facility] [address] [place], [place], [address] Phone: [phone]   Fax:  [phone]  Name: Curtis Jimenez MRN: [id number] Date of Birth: [birth year]

## 2020-10-15 ENCOUNTER — Other Ambulatory Visit: Payer: Self-pay

## 2020-10-15 ENCOUNTER — Encounter: Payer: Self-pay | Admitting: Psychiatry

## 2020-10-15 ENCOUNTER — Ambulatory Visit (INDEPENDENT_AMBULATORY_CARE_PROVIDER_SITE_OTHER): Payer: Medicare HMO | Admitting: Psychiatry

## 2020-10-15 DIAGNOSIS — F5105 Insomnia due to other mental disorder: Secondary | ICD-10-CM | POA: Diagnosis not present

## 2020-10-15 DIAGNOSIS — F319 Bipolar disorder, unspecified: Secondary | ICD-10-CM | POA: Diagnosis not present

## 2020-10-15 DIAGNOSIS — R69 Illness, unspecified: Secondary | ICD-10-CM | POA: Diagnosis not present

## 2020-10-15 NOTE — Progress Notes (Signed)
Curtis Jimenez [id number] [birth year] 84 y.o.  Subjective:   Patient ID:  Curtis Jimenez is a 84 y.o. (DOB 25-Jul-1936) male.  Chief Complaint:  Chief Complaint  Patient presents with  . Follow-up    mood and meds    HPI Curtis Jimenez presents to the office today for follow-up of bipolar and sleep.  seen September 2019.  Continued on risperidone 1 mg HS which was reduced from 2 mg previously.  October 2020 appointment with the following noted: 2-3 times weekly initial insomnia.  Other days sleep plenty of hours and naps 2 hours.  2 days ago skipped naps and still couldn't go to sleep.  Asks about melatonin. Plan: No med changes today except for the addition of melatonin.  10/15/2020 appointment with the following noted: Has split leaf philodendron from 40 years ago. Doing excellent except for bad working dreams that are detailed with racing thoughts and anxiety in the dream.  Awakens for 30 min. A couple times per week. Very stable since out of a stressful job and marriage.  Patient reports stable mood and denies depressed or irritable moods.  Patient denies any recent difficulty with anxiety.  Patient denies difficulty with sleep initiation but 12 hours  Feels rested after sleep.. Denies appetite disturbance.  Patient reports that energy and motivation have been good.  Patient denies any difficulty with concentration.  Patient denies any suicidal ideation.  Past Psychiatric Medication Trials: Lithium, VPA, olanzapine, lamotrigine, clonazepam, risperidone, Geodon  Review of Systems:  Review of Systems  Respiratory: Positive for shortness of breath.   Gastrointestinal: Negative for vomiting.  Neurological: Negative for tremors and weakness.    Medications: I have reviewed the patient's current medications.  Current Outpatient Medications  Medication Sig Dispense Refill  . acetaminophen (TYLENOL) 325 MG tablet Take 650 mg by mouth every 6 (six) hours as needed for  mild pain.    Marland Kitchen allopurinol (ZYLOPRIM) 300 MG tablet Take 1 tablet by mouth once daily 90 tablet 0  . benzonatate (TESSALON) 200 MG capsule Take 1 capsule (200 mg total) by mouth 3 (three) times daily as needed for cough. (Patient taking differently: Take 200 mg by mouth 2 (two) times daily as needed for cough. ) 30 capsule 2  . carvedilol (COREG) 12.5 MG tablet TAKE 1/2 (ONE-HALF) TABLET BY MOUTH IN THE MORNING AND 1 IN THE EVENING 135 tablet 1  . clopidogrel (PLAVIX) 75 MG tablet Take 1 tablet by mouth once daily 90 tablet 0  . EUTHYROX 25 MCG tablet Take 1 tablet by mouth once daily 90 tablet 0  . hydrOXYzine (ATARAX/VISTARIL) 10 MG tablet Take 10 mg by mouth 2 (two) times daily as needed for itching or anxiety.     . Melatonin 2.5 MG CAPS Take 2 capsules by mouth at bedtime as needed (sleep).     . mycophenolate (MYFORTIC) 180 MG EC tablet Take 360 mg by mouth 2 (two) times daily.     . pantoprazole (PROTONIX) 40 MG tablet Take 1 tablet (40 mg total) by mouth 2 (two) times daily. 180 tablet 0  . risperiDONE (RISPERDAL) 1 MG tablet Take 1 mg by mouth at bedtime.     . simvastatin (ZOCOR) 20 MG tablet Take 1 tablet by mouth once daily 90 tablet 1  . sulfamethoxazole-trimethoprim (BACTRIM,SEPTRA) 400-80 MG tablet Take 1 tablet by mouth every Monday, Wednesday, and Friday.    . tacrolimus (PROGRAF) 0.5 MG capsule See admin instructions. 2 in the am and 1 at  night      No current facility-administered medications for this visit.    Medication Side Effects: None  Allergies:  Allergies  Allergen Reactions  . Amantadine Hcl Itching    REACTION: itching  . Chlorpromazine Hcl     REACTION: "fell flat on face"    Past Medical History:  Diagnosis Date  . Basal cell carcinoma    neck (skin MD 2X per year)  . Bifascicular block 2021   ectopy, asymptomatic.  Cards->observe  . Bipolar disorder ([place])   . BPH (benign prostatic hyperplasia)    with elevated PSA; followed by Dr. Rosana Hoes at  [facility]  . Chronic renal insufficiency, stage III (moderate) (HCC)    in pt w/hx of renal transplant.  Post-transplant sCr 1.4-1.6.  Last renal f/u 05/01/19->Cr 1.63, GFR 39 ml/min  . Coronary atherosclerosis    LM and 3 V dz noted on CT 08/2019 performed for eval of interstitial lung dz in the setting of mild DOE and mild hypoxia. Cards->no stress tesing indicated->primary prevention emphasized  . CVA (cerebral vascular accident) ([place]) 11/2016   Pontine (vertebrobasilar--imaging neg), TPA given.  Pt discharged on plavix.  Carotid dopplers ok, echocardiogram ok.  Residual deficit: vertigo but this improved greatly with therapy.  . DOE (dyspnea on exertion) 2020   DOE and ? hypoxia->CXR 08/29/19--->diffuse interstitial opacities-? acute inflammatory process->Dr. Melvyn Novas eval'd him and was underwhelmed by the CXR and exam->CT chest 09/26/19 "Very subtle areas of mild ground-glass attenuatio-nonspecific", mild air trapping. Per Dr. Melvyn Novas, no signif ILD, improving with inc ambulation (post-inflamm pulm fibrosis?)  . Esophagitis 06/14/2020   EGD->+distal esophagitis, h pylori neg, mild distal esoph stenosis widened with forceps, benign gastric polyps.  . Gallbladder polyp 2015   Asymptomatic  . GERD (gastroesophageal reflux disease)   . Gout   . History of adenomatous polyp of colon 2018   Recall 5 yrs  . Hyperlipidemia   . Hypertension   . Hypothyroidism   . Lumbar spondylosis   . Ptosis due to aging    Right  . Rectal bleeding 09/2017   Admitted for obs due to Hb drop.  GI consulted---obs recommended.  No transfusion required.  Plavix was d/c'd, ferrous sulfate recommended.  Outpt GI f/u--plavix restarted and upper endoscopy was normal and colonoscopy showed 2 polyps and severe diverticular dz. Iron d/c'd 04/24/19.  Marland Kitchen Restless legs syndrome   . S/p cadaver renal transplant 2013   Secondary to HTN +lithium toxicity over 30 yrs caused kidney destruction [facility] transplant MDs)  . Seborrheic  dermatitis    eyebrows worst: Hytone rx'd by Dr. Denna Haggard.  . 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 Ulcers Mother        GI bleed   . Heart disease Father   . Bladder Cancer Father   . Colon cancer Neg Hx   . Esophageal cancer Neg Hx   . Pancreatic cancer Neg Hx   . Rectal cancer Neg Hx   . Stomach cancer Neg Hx     Social History   Socioeconomic History  . Marital status: Married    Spouse name: Not on file  . Number of children: Not on file  . Years of education: Not on file  . Highest education level: Not on file  Occupational History  . Not on file  Tobacco Use  . Smoking status: Former Smoker    Years: 16.00    Types: Pipe, Cigars    Quit date: 05/11/1974    Years since quitting: 46.4  . Smokeless tobacco: Never Used  Vaping Use  . Vaping Use: Never used  Substance and Sexual Activity  . Alcohol use: No  . Drug use: No  . Sexual activity: Not Currently    Partners: Female  Other Topics Concern  . Not on file  Social History Narrative   Married, 4 children, 9 GGC, [place].   Occupation: retired Marine scientist.  Originally from [place] [employer].   Tobacco: quit 1975; smoked pipes and cigars x 15 yrs prior to this.   Alcohol: none.   Exercise: minimal, but is going to sign up for silver sneakers at the [facility].   Social Determinants of Health   Financial Resource Strain:   . Difficulty of Paying Living Expenses: Not on file  Food Insecurity:   . Worried About Charity fundraiser in the Last Year: Not on file  . Ran Out of Food in the Last Year: Not on file  Transportation Needs: No Transportation Needs  . Lack of Transportation (Medical): No  . Lack of Transportation (Non-Medical): No  Physical Activity:   . Days of Exercise per Week: Not on  file  . Minutes of Exercise per Session: Not on file  Stress:   . Feeling of Stress : Not on file  Social Connections:   . Frequency of Communication with Friends and Family: Not on file  . Frequency of Social Gatherings with Friends and Family: Not on file  . Attends Religious Services: Not on file  . Active Member of Clubs or Organizations: Not on file  . Attends Archivist Meetings: Not on file  . Marital Status: Not on file  Intimate Partner Violence:   . Fear of Current or Ex-Partner: Not on file  . Emotionally Abused: Not on file  . Physically Abused: Not on file  . Sexually Abused: Not on file    Past Medical History, Surgical history, Social history, and Family history were reviewed and updated as appropriate.   Please see review of systems for further details on the patient's review from today.   Objective:   Physical Exam:  There were no vitals taken for this visit.  Physical Exam Constitutional:      General: He is not in acute distress.    Appearance: He is well-developed.  Musculoskeletal:        General: No deformity.  Neurological:     Mental Status: He is alert and oriented to person, place, and time.     Coordination: Coordination normal.  Psychiatric:        Attention and Perception: Attention and perception normal. He does not perceive auditory or visual hallucinations.        Mood and Affect: Mood normal. Mood is not anxious or depressed. Affect is not labile, blunt, angry or inappropriate.        Speech: Speech normal.        Behavior: Behavior normal.        Thought Content: Thought content normal. Thought content does not include homicidal or suicidal ideation. Thought content does not include homicidal or suicidal plan.        Cognition and Memory: Cognition and memory normal.        Judgment: Judgment normal.     Comments: Insight intact.  No delusions.  No mania. Cane     Lab Review:     Component Value Date/Time   NA 141  06/28/2020 1222   NA 139 08/16/2019 0000   K 4.3 06/28/2020 1222   CL 108 06/28/2020 1222   CO2 24 06/28/2020 1222   GLUCOSE 106 (H) 06/28/2020 1222   GLUCOSE 103 (H) 12/15/2006 1045   BUN 20 06/28/2020 1222   BUN 29 (A) 08/16/2019 0000   CREATININE 1.55 (H) 06/28/2020 1222   CALCIUM 9.2 06/28/2020 1222   CALCIUM 9.5 12/06/2010 0046   PROT 5.7 (L) 06/28/2020 1222   ALBUMIN 3.7 06/12/2020 2256   AST 9 (L) 06/28/2020 1222   ALT 9 06/28/2020 1222   ALKPHOS 49 06/12/2020 2256   BILITOT 0.7 06/28/2020 1222   GFRNONAA 42 (L) 06/14/2020 0326   GFRAA 48 (L) 06/14/2020 0326       Component Value Date/Time   WBC 4.8 06/28/2020 1222   RBC 4.86 06/28/2020 1222   HGB 13.7 06/28/2020 1222   HCT 43.4 06/28/2020 1222   PLT 198 06/28/2020 1222   MCV 89.3 06/28/2020 1222   MCH 28.2 06/28/2020 1222   MCHC 31.6 (L) 06/28/2020 1222   RDW 13.1 06/28/2020 1222   LYMPHSABS 518 (L) 06/28/2020 1222   MONOABS 0.8 06/12/2020 2256   EOSABS 82 06/28/2020 1222   BASOSABS 29 06/28/2020 1222    No results found for: POCLITH, LITHIUM   No results found for: PHENYTOIN, PHENOBARB, VALPROATE, CBMZ   .res Assessment: Plan:    Tannen was seen today for follow-up.  Diagnoses and all orders for this visit:  Bipolar I disorder ([place])  Insomnia due to mental condition    Patient has a history of kidney transplant.  He has been stable on risperidone for several years for bipolar disorder.  He has had previous manic episodes but none in several years.  We have reduced the risperidone to the lowest effective dose.  As noted he has been on alternatives.  He is satisfied with the current meds.  Discussed potential metabolic side effects associated with atypical antipsychotics, as well as potential risk for movement side effects. Advised pt to contact office if movement side effects occur.  Continue risperidone 1 mg HS  Option doxazosin for nM but don't DT BP concerns.  Disc causes of NM  No med  changes today except for the addition of melatonin.  Ok melatonin 5 mg 30 min before sleep  FU 1 year bc stable for years  Lynder Parents, MD, DFAPA  Please see After Visit Summary for patient specific instructions.  Future Appointments  Date Time Provider [place]  10/21/2020 10:15 AM Altamese Dilling, PTA [facility]  10/23/2020 11:45 AM Beuhring, Alene Mires, PT [facility]  10/28/2020 10:15 AM Altamese Dilling, PTA [facility]  10/30/2020 10:15 AM Beuhring, Alene Mires, PT [facility]  01/21/2021 10:00 AM McGowen, Adrian [place], MD [facility]  10/13/2021 10:30 AM Lavonna Monarch, MD [facility]    No orders of the defined types were placed in this encounter.   -------------------------------

## 2020-10-16 ENCOUNTER — Encounter: Payer: Medicare HMO | Admitting: Physical Therapy

## 2020-10-21 ENCOUNTER — Ambulatory Visit: Payer: Medicare HMO | Admitting: Physical Therapy

## 2020-10-21 ENCOUNTER — Encounter: Payer: Self-pay | Admitting: Physical Therapy

## 2020-10-21 ENCOUNTER — Other Ambulatory Visit: Payer: Self-pay

## 2020-10-21 DIAGNOSIS — R2689 Other abnormalities of gait and mobility: Secondary | ICD-10-CM

## 2020-10-21 DIAGNOSIS — M6281 Muscle weakness (generalized): Secondary | ICD-10-CM | POA: Diagnosis not present

## 2020-10-21 NOTE — Therapy (Signed)
[facility] [address], [place], [place], [address] Phone: [phone]   Fax:  [phone]  Physical Therapy Treatment  Patient Details  Name: Curtis Jimenez MRN: [id number] Date of Birth: 1936/07/19 Referring Provider (PT): McGowen   Encounter Date: 10/21/2020   PT End of Session - 10/21/20 1019    Visit Number 16    Number of Visits 20    Date for PT Re-Evaluation 10/30/20    Authorization Type Aet[employer]    PT Start Time 1017   Pt wishes to keep time frames shorter so he doesn't flare back   PT Stop Time 1046    PT Time Calculation (min) 29 min    Activity Tolerance Patient tolerated treatment well    Behavior During Therapy [facility] for tasks assessed/performed           Past Medical History:  Diagnosis Date  . Basal cell carcinoma    neck (skin MD 2X per year)  . Bifascicular block 2021   ectopy, asymptomatic.  Cards->observe  . Bipolar disorder ([place])   . BPH (benign prostatic hyperplasia)    with elevated PSA; followed by Dr. Rosana Hoes at [facility]  . Chronic renal insufficiency, stage III (moderate) (HCC)    in pt w/hx of renal transplant.  Post-transplant sCr 1.4-1.6.  Last renal f/u 05/01/19->Cr 1.63, GFR 39 ml/min  . Coronary atherosclerosis    LM and 3 V dz noted on CT 08/2019 performed for eval of interstitial lung dz in the setting of mild DOE and mild hypoxia. Cards->no stress tesing indicated->primary prevention emphasized  . CVA (cerebral vascular accident) ([place]) 11/2016   Pontine (vertebrobasilar--imaging neg), TPA given.  Pt discharged on plavix.  Carotid dopplers ok, echocardiogram ok.  Residual deficit: vertigo but this improved greatly with therapy.  . DOE (dyspnea on exertion) 2020   DOE and ? hypoxia->CXR 08/29/19--->diffuse interstitial opacities-? acute inflammatory process->Dr. Melvyn Novas eval'd him and was underwhelmed by the CXR and exam->CT chest 09/26/19 "Very subtle areas of mild ground-glass  attenuatio-nonspecific", mild air trapping. Per Dr. Melvyn Novas, no signif ILD, improving with inc ambulation (post-inflamm pulm fibrosis?)  . Esophagitis 06/14/2020   EGD->+distal esophagitis, h pylori neg, mild distal esoph stenosis widened with forceps, benign gastric polyps.  . Gallbladder polyp 2015   Asymptomatic  . GERD (gastroesophageal reflux disease)   . Gout   . History of adenomatous polyp of colon 2018   Recall 5 yrs  . Hyperlipidemia   . Hypertension   . Hypothyroidism   . Lumbar spondylosis   . Ptosis due to aging    Right  . Rectal bleeding 09/2017   Admitted for obs due to Hb drop.  GI consulted---obs recommended.  No transfusion required.  Plavix was d/c'd, ferrous sulfate recommended.  Outpt GI f/u--plavix restarted and upper endoscopy was normal and colonoscopy showed 2 polyps and severe diverticular dz. Iron d/c'd 04/24/19.  Marland Kitchen Restless legs syndrome   . S/p cadaver renal transplant 2013   Secondary to HTN +lithium toxicity over 30 yrs caused kidney destruction [facility] transplant MDs)  . Seborrheic dermatitis    eyebrows worst: Hytone rx'd by Dr. Denna Haggard.  . 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 AV FISTULA PLACEMENT  04/08/2012   Procedure: ARTERIOVENOUS (AV)  FISTULA CREATION;  Surgeon: Angelia Mould, MD;  Location: [facility] OR;  Service: Vascular;  Laterality: Left;  Creation of left brachial cephalic arteriovenous fistula  . BIOPSY  06/14/2020   Procedure: BIOPSY;  Surgeon: Lavena Bullion, DO;  Location: [facility];  Service: Gastroenterology;;  . CATARACT EXTRACTION, BILATERAL  05/2018  . COLONOSCOPY  2013; 11/24/17   2013; Normal except diverticulosis (recall 10 yrs).  2018 (for GI bleeding)--severe diverticular dz + 2 adenomatous  polyps.  Recall 5 yrs.  . DG CHEST AP OR PA ([facility] HX)  06/03/2020   Bibasilar atelectasis. No focal consoliadation definitively identified.  . dilation for GERD    . ESOPHAGOGASTRODUODENOSCOPY  11/24/2017   gastric polyps x 2, otherwise normal.  . ESOPHAGOGASTRODUODENOSCOPY N/A 06/14/2020   +distal esophagitis, h pylori neg, mild distal esoph stenosis widened with forceps, benign gastric polypsProcedure: ESOPHAGOGASTRODUODENOSCOPY (EGD);  Surgeon: Lavena Bullion, DO;  Location: [facility];  Service: Gastroenterology;  Laterality: N/A;  Diagnostic EGD as patietn last took Plavix 11am on 06/12/2020  . KIDNEY TRANSPLANT  11/06/12   [place] (cadaveric)  . PROSTATE BIOPSY  2011   no malignancy  . TRANSTHORACIC ECHOCARDIOGRAM  11/2016   Normal LV systolic fxn, EF 98-11%.  Grade I DD.  Mild aortic root dilatation, mild MV regurg.    There were no vitals filed for this visit.   Subjective Assessment - 10/21/20 1020    Subjective My back is better, did fine after the shortened session last week. No current pain. My balance is getting better.    Pertinent History 2013 kidney transplant, June: espphagus hospitalization and expansion, 2017 CVA,    Limitations Lifting;Standing;Walking;House hold activities    Patient Stated Goals Improved balance, less stumbling / close falls    Currently in Pain? No/denies    Multiple Pain Sites No                             [facility] Adult PT Treatment/Exercise - 10/21/20 0001      Neuro Re-ed    Neuro Re-ed Details  high knee marching  side stepping  all 2 lengths of hard floor in clinic with SBA/CGA light: Small ROm head nod 2x hard floor distance with light CGA      Lumbar Exercises: Aerobic   Nustep L2 x 8 min       Knee/Hip Exercises: Standing   Heel Raises Both;1 set;15 reps    Hip Flexion Stengthening;Both;1 set;10 reps;Knee bent    Hip Flexion Limitations 2#    Rebounder 30 sec wt shifting 3 ways    Other Standing Knee  Exercises alt toe taps 20x light UE       Knee/Hip Exercises: Seated   Long Arc Quad Strengthening;Both;1 set;10 reps    Long Arc Quad Weight 2 lbs.    Marching Limitations Did in standing    Abd/Adduction Limitations yellow loop 25x    Sit to Sand 5 reps;without UE support                    PT Short Term Goals - 08/27/20 1536      PT SHORT TERM GOAL #1   Title Pt to be independent with intial HEP    Status Achieved      PT SHORT TERM GOAL #2   Title Pt to demo ability for stand/wait after sit to stand, to unsure no dizziness    Baseline every now and  then light headed due to low BP    Status Achieved      PT SHORT TERM GOAL #3   Title -      PT SHORT TERM GOAL #4   Title -      PT SHORT TERM GOAL #5   Title -             PT Long Term Goals - 09/09/20 1133      PT LONG TERM GOAL #1   Title Pt to be independent with final HEP    Baseline progressing balance challenges    Period Weeks    Status On-going      PT LONG TERM GOAL #2   Baseline 46/56 08/27/20, previously 42/56    Time 6    Period Weeks    Status On-going      PT LONG TERM GOAL #3   Title Pt to demo improved score on DGI by at least 5 points    Baseline improved from 15 to 16 points    Time 6    Period Weeks    Status On-going      PT LONG TERM GOAL #4   Title Pt to independenty/safely navigate at least 5 steps with 1 hand rail.    Time 6    Period Weeks    Status Achieved                 Plan - 10/21/20 1019    Clinical Impression Statement Pt reports the shorter session last week helped keep his back pain from flaring up. He agreed to participate in a  slightly longer session today but to still be mindful of his fatigue. Pt got to almost 30 minutes before LE fatigue heavily set in and some labored breathing. Pt verbally reported he felt good with what he accomplished and was ready to call it a day at that point in the sesison. Pt was able to add some resistance back in and  go longer on the Nustep today. no unsteadiness with balance exercises. Pt does need to make small head motion when walking due to vertigo.    Personal Factors and Comorbidities Fitness;Comorbidity 1;Comorbidity 2    Comorbidities 2013 kidney transplant, June: espphagus hospitalization and expansion, 2017 CVA,    Examination-Activity Limitations Locomotion Level;Squat;Stairs;Stand    Examination-Participation Restrictions Volunteer    Stability/Clinical Decision Making Evolving/Moderate complexity    Rehab Potential Good    PT Frequency 2x / week    PT Duration 8 weeks    PT Treatment/Interventions ADLs/Self Care Home Management;Canalith Repostioning;Cryotherapy;DME Instruction;Ultrasound;Traction;Moist Heat;Iontophoresis 4mg /ml Dexamethasone;Gait training;Stair training;Functional mobility training;Therapeutic activities;Therapeutic exercise;Balance training;Neuromuscular re-education;Manual techniques;Patient/family education;Passive range of motion;Energy conservation;Joint Manipulations;Spinal Manipulations;Vestibular;Taping;Vasopneumatic Device    PT Next Visit Plan See how back is and proceed per pt report, be mindful of fatigue levels, pt does not seem to respond well pushing beyond fatigue.    PT Home Exercise Plan [id number]    Consulted and Agree with Plan of Care Patient           Patient will benefit from skilled therapeutic intervention in order to improve the following deficits and impairments:  Abnormal gait, Decreased coordination, Difficulty walking, Decreased safety awareness, Decreased endurance, Decreased activity tolerance, Pain, Impaired flexibility, Decreased balance, Decreased knowledge of use of DME, Decreased mobility, Decreased strength  Visit Diagnosis: Other abnormalities of gait and mobility  Muscle weakness (generalized)     Problem List Patient Active Problem List   Diagnosis Date Noted  .  Peptic stricture of esophagus   . Emesis 06/13/2020  . Weight loss  06/13/2020  . Cough 06/13/2020  . AKI (acute kidney injury) ([place]) 06/13/2020  . Metabolic acidosis 77/02/4034  . Esophageal dysphagia   . Postinflammatory pulmonary fibrosis ([place]) 08/30/2019  . DOE (dyspnea on exertion) 08/29/2019  . Overweight (BMI 25.0-29.9) 07/27/2019  . GIB (gastrointestinal bleeding) 10/07/2017  . IFG (impaired fasting glucose) 06/08/2017  . Stroke ([place]) 11/30/2016  . Essential hypertension 01/16/2015  . S/p cadaver renal transplant 11/06/2012  . Chronic kidney disease (CKD), stage IV (severe) ([place]) 03/18/2012  . BENIGN PROSTATIC HYPERTROPHY 04/18/2008  . EXTRINSIC ASTHMA, UNSPECIFIED 12/19/2007  . PSA, INCREASED 12/09/2007  . Hypothyroidism 08/12/2007  . Bipolar disorder ([place]) 07/27/2007  . Hyperlipidemia 07/07/2007  . Gout 07/07/2007  . HYPERTENSION 07/07/2007  . GERD 07/07/2007    Armenia Silveria, PTA 10/21/2020, 10:52 AM  [facility] [address] [place], [place], [address] Phone: [phone]   Fax:  [phone]  Name: Curtis Jimenez MRN: [id number] Date of Birth: 1936/05/04

## 2020-10-23 ENCOUNTER — Other Ambulatory Visit: Payer: Self-pay

## 2020-10-23 ENCOUNTER — Encounter: Payer: Self-pay | Admitting: Physical Therapy

## 2020-10-23 ENCOUNTER — Ambulatory Visit: Payer: Medicare HMO | Admitting: Physical Therapy

## 2020-10-23 DIAGNOSIS — R2689 Other abnormalities of gait and mobility: Secondary | ICD-10-CM | POA: Diagnosis not present

## 2020-10-23 DIAGNOSIS — M6281 Muscle weakness (generalized): Secondary | ICD-10-CM | POA: Diagnosis not present

## 2020-10-23 NOTE — Therapy (Signed)
[facility] [address], [place], [place], [address] Phone: [phone]   Fax:  [phone]  Physical Therapy Treatment  Patient Details  Name: Curtis Jimenez MRN: [id number] Date of Birth: 1936/02/04 Referring Provider (PT): McGowen   Encounter Date: 10/23/2020   PT End of Session - 10/23/20 1149    Visit Number 17    Number of Visits 20    Date for PT Re-Evaluation 10/30/20    Authorization Type Aetna    PT Start Time 1147    PT Stop Time 1217   short session due to fatigue   PT Time Calculation (min) 30 min    Activity Tolerance Patient tolerated treatment well    Behavior During Therapy [facility] for tasks assessed/performed           Past Medical History:  Diagnosis Date  . Basal cell carcinoma    neck (skin MD 2X per year)  . Bifascicular block 2021   ectopy, asymptomatic.  Cards->observe  . Bipolar disorder ([place])   . BPH (benign prostatic hyperplasia)    with elevated PSA; followed by Dr. Rosana Hoes at [facility]  . Chronic renal insufficiency, stage III (moderate) (HCC)    in pt w/hx of renal transplant.  Post-transplant sCr 1.4-1.6.  Last renal f/u 05/01/19->Cr 1.63, GFR 39 ml/min  . Coronary atherosclerosis    LM and 3 V dz noted on CT 08/2019 performed for eval of interstitial lung dz in the setting of mild DOE and mild hypoxia. Cards->no stress tesing indicated->primary prevention emphasized  . CVA (cerebral vascular accident) ([place]) 11/2016   Pontine (vertebrobasilar--imaging neg), TPA given.  Pt discharged on plavix.  Carotid dopplers ok, echocardiogram ok.  Residual deficit: vertigo but this improved greatly with therapy.  . DOE (dyspnea on exertion) 2020   DOE and ? hypoxia->CXR 08/29/19--->diffuse interstitial opacities-? acute inflammatory process->Dr. Melvyn Novas eval'd him and was underwhelmed by the CXR and exam->CT chest 09/26/19 "Very subtle areas of mild ground-glass attenuatio-nonspecific", mild air  trapping. Per Dr. Melvyn Novas, no signif ILD, improving with inc ambulation (post-inflamm pulm fibrosis?)  . Esophagitis 06/14/2020   EGD->+distal esophagitis, h pylori neg, mild distal esoph stenosis widened with forceps, benign gastric polyps.  . Gallbladder polyp 2015   Asymptomatic  . GERD (gastroesophageal reflux disease)   . Gout   . History of adenomatous polyp of colon 2018   Recall 5 yrs  . Hyperlipidemia   . Hypertension   . Hypothyroidism   . Lumbar spondylosis   . Ptosis due to aging    Right  . Rectal bleeding 09/2017   Admitted for obs due to Hb drop.  GI consulted---obs recommended.  No transfusion required.  Plavix was d/c'd, ferrous sulfate recommended.  Outpt GI f/u--plavix restarted and upper endoscopy was normal and colonoscopy showed 2 polyps and severe diverticular dz. Iron d/c'd 04/24/19.  Marland Kitchen Restless legs syndrome   . S/p cadaver renal transplant 2013   Secondary to HTN +lithium toxicity over 30 yrs caused kidney destruction [facility] transplant MDs)  . Seborrheic dermatitis    eyebrows worst: Hytone rx'd by Dr. Denna Haggard.  . 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 AV FISTULA PLACEMENT  04/08/2012   Procedure: ARTERIOVENOUS (AV) FISTULA CREATION;  Surgeon: Angelia Mould,  MD;  Location: [facility] OR;  Service: Vascular;  Laterality: Left;  Creation of left brachial cephalic arteriovenous fistula  . BIOPSY  06/14/2020   Procedure: BIOPSY;  Surgeon: Lavena Bullion, DO;  [facility]: [facility];  Service: Gastroenterology;;  . CATARACT EXTRACTION, BILATERAL  05/2018  . COLONOSCOPY  2013; 11/24/17   2013; Normal except diverticulosis (recall 10 yrs).  2018 (for GI bleeding)--severe diverticular dz + 2 adenomatous polyps.  Recall 5 yrs.  . DG CHEST  AP OR PA ([facility] HX)  06/03/2020   Bibasilar atelectasis. No focal consoliadation definitively identified.  . dilation for GERD    . ESOPHAGOGASTRODUODENOSCOPY  11/24/2017   gastric polyps x 2, otherwise normal.  . ESOPHAGOGASTRODUODENOSCOPY N/A 06/14/2020   +distal esophagitis, h pylori neg, mild distal esoph stenosis widened with forceps, benign gastric polypsProcedure: ESOPHAGOGASTRODUODENOSCOPY (EGD);  Surgeon: Lavena Bullion, DO;  [facility];  Service: Gastroenterology;  Laterality: N/A;  Diagnostic EGD as patietn last took Plavix 11am on 06/12/2020  . KIDNEY TRANSPLANT  11/06/12   [place] (cadaveric)  . PROSTATE BIOPSY  2011   no malignancy  . TRANSTHORACIC ECHOCARDIOGRAM  11/2016   Normal LV systolic fxn, EF 67-12%.  Grade I DD.  Mild aortic root dilatation, mild MV regurg.    There were no vitals filed for this visit.   Subjective Assessment - 10/23/20 1149    Subjective Last session was excellent.  I have almost 0/10 back pain.    Pertinent History 2013 kidney transplant, June: espphagus hospitalization and expansion, 2017 CVA,    Limitations Lifting;Standing;Walking;House hold activities    Patient Stated Goals Improved balance, less stumbling / close falls    Currently in Pain? No/denies    Pain Type Chronic pain                             [facility] Adult PT Treatment/Exercise - 10/23/20 0001      Exercises   Exercises Lumbar;Knee/Hip;Shoulder      Lumbar Exercises: Aerobic   Nustep L2 x 8 min with lumbar heat, PT present to monitor fatigue and discuss plan for session      Knee/Hip Exercises: Machines for Strengthening   Total Gym Leg Press Seat 8 bil LEs 90lb 2 x 20      Knee/Hip Exercises: Standing   Heel Raises Both;1 set;15 reps    Hip Flexion Stengthening;Both;1 set;10 reps;Knee bent    Hip Flexion Limitations 2#    Rebounder 30 sec wt shifting 3 ways    Other Standing Knee Exercises alt toe taps 20x light UE       Knee/Hip  Exercises: Seated   Long Arc Quad Strengthening;Both;10 reps;Weights;2 sets    Long Arc Quad Weight 2 lbs.    Abd/Adduction Limitations yellow loop 25x    Sit to Sand 5 reps;without UE support               Balance Exercises - 10/23/20 0001      Balance Exercises: Standing   Gait with Head Turns Forward;2 reps   tile floor length x 2   Other Standing Exercises Comments high knee walks 2x40', and sidestepping 1x40' bil   SBA/CGA              PT Short Term Goals - 08/27/20 1536      PT SHORT TERM GOAL #1   Title Pt to be independent with intial HEP    Status Achieved  PT SHORT TERM GOAL #2   Title Pt to demo ability for stand/wait after sit to stand, to unsure no dizziness    Baseline every now and then light headed due to low BP    Status Achieved      PT SHORT TERM GOAL #3   Title -      PT SHORT TERM GOAL #4   Title -      PT SHORT TERM GOAL #5   Title -             PT Long Term Goals - 09/09/20 1133      PT LONG TERM GOAL #1   Title Pt to be independent with final HEP    Baseline progressing balance challenges    Period Weeks    Status On-going      PT LONG TERM GOAL #2   Baseline 46/56 08/27/20, previously 42/56    Time 6    Period Weeks    Status On-going      PT LONG TERM GOAL #3   Title Pt to demo improved score on DGI by at least 5 points    Baseline improved from 15 to 16 points    Time 6    Period Weeks    Status On-going      PT LONG TERM GOAL #4   Title Pt to independenty/safely navigate at least 5 steps with 1 hand rail.    Time 6    Period Weeks    Status Achieved                 Plan - 10/23/20 1223    Clinical Impression Statement Pt reported much improved response to PT session last visit.  He arrived with "nearly no back pain."  PT focused on same routine with alternating between standing and seated ther ex.  Pt is now able to perform counter exercises with sometimes no UE support (heel raises) or light  fingertip contact.  His gait and turns are much steadier and although foot placement is still narrow he was very steady and only needed SBA vs CGA.  He requested leg press today in addition to repeat of last session's exercises and said "I can feel my back but not bad" end of session.  He will be ready for d/c to HEP next week upon conclusion of his certification period.    Comorbidities 2013 kidney transplant, June: espphagus hospitalization and expansion, 2017 CVA,    PT Frequency 2x / week    PT Duration 8 weeks    PT Treatment/Interventions ADLs/Self Care Home Management;Canalith Repostioning;Cryotherapy;DME Instruction;Ultrasound;Traction;Moist Heat;Iontophoresis 4mg /ml Dexamethasone;Gait training;Stair training;Functional mobility training;Therapeutic activities;Therapeutic exercise;Balance training;Neuromuscular re-education;Manual techniques;Patient/family education;Passive range of motion;Energy conservation;Joint Manipulations;Spinal Manipulations;Vestibular;Taping;Vasopneumatic Device    PT Next Visit Plan See how back is and proceed per pt report, be mindful of fatigue levels, pt does not seem to respond well pushing beyond fatigue    PT Home Exercise Plan [id number]    Recommended Other Services end of cert Nov 3    Consulted and Agree with Plan of Care Patient           Patient will benefit from skilled therapeutic intervention in order to improve the following deficits and impairments:     Visit Diagnosis: Other abnormalities of gait and mobility  Muscle weakness (generalized)     Problem List Patient Active Problem List   Diagnosis Date Noted  . Peptic stricture of esophagus   . Emesis 06/13/2020  .  Weight loss 06/13/2020  . Cough 06/13/2020  . AKI (acute kidney injury) ([place]) 06/13/2020  . Metabolic acidosis 03/70/9643  . Esophageal dysphagia   . 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 DOE (dyspnea on exertion) 08/29/2019  . Overweight (BMI 25.0-29.9)  07/27/2019  . GIB (gastrointestinal bleeding) 10/07/2017  . IFG (impaired fasting glucose) 06/08/2017  . Stroke ([place]) 11/30/2016  . Essential hypertension 01/16/2015  . S/p cadaver renal transplant 11/06/2012  . Chronic kidney disease (CKD), stage IV (severe) ([place]) 03/18/2012  . BENIGN PROSTATIC HYPERTROPHY 04/18/2008  . EXTRINSIC ASTHMA, UNSPECIFIED 12/19/2007  . PSA, INCREASED 12/09/2007  . Hypothyroidism 08/12/2007  . Bipolar disorder ([place]) 07/27/2007  . Hyperlipidemia 07/07/2007  . Gout 07/07/2007  . HYPERTENSION 07/07/2007  . GERD 07/07/2007    Baruch Merl, PT 10/23/20 12:32 PM   [facility] [address], [place], [place], [address] Phone: [phone]   Fax:  [phone]  Name: Curtis Jimenez MRN: [id number] Date of Birth: 22-Jun-1936

## 2020-10-28 ENCOUNTER — Other Ambulatory Visit: Payer: Self-pay

## 2020-10-28 ENCOUNTER — Ambulatory Visit: Payer: Medicare HMO | Attending: Family Medicine | Admitting: Physical Therapy

## 2020-10-28 ENCOUNTER — Encounter: Payer: Self-pay | Admitting: Physical Therapy

## 2020-10-28 DIAGNOSIS — M6281 Muscle weakness (generalized): Secondary | ICD-10-CM | POA: Insufficient documentation

## 2020-10-28 DIAGNOSIS — R2689 Other abnormalities of gait and mobility: Secondary | ICD-10-CM

## 2020-10-28 NOTE — Therapy (Signed)
[facility] [address], [place], [place], [address] Phone: [phone]   Fax:  [phone]  Physical Therapy Treatment  Patient Details  Name: Curtis Jimenez MRN: [id number] Date of Birth: [birth year] Referring Provider (PT): McGowen   Encounter Date: 10/28/2020   PT End of Session - 10/28/20 1017    Visit Number 18    Number of Visits 20    Authorization Type Aetna    PT Start Time 3016    PT Stop Time 1044    PT Time Calculation (min) 29 min    Activity Tolerance Patient tolerated treatment well    Behavior During Therapy [facility] for tasks assessed/performed           Past Medical History:  Diagnosis Date  . Basal cell carcinoma    neck (skin MD 2X per year)  . Bifascicular block 2021   ectopy, asymptomatic.  Cards->observe  . Bipolar disorder ([place])   . BPH (benign prostatic hyperplasia)    with elevated PSA; followed by Dr. Rosana Hoes at [facility]  . Chronic renal insufficiency, stage III (moderate) (HCC)    in pt w/hx of renal transplant.  Post-transplant sCr 1.4-1.6.  Last renal f/u 05/01/19->Cr 1.63, GFR 39 ml/min  . Coronary atherosclerosis    LM and 3 V dz noted on CT 08/2019 performed for eval of interstitial lung dz in the setting of mild DOE and mild hypoxia. Cards->no stress tesing indicated->primary prevention emphasized  . CVA (cerebral vascular accident) ([place]) 11/2016   Pontine (vertebrobasilar--imaging neg), TPA given.  Pt discharged on plavix.  Carotid dopplers ok, echocardiogram ok.  Residual deficit: vertigo but this improved greatly with therapy.  . DOE (dyspnea on exertion) 2020   DOE and ? hypoxia->CXR 08/29/19--->diffuse interstitial opacities-? acute inflammatory process->Dr. Melvyn Novas eval'd him and was underwhelmed by the CXR and exam->CT chest 09/26/19 "Very subtle areas of mild ground-glass attenuatio-nonspecific", mild air trapping. Per Dr. Melvyn Novas, no signif ILD, improving with inc ambulation  (post-inflamm pulm fibrosis?)  . Esophagitis 06/14/2020   EGD->+distal esophagitis, h pylori neg, mild distal esoph stenosis widened with forceps, benign gastric polyps.  . Gallbladder polyp 2015   Asymptomatic  . GERD (gastroesophageal reflux disease)   . Gout   . History of adenomatous polyp of colon 2018   Recall 5 yrs  . Hyperlipidemia   . Hypertension   . Hypothyroidism   . Lumbar spondylosis   . Ptosis due to aging    Right  . Rectal bleeding 09/2017   Admitted for obs due to Hb drop.  GI consulted---obs recommended.  No transfusion required.  Plavix was d/c'd, ferrous sulfate recommended.  Outpt GI f/u--plavix restarted and upper endoscopy was normal and colonoscopy showed 2 polyps and severe diverticular dz. Iron d/c'd 04/24/19.  Marland Kitchen Restless legs syndrome   . S/p cadaver renal transplant 2013   Secondary to HTN +lithium toxicity over 30 yrs caused kidney destruction [facility] transplant MDs)  . Seborrheic dermatitis    eyebrows worst: Hytone rx'd by Dr. Denna Haggard.  . 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 AV FISTULA PLACEMENT  04/08/2012   Procedure: ARTERIOVENOUS (AV) FISTULA CREATION;  Surgeon: Angelia Mould, MD;  Location: [place];  Service: Vascular;  Laterality: Left;  Creation of  left brachial cephalic arteriovenous fistula  . BIOPSY  06/14/2020   Procedure: BIOPSY;  Surgeon: Lavena Bullion, DO;  [facility];  Service: Gastroenterology;;  . CATARACT EXTRACTION, BILATERAL  05/2018  . COLONOSCOPY  2013; 11/24/17   2013; Normal except diverticulosis (recall 10 yrs).  2018 (for GI bleeding)--severe diverticular dz + 2 adenomatous polyps.  Recall 5 yrs.  . DG CHEST AP OR PA ([facility] HX)  06/03/2020   Bibasilar atelectasis. No focal  consoliadation definitively identified.  . dilation for GERD    . ESOPHAGOGASTRODUODENOSCOPY  11/24/2017   gastric polyps x 2, otherwise normal.  . ESOPHAGOGASTRODUODENOSCOPY N/A 06/14/2020   +distal esophagitis, h pylori neg, mild distal esoph stenosis widened with forceps, benign gastric polypsProcedure: ESOPHAGOGASTRODUODENOSCOPY (EGD);  Surgeon: Lavena Bullion, DO;  [facility];  Service: Gastroenterology;  Laterality: N/A;  Diagnostic EGD as patietn last took Plavix 11am on 06/12/2020  . KIDNEY TRANSPLANT  11/06/12   [place] (cadaveric)  . PROSTATE BIOPSY  2011   no malignancy  . TRANSTHORACIC ECHOCARDIOGRAM  11/2016   Normal LV systolic fxn, EF 30-86%.  Grade I DD.  Mild aortic root dilatation, mild MV regurg.    There were no vitals filed for this visit.   Subjective Assessment - 10/28/20 1023    Subjective My back is doing ok, better.    Pertinent History 2013 kidney transplant, June: espphagus hospitalization and expansion, 2017 CVA,    Currently in Pain? Yes    Pain Score 5     Pain Location Back    Pain Orientation Lower    Pain Descriptors / Indicators Dull;Aching    Aggravating Factors  AM, Overdoing it    Pain Relieving Factors cane    Multiple Pain Sites No                             [facility] Adult PT Treatment/Exercise - 10/28/20 0001      Lumbar Exercises: Aerobic   Nustep L2 x 8 min at conclusion of session      Knee/Hip Exercises: Machines for Strengthening   Total Gym Leg Press Seat 8 Bil LE 110# 3x10      Knee/Hip Exercises: Standing   Heel Raises Both;1 set;20 reps    Hip Flexion Stengthening;Both;1 set;10 reps;Knee bent    Hip Flexion Limitations --   2.5   Rebounder 30 sec wt shifting 3 ways      Knee/Hip Exercises: Seated   Long Arc Quad Strengthening;Both;1 set;10 reps;Weights    Long Arc Quad Weight --   2.5   Abd/Adduction Limitations yellow loop 25x    Sit to [employer] 2 sets;5 reps;without UE support                     PT Short Term Goals - 08/27/20 1536      PT SHORT TERM GOAL #1   Title Pt to be independent with intial HEP    Status Achieved      PT SHORT TERM GOAL #2   Title Pt to demo ability for stand/wait after sit to stand, to unsure no dizziness    Baseline every now and then light headed due to low BP    Status Achieved      PT SHORT TERM GOAL #3   Title -      PT SHORT TERM GOAL #4   Title -      PT SHORT TERM  GOAL #5   Title -             PT Long Term Goals - 09/09/20 1133      PT LONG TERM GOAL #1   Title Pt to be independent with final HEP    Baseline progressing balance challenges    Period Weeks    Status On-going      PT LONG TERM GOAL #2   Baseline 46/56 08/27/20, previously 42/56    Time 6    Period Weeks    Status On-going      PT LONG TERM GOAL #3   Title Pt to demo improved score on DGI by at least 5 points    Baseline improved from 15 to 16 points    Time 6    Period Weeks    Status On-going      PT LONG TERM GOAL #4   Title Pt to independenty/safely navigate at least 5 steps with 1 hand rail.    Time 6    Period Weeks    Status Achieved                 Plan - 10/28/20 1017    Clinical Impression Statement Back pain has been fairly stable over the last few weeks. Pt is currently looking to purchase some ankle weights for home. Pt did tolerate slight increases in weight for quad strength, RTLE appears weaker than LT.    Personal Factors and Comorbidities Fitness;Comorbidity 1;Comorbidity 2    Comorbidities 2013 kidney transplant, June: espphagus hospitalization and expansion, 2017 CVA,    Examination-Activity Limitations Locomotion Level;Squat;Stairs;Stand    Examination-Participation Restrictions Volunteer    Stability/Clinical Decision Making Evolving/Moderate complexity    Rehab Potential Good    PT Frequency 2x / week    PT Duration 8 weeks    PT Treatment/Interventions ADLs/Self Care Home Management;Canalith  Repostioning;Cryotherapy;DME Instruction;Ultrasound;Traction;Moist Heat;Iontophoresis 4mg /ml Dexamethasone;Gait training;Stair training;Functional mobility training;Therapeutic activities;Therapeutic exercise;Balance training;Neuromuscular re-education;Manual techniques;Patient/family education;Passive range of motion;Energy conservation;Joint Manipulations;Spinal Manipulations;Vestibular;Taping;Vasopneumatic Device    PT Next Visit Plan Probable DC to HEP next    PT Home Exercise Plan [id number]    Consulted and Agree with Plan of Care Patient           Patient will benefit from skilled therapeutic intervention in order to improve the following deficits and impairments:  Abnormal gait, Decreased coordination, Difficulty walking, Decreased safety awareness, Decreased endurance, Decreased activity tolerance, Pain, Impaired flexibility, Decreased balance, Decreased knowledge of use of DME, Decreased mobility, Decreased strength  Visit Diagnosis: Other abnormalities of gait and mobility  Muscle weakness (generalized)     Problem List Patient Active Problem List   Diagnosis Date Noted  . Peptic stricture of esophagus   . Emesis 06/13/2020  . Weight loss 06/13/2020  . Cough 06/13/2020  . AKI (acute kidney injury) ([place]) 06/13/2020  . Metabolic acidosis 28/36/6294  . Esophageal dysphagia   . Postinflammatory pulmonary fibrosis ([place]) 08/30/2019  . DOE (dyspnea on exertion) 08/29/2019  . Overweight (BMI 25.0-29.9) 07/27/2019  . GIB (gastrointestinal bleeding) 10/07/2017  . IFG (impaired fasting glucose) 06/08/2017  . Stroke ([place]) 11/30/2016  . Essential hypertension 01/16/2015  . S/p cadaver renal transplant 11/06/2012  . Chronic kidney disease (CKD), stage IV (severe) ([place]) 03/18/2012  . BENIGN PROSTATIC HYPERTROPHY 04/18/2008  . EXTRINSIC ASTHMA, UNSPECIFIED 12/19/2007  . PSA, INCREASED 12/09/2007  . Hypothyroidism 08/12/2007  . Bipolar disorder ([place]) 07/27/2007  . Hyperlipidemia  07/07/2007  . Gout 07/07/2007  . HYPERTENSION 07/07/2007  .  GERD 07/07/2007    Curtis Jimenez, PTA 10/28/2020, 10:45 AM  [facility] [address], [place], [place], [address] Phone: [phone]   Fax:  [phone]  Name: Curtis Jimenez MRN: [id number] Date of Birth: December 26, 1936

## 2020-10-30 ENCOUNTER — Encounter: Payer: Self-pay | Admitting: Physical Therapy

## 2020-10-30 ENCOUNTER — Ambulatory Visit: Payer: Medicare HMO | Admitting: Physical Therapy

## 2020-10-30 ENCOUNTER — Other Ambulatory Visit: Payer: Self-pay

## 2020-10-30 DIAGNOSIS — R2689 Other abnormalities of gait and mobility: Secondary | ICD-10-CM

## 2020-10-30 DIAGNOSIS — M6281 Muscle weakness (generalized): Secondary | ICD-10-CM | POA: Diagnosis not present

## 2020-10-30 NOTE — Therapy (Signed)
[facility] [address], [place], [place], [address] Phone: [phone]   Fax:  [phone]  Physical Therapy Treatment  Patient Details  Name: Curtis Jimenez MRN: [id number] Date of Birth: 03-07-1936 Referring Provider (PT): McGowen   Encounter Date: 10/30/2020   PT End of Session - 10/30/20 1038    Visit Number 19    Number of Visits 20    Date for PT Re-Evaluation 10/30/20    Authorization Type Aetna    PT Start Time 1017    PT Stop Time 1058    PT Time Calculation (min) 41 min    Activity Tolerance Patient tolerated treatment well    Behavior During Therapy [facility] for tasks assessed/performed           Past Medical History:  Diagnosis Date  . Basal cell carcinoma    neck (skin MD 2X per year)  . Bifascicular block 2021   ectopy, asymptomatic.  Cards->observe  . Bipolar disorder ([place])   . BPH (benign prostatic hyperplasia)    with elevated PSA; followed by Dr. Rosana Hoes at [facility]  . Chronic renal insufficiency, stage III (moderate) (HCC)    in pt w/hx of renal transplant.  Post-transplant sCr 1.4-1.6.  Last renal f/u 05/01/19->Cr 1.63, GFR 39 ml/min  . Coronary atherosclerosis    LM and 3 V dz noted on CT 08/2019 performed for eval of interstitial lung dz in the setting of mild DOE and mild hypoxia. Cards->no stress tesing indicated->primary prevention emphasized  . CVA (cerebral vascular accident) ([place]) 11/2016   Pontine (vertebrobasilar--imaging neg), TPA given.  Pt discharged on plavix.  Carotid dopplers ok, echocardiogram ok.  Residual deficit: vertigo but this improved greatly with therapy.  . DOE (dyspnea on exertion) 2020   DOE and ? hypoxia->CXR 08/29/19--->diffuse interstitial opacities-? acute inflammatory process->Dr. Melvyn Novas eval'd him and was underwhelmed by the CXR and exam->CT chest 09/26/19 "Very subtle areas of mild ground-glass attenuatio-nonspecific", mild air trapping. Per Dr. Melvyn Novas, no  signif ILD, improving with inc ambulation (post-inflamm pulm fibrosis?)  . Esophagitis 06/14/2020   EGD->+distal esophagitis, h pylori neg, mild distal esoph stenosis widened with forceps, benign gastric polyps.  . Gallbladder polyp 2015   Asymptomatic  . GERD (gastroesophageal reflux disease)   . Gout   . History of adenomatous polyp of colon 2018   Recall 5 yrs  . Hyperlipidemia   . Hypertension   . Hypothyroidism   . Lumbar spondylosis   . Ptosis due to aging    Right  . Rectal bleeding 09/2017   Admitted for obs due to Hb drop.  GI consulted---obs recommended.  No transfusion required.  Plavix was d/c'd, ferrous sulfate recommended.  Outpt GI f/u--plavix restarted and upper endoscopy was normal and colonoscopy showed 2 polyps and severe diverticular dz. Iron d/c'd 04/24/19.  Marland Kitchen Restless legs syndrome   . S/p cadaver renal transplant 2013   Secondary to HTN +lithium toxicity over 30 yrs caused kidney destruction [facility] transplant MDs)  . Seborrheic dermatitis    eyebrows worst: Hytone rx'd by Dr. Denna Haggard.  . 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 AV FISTULA PLACEMENT  04/08/2012   Procedure: ARTERIOVENOUS (AV) FISTULA CREATION;  Surgeon: Angelia Mould, MD;  Location: [place];  Service: Vascular;  Laterality: Left;  Creation of left brachial cephalic arteriovenous fistula  . BIOPSY  06/14/2020   Procedure: BIOPSY;  Surgeon: Lavena Bullion, DO;  [facility];  Service: Gastroenterology;;  . CATARACT EXTRACTION, BILATERAL  05/2018  . COLONOSCOPY  2013; 11/24/17   2013; Normal except diverticulosis (recall 10 yrs).  2018 (for GI bleeding)--severe diverticular dz + 2 adenomatous polyps.  Recall 5 yrs.  . DG CHEST AP OR PA ([facility] HX)   06/03/2020   Bibasilar atelectasis. No focal consoliadation definitively identified.  . dilation for GERD    . ESOPHAGOGASTRODUODENOSCOPY  11/24/2017   gastric polyps x 2, otherwise normal.  . ESOPHAGOGASTRODUODENOSCOPY N/A 06/14/2020   +distal esophagitis, h pylori neg, mild distal esoph stenosis widened with forceps, benign gastric polypsProcedure: ESOPHAGOGASTRODUODENOSCOPY (EGD);  Surgeon: Lavena Bullion, DO;  [facility];  Service: Gastroenterology;  Laterality: N/A;  Diagnostic EGD as patietn last took Plavix 11am on 06/12/2020  . KIDNEY TRANSPLANT  11/06/12   [place] (cadaveric)  . PROSTATE BIOPSY  2011   no malignancy  . TRANSTHORACIC ECHOCARDIOGRAM  11/2016   Normal LV systolic fxn, EF 51-10%.  Grade I DD.  Mild aortic root dilatation, mild MV regurg.    There were no vitals filed for this visit.   Subjective Assessment - 10/30/20 1019    Subjective My back is fine.  I'm ready for d/c.    Pertinent History 2013 kidney transplant, June: espphagus hospitalization and expansion, 2017 CVA,    Limitations Lifting;Standing;Walking;House hold activities    Patient Stated Goals Improved balance, less stumbling / close falls    Currently in Pain? No/denies              [facility] PT Assessment - 10/30/20 0001      Berg Balance Test   Sit to Stand Able to stand without using hands and stabilize independently    Standing Unsupported Able to stand safely 2 minutes    Sitting with Back Unsupported but Feet Supported on Floor or Stool Able to sit safely and securely 2 minutes    Stand to Sit Sits safely with minimal use of hands    Transfers Able to transfer safely, minor use of hands    Standing Unsupported with Eyes Closed Able to stand 10 seconds safely    Standing Unsupported with Feet Together Able to place feet together independently and stand 1 minute safely    From Standing, Reach Forward with Outstretched Arm Can reach confidently >25 cm (10")    From Standing  Position, Pick up Object from Floor Able to pick up shoe safely and easily    From Standing Position, Turn to Look Behind Over each Shoulder Looks behind from both sides and weight shifts well    Turn 360 Degrees Able to turn 360 degrees safely in 4 seconds or less    Standing Unsupported, Alternately Place Feet on Step/Stool Able to stand independently and safely and complete 8 steps in 20 seconds    Standing Unsupported, One Foot in Front Able to place foot tandem independently and hold 30 seconds    Standing on One Leg Able to lift leg independently and hold 5-10 seconds    Total Score 55    Berg comment: signif improvement, less fall risk      Dynamic Gait Index   Level Surface Normal    Change in Gait Speed Normal    Gait with Horizontal Head Turns Mild Impairment    Gait with  Vertical Head Turns Mild Impairment    Gait and Pivot Turn Normal    Step Over Obstacle Normal    Step Around Obstacles Normal    Steps Normal    Total Score 22    DGI comment: much improved                         [facility] Adult PT Treatment/Exercise - 10/30/20 0001      Neuro Re-ed    Neuro Re-ed Details  balance challenges within BERG and DGI assessments - SLS, tandem stance, 360 turns, gait with head turns, gait with cone weave and step overs (pool noodles), gait with pivot turn      Lumbar Exercises: Aerobic   Nustep L2 x 8' PT present to review goals      Knee/Hip Exercises: Machines for Strengthening   Total Gym Leg Press Seat 8 Bil LE 110# 3x10      Knee/Hip Exercises: Standing   Heel Raises Left;Right;1 set;5 reps    Heel Raises Limitations single leg x 5 each, bil raise eccentric single leg lowering x 3 each    Rebounder 30 sec wt shifting 3 ways      Knee/Hip Exercises: Seated   Long Arc Quad Strengthening;Both;1 set;10 reps;Weights    Long Arc Quad Weight --   2.5   Abd/Adduction Limitations yellow loop 25x                    PT Short Term Goals - 08/27/20 1536       PT SHORT TERM GOAL #1   Title Pt to be independent with intial HEP    Status Achieved      PT SHORT TERM GOAL #2   Title Pt to demo ability for stand/wait after sit to stand, to unsure no dizziness    Baseline every now and then light headed due to low BP    Status Achieved      PT SHORT TERM GOAL #3   Title -      PT SHORT TERM GOAL #4   Title -      PT SHORT TERM GOAL #5   Title -             PT Long Term Goals - 10/30/20 1042      PT LONG TERM GOAL #1   Title Pt to be independent with final HEP    Status Achieved      PT LONG TERM GOAL #2   Title Pt to demo improved score on BERG by at least 5 points, for improved balance and fall risk.    Baseline 55 10/30/20    Status Achieved      PT LONG TERM GOAL #3   Title Pt to demo improved score on DGI by at least 5 points    Baseline 22 10/30/20    Status Achieved      PT LONG TERM GOAL #4   Title Pt to independenty/safely navigate at least 5 steps with 1 hand rail.    Status Achieved      PT LONG TERM GOAL #5   Title Pt to demo ability for ambulation on uneven/outdoor surface and curb negotiation, to improve safety with community activity    Baseline this was a goal from last episode of care    Status Deferred                 Plan - 10/30/20  1039    Clinical Impression Statement Back pain was 0/10 on arrival and Pt with good tolerance of PT over past 2 weeks.  Re-eval today revealed no/low fall risk given signif improvement on Berg (score 55 compared to 42 at initial visit) and DGI (score 22 today compared to 15 at initial visit).  Pt is ind with HEP and compliant.  PT discusssed HEP 3-4 days a week with respect of back fatigue and history of spasms, and ongoing use of heat for back stiffness as needed.  Pt is ready to D/C to HEP and understands he may return with a new MD Rx as needed should he have any set backs.    Comorbidities 2013 kidney transplant, June: espphagus hospitalization and expansion, 2017  CVA,    PT Frequency 2x / week    PT Duration 8 weeks    PT Treatment/Interventions ADLs/Self Care Home Management;Canalith Repostioning;Cryotherapy;DME Instruction;Ultrasound;Traction;Moist Heat;Iontophoresis 87m/ml Dexamethasone;Gait training;Stair training;Functional mobility training;Therapeutic activities;Therapeutic exercise;Balance training;Neuromuscular re-education;Manual techniques;Patient/family education;Passive range of motion;Energy conservation;Joint Manipulations;Spinal Manipulations;Vestibular;Taping;Vasopneumatic Device    PT Next Visit Plan d/c to HEP    PT Home Exercise Plan [id number]    Consulted and Agree with Plan of Care Patient           Patient will benefit from skilled therapeutic intervention in order to improve the following deficits and impairments:     Visit Diagnosis: Other abnormalities of gait and mobility  Muscle weakness (generalized)     Problem List Patient Active Problem List   Diagnosis Date Noted  . Peptic stricture of esophagus   . Emesis 06/13/2020  . Weight loss 06/13/2020  . Cough 06/13/2020  . AKI (acute kidney injury) ([place] 06/13/2020  . Metabolic acidosis 050/93/2671 . Esophageal dysphagia   . Postinflammatory pulmonary fibrosis ([place] 08/30/2019  . DOE (dyspnea on exertion) 08/29/2019  . Overweight (BMI 25.0-29.9) 07/27/2019  . GIB (gastrointestinal bleeding) 10/07/2017  . IFG (impaired fasting glucose) 06/08/2017  . [place]roke ([place] 11/30/2016  . Essential hypertension 01/16/2015  . S/p cadaver renal transplant 11/06/2012  . Chronic kidney disease (CKD), stage IV (severe) ([place] 03/18/2012  . BENIGN PROSTATIC HYPERTROPHY 04/18/2008  . EXTRINSIC ASTHMA, UNSPECIFIED 12/19/2007  . PSA, INCREASED 12/09/2007  . Hypothyroidism 08/12/2007  . Bipolar disorder ([place] 07/27/2007  . Hyperlipidemia 07/07/2007  . Gout 07/07/2007  . HYPERTENSION 07/07/2007  . GERD 07/07/2007    Gwenevere Goga, PT 10/30/20 11:00 AM  PHYSICAL THERAPY  DISCHARGE SUMMARY  Visits from Start of Care: 19  Current functional level related to goals / functional outcomes: See above   Remaining deficits: See above   Education / Equipment: HEP Plan: Patient agrees to discharge.  Patient goals were met. Patient is being discharged due to meeting the stated rehab goals.  ?????         JBaruch Merl PT 10/30/20 11:01 AM   [facility] [address] [place] [address]Phone: [phone]  Fax:  [phone] Name: Curtis SOUTHMRN: [id number] of Birth: 123-Oct-1937

## 2020-11-01 NOTE — Addendum Note (Signed)
Addended by: Lavonna Monarch on: 11/01/2020 09:36 AM   Modules accepted: Level of Service

## 2020-11-11 ENCOUNTER — Other Ambulatory Visit: Payer: Self-pay | Admitting: Family Medicine

## 2020-11-11 ENCOUNTER — Other Ambulatory Visit: Payer: Self-pay | Admitting: Physician Assistant

## 2020-11-12 ENCOUNTER — Other Ambulatory Visit: Payer: Self-pay

## 2020-11-28 DIAGNOSIS — N183 Chronic kidney disease, stage 3 unspecified: Secondary | ICD-10-CM | POA: Diagnosis not present

## 2020-11-28 LAB — HEPATIC FUNCTION PANEL
ALT: 7 — AB (ref 10–40)
AST: 10 — AB (ref 14–40)
Alkaline Phosphatase: 68 (ref 25–125)
Bilirubin, Total: 0.7

## 2020-11-28 LAB — CBC AND DIFFERENTIAL
HCT: 43 (ref 41–53)
Hemoglobin: 14.1 (ref 13.5–17.5)
Neutrophils Absolute: 3.1
Platelets: 181 (ref 150–399)
WBC: 4.3

## 2020-11-28 LAB — COMPREHENSIVE METABOLIC PANEL
Albumin: 4 (ref 3.5–5.0)
Calcium: 9.7 (ref 8.7–10.7)
GFR calc Af Amer: 38
GFR calc non Af Amer: 33
Globulin: 1.6

## 2020-11-28 LAB — BASIC METABOLIC PANEL
BUN: 23 — AB (ref 4–21)
CO2: 26 — AB (ref 13–22)
Chloride: 106 (ref 99–108)
Creatinine: 1.9 — AB (ref 0.6–1.3)
Glucose: 154
Potassium: 4.5 (ref 3.4–5.3)
Sodium: 139 (ref 137–147)

## 2020-11-28 LAB — CBC: RBC: 4.89 (ref 3.87–5.11)

## 2020-11-30 ENCOUNTER — Other Ambulatory Visit: Payer: Self-pay | Admitting: Family Medicine

## 2020-12-05 DIAGNOSIS — Z940 Kidney transplant status: Secondary | ICD-10-CM | POA: Diagnosis not present

## 2020-12-05 DIAGNOSIS — R0902 Hypoxemia: Secondary | ICD-10-CM | POA: Diagnosis not present

## 2020-12-05 DIAGNOSIS — K922 Gastrointestinal hemorrhage, unspecified: Secondary | ICD-10-CM | POA: Diagnosis not present

## 2020-12-05 DIAGNOSIS — M7989 Other specified soft tissue disorders: Secondary | ICD-10-CM | POA: Diagnosis not present

## 2020-12-05 DIAGNOSIS — I129 Hypertensive chronic kidney disease with stage 1 through stage 4 chronic kidney disease, or unspecified chronic kidney disease: Secondary | ICD-10-CM | POA: Diagnosis not present

## 2020-12-05 DIAGNOSIS — R739 Hyperglycemia, unspecified: Secondary | ICD-10-CM | POA: Diagnosis not present

## 2020-12-05 DIAGNOSIS — M109 Gout, unspecified: Secondary | ICD-10-CM | POA: Diagnosis not present

## 2020-12-05 DIAGNOSIS — E669 Obesity, unspecified: Secondary | ICD-10-CM | POA: Diagnosis not present

## 2020-12-28 DIAGNOSIS — K828 Other specified diseases of gallbladder: Secondary | ICD-10-CM

## 2020-12-28 HISTORY — DX: Other specified diseases of gallbladder: K82.8

## 2021-01-15 NOTE — Progress Notes (Signed)
   Follow-Up Visit   Subjective  Curtis Jimenez is a 85 y.o. male who presents for the following: Procedure (scc--left temporal scalp).  SCCA Location: Scalp Duration:  Quality:  Associated Signs/Symptoms: Modifying Factors:  Severity:  Timing: Context: For treatment  Objective  Well appearing patient in no apparent distress; mood and affect are within normal limits. Objective  Left Temporal Scalp: Biopsy site identified by CMA and me.   Prev BX- [id number]    A focused examination was performed including Head and neck.. Relevant physical exam findings are noted in the Assessment and Plan.   Assessment & Plan    Squamous cell carcinoma in situ (SCCIS) of skin Left Temporal Scalp  Destruction of lesion Complexity: simple   Destruction method: electrodesiccation and curettage   Informed consent: discussed and consent obtained   Timeout:  patient name, date of birth, surgical site, and procedure verified Anesthesia: the lesion was anesthetized in a standard fashion   Anesthetic:  1% lidocaine w/ epinephrine 1-100,000 local infiltration Curettage performed in three different directions: Yes   Curettage cycles:  3 Lesion length (cm):  1 Lesion width (cm):  1.5 Margin per side (cm):  0 Final wound size (cm):  1.5 Hemostasis achieved with:  ferric subsulfate Outcome: patient tolerated procedure well with no complications   Additional details:  Wound innoculated with 5 fluorouracil solution.     I, Lavonna Monarch, MD, have reviewed all documentation for this visit.  The documentation on 01/15/21 for the exam, diagnosis, procedures, and orders are all accurate and complete.

## 2021-01-21 ENCOUNTER — Ambulatory Visit (INDEPENDENT_AMBULATORY_CARE_PROVIDER_SITE_OTHER): Payer: Medicare HMO | Admitting: Family Medicine

## 2021-01-21 ENCOUNTER — Encounter: Payer: Self-pay | Admitting: Family Medicine

## 2021-01-21 ENCOUNTER — Other Ambulatory Visit: Payer: Self-pay

## 2021-01-21 VITALS — BP 102/58 | HR 74 | Temp 97.4°F | Resp 16 | Ht 69.25 in | Wt 182.4 lb

## 2021-01-21 DIAGNOSIS — Z0000 Encounter for general adult medical examination without abnormal findings: Secondary | ICD-10-CM

## 2021-01-21 DIAGNOSIS — R7301 Impaired fasting glucose: Secondary | ICD-10-CM | POA: Diagnosis not present

## 2021-01-21 DIAGNOSIS — R42 Dizziness and giddiness: Secondary | ICD-10-CM | POA: Diagnosis not present

## 2021-01-21 DIAGNOSIS — E039 Hypothyroidism, unspecified: Secondary | ICD-10-CM | POA: Diagnosis not present

## 2021-01-21 DIAGNOSIS — I10 Essential (primary) hypertension: Secondary | ICD-10-CM

## 2021-01-21 DIAGNOSIS — Z940 Kidney transplant status: Secondary | ICD-10-CM

## 2021-01-21 DIAGNOSIS — E7800 Pure hypercholesterolemia, unspecified: Secondary | ICD-10-CM | POA: Diagnosis not present

## 2021-01-21 DIAGNOSIS — N183 Chronic kidney disease, stage 3 unspecified: Secondary | ICD-10-CM

## 2021-01-21 NOTE — Progress Notes (Signed)
Office Note 01/21/2021  CC:  Chief Complaint  Patient presents with  . Annual Exam    Pt is fasting    HPI:  Curtis Jimenez is a 85 y.o. White male who is here for annual health maintenance exam and 6 mo f/u HTN, CRI III in the setting of hx of renal transplant, HLD, hypothyroidism. A/P as of last visit: "1) Cough, nonproductive, gradually improving. Suspect due to signif esoph/post pharyngeal mucosal irritation from GERD. Expectant mgmt.  Fortunately, EGD and dilation for his esoph stenosis has resulted in great results regarding his dysphagia. He will be decreasing his pantoprazole to qd after he finishes his current bottle of med. 2) HTN: stable. Continue 1/2 of 12.5mg  carvedilol bid. Cr stable and lytes normal 06/28/20.  3) HLD: tolerating statin. Due for FLP monitoring. Hepatic panel normal 06/28/20.  4) CRI III, hx of renal transplant: Cr stable and lytes normal 06/28/20. Cont routine f/u with nephrologist.  5) Hypothyroidism: taking synthroid correctly. TSH today."  INTERIM HX: Feeling well. Has occ mild dizziness problem while standing and if turns, no orthostatic dizziness.  No vertigo or presyncope.      Past Medical History:  Diagnosis Date  . Basal cell carcinoma    neck (skin MD 2X per year)  . Bifascicular block 2021   ectopy, asymptomatic.  Cards->observe  . Bipolar disorder ([place])   . BPH (benign prostatic hyperplasia)    with elevated PSA; followed by Dr. Rosana Hoes at [facility]  . Chronic renal insufficiency, stage III (moderate) (HCC)    in pt w/hx of renal transplant.  Post-transplant sCr 1.4-1.6.  Last renal f/u 05/01/19->Cr 1.63, GFR 39 ml/min  . Coronary atherosclerosis    LM and 3 V dz noted on CT 08/2019 performed for eval of interstitial lung dz in the setting of mild DOE and mild hypoxia. Cards->no stress tesing indicated->primary prevention emphasized  . CVA (cerebral vascular accident) ([place]) 11/2016   Pontine  (vertebrobasilar--imaging neg), TPA given.  Pt discharged on plavix.  Carotid dopplers ok, echocardiogram ok.  Residual deficit: vertigo but this improved greatly with therapy.  . DOE (dyspnea on exertion) 2020   DOE and ? hypoxia->CXR 08/29/19--->diffuse interstitial opacities-? acute inflammatory process->Dr. Melvyn Novas eval'd him and was underwhelmed by the CXR and exam->CT chest 09/26/19 "Very subtle areas of mild ground-glass attenuatio-nonspecific", mild air trapping. Per Dr. Melvyn Novas, no signif ILD, improving with inc ambulation (post-inflamm pulm fibrosis?)  . Esophagitis 06/14/2020   EGD->+distal esophagitis, h pylori neg, mild distal esoph stenosis widened with forceps, benign gastric polyps.  . Gallbladder polyp 2015   Asymptomatic  . GERD (gastroesophageal reflux disease)   . Gout   . History of adenomatous polyp of colon 2018   Recall 5 yrs  . Hyperlipidemia   . Hypertension   . Hypothyroidism   . Lumbar spondylosis   . Ptosis due to aging    Right  . Rectal bleeding 09/2017   Admitted for obs due to Hb drop.  GI consulted---obs recommended.  No transfusion required.  Plavix was d/c'd, ferrous sulfate recommended.  Outpt GI f/u--plavix restarted and upper endoscopy was normal and colonoscopy showed 2 polyps and severe diverticular dz. Iron d/c'd 04/24/19.  Marland Kitchen Restless legs syndrome   . S/p cadaver renal transplant 2013   Secondary to HTN +lithium toxicity over 30 yrs caused kidney destruction [facility] transplant MDs)  . Seborrheic dermatitis    eyebrows worst: Hytone rx'd by Dr. Denna Haggard.  . 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 AV FISTULA PLACEMENT  04/08/2012   Procedure: ARTERIOVENOUS (AV) FISTULA CREATION;  Surgeon: Angelia Mould, MD;  Location: [facility]  OR;  Service: Vascular;  Laterality: Left;  Creation of left brachial cephalic arteriovenous fistula  . BIOPSY  06/14/2020   Procedure: BIOPSY;  Surgeon: Lavena Bullion, DO;  Location: [facility]COPY;  Service: Gastroenterology;;  . CATARACT EXTRACTION, BILATERAL  05/2018  . COLONOSCOPY  2013; 11/24/17   2013; Normal except diverticulosis (recall 10 yrs).  2018 (for GI bleeding)--severe diverticular dz + 2 adenomatous polyps.  Recall 5 yrs.  . DG CHEST AP OR PA ([facility] HX)  06/03/2020   Bibasilar atelectasis. No focal consoliadation definitively identified.  . dilation for GERD    . ESOPHAGOGASTRODUODENOSCOPY  11/24/2017   gastric polyps x 2, otherwise normal.  . ESOPHAGOGASTRODUODENOSCOPY N/A 06/14/2020   +distal esophagitis, h pylori neg, mild distal esoph stenosis widened with forceps, benign gastric polypsProcedure: ESOPHAGOGASTRODUODENOSCOPY (EGD);  Surgeon: Lavena Bullion, DO;  Location: [facility];  Service: Gastroenterology;  Laterality: N/A;  Diagnostic EGD as patietn last took Plavix 11am on 06/12/2020  . KIDNEY TRANSPLANT  11/06/12   [place] (cadaveric)  . PROSTATE BIOPSY  2011   no malignancy  . TRANSTHORACIC ECHOCARDIOGRAM  11/2016   Normal LV systolic fxn, EF 39-03%.  Grade I DD.  Mild aortic root dilatation, mild MV regurg.    Family History  Problem Relation Age of Onset  . Ulcers Mother        GI bleed   . Heart disease Father   . Bladder Cancer Father   . Colon cancer Neg Hx   . Esophageal cancer Neg Hx   . Pancreatic cancer Neg Hx   . Rectal cancer Neg Hx   . Stomach cancer Neg Hx     Social History   Socioeconomic History  . Marital status: Married    Spouse name: Not on file  . Number of children: Not on file  . Years of education: Not on file  . Highest education level: Not on file  Occupational History  . Not on file  Tobacco Use  . Smoking status: Former Smoker    Years: 16.00    Types: Pipe, Cigars    Quit date: 05/11/1974    Years since  quitting: 46.7  . Smokeless tobacco: Never Used  Vaping Use  . Vaping Use: Never used  Substance and Sexual Activity  . Alcohol use: No  . Drug use: No  . Sexual activity: Not Currently    Partners: Female  Other Topics Concern  . Not on file  Social History Narrative   Married, 4 children, 9 GGC, [place].   Occupation: retired Marine scientist.  Originally from [place].   Tobacco: quit 1975; smoked pipes and cigars x 15 yrs prior to this.   Alcohol: none.   Exercise: minimal, but is going to sign up for silver sneakers at the [facility].   Social Determinants of Health   Financial Resource Strain: Not on file  Food Insecurity: Not on file  Transportation Needs: No Transportation Needs  . Lack of Transportation (Medical): No  . Lack of Transportation (Non-Medical): No  Physical Activity: Not on file  Stress: Not on file  Social Connections: Not on file  Intimate Partner Violence: Not on  file    Outpatient Medications Prior to Visit  Medication Sig Dispense Refill  . allopurinol (ZYLOPRIM) 300 MG tablet Take 1 tablet by mouth once daily 90 tablet 0  . carvedilol (COREG) 12.5 MG tablet TAKE 1/2 (ONE-HALF) TABLET BY MOUTH IN THE MORNING AND 1 IN THE EVENING (Patient taking differently: TAKE 1/2 (ONE-HALF) TABLET BY MOUTH IN THE MORNING AND 1/2 IN THE EVENING) 135 tablet 1  . clopidogrel (PLAVIX) 75 MG tablet Take 1 tablet by mouth once daily 90 tablet 0  . EUTHYROX 25 MCG tablet Take 1 tablet by mouth once daily 90 tablet 0  . hydrOXYzine (ATARAX/VISTARIL) 10 MG tablet Take 10 mg by mouth 2 (two) times daily as needed for itching or anxiety.     . Melatonin 2.5 MG CAPS Take 2 capsules by mouth at bedtime as needed (sleep).     . mycophenolate (MYFORTIC) 180 MG EC tablet Take 360 mg by mouth 2 (two) times daily.     . Omega-3 Fatty Acids (FISH OIL PO) Take by mouth daily.    . pantoprazole (PROTONIX) 40 MG tablet Take 1 tablet by mouth twice daily (Patient taking differently: Take 40 mg by  mouth daily.) 180 tablet 0  . risperiDONE (RISPERDAL) 1 MG tablet Take 1 mg by mouth at bedtime.     . simvastatin (ZOCOR) 20 MG tablet Take 1 tablet by mouth once daily 90 tablet 1  . sulfamethoxazole-trimethoprim (BACTRIM,SEPTRA) 400-80 MG tablet Take 1 tablet by mouth every Monday, Wednesday, and Friday.    . tacrolimus (PROGRAF) 0.5 MG capsule See admin instructions. 2 in the am and 1 at night    . acetaminophen (TYLENOL) 325 MG tablet Take 650 mg by mouth every 6 (six) hours as needed for mild pain. (Patient not taking: Reported on 01/21/2021)    . benzonatate (TESSALON) 200 MG capsule Take 1 capsule (200 mg total) by mouth 3 (three) times daily as needed for cough. (Patient not taking: Reported on 01/21/2021) 30 capsule 2   No facility-administered medications prior to visit.    Allergies  Allergen Reactions  . Amantadine Hcl Itching    REACTION: itching  . Chlorpromazine Hcl     REACTION: "fell flat on face"    ROS Review of Systems  Constitutional: Negative for appetite change, chills, fatigue and fever.  HENT: Negative for congestion, dental problem, ear pain and sore throat.   Eyes: Negative for discharge, redness and visual disturbance.  Respiratory: Negative for cough, chest tightness, shortness of breath and wheezing.   Cardiovascular: Negative for chest pain, palpitations and leg swelling.  Gastrointestinal: Negative for abdominal pain, blood in stool, diarrhea, nausea and vomiting.  Genitourinary: Negative for difficulty urinating, dysuria, flank pain, frequency, hematuria and urgency.  Musculoskeletal: Negative for arthralgias, back pain, joint swelling, myalgias and neck stiffness.  Skin: Negative for pallor and rash.  Neurological: Positive for light-headedness (as per hpi). Negative for dizziness, speech difficulty, weakness and headaches.  Hematological: Negative for adenopathy. Does not bruise/bleed easily.  Psychiatric/Behavioral: Negative for confusion and sleep  disturbance. The patient is not nervous/anxious.     PE; Vitals with BMI 01/21/2021 10/09/2020 07/22/2020  Height 5' 9.25" 5\' 9"  5\' 9"   Weight 182 lbs 6 oz 180 lbs 179 lbs 3 oz  BMI 26.74 91.47 82.95  Systolic 621 308 657  Diastolic 58 69 67  Pulse 74 73 81   Gen: Alert, well appearing.  Patient is oriented to person, place, time, and situation. AFFECT: pleasant, lucid thought  and speech. ENT: Ears: EACs clear, normal epithelium.  TMs with good light reflex and landmarks bilaterally.  Eyes: no injection, icteris, swelling, or exudate.  EOMI, PERRLA. Nose: no drainage or turbinate edema/swelling.  No injection or focal lesion.  Mouth: lips without lesion/swelling.  Oral mucosa pink and moist.  Dentition intact and without obvious caries or gingival swelling.  Oropharynx without erythema, exudate, or swelling.  Neck: supple/nontender.  No LAD, mass, or TM.  Carotid pulses 2+ bilaterally, without bruits. CV: RRR, no m/r/g.  To and fro murmur from his AV fistula is audible. LUNGS: CTA bilat, nonlabored resps, good aeration in all lung fields. ABD: soft, NT, ND, BS normal.  No hepatospenomegaly or mass.  No bruits. EXT: no clubbing, cyanosis, or edema.  L upper arm has large tortuous aneurism of AV fistula w/palpable thrill, no tenderness or erythema. Musculoskeletal: no joint swelling, erythema, warmth, or tenderness.  ROM of all joints intact. Skin - no sores or suspicious lesions or rashes or color changes Neuro: CN 2-12 intact bilaterally, strength 5/5 in proximal and distal upper extremities and lower extremities bilaterally.  No sensory deficits.  No tremor.    No ataxia.    Pertinent labs:  Lab Results  Component Value Date   TSH 0.42 07/22/2020   Lab Results  Component Value Date   WBC 4.3 11/28/2020   HGB 14.1 11/28/2020   HCT 43 11/28/2020   MCV 89.3 06/28/2020   PLT 181 11/28/2020   Lab Results  Component Value Date   CREATININE 1.9 (A) 11/28/2020   BUN 23 (A) 11/28/2020    NA 139 11/28/2020   K 4.5 11/28/2020   CL 106 11/28/2020   CO2 26 (A) 11/28/2020   Lab Results  Component Value Date   ALT 7 (A) 11/28/2020   AST 10 (A) 11/28/2020   ALKPHOS 68 11/28/2020   BILITOT 0.7 06/28/2020   Lab Results  Component Value Date   CHOL 126 07/22/2020   Lab Results  Component Value Date   HDL 37.20 (L) 07/22/2020   Lab Results  Component Value Date   LDLCALC 65 07/22/2020   Lab Results  Component Value Date   TRIG 120.0 07/22/2020   Lab Results  Component Value Date   CHOLHDL 3 07/22/2020   Lab Results  Component Value Date   PSA 4.2 (H) 11/05/2017   PSA 3.51 03/28/2008   PSA 4.83 (H) 12/02/2007   Lab Results  Component Value Date   HGBA1C 5.9 (H) 01/23/2020   ASSESSMENT AND PLAN:   1) HTN: well controlled, continue coreg 12.5mg  1/2 bid. Lytes/cr today.  2) HLD: tolerating simvastatin 20mg  qd. FLP and hepatic panel today, goal LDL <70 (hx of CVA).  3) CRI III, hx of renal transplant: getting approp nephrol f/u. Lytes/cr today. L arm aneurismal AV fistula being monitored by pt and CV surgeon, Dr. Scot Dock. No intervention/procedure recommended as of 09/2020 f/u with Dr. Scot Dock.  4) Dizziness, nonspecific: suspect this is residual deficit from his CVA 2017. Pt finds it tolerable, no falls or presyncope.  5) Health maintenance exam: Reviewed age and gender appropriate health maintenance issues (prudent diet, regular exercise, health risks of tobacco and excessive alcohol, use of seatbelts, fire alarms in home, use of sunscreen).  Also reviewed age and gender appropriate health screening as well as vaccine recommendations. Vaccines: Covid 19->UTD.  Flu->UTD. Labs: CMET, FLP, A1c, TSH. Prostate ca screening: hx of BPH w/ elev PSAs, was followed by urol but not last few years->he  elects to STOP PSA MEASUREMENTS AT THIS TIME. Colon ca screening: hx of adenomatous polyps->next colonoscopy would be due 10/2022 if pt chooses to pursue further  screening.  An After Visit Summary was printed and given to the patient.  FOLLOW UP:  Return in about 4 months (around 05/21/2021) for routine chronic illness f/u.  Signed:  Crissie Sickles, MD           01/21/2021

## 2021-01-22 LAB — COMPREHENSIVE METABOLIC PANEL
AG Ratio: 1.8 (calc) (ref 1.0–2.5)
ALT: 8 U/L — ABNORMAL LOW (ref 9–46)
AST: 12 U/L (ref 10–35)
Albumin: 3.8 g/dL (ref 3.6–5.1)
Alkaline phosphatase (APISO): 57 U/L (ref 35–144)
BUN/Creatinine Ratio: 17 (calc) (ref 6–22)
BUN: 27 mg/dL — ABNORMAL HIGH (ref 7–25)
CO2: 22 mmol/L (ref 20–32)
Calcium: 9.4 mg/dL (ref 8.6–10.3)
Chloride: 106 mmol/L (ref 98–110)
Creat: 1.63 mg/dL — ABNORMAL HIGH (ref 0.70–1.11)
Globulin: 2.1 g/dL (calc) (ref 1.9–3.7)
Glucose, Bld: 95 mg/dL (ref 65–99)
Potassium: 4.5 mmol/L (ref 3.5–5.3)
Sodium: 142 mmol/L (ref 135–146)
Total Bilirubin: 0.8 mg/dL (ref 0.2–1.2)
Total Protein: 5.9 g/dL — ABNORMAL LOW (ref 6.1–8.1)

## 2021-01-22 LAB — LIPID PANEL
Cholesterol: 132 mg/dL (ref <200)
HDL: 44 mg/dL (ref >=40)
LDL Cholesterol (Calc): 72 mg/dL (calc)
Non-HDL Cholesterol (Calc): 88 mg/dL (calc) (ref <130)
Total CHOL/HDL Ratio: 3 (calc) (ref <5.0)
Triglycerides: 84 mg/dL (ref <150)

## 2021-01-22 LAB — HEMOGLOBIN A1C
Hgb A1c MFr Bld: 5.7 % of total Hgb — ABNORMAL HIGH (ref <5.7)
Mean Plasma Glucose: 117 mg/dL
eAG (mmol/L): 6.5 mmol/L

## 2021-01-22 LAB — TSH: TSH: 0.55 mIU/L (ref 0.40–4.50)

## 2021-02-05 DIAGNOSIS — Z79899 Other long term (current) drug therapy: Secondary | ICD-10-CM | POA: Diagnosis not present

## 2021-02-05 DIAGNOSIS — K2100 Gastro-esophageal reflux disease with esophagitis, without bleeding: Secondary | ICD-10-CM | POA: Diagnosis not present

## 2021-02-05 DIAGNOSIS — Z8673 Personal history of transient ischemic attack (TIA), and cerebral infarction without residual deficits: Secondary | ICD-10-CM | POA: Diagnosis not present

## 2021-02-05 DIAGNOSIS — Z8639 Personal history of other endocrine, nutritional and metabolic disease: Secondary | ICD-10-CM | POA: Diagnosis not present

## 2021-02-05 DIAGNOSIS — Z7902 Long term (current) use of antithrombotics/antiplatelets: Secondary | ICD-10-CM | POA: Diagnosis not present

## 2021-02-05 DIAGNOSIS — K222 Esophageal obstruction: Secondary | ICD-10-CM | POA: Diagnosis not present

## 2021-02-05 DIAGNOSIS — Z8719 Personal history of other diseases of the digestive system: Secondary | ICD-10-CM | POA: Diagnosis not present

## 2021-02-05 DIAGNOSIS — Z4822 Encounter for aftercare following kidney transplant: Secondary | ICD-10-CM | POA: Diagnosis not present

## 2021-02-05 DIAGNOSIS — E039 Hypothyroidism, unspecified: Secondary | ICD-10-CM | POA: Diagnosis not present

## 2021-02-05 DIAGNOSIS — R69 Illness, unspecified: Secondary | ICD-10-CM | POA: Diagnosis not present

## 2021-02-05 DIAGNOSIS — E785 Hyperlipidemia, unspecified: Secondary | ICD-10-CM | POA: Diagnosis not present

## 2021-02-05 DIAGNOSIS — Z940 Kidney transplant status: Secondary | ICD-10-CM | POA: Diagnosis not present

## 2021-02-05 DIAGNOSIS — I10 Essential (primary) hypertension: Secondary | ICD-10-CM | POA: Diagnosis not present

## 2021-02-06 ENCOUNTER — Other Ambulatory Visit: Payer: Self-pay | Admitting: Family Medicine

## 2021-02-14 IMAGING — CT CT ABD-PELV W/O CM
3 of 7 series · 14 of 36 positions shown, 17 images · non-contrast
Comparison: September 26, 2019

CLINICAL DATA: Nausea vomiting weight loss over the last 3 weeks

EXAM:
CT CHEST WITHOUT CONTRAST
TECHNIQUE: Multidetector CT imaging of the chest was performed following the
standard protocol without IV contrast.

[Series 2: cap w/o · axial · non-contrast · 0.84mm/px · z∈[-650,-100]mm · 10 of 136 slices shown, 13 images]
[im 13/136  mediastinal]
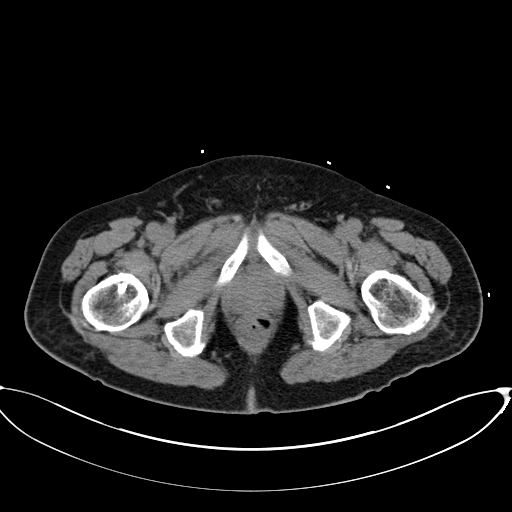
[im 13/136  lung]
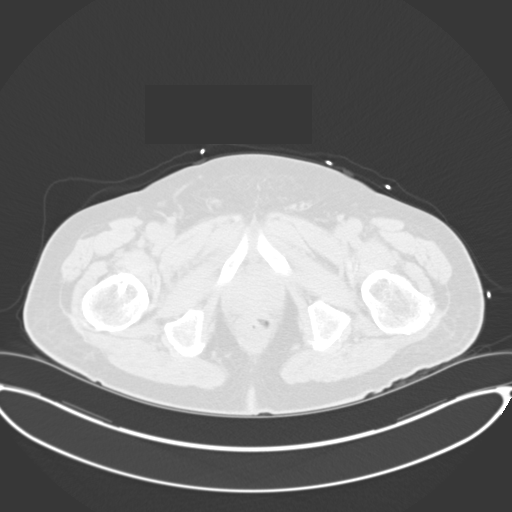
[im 25/136  lung]
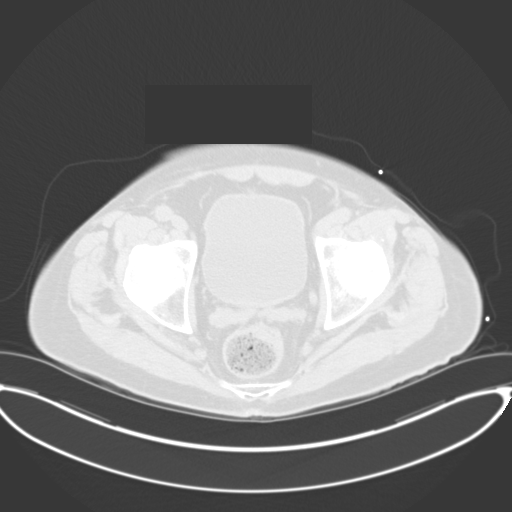
[im 37/136  lung]
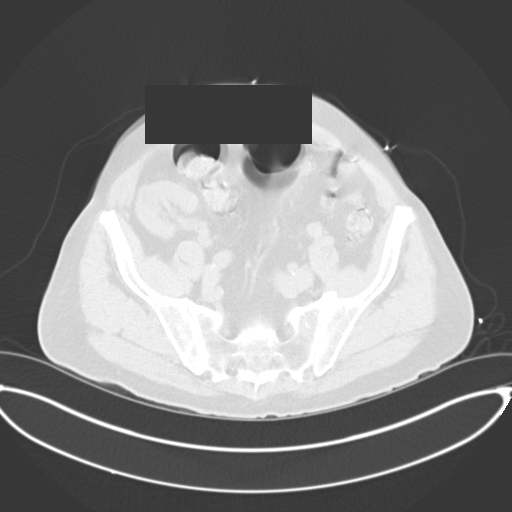
[im 50/136  lung]
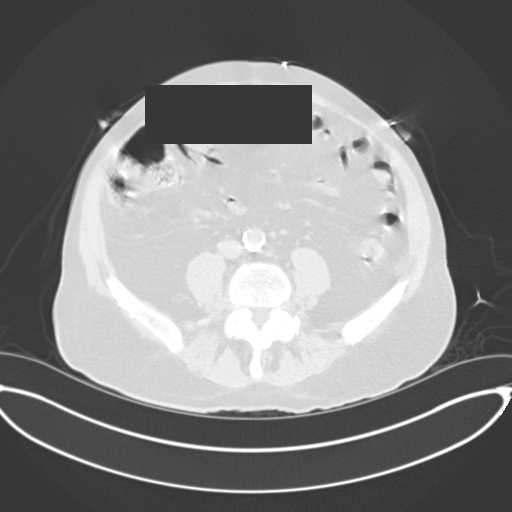
[im 62/136  mediastinal]
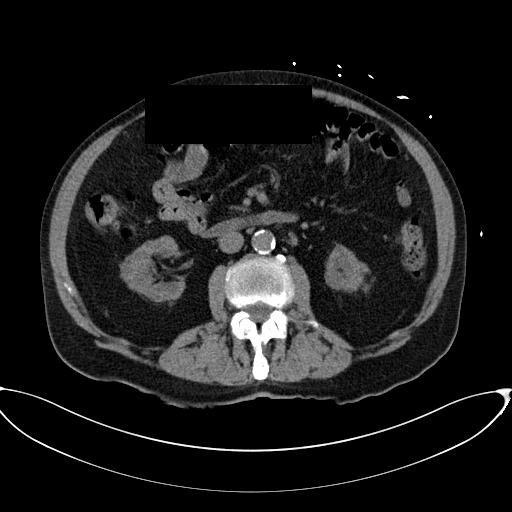
[im 62/136  lung]
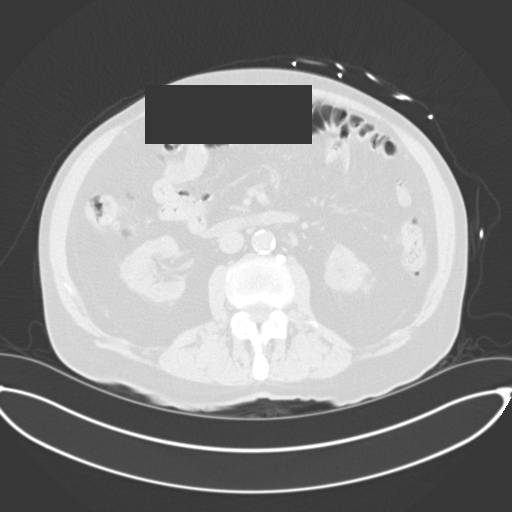
[im 74/136  lung]
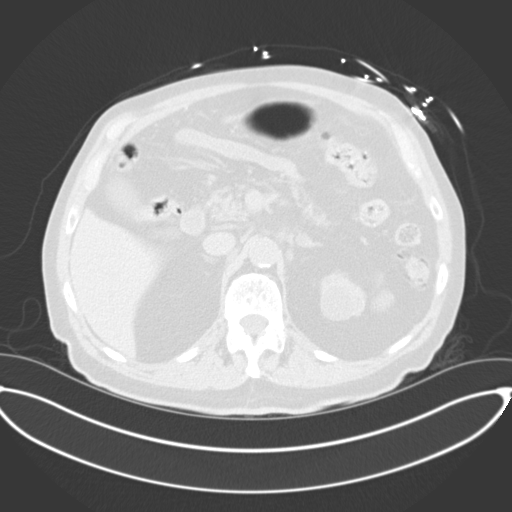
[im 86/136  lung]
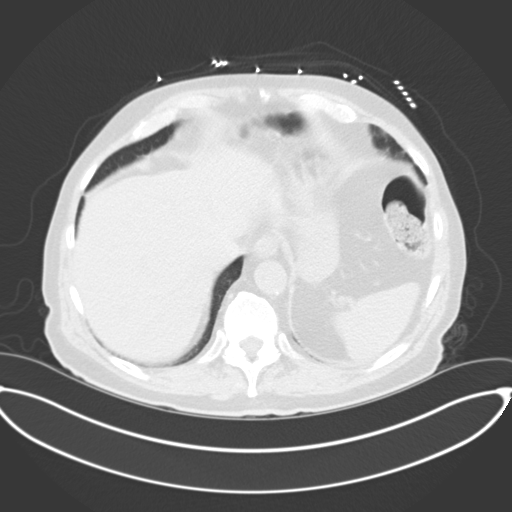
[im 99/136  lung]
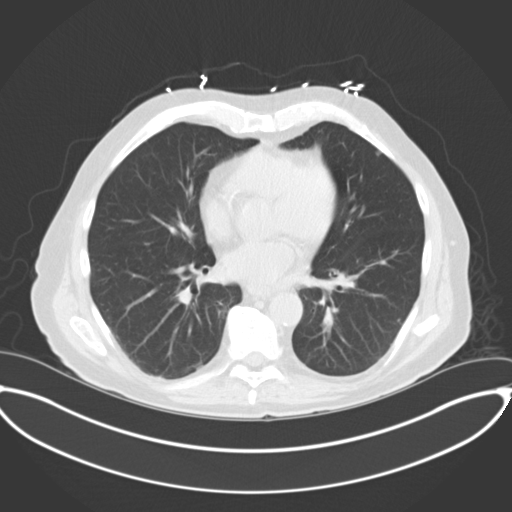
[im 111/136  mediastinal]
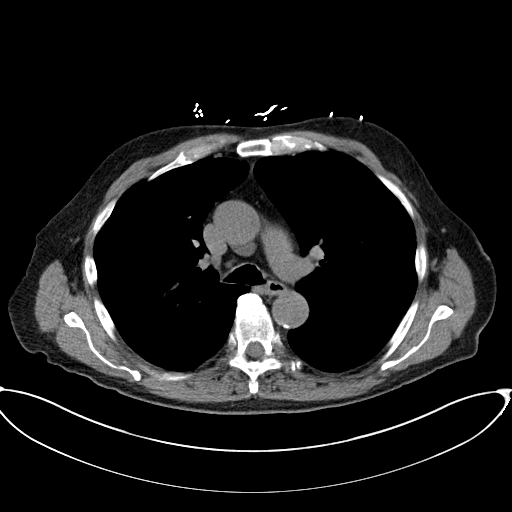
[im 111/136  lung]
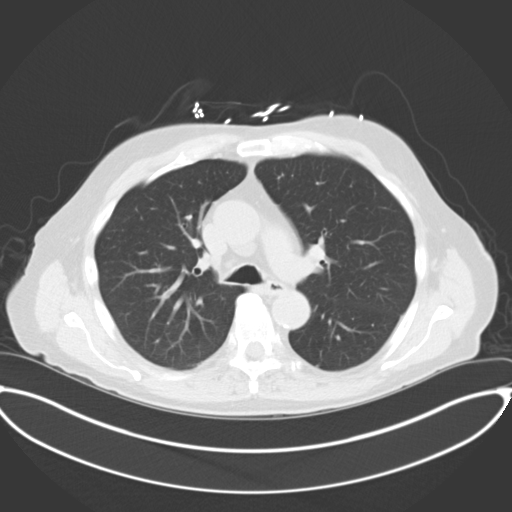
[im 123/136  lung]
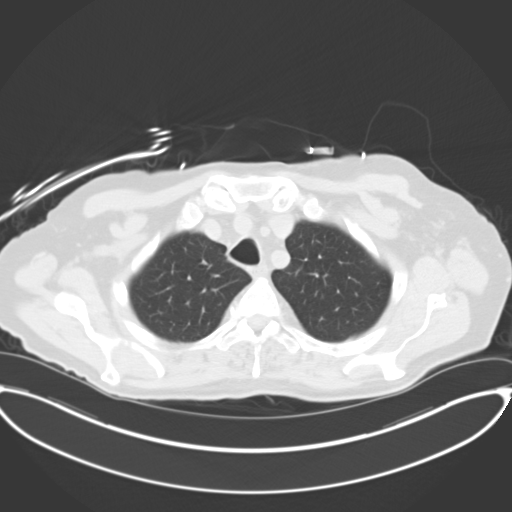

[Series 3: coronals · coronal · 0.74mm/px · 1 of 152 slices shown]
[im 76/152  lung]
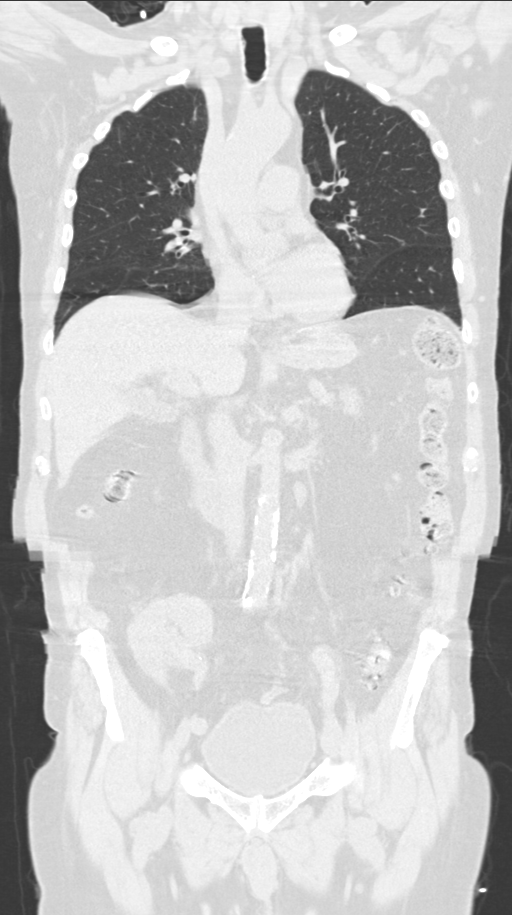

[Series 6: lung · axial · 0.84mm/px · z∈[-305,-225]mm · 3 of 149 slices shown]
[im 14/149  lung]
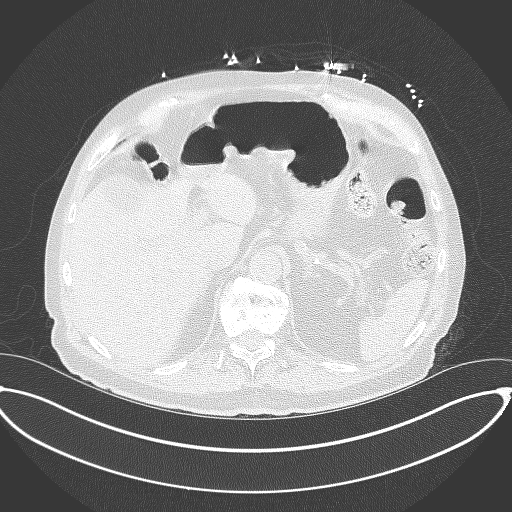
[im 27/149  lung]
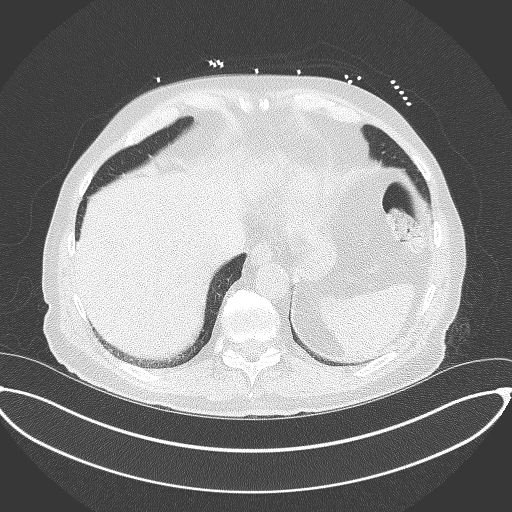
[im 54/149  lung]
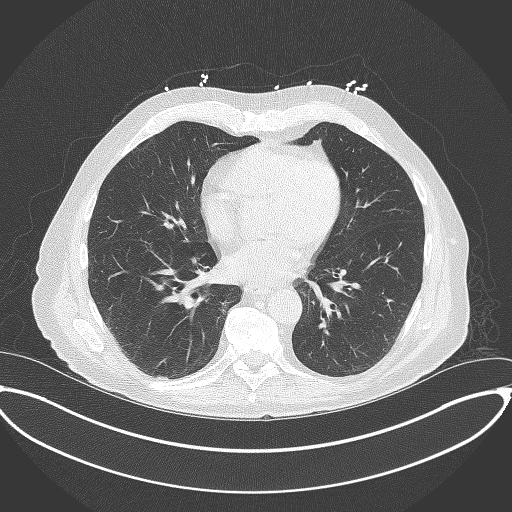

[14 of 36 positions shown; findings below may reference images not displayed]

FINDINGS: Cardiovascular: Coronary artery calcifications are seen. There is
scattered aortic atherosclerosis noted. The heart size is normal.
There is no pericardial thickening or effusion.

Mediastinum/Nodes: No definite enlarged mediastinal adenopathy,
however limited due to the lack of intravenous contrast. The thyroid
gland, trachea and esophagus demonstrate no significant findings.

Lungs/Pleura: Again noted is minimal ground-glass opacity at the
posterior right lung base, likely atelectasis or scarring. No
suspicious pulmonary nodules. There is no pleural effusion.

Upper abdomen: The visualized portion of the upper abdomen is
unremarkable.

Musculoskeletal/Chest wall: There is no chest wall mass or
suspicious osseous finding. No acute osseous abnormality

Abdomen/pelvis:

Hepatobiliary: Although limited due to the lack of intravenous
contrast, normal in appearance without gross focal abnormality. No
evidence of calcified gallstones or biliary ductal dilatation.

Pancreas:  Unremarkable.  No surrounding inflammatory changes.

Spleen: Normal in size. Although limited due to the lack of
intravenous contrast, normal in appearance.

Adrenals/Urinary Tract: Both adrenal glands appear normal. There is
renal atrophy of the bilateral native kidneys. Within the upper pole
of the right kidney there is a slightly hyperdense lesion measuring
3.7 cm. Throughout both kidneys are low-density lesions. A right
lower pelvic renal transplant is present. There is a 3 cm
low-density lesion within the renal transplant, likely renal cyst.
No hydronephrosis is seen. The bladder is unremarkable.

Stomach/Bowel: The stomach and small bowel are normal in appearance.
There is scattered colonic diverticula without diverticulitis. A
moderate to large amount of colonic stool is present.

Vascular/Lymphatic: There are no enlarged abdominal or pelvic lymph
nodes. Scattered aortic atherosclerotic calcifications are seen
without aneurysmal dilatation.

Reproductive: A heterogeneously enlarged prostate gland is noted
which protrudes into the posterior bladder.

Other: No evidence of abdominal wall mass or hernia.

Musculoskeletal: No acute or significant osseous findings.
Degenerative changes are seen throughout the thoracolumbar spine.
Anterior flowing osteophytes are seen in the lower thoracic spine.
IMPRESSION: 1. No acute intrathoracic pathology to explain the patient's
symptoms.
2. Coronary artery calcifications.
3. Slightly hyperdense 3.7 cm lesion within the right native kidney
which could represent a proteinaceous or hemorrhagic cyst, however
cannot exclude a solid renal lesion. Would recommend renal
ultrasound for further evaluation.
4. Diverticulosis without diverticulitis.
5.  Aortic Atherosclerosis (X1GLL-KG3.3).
6. Prostatomegaly

## 2021-02-14 IMAGING — CT CT CHEST W/O CM
3 of 7 series · 14 of 36 positions shown, 17 images · non-contrast
Comparison: September 26, 2019

CLINICAL DATA: Nausea vomiting weight loss over the last 3 weeks

EXAM:
CT CHEST WITHOUT CONTRAST
TECHNIQUE: Multidetector CT imaging of the chest was performed following the
standard protocol without IV contrast.

[Series 2: cap w/o · axial · non-contrast · 0.84mm/px · z∈[-650,-100]mm · 10 of 136 slices shown, 13 images]
[im 13/136  mediastinal]
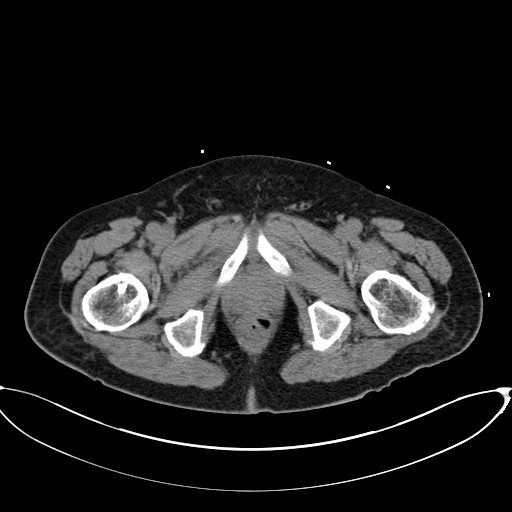
[im 13/136  lung]
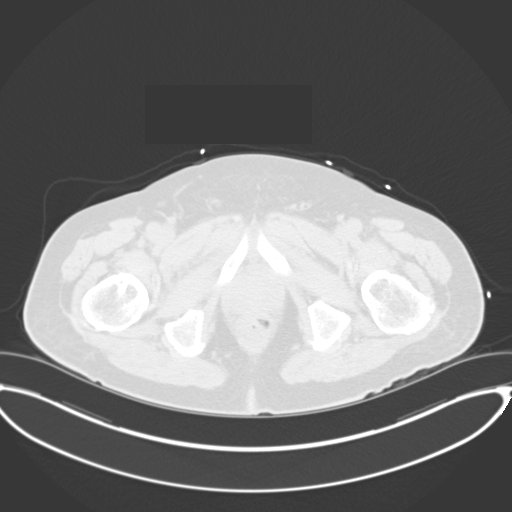
[im 25/136  lung]
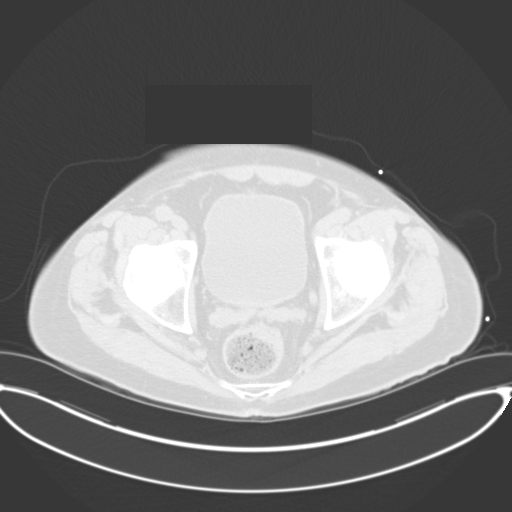
[im 37/136  lung]
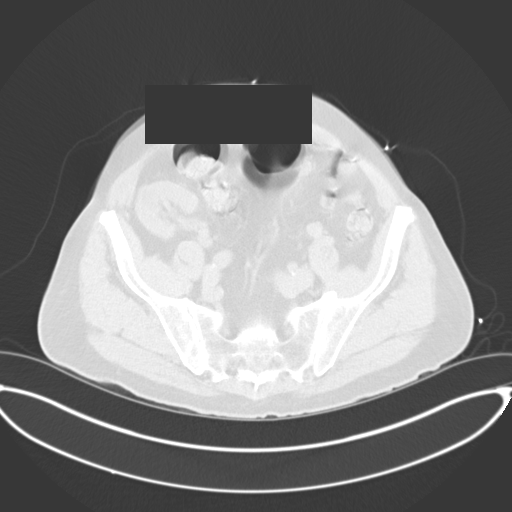
[im 50/136  lung]
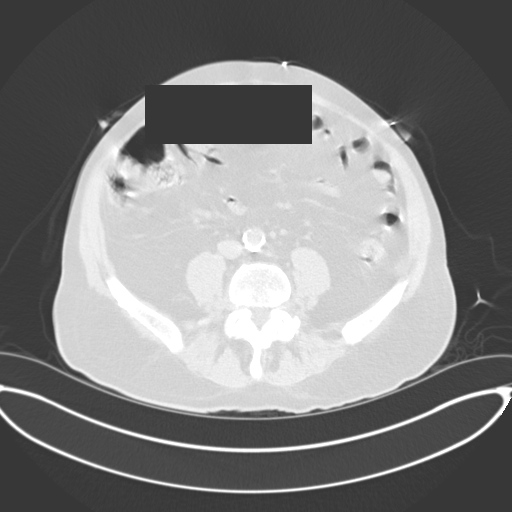
[im 62/136  mediastinal]
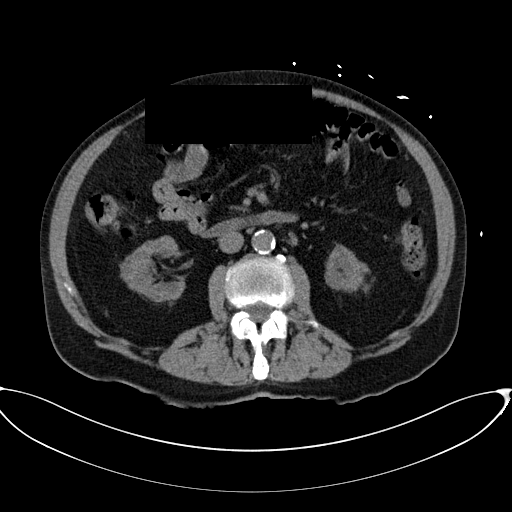
[im 62/136  lung]
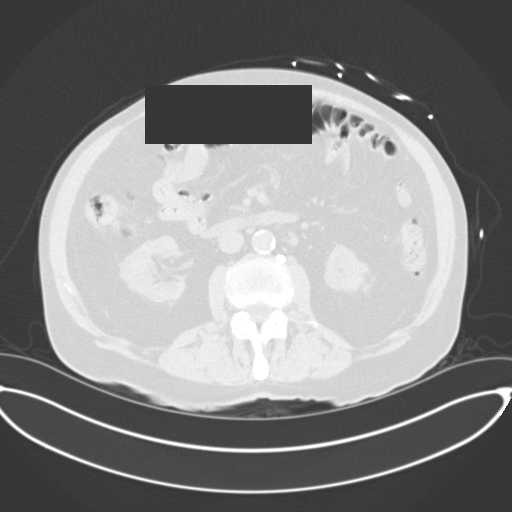
[im 74/136  lung]
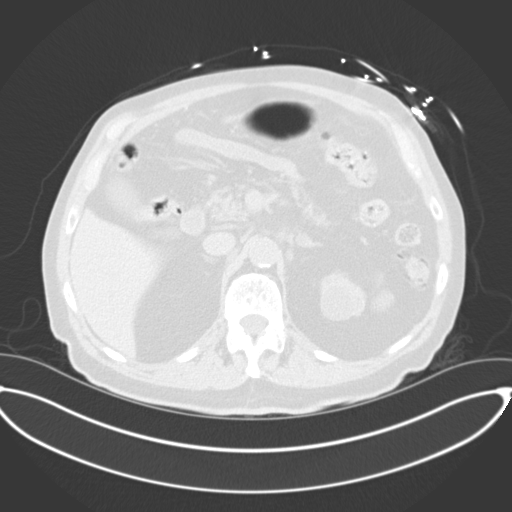
[im 86/136  lung]
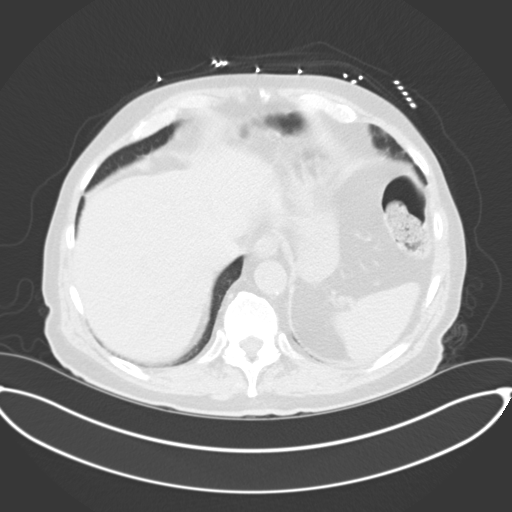
[im 99/136  lung]
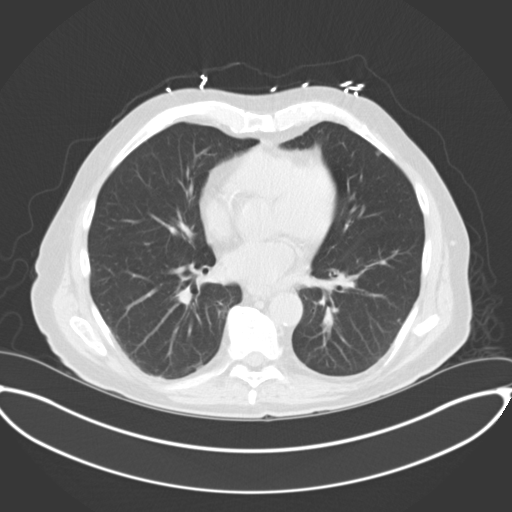
[im 111/136  mediastinal]
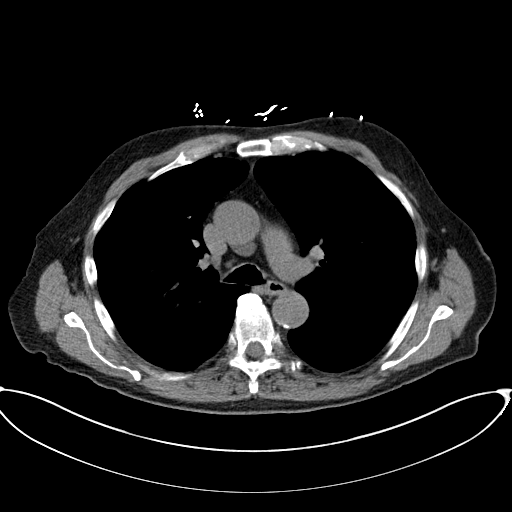
[im 111/136  lung]
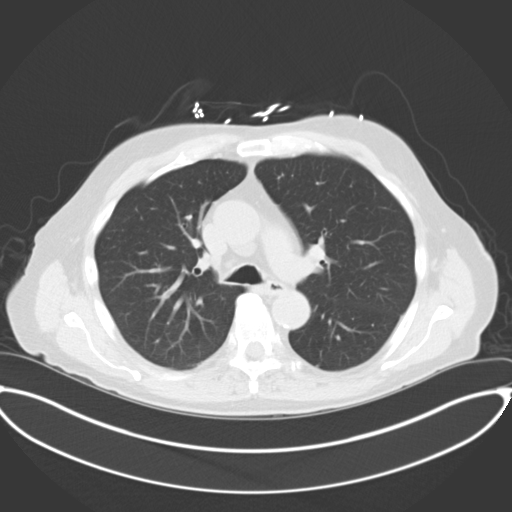
[im 123/136  lung]
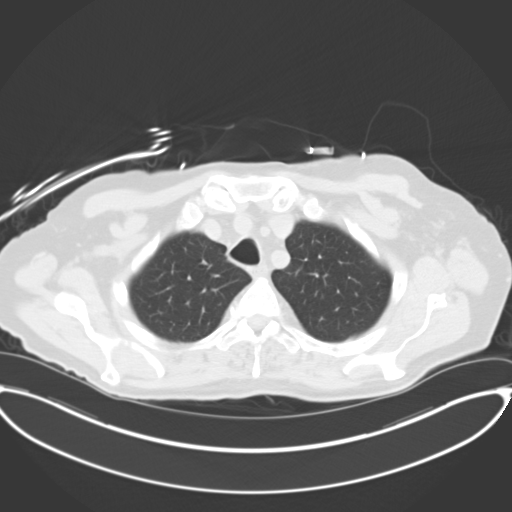

[Series 3: coronals · coronal · 0.74mm/px · 1 of 152 slices shown]
[im 76/152  lung]
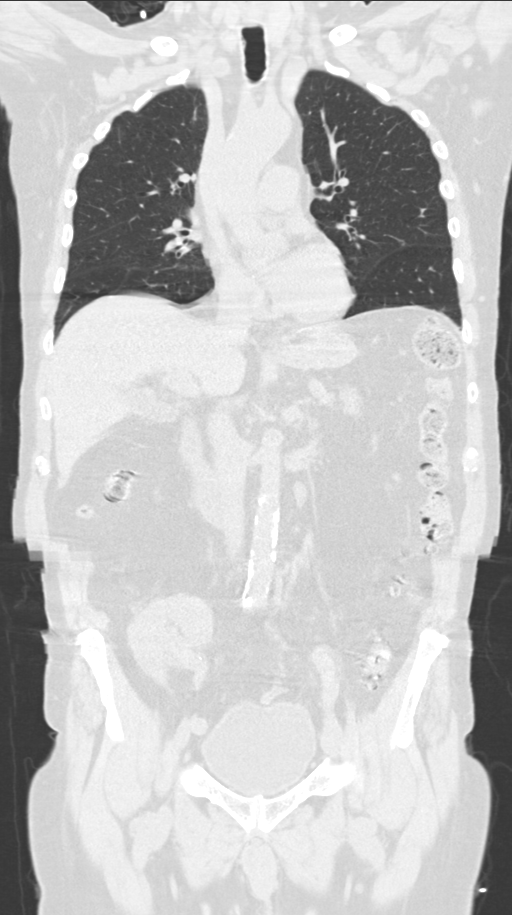

[Series 6: lung · axial · 0.84mm/px · z∈[-305,-225]mm · 3 of 149 slices shown]
[im 14/149  lung]
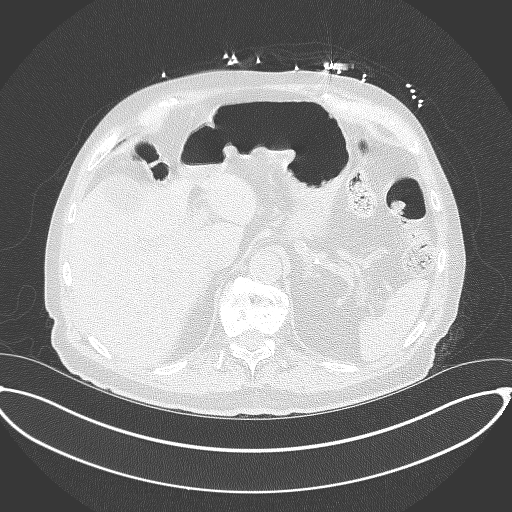
[im 27/149  lung]
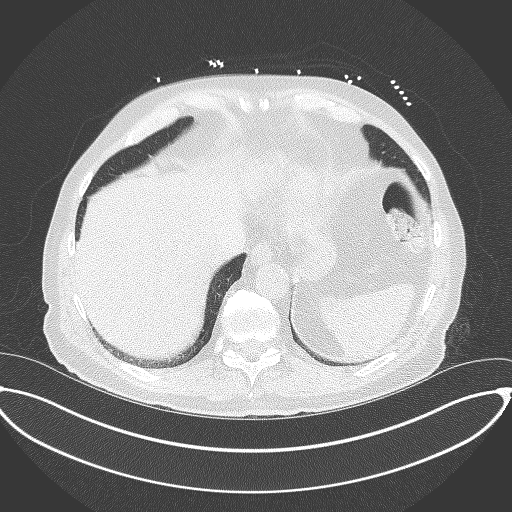
[im 54/149  lung]
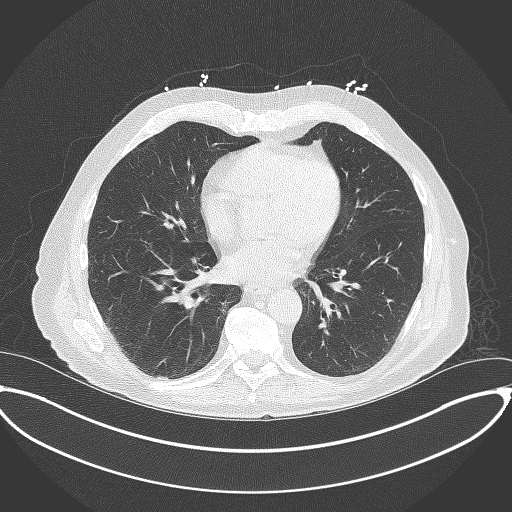

[14 of 36 positions shown; findings below may reference images not displayed]

FINDINGS: Cardiovascular: Coronary artery calcifications are seen. There is
scattered aortic atherosclerosis noted. The heart size is normal.
There is no pericardial thickening or effusion.

Mediastinum/Nodes: No definite enlarged mediastinal adenopathy,
however limited due to the lack of intravenous contrast. The thyroid
gland, trachea and esophagus demonstrate no significant findings.

Lungs/Pleura: Again noted is minimal ground-glass opacity at the
posterior right lung base, likely atelectasis or scarring. No
suspicious pulmonary nodules. There is no pleural effusion.

Upper abdomen: The visualized portion of the upper abdomen is
unremarkable.

Musculoskeletal/Chest wall: There is no chest wall mass or
suspicious osseous finding. No acute osseous abnormality

Abdomen/pelvis:

Hepatobiliary: Although limited due to the lack of intravenous
contrast, normal in appearance without gross focal abnormality. No
evidence of calcified gallstones or biliary ductal dilatation.

Pancreas:  Unremarkable.  No surrounding inflammatory changes.

Spleen: Normal in size. Although limited due to the lack of
intravenous contrast, normal in appearance.

Adrenals/Urinary Tract: Both adrenal glands appear normal. There is
renal atrophy of the bilateral native kidneys. Within the upper pole
of the right kidney there is a slightly hyperdense lesion measuring
3.7 cm. Throughout both kidneys are low-density lesions. A right
lower pelvic renal transplant is present. There is a 3 cm
low-density lesion within the renal transplant, likely renal cyst.
No hydronephrosis is seen. The bladder is unremarkable.

Stomach/Bowel: The stomach and small bowel are normal in appearance.
There is scattered colonic diverticula without diverticulitis. A
moderate to large amount of colonic stool is present.

Vascular/Lymphatic: There are no enlarged abdominal or pelvic lymph
nodes. Scattered aortic atherosclerotic calcifications are seen
without aneurysmal dilatation.

Reproductive: A heterogeneously enlarged prostate gland is noted
which protrudes into the posterior bladder.

Other: No evidence of abdominal wall mass or hernia.

Musculoskeletal: No acute or significant osseous findings.
Degenerative changes are seen throughout the thoracolumbar spine.
Anterior flowing osteophytes are seen in the lower thoracic spine.
IMPRESSION: 1. No acute intrathoracic pathology to explain the patient's
symptoms.
2. Coronary artery calcifications.
3. Slightly hyperdense 3.7 cm lesion within the right native kidney
which could represent a proteinaceous or hemorrhagic cyst, however
cannot exclude a solid renal lesion. Would recommend renal
ultrasound for further evaluation.
4. Diverticulosis without diverticulitis.
5.  Aortic Atherosclerosis (X1GLL-KG3.3).
6. Prostatomegaly

## 2021-02-19 ENCOUNTER — Other Ambulatory Visit: Payer: Self-pay | Admitting: Family Medicine

## 2021-02-28 ENCOUNTER — Other Ambulatory Visit: Payer: Self-pay | Admitting: Family Medicine

## 2021-03-07 DIAGNOSIS — Z940 Kidney transplant status: Secondary | ICD-10-CM | POA: Diagnosis not present

## 2021-03-13 DIAGNOSIS — M109 Gout, unspecified: Secondary | ICD-10-CM | POA: Diagnosis not present

## 2021-03-13 DIAGNOSIS — K922 Gastrointestinal hemorrhage, unspecified: Secondary | ICD-10-CM | POA: Diagnosis not present

## 2021-03-13 DIAGNOSIS — R739 Hyperglycemia, unspecified: Secondary | ICD-10-CM | POA: Diagnosis not present

## 2021-03-13 DIAGNOSIS — I129 Hypertensive chronic kidney disease with stage 1 through stage 4 chronic kidney disease, or unspecified chronic kidney disease: Secondary | ICD-10-CM | POA: Diagnosis not present

## 2021-03-13 DIAGNOSIS — Z940 Kidney transplant status: Secondary | ICD-10-CM | POA: Diagnosis not present

## 2021-03-13 DIAGNOSIS — E669 Obesity, unspecified: Secondary | ICD-10-CM | POA: Diagnosis not present

## 2021-03-13 DIAGNOSIS — R0902 Hypoxemia: Secondary | ICD-10-CM | POA: Diagnosis not present

## 2021-03-13 DIAGNOSIS — M7989 Other specified soft tissue disorders: Secondary | ICD-10-CM | POA: Diagnosis not present

## 2021-03-26 ENCOUNTER — Encounter: Payer: Self-pay | Admitting: Family Medicine

## 2021-03-26 NOTE — Telephone Encounter (Signed)
Please advise, thanks.

## 2021-05-11 ENCOUNTER — Other Ambulatory Visit: Payer: Self-pay | Admitting: Physician Assistant

## 2021-05-18 ENCOUNTER — Other Ambulatory Visit: Payer: Self-pay | Admitting: Family Medicine

## 2021-05-20 ENCOUNTER — Ambulatory Visit (INDEPENDENT_AMBULATORY_CARE_PROVIDER_SITE_OTHER): Payer: Medicare HMO | Admitting: Family Medicine

## 2021-05-20 ENCOUNTER — Encounter: Payer: Self-pay | Admitting: Family Medicine

## 2021-05-20 ENCOUNTER — Other Ambulatory Visit: Payer: Self-pay

## 2021-05-20 VITALS — BP 114/69 | HR 67 | Temp 97.5°F | Ht 69.25 in | Wt 185.2 lb

## 2021-05-20 DIAGNOSIS — Z940 Kidney transplant status: Secondary | ICD-10-CM | POA: Diagnosis not present

## 2021-05-20 DIAGNOSIS — I10 Essential (primary) hypertension: Secondary | ICD-10-CM | POA: Diagnosis not present

## 2021-05-20 DIAGNOSIS — N2889 Other specified disorders of kidney and ureter: Secondary | ICD-10-CM

## 2021-05-20 DIAGNOSIS — Z23 Encounter for immunization: Secondary | ICD-10-CM

## 2021-05-20 DIAGNOSIS — N183 Chronic kidney disease, stage 3 unspecified: Secondary | ICD-10-CM

## 2021-05-20 DIAGNOSIS — E039 Hypothyroidism, unspecified: Secondary | ICD-10-CM | POA: Diagnosis not present

## 2021-05-20 DIAGNOSIS — E7800 Pure hypercholesterolemia, unspecified: Secondary | ICD-10-CM

## 2021-05-20 LAB — BASIC METABOLIC PANEL
BUN: 29 mg/dL — ABNORMAL HIGH (ref 6–23)
CO2: 24 mEq/L (ref 19–32)
Calcium: 9.3 mg/dL (ref 8.4–10.5)
Chloride: 106 mEq/L (ref 96–112)
Creatinine, Ser: 1.62 mg/dL — ABNORMAL HIGH (ref 0.40–1.50)
GFR: 39.3 mL/min — ABNORMAL LOW (ref >60.00)
Glucose, Bld: 94 mg/dL (ref 70–99)
Potassium: 4.6 mEq/L (ref 3.5–5.1)
Sodium: 141 mEq/L (ref 135–145)

## 2021-05-20 LAB — TSH: TSH: 0.64 u[IU]/mL (ref 0.35–4.50)

## 2021-05-20 MED ORDER — PANTOPRAZOLE SODIUM 40 MG PO TBEC: DELAYED_RELEASE_TABLET | ORAL | 3 refills | Status: DC

## 2021-05-20 MED ORDER — SHINGRIX 50 MCG/0.5ML IM SUSR
0.5000 mL | Freq: Once | INTRAMUSCULAR | 0 refills | Status: AC
Start: 2021-05-20 — End: 2021-05-20

## 2021-05-20 MED ORDER — CARVEDILOL 12.5 MG PO TABS: ORAL_TABLET | ORAL | 3 refills | Status: DC

## 2021-05-20 NOTE — Progress Notes (Signed)
OFFICE VISIT  05/20/2021  CC:  Chief Complaint  Patient presents with  . Follow-up    HPI:    Patient is a 85 y.o. Caucasian male who presents for 4 mo f/u HTN, CRI III in the setting of hx of renal transplant, HLD, hypothyroidism. A/P as of last visit: "1) HTN: well controlled, continue coreg 12.5mg  1/2 bid. Lytes/cr today.  2) HLD: tolerating simvastatin 20mg  qd. FLP and hepatic panel today, goal LDL <70 (hx of CVA).  3) CRI III, hx of renal transplant: getting approp nephrol f/u. Lytes/cr today. L arm aneurismal AV fistula being monitored by pt and CV surgeon, Dr. Scot Dock. No intervention/procedure recommended as of 09/2020 f/u with Dr. Scot Dock.  4) Dizziness, nonspecific: suspect this is residual deficit from his CVA 2017. Pt finds it tolerable, no falls or presyncope.  5) Health maintenance exam: Reviewed age and gender appropriate health maintenance issues (prudent diet, regular exercise, health risks of tobacco and excessive alcohol, use of seatbelts, fire alarms in home, use of sunscreen).  Also reviewed age and gender appropriate health screening as well as vaccine recommendations. Vaccines: Covid 19->UTD.  Flu->UTD. Labs: CMET, FLP, A1c, TSH. Prostate ca screening: hx of BPH w/ elev PSAs, was followed by urol but not last few years->he elects to STOP PSA MEASUREMENTS AT THIS TIME. Colon ca screening: hx of adenomatous polyps->next colonoscopy would be due 10/2022 if pt chooses to pursue further screening."  INTERIM HX: Doing fine. No acute complaints. Activity: walking some, puttering around house/yard.   HTN: compliant with coreg 1/2 12.5mg  bid.  Occ home bp check always normal. HLD: tolerating simva 20mg  qd. Hypoth: Takes 25 mcg T4 qd on empty stomach w/out any other meds. CRI: he avoids NSAIDs.  Says he is trying to work on better fluid intake.  ROS as above, plus--> no fevers, no CP, no SOB, no wheezing, no cough, no dizziness, no HAs, no rashes, no  melena/hematochezia.  No polyuria or polydipsia.  No myalgias or arthralgias.  No focal weakness, paresthesias, or tremors.  No acute vision or hearing abnormalities.  No dysuria or unusual/new urinary urgency or frequency.  No recent changes in lower legs. No n/v/d or abd pain.  No palpitations.    Past Medical History:  Diagnosis Date  . Basal cell carcinoma    neck (skin MD 2X per year)  . Bifascicular block 2021   ectopy, asymptomatic.  Cards->observe  . Bipolar disorder ([place])   . BPH (benign prostatic hyperplasia)    with elevated PSA; followed by Dr. Rosana Hoes at [facility]  . Chronic renal insufficiency, stage III (moderate) (HCC)    in pt w/hx of renal transplant.  Post-transplant sCr 1.4-1.6.  Last renal f/u 05/01/19->Cr 1.63, GFR 39 ml/min  . Coronary atherosclerosis    LM and 3 V dz noted on CT 08/2019 performed for eval of interstitial lung dz in the setting of mild DOE and mild hypoxia. Cards->no stress tesing indicated->primary prevention emphasized  . CVA (cerebral vascular accident) ([place]) 11/2016   Pontine (vertebrobasilar--imaging neg), TPA given.  Pt discharged on plavix.  Carotid dopplers ok, echocardiogram ok.  Residual deficit: vertigo but this improved greatly with therapy.  . DOE (dyspnea on exertion) 2020   DOE and ? hypoxia->CXR 08/29/19--->diffuse interstitial opacities-? acute inflammatory process->Dr. Melvyn Novas eval'd him and was underwhelmed by the CXR and exam->CT chest 09/26/19 "Very subtle areas of mild ground-glass attenuatio-nonspecific", mild air trapping. Per Dr. Melvyn Novas, no signif ILD, improving with inc ambulation (post-inflamm pulm fibrosis?)  .  Esophagitis 06/14/2020   EGD->+distal esophagitis, h pylori neg, mild distal esoph stenosis widened with forceps, benign gastric polyps.  . Gallbladder polyp 2015   Asymptomatic  . GERD (gastroesophageal reflux disease)   . Gout   . History of adenomatous polyp of colon 2018   Recall 5 yrs  . Hyperlipidemia   .  Hypertension   . Hypothyroidism   . Lumbar spondylosis   . Ptosis due to aging    Right  . Rectal bleeding 09/2017   Admitted for obs due to Hb drop.  GI consulted---obs recommended.  No transfusion required.  Plavix was d/c'd, ferrous sulfate recommended.  Outpt GI f/u--plavix restarted and upper endoscopy was normal and colonoscopy showed 2 polyps and severe diverticular dz. Iron d/c'd 04/24/19.  Marland Kitchen Renal cyst    per pt he got MRI kidneys [facility] 12/2020 and the images will be viewed by Dr. Clover Mealy  . Restless legs syndrome   . S/p cadaver renal transplant 2013   Secondary to HTN +lithium toxicity over 30 yrs caused kidney destruction [facility] transplant MDs)  . Seborrheic dermatitis    eyebrows worst: Hytone rx'd by Dr. Denna Haggard.  . 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 AV FISTULA PLACEMENT  04/08/2012   Procedure: ARTERIOVENOUS (AV) FISTULA CREATION;  Surgeon: Angelia Mould, MD;  Location: [facility] OR;  Service: Vascular;  Laterality: Left;  Creation of left brachial cephalic arteriovenous fistula  . BIOPSY  06/14/2020   Procedure: BIOPSY;  Surgeon: Lavena Bullion, DO;  Location: [facility] ENDOSCOPY;  Service: Gastroenterology;;  . CATARACT EXTRACTION, BILATERAL  05/2018  . COLONOSCOPY  2013; 11/24/17   2013; Normal except diverticulosis (recall 10 yrs).  2018 (for GI bleeding)--severe diverticular dz + 2 adenomatous polyps.  Recall 5 yrs.  . DG CHEST AP OR PA ([facility] HX)  06/03/2020   Bibasilar atelectasis. No focal consoliadation definitively identified.  . dilation for GERD    . ESOPHAGOGASTRODUODENOSCOPY  11/24/2017   gastric polyps x 2, otherwise normal.  . ESOPHAGOGASTRODUODENOSCOPY N/A 06/14/2020   +distal esophagitis, h pylori neg, mild distal esoph stenosis widened with forceps, benign gastric polypsProcedure:  ESOPHAGOGASTRODUODENOSCOPY (EGD);  Surgeon: Lavena Bullion, DO;  Location: [facility];  Service: Gastroenterology;  Laterality: N/A;  Diagnostic EGD as patietn last took Plavix 11am on 06/12/2020  . KIDNEY TRANSPLANT  11/06/12   [place] (cadaveric)  . PROSTATE BIOPSY  2011   no malignancy  . TRANSTHORACIC ECHOCARDIOGRAM  11/2016   Normal LV systolic fxn, EF 77-41%.  Grade I DD.  Mild aortic root dilatation, mild MV regurg.    Outpatient Medications Prior to Visit  Medication Sig Dispense Refill  . acetaminophen (TYLENOL) 325 MG tablet Take 650 mg by mouth every 6 (six) hours as needed for mild pain.    Marland Kitchen allopurinol (ZYLOPRIM) 300 MG tablet Take 1 tablet by mouth once daily 90 tablet 0  . clopidogrel (PLAVIX) 75 MG tablet Take 1 tablet by mouth once daily 90 tablet 1  . EUTHYROX 25 MCG tablet Take 1 tablet by mouth once daily 90 tablet 1  . hydrOXYzine (ATARAX/VISTARIL) 10 MG tablet Take 10 mg by mouth 2 (two) times daily as needed for itching or anxiety.     . Melatonin 2.5 MG CAPS Take 2 capsules by mouth at bedtime as needed (sleep).     Marland Kitchen  mycophenolate (MYFORTIC) 180 MG EC tablet Take 360 mg by mouth 2 (two) times daily.     . Omega-3 Fatty Acids (FISH OIL PO) Take by mouth daily.    . risperiDONE (RISPERDAL) 1 MG tablet Take 1 mg by mouth at bedtime.     . simvastatin (ZOCOR) 20 MG tablet Take 1 tablet by mouth once daily 90 tablet 1  . sulfamethoxazole-trimethoprim (BACTRIM,SEPTRA) 400-80 MG tablet Take 1 tablet by mouth every Monday, Wednesday, and Friday.    . tacrolimus (PROGRAF) 0.5 MG capsule See admin instructions. 2 in the am and 1 at night    . carvedilol (COREG) 12.5 MG tablet TAKE 1/2 (ONE-HALF) TABLET BY MOUTH IN THE MORNING AND 1 IN THE EVENING (Patient taking differently: TAKE 1/2 (ONE-HALF) TABLET BY MOUTH IN THE MORNING AND 1/2 IN THE EVENING) 135 tablet 1  . pantoprazole (PROTONIX) 40 MG tablet Take 1 tablet by mouth twice daily (Patient taking differently: Takes one  tablet daily) 180 tablet 0   No facility-administered medications prior to visit.    Allergies  Allergen Reactions  . Amantadine Hcl Itching    REACTION: itching  . Chlorpromazine Hcl     REACTION: "fell flat on face"    ROS As per HPI  PE: Vitals with BMI 05/20/2021 01/21/2021 10/09/2020  Height 5' 9.25" 5' 9.25" 5\' 9"   Weight 185 lbs 3 oz 182 lbs 6 oz 180 lbs  BMI 27.15 93.71 69.67  Systolic 893 810 175  Diastolic 69 58 69  Pulse 67 74 73     Gen: Alert, well appearing.  Patient is oriented to person, place, time, and situation. AFFECT: pleasant, lucid thought and speech. CV: RRR, no m/r/g.   LUNGS: CTA bilat, nonlabored resps, good aeration in all lung fields. EXT: no clubbing or cyanosis.  no edema.    LABS:  Lab Results  Component Value Date   TSH 0.55 01/21/2021   Lab Results  Component Value Date   WBC 4.3 11/28/2020   HGB 14.1 11/28/2020   HCT 43 11/28/2020   MCV 89.3 06/28/2020   PLT 181 11/28/2020   Lab Results  Component Value Date   CREATININE 1.63 (H) 01/21/2021   BUN 27 (H) 01/21/2021   NA 142 01/21/2021   K 4.5 01/21/2021   CL 106 01/21/2021   CO2 22 01/21/2021   Lab Results  Component Value Date   ALT 8 (L) 01/21/2021   AST 12 01/21/2021   ALKPHOS 68 11/28/2020   BILITOT 0.8 01/21/2021   Lab Results  Component Value Date   CHOL 132 01/21/2021   Lab Results  Component Value Date   HDL 44 01/21/2021   Lab Results  Component Value Date   LDLCALC 72 01/21/2021   Lab Results  Component Value Date   TRIG 84 01/21/2021   Lab Results  Component Value Date   CHOLHDL 3.0 01/21/2021   Lab Results  Component Value Date   PSA 4.2 (H) 11/05/2017   PSA 3.51 03/28/2008   PSA 4.83 (H) 12/02/2007   Lab Results  Component Value Date   HGBA1C 5.7 (H) 01/21/2021   IMPRESSION AND PLAN:  1) HTN: well controlled on 1/2 of 12.5mg  coreg bid. Lytes/cr today.  2) HLD: tolerating simvastatin 20mg  qd. LDL goal 70.  Most recent LDL was  72 4 mo ago. Plan recheck flp and hepatic panel next f/u in 4 mo.  3) Hypothyroidism: 25 mcg qd. TSH monitoring today.  4) CRI III, hx of renal  transplant. Avoids nsaids and is working on good hydration habits. Cont tacrolimus, mycophenolate, and bactrim as per renal/transplant MDs. Lytes/cr monitoring today.  5) Preventative health care: Shingrix rx to pharmacy today. Covid x 4 UTD.  An After Visit Summary was printed and given to the patient.  FOLLOW UP: Return in about 4 months (around 09/20/2021) for routine chronic illness f/u. Next cpe 12/2021  Signed:  Crissie Sickles, MD           05/20/2021

## 2021-05-21 ENCOUNTER — Telehealth: Payer: Self-pay | Admitting: Family Medicine

## 2021-05-21 NOTE — Telephone Encounter (Signed)
Left message for patient to schedule Annual Wellness Visit.  Please schedule with Nurse Health Advisor Caroleen Hamman, RN at [facility].

## 2021-05-28 ENCOUNTER — Other Ambulatory Visit: Payer: Self-pay

## 2021-05-28 ENCOUNTER — Ambulatory Visit (INDEPENDENT_AMBULATORY_CARE_PROVIDER_SITE_OTHER): Payer: Medicare HMO

## 2021-05-28 VITALS — BP 110/60 | HR 74 | Temp 97.5°F | Resp 16 | Ht 69.0 in | Wt 184.0 lb

## 2021-05-28 DIAGNOSIS — Z0000 Encounter for general adult medical examination without abnormal findings: Secondary | ICD-10-CM

## 2021-05-28 NOTE — Progress Notes (Signed)
Subjective:   Curtis Jimenez is a 85 y.o. male who presents for Medicare Annual/Subsequent preventive examination.  Review of Systems     Cardiac Risk Factors include: male gender;advanced age (>26men, >10 women);hypertension;dyslipidemia     Objective:    Today's Vitals   05/28/21 1450  BP: 110/60  Pulse: 74  Resp: 16  Temp: (!) 97.5 F (36.4 C)  TempSrc: Temporal  SpO2: 96%  Weight: 184 lb (83.5 kg)  Height: 5\' 9"  (1.753 m)   Body mass index is 27.17 kg/m.  Advanced Directives 05/28/2021 07/29/2020 06/25/2020 06/13/2020 06/08/2020 11/05/2017 10/07/2017  Does Patient Have a Medical Advance Directive? Yes No Yes Yes Yes Yes Yes  Type of Paramedic of [place];Living will - [place];Living will Healthcare Power of [place];Living will [place];Living will Living will  Does patient want to make changes to medical advance directive? - - No - Guardian declined No - Patient declined - - No - Patient declined  Copy of [place] in Chart? Yes - validated most recent copy scanned in chart (See row information) - Yes - validated most recent copy scanned in chart (See row information) No - copy requested - No - copy requested -  Would patient like information on creating a medical advance directive? - No - Patient declined No - Patient declined No - Patient declined No - Patient declined - -    Current Medications (verified) Outpatient Encounter Medications as of 05/28/2021  Medication Sig  . acetaminophen (TYLENOL) 325 MG tablet Take 650 mg by mouth every 6 (six) hours as needed for mild pain.  Marland Kitchen allopurinol (ZYLOPRIM) 300 MG tablet Take 1 tablet by mouth once daily  . carvedilol (COREG) 12.5 MG tablet TAKE 1/2 (ONE-HALF) TABLET BY MOUTH IN THE MORNING AND 1/2 IN THE EVENING  . clopidogrel (PLAVIX) 75 MG tablet Take 1 tablet by mouth once daily  . EUTHYROX 25 MCG tablet Take 1  tablet by mouth once daily  . hydrOXYzine (ATARAX/VISTARIL) 10 MG tablet Take 10 mg by mouth 2 (two) times daily as needed for itching or anxiety.   . Melatonin 2.5 MG CAPS Take 2 capsules by mouth at bedtime as needed (sleep).   . mycophenolate (MYFORTIC) 180 MG EC tablet Take 360 mg by mouth 2 (two) times daily.   . Omega-3 Fatty Acids (FISH OIL PO) Take by mouth daily.  . pantoprazole (PROTONIX) 40 MG tablet Takes one tablet daily  . risperiDONE (RISPERDAL) 1 MG tablet Take 1 mg by mouth at bedtime.   . simvastatin (ZOCOR) 20 MG tablet Take 1 tablet by mouth once daily  . sulfamethoxazole-trimethoprim (BACTRIM,SEPTRA) 400-80 MG tablet Take 1 tablet by mouth every Monday, Wednesday, and Friday.  . tacrolimus (PROGRAF) 0.5 MG capsule See admin instructions. 2 in the am and 1 at night   No facility-administered encounter medications on file as of 05/28/2021.    Allergies (verified) Amantadine hcl and Chlorpromazine hcl   History: Past Medical History:  Diagnosis Date  . Basal cell carcinoma    neck (skin MD 2X per year)  . Bifascicular block 2021   ectopy, asymptomatic.  Cards->observe  . Bipolar disorder ([place])   . BPH (benign prostatic hyperplasia)    with elevated PSA; followed by Dr. Rosana Hoes at [facility]  . Chronic renal insufficiency, stage III (moderate) (HCC)    in pt w/hx of renal transplant.  Post-transplant sCr 1.4-1.6.  Last renal  f/u 05/01/19->Cr 1.63, GFR 39 ml/min  . Coronary atherosclerosis    LM and 3 V dz noted on CT 08/2019 performed for eval of interstitial lung dz in the setting of mild DOE and mild hypoxia. Cards->no stress tesing indicated->primary prevention emphasized  . CVA (cerebral vascular accident) ([place]) 11/2016   Pontine (vertebrobasilar--imaging neg), TPA given.  Pt discharged on plavix.  Carotid dopplers ok, echocardiogram ok.  Residual deficit: vertigo but this improved greatly with therapy.  . DOE (dyspnea on exertion) 2020   DOE and ? hypoxia->CXR  08/29/19--->diffuse interstitial opacities-? acute inflammatory process->Dr. Melvyn Novas eval'd him and was underwhelmed by the CXR and exam->CT chest 09/26/19 "Very subtle areas of mild ground-glass attenuatio-nonspecific", mild air trapping. Per Dr. Melvyn Novas, no signif ILD, improving with inc ambulation (post-inflamm pulm fibrosis?)  . Esophagitis 06/14/2020   EGD->+distal esophagitis, h pylori neg, mild distal esoph stenosis widened with forceps, benign gastric polyps.  . Gallbladder polyp 2015   Asymptomatic  . GERD (gastroesophageal reflux disease)   . Gout   . History of adenomatous polyp of colon 2018   Recall 5 yrs  . Hyperlipidemia   . Hypertension   . Hypothyroidism   . Lumbar spondylosis   . Ptosis due to aging    Right  . Rectal bleeding 09/2017   Admitted for obs due to Hb drop.  GI consulted---obs recommended.  No transfusion required.  Plavix was d/c'd, ferrous sulfate recommended.  Outpt GI f/u--plavix restarted and upper endoscopy was normal and colonoscopy showed 2 polyps and severe diverticular dz. Iron d/c'd 04/24/19.  Marland Kitchen Renal cyst    per pt he got [facility] 12/2020 and the images will be viewed by Dr. Clover Mealy  . Restless legs syndrome   . S/p cadaver renal transplant 2013   Secondary to HTN +lithium toxicity over 30 yrs caused kidney destruction [facility] transplant MDs)  . Seborrheic dermatitis    eyebrows worst: Hytone rx'd by Dr. Denna Haggard.  . 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 AV FISTULA PLACEMENT  04/08/2012   Procedure: ARTERIOVENOUS (AV) FISTULA CREATION;  Surgeon: Angelia Mould, MD;  Location: [facility];  Service: Vascular;  Laterality: Left;  Creation of left brachial cephalic arteriovenous fistula  . BIOPSY  06/14/2020   Procedure: BIOPSY;  Surgeon: Lavena Bullion, DO;  Location: [facility];   Service: Gastroenterology;;  . CATARACT EXTRACTION, BILATERAL  05/2018  . COLONOSCOPY  2013; 11/24/17   2013; Normal except diverticulosis (recall 10 yrs).  2018 (for GI bleeding)--severe diverticular dz + 2 adenomatous polyps.  Recall 5 yrs.  . DG CHEST AP OR PA ([facility] HX)  06/03/2020   Bibasilar atelectasis. No focal consoliadation definitively identified.  . dilation for GERD    . ESOPHAGOGASTRODUODENOSCOPY  11/24/2017   gastric polyps x 2, otherwise normal.  . ESOPHAGOGASTRODUODENOSCOPY N/A 06/14/2020   +distal esophagitis, h pylori neg, mild distal esoph stenosis widened with forceps, benign gastric polypsProcedure: ESOPHAGOGASTRODUODENOSCOPY (EGD);  Surgeon: Lavena Bullion, DO;  Location: [facility];  Service: Gastroenterology;  Laterality: N/A;  Diagnostic EGD as patietn last took Plavix 11am on 06/12/2020  . KIDNEY TRANSPLANT  11/06/12   [place] (cadaveric)  . PROSTATE BIOPSY  2011   no malignancy  . TRANSTHORACIC ECHOCARDIOGRAM  11/2016   Normal LV systolic fxn, EF 95-09%.  Grade I DD.  Mild aortic root  dilatation, mild MV regurg.   Family History  Problem Relation Age of Onset  . Ulcers Mother        GI bleed   . Heart disease Father   . Bladder Cancer Father   . Colon cancer Neg Hx   . Esophageal cancer Neg Hx   . Pancreatic cancer Neg Hx   . Rectal cancer Neg Hx   . Stomach cancer Neg Hx    Social History   Socioeconomic History  . Marital status: Married    Spouse name: Not on file  . Number of children: Not on file  . Years of education: Not on file  . Highest education level: Not on file  Occupational History  . Not on file  Tobacco Use  . Smoking status: Former Smoker    Years: 16.00    Types: Pipe, Cigars    Quit date: 05/11/1974    Years since quitting: 47.0  . Smokeless tobacco: Never Used  Vaping Use  . Vaping Use: Never used  Substance and Sexual Activity  . Alcohol use: No  . Drug use: No  . Sexual activity: Not Currently    Partners: Female   Other Topics Concern  . Not on file  Social History Narrative   Married, 4 children, 9 GGC, [place].   Occupation: retired Marine scientist.  Originally from [place].   Tobacco: quit 1975; smoked pipes and cigars x 15 yrs prior to this.   Alcohol: none.   Exercise: minimal, but is going to sign up for silver sneakers at the [facility].   Social Determinants of Health   Financial Resource Strain: Low Risk   . Difficulty of Paying Living Expenses: Not hard at all  Food Insecurity: No Food Insecurity  . Worried About Charity fundraiser in the Last Year: Never true  . Ran Out of Food in the Last Year: Never true  Transportation Needs: No Transportation Needs  . Lack of Transportation (Medical): No  . Lack of Transportation (Non-Medical): No  Physical Activity: Insufficiently Active  . Days of Exercise per Week: 1 day  . Minutes of Exercise per Session: 30 min  Stress: No Stress Concern Present  . Feeling of Stress : Not at all  Social Connections: Socially Integrated  . Frequency of Communication with Friends and Family: More than three times a week  . Frequency of Social Gatherings with Friends and Family: More than three times a week  . Attends Religious Services: More than 4 times per year  . Active Member of Clubs or Organizations: Yes  . Attends Archivist Meetings: More than 4 times per year  . Marital Status: Married    Tobacco Counseling Counseling given: Not Answered   Clinical Intake:  Pre-visit preparation completed: Yes  Pain : No/denies pain     Nutritional Status: BMI 25 -29 Overweight Nutritional Risks: None Diabetes: No  How often do you need to have someone help you when you read instructions, pamphlets, or other written materials from your doctor or pharmacy?: 1 - Never  Diabetic?No  Interpreter Needed?: No  Information entered by :: Caroleen Hamman LPN   Activities of Daily Living In your present state of health, do you have any difficulty  performing the following activities: 05/28/2021 06/13/2020  Hearing? N -  Vision? N -  Difficulty concentrating or making decisions? Y -  Comment occasionally forgets a name -  Walking or climbing stairs? N -  Comment - -  Dressing or bathing? N -  Doing errands, shopping? N N  Preparing Food and eating ? N -  Using the Toilet? N -  In the past six months, have you accidently leaked urine? N -  Do you have problems with loss of bowel control? N -  Managing your Medications? N -  Managing your Finances? N -  Housekeeping or managing your Housekeeping? N -  Some recent data might be hidden    Patient Care Team: Tammi Sou, MD as PCP - General (Family Medicine) Cottle, Billey Co., MD as Consulting Physician (Psychiatry) Angelia Mould, MD as Consulting Physician (Vascular Surgery) Ralene Bathe, MD as Consulting Physician (Ophthalmology) Pieter Partridge, DO as Consulting Physician (Neurology) Mauri Pole, MD as Consulting Physician (Gastroenterology) Corliss Parish, MD as Consulting Physician (Nephrology) Lavonna Monarch, MD as Consulting Physician (Dermatology) Tanda Rockers, MD as Consulting Physician (Pulmonary Disease) Minus Breeding, MD as Consulting Physician (Cardiology)  Indicate any recent Medical Services you may have received from other than [facility] providers in the past year (date may be approximate).     Assessment:   This is a routine wellness examination for Ger.  Hearing/Vision screen  Hearing Screening   125Hz  250Hz  500Hz  1000Hz  2000Hz  3000Hz  4000Hz  6000Hz  8000Hz   Right ear:           Left ear:           Comments: Pt has noted some mild hearing loss  Vision Screening Comments: Reading glasses Last eye exam-2021-Dr. Tanner  Dietary issues and exercise activities discussed: Current Exercise Habits: Home exercise routine, Type of exercise: walking, Time (Minutes): 30, Frequency (Times/Week): 1, Weekly Exercise (Minutes/Week):  30, Intensity: Mild, Exercise limited by: None identified  Goals Addressed   None    Depression Screen PHQ 2/9 Scores 05/28/2021 06/25/2020 01/23/2020 07/27/2019 09/13/2018 11/05/2017 11/05/2017  PHQ - 2 Score 0 0 0 0 0 0 0    Fall Risk Fall Risk  05/28/2021 05/20/2021 01/23/2020 09/13/2018 11/05/2017  Falls in the past year? 1 0 1 No No  Number falls in past yr: 1 0 1 - -  Injury with Fall? 0 0 1 - -  Risk Factor Category  - - - - -  Risk for fall due to : History of fall(s) Impaired mobility - - -  Follow up Falls prevention discussed - Falls evaluation completed - -    FALL RISK PREVENTION PERTAINING TO THE HOME:  Any stairs in or around the home? No  Home free of loose throw rugs in walkways, pet beds, electrical cords, etc? Yes  Adequate lighting in your home to reduce risk of falls? Yes   ASSISTIVE DEVICES UTILIZED TO PREVENT FALLS:  Life alert? No  Use of a cane, walker or w/c? No  Grab bars in the bathroom? Yes  Shower chair or bench in shower? No  Elevated toilet seat or a handicapped toilet? No   TIMED UP AND GO:  Was the test performed? Yes .  Length of time to ambulate 10 feet: 10 sec.   Gait slow and steady without use of assistive device  Cognitive Function:Normal cognitive status assessed by direct observation by this Nurse Health Advisor. No abnormalities found.          Immunizations Immunization History  Administered Date(s) Administered  . Fluad Quad(high Dose 65+) 09/15/2019, 09/26/2020, 10/26/2020  . Hepatitis A, Adult 08/14/2016, 05/31/2017  . Hepatitis B 02/15/2012, 03/14/2012, 08/15/2012  . Influenza Split 09/27/2012  . Influenza Whole 12/16/2009, 09/18/2010, 09/28/2011  .  Influenza, High Dose Seasonal PF 11/04/2016, 11/05/2017, 09/13/2018  . Influenza-Unspecified 09/27/2013  . PFIZER(Purple Top)SARS-COV-2 Vaccination 01/08/2020, 02/01/2020, 08/17/2020, 04/01/2021  . PPD Test 06/27/2012  . Pneumococcal Conjugate-13 07/19/2015, 09/26/2020  .  Pneumococcal Polysaccharide-23 12/29/2003  . Td 12/29/2003  . Tdap 09/27/2012, 04/27/2014  . Typhoid Inactivated 08/14/2016  . Zoster, Live 04/18/2008    TDAP status: Up to date  Flu Vaccine status: Up to date  Pneumococcal vaccine status: Up to date  Covid-19 vaccine status: Completed vaccines  Qualifies for Shingles Vaccine? Yes   Zostavax completed Yes   Shingrix Completed?: No.    Education has been provided regarding the importance of this vaccine. Patient has been advised to call insurance company to determine out of pocket expense if they have not yet received this vaccine. Advised may also receive vaccine at local pharmacy or Health Dept. Verbalized acceptance and understanding.  Screening Tests Health Maintenance  Topic Date Due  . Zoster Vaccines- Shingrix (1 of 2) Never done  . INFLUENZA VACCINE  07/28/2021  . TETANUS/TDAP  04/27/2024  . COVID-19 Vaccine  Completed  . PNA vac Low Risk Adult  Completed  . HPV VACCINES  Aged Out    Health Maintenance  Health Maintenance Due  Topic Date Due  . Zoster Vaccines- Shingrix (1 of 2) Never done    Colorectal cancer screening: No longer required.   Lung Cancer Screening: (Low Dose CT Chest recommended if Age 69-80 years, 30 pack-year currently smoking OR have quit w/in 15years.) does not qualify.     Additional Screening:  Hepatitis C Screening: does not qualify  Vision Screening: Recommended annual ophthalmology exams for early detection of glaucoma and other disorders of the eye. Is the patient up to date with their annual eye exam?  Yes  Who is the provider or what is the name of the office in which the patient attends annual eye exams? Dr. Satira Sark  Dental Screening: Recommended annual dental exams for proper oral hygiene  Community Resource Referral / Chronic Care Management: CRR required this visit?  No   CCM required this visit?  No      Plan:     I have personally reviewed and noted the following  in the patient's chart:   . Medical and social history . Use of alcohol, tobacco or illicit drugs  . Current medications and supplements including opioid prescriptions. Patient is not currently taking opioid prescriptions. . Functional ability and status . Nutritional status . Physical activity . Advanced directives . List of other physicians . Hospitalizations, surgeries, and ER visits in previous 12 months . Vitals . Screenings to include cognitive, depression, and falls . Referrals and appointments  In addition, I have reviewed and discussed with patient certain preventive protocols, quality metrics, and best practice recommendations. A written personalized care plan for preventive services as well as general preventive health recommendations were provided to patient.   Patient would like to access avs on mychart.  Marta Antu, LPN   06/27/2457  Nurse Health Advisor  Nurse Notes: None

## 2021-05-28 NOTE — Patient Instructions (Signed)
Curtis Jimenez , Thank you for taking time to come for your Medicare Wellness Visit. I appreciate your ongoing commitment to your health goals. Please review the following plan we discussed and let me know if I can assist you in the future.   Screening recommendations/referrals: Colonoscopy: No longer required Recommended yearly ophthalmology/optometry visit for glaucoma screening and checkup Recommended yearly dental visit for hygiene and checkup  Vaccinations: Influenza vaccine: Up to date Pneumococcal vaccine: Completed vaccines Tdap vaccine: Up to date-Due 04/27/2024 Shingles vaccine: Completed first dose last week   Covid-19: Up to date  Advanced directives: Copy in chart  Conditions/risks identified: See problem list  Next appointment: Follow up in one year for your annual wellness visit.   Preventive Care 76 Years and Older, Male Preventive care refers to lifestyle choices and visits with your health care provider that can promote health and wellness. What does preventive care include?  A yearly physical exam. This is also called an annual well check.  Dental exams once or twice a year.  Routine eye exams. Ask your health care provider how often you should have your eyes checked.  Personal lifestyle choices, including:  Daily care of your teeth and gums.  Regular physical activity.  Eating a healthy diet.  Avoiding tobacco and drug use.  Limiting alcohol use.  Practicing safe sex.  Taking low doses of aspirin every day.  Taking vitamin and mineral supplements as recommended by your health care provider. What happens during an annual well check? The services and screenings done by your health care provider during your annual well check will depend on your age, overall health, lifestyle risk factors, and family history of disease. Counseling  Your health care provider may ask you questions about your:  Alcohol use.  Tobacco use.  Drug use.  Emotional  well-being.  Home and relationship well-being.  Sexual activity.  Eating habits.  History of falls.  Memory and ability to understand (cognition).  Work and work Statistician. Screening  You may have the following tests or measurements:  Height, weight, and BMI.  Blood pressure.  Lipid and cholesterol levels. These may be checked every 5 years, or more frequently if you are over 4 years old.  Skin check.  Lung cancer screening. You may have this screening every year starting at age 37 if you have a 30-pack-year history of smoking and currently smoke or have quit within the past 15 years.  Fecal occult blood test (FOBT) of the stool. You may have this test every year starting at age 57.  Flexible sigmoidoscopy or colonoscopy. You may have a sigmoidoscopy every 5 years or a colonoscopy every 10 years starting at age 48.  Prostate cancer screening. Recommendations will vary depending on your family history and other risks.  Hepatitis C blood test.  Hepatitis B blood test.  Sexually transmitted disease (STD) testing.  Diabetes screening. This is done by checking your blood sugar (glucose) after you have not eaten for a while (fasting). You may have this done every 1-3 years.  Abdominal aortic aneurysm (AAA) screening. You may need this if you are a current or former smoker.  Osteoporosis. You may be screened starting at age 42 if you are at high risk. Talk with your health care provider about your test results, treatment options, and if necessary, the need for more tests. Vaccines  Your health care provider may recommend certain vaccines, such as:  Influenza vaccine. This is recommended every year.  Tetanus, diphtheria, and acellular pertussis (  Tdap, Td) vaccine. You may need a Td booster every 10 years.  Zoster vaccine. You may need this after age 68.  Pneumococcal 13-valent conjugate (PCV13) vaccine. One dose is recommended after age 13.  Pneumococcal  polysaccharide (PPSV23) vaccine. One dose is recommended after age 44. Talk to your health care provider about which screenings and vaccines you need and how often you need them. This information is not intended to replace advice given to you by your health care provider. Make sure you discuss any questions you have with your health care provider. Document Released: 01/10/2016 Document Revised: 09/02/2016 Document Reviewed: 10/15/2015 Elsevier Interactive Patient Education  2017 [employer] Prevention in the Home Falls can cause injuries. They can happen to people of all ages. There are many things you can do to make your home safe and to help prevent falls. What can I do on the outside of my home?  Regularly fix the edges of walkways and driveways and fix any cracks.  Remove anything that might make you trip as you walk through a door, such as a raised step or threshold.  Trim any bushes or trees on the path to your home.  Use bright outdoor lighting.  Clear any walking paths of anything that might make someone trip, such as rocks or tools.  Regularly check to see if handrails are loose or broken. Make sure that both sides of any steps have handrails.  Any raised decks and porches should have guardrails on the edges.  Have any leaves, snow, or ice cleared regularly.  Use sand or salt on walking paths during winter.  Clean up any spills in your garage right away. This includes oil or grease spills. What can I do in the bathroom?  Use night lights.  Install grab bars by the toilet and in the tub and shower. Do not use towel bars as grab bars.  Use non-skid mats or decals in the tub or shower.  If you need to sit down in the shower, use a plastic, non-slip stool.  Keep the floor dry. Clean up any water that spills on the floor as soon as it happens.  Remove soap buildup in the tub or shower regularly.  Attach bath mats securely with double-sided non-slip rug  tape.  Do not have throw rugs and other things on the floor that can make you trip. What can I do in the bedroom?  Use night lights.  Make sure that you have a light by your bed that is easy to reach.  Do not use any sheets or blankets that are too big for your bed. They should not hang down onto the floor.  Have a firm chair that has side arms. You can use this for support while you get dressed.  Do not have throw rugs and other things on the floor that can make you trip. What can I do in the kitchen?  Clean up any spills right away.  Avoid walking on wet floors.  Keep items that you use a lot in easy-to-reach places.  If you need to reach something above you, use a strong step stool that has a grab bar.  Keep electrical cords out of the way.  Do not use floor polish or wax that makes floors slippery. If you must use wax, use non-skid floor wax.  Do not have throw rugs and other things on the floor that can make you trip. What can I do with my stairs?  Do  not leave any items on the stairs.  Make sure that there are handrails on both sides of the stairs and use them. Fix handrails that are broken or loose. Make sure that handrails are as long as the stairways.  Check any carpeting to make sure that it is firmly attached to the stairs. Fix any carpet that is loose or worn.  Avoid having throw rugs at the top or bottom of the stairs. If you do have throw rugs, attach them to the floor with carpet tape.  Make sure that you have a light switch at the top of the stairs and the bottom of the stairs. If you do not have them, ask someone to add them for you. What else can I do to help prevent falls?  Wear shoes that:  Do not have high heels.  Have rubber bottoms.  Are comfortable and fit you well.  Are closed at the toe. Do not wear sandals.  If you use a stepladder:  Make sure that it is fully opened. Do not climb a closed stepladder.  Make sure that both sides of the  stepladder are locked into place.  Ask someone to hold it for you, if possible.  Clearly mark and make sure that you can see:  Any grab bars or handrails.  First and last steps.  Where the edge of each step is.  Use tools that help you move around (mobility aids) if they are needed. These include:  Canes.  Walkers.  Scooters.  Crutches.  Turn on the lights when you go into a dark area. Replace any light bulbs as soon as they burn out.  Set up your furniture so you have a clear path. Avoid moving your furniture around.  If any of your floors are uneven, fix them.  If there are any pets around you, be aware of where they are.  Review your medicines with your doctor. Some medicines can make you feel dizzy. This can increase your chance of falling. Ask your doctor what other things that you can do to help prevent falls. This information is not intended to replace advice given to you by your health care provider. Make sure you discuss any questions you have with your health care provider. Document Released: 10/10/2009 Document Revised: 05/21/2016 Document Reviewed: 01/18/2015 Elsevier Interactive Patient Education  2017 [employer].

## 2021-06-03 ENCOUNTER — Telehealth: Payer: Self-pay

## 2021-06-03 ENCOUNTER — Telehealth: Payer: Self-pay | Admitting: Physician Assistant

## 2021-06-03 NOTE — Telephone Encounter (Signed)
Patient called to state he had a gall bladder attach during the night.  Dr. Anitra Lauth has no available appts today. Referred him to contact his GI doctor - Dr. Silverio Decamp.  He will follow up with [place] if needed.

## 2021-06-03 NOTE — Telephone Encounter (Signed)
Spoke with patient, he states that he had RUQ abdominal pain all night until about 6 am. Pt states that the pain radiated up into his chest. Patient has been scheduled to see Alonza Bogus, PA-C on 06/04/21 at 8:30 am. Advised patient that if he develops any severe abdominal pain prior to his appt he will need to go to the ED for evaluation. Patient verbalized understanding and had no concerns at the end of the call.   Elwin Mocha, CMA notified that patient was added on.

## 2021-06-03 NOTE — Telephone Encounter (Signed)
Inbound call from patient's wife stating patient was in a lot of pain all last night and is wanting to schedule appt asap.  Next availability as of now is in July.  Please advise.

## 2021-06-04 ENCOUNTER — Other Ambulatory Visit: Payer: Self-pay

## 2021-06-04 ENCOUNTER — Other Ambulatory Visit (INDEPENDENT_AMBULATORY_CARE_PROVIDER_SITE_OTHER): Payer: Medicare HMO

## 2021-06-04 ENCOUNTER — Encounter: Payer: Self-pay | Admitting: Gastroenterology

## 2021-06-04 ENCOUNTER — Ambulatory Visit: Payer: Medicare HMO | Admitting: Gastroenterology

## 2021-06-04 ENCOUNTER — Telehealth: Payer: Self-pay

## 2021-06-04 VITALS — BP 76/42 | HR 74 | Ht 69.0 in | Wt 183.4 lb

## 2021-06-04 DIAGNOSIS — R112 Nausea with vomiting, unspecified: Secondary | ICD-10-CM

## 2021-06-04 DIAGNOSIS — R1011 Right upper quadrant pain: Secondary | ICD-10-CM | POA: Diagnosis not present

## 2021-06-04 LAB — CBC WITH DIFFERENTIAL/PLATELET
Basophils Absolute: 0.1 10*3/uL (ref 0.0–0.1)
Basophils Relative: 1.1 % (ref 0.0–3.0)
Eosinophils Absolute: 0.1 10*3/uL (ref 0.0–0.7)
Eosinophils Relative: 2.3 % (ref 0.0–5.0)
HCT: 42.4 % (ref 39.0–52.0)
Hemoglobin: 13.9 g/dL (ref 13.0–17.0)
Lymphocytes Relative: 15.2 % (ref 12.0–46.0)
Lymphs Abs: 0.8 10*3/uL (ref 0.7–4.0)
MCHC: 32.7 g/dL (ref 30.0–36.0)
MCV: 86.2 fl (ref 78.0–100.0)
Monocytes Absolute: 0.7 10*3/uL (ref 0.1–1.0)
Monocytes Relative: 11.8 % (ref 3.0–12.0)
Neutro Abs: 3.9 10*3/uL (ref 1.4–7.7)
Neutrophils Relative %: 69.6 % (ref 43.0–77.0)
Platelets: 193 10*3/uL (ref 150.0–400.0)
RBC: 4.92 Mil/uL (ref 4.22–5.81)
RDW: 14.4 % (ref 11.5–15.5)
WBC: 5.5 10*3/uL (ref 4.0–10.5)

## 2021-06-04 LAB — COMPREHENSIVE METABOLIC PANEL
ALT: 9 U/L (ref 0–53)
AST: 14 U/L (ref 0–37)
Albumin: 3.9 g/dL (ref 3.5–5.2)
Alkaline Phosphatase: 53 U/L (ref 39–117)
BUN: 28 mg/dL — ABNORMAL HIGH (ref 6–23)
CO2: 26 mEq/L (ref 19–32)
Calcium: 9.5 mg/dL (ref 8.4–10.5)
Chloride: 105 mEq/L (ref 96–112)
Creatinine, Ser: 1.91 mg/dL — ABNORMAL HIGH (ref 0.40–1.50)
GFR: 32.25 mL/min — ABNORMAL LOW (ref >60.00)
Glucose, Bld: 131 mg/dL — ABNORMAL HIGH (ref 70–99)
Potassium: 3.8 mEq/L (ref 3.5–5.1)
Sodium: 140 mEq/L (ref 135–145)
Total Bilirubin: 0.8 mg/dL (ref 0.2–1.2)
Total Protein: 6.5 g/dL (ref 6.0–8.3)

## 2021-06-04 MED ORDER — PANTOPRAZOLE SODIUM 40 MG PO TBEC: 40.0000 mg | DELAYED_RELEASE_TABLET | Freq: Two times a day (BID) | ORAL | 3 refills | Status: DC

## 2021-06-04 NOTE — Telephone Encounter (Signed)
Called patient and let him know that Curtis Jimenez wanted to him to try increasing Pantoprazole to 40 mg twice daily 30 minutes before breakfast and dinner. Patient had no questions at the end om th call.

## 2021-06-04 NOTE — Progress Notes (Signed)
06/04/2021 Curtis Jimenez [id number] 01/08/1936   HISTORY OF PRESENT ILLNESS: This is an 85 year old male who is a patient of Dr. Woodward Jimenez.  His past medical history is listed below.  He is here today with his wife with complaints of right-sided abdominal pain.  He describes having 6 episodes of this pain over the past year.  He says that it is very severe when it occurs.  In the right mid to upper abdomen and goes to his right chest and around to the right side of his back.  He had an episode of this pain Monday night and it lasted all night into early Tuesday morning.  And then it resolves and he feels well until it happens again.  His wife describes the pain as very intense and dramatic.  He does have nausea and vomiting when these episodes of pain occur.  He tells me that some years ago he had a surgeon who was going to take out his gallbladder, but he opted to just follow a low-fat diet at that time instead.  06/14/2020 EGD with Dr. Bryan Jimenez with LA grade a reflux esophagitis, benign-appearing esophageal stenosis which was fractured with cold forceps given the patient was still on anticoagulation, ectopic gastric mucosa in the upper third of the esophagus, multiple gastric polyps and otherwise normal.   Past Medical History:  Diagnosis Date  . Basal cell carcinoma    neck (skin MD 2X per year)  . Bifascicular block 2021   ectopy, asymptomatic.  Cards->observe  . Bipolar disorder ([place])   . BPH (benign prostatic hyperplasia)    with elevated PSA; followed by Dr. Rosana Jimenez at [facility]  . Chronic renal insufficiency, stage III (moderate) (HCC)    in pt w/hx of renal transplant.  Post-transplant sCr 1.4-1.6.  Last renal f/u 05/01/19->Cr 1.63, GFR 39 ml/min  . Coronary atherosclerosis    LM and 3 V dz noted on CT 08/2019 performed for eval of interstitial lung dz in the setting of mild DOE and mild hypoxia. Cards->no stress tesing indicated->primary prevention emphasized  . CVA  (cerebral vascular accident) ([place]) 11/2016   Pontine (vertebrobasilar--imaging neg), TPA given.  Pt discharged on plavix.  Carotid dopplers ok, echocardiogram ok.  Residual deficit: vertigo but this improved greatly with therapy.  . DOE (dyspnea on exertion) 2020   DOE and ? hypoxia->CXR 08/29/19--->diffuse interstitial opacities-? acute inflammatory process->Dr. Melvyn Jimenez eval'd him and was underwhelmed by the CXR and exam->CT chest 09/26/19 "Very subtle areas of mild ground-glass attenuatio-nonspecific", mild air trapping. Per Dr. Melvyn Jimenez, no signif ILD, improving with inc ambulation (post-inflamm pulm fibrosis?)  . Esophagitis 06/14/2020   EGD->+distal esophagitis, h pylori neg, mild distal esoph stenosis widened with forceps, benign gastric polyps.  . Gallbladder polyp 2015   Asymptomatic  . GERD (gastroesophageal reflux disease)   . Gout   . History of adenomatous polyp of colon 2018   Recall 5 yrs  . Hyperlipidemia   . Hypertension   . Hypothyroidism   . Lumbar spondylosis   . Ptosis due to aging    Right  . Rectal bleeding 09/2017   Admitted for obs due to Hb drop.  GI consulted---obs recommended.  No transfusion required.  Plavix was d/c'd, ferrous sulfate recommended.  Outpt GI f/u--plavix restarted and upper endoscopy was normal and colonoscopy showed 2 polyps and severe diverticular dz. Iron d/c'd 04/24/19.  Marland Kitchen Renal cyst    per pt he got [facility] 12/2020 and the  images will be viewed by Dr. Clover Jimenez  . Restless legs syndrome   . S/p cadaver renal transplant 2013   Secondary to HTN +lithium toxicity over 30 yrs caused kidney destruction [facility] transplant MDs)  . Seborrheic dermatitis    eyebrows worst: Hytone rx'd by Dr. Denna Haggard.  . 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 AV FISTULA PLACEMENT  04/08/2012   Procedure:  ARTERIOVENOUS (AV) FISTULA CREATION;  Surgeon: Angelia Mould, MD;  Location: [facility] OR;  Service: Vascular;  Laterality: Left;  Creation of left brachial cephalic arteriovenous fistula  . BIOPSY  06/14/2020   Procedure: BIOPSY;  Surgeon: Lavena Bullion, DO;  Location: [facility];  Service: Gastroenterology;;  . CATARACT EXTRACTION, BILATERAL  05/2018  . COLONOSCOPY  2013; 11/24/17   2013; Normal except diverticulosis (recall 10 yrs).  2018 (for GI bleeding)--severe diverticular dz + 2 adenomatous polyps.  Recall 5 yrs.  . DG CHEST AP OR PA ([facility] HX)  06/03/2020   Bibasilar atelectasis. No focal consoliadation definitively identified.  . dilation for GERD    . ESOPHAGOGASTRODUODENOSCOPY  11/24/2017   gastric polyps x 2, otherwise normal.  . ESOPHAGOGASTRODUODENOSCOPY N/A 06/14/2020   +distal esophagitis, h pylori neg, mild distal esoph stenosis widened with forceps, benign gastric polypsProcedure: ESOPHAGOGASTRODUODENOSCOPY (EGD);  Surgeon: Lavena Bullion, DO;  Location: [facility];  Service: Gastroenterology;  Laterality: N/A;  Diagnostic EGD as patietn last took Plavix 11am on 06/12/2020  . KIDNEY TRANSPLANT  11/06/12   [place] (cadaveric)  . PROSTATE BIOPSY  2011   no malignancy  . TRANSTHORACIC ECHOCARDIOGRAM  11/2016   Normal LV systolic fxn, EF 22-02%.  Grade I DD.  Mild aortic root dilatation, mild MV regurg.    reports that he quit smoking about 47 years ago. His smoking use included pipe and cigars. He quit after 16.00 years of use. He has never used smokeless tobacco. He reports that he does not drink alcohol and does not use drugs. family history includes Bladder Cancer in his father; Heart disease in his father; Ulcers in his mother. Allergies  Allergen Reactions  . Amantadine Hcl Itching    REACTION: itching  . Chlorpromazine Hcl     REACTION: "fell flat on face"      Outpatient Encounter Medications as of 06/04/2021  Medication Sig  . acetaminophen (TYLENOL)  325 MG tablet Take 650 mg by mouth every 6 (six) hours as needed for mild pain.  Marland Kitchen allopurinol (ZYLOPRIM) 300 MG tablet Take 1 tablet by mouth once daily  . carvedilol (COREG) 12.5 MG tablet TAKE 1/2 (ONE-HALF) TABLET BY MOUTH IN THE MORNING AND 1/2 IN THE EVENING  . clopidogrel (PLAVIX) 75 MG tablet Take 1 tablet by mouth once daily  . colchicine 0.6 MG tablet Take 1 tablet by mouth daily as needed.  Arna Medici 25 MCG tablet Take 1 tablet by mouth once daily  . hydrOXYzine (ATARAX/VISTARIL) 10 MG tablet Take 10 mg by mouth 2 (two) times daily as needed for itching or anxiety.   . Melatonin 2.5 MG CAPS Take 2 capsules by mouth at bedtime as needed (sleep).   . Multiple Vitamin (MULTIVITAMIN) tablet Take 1 tablet by mouth daily.  . mycophenolate (MYFORTIC) 180 MG EC tablet Take 360 mg by mouth 2 (two) times daily.   . Omega-3 Fatty Acids (FISH OIL PO) Take by mouth daily.  Marland Kitchen  pantoprazole (PROTONIX) 40 MG tablet Takes one tablet daily  . risperiDONE (RISPERDAL) 1 MG tablet Take 1 mg by mouth at bedtime.   . simvastatin (ZOCOR) 20 MG tablet Take 1 tablet by mouth once daily  . sulfamethoxazole-trimethoprim (BACTRIM,SEPTRA) 400-80 MG tablet Take 1 tablet by mouth every Monday, Wednesday, and Friday.  . tacrolimus (PROGRAF) 0.5 MG capsule See admin instructions. 2 in the am and 1 at night   No facility-administered encounter medications on file as of 06/04/2021.    REVIEW OF SYSTEMS  : All other systems reviewed and negative except where noted in the History of Present Illness.   PHYSICAL EXAM: BP (!) 76/42 (BP Location: Right Arm, Patient Position: Sitting, Cuff Size: Normal)   Pulse 74   Ht 5\' 9"  (1.753 m)   Wt 183 lb 6 oz (83.2 kg)   SpO2 96%   BMI 27.08 kg/m  General: Well developed white male in no acute distress Head: Normocephalic and atraumatic Eyes:  Sclerae anicteric, conjunctiva pink. Ears: Normal auditory acuity Lungs: Clear throughout to auscultation; no W/R/R. Heart:  Regular rate and rhythm; no M/R/G. Abdomen: Soft, non-distended.  BS present.  Non-tender. Musculoskeletal: Symmetrical with no gross deformities  Skin: No lesions on visible extremities Extremities: No edema  Neurological: Alert oriented x 4, grossly non-focal Psychological:  Alert and cooperative. Normal mood and affect  ASSESSMENT AND PLAN: *Episodic right sided abdominal pain with associated nausea and vomiting:  Right mid to upper abdominal pain.  He describes 6 episodes of this over the past year, the last one on Monday.  He says that it lasted all Monday night and finally resolved early Monday morning.  Once the pain is gone then it has completely resolved until the next episode.  He tells me that he saw a surgeon some years ago who had wanted to take his gallbladder out, but he opted to follow a low-fat diet instead.  I do not see any specific imaging of the gallbladder as of late.  We will plan for ultrasound of the right upper quadrant and if negative then HIDA scan with CCK.  We will check CBC, CMP today.  I am going to go ahead and place a referral to [facility].  **Of note, his blood pressure was very low in the office today.  He denied feeling dizzy or faint.  Could possibly be some dehydration from very little p.o. intake when he had episode of pain earlier this week.  Advised him to hydrate well today and to monitor his blood pressure at home.   CC:  McGowen, Adrian Blackwater, MD

## 2021-06-04 NOTE — Patient Instructions (Addendum)
If you are age 83 or older, your body mass index should be between 23-30. Your Body mass index is 27.08 kg/m. If this is out of the aforementioned range listed, please consider follow up with your Primary Care Provider.  If you are age 74 or younger, your body mass index should be between 19-25. Your Body mass index is 27.08 kg/m. If this is out of the aformentioned range listed, please consider follow up with your Primary Care Provider.   You have been scheduled for an abdominal ultrasound at [facility] (1st floor of hospital) on 06/13/21 at 8am. Please arrive 15 minutes prior to your appointment for registration. Make certain not to have anything to eat or drink 6 hours prior to your appointment. Should you need to reschedule your appointment, please contact radiology at [phone]. This test typically takes about 30 minutes to perform.  Your provider has requested that you go to the basement level for lab work before leaving today. Press "B" on the elevator. The lab is located at the first door on the left as you exit the elevator.  You have been scheduled for a HIDA scan at [facility] (1st floor) on 06/18/21. Please arrive 15 minutes prior to your scheduled appointment at  2:72ZD. Make certain not to have anything to eat or drink at least 6 hours prior to your test. Should this appointment date or time not work well for you, please call radiology scheduling at [phone].  _____________________________________________________________________ hepatobiliary (HIDA) scan is an imaging procedure used to diagnose problems in the liver, gallbladder and bile ducts. In the HIDA scan, a radioactive chemical or tracer is injected into a vein in your arm. The tracer is handled by the liver like bile. Bile is a fluid produced and excreted by your liver that helps your digestive system break down fats in the foods you eat. Bile is stored in your gallbladder and the gallbladder releases the  bile when you eat a meal. A special nuclear medicine scanner (gamma camera) tracks the flow of the tracer from your liver into your gallbladder and small intestine.  During your HIDA scan  You'll be asked to change into a hospital gown before your HIDA scan begins. Your health care team will position you on a table, usually on your back. The radioactive tracer is then injected into a vein in your arm.The tracer travels through your bloodstream to your liver, where it's taken up by the bile-producing cells. The radioactive tracer travels with the bile from your liver into your gallbladder and through your bile ducts to your small intestine.You may feel some pressure while the radioactive tracer is injected into your vein. As you lie on the table, a special gamma camera is positioned over your abdomen taking pictures of the tracer as it moves through your body. The gamma camera takes pictures continually for about an hour. You'll need to keep still during the HIDA scan. This can become uncomfortable, but you may find that you can lessen the discomfort by taking deep breaths and thinking about other things. Tell your health care team if you're uncomfortable. The radiologist will watch on a computer the progress of the radioactive tracer through your body. The HIDA scan may be stopped when the radioactive tracer is seen in the gallbladder and enters your small intestine. This typically takes about an hour. In some cases extra imaging will be performed if original images aren't satisfactory, if morphine is given to help visualize the gallbladder or  if the medication CCK is given to look at the contraction of the gallbladder. This test typically takes 2 hours to complete.  The [place] [facility] providers would like to encourage you to use [facility] to communicate with providers for non-urgent requests or questions.  Due to long hold times on the telephone, sending your provider a message by [facility] may be a faster and more  efficient way to get a response.  Please allow 48 business hours for a response.  Please remember that this is for non-urgent requests.   It was a pleasure to see you today!  Thank you for trusting me with your gastrointestinal care!    Alonza Bogus, PA-C

## 2021-06-05 NOTE — Progress Notes (Signed)
Reviewed and agree with documentation and assessment and plan. K. Veena Christifer Chapdelaine , MD   

## 2021-06-10 DIAGNOSIS — Z940 Kidney transplant status: Secondary | ICD-10-CM | POA: Diagnosis not present

## 2021-06-13 ENCOUNTER — Other Ambulatory Visit: Payer: Self-pay

## 2021-06-13 ENCOUNTER — Telehealth: Payer: Self-pay

## 2021-06-13 ENCOUNTER — Ambulatory Visit (HOSPITAL_COMMUNITY)
Admission: RE | Admit: 2021-06-13 | Discharge: 2021-06-13 | Disposition: A | Discharge status: Home / Self Care | Payer: Medicare HMO | Source: Ambulatory Visit | Attending: Gastroenterology | Admitting: Gastroenterology

## 2021-06-13 DIAGNOSIS — R1011 Right upper quadrant pain: Secondary | ICD-10-CM | POA: Insufficient documentation

## 2021-06-13 DIAGNOSIS — R112 Nausea with vomiting, unspecified: Secondary | ICD-10-CM | POA: Insufficient documentation

## 2021-06-13 NOTE — Telephone Encounter (Signed)
Received call report from [facility] regarding [place] result showed enlarged gallbladder. See report in epic.

## 2021-06-18 ENCOUNTER — Ambulatory Visit (HOSPITAL_COMMUNITY): Payer: Medicare HMO

## 2021-06-18 ENCOUNTER — Other Ambulatory Visit: Payer: Self-pay

## 2021-06-18 DIAGNOSIS — K828 Other specified diseases of gallbladder: Secondary | ICD-10-CM

## 2021-06-18 DIAGNOSIS — K838 Other specified diseases of biliary tract: Secondary | ICD-10-CM

## 2021-06-25 ENCOUNTER — Other Ambulatory Visit: Payer: Self-pay | Admitting: Gastroenterology

## 2021-06-25 ENCOUNTER — Other Ambulatory Visit: Payer: Self-pay

## 2021-06-25 ENCOUNTER — Ambulatory Visit (HOSPITAL_COMMUNITY)
Admission: RE | Admit: 2021-06-25 | Discharge: 2021-06-25 | Disposition: A | Discharge status: Home / Self Care | Payer: Medicare HMO | Source: Ambulatory Visit | Attending: Gastroenterology | Admitting: Gastroenterology

## 2021-06-25 DIAGNOSIS — K838 Other specified diseases of biliary tract: Secondary | ICD-10-CM | POA: Insufficient documentation

## 2021-06-25 DIAGNOSIS — K828 Other specified diseases of gallbladder: Secondary | ICD-10-CM

## 2021-06-25 DIAGNOSIS — K5730 Diverticulosis of large intestine without perforation or abscess without bleeding: Secondary | ICD-10-CM | POA: Diagnosis not present

## 2021-06-25 DIAGNOSIS — Z940 Kidney transplant status: Secondary | ICD-10-CM | POA: Diagnosis not present

## 2021-06-25 DIAGNOSIS — N281 Cyst of kidney, acquired: Secondary | ICD-10-CM | POA: Diagnosis not present

## 2021-07-22 DIAGNOSIS — K828 Other specified diseases of gallbladder: Secondary | ICD-10-CM | POA: Diagnosis not present

## 2021-07-22 DIAGNOSIS — R1011 Right upper quadrant pain: Secondary | ICD-10-CM | POA: Diagnosis not present

## 2021-07-23 ENCOUNTER — Encounter: Payer: Self-pay | Admitting: Family Medicine

## 2021-07-24 ENCOUNTER — Other Ambulatory Visit: Payer: Self-pay | Admitting: Family Medicine

## 2021-07-28 ENCOUNTER — Ambulatory Visit (HOSPITAL_COMMUNITY): Payer: Medicare HMO

## 2021-07-29 ENCOUNTER — Other Ambulatory Visit: Payer: Self-pay

## 2021-07-29 ENCOUNTER — Encounter (HOSPITAL_COMMUNITY)
Admission: RE | Admit: 2021-07-29 | Discharge: 2021-07-29 | Disposition: A | Discharge status: Home / Self Care | Payer: Medicare HMO | Source: Ambulatory Visit | Attending: Gastroenterology | Admitting: Gastroenterology

## 2021-07-29 ENCOUNTER — Other Ambulatory Visit: Payer: Self-pay | Admitting: Gastroenterology

## 2021-07-29 DIAGNOSIS — R112 Nausea with vomiting, unspecified: Secondary | ICD-10-CM

## 2021-07-29 DIAGNOSIS — R1011 Right upper quadrant pain: Secondary | ICD-10-CM

## 2021-07-29 DIAGNOSIS — Z9049 Acquired absence of other specified parts of digestive tract: Secondary | ICD-10-CM | POA: Diagnosis not present

## 2021-07-29 MED ORDER — MORPHINE SULFATE (PF) 4 MG/ML IV SOLN
INTRAVENOUS | Status: AC
  Administered 2021-07-29: 3 mg
  Filled 2021-07-29: qty 1

## 2021-07-29 MED ORDER — MORPHINE BOLUS VIA INFUSION: 3.0000 mg | Freq: Once | INTRAVENOUS | Status: DC

## 2021-07-29 MED ORDER — TECHNETIUM TC 99M MEBROFENIN IV KIT
5.0000 | PACK | Freq: Once | INTRAVENOUS | Status: AC | PRN
  Administered 2021-07-29: 5 via INTRAVENOUS

## 2021-07-29 MED ORDER — MORPHINE SULFATE (PF) 4 MG/ML IV SOLN: 3.0000 mg | Freq: Once | INTRAVENOUS | Status: AC

## 2021-07-30 ENCOUNTER — Encounter: Payer: Self-pay | Admitting: Family Medicine

## 2021-07-30 ENCOUNTER — Ambulatory Visit: Payer: Self-pay | Admitting: Surgery

## 2021-07-30 ENCOUNTER — Other Ambulatory Visit: Payer: Self-pay | Admitting: Family Medicine

## 2021-07-30 NOTE — Progress Notes (Signed)
NM hepatobiliary scan is abnormal with non-visualization of the gallbladder consistent with cystic duct obstruction.  Recommend proceeding with cholecystectomy as we discussed at the office visit.  Will enter orders for surgery and send to scheduling.  The risks and benefits of the procedure have been discussed at length with the patient.  The patient understands the proposed procedure, potential alternative treatments, and the course of recovery to be expected.  All of the patient's questions have been answered at this time.  The patient wishes to proceed with surgery.  +++++++++++++++++++++++++++++++++++++++++++++  You are being scheduled for surgery.  You should hear from our office's scheduling department within 3 business days about the location, date, and time of surgery.  We try to make accommodations for patient's preferences in scheduling surgery, but sometimes the Operating Room's schedule or Dr. Gala Lewandowsky schedule prevents [place] from making those accommodations.  If you have not heard from our office within 3 business days, call the office and ask for Dr. Gala Lewandowsky nurse Claiborne Billings).  [facility] OFFICE: [phone]

## 2021-08-05 NOTE — Patient Instructions (Addendum)
DUE TO COVID-19 ONLY ONE VISITOR IS ALLOWED TO COME WITH YOU AND STAY IN THE WAITING ROOM ONLY DURING PRE OP AND PROCEDURE DAY OF SURGERY. THE 2 VISITORS  MAY VISIT WITH YOU AFTER SURGERY IN YOUR PRIVATE ROOM DURING VISITING HOURS ONLY!                 Curtis Jimenez     Your procedure is scheduled on: 08/18/21   Report to [facility] Main  Entrance   Report to short stay at 5:15 am     Call this number if you have problems the morning of surgery [phone]    Remember: Do not eat food after Midnight.   You may have clear liquids until 4:30 am    CLEAR LIQUID DIET   Foods Allowed                                                                     Foods Excluded  Coffee and tea, regular and decaf                             liquids that you cannot  Plain Jell-O any favor except red or purple                                           see through such as: Fruit ices (not with fruit pulp)                                     milk, soups, orange juice  Iced Popsicles                                    All solid food Carbonated beverages, regular and diet                                    Cranberry, grape and apple juices Sports drinks like Gatorade Lightly seasoned clear broth or consume(fat free) Sugar, honey syrup     BRUSH YOUR TEETH MORNING OF SURGERY AND RINSE YOUR MOUTH OUT, NO CHEWING GUM CANDY OR MINTS.     Take these medicines the morning of surgery with A SIP OF WATER: Carvedilol, Pantoprozole, Euthyrox, Myfortic, Progaf, Bactrum   Stop Plavix  5    days prior to DOS last dose  08/12/21  Ask renal Dr. If ok to take meds on day of surgery      .                               You may not have any metal on your body including              piercings  Do not wear jewelry, lotions, powders or deodorant  Men may shave face and neck.   Do not bring valuables to the hospital. [place].  Contacts, dentures or bridgework may not be worn into surgery.       Patients discharged the day of surgery will not be allowed to drive home.   IF YOU ARE HAVING SURGERY AND GOING HOME THE SAME DAY, YOU MUST HAVE AN ADULT TO DRIVE YOU HOME AND BE WITH YOU FOR 24 HOURS.   YOU MAY GO HOME BY TAXI OR UBER OR ORTHERWISE, BUT AN ADULT MUST ACCOMPANY YOU HOME AND STAY WITH YOU FOR 24 HOURS.  Name and phone number of your driver:  Special Instructions: N/A              Please read over the following fact sheets you were given: _____________________________________________________________________             [facility] - Preparing for Surgery Before surgery, you can play an important role.  Because skin is not sterile, your skin needs to be as free of germs as possible.  You can reduce the number of germs on your skin by washing with CHG (chlorahexidine gluconate) soap before surgery.  CHG is an antiseptic cleaner which kills germs and bonds with the skin to continue killing germs even after washing. Please DO NOT use if you have an allergy to CHG or antibacterial soaps.  If your skin becomes reddened/irritated stop using the CHG and inform your nurse when you arrive at Short Stay. .  You may shave your face/neck.  Please follow these instructions carefully:  1.  Shower with CHG Soap the night before surgery and the  morning of Surgery.  2.  If you choose to wash your hair, wash your hair first as usual with your  normal  shampoo.  3.  After you shampoo, rinse your hair and body thoroughly to remove the  shampoo.                                      4.  Use CHG as you would any other liquid soap.  You can apply chg directly  to the skin and wash                       Gently with a scrungie or clean washcloth.  5.  Apply the CHG Soap to your body ONLY FROM THE NECK DOWN.   Do not use on face/ open                           Wound or open sores. Avoid contact with eyes, ears mouth  and genitals (private parts).                       Wash face,  Genitals (private parts) with your normal soap.             6.  Wash thoroughly, paying special attention to the area where your surgery  will be performed.  7.  Thoroughly rinse your body with warm water from the neck down.  8.  DO NOT shower/wash with your normal soap after using and rinsing off  the CHG Soap.  9.  Pat yourself dry with a clean towel.            10.  Wear clean pajamas.            11.  Place clean sheets on your bed the night of your first shower and do not  sleep with pets. Day of Surgery : Do not apply any lotions/deodorants the morning of surgery.  Please wear clean clothes to the hospital/surgery center.  FAILURE TO FOLLOW THESE INSTRUCTIONS MAY RESULT IN THE CANCELLATION OF YOUR SURGERY PATIENT SIGNATURE_________________________________  NURSE SIGNATURE__________________________________  ________________________________________________________________________

## 2021-08-07 ENCOUNTER — Other Ambulatory Visit: Payer: Self-pay

## 2021-08-07 ENCOUNTER — Encounter (HOSPITAL_COMMUNITY): Payer: Self-pay

## 2021-08-07 ENCOUNTER — Encounter (HOSPITAL_COMMUNITY)
Admission: RE | Admit: 2021-08-07 | Discharge: 2021-08-07 | Disposition: A | Discharge status: Home / Self Care | Payer: Medicare HMO | Source: Ambulatory Visit | Attending: Surgery | Admitting: Surgery

## 2021-08-07 DIAGNOSIS — Z01818 Encounter for other preprocedural examination: Secondary | ICD-10-CM | POA: Insufficient documentation

## 2021-08-07 LAB — CBC
HCT: 43.3 % (ref 39.0–52.0)
Hemoglobin: 13.3 g/dL (ref 13.0–17.0)
MCH: 28.2 pg (ref 26.0–34.0)
MCHC: 30.7 g/dL (ref 30.0–36.0)
MCV: 91.7 fL (ref 80.0–100.0)
Platelets: 214 10*3/uL (ref 150–400)
RBC: 4.72 MIL/uL (ref 4.22–5.81)
RDW: 13.7 % (ref 11.5–15.5)
WBC: 5.2 10*3/uL (ref 4.0–10.5)
nRBC: 0 % (ref 0.0–0.2)

## 2021-08-07 LAB — BASIC METABOLIC PANEL
Anion gap: 6 (ref 5–15)
BUN: 27 mg/dL — ABNORMAL HIGH (ref 8–23)
CO2: 26 mmol/L (ref 22–32)
Calcium: 9.2 mg/dL (ref 8.9–10.3)
Chloride: 110 mmol/L (ref 98–111)
Creatinine, Ser: 1.54 mg/dL — ABNORMAL HIGH (ref 0.61–1.24)
GFR, Estimated: 45 mL/min — ABNORMAL LOW (ref >60)
Glucose, Bld: 106 mg/dL — ABNORMAL HIGH (ref 70–99)
Potassium: 4.2 mmol/L (ref 3.5–5.1)
Sodium: 142 mmol/L (ref 135–145)

## 2021-08-07 NOTE — Progress Notes (Addendum)
COVID Vaccine Completed:Yes Date COVID Vaccine completed:02/01/20-boosters 08/17/20, 04/01/21 COVID vaccine manufacturer: Pfizer     PCP - Dr. Elijah Birk LOV 05/20/21 Cardiologist - Dr. Vita Barley  Chest x-ray - no EKG - 08/07/21-chart Stress Test - no ECHO - 12/01/16-epic Cardiac Cath - no Pacemaker/ICD device last checked:NA  Sleep Study - no CPAP -   Fasting Blood Sugar - NA Checks Blood Sugar _____ times a day  Blood Thinner Instructions:Plavix/ Dr. Anitra Lauth Aspirin Instructions:Stop 5 days prior to DOS/ Dr. Anitra Lauth Last Dose:08/12/21 Pt will call renal Dr. About transplant meds.  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 He has a transplant in 2013.  He has some balance issued from a stroke in 2017. Pt is concerned about the IVP dye used in the cholangiograms. There is one that is safe and one that is not for renal transplant patients. I told him to call his renal Dr.about it.  Patient verbalized understanding of instructions that were given to them at the PAT appointment. Patient was also instructed that they will need to review over the PAT instructions again at home before surgery. yes

## 2021-08-09 ENCOUNTER — Other Ambulatory Visit: Payer: Self-pay | Admitting: Family Medicine

## 2021-08-09 ENCOUNTER — Other Ambulatory Visit: Payer: Self-pay | Admitting: Physician Assistant

## 2021-08-10 ENCOUNTER — Encounter (HOSPITAL_COMMUNITY): Payer: Self-pay | Admitting: Surgery

## 2021-08-10 NOTE — H&P (Signed)
REFERRING PHYSICIAN:  Loralie Champagne, PA   PROVIDER:  Destry Bezdek Charlotta Newton, MD   DOB: 10/13/1936  DATE OF ENCOUNTER: 07/22/2021   Subjective    Chief Complaint: New Consultation (Gallbladder sludge)   History of Present Illness:   Patient is referred by Alonza Bogus, PA-C, on behalf of Dr. Silverio Decamp for surgical evaluation and recommendations regarding gallbladder sludge and possible biliary dyskinesia.  Patient's primary care physician is Dr. Ruby Cola.  Patient had been evaluated in my practice in 2015.  At that time he had abdominal complaints and had been diagnosed with gallbladder polyps.  He never underwent cholecystectomy.  Patient has now had several months of right upper quadrant abdominal discomfort associated with bloating following meals with fatty foods.  His most recent episode lasted up to 11 hours and was accompanied by emesis.  Patient underwent an abdominal ultrasound on June 13, 2021.  This demonstrated a distended gallbladder containing sludge but no stones.  Note was made of a mildly dilated common bile duct.  Subsequent MRCP was performed which showed a normal caliber common bile duct with small sludge in the gallbladder.  Patient was scheduled for a nuclear medicine hepatobiliary scan next week but he canceled that study.  He presents today to discuss his symptoms.  He is accompanied by his wife.   Past abdominal surgery includes a kidney transplant performed at [facility] in 2013.  Patient denies any history of jaundice or acholic stools.  He denies any fevers or chills associated with these episodes.  There is no family history of gallbladder disease.     Review of Systems: A complete review of systems was obtained from the patient.  I have reviewed this information and discussed as appropriate with the patient.  See HPI as well for other ROS.   Review of Systems  Constitutional: Negative.   HENT: Negative.   Eyes: Negative.   Respiratory: Negative.    Cardiovascular: Negative.   Gastrointestinal: Positive for abdominal pain.  Genitourinary: Negative.   Musculoskeletal: Negative.   Skin: Negative.   Neurological: Negative.   Endo/Heme/Allergies: Negative.   Psychiatric/Behavioral: Negative.         Medical History: Past Medical History      Past Medical History:  Diagnosis Date   Abdominal pain, RUQ (right upper quadrant) 07/22/2021   Chronic kidney disease     Gallbladder sludge 07/22/2021   GERD (gastroesophageal reflux disease)     History of stroke     Hypertension     Thyroid disease             Patient Active Problem List  Diagnosis   Abdominal pain, RUQ (right upper quadrant)   Gallbladder sludge      Past Surgical History       Past Surgical History:  Procedure Laterality Date   TRANSPLANT KIDNEY            Allergies       Allergies  Allergen Reactions   Amantadine Itching      REACTION: itching REACTION: itching REACTION: itching REACTION: itching                Current Outpatient Medications on File Prior to Visit  Medication Sig Dispense Refill   acetaminophen (TYLENOL) 325 MG tablet Take 1 tablet by mouth 4 (four) times daily as needed       allopurinoL (ZYLOPRIM) 300 MG tablet Take 1 tablet by mouth once daily  carvediloL (COREG) 12.5 MG tablet Take 0.5 tablets by mouth 2 (two) times daily       cholecalciferol (VITAMIN D3) 2,000 unit tablet Take 1 tablet by mouth once daily       clopidogreL (PLAVIX) 75 mg tablet Take 1 tablet by mouth once daily       cyanocobalamin (VITAMIN B12) 500 MCG tablet Take 1 tablet by mouth once daily       levothyroxine (SYNTHROID) 25 MCG tablet Take 1 tablet by mouth once daily       mycophenolate (MYFORTIC) 180 MG DR tablet Take 2 tablets by mouth 2 (two) times daily       omega-3 acid ethyl esters (LOVAZA) 1 gram capsule Take 1 capsule by mouth 2 (two) times daily       pantoprazole (PROTONIX) 40 MG DR tablet Take 1 tablet by mouth once daily        risperiDONE (RISPERDAL) 2 MG tablet TAKE ONE-HALF TABLET BY MOUTH AT BEDTIME FOR MENTAL HEALTH       simvastatin (ZOCOR) 20 MG tablet Take 1 tablet by mouth nightly       tacrolimus (PROGRAF) 0.5 MG capsule TAKE TWO CAPSULES BY MOUTH EVERY MORNING AND TAKE ONE CAPSULE EVERY EVENING       omega-3 fatty acids-fish oil 300-1,000 mg capsule Take by mouth once daily        No current facility-administered medications on file prior to visit.      Family History       Family History  Problem Relation Age of Onset   Coronary Artery Disease (Blocked arteries around heart) Father          Social History        Tobacco Use  Smoking Status Former Smoker   Types: Cigarettes  Smokeless Tobacco Never Used      Social History  Social History         Socioeconomic History   Marital status: Married  Tobacco Use   Smoking status: Former Smoker      Types: Cigarettes   Smokeless tobacco: Never Used  Scientific laboratory technician Use: Never used  Substance and Sexual Activity   Alcohol use: Never   Drug use: Never   Sexual activity: Defer        Objective:         Vitals:    07/22/21 1542  BP: 120/62  Pulse: 84  Temp: 36.7 C (98 F)  SpO2: 98%  Weight: 81.8 kg (180 lb 6.4 oz)  Height: 175.3 cm (5\' 9" )    Body mass index is 26.64 kg/m.   Physical Exam    GENERAL APPEARANCE Development: normal Nutritional status: normal Gross deformities: none   SKIN Rash, lesions, ulcers: none Induration, erythema: none Nodules: none palpable   EYES Conjunctiva and lids: normal Pupils: equal and reactive Iris: normal bilaterally   EARS, NOSE, MOUTH, THROAT External ears: no lesion or deformity External nose: no lesion or deformity Hearing: grossly normal Due to Covid-19 pandemic, patient is wearing a mask.   NECK Symmetric: yes Trachea: midline Thyroid: no palpable nodules in the thyroid bed   CHEST Respiratory effort: normal Retraction or accessory muscle use: no Breath  sounds: normal bilaterally Rales, rhonchi, wheeze: none   CARDIOVASCULAR Auscultation: regular rhythm, normal rate Murmurs: none Pulses: radial pulse 2+ palpable Lower extremity edema: none   ABDOMEN Distension: none Masses: none palpable Tenderness: none Hepatosplenomegaly: not present Hernia: not present   MUSCULOSKELETAL Station and gait:  normal Digits and nails: no clubbing or cyanosis Muscle strength: grossly normal all extremities Range of motion: grossly normal all extremities Deformity: none   LYMPHATIC Cervical: none palpable Supraclavicular: none palpable   PSYCHIATRIC Oriented to person, place, and time: yes Mood and affect: normal for situation Judgment and insight: appropriate for situation Slightly slowed speech       Assessment and Plan:  Diagnoses and all orders for this visit:   Abdominal pain, RUQ (right upper quadrant)   Gallbladder sludge   Patient is referred by Alonza Bogus for consideration for cholecystectomy for management of gallbladder sludge and intermittent right upper quadrant abdominal pain following fatty meals.  I provided the patient today with written literature on gallbladder surgery to review at home.  We discussed the results of his ultrasound and MRCP.  We discussed the fact that he does have a small amount of gallbladder sludge but no gallstones.  We discussed proceeding with a nuclear medicine hepatobiliary scan in order to evaluate for biliary dyskinesia and to see if symptoms were reproduced at the time of administration of cholecystokinin or a fatty meal.  Patient and his wife agreed to proceed with this study.  If the patient is symptomatic or if the study is abnormal, then I believe he would benefit from cholecystectomy.  If the study is absolutely normal with no symptoms, then I think we need to reconsider whether or not we should proceed with cholecystectomy.  Patient and his wife are in agreement with this plan.   Today we  discussed laparoscopic cholecystectomy.  We discussed the risk and benefits of the procedure.  I provided them with written literature on gallbladder surgery to review at home.  If the nuclear medicine hepatobiliary study is abnormal, then we will plan to proceed with cholecystectomy in the near future at [facility].   I have encouraged the patient to sign up for my chart.  We will communicate the results of his nuclear medicine scan through [place].   [facility] Office: [phone]

## 2021-08-11 NOTE — Anesthesia Preprocedure Evaluation (Addendum)
Anesthesia Evaluation  Patient identified by MRN, date of birth, ID band Patient awake    Reviewed: Allergy & Precautions, NPO status , Patient's Chart, lab work & pertinent test results  Airway Mallampati: II  TM Distance: >3 FB Neck ROM: Full    Dental no notable dental hx. (+) Dental Advisory Given, Teeth Intact   Pulmonary former smoker,    Pulmonary exam normal breath sounds clear to auscultation       Cardiovascular hypertension, Pt. on medications + CAD  Normal cardiovascular exam Rhythm:Regular Rate:Normal   EKG RBB w LVH   Neuro/Psych CVA (balance), Residual Symptoms    GI/Hepatic Neg liver ROS, GERD  ,  Endo/Other  Hypothyroidism   Renal/GU S/P kidney transplant 2013     Musculoskeletal   Abdominal   Peds  Hematology Lab Results      Component                Value               Date                      WBC                      5.2                 08/07/2021                HGB                      13.3                08/07/2021                HCT                      43.3                08/07/2021                MCV                      91.7                08/07/2021                PLT                      214                 08/07/2021              Anesthesia Other Findings All: Amanaidine, Chlorpromazine  Reproductive/Obstetrics                          Anesthesia Physical Anesthesia Plan  ASA: 3  Anesthesia Plan: General   Post-op Pain Management:    Induction: Intravenous  PONV Risk Score and Plan: 3 and Treatment may vary due to age or medical condition and Ondansetron  Airway Management Planned: Oral ETT  Additional Equipment: None  Intra-op Plan:   Post-operative Plan: Extubation in OR  Informed Consent: I have reviewed the patients History and Physical, chart, labs and discussed the procedure including the risks, benefits and alternatives for the proposed  anesthesia with the patient or authorized representative who has indicated  his/her understanding and acceptance.     Dental advisory given  Plan Discussed with: CRNA  Anesthesia Plan Comments: (See APP note by Durel Salts, FNP )      Anesthesia Quick Evaluation

## 2021-08-11 NOTE — Progress Notes (Signed)
Anesthesia Chart Review:   Case: [id number] Date/Time: 08/18/21 0715   Procedure: LAPAROSCOPIC CHOLECYSTECTOMY WITH CHOLANGIOGRAM   Anesthesia type: General   Pre-op diagnosis: CHRONIC CHOLECYSTITIS   Location: [facility] ROOM 04 / [facility]   Surgeons: Armandina Gemma, MD       DISCUSSION: Curtis Jimenez is 85 years old with hx stroke (2017), HTN, CKD (s/p cadaver renal transplant 2013)  VS: BP (!) 106/52   Pulse 72   Temp (!) 36.4 C (Oral)   Resp 20   Ht 5\' 9"  (1.753 m)   Wt 80.7 kg   SpO2 97%   BMI 26.29 kg/m   PROVIDERS: - PCP is Tammi Sou, MD. Last office visit 05/20/21 - Saw cardiologist Minus Breeding, MD 01/26/20 for PVCs and elevated coronary calcium. PRN f/u recommended - Nephrologist is Corliss Parish, MD   LABS: Labs reviewed: Acceptable for surgery. (all labs ordered are listed, but only abnormal results are displayed)  Labs Reviewed  BASIC METABOLIC PANEL - Abnormal; Notable for the following components:      Result Value   Glucose, Bld 106 (*)    BUN 27 (*)    Creatinine, Ser 1.54 (*)    GFR, Estimated 45 (*)    All other components within normal limits  CBC     IMAGES: Renal US 06/13/20:  1. Native kidney medical renal disease, atrophy with benign appearing polycystic disease. The right upper pole lesion in question on the CT appears to be a nonvascular cyst. 2. Right lower quadrant renal transplant with benign cyst and no hydronephrosis   EKG 08/07/21: NSR. LAD. RBBB. Minimal voltage criteria for LVH, may be normal variant ( R in aVL )   CV: Echo 12/01/16:  - Left ventricle: The cavity size was mildly dilated. Systolic function was normal. The estimated ejection fraction was in the range of 55% to 60%. Wall motion was normal; there were no regional wall motion abnormalities. There was an increased relative contribution of atrial contraction to ventricular filling. Doppler parameters are consistent with abnormal left ventricular relaxation (grade 1 diastolic  dysfunction).  - Aorta: Aortic root dimension: 38 mm (ED).  - Aortic root: The aortic root was mildly dilated.  - Mitral valve: There was mild regurgitation.  Carotid duplex 12/01/16:  - Findings are consistent with a 1-39 percent stenosis involving the right internal carotid artery and the left internal carotid artery.  - The vertebral arteries demonstrate antegrade flow.   Past Medical History:  Diagnosis Date   Basal cell carcinoma    neck (skin MD 2X per year)   Bifascicular block 2021   ectopy, asymptomatic.  Cards->observe   Bipolar disorder (HCC)    BPH (benign prostatic hyperplasia)    with elevated PSA; followed by Dr. Rosana Hoes at [facility]   Chronic renal insufficiency, stage III (moderate) ([place])    in Curtis Jimenez w/hx of renal transplant.  Post-transplant sCr 1.4-1.6.  Last renal f/u 05/01/19->Cr 1.63, GFR 39 ml/min   Coronary atherosclerosis    LM and 3 V dz noted on CT 08/2019 performed for eval of interstitial lung dz in the setting of mild DOE and mild hypoxia. Cards->no stress tesing indicated->primary prevention emphasized   CVA (cerebral vascular accident) ([place]) 11/2016   Pontine (vertebrobasilar--imaging neg), TPA given.  Curtis Jimenez discharged on plavix.  Carotid dopplers ok, echocardiogram ok.  Residual deficit: vertigo but this improved greatly with therapy.   DOE (dyspnea on exertion) 2020   DOE and ? hypoxia->CXR 08/29/19--->diffuse interstitial opacities-? acute inflammatory  process->Dr. Melvyn Novas eval'd him and was underwhelmed by the CXR and exam->CT chest 09/26/19 "Very subtle areas of mild ground-glass attenuatio-nonspecific", mild air trapping. Per Dr. Melvyn Novas, no signif ILD, improving with inc ambulation (post-inflamm pulm fibrosis?)   Esophagitis 06/14/2020   EGD->+distal esophagitis, h pylori neg, mild distal esoph stenosis widened with forceps, benign gastric polyps.   Gallbladder polyp 2015   Asymptomatic   Gallbladder sludge 2022   Hepatobiliary scan abnl, cystic duct  obstructed-->plan is for cholecystectomy.   GERD (gastroesophageal reflux disease)    Gout    History of adenomatous polyp of colon 2018   Recall 5 yrs   Hyperlipidemia    Hypertension    Hypothyroidism    Lumbar spondylosis    Ptosis due to aging    Right   Rectal bleeding 09/2017   Admitted for obs due to Hb drop.  GI consulted---obs recommended.  No transfusion required.  Plavix was d/c'd, ferrous sulfate recommended.  Outpt GI f/u--plavix restarted and upper endoscopy was normal and colonoscopy showed 2 polyps and severe diverticular dz. Iron d/c'd 04/24/19.   Renal cyst    per Curtis Jimenez he got MRI kidneys at the [facility] 12/2020 and the images will be viewed by Dr. Clover Mealy   Restless legs syndrome    S/p cadaver renal transplant 2013   Secondary to HTN +lithium toxicity over 30 yrs caused kidney destruction [facility] transplant MDs)   Seborrheic dermatitis    eyebrows worst: Hytone rx'd by Dr. Denna Haggard.   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   left jawline(CX35FU), Left forehead (CX35FU) right shoulder (CX35FU), R forearm    Past Surgical History:  Procedure Laterality Date   AV FISTULA PLACEMENT  04/08/2012   Procedure: ARTERIOVENOUS (AV) FISTULA CREATION;  Surgeon: Angelia Mould, MD;  Location: [facility];  Service: Vascular;  Laterality: Left;  Creation of left brachial cephalic arteriovenous fistula   BIOPSY  06/14/2020   Procedure: BIOPSY;  Surgeon: Lavena Bullion, DO;  [facility];  Service: Gastroenterology;;   CATARACT EXTRACTION, BILATERAL  05/2018   COLONOSCOPY  2013; 11/24/17   2013; Normal except diverticulosis (recall 10 yrs).  2018 (for GI bleeding)--severe diverticular dz + 2 adenomatous polyps.  Recall 5 yrs.   DG CHEST AP OR PA ([facility] HX)  06/03/2020   Bibasilar atelectasis. No focal consoliadation definitively identified.   dilation for GERD     ESOPHAGOGASTRODUODENOSCOPY  11/24/2017   gastric polyps x 2, otherwise normal.    ESOPHAGOGASTRODUODENOSCOPY N/A 06/14/2020   +distal esophagitis, h pylori neg, mild distal esoph stenosis widened with forceps, benign gastric polypsProcedure: ESOPHAGOGASTRODUODENOSCOPY (EGD);  Surgeon: Lavena Bullion, DO;  [facility];  Service: Gastroenterology;  Laterality: N/A;  Diagnostic EGD as patietn last took Plavix 11am on 06/12/2020   KIDNEY TRANSPLANT  11/06/12   [place] (cadaveric)   PROSTATE BIOPSY  2011   no malignancy   TRANSTHORACIC ECHOCARDIOGRAM  11/2016   Normal LV systolic fxn, EF 44-92%.  Grade I DD.  Mild aortic root dilatation, mild MV regurg.    MEDICATIONS:  acetaminophen (TYLENOL) 325 MG tablet   allopurinol (ZYLOPRIM) 300 MG tablet   carvedilol (COREG) 12.5 MG tablet   clopidogrel (PLAVIX) 75 MG tablet   colchicine 0.6 MG tablet   EUTHYROX 25 MCG tablet   hydrOXYzine (ATARAX/VISTARIL) 25 MG tablet   melatonin 5 MG TABS   Multiple Vitamin (MULTIVITAMIN) tablet   mycophenolate (MYFORTIC) 180 MG EC tablet   Omega-3 Fatty Acids (FISH OIL  PO)   pantoprazole (PROTONIX) 40 MG tablet   risperiDONE (RISPERDAL) 1 MG tablet   simvastatin (ZOCOR) 20 MG tablet   sulfamethoxazole-trimethoprim (BACTRIM,SEPTRA) 400-80 MG tablet   tacrolimus (PROGRAF) 0.5 MG capsule   No current facility-administered medications for this encounter.    If no changes, I anticipate Curtis Jimenez can proceed with surgery as scheduled.   Willeen Cass, PhD, FNP-BC [facility]/Anesthesiology Phone: [phone] 08/11/2021 11:14 AM

## 2021-08-13 ENCOUNTER — Encounter (HOSPITAL_COMMUNITY): Payer: Self-pay | Admitting: Surgery

## 2021-08-18 ENCOUNTER — Ambulatory Visit (HOSPITAL_COMMUNITY)
Admission: RE | Admit: 2021-08-18 | Discharge: 2021-08-18 | Disposition: A | Discharge status: Home / Self Care | Payer: Medicare HMO | Attending: Surgery | Admitting: Surgery

## 2021-08-18 ENCOUNTER — Encounter (HOSPITAL_COMMUNITY): Payer: Self-pay | Admitting: Surgery

## 2021-08-18 ENCOUNTER — Encounter (HOSPITAL_COMMUNITY)
Admission: RE | Disposition: A | Discharge status: Home / Self Care | Payer: Self-pay | Source: Home / Self Care | Attending: Surgery

## 2021-08-18 ENCOUNTER — Other Ambulatory Visit: Payer: Self-pay

## 2021-08-18 ENCOUNTER — Ambulatory Visit (HOSPITAL_COMMUNITY): Payer: Medicare HMO | Admitting: Emergency Medicine

## 2021-08-18 ENCOUNTER — Ambulatory Visit (HOSPITAL_COMMUNITY): Payer: Medicare HMO | Admitting: Anesthesiology

## 2021-08-18 DIAGNOSIS — K811 Chronic cholecystitis: Secondary | ICD-10-CM | POA: Diagnosis present

## 2021-08-18 DIAGNOSIS — Z888 Allergy status to other drugs, medicaments and biological substances status: Secondary | ICD-10-CM | POA: Insufficient documentation

## 2021-08-18 DIAGNOSIS — Z7902 Long term (current) use of antithrombotics/antiplatelets: Secondary | ICD-10-CM | POA: Insufficient documentation

## 2021-08-18 DIAGNOSIS — Z79890 Hormone replacement therapy (postmenopausal): Secondary | ICD-10-CM | POA: Diagnosis not present

## 2021-08-18 DIAGNOSIS — K824 Cholesterolosis of gallbladder: Secondary | ICD-10-CM | POA: Diagnosis not present

## 2021-08-18 DIAGNOSIS — Z20822 Contact with and (suspected) exposure to covid-19: Secondary | ICD-10-CM | POA: Diagnosis not present

## 2021-08-18 DIAGNOSIS — E039 Hypothyroidism, unspecified: Secondary | ICD-10-CM | POA: Diagnosis not present

## 2021-08-18 DIAGNOSIS — Z940 Kidney transplant status: Secondary | ICD-10-CM | POA: Insufficient documentation

## 2021-08-18 DIAGNOSIS — K828 Other specified diseases of gallbladder: Secondary | ICD-10-CM

## 2021-08-18 DIAGNOSIS — Z79899 Other long term (current) drug therapy: Secondary | ICD-10-CM | POA: Insufficient documentation

## 2021-08-18 DIAGNOSIS — R1011 Right upper quadrant pain: Secondary | ICD-10-CM | POA: Diagnosis present

## 2021-08-18 DIAGNOSIS — I10 Essential (primary) hypertension: Secondary | ICD-10-CM | POA: Diagnosis not present

## 2021-08-18 HISTORY — PX: CHOLECYSTECTOMY: SHX55

## 2021-08-18 LAB — SARS CORONAVIRUS 2 BY RT PCR (HOSPITAL ORDER, PERFORMED IN ~~LOC~~ HOSPITAL LAB): SARS Coronavirus 2: NEGATIVE

## 2021-08-18 SURGERY — LAPAROSCOPIC CHOLECYSTECTOMY
Anesthesia: General | Site: Abdomen

## 2021-08-18 MED ORDER — ONDANSETRON HCL 4 MG/2ML IJ SOLN
INTRAMUSCULAR | Status: DC | PRN
  Administered 2021-08-18: 4 mg via INTRAVENOUS

## 2021-08-18 MED ORDER — ROCURONIUM BROMIDE 10 MG/ML (PF) SYRINGE
PREFILLED_SYRINGE | INTRAVENOUS | Status: DC | PRN
  Administered 2021-08-18: 50 mg via INTRAVENOUS

## 2021-08-18 MED ORDER — PROPOFOL 10 MG/ML IV BOLUS
INTRAVENOUS | Status: AC
  Filled 2021-08-18: qty 20

## 2021-08-18 MED ORDER — ORAL CARE MOUTH RINSE: 15.0000 mL | Freq: Once | OROMUCOSAL | Status: AC

## 2021-08-18 MED ORDER — LACTATED RINGERS IV SOLN: INTRAVENOUS | Status: DC

## 2021-08-18 MED ORDER — FENTANYL CITRATE (PF) 100 MCG/2ML IJ SOLN: 25.0000 ug | INTRAMUSCULAR | Status: DC | PRN

## 2021-08-18 MED ORDER — MELATONIN 5 MG PO TABS: 10.0000 mg | ORAL_TABLET | Freq: Every evening | ORAL | Status: DC | PRN

## 2021-08-18 MED ORDER — CHLORHEXIDINE GLUCONATE CLOTH 2 % EX PADS
6.0000 | MEDICATED_PAD | Freq: Once | CUTANEOUS | Status: DC
Start: 2021-08-18 — End: 2021-08-18

## 2021-08-18 MED ORDER — TACROLIMUS 1 MG PO CAPS
1.0000 mg | ORAL_CAPSULE | Freq: Every day | ORAL | Status: DC
  Administered 2021-08-18: 1 mg via ORAL
  Filled 2021-08-18: qty 1

## 2021-08-18 MED ORDER — DEXAMETHASONE SODIUM PHOSPHATE 10 MG/ML IJ SOLN
INTRAMUSCULAR | Status: AC
  Filled 2021-08-18: qty 1

## 2021-08-18 MED ORDER — ONDANSETRON HCL 4 MG/2ML IJ SOLN: 4.0000 mg | Freq: Four times a day (QID) | INTRAMUSCULAR | Status: DC | PRN

## 2021-08-18 MED ORDER — ACETAMINOPHEN 650 MG RE SUPP: 650.0000 mg | Freq: Four times a day (QID) | RECTAL | Status: DC | PRN

## 2021-08-18 MED ORDER — TRAMADOL HCL 50 MG PO TABS: 50.0000 mg | ORAL_TABLET | Freq: Four times a day (QID) | ORAL | 0 refills | Status: DC | PRN

## 2021-08-18 MED ORDER — OXYCODONE HCL 5 MG PO TABS: 5.0000 mg | ORAL_TABLET | ORAL | Status: DC | PRN

## 2021-08-18 MED ORDER — CHLORHEXIDINE GLUCONATE CLOTH 2 % EX PADS: 6.0000 | MEDICATED_PAD | Freq: Once | CUTANEOUS | Status: DC

## 2021-08-18 MED ORDER — EPHEDRINE 5 MG/ML INJ
INTRAVENOUS | Status: AC
  Filled 2021-08-18: qty 5

## 2021-08-18 MED ORDER — ACETAMINOPHEN 325 MG PO TABS: 650.0000 mg | ORAL_TABLET | Freq: Four times a day (QID) | ORAL | Status: DC | PRN

## 2021-08-18 MED ORDER — FENTANYL CITRATE (PF) 250 MCG/5ML IJ SOLN
INTRAMUSCULAR | Status: AC
  Filled 2021-08-18: qty 5

## 2021-08-18 MED ORDER — BUPIVACAINE-EPINEPHRINE (PF) 0.5% -1:200000 IJ SOLN
INTRAMUSCULAR | Status: AC
  Filled 2021-08-18: qty 30

## 2021-08-18 MED ORDER — LIDOCAINE 2% (20 MG/ML) 5 ML SYRINGE
INTRAMUSCULAR | Status: DC | PRN
  Administered 2021-08-18: 100 mg via INTRAVENOUS

## 2021-08-18 MED ORDER — BUPIVACAINE-EPINEPHRINE 0.5% -1:200000 IJ SOLN
INTRAMUSCULAR | Status: DC | PRN
  Administered 2021-08-18: 30 mL

## 2021-08-18 MED ORDER — CEFAZOLIN SODIUM-DEXTROSE 2-4 GM/100ML-% IV SOLN
2.0000 g | INTRAVENOUS | Status: AC
  Administered 2021-08-18: 2 g via INTRAVENOUS
  Filled 2021-08-18: qty 100

## 2021-08-18 MED ORDER — SULFAMETHOXAZOLE-TRIMETHOPRIM 400-80 MG PO TABS
1.0000 | ORAL_TABLET | ORAL | Status: DC
  Administered 2021-08-18: 1 via ORAL
  Filled 2021-08-18: qty 1

## 2021-08-18 MED ORDER — PROPOFOL 10 MG/ML IV BOLUS
INTRAVENOUS | Status: DC | PRN
  Administered 2021-08-18: 80 mg via INTRAVENOUS

## 2021-08-18 MED ORDER — ONDANSETRON HCL 4 MG/2ML IJ SOLN: 4.0000 mg | Freq: Once | INTRAMUSCULAR | Status: DC | PRN

## 2021-08-18 MED ORDER — EPHEDRINE SULFATE-NACL 50-0.9 MG/10ML-% IV SOSY
PREFILLED_SYRINGE | INTRAVENOUS | Status: DC | PRN
  Administered 2021-08-18: 10 mg via INTRAVENOUS

## 2021-08-18 MED ORDER — TRAMADOL HCL 50 MG PO TABS: 50.0000 mg | ORAL_TABLET | Freq: Four times a day (QID) | ORAL | Status: DC | PRN

## 2021-08-18 MED ORDER — MYCOPHENOLATE SODIUM 180 MG PO TBEC
360.0000 mg | DELAYED_RELEASE_TABLET | Freq: Two times a day (BID) | ORAL | Status: DC
  Administered 2021-08-18: 360 mg via ORAL
  Filled 2021-08-18 (×2): qty 2

## 2021-08-18 MED ORDER — CHLORHEXIDINE GLUCONATE 0.12 % MT SOLN
15.0000 mL | Freq: Once | OROMUCOSAL | Status: AC
  Administered 2021-08-18: 15 mL via OROMUCOSAL

## 2021-08-18 MED ORDER — FENTANYL CITRATE (PF) 250 MCG/5ML IJ SOLN
INTRAMUSCULAR | Status: DC | PRN
  Administered 2021-08-18: 50 ug via INTRAVENOUS

## 2021-08-18 MED ORDER — PANTOPRAZOLE SODIUM 40 MG PO TBEC: 40.0000 mg | DELAYED_RELEASE_TABLET | Freq: Two times a day (BID) | ORAL | Status: DC

## 2021-08-18 MED ORDER — RISPERIDONE 1 MG PO TABS: 1.0000 mg | ORAL_TABLET | Freq: Every day | ORAL | Status: DC

## 2021-08-18 MED ORDER — ONDANSETRON HCL 4 MG/2ML IJ SOLN
INTRAMUSCULAR | Status: AC
  Filled 2021-08-18: qty 2

## 2021-08-18 MED ORDER — HYDROXYZINE HCL 25 MG PO TABS
25.0000 mg | ORAL_TABLET | Freq: Every day | ORAL | Status: DC
  Administered 2021-08-18: 25 mg via ORAL
  Filled 2021-08-18: qty 1

## 2021-08-18 MED ORDER — CARVEDILOL 6.25 MG PO TABS: 6.2500 mg | ORAL_TABLET | Freq: Two times a day (BID) | ORAL | Status: DC

## 2021-08-18 MED ORDER — ROCURONIUM BROMIDE 10 MG/ML (PF) SYRINGE
PREFILLED_SYRINGE | INTRAVENOUS | Status: AC
  Filled 2021-08-18: qty 10

## 2021-08-18 MED ORDER — DEXMEDETOMIDINE (PRECEDEX) IN NS 20 MCG/5ML (4 MCG/ML) IV SYRINGE
PREFILLED_SYRINGE | INTRAVENOUS | Status: AC
  Filled 2021-08-18: qty 5

## 2021-08-18 MED ORDER — ACETAMINOPHEN 10 MG/ML IV SOLN: 1000.0000 mg | Freq: Once | INTRAVENOUS | Status: DC | PRN

## 2021-08-18 MED ORDER — SODIUM CHLORIDE 0.45 % IV SOLN: INTRAVENOUS | Status: DC

## 2021-08-18 MED ORDER — SODIUM CHLORIDE 0.9 % IV SOLN: INTRAVENOUS | Status: DC

## 2021-08-18 MED ORDER — TACROLIMUS 0.5 MG PO CAPS: 0.5000 mg | ORAL_CAPSULE | ORAL | Status: DC

## 2021-08-18 MED ORDER — ALLOPURINOL 300 MG PO TABS
300.0000 mg | ORAL_TABLET | Freq: Every day | ORAL | Status: DC
  Administered 2021-08-18: 300 mg via ORAL
  Filled 2021-08-18: qty 1

## 2021-08-18 MED ORDER — ONDANSETRON 4 MG PO TBDP: 4.0000 mg | ORAL_TABLET | Freq: Four times a day (QID) | ORAL | Status: DC | PRN

## 2021-08-18 MED ORDER — DEXAMETHASONE SODIUM PHOSPHATE 10 MG/ML IJ SOLN
INTRAMUSCULAR | Status: DC | PRN
  Administered 2021-08-18: 8 mg via INTRAVENOUS

## 2021-08-18 MED ORDER — TACROLIMUS 0.5 MG PO CAPS
0.5000 mg | ORAL_CAPSULE | Freq: Every day | ORAL | Status: DC
  Filled 2021-08-18: qty 1

## 2021-08-18 MED ORDER — SUGAMMADEX SODIUM 200 MG/2ML IV SOLN
INTRAVENOUS | Status: DC | PRN
  Administered 2021-08-18: 160 mg via INTRAVENOUS

## 2021-08-18 MED ORDER — MELATONIN 5 MG PO TABS: 25.0000 mg | ORAL_TABLET | Freq: Every evening | ORAL | Status: DC | PRN

## 2021-08-18 MED ORDER — LIDOCAINE 2% (20 MG/ML) 5 ML SYRINGE
INTRAMUSCULAR | Status: AC
  Filled 2021-08-18: qty 5

## 2021-08-18 MED ORDER — HYDROMORPHONE HCL 1 MG/ML IJ SOLN
1.0000 mg | INTRAMUSCULAR | Status: DC | PRN
Start: 2021-08-18 — End: 2021-08-18

## 2021-08-18 MED ORDER — DEXMEDETOMIDINE (PRECEDEX) IN NS 20 MCG/5ML (4 MCG/ML) IV SYRINGE
PREFILLED_SYRINGE | INTRAVENOUS | Status: DC | PRN
  Administered 2021-08-18 (×2): 4 ug via INTRAVENOUS

## 2021-08-18 SURGICAL SUPPLY — 35 items
APPLIER CLIP ROT 10 11.4 M/L (STAPLE) ×2
BAG COUNTER SPONGE SURGICOUNT (BAG) IMPLANT
CABLE HIGH FREQUENCY MONO STRZ (ELECTRODE) ×2 IMPLANT
CHLORAPREP W/TINT 26 (MISCELLANEOUS) ×2 IMPLANT
CLIP APPLIE ROT 10 11.4 M/L (STAPLE) ×1 IMPLANT
COVER MAYO STAND STRL (DRAPES) IMPLANT
COVER SURGICAL LIGHT HANDLE (MISCELLANEOUS) ×2 IMPLANT
DECANTER SPIKE VIAL GLASS SM (MISCELLANEOUS) ×2 IMPLANT
DERMABOND ADVANCED (GAUZE/BANDAGES/DRESSINGS) ×1
DERMABOND ADVANCED .7 DNX12 (GAUZE/BANDAGES/DRESSINGS) ×1 IMPLANT
DRAPE C-ARM 42X120 X-RAY (DRAPES) IMPLANT
ELECT REM PT RETURN 15FT ADLT (MISCELLANEOUS) ×2 IMPLANT
GLOVE SURG ORTHO LTX SZ8 (GLOVE) ×2 IMPLANT
GLOVE SURG SYN 7.5  E (GLOVE) ×2
GLOVE SURG SYN 7.5 E (GLOVE) ×1 IMPLANT
GOWN STRL REUS W/TWL XL LVL3 (GOWN DISPOSABLE) ×4 IMPLANT
HEMOSTAT SURGICEL 4X8 (HEMOSTASIS) IMPLANT
KIT BASIN OR (CUSTOM PROCEDURE TRAY) ×2 IMPLANT
KIT TURNOVER KIT A (KITS) ×2 IMPLANT
PAD POSITIONING PINK XL (MISCELLANEOUS) IMPLANT
PENCIL SMOKE EVACUATOR (MISCELLANEOUS) IMPLANT
POUCH SPECIMEN RETRIEVAL 10MM (ENDOMECHANICALS) ×2 IMPLANT
SCISSORS LAP 5X35 DISP (ENDOMECHANICALS) ×2 IMPLANT
SET CHOLANGIOGRAPH MIX (MISCELLANEOUS) IMPLANT
SET IRRIG TUBING LAPAROSCOPIC (IRRIGATION / IRRIGATOR) ×2 IMPLANT
SET TUBE SMOKE EVAC HIGH FLOW (TUBING) ×2 IMPLANT
SLEEVE XCEL OPT CAN 5 100 (ENDOMECHANICALS) ×2 IMPLANT
STRIP CLOSURE SKIN 1/2X4 (GAUZE/BANDAGES/DRESSINGS) ×2 IMPLANT
SUT VIC AB 4-0 PS2 27 (SUTURE) IMPLANT
TAPE CLOTH 4X10 WHT NS (GAUZE/BANDAGES/DRESSINGS) IMPLANT
TOWEL OR 17X26 10 PK STRL BLUE (TOWEL DISPOSABLE) ×2 IMPLANT
TRAY LAPAROSCOPIC (CUSTOM PROCEDURE TRAY) ×2 IMPLANT
TROCAR BLADELESS OPT 5 100 (ENDOMECHANICALS) ×2 IMPLANT
TROCAR XCEL BLUNT TIP 100MML (ENDOMECHANICALS) ×2 IMPLANT
TROCAR XCEL NON-BLD 11X100MML (ENDOMECHANICALS) ×2 IMPLANT

## 2021-08-18 NOTE — Interval H&P Note (Signed)
History and Physical Interval Note:  08/18/2021 6:59 AM  Curtis Jimenez  has presented today for surgery, with the diagnosis of CHRONIC CHOLECYSTITIS.  The various methods of treatment have been discussed with the patient and family. After consideration of risks, benefits and other options for treatment, the patient has consented to    Procedure(s): LAPAROSCOPIC CHOLECYSTECTOMY WITH CHOLANGIOGRAM (N/A) as a surgical intervention.    [name] history has been reviewed, patient examined, no change in status, stable for surgery.  I have reviewed the patient's chart and labs.  Questions were answered to the patient's satisfaction.    Armandina Gemma, [facility] [place] [facility] Office: [place]

## 2021-08-18 NOTE — Transfer of Care (Signed)
Immediate Anesthesia Transfer of Care Note  Patient: Curtis Jimenez  Procedure(s) Performed: LAPAROSCOPIC CHOLECYSTECTOMY WITH CHOLANGIOGRAM (Abdomen)  Patient Location: PACU  Anesthesia Type:General  Level of Consciousness: awake, alert  and oriented  Airway & Oxygen Therapy: Patient Spontanous Breathing and Patient connected to face mask oxygen  Post-op Assessment: Report given to RN, Post -op Vital signs reviewed and stable and Patient moving all extremities X 4  Post vital signs: Reviewed and stable  Last Vitals:  Vitals Value Taken Time  BP 152/75 08/18/21 0842  Temp    Pulse 65 08/18/21 0843  Resp 14 08/18/21 0843  SpO2 93 % 08/18/21 0843  Vitals shown include unvalidated device data.  Last Pain:  Vitals:   08/18/21 0623  TempSrc: Oral  PainSc:          Complications: No notable events documented.

## 2021-08-18 NOTE — Progress Notes (Signed)
Pharmacy Brief Note -   Confirmed the patients transplant medication regimen with Dr. Candiss Norse (nephrology).   Myfortic 360 mg PO BID Tacrolimus 1 mg PO in the morning; 0.5 mg PO in the evening  Curtis Jimenez, PharmD 08/18/21 1:11 PM

## 2021-08-18 NOTE — Op Note (Signed)
Operative Note  Pre-operative Diagnosis:  chronic cholecystitis, gallbladder polyps, gallbladder sludge  Post-operative Diagnosis:  same  Surgeon:  Armandina Gemma, MD  Assistant:  none   Procedure:  laparoscopic cholecystectomy  Anesthesia:  general  Estimated Blood Loss:  25 cc  Drains: none         Specimen: gallbladder to pathology  Indications:  Patient is referred by Alonza Bogus, PA-C, on behalf of Dr. Silverio Decamp for surgical evaluation and recommendations regarding gallbladder sludge and possible biliary dyskinesia.  Patient's primary care physician is Dr. Ruby Cola.  Patient had been evaluated in my practice in 2015.  At that time he had abdominal complaints and had been diagnosed with gallbladder polyps.  He never underwent cholecystectomy.  Patient has now had several months of right upper quadrant abdominal discomfort associated with bloating following meals with fatty foods.  His most recent episode lasted up to 11 hours and was accompanied by emesis.  Patient underwent an abdominal ultrasound on June 13, 2021.  This demonstrated a distended gallbladder containing sludge but no stones.  Note was made of a mildly dilated common bile duct.  Subsequent MRCP was performed which showed a normal caliber common bile duct with small sludge in the gallbladder.  Patient had a nuclear medicine hepatobiliary scan which demonstrated occlusion of the cystic duct.  He now comes to surgery for cholecystectomy.   Procedure:  The patient was seen in the pre-op holding area. The risks, benefits, complications, treatment options, and expected outcomes were previously discussed with the patient. The patient agreed with the proposed plan and has signed the informed consent form.  The patient was brought to the operating room by the surgical team, identified as Curtis Jimenez and the procedure verified. A "time out" was completed and the above information confirmed.  Following administration of general  anesthesia, the patient is positioned and then prepped and draped in usual aseptic fashion.  After ascertaining that an adequate level of anesthesia been achieved, an infraumbilical incision is made transversely with a #15 blade.  Dissection is carried through subcutaneous tissues.  Fascia is incised in the midline and the peritoneal cavity is entered cautiously.  A 0 Vicryl pursestring suture is placed at the fascial incision.  An Hassan cannula is introduced under direct vision and secured with the pursestring suture.  Abdomen is insufflated with carbon dioxide.  Laparoscope was introduced and the abdomen is explored.  Operative ports were placed under direct vision in the subxiphoid location, midclavicular location, and anterior axillary line on the right.  Gallbladder is markedly distended.  Using an aspirating trocar the contents of the gallbladder are evacuated.  Gallbladder is then grasped and retracted cephalad.  Peritoneum is incised at the neck of the gallbladder using the hook electrocautery.  With gentle blunt dissection the cystic duct is identified, doubly clipped, and divided.  A branch of the cystic artery is immediately posterior to the cystic duct.  This is secured with ligaclips and then divided with the electrocautery.  Gallbladder is then dissected out the gallbladder bed using the hook electrocautery for hemostasis.  There is a prominent vessel in the mid part of the gallbladder bed which requires occlusion with ligaclips.  Good hemostasis is noted.  Remainder of the gallbladder is excised from the gallbladder bed using the hook electrocautery for hemostasis.  Gallbladder is placed into an Endo Catch bag and withdrawn through the umbilical port without difficulty.  Right upper quadrant is irrigated with warm saline which is evacuated.  Good hemostasis is achieved on the gallbladder bed with the electrocautery.  Fluid is evacuated.  Pneumoperitoneum is released.  Ports are removed under  direct vision with good hemostasis noted at the port sites.  0 Vicryl pursestring suture is tied securely.  Local anesthetic is infiltrated at all port sites.  Skin incisions are closed with interrupted 4-0 Monocryl subcuticular sutures.  Wounds are washed and dried and Dermabond is applied as dressing.  Patient is awakened from anesthesia and brought to the recovery room.  The patient tolerated the procedure well.   Armandina Gemma, [facility] Office: [phone]

## 2021-08-18 NOTE — Anesthesia Procedure Notes (Signed)
Procedure Name: Intubation Date/Time: 08/18/2021 7:27 AM Performed by: Sharlette Dense, CRNA Pre-anesthesia Checklist: Patient identified, Emergency Drugs available, Suction available and Patient being monitored Patient Re-evaluated:Patient Re-evaluated prior to induction Oxygen Delivery Method: Circle system utilized Preoxygenation: Pre-oxygenation with 100% oxygen Induction Type: IV induction Ventilation: Mask ventilation without difficulty Laryngoscope Size: Miller and 3 Grade View: Grade I Tube type: Oral Tube size: 8.0 mm Number of attempts: 1 Airway Equipment and Method: Stylet and Oral airway Placement Confirmation: ETT inserted through vocal cords under direct vision, positive ETCO2 and breath sounds checked- equal and bilateral Secured at: 22 cm Tube secured with: Tape Dental Injury: Teeth and Oropharynx as per pre-operative assessment

## 2021-08-18 NOTE — Plan of Care (Signed)
Instructions were reviewed with patient. All questions were answered. Patient was transported to main entrance by wheelchair. ° °

## 2021-08-18 NOTE — Anesthesia Postprocedure Evaluation (Signed)
Anesthesia Post Note  Patient: Curtis Jimenez  Procedure(s) Performed: LAPAROSCOPIC CHOLECYSTECTOMY WITH CHOLANGIOGRAM (Abdomen)     Patient location during evaluation: PACU Anesthesia Type: General Level of consciousness: awake and alert Pain management: pain level controlled Vital Signs Assessment: post-procedure vital signs reviewed and stable Respiratory status: spontaneous breathing, nonlabored ventilation, respiratory function stable and patient connected to nasal cannula oxygen Cardiovascular status: blood pressure returned to baseline and stable Postop Assessment: no apparent nausea or vomiting Anesthetic complications: no   No notable events documented.  Last Vitals:  Vitals:   08/18/21 1023 08/18/21 1132  BP: (!) 142/68 (!) 123/58  Pulse: 66 75  Resp: 17 18  Temp: 36.4 C 36.7 C  SpO2: 99% (!) 89%    Last Pain:  Vitals:   08/18/21 1132  TempSrc: Oral  PainSc:                  [place]

## 2021-08-18 NOTE — Discharge Summary (Signed)
Physician Discharge Summary   Patient ID: Curtis Jimenez MRN: [id number] DOB/AGE: [birth year] 85 y.o.  Admit date: 08/18/2021  Discharge date: 08/18/2021  Discharge Diagnoses:  Principal Problem:   Gallbladder sludge Active Problems:   RUQ abdominal pain   Chronic cholecystitis without calculus   Discharged Condition: good  Hospital Course: Patient was admitted for observation following gallbladder surgery.  Post op course was uncomplicated.  Pain was well controlled.  Tolerated diet.  Patient was prepared for discharge home on the afternoon of surgery.   Consults: None  Treatments: surgery: laparoscopic cholecystectomy  Discharge Exam: Blood pressure 127/68, pulse 78, temperature 97.8 F (36.6 C), temperature source Oral, resp. rate 18, height 5\' 9"  (1.753 m), weight 80.7 kg, SpO2 92 %.  See nurse's notes.   Disposition: Home  Discharge Instructions     Diet - low sodium heart healthy   Complete by: As directed    Discharge instructions   Complete by: As directed    [facility]  LAPAROSCOPIC SURGERY:  POST-OP INSTRUCTIONS  Always review your discharge instruction sheet given to you by the facility where your surgery was performed.  A prescription for pain medication may be given to you upon discharge.  Take your pain medication as prescribed.  If narcotic pain medicine is not needed, then you may take acetaminophen (Tylenol) or ibuprofen (Advil) as needed.  Take your usually prescribed medications unless otherwise directed.  If you need a refill on your pain medication, please contact your pharmacy.  They will contact our office to request authorization. Prescriptions will not be filled after 5 P.M. or on weekends.  You should follow a light diet the first few days after arrival home, such as soup and crackers or toast.  Be sure to include plenty of fluids daily.  Most patients will experience some swelling and bruising in the area of the  incisions.  Ice packs will help.  Swelling and bruising can take several days to resolve.   It is common to experience some constipation after surgery.  Increasing fluid intake and taking a stool softener (such as Colace) will usually help or prevent this problem from occurring.  A mild laxative (Milk of Magnesia or Miralax) should be taken according to package instructions if there has been no bowel movement after 48 hours.  You will likely have Dermabond (topical glue) over your incisions.  This seals the incisions and allows you to bathe and shower at any time after your surgery.  Glue should remain in place for up to 10 days.  It may be removed after 10 days by pealing off the Dermabond material or using Vaseline or naval jelly to remove.  If you have steri-strips over your incisions, you may remove the gauze bandage on the second day after surgery, and you may shower at that time.  Leave your steri-strips (small skin tapes) in place directly over the incision.  These strips should remain on the skin for 5-7 days and then be removed.  You may get them wet in the shower and pat them dry.  Any sutures or staples will be removed at the office during your follow-up visit.  ACTIVITIES:  You may resume regular (light) daily activities beginning the next day - such as daily self-care, walking, climbing stairs - gradually increasing activities as tolerated.  You may have sexual intercourse when it is comfortable.  Refrain from any heavy lifting or straining until approved by your doctor.  You may drive  when you are no longer taking prescription pain medication, when you can comfortably wear a seatbelt, and when you can safely maneuver your car and apply brakes.  You should see your doctor in the office for a follow-up appointment approximately 2-3 weeks after your surgery.  Make sure that you call for this appointment within a day or two after you arrive home to insure a convenient appointment  time.  WHEN TO CALL YOUR DOCTOR: Fever over 101.0 Inability to urinate Continued bleeding from incision Increased pain, redness, or drainage from the incision Increasing abdominal pain  The clinic staff is available to answer your questions during regular business hours.  Please don't hesitate to call and ask to speak to one of the nurses for clinical concerns.  If you have a medical emergency, go to the nearest emergency room or call 911.  A surgeon from [facility] is always on call for the hospital.  Armandina Gemma, [facility] Office: [place] Free:  [place] [phone]  Website: [email or web address]   Increase activity slowly   Complete by: As directed    No dressing needed   Complete by: As directed       Allergies as of 08/18/2021       Reactions   Amantadine Hcl Itching   REACTION: itching   Chlorpromazine Hcl    REACTION: "fell flat on face"   Grapefruit Flavor [flavoring Agent] Other (See Comments)   Can't take with medication        Medication List     TAKE these medications    acetaminophen 325 MG tablet Commonly known as: TYLENOL Take 650 mg by mouth every 6 (six) hours as needed for mild pain.   allopurinol 300 MG tablet Commonly known as: ZYLOPRIM Take 1 tablet by mouth once daily   carvedilol 12.5 MG tablet Commonly known as: COREG TAKE 1/2 (ONE-HALF) TABLET BY MOUTH IN THE MORNING AND 1 IN THE EVENING What changed:  how much to take how to take this when to take this additional instructions   clopidogrel 75 MG tablet Commonly known as: PLAVIX Take 1 tablet by mouth once daily   colchicine 0.6 MG tablet Take 1 tablet by mouth daily as needed (Gout).   Euthyrox 25 MCG tablet Generic drug: levothyroxine Take 1 tablet by mouth once daily What changed:  how much to take how to take this when to take this additional instructions   FISH OIL PO Take 2 g by mouth 2  (two) times daily.   hydrOXYzine 25 MG tablet Commonly known as: ATARAX/VISTARIL Take 25 mg by mouth daily.   melatonin 5 MG Tabs Take 10 mg by mouth at bedtime as needed (sleep).   multivitamin tablet Take 1 tablet by mouth at bedtime.   mycophenolate 180 MG EC tablet Commonly known as: MYFORTIC Take 360 mg by mouth 2 (two) times daily.   pantoprazole 40 MG tablet Commonly known as: PROTONIX Take 1 tablet by mouth twice daily   risperiDONE 1 MG tablet Commonly known as: RISPERDAL Take 1 mg by mouth at bedtime.   simvastatin 20 MG tablet Commonly known as: ZOCOR Take 1 tablet by mouth once daily What changed: when to take this   sulfamethoxazole-trimethoprim 400-80 MG tablet Commonly known as: BACTRIM Take 1 tablet by mouth every Monday, Wednesday, and Friday.   tacrolimus 0.5 MG capsule Commonly known as: PROGRAF See admin instructions. 2 in the am and 1 at night   traMADol 50  MG tablet Commonly known as: ULTRAM Take 1-2 tablets (50-100 mg total) by mouth every 6 (six) hours as needed.               Discharge Care Instructions  (From admission, onward)           Start     Ordered   08/18/21 0000  No dressing needed        08/18/21 1635            Follow-up Information     Armandina Gemma, MD. Schedule an appointment as soon as possible for a visit in 3 week(s).   Specialty: General Surgery Why: For wound re-check Contact information: [place] [address] [phone]                 Rana Adorno, [facility] Office: [phone]   Signed: Armandina Gemma 08/18/2021, 4:35 PM

## 2021-08-18 NOTE — Progress Notes (Signed)
Mobility Specialist - Progress Note    08/18/21 1306  Mobility  Activity Ambulated in hall;Ambulated to bathroom  Level of Assistance Standby assist, set-up cues, supervision of patient - no hands on  Assistive Device Front wheel walker  Distance Ambulated (ft) 450 ft  Mobility Ambulated with assistance in room;Ambulated with assistance in hallway  Mobility Response Tolerated well  Mobility performed by Mobility specialist  $Mobility charge 1 Mobility     Pt practiced log roll technique to sit EOB and was encouraged by family and mobility specialist to use  RW during ambulation due to pt feeling "wobbly", pt then agreed. Pt ambulated ~450 ft in hallway using RW and did not c/o of SOB, pain, or dizziness. When returning to room, pt ambulated to bathroom and insisted on not using RW. No LOB present when ambulating in bathroom and returning to bed. Pt was then left in bed with call bell at side and family in room.   [place] Specialist [facility] Phone: [phone] 08/18/21, 1:09 PM

## 2021-08-19 ENCOUNTER — Encounter (HOSPITAL_COMMUNITY): Payer: Self-pay | Admitting: Surgery

## 2021-08-19 LAB — SURGICAL PATHOLOGY

## 2021-08-19 NOTE — Progress Notes (Signed)
Benign gallbladder as expected.  Parks, MD [facility] [place] practice Office: [phone]

## 2021-08-20 ENCOUNTER — Encounter: Payer: Self-pay | Admitting: Family Medicine

## 2021-08-27 DIAGNOSIS — I129 Hypertensive chronic kidney disease with stage 1 through stage 4 chronic kidney disease, or unspecified chronic kidney disease: Secondary | ICD-10-CM | POA: Diagnosis not present

## 2021-08-27 DIAGNOSIS — M7989 Other specified soft tissue disorders: Secondary | ICD-10-CM | POA: Diagnosis not present

## 2021-08-27 DIAGNOSIS — R0902 Hypoxemia: Secondary | ICD-10-CM | POA: Diagnosis not present

## 2021-08-27 DIAGNOSIS — Z940 Kidney transplant status: Secondary | ICD-10-CM | POA: Diagnosis not present

## 2021-08-27 DIAGNOSIS — E669 Obesity, unspecified: Secondary | ICD-10-CM | POA: Diagnosis not present

## 2021-08-27 DIAGNOSIS — K922 Gastrointestinal hemorrhage, unspecified: Secondary | ICD-10-CM | POA: Diagnosis not present

## 2021-08-27 DIAGNOSIS — M109 Gout, unspecified: Secondary | ICD-10-CM | POA: Diagnosis not present

## 2021-08-27 DIAGNOSIS — R739 Hyperglycemia, unspecified: Secondary | ICD-10-CM | POA: Diagnosis not present

## 2021-09-16 ENCOUNTER — Other Ambulatory Visit: Payer: Self-pay | Admitting: Family Medicine

## 2021-09-16 ENCOUNTER — Encounter: Payer: Self-pay | Admitting: Family Medicine

## 2021-09-18 ENCOUNTER — Ambulatory Visit (INDEPENDENT_AMBULATORY_CARE_PROVIDER_SITE_OTHER): Payer: Medicare HMO | Admitting: Family Medicine

## 2021-09-18 ENCOUNTER — Encounter: Payer: Self-pay | Admitting: Family Medicine

## 2021-09-18 ENCOUNTER — Other Ambulatory Visit: Payer: Self-pay

## 2021-09-18 VITALS — BP 101/65 | HR 67 | Temp 97.8°F | Resp 16 | Ht 69.0 in | Wt 183.2 lb

## 2021-09-18 DIAGNOSIS — Z940 Kidney transplant status: Secondary | ICD-10-CM

## 2021-09-18 DIAGNOSIS — Z9049 Acquired absence of other specified parts of digestive tract: Secondary | ICD-10-CM | POA: Diagnosis not present

## 2021-09-18 DIAGNOSIS — E039 Hypothyroidism, unspecified: Secondary | ICD-10-CM | POA: Diagnosis not present

## 2021-09-18 DIAGNOSIS — N183 Chronic kidney disease, stage 3 unspecified: Secondary | ICD-10-CM | POA: Diagnosis not present

## 2021-09-18 DIAGNOSIS — I10 Essential (primary) hypertension: Secondary | ICD-10-CM

## 2021-09-18 DIAGNOSIS — R7303 Prediabetes: Secondary | ICD-10-CM | POA: Diagnosis not present

## 2021-09-18 DIAGNOSIS — I770 Arteriovenous fistula, acquired: Secondary | ICD-10-CM | POA: Diagnosis not present

## 2021-09-18 DIAGNOSIS — E7800 Pure hypercholesterolemia, unspecified: Secondary | ICD-10-CM

## 2021-09-18 DIAGNOSIS — N1831 Chronic kidney disease, stage 3a: Secondary | ICD-10-CM | POA: Diagnosis not present

## 2021-09-18 LAB — POCT GLYCOSYLATED HEMOGLOBIN (HGB A1C)
HbA1c POC (<> result, manual entry): 5.5 % (ref 4.0–5.6)
HbA1c, POC (controlled diabetic range): 5.5 % (ref 0.0–7.0)
HbA1c, POC (prediabetic range): 5.5 % — AB (ref 5.7–6.4)
Hemoglobin A1C: 5.5 % (ref 4.0–5.6)

## 2021-09-18 MED ORDER — LEVOTHYROXINE SODIUM 25 MCG PO TABS: 25.0000 ug | ORAL_TABLET | Freq: Every day | ORAL | 3 refills | Status: DC

## 2021-09-18 MED ORDER — TRAZODONE HCL 50 MG PO TABS: ORAL_TABLET | ORAL | 1 refills | Status: DC

## 2021-09-18 MED ORDER — SIMVASTATIN 20 MG PO TABS: 20.0000 mg | ORAL_TABLET | Freq: Every day | ORAL | 3 refills | Status: DC

## 2021-09-18 NOTE — Progress Notes (Signed)
OFFICE VISIT  09/18/2021  CC:  Chief Complaint  Patient presents with   Follow-up    RCI, pt is fasting    HPI:    Patient is a 85 y.o. Caucasian male who presents for 4 mo f/u HTN, CRI III in the setting of hx of renal transplant, HLD, hypothyroidism. A/P as of last visit: "1) HTN: well controlled on 1/2 of 12.5mg  coreg bid. Lytes/cr today.   2) HLD: tolerating simvastatin 20mg  qd. LDL goal 70.  Most recent LDL was 72 4 mo ago. Plan recheck flp and hepatic panel next f/u in 4 mo.   3) Hypothyroidism: 25 mcg qd. TSH monitoring today.   4) CRI III, hx of renal transplant. Avoids nsaids and is working on good hydration habits. Cont tacrolimus, mycophenolate, and bactrim as per renal/transplant MDs. Lytes/cr monitoring today.   5) Preventative health care: Shingrix rx to pharmacy today. Covid x 4 UTD."  INTERIM HX: He got lap cholecystectomy 08/18/21.  All went fine.  Gets labs via nephrologist at [place] this afternoon. No home bp monitoring.  He is quite worried about his L arm fistula that has been gradually enlarging, says causes more emotional stress than physical symptoms/discomfort.    ROS as above, plus--> no fevers, no CP, no SOB, no wheezing, no cough, no dizziness, no HAs, no rashes, no melena/hematochezia.  No polyuria or polydipsia.  No myalgias or arthralgias.  No focal weakness, paresthesias, or tremors.  No acute vision or hearing abnormalities.  No dysuria or unusual/new urinary urgency or frequency.  No recent changes in lower legs. No n/v/d or abd pain.  No palpitations.    Past Medical History:  Diagnosis Date   Basal cell carcinoma    neck (skin MD 2X per year)   Bifascicular block 2021   ectopy, asymptomatic.  Cards->observe   Bipolar disorder (HCC)    BPH (benign prostatic hyperplasia)    with elevated PSA; followed by Dr. Rosana Hoes at [facility]   Chronic renal insufficiency, stage III (moderate) ([place])    in pt w/hx of renal transplant.   Post-transplant sCr 1.4-1.6.   Coronary atherosclerosis    LM and 3 V dz noted on CT 08/2019 performed for eval of interstitial lung dz in the setting of mild DOE and mild hypoxia. Cards->no stress tesing indicated->primary prevention emphasized   CVA (cerebral vascular accident) ([place]) 11/2016   Pontine (vertebrobasilar--imaging neg), TPA given.  Pt discharged on plavix.  Carotid dopplers ok, echocardiogram ok.  Residual deficit: vertigo but this improved greatly with therapy.   DOE (dyspnea on exertion) 2020   DOE and ? hypoxia->CXR 08/29/19--->diffuse interstitial opacities-? acute inflammatory process->Dr. Melvyn Novas eval'd him and was underwhelmed by the CXR and exam->CT chest 09/26/19 "Very subtle areas of mild ground-glass attenuatio-nonspecific", mild air trapping. Per Dr. Melvyn Novas, no signif ILD, improving with inc ambulation (post-inflamm pulm fibrosis?)   Esophagitis 06/14/2020   EGD->+distal esophagitis, h pylori neg, mild distal esoph stenosis widened with forceps, benign gastric polyps.   Gallbladder polyp 2015   Asymptomatic   Gallbladder sludge 2022   Hepatobiliary scan abnl, cystic duct obstructed-->cholecystectomy 07/2021.   GERD (gastroesophageal reflux disease)    Gout    History of adenomatous polyp of colon 2018   Recall 5 yrs   Hyperlipidemia    Hypertension    Hypothyroidism    Lumbar spondylosis    Ptosis due to aging    Right   Rectal bleeding 09/2017   Admitted for obs due to Hb drop.  GI consulted---obs recommended.  No transfusion required.  Plavix was d/c'd, ferrous sulfate recommended.  Outpt GI f/u--plavix restarted and upper endoscopy was normal and colonoscopy showed 2 polyps and severe diverticular dz. Iron d/c'd 04/24/19.   Renal cyst    per pt he got MRI kidneys at the [facility] 12/2020 and the images will be viewed by Dr. Clover Mealy   Restless legs syndrome    S/p cadaver renal transplant 2013   Secondary to HTN +lithium toxicity over 30 yrs caused kidney destruction  [facility] transplant MDs)   Seborrheic dermatitis    eyebrows worst: Hytone rx'd by Dr. Denna Haggard.   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   left jawline(CX35FU), Left forehead (CX35FU) right shoulder (CX35FU), R forearm    Past Surgical History:  Procedure Laterality Date   AV FISTULA PLACEMENT  04/08/2012   Procedure: ARTERIOVENOUS (AV) FISTULA CREATION;  Surgeon: Angelia Mould, MD;  Location: [place];  Service: Vascular;  Laterality: Left;  Creation of left brachial cephalic arteriovenous fistula   BIOPSY  06/14/2020   Procedure: BIOPSY;  Surgeon: Lavena Bullion, DO;  Location: [facility];  Service: Gastroenterology;;   CATARACT EXTRACTION, BILATERAL  05/2018   CHOLECYSTECTOMY N/A 08/18/2021   Procedure: LAPAROSCOPIC CHOLECYSTECTOMY WITH CHOLANGIOGRAM;  Surgeon: Armandina Gemma, MD;  Location: [facility];  Service: General;  Laterality: N/A;   COLONOSCOPY  2013; 11/24/17   2013; Normal except diverticulosis (recall 10 yrs).  2018 (for GI bleeding)--severe diverticular dz + 2 adenomatous polyps.  Recall 5 yrs.   DG CHEST AP OR PA ([facility] HX)  06/03/2020   Bibasilar atelectasis. No focal consoliadation definitively identified.   dilation for GERD     ESOPHAGOGASTRODUODENOSCOPY  11/24/2017   gastric polyps x 2, otherwise normal.   ESOPHAGOGASTRODUODENOSCOPY N/A 06/14/2020   +distal esophagitis, h pylori neg, mild distal esoph stenosis widened with forceps, benign gastric polypsProcedure: ESOPHAGOGASTRODUODENOSCOPY (EGD);  Surgeon: Lavena Bullion, DO;  Location: [facility];  Service: Gastroenterology;  Laterality: N/A;  Diagnostic EGD as patietn last took Plavix 11am on 06/12/2020   KIDNEY TRANSPLANT  11/06/12   [place] (cadaveric)   PROSTATE BIOPSY  2011   no malignancy   TRANSTHORACIC ECHOCARDIOGRAM  11/2016   Normal LV systolic fxn, EF 26-33%.  Grade I DD.  Mild aortic root dilatation, mild MV regurg.    Outpatient Medications Prior to Visit  Medication Sig Dispense  Refill   allopurinol (ZYLOPRIM) 300 MG tablet Take 1 tablet by mouth once daily 90 tablet 0   carvedilol (COREG) 12.5 MG tablet TAKE 1/2 (ONE-HALF) TABLET BY MOUTH IN THE MORNING AND 1 IN THE EVENING (Patient taking differently: Take 6.25 mg by mouth 2 (two) times daily with a meal.) 135 tablet 0   clopidogrel (PLAVIX) 75 MG tablet Take 1 tablet by mouth once daily 90 tablet 0   EUTHYROX 25 MCG tablet Take 1 tablet by mouth once daily 30 tablet 0   hydrOXYzine (ATARAX/VISTARIL) 25 MG tablet Take 25 mg by mouth daily.     melatonin 5 MG TABS Take 10 mg by mouth at bedtime as needed (sleep).     Multiple Vitamin (MULTIVITAMIN) tablet Take 1 tablet by mouth at bedtime.     mycophenolate (MYFORTIC) 180 MG EC tablet Take 360 mg by mouth 2 (two) times daily.      Omega-3 Fatty Acids (FISH OIL PO) Take 2 g by mouth 2 (two) times daily.     pantoprazole (PROTONIX) 40 MG tablet Take 1 tablet  by mouth twice daily 180 tablet 3   risperiDONE (RISPERDAL) 1 MG tablet Take 1 mg by mouth at bedtime.      sulfamethoxazole-trimethoprim (BACTRIM,SEPTRA) 400-80 MG tablet Take 1 tablet by mouth every Monday, Wednesday, and Friday.     tacrolimus (PROGRAF) 0.5 MG capsule See admin instructions. 2 in the am and 1 at night     acetaminophen (TYLENOL) 325 MG tablet Take 650 mg by mouth every 6 (six) hours as needed for mild pain. (Patient not taking: Reported on 09/18/2021)     colchicine 0.6 MG tablet Take 1 tablet by mouth daily as needed (Gout). (Patient not taking: Reported on 09/18/2021)     simvastatin (ZOCOR) 20 MG tablet Take 1 tablet by mouth once daily (Patient taking differently: Take 20 mg by mouth daily at 6 PM.) 90 tablet 0   traMADol (ULTRAM) 50 MG tablet Take 1-2 tablets (50-100 mg total) by mouth every 6 (six) hours as needed. (Patient not taking: Reported on 09/18/2021) 15 tablet 0   No facility-administered medications prior to visit.    Allergies  Allergen Reactions   Amantadine Hcl Itching     REACTION: itching   Chlorpromazine Hcl     REACTION: "fell flat on face"   Grapefruit Flavor [Flavoring Agent] Other (See Comments)    Can't take with medication    ROS As per HPI  PE: Vitals with BMI 09/18/2021 08/18/2021 08/18/2021  Height 5\' 9"  - -  Weight 183 lbs 3 oz - -  BMI 24.26 - -  Systolic 834 196 222  Diastolic 65 68 72  Pulse 67 78 81     Gen: Alert, well appearing.  Patient is oriented to person, place, time, and situation. AFFECT: pleasant, lucid thought and speech. CV: RRR, no m/r/g.   LUNGS: CTA bilat, nonlabored resps, good aeration in all lung fields. EXT: no clubbing or cyanosis.  no edema.  L arm: large nontender and nonerythematous fistula. Arm and hand warm, pink, mild STS, no pitting edema.  LABS:  Lab Results  Component Value Date   TSH 0.64 05/20/2021   Lab Results  Component Value Date   WBC 5.2 08/07/2021   HGB 13.3 08/07/2021   HCT 43.3 08/07/2021   MCV 91.7 08/07/2021   PLT 214 08/07/2021   Lab Results  Component Value Date   CREATININE 1.54 (H) 08/07/2021   BUN 27 (H) 08/07/2021   NA 142 08/07/2021   K 4.2 08/07/2021   CL 110 08/07/2021   CO2 26 08/07/2021   Lab Results  Component Value Date   ALT 9 06/04/2021   AST 14 06/04/2021   ALKPHOS 53 06/04/2021   BILITOT 0.8 06/04/2021   Lab Results  Component Value Date   CHOL 132 01/21/2021   Lab Results  Component Value Date   HDL 44 01/21/2021   Lab Results  Component Value Date   LDLCALC 72 01/21/2021   Lab Results  Component Value Date   TRIG 84 01/21/2021   Lab Results  Component Value Date   CHOLHDL 3.0 01/21/2021   Lab Results  Component Value Date   PSA 4.2 (H) 11/05/2017   PSA 3.51 03/28/2008   PSA 4.83 (H) 12/02/2007   Lab Results  Component Value Date   HGBA1C 5.5 09/18/2021   HGBA1C 5.5 09/18/2021   HGBA1C 5.5 (A) 09/18/2021   HGBA1C 5.5 09/18/2021   POC Hba1c today: 5.5%  IMPRESSION AND PLAN:  1) Insomnia: pt c/o longterm trouble  initiating/maintaining sleep, is  going on long car trip soon and would like to get some good sleep prior/during his trip.   Will do trial of trazodone 50mg , 1-2 qhs prn.  Therapeutic expectations and side effect profile of medication discussed today.  Patient's questions answered.  2) CRI III, hx of renal transplant, on antirejection meds. He is getting labs for his nephrologist at [place] later this afternoon. Renal function stable at last check at end of recent hospitalization for cholecystectomy (sCr 1.54 on 08/07/21).  3) HTN: stable on 1/2 of 12.5mg  coreg tab qAM and whole tab qPM.  4) HLD: lipids have been stable at goal, most recently 12/2020.  Plan rpt lipids next f/u 4 mo.  5) Hypothyroidism:  TSH's historically stable/normal. TSH monitoring next f/u in 4 mo.  6) L arm AV fistula, gradually enlarging.  Causing more emotional distress than physical. Pt would like to talk to vasc surg about options.  7) Prediabetes: a1c check today is down and out of prediabetes range now.  An After Visit Summary was printed and given to the patient.  FOLLOW UP: Return in about 4 months (around 01/18/2022) for routine chronic illness f/u.  Signed:  Crissie Sickles, MD           09/18/2021

## 2021-09-25 ENCOUNTER — Ambulatory Visit: Payer: Medicare HMO | Admitting: Vascular Surgery

## 2021-09-25 ENCOUNTER — Other Ambulatory Visit: Payer: Self-pay

## 2021-09-25 ENCOUNTER — Encounter: Payer: Self-pay | Admitting: Vascular Surgery

## 2021-09-25 VITALS — BP 117/80 | HR 74 | Temp 97.9°F | Resp 20 | Ht 69.0 in | Wt 182.0 lb

## 2021-09-25 DIAGNOSIS — Z992 Dependence on renal dialysis: Secondary | ICD-10-CM

## 2021-09-25 DIAGNOSIS — N186 End stage renal disease: Secondary | ICD-10-CM

## 2021-09-25 NOTE — H&P (View-Only) (Signed)
REASON FOR VISIT:   To evaluate for banding of AV fistula versus ligation.  The consult is requested by Dr. Corliss Parish.  MEDICAL ISSUES:   CHRONIC KIDNEY DISEASE: This patient has a functioning renal transplant which he has had for 9 or 10 years.  He has never used his upper arm fistula.  He complains of some chronic swelling in the left arm and also occasional tingling in the hand.  The fistula is markedly aneurysmal.  All things considered I think it would be reasonable to ligate the fistula given that he is having symptoms.  He understands this is associated with some risk and that if his kidney transplant fails he would have to be evaluated for new access.  We have discussed this in detail and he is agreeable to proceed.  He is scheduled for a trip to [place] would like to do this when he comes back.  We have scheduled this for October 24.  I discussed the indications for the procedure and potential complications with the patient and he is agreeable to proceed.  HPI:   Curtis Jimenez is a pleasant 85 y.o. male who had a left upper arm fistula placed many years ago.  He subsequently underwent renal transplantation and this has been working well.  He had a cadaveric renal transplant in November 2013.  He is never used his fistula.  He comes in today to discuss ligating his fistula.  He states that he has had some chronic swelling in the left arm for the last several years.  He also describes some intermittent tingling and numbness in the left hand.  Past Medical History:  Diagnosis Date   Basal cell carcinoma    neck (skin MD 2X per year)   Bifascicular block 2021   ectopy, asymptomatic.  Cards->observe   Bipolar disorder (HCC)    BPH (benign prostatic hyperplasia)    with elevated PSA; followed by Dr. Rosana Hoes at [facility]   Chronic renal insufficiency, stage III (moderate) ([place])    in pt w/hx of renal transplant.  Post-transplant sCr 1.4-1.6.   Coronary  atherosclerosis    LM and 3 V dz noted on CT 08/2019 performed for eval of interstitial lung dz in the setting of mild DOE and mild hypoxia. Cards->no stress tesing indicated->primary prevention emphasized   CVA (cerebral vascular accident) ([place]) 11/2016   Pontine (vertebrobasilar--imaging neg), TPA given.  Pt discharged on plavix.  Carotid dopplers ok, echocardiogram ok.  Residual deficit: vertigo but this improved greatly with therapy.   DOE (dyspnea on exertion) 2020   DOE and ? hypoxia->CXR 08/29/19--->diffuse interstitial opacities-? acute inflammatory process->Dr. Melvyn Novas eval'd him and was underwhelmed by the CXR and exam->CT chest 09/26/19 "Very subtle areas of mild ground-glass attenuatio-nonspecific", mild air trapping. Per Dr. Melvyn Novas, no signif ILD, improving with inc ambulation (post-inflamm pulm fibrosis?)   Esophagitis 06/14/2020   EGD->+distal esophagitis, h pylori neg, mild distal esoph stenosis widened with forceps, benign gastric polyps.   Gallbladder polyp 2015   Asymptomatic   Gallbladder sludge 2022   Hepatobiliary scan abnl, cystic duct obstructed-->cholecystectomy 07/2021.   GERD (gastroesophageal reflux disease)    Gout    History of adenomatous polyp of colon 2018   Recall 5 yrs   Hyperlipidemia    Hypertension    Hypothyroidism    Lumbar spondylosis    Ptosis due to aging    Right   Rectal bleeding 09/2017   Admitted for obs due to Hb drop.  GI consulted---obs recommended.  No transfusion required.  Plavix was d/c'd, ferrous sulfate recommended.  Outpt GI f/u--plavix restarted and upper endoscopy was normal and colonoscopy showed 2 polyps and severe diverticular dz. Iron d/c'd 04/24/19.   Renal cyst    per pt he got MRI kidneys at [facility] 12/2020 and the images will be viewed by Dr. Clover Mealy   Restless legs syndrome    S/p cadaver renal transplant 2013   Secondary to HTN +lithium toxicity over 30 yrs caused kidney destruction [facility] transplant MDs)   Seborrheic  dermatitis    eyebrows worst: Hytone rx'd by Dr. Denna Haggard.   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   left jawline(CX35FU), Left forehead (CX35FU) right shoulder (CX35FU), R forearm    Family History  Problem Relation Age of Onset   Ulcers Mother        GI bleed    Heart disease Father    Bladder Cancer Father    Colon cancer Neg Hx    Esophageal cancer Neg Hx    Pancreatic cancer Neg Hx    Rectal cancer Neg Hx    Stomach cancer Neg Hx     SOCIAL HISTORY: Social History   Tobacco Use   Smoking status: Former    Types: Pipe, Cigars    Quit date: 05/11/1974    Years since quitting: 47.4   Smokeless tobacco: Never  Substance Use Topics   Alcohol use: No    Allergies  Allergen Reactions   Amantadine Hcl Itching    REACTION: itching   Chlorpromazine Hcl     REACTION: "fell flat on face"   Grapefruit Flavor [Flavoring Agent] Other (See Comments)    Can't take with medication    Current Outpatient Medications  Medication Sig Dispense Refill   carvedilol (COREG) 12.5 MG tablet TAKE 1/2 (ONE-HALF) TABLET BY MOUTH IN THE MORNING AND 1 IN THE EVENING (Patient taking differently: Take 6.25 mg by mouth 2 (two) times daily with a meal.) 135 tablet 0   clopidogrel (PLAVIX) 75 MG tablet Take 1 tablet by mouth once daily 90 tablet 0   EUTHYROX 25 MCG tablet Take 1 tablet by mouth once daily 30 tablet 0   hydrOXYzine (ATARAX/VISTARIL) 25 MG tablet Take 25 mg by mouth daily.     levothyroxine (EUTHYROX) 25 MCG tablet Take 1 tablet (25 mcg total) by mouth daily. 90 tablet 3   melatonin 5 MG TABS Take 10 mg by mouth at bedtime as needed (sleep).     Multiple Vitamin (MULTIVITAMIN) tablet Take 1 tablet by mouth at bedtime.     mycophenolate (MYFORTIC) 180 MG EC tablet Take 360 mg by mouth 2 (two) times daily.      Omega-3 Fatty Acids (FISH OIL PO) Take 2 g by mouth 2 (two) times daily.     pantoprazole (PROTONIX) 40 MG tablet Take 1 tablet by mouth twice daily 180 tablet 3    risperiDONE (RISPERDAL) 1 MG tablet Take 1 mg by mouth at bedtime.      simvastatin (ZOCOR) 20 MG tablet Take 1 tablet (20 mg total) by mouth daily. 90 tablet 3   sulfamethoxazole-trimethoprim (BACTRIM,SEPTRA) 400-80 MG tablet Take 1 tablet by mouth every Monday, Wednesday, and Friday.     tacrolimus (PROGRAF) 0.5 MG capsule See admin instructions. 2 in the am and 1 at night     traZODone (DESYREL) 50 MG tablet 1-2 tabs po qhs prn insomnia 30 tablet 1   acetaminophen (TYLENOL) 325 MG  tablet Take 650 mg by mouth every 6 (six) hours as needed for mild pain. (Patient not taking: No sig reported)     allopurinol (ZYLOPRIM) 300 MG tablet Take 1 tablet by mouth once daily (Patient not taking: Reported on 09/25/2021) 90 tablet 0   colchicine 0.6 MG tablet Take 1 tablet by mouth daily as needed (Gout). (Patient not taking: No sig reported)     No current facility-administered medications for this visit.    REVIEW OF SYSTEMS:  [X]  denotes positive finding, [ ]  denotes negative finding Cardiac  Comments:  Chest pain or chest pressure:    Shortness of breath upon exertion:    Short of breath when lying flat:    Irregular heart rhythm:        Vascular    Pain in calf, thigh, or hip brought on by ambulation:    Pain in feet at night that wakes you up from your sleep:     Blood clot in your veins:    Leg swelling:         Pulmonary    Oxygen at home:    Productive cough:     Wheezing:         Neurologic    Sudden weakness in arms or legs:     Sudden numbness in arms or legs:     Sudden onset of difficulty speaking or slurred speech:    Temporary loss of vision in one eye:     Problems with dizziness:         Gastrointestinal    Blood in stool:     Vomited blood:         Genitourinary    Burning when urinating:     Blood in urine:        Psychiatric    Major depression:         Hematologic    Bleeding problems:    Problems with blood clotting too easily:        Skin    Rashes or  ulcers:        Constitutional    Fever or chills:     PHYSICAL EXAM:   Vitals:   09/25/21 0934  BP: 117/80  Pulse: 74  Resp: 20  Temp: 97.9 F (36.6 C)  SpO2: 94%  Weight: 182 lb (82.6 kg)  Height: 5\' 9"  (1.753 m)    GENERAL: The patient is a well-nourished male, in no acute distress. The vital signs are documented above. CARDIAC: There is a regular rate and rhythm.  VASCULAR: I do not detect carotid bruits. He has a normal right radial pulse and a slightly diminished left radial pulse. He has mild left upper extremity swelling. PULMONARY: There is good air exchange bilaterally without wheezing or rales. MUSCULOSKELETAL: There are no major deformities or cyanosis. NEUROLOGIC: No focal weakness or paresthesias are detected. SKIN: There are no ulcers or rashes noted. PSYCHIATRIC: The patient has a normal affect.  DATA:    No new data  Deitra Mayo [facility] [phone]

## 2021-09-25 NOTE — Progress Notes (Signed)
REASON FOR VISIT:   To evaluate for banding of AV fistula versus ligation.  The consult is requested by Dr. Corliss Parish.  MEDICAL ISSUES:   CHRONIC KIDNEY DISEASE: This patient has a functioning renal transplant which he has had for 9 or 10 years.  He has never used his upper arm fistula.  He complains of some chronic swelling in the left arm and also occasional tingling in the hand.  The fistula is markedly aneurysmal.  All things considered I think it would be reasonable to ligate the fistula given that he is having symptoms.  He understands this is associated with some risk and that if his kidney transplant fails he would have to be evaluated for new access.  We have discussed this in detail and he is agreeable to proceed.  He is scheduled for a trip to [place] would like to do this when he comes back.  We have scheduled this for October 24.  I discussed the indications for the procedure and potential complications with the patient and he is agreeable to proceed.  HPI:   Curtis Jimenez is a pleasant 85 y.o. male who had a left upper arm fistula placed many years ago.  He subsequently underwent renal transplantation and this has been working well.  He had a cadaveric renal transplant in November 2013.  He is never used his fistula.  He comes in today to discuss ligating his fistula.  He states that he has had some chronic swelling in the left arm for the last several years.  He also describes some intermittent tingling and numbness in the left hand.  Past Medical History:  Diagnosis Date   Basal cell carcinoma    neck (skin MD 2X per year)   Bifascicular block 2021   ectopy, asymptomatic.  Cards->observe   Bipolar disorder (HCC)    BPH (benign prostatic hyperplasia)    with elevated PSA; followed by Dr. Rosana Hoes at [facility]   Chronic renal insufficiency, stage III (moderate) ([place])    in pt w/hx of renal transplant.  Post-transplant sCr 1.4-1.6.   Coronary  atherosclerosis    LM and 3 V dz noted on CT 08/2019 performed for eval of interstitial lung dz in the setting of mild DOE and mild hypoxia. Cards->no stress tesing indicated->primary prevention emphasized   CVA (cerebral vascular accident) ([place]) 11/2016   Pontine (vertebrobasilar--imaging neg), TPA given.  Pt discharged on plavix.  Carotid dopplers ok, echocardiogram ok.  Residual deficit: vertigo but this improved greatly with therapy.   DOE (dyspnea on exertion) 2020   DOE and ? hypoxia->CXR 08/29/19--->diffuse interstitial opacities-? acute inflammatory process->Dr. Melvyn Novas eval'd him and was underwhelmed by the CXR and exam->CT chest 09/26/19 "Very subtle areas of mild ground-glass attenuatio-nonspecific", mild air trapping. Per Dr. Melvyn Novas, no signif ILD, improving with inc ambulation (post-inflamm pulm fibrosis?)   Esophagitis 06/14/2020   EGD->+distal esophagitis, h pylori neg, mild distal esoph stenosis widened with forceps, benign gastric polyps.   Gallbladder polyp 2015   Asymptomatic   Gallbladder sludge 2022   Hepatobiliary scan abnl, cystic duct obstructed-->cholecystectomy 07/2021.   GERD (gastroesophageal reflux disease)    Gout    History of adenomatous polyp of colon 2018   Recall 5 yrs   Hyperlipidemia    Hypertension    Hypothyroidism    Lumbar spondylosis    Ptosis due to aging    Right   Rectal bleeding 09/2017   Admitted for obs due to Hb drop.  GI consulted---obs recommended.  No transfusion required.  Plavix was d/c'd, ferrous sulfate recommended.  Outpt GI f/u--plavix restarted and upper endoscopy was normal and colonoscopy showed 2 polyps and severe diverticular dz. Iron d/c'd 04/24/19.   Renal cyst    per pt he got MRI kidneys [facility] the [facility] 12/2020 and the images will be viewed by Dr. Clover Mealy   Restless legs syndrome    S/p cadaver renal transplant 2013   Secondary to HTN +lithium toxicity over 30 yrs caused kidney destruction [facility] transplant MDs)   Seborrheic  dermatitis    eyebrows worst: Hytone rx'd by Dr. Denna Haggard.   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   left jawline(CX35FU), Left forehead (CX35FU) right shoulder (CX35FU), R forearm    Family History  Problem Relation Age of Onset   Ulcers Mother        GI bleed    Heart disease Father    Bladder Cancer Father    Colon cancer Neg Hx    Esophageal cancer Neg Hx    Pancreatic cancer Neg Hx    Rectal cancer Neg Hx    Stomach cancer Neg Hx     SOCIAL HISTORY: Social History   Tobacco Use   Smoking status: Former    Types: Pipe, Cigars    Quit date: 05/11/1974    Years since quitting: 47.4   Smokeless tobacco: Never  Substance Use Topics   Alcohol use: No    Allergies  Allergen Reactions   Amantadine Hcl Itching    REACTION: itching   Chlorpromazine Hcl     REACTION: "fell flat on face"   Grapefruit Flavor [Flavoring Agent] Other (See Comments)    Can't take with medication    Current Outpatient Medications  Medication Sig Dispense Refill   carvedilol (COREG) 12.5 MG tablet TAKE 1/2 (ONE-HALF) TABLET BY MOUTH IN THE MORNING AND 1 IN THE EVENING (Patient taking differently: Take 6.25 mg by mouth 2 (two) times daily with a meal.) 135 tablet 0   clopidogrel (PLAVIX) 75 MG tablet Take 1 tablet by mouth once daily 90 tablet 0   EUTHYROX 25 MCG tablet Take 1 tablet by mouth once daily 30 tablet 0   hydrOXYzine (ATARAX/VISTARIL) 25 MG tablet Take 25 mg by mouth daily.     levothyroxine (EUTHYROX) 25 MCG tablet Take 1 tablet (25 mcg total) by mouth daily. 90 tablet 3   melatonin 5 MG TABS Take 10 mg by mouth at bedtime as needed (sleep).     Multiple Vitamin (MULTIVITAMIN) tablet Take 1 tablet by mouth at bedtime.     mycophenolate (MYFORTIC) 180 MG EC tablet Take 360 mg by mouth 2 (two) times daily.      Omega-3 Fatty Acids (FISH OIL PO) Take 2 g by mouth 2 (two) times daily.     pantoprazole (PROTONIX) 40 MG tablet Take 1 tablet by mouth twice daily 180 tablet 3    risperiDONE (RISPERDAL) 1 MG tablet Take 1 mg by mouth at bedtime.      simvastatin (ZOCOR) 20 MG tablet Take 1 tablet (20 mg total) by mouth daily. 90 tablet 3   sulfamethoxazole-trimethoprim (BACTRIM,SEPTRA) 400-80 MG tablet Take 1 tablet by mouth every Monday, Wednesday, and Friday.     tacrolimus (PROGRAF) 0.5 MG capsule See admin instructions. 2 in the am and 1 at night     traZODone (DESYREL) 50 MG tablet 1-2 tabs po qhs prn insomnia 30 tablet 1   acetaminophen (TYLENOL) 325 MG  tablet Take 650 mg by mouth every 6 (six) hours as needed for mild pain. (Patient not taking: No sig reported)     allopurinol (ZYLOPRIM) 300 MG tablet Take 1 tablet by mouth once daily (Patient not taking: Reported on 09/25/2021) 90 tablet 0   colchicine 0.6 MG tablet Take 1 tablet by mouth daily as needed (Gout). (Patient not taking: No sig reported)     No current facility-administered medications for this visit.    REVIEW OF SYSTEMS:  [X]  denotes positive finding, [ ]  denotes negative finding Cardiac  Comments:  Chest pain or chest pressure:    Shortness of breath upon exertion:    Short of breath when lying flat:    Irregular heart rhythm:        Vascular    Pain in calf, thigh, or hip brought on by ambulation:    Pain in feet at night that wakes you up from your sleep:     Blood clot in your veins:    Leg swelling:         Pulmonary    Oxygen at home:    Productive cough:     Wheezing:         Neurologic    Sudden weakness in arms or legs:     Sudden numbness in arms or legs:     Sudden onset of difficulty speaking or slurred speech:    Temporary loss of vision in one eye:     Problems with dizziness:         Gastrointestinal    Blood in stool:     Vomited blood:         Genitourinary    Burning when urinating:     Blood in urine:        Psychiatric    Major depression:         Hematologic    Bleeding problems:    Problems with blood clotting too easily:        Skin    Rashes or  ulcers:        Constitutional    Fever or chills:     PHYSICAL EXAM:   Vitals:   09/25/21 0934  BP: 117/80  Pulse: 74  Resp: 20  Temp: 97.9 F (36.6 C)  SpO2: 94%  Weight: 182 lb (82.6 kg)  Height: 5\' 9"  (1.753 m)    GENERAL: The patient is a well-nourished male, in no acute distress. The vital signs are documented above. CARDIAC: There is a regular rate and rhythm.  VASCULAR: I do not detect carotid bruits. He has a normal right radial pulse and a slightly diminished left radial pulse. He has mild left upper extremity swelling. PULMONARY: There is good air exchange bilaterally without wheezing or rales. MUSCULOSKELETAL: There are no major deformities or cyanosis. NEUROLOGIC: No focal weakness or paresthesias are detected. SKIN: There are no ulcers or rashes noted. PSYCHIATRIC: The patient has a normal affect.  DATA:    No new data  Deitra Mayo [facility] [phone]

## 2021-09-30 ENCOUNTER — Ambulatory Visit (INDEPENDENT_AMBULATORY_CARE_PROVIDER_SITE_OTHER): Payer: Medicare HMO

## 2021-09-30 ENCOUNTER — Encounter: Payer: Self-pay | Admitting: Family Medicine

## 2021-09-30 ENCOUNTER — Other Ambulatory Visit: Payer: Self-pay

## 2021-09-30 DIAGNOSIS — Z23 Encounter for immunization: Secondary | ICD-10-CM

## 2021-10-02 ENCOUNTER — Ambulatory Visit: Payer: Medicare HMO

## 2021-10-13 ENCOUNTER — Ambulatory Visit: Payer: Medicare HMO | Admitting: Dermatology

## 2021-10-14 ENCOUNTER — Ambulatory Visit: Payer: Medicare HMO | Admitting: Psychiatry

## 2021-10-16 ENCOUNTER — Other Ambulatory Visit: Payer: Self-pay

## 2021-10-16 ENCOUNTER — Encounter: Payer: Self-pay | Admitting: Dermatology

## 2021-10-16 ENCOUNTER — Ambulatory Visit: Payer: Medicare HMO | Admitting: Dermatology

## 2021-10-16 DIAGNOSIS — L219 Seborrheic dermatitis, unspecified: Secondary | ICD-10-CM | POA: Diagnosis not present

## 2021-10-16 DIAGNOSIS — Z1283 Encounter for screening for malignant neoplasm of skin: Secondary | ICD-10-CM

## 2021-10-16 DIAGNOSIS — D0439 Carcinoma in situ of skin of other parts of face: Secondary | ICD-10-CM | POA: Diagnosis not present

## 2021-10-16 DIAGNOSIS — D485 Neoplasm of uncertain behavior of skin: Secondary | ICD-10-CM

## 2021-10-16 DIAGNOSIS — D044 Carcinoma in situ of skin of scalp and neck: Secondary | ICD-10-CM | POA: Diagnosis not present

## 2021-10-16 NOTE — Patient Instructions (Addendum)
Pick up over the counter hydrocortisone ointment for the face between the eyes     Biopsy, Surgery (Curettage) & Surgery (Excision) Aftercare Instructions  1. Okay to remove bandage in 24 hours  2. Wash area with soap and water  3. Apply Vaseline to area twice daily until healed (Not Neosporin)  4. Okay to cover with a Band-Aid to decrease the chance of infection or prevent irritation from clothing; also it's okay to uncover lesion at home.  5. Suture instructions: return to our office in 7-10 or 10-14 days for a nurse visit for suture removal. Variable healing with sutures, if pain or itching occurs call our office. It's okay to shower or bathe 24 hours after sutures are given.  6. The following risks may occur after a biopsy, curettage or excision: bleeding, scarring, discoloration, recurrence, infection (redness, yellow drainage, pain or swelling).  7. For questions, concerns and results call our office at [place] before 4pm & Friday before 3pm. Biopsy results will be available in 1 week.

## 2021-10-20 ENCOUNTER — Other Ambulatory Visit: Payer: Self-pay

## 2021-10-20 ENCOUNTER — Encounter (HOSPITAL_COMMUNITY): Payer: Self-pay | Admitting: Vascular Surgery

## 2021-10-20 NOTE — Progress Notes (Addendum)
[name] denies chest pain or shortness of Breath. Patient denies having any s/s of Covid in his household.  Patient denies any known exposure to Covid.   I instructed patient to shower with antibiotic soap, if it is available.  Dry off with a clean towel. Do not put lotion, powder, cologne or deodorant or makeup.No jewelry or piercings. Men may shave their face and neck. Woman should not shave. No nail polish, artificial or acrylic nails. Wear clean clothes, brush your teeth. Glasses, contact lens,dentures or partials may not be worn in the OR. If you need to wear them, please bring a case for glasses, do not wear contacts or bring a case, the hospital does not have contact cases, dentures or partials will have to be removed , make sure they are clean, we will provide a denture cup to put them in. You will need some one to drive you home and a responsible person over the age of 40 to stay with you for the first 24 hours after surgery.   [name] had a stroke in 2017- he continues to have some balance issues.

## 2021-10-21 ENCOUNTER — Ambulatory Visit (HOSPITAL_COMMUNITY): Payer: Medicare HMO | Admitting: Certified Registered Nurse Anesthetist

## 2021-10-21 ENCOUNTER — Encounter (HOSPITAL_COMMUNITY)
Admission: RE | Disposition: A | Discharge status: Home / Self Care | Payer: Self-pay | Source: Home / Self Care | Attending: Vascular Surgery

## 2021-10-21 ENCOUNTER — Ambulatory Visit (HOSPITAL_COMMUNITY)
Admission: RE | Admit: 2021-10-21 | Discharge: 2021-10-21 | Disposition: A | Discharge status: Home / Self Care | Payer: Medicare HMO | Attending: Vascular Surgery | Admitting: Vascular Surgery

## 2021-10-21 DIAGNOSIS — M7989 Other specified soft tissue disorders: Secondary | ICD-10-CM | POA: Insufficient documentation

## 2021-10-21 DIAGNOSIS — N186 End stage renal disease: Secondary | ICD-10-CM | POA: Diagnosis not present

## 2021-10-21 DIAGNOSIS — X58XXXA Exposure to other specified factors, initial encounter: Secondary | ICD-10-CM | POA: Insufficient documentation

## 2021-10-21 DIAGNOSIS — T82898A Other specified complication of vascular prosthetic devices, implants and grafts, initial encounter: Secondary | ICD-10-CM | POA: Insufficient documentation

## 2021-10-21 DIAGNOSIS — Z940 Kidney transplant status: Secondary | ICD-10-CM | POA: Insufficient documentation

## 2021-10-21 DIAGNOSIS — Z87891 Personal history of nicotine dependence: Secondary | ICD-10-CM | POA: Diagnosis not present

## 2021-10-21 DIAGNOSIS — I120 Hypertensive chronic kidney disease with stage 5 chronic kidney disease or end stage renal disease: Secondary | ICD-10-CM | POA: Diagnosis not present

## 2021-10-21 DIAGNOSIS — Z992 Dependence on renal dialysis: Secondary | ICD-10-CM | POA: Diagnosis not present

## 2021-10-21 DIAGNOSIS — E039 Hypothyroidism, unspecified: Secondary | ICD-10-CM | POA: Diagnosis not present

## 2021-10-21 HISTORY — PX: LIGATION OF ARTERIOVENOUS  FISTULA: SHX5948

## 2021-10-21 LAB — POCT I-STAT, CHEM 8
BUN: 22 mg/dL (ref 8–23)
Calcium, Ion: 1.19 mmol/L (ref 1.15–1.40)
Chloride: 107 mmol/L (ref 98–111)
Creatinine, Ser: 1.7 mg/dL — ABNORMAL HIGH (ref 0.61–1.24)
Glucose, Bld: 99 mg/dL (ref 70–99)
HCT: 39 % (ref 39.0–52.0)
Hemoglobin: 13.3 g/dL (ref 13.0–17.0)
Potassium: 4 mmol/L (ref 3.5–5.1)
Sodium: 141 mmol/L (ref 135–145)
TCO2: 23 mmol/L (ref 22–32)

## 2021-10-21 SURGERY — LIGATION OF ARTERIOVENOUS  FISTULA
Anesthesia: Monitor Anesthesia Care | Site: Arm Upper | Laterality: Left

## 2021-10-21 MED ORDER — ONDANSETRON HCL 4 MG/2ML IJ SOLN
INTRAMUSCULAR | Status: DC | PRN
  Administered 2021-10-21: 4 mg via INTRAVENOUS

## 2021-10-21 MED ORDER — FENTANYL CITRATE (PF) 100 MCG/2ML IJ SOLN
INTRAMUSCULAR | Status: DC | PRN
  Administered 2021-10-21: 50 ug via INTRAVENOUS

## 2021-10-21 MED ORDER — FENTANYL CITRATE (PF) 250 MCG/5ML IJ SOLN
INTRAMUSCULAR | Status: AC
  Filled 2021-10-21: qty 5

## 2021-10-21 MED ORDER — SODIUM CHLORIDE 0.9 % IV SOLN: INTRAVENOUS | Status: DC

## 2021-10-21 MED ORDER — CHLORHEXIDINE GLUCONATE 0.12 % MT SOLN: 15.0000 mL | Freq: Once | OROMUCOSAL | Status: AC

## 2021-10-21 MED ORDER — ONDANSETRON HCL 4 MG/2ML IJ SOLN
INTRAMUSCULAR | Status: AC
  Filled 2021-10-21: qty 2

## 2021-10-21 MED ORDER — LIDOCAINE-EPINEPHRINE (PF) 1 %-1:200000 IJ SOLN
INTRAMUSCULAR | Status: DC | PRN
  Administered 2021-10-21: 12 mL

## 2021-10-21 MED ORDER — CHLORHEXIDINE GLUCONATE 4 % EX LIQD: 60.0000 mL | Freq: Once | CUTANEOUS | Status: DC

## 2021-10-21 MED ORDER — LIDOCAINE 2% (20 MG/ML) 5 ML SYRINGE
INTRAMUSCULAR | Status: AC
  Filled 2021-10-21: qty 5

## 2021-10-21 MED ORDER — HEPARIN 6000 UNIT IRRIGATION SOLUTION
Status: AC
  Filled 2021-10-21: qty 500

## 2021-10-21 MED ORDER — ORAL CARE MOUTH RINSE: 15.0000 mL | Freq: Once | OROMUCOSAL | Status: AC

## 2021-10-21 MED ORDER — PHENYLEPHRINE HCL (PRESSORS) 10 MG/ML IV SOLN
INTRAVENOUS | Status: DC | PRN
  Administered 2021-10-21: 80 ug via INTRAVENOUS

## 2021-10-21 MED ORDER — LACTATED RINGERS IV SOLN: INTRAVENOUS | Status: DC

## 2021-10-21 MED ORDER — PROPOFOL 1000 MG/100ML IV EMUL
INTRAVENOUS | Status: AC
  Filled 2021-10-21: qty 100

## 2021-10-21 MED ORDER — 0.9 % SODIUM CHLORIDE (POUR BTL) OPTIME
TOPICAL | Status: DC | PRN
  Administered 2021-10-21: 1000 mL

## 2021-10-21 MED ORDER — CEFAZOLIN SODIUM-DEXTROSE 2-4 GM/100ML-% IV SOLN
2.0000 g | INTRAVENOUS | Status: AC
  Administered 2021-10-21: 2 g via INTRAVENOUS
  Filled 2021-10-21: qty 100

## 2021-10-21 MED ORDER — PHENYLEPHRINE HCL-NACL 20-0.9 MG/250ML-% IV SOLN
INTRAVENOUS | Status: DC | PRN
  Administered 2021-10-21: 50 ug/min via INTRAVENOUS

## 2021-10-21 MED ORDER — PROPOFOL 500 MG/50ML IV EMUL
INTRAVENOUS | Status: DC | PRN
  Administered 2021-10-21: 50 ug/kg/min via INTRAVENOUS

## 2021-10-21 MED ORDER — PHENYLEPHRINE 40 MCG/ML (10ML) SYRINGE FOR IV PUSH (FOR BLOOD PRESSURE SUPPORT)
PREFILLED_SYRINGE | INTRAVENOUS | Status: AC
  Filled 2021-10-21: qty 10

## 2021-10-21 MED ORDER — LIDOCAINE HCL (CARDIAC) PF 100 MG/5ML IV SOSY
PREFILLED_SYRINGE | INTRAVENOUS | Status: DC | PRN
  Administered 2021-10-21: 2 mL via INTRAVENOUS

## 2021-10-21 MED ORDER — LIDOCAINE-EPINEPHRINE (PF) 1 %-1:200000 IJ SOLN
INTRAMUSCULAR | Status: AC
  Filled 2021-10-21: qty 30

## 2021-10-21 MED ORDER — HEPARIN 6000 UNIT IRRIGATION SOLUTION
Status: DC | PRN
  Administered 2021-10-21: 1

## 2021-10-21 MED ORDER — OXYCODONE HCL 5 MG PO TABS: 5.0000 mg | ORAL_TABLET | ORAL | 0 refills | Status: DC | PRN

## 2021-10-21 MED ORDER — CHLORHEXIDINE GLUCONATE 0.12 % MT SOLN
OROMUCOSAL | Status: AC
  Administered 2021-10-21: 15 mL via OROMUCOSAL
  Filled 2021-10-21: qty 15

## 2021-10-21 SURGICAL SUPPLY — 39 items
BAG COUNTER SPONGE SURGICOUNT (BAG) IMPLANT
CANISTER SUCT 3000ML PPV (MISCELLANEOUS) ×2 IMPLANT
CANNULA VESSEL 3MM 2 BLNT TIP (CANNULA) ×2 IMPLANT
CLIP VESOCCLUDE MED 6/CT (CLIP) ×2 IMPLANT
CLIP VESOCCLUDE SM WIDE 6/CT (CLIP) ×2 IMPLANT
DERMABOND ADVANCED (GAUZE/BANDAGES/DRESSINGS) ×1
DERMABOND ADVANCED .7 DNX12 (GAUZE/BANDAGES/DRESSINGS) ×1 IMPLANT
ELECT REM PT RETURN 9FT ADLT (ELECTROSURGICAL) ×2
ELECTRODE REM PT RTRN 9FT ADLT (ELECTROSURGICAL) ×1 IMPLANT
GAUZE 4X4 16PLY ~~LOC~~+RFID DBL (SPONGE) ×2 IMPLANT
GLOVE SRG 8 PF TXTR STRL LF DI (GLOVE) ×1 IMPLANT
GLOVE SURG ENC MOIS LTX SZ7.5 (GLOVE) ×2 IMPLANT
GLOVE SURG ORTHO LTX SZ7.5 (GLOVE) ×2 IMPLANT
GLOVE SURG POLY ORTHO LF SZ7.5 (GLOVE) IMPLANT
GLOVE SURG UNDER LTX SZ8 (GLOVE) ×2 IMPLANT
GLOVE SURG UNDER POLY LF SZ6.5 (GLOVE) ×6 IMPLANT
GLOVE SURG UNDER POLY LF SZ8 (GLOVE) ×2
GOWN STRL REUS W/ TWL LRG LVL3 (GOWN DISPOSABLE) ×3 IMPLANT
GOWN STRL REUS W/TWL LRG LVL3 (GOWN DISPOSABLE) ×6
KIT BASIN OR (CUSTOM PROCEDURE TRAY) ×2 IMPLANT
KIT TURNOVER KIT B (KITS) ×2 IMPLANT
NEEDLE 18GX1X1/2 (RX/OR ONLY) (NEEDLE) ×2 IMPLANT
NEEDLE HYPO 25GX1X1/2 BEV (NEEDLE) ×2 IMPLANT
NS IRRIG 1000ML POUR BTL (IV SOLUTION) ×2 IMPLANT
PACK CV ACCESS (CUSTOM PROCEDURE TRAY) ×2 IMPLANT
PAD ARMBOARD 7.5X6 YLW CONV (MISCELLANEOUS) ×4 IMPLANT
SPONGE SURGIFOAM ABS GEL 100 (HEMOSTASIS) IMPLANT
SPONGE T-LAP 18X18 ~~LOC~~+RFID (SPONGE) ×2 IMPLANT
SUT ETHILON 3 0 PS 1 (SUTURE) IMPLANT
SUT MNCRL AB 4-0 PS2 18 (SUTURE) ×2 IMPLANT
SUT PROLENE 5 0 C 1 24 (SUTURE) ×2 IMPLANT
SUT PROLENE 6 0 BV (SUTURE) ×2 IMPLANT
SUT SILK 0 TIES 10X30 (SUTURE) ×2 IMPLANT
SUT VIC AB 3-0 SH 27 (SUTURE) ×2
SUT VIC AB 3-0 SH 27X BRD (SUTURE) ×1 IMPLANT
SYR 20ML LL LF (SYRINGE) ×2 IMPLANT
TOWEL GREEN STERILE (TOWEL DISPOSABLE) ×2 IMPLANT
UNDERPAD 30X36 HEAVY ABSORB (UNDERPADS AND DIAPERS) ×2 IMPLANT
WATER STERILE IRR 1000ML POUR (IV SOLUTION) ×2 IMPLANT

## 2021-10-21 NOTE — Op Note (Signed)
    NAME: Curtis Jimenez    MRN: [id number] DOB: [birth year]    DATE OF OPERATION: 10/21/2021  PREOP DIAGNOSIS:    Left arm swelling  POSTOP DIAGNOSIS:    Same  PROCEDURE:    Ligation of left brachiocephalic fistula  SURGEON: Judeth Cornfield. Scot Dock, MD  ASSIST: None  ANESTHESIA: Local with sedation  EBL: Minimal  INDICATIONS:    Curtis Jimenez is a 85 y.o. male who has a large left upper arm fistula which she has not used as he has a functioning kidney transplant.  He was having some issues with arm swelling and wished to have this ligated.  FINDINGS:   Palpable radial pulse at the completion of the procedure.  Prior to ligation he had a dampened monophasic radial signal.  TECHNIQUE:   The patient was taken to the operating room and sedated by anesthesia.  The left upper extremity was prepped and draped in the usual sterile fashion.  After the skin was infiltrated with 1% lidocaine a transverse incision was made over the proximal fistula.  The fistula was markedly dilated.  I dissected this free and ligated with this with two 2-0 silk ties.  Prior to ligation there was a monophasic radial signal with the Doppler.  At the completion there was a palpable radial pulse.  In order to decompress this large dilated segment I made a separate incision in the mid upper arm after the skin was anesthetized.  I ligated here and decompressed the segment in between.  Each of the wounds was closed with a deep layer of 3-0 Vicryl and the skin closed with 4-0 Monocryl.  Dermabond was applied.  The patient tolerated the procedure well was transferred to the recovery room in stable condition.  All needle and sponge counts were correct.    Deitra Mayo, MD, FACS [facility]  DATE OF DICTATION:   10/21/2021

## 2021-10-21 NOTE — Interval H&P Note (Signed)
History and Physical Interval Note:  10/21/2021 7:21 AM  Curtis Jimenez  has presented today for surgery, with the diagnosis of ESRD.  The various methods of treatment have been discussed with the patient and family. After consideration of risks, benefits and other options for treatment, the patient has consented to  Procedure(s): LIGATION OF LEFT ARTERIOVENOUS  FISTULA (Left) as a surgical intervention.  [name] history has been reviewed, patient examined, no change in status, stable for surgery.  I have reviewed the patient's chart and labs.  Questions were answered to the patient's satisfaction.     Deitra Mayo

## 2021-10-21 NOTE — Anesthesia Preprocedure Evaluation (Addendum)
Anesthesia Evaluation  Patient identified by MRN, date of birth, ID band Patient awake    Reviewed: Allergy & Precautions, NPO status , Patient's Chart, lab work & pertinent test results, reviewed documented beta blocker date and time   History of Anesthesia Complications Negative for: history of anesthetic complications  Airway Mallampati: II  TM Distance: >3 FB Neck ROM: Full    Dental  (+) Dental Advisory Given   Pulmonary neg shortness of breath, asthma , neg sleep apnea, neg recent URI, former smoker,    breath sounds clear to auscultation       Cardiovascular hypertension, Pt. on home beta blockers and Pt. on medications + CAD  (-) CHF (-) dysrhythmias  Rhythm:Regular  Left ventricle: The cavity size was mildly dilated. Systolic  function was normal. The estimated ejection fraction was in the  range of 55% to 60%. Wall motion was normal; there were no  regional wall motion abnormalities. There was an increased  relative contribution of atrial contraction to ventricular  filling. Doppler parameters are consistent with abnormal left  ventricular relaxation (grade 1 diastolic dysfunction).  - Aorta: Aortic root dimension: 38 mm (ED).  - Aortic root: The aortic root was mildly dilated.  - Mitral valve: There was mild regurgitation.    Neuro/Psych CVA 2017: residual balance concerns CVA, Residual Symptoms    GI/Hepatic Neg liver ROS, GERD  ,  Endo/Other  Hypothyroidism   Renal/GU Renal diseaseLab Results      Component                Value               Date                      CREATININE               1.70 (H)            10/21/2021           Lab Results      Component                Value               Date                      K                        4.0                 10/21/2021            S/p kidney transplant 2013     Musculoskeletal  (+) Arthritis ,   Abdominal   Peds  Hematology Lab  Results      Component                Value               Date                      WBC                      5.2                 08/07/2021                HGB  13.3                10/21/2021                HCT                      39.0                10/21/2021                MCV                      91.7                08/07/2021                PLT                      214                 08/07/2021            Plavix for CVA: held for surgery   Anesthesia Other Findings   Reproductive/Obstetrics                            Anesthesia Physical Anesthesia Plan  ASA: 3  Anesthesia Plan: MAC   Post-op Pain Management:    Induction: Intravenous  PONV Risk Score and Plan: 1  Airway Management Planned: Nasal Cannula  Additional Equipment: None  Intra-op Plan:   Post-operative Plan:   Informed Consent: I have reviewed the patients History and Physical, chart, labs and discussed the procedure including the risks, benefits and alternatives for the proposed anesthesia with the patient or authorized representative who has indicated his/her understanding and acceptance.     Dental advisory given  Plan Discussed with: CRNA and Anesthesiologist  Anesthesia Plan Comments:         Anesthesia Quick Evaluation

## 2021-10-21 NOTE — Transfer of Care (Signed)
Immediate Anesthesia Transfer of Care Note  Patient: Curtis Jimenez  Procedure(s) Performed: LIGATION OF LEFT ARTERIOVENOUS  FISTULA (Left: Arm Upper)  Patient Location: PACU  Anesthesia Type:MAC  Level of Consciousness: awake, patient cooperative and responds to stimulation  Airway & Oxygen Therapy: Patient Spontanous Breathing  Post-op Assessment: Report given to RN and Post -op Vital signs reviewed and stable  Post vital signs: Reviewed and stable  Last Vitals:  Vitals Value Taken Time  BP 113/60 10/21/21 0837  Temp    Pulse 59 10/21/21 0837  Resp 15 10/21/21 0837  SpO2 96 % 10/21/21 0837  Vitals shown include unvalidated device data.  Last Pain:  Vitals:   10/21/21 0615  TempSrc:   PainSc: 0-No pain         Complications: No notable events documented.

## 2021-10-22 ENCOUNTER — Encounter (HOSPITAL_COMMUNITY): Payer: Self-pay | Admitting: Vascular Surgery

## 2021-10-22 DIAGNOSIS — H524 Presbyopia: Secondary | ICD-10-CM | POA: Diagnosis not present

## 2021-10-22 DIAGNOSIS — H02831 Dermatochalasis of right upper eyelid: Secondary | ICD-10-CM | POA: Diagnosis not present

## 2021-10-22 DIAGNOSIS — H35363 Drusen (degenerative) of macula, bilateral: Secondary | ICD-10-CM | POA: Diagnosis not present

## 2021-10-22 DIAGNOSIS — Z961 Presence of intraocular lens: Secondary | ICD-10-CM | POA: Diagnosis not present

## 2021-10-22 NOTE — Anesthesia Postprocedure Evaluation (Signed)
Anesthesia Post Note  Patient: Curtis Jimenez  Procedure(s) Performed: LIGATION OF LEFT ARTERIOVENOUS  FISTULA (Left: Arm Upper)     Patient location during evaluation: PACU Anesthesia Type: MAC Level of consciousness: awake and alert Pain management: pain level controlled Vital Signs Assessment: post-procedure vital signs reviewed and stable Respiratory status: spontaneous breathing, nonlabored ventilation, respiratory function stable and patient connected to nasal cannula oxygen Cardiovascular status: stable and blood pressure returned to baseline Postop Assessment: no apparent nausea or vomiting Anesthetic complications: no   No notable events documented.  Last Vitals:  Vitals:   10/21/21 0845 10/21/21 0853  BP:  122/68  Pulse: (!) 57 (!) 54  Resp: 18 11  Temp:  36.6 C  SpO2: 93% 96%    Last Pain:  Vitals:   10/21/21 0615  TempSrc:   PainSc: 0-No pain                 Madisen Ludvigsen

## 2021-10-29 ENCOUNTER — Ambulatory Visit (HOSPITAL_COMMUNITY)
Admission: RE | Admit: 2021-10-29 | Discharge: 2021-10-29 | Disposition: A | Discharge status: Home / Self Care | Payer: Medicare HMO | Source: Ambulatory Visit | Attending: Vascular Surgery | Admitting: Vascular Surgery

## 2021-10-29 ENCOUNTER — Other Ambulatory Visit: Payer: Self-pay

## 2021-10-29 ENCOUNTER — Telehealth: Payer: Self-pay

## 2021-10-29 DIAGNOSIS — M7989 Other specified soft tissue disorders: Secondary | ICD-10-CM

## 2021-10-29 NOTE — Telephone Encounter (Signed)
Pt called with c/o sudden onset of pain, swelling of arm and hand and pink color near incision that started yesterday. MD aware. Pt is scheduled for venous duplex today. Pt advised to elevate arm above heart level.

## 2021-11-02 ENCOUNTER — Encounter: Payer: Self-pay | Admitting: Dermatology

## 2021-11-02 NOTE — Progress Notes (Signed)
Follow-Up Visit   Subjective  Curtis Jimenez is a 85 y.o. male who presents for the following: Annual Exam (Left forehead still scabs over still and left cheek new lesion).  Nonhealing crusts on upper forehead and left cheek, scale on forehead Location:  Duration:  Quality:  Associated Signs/Symptoms: Modifying Factors:  Severity:  Timing: Context:   Objective  Well appearing patient in no apparent distress; mood and affect are within normal limits. Glabella Subtle erythema and scale  Left Parotid Area, Mid Frontal Scalp 1.2 cm and 2.2cm crusts suggestive of carcinoma in situ.         A focused examination was performed including head and neck. Relevant physical exam findings are noted in the Assessment and Plan.   Assessment & Plan    Seborrheic dermatitis Glabella  Over the counter hydrocortisone cream nightly for 2 weeks then taper if improved.  Carcinoma in situ of skin of other part of face (2) Mid Frontal Scalp; Left Parotid Area  After shave biopsy the base of each lesion was treated with curettage plus cautery.  Skin / nail biopsy - Mid Frontal Scalp Type of biopsy: tangential   Informed consent: discussed and consent obtained   Timeout: patient name, date of birth, surgical site, and procedure verified   Anesthesia: the lesion was anesthetized in a standard fashion   Anesthetic:  1% lidocaine w/ epinephrine 1-100,000 local infiltration Instrument used: flexible razor blade   Hemostasis achieved with: aluminum chloride and electrodesiccation   Outcome: patient tolerated procedure well   Post-procedure details: wound care instructions given    Destruction of lesion - Mid Frontal Scalp Complexity: simple   Destruction method: electrodesiccation and curettage   Informed consent: discussed and consent obtained   Timeout:  patient name, date of birth, surgical site, and procedure verified Anesthesia: the lesion was anesthetized in a standard  fashion   Anesthetic:  1% lidocaine w/ epinephrine 1-100,000 local infiltration Curettage performed in three different directions: Yes   Curettage cycles:  3 Lesion length (cm):  3 Lesion width (cm):  3 Margin per side (cm):  0 Final wound size (cm):  3 Hemostasis achieved with:  ferric subsulfate Outcome: patient tolerated procedure well with no complications   Additional details:  Wound innoculated with 5 fluorouracil solution.  Skin / nail biopsy - Left Parotid Area Type of biopsy: tangential   Informed consent: discussed and consent obtained   Timeout: patient name, date of birth, surgical site, and procedure verified   Procedure prep:  Patient was prepped and draped in usual sterile fashion (Non sterile) Prep type:  Chlorhexidine Anesthesia: the lesion was anesthetized in a standard fashion   Anesthetic:  1% lidocaine w/ epinephrine 1-100,000 local infiltration Instrument used: flexible razor blade   Outcome: patient tolerated procedure well   Post-procedure details: wound care instructions given    Destruction of lesion - Left Parotid Area Complexity: simple   Destruction method: electrodesiccation and curettage   Informed consent: discussed and consent obtained   Timeout:  patient name, date of birth, surgical site, and procedure verified Anesthesia: the lesion was anesthetized in a standard fashion   Anesthetic:  1% lidocaine w/ epinephrine 1-100,000 local infiltration Curettage performed in three different directions: Yes   Curettage cycles:  3 Lesion length (cm):  2 Lesion width (cm):  2 Margin per side (cm):  0 Final wound size (cm):  2 Hemostasis achieved with:  ferric subsulfate Outcome: patient tolerated procedure well with no complications   Additional  details:  Wound innoculated with 5 fluorouracil solution.  Specimen 1 - Surgical pathology Differential Diagnosis: bcc scc cx3 73fu  Check Margins: No  Specimen 2 - Surgical pathology Differential Diagnosis:  bcc scc cx3 69fu  Check Margins: No      I, Lavonna Monarch, MD, have reviewed all documentation for this visit.  The documentation on 11/02/21 for the exam, diagnosis, procedures, and orders are all accurate and complete.

## 2021-11-08 ENCOUNTER — Other Ambulatory Visit: Payer: Self-pay | Admitting: Family Medicine

## 2021-11-10 ENCOUNTER — Telehealth: Payer: Self-pay

## 2021-11-10 NOTE — Telephone Encounter (Signed)
Patient states his blood pressure ready is elevated. 140 / 90.  His usually runs 120/ 60.  Please advise. [phone]

## 2021-11-11 NOTE — Telephone Encounter (Signed)
LM for pt to returncall

## 2021-11-11 NOTE — Telephone Encounter (Signed)
During pt's last o/v in Sept, his bp was 101/65. Mentioned in OV 3) HTN: stable on 1/2 of 12.5mg  coreg tab qAM and whole tab qPM   Please review and advise

## 2021-11-11 NOTE — Telephone Encounter (Signed)
Need for info than just 1 bp. It may be best if he just makes appt in person and we'll review things.  No changes recommended at this time, though.

## 2021-11-12 NOTE — Telephone Encounter (Signed)
Pt advised of recommendations. Appt scheduled

## 2021-11-13 ENCOUNTER — Ambulatory Visit: Payer: Medicare HMO | Admitting: Psychiatry

## 2021-11-13 ENCOUNTER — Other Ambulatory Visit: Payer: Self-pay

## 2021-11-13 ENCOUNTER — Encounter: Payer: Self-pay | Admitting: Psychiatry

## 2021-11-13 DIAGNOSIS — F5105 Insomnia due to other mental disorder: Secondary | ICD-10-CM

## 2021-11-13 DIAGNOSIS — F319 Bipolar disorder, unspecified: Secondary | ICD-10-CM

## 2021-11-13 DIAGNOSIS — R69 Illness, unspecified: Secondary | ICD-10-CM | POA: Diagnosis not present

## 2021-11-13 NOTE — Progress Notes (Signed)
Curtis Jimenez [id number] [birth year] 85 y.o.  Subjective:   Patient ID:  Curtis Jimenez is a 85 y.o. (DOB 06-07-1936) male.  Chief Complaint:  Chief Complaint  Patient presents with   Follow-up    Bipolar I disorder [facility])    HPI Curtis Jimenez presents to the office today for follow-up of bipolar and sleep.  seen September 2019.  Continued on risperidone 1 mg HS which was reduced from 2 mg previously.  October 2020 appointment with the following noted: 2-3 times weekly initial insomnia.  Other days sleep plenty of hours and naps 2 hours.  2 days ago skipped naps and still couldn't go to sleep.  Asks about melatonin. Plan: No med changes today except for the addition of melatonin.  10/15/2020 appointment with the following noted: Has split leaf philodendron from 40 years ago. Doing excellent except for bad working dreams that are detailed with racing thoughts and anxiety in the dream.  Awakens for 30 min. A couple times per week. Very stable since out of a stressful job and marriage. Plan: No med changes today except for the addition of melatonin. Ok melatonin 5 mg 30 min before sleep  11/13/21 appt noted: Cholecystectomy.   Mood has been fantastic. Still not sleeping well.  EFA.  Melatonin didn't help much.  Typical in bed from MN until 10 and then 2 hours.   No SE.  No mood swings. Stays occupied.  Lunch club 25 years. Legs never recovered strength after kidney transplant 2012-11-07.  Patient reports stable mood and denies depressed or irritable moods.  Patient denies any recent difficulty with anxiety.  Patient denies difficulty with sleep initiation but 12 hours  Feels rested after sleep.. Denies appetite disturbance.  Patient reports that energy and motivation have been good.  Patient denies any difficulty with concentration.  Patient denies any suicidal ideation.  Past Psychiatric Medication Trials: Lithium, VPA, olanzapine, lamotrigine, clonazepam,  risperidone, Geodon  Review of Systems:  Review of Systems  Respiratory:  Positive for shortness of breath.   Cardiovascular:  Negative for chest pain.  Gastrointestinal:  Negative for vomiting.  Neurological:  Negative for tremors and weakness.   Medications: I have reviewed the patient's current medications.  Current Outpatient Medications  Medication Sig Dispense Refill   acetaminophen (TYLENOL) 325 MG tablet Take 650 mg by mouth every 6 (six) hours as needed for mild pain.     allopurinol (ZYLOPRIM) 300 MG tablet Take 1 tablet by mouth once daily 90 tablet 0   carvedilol (COREG) 12.5 MG tablet TAKE 1/2 (ONE-HALF) TABLET BY MOUTH IN THE MORNING AND 1 IN THE EVENING (Patient taking differently: Take 6.25 mg by mouth 2 (two) times daily with a meal.) 135 tablet 0   clopidogrel (PLAVIX) 75 MG tablet Take 1 tablet by mouth once daily 90 tablet 0   hydrOXYzine (ATARAX/VISTARIL) 25 MG tablet Take 25 mg by mouth in the morning.     levothyroxine (EUTHYROX) 25 MCG tablet Take 1 tablet (25 mcg total) by mouth daily. (Patient taking differently: Take 25 mcg by mouth daily at 2 am. (0400)) 90 tablet 3   Multiple Vitamin (MULTIVITAMIN WITH MINERALS) TABS tablet Take 1 tablet by mouth at bedtime.     mycophenolate (MYFORTIC) 180 MG EC tablet Take 360 mg by mouth 2 (two) times daily.      Omega-3 Fatty Acids (FISH OIL) 1000 MG CAPS Take 2,000 mg by mouth in the morning and at bedtime.     pantoprazole (PROTONIX)  40 MG tablet Take 1 tablet by mouth twice daily (Patient taking differently: Take 40 mg by mouth daily before breakfast.) 180 tablet 3   risperiDONE (RISPERDAL) 1 MG tablet Take 1 mg by mouth at bedtime.      simvastatin (ZOCOR) 20 MG tablet Take 1 tablet (20 mg total) by mouth daily. (Patient taking differently: Take 20 mg by mouth at bedtime.) 90 tablet 3   sulfamethoxazole-trimethoprim (BACTRIM,SEPTRA) 400-80 MG tablet Take 1 tablet by mouth every Monday, Wednesday, and Friday. In the  morning     tacrolimus (PROGRAF) 0.5 MG capsule Take 0.5-1 mg by mouth See admin instructions. Take 2 capsules (1 mg) by mouth in the morning & take 1 capsule (0.5 mg) by mouth at night.     traZODone (DESYREL) 50 MG tablet 1-2 tabs po qhs prn insomnia 30 tablet 1   amoxicillin (AMOXIL) 500 MG capsule Take 1,000 mg by mouth See admin instructions. Take 2 capsules (1000 mg) by mouth 1 hour before dental appointment & take 2 capsules (1000 mg) by mouth immediately following dental appointment (Patient not taking: Reported on 11/13/2021)     oxyCODONE (ROXICODONE) 5 MG immediate release tablet Take 1 tablet (5 mg total) by mouth every 4 (four) hours as needed. (Patient not taking: Reported on 11/13/2021) 15 tablet 0   No current facility-administered medications for this visit.    Medication Side Effects: None  Allergies:  Allergies  Allergen Reactions   Amantadine Hcl Itching   Chlorpromazine Hcl Other (See Comments)    "fell flat on face"   Grapefruit Flavor [Flavoring Agent] Other (See Comments)    Can't take with medication    Past Medical History:  Diagnosis Date   Basal cell carcinoma    neck (skin MD 2X per year)   Bifascicular block 2021   ectopy, asymptomatic.  Cards->observe   Bipolar disorder (HCC)    BPH (benign prostatic hyperplasia)    with elevated PSA; followed by Dr. Rosana Hoes at [facility]   Chronic renal insufficiency, stage III (moderate) ([place])    in pt w/hx of renal transplant.  Post-transplant sCr 1.4-1.6.   Coronary atherosclerosis    LM and 3 V dz noted on CT 08/2019 performed for eval of interstitial lung dz in the setting of mild DOE and mild hypoxia. Cards->no stress tesing indicated->primary prevention emphasized   CVA (cerebral vascular accident) ([place]) 11/2016   Pontine (vertebrobasilar--imaging neg), TPA given.  Pt discharged on plavix.  Carotid dopplers ok, echocardiogram ok.  Residual deficit: vertigo but this improved greatly with therapy.   DOE  (dyspnea on exertion) 2020   DOE and ? hypoxia->CXR 08/29/19--->diffuse interstitial opacities-? acute inflammatory process->Dr. Melvyn Novas eval'd him and was underwhelmed by the CXR and exam->CT chest 09/26/19 "Very subtle areas of mild ground-glass attenuatio-nonspecific", mild air trapping. Per Dr. Melvyn Novas, no signif ILD, improving with inc ambulation (post-inflamm pulm fibrosis?)   Esophagitis 06/14/2020   EGD->+distal esophagitis, h pylori neg, mild distal esoph stenosis widened with forceps, benign gastric polyps.   Gallbladder polyp 2015   Asymptomatic   Gallbladder sludge 2022   Hepatobiliary scan abnl, cystic duct obstructed-->cholecystectomy 07/2021.   GERD (gastroesophageal reflux disease)    Gout    History of adenomatous polyp of colon 2018   Recall 5 yrs   Hyperlipidemia    Hypertension    Hypothyroidism    Lumbar spondylosis    Ptosis due to aging    Right   Rectal bleeding 09/2017   Admitted for obs due  to Hb drop.  GI consulted---obs recommended.  No transfusion required.  Plavix was d/c'd, ferrous sulfate recommended.  Outpt GI f/u--plavix restarted and upper endoscopy was normal and colonoscopy showed 2 polyps and severe diverticular dz. Iron d/c'd 04/24/19.   Renal cyst    per pt he got MRI kidneys at [facility] 12/2020 and the images will be viewed by Dr. Clover Mealy   Restless legs syndrome    S/p cadaver renal transplant 2013   Secondary to HTN +lithium toxicity over 30 yrs caused kidney destruction [facility] transplant MDs)   Seborrheic dermatitis    eyebrows worst: Hytone rx'd by Dr. Denna Haggard.   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   left jawline(CX35FU), Left forehead (CX35FU) right shoulder (CX35FU), R forearm    Family History  Problem Relation Age of Onset   Ulcers Mother        GI bleed    Heart disease Father    Bladder Cancer Father    Colon cancer Neg Hx    Esophageal cancer Neg Hx    Pancreatic cancer Neg Hx    Rectal cancer Neg Hx    Stomach  cancer Neg Hx     Social History   Socioeconomic History   Marital status: Married    Spouse name: Not on file   Number of children: Not on file   Years of education: Not on file   Highest education level: Not on file  Occupational History   Not on file  Tobacco Use   Smoking status: Former    Types: Pipe, Cigars    Quit date: 05/11/1974    Years since quitting: 47.5   Smokeless tobacco: Never  Vaping Use   Vaping Use: Never used  Substance and Sexual Activity   Alcohol use: No   Drug use: No   Sexual activity: Not Currently    Partners: Female  Other Topics Concern   Not on file  Social History Narrative   Married, 4 children, 9 GGC, [place].   Occupation: retired Marine scientist.  Originally from [place] [employer].   Tobacco: quit 1975; smoked pipes and cigars x 15 yrs prior to this.   Alcohol: none.   Exercise: minimal, but is going to sign up for silver sneakers at the [facility].   Social Determinants of Health   Financial Resource Strain: Low Risk    Difficulty of Paying Living Expenses: Not hard at all  Food Insecurity: No Food Insecurity   Worried About Charity fundraiser in the Last Year: Never true   [place] in the Last Year: Never true  Transportation Needs: No Transportation Needs   Lack of Transportation (Medical): No   Lack of Transportation (Non-Medical): No  Physical Activity: Insufficiently Active   Days of Exercise per Week: 1 day   Minutes of Exercise per Session: 30 min  Stress: No Stress Concern Present   Feeling of Stress : Not at all  Social Connections: Socially Integrated   Frequency of Communication with Friends and Family: More than three times a week   Frequency of Social Gatherings with Friends and Family: More than three times a week   Attends Religious Services: More than 4 times per year   Active Member of [employer] or Organizations: Yes   Attends Music therapist: More than 4 times per year   Marital Status: Married  Arboriculturist Violence: Not At Risk   Fear of Current or Ex-Partner: No   Emotionally Abused:  No   Physically Abused: No   Sexually Abused: No    Past Medical History, Surgical history, Social history, and Family history were reviewed and updated as appropriate.   Please see review of systems for further details on the patient's review from today.   Objective:   Physical Exam:  There were no vitals taken for this visit.  Physical Exam Constitutional:      General: He is not in acute distress.    Appearance: He is well-developed.  Musculoskeletal:        General: No deformity.  Neurological:     Mental Status: He is alert and oriented to person, place, and time.     Coordination: Coordination normal.  Psychiatric:        Attention and Perception: Attention and perception normal. He does not perceive auditory or visual hallucinations.        Mood and Affect: Mood normal. Mood is not anxious or depressed. Affect is not labile, blunt, angry or inappropriate.        Speech: Speech normal. Speech is not rapid and pressured.        Behavior: Behavior normal.        Thought Content: Thought content normal. Thought content does not include homicidal or suicidal ideation.        Cognition and Memory: Cognition and memory normal.        Judgment: Judgment normal.     Comments: Insight intact. No delusions.  No mania. No AIM.    Lab Review:     Component Value Date/Time   NA 141 10/21/2021 0611   NA 139 11/28/2020 0000   K 4.0 10/21/2021 0611   CL 107 10/21/2021 0611   CO2 26 08/07/2021 1341   GLUCOSE 99 10/21/2021 0611   GLUCOSE 103 (H) 12/15/2006 1045   BUN 22 10/21/2021 0611   BUN 23 (A) 11/28/2020 0000   CREATININE 1.70 (H) 10/21/2021 0611   CREATININE 1.63 (H) 01/21/2021 1025   CALCIUM 9.2 08/07/2021 1341   CALCIUM 9.5 12/06/2010 0046   PROT 6.5 06/04/2021 0933   ALBUMIN 3.9 06/04/2021 0933   AST 14 06/04/2021 0933   ALT 9 06/04/2021 0933   ALKPHOS 53 06/04/2021 0933    BILITOT 0.8 06/04/2021 0933   GFRNONAA 45 (L) 08/07/2021 1341   GFRAA 38 11/28/2020 0000       Component Value Date/Time   WBC 5.2 08/07/2021 1341   RBC 4.72 08/07/2021 1341   HGB 13.3 10/21/2021 0611   HCT 39.0 10/21/2021 0611   PLT 214 08/07/2021 1341   MCV 91.7 08/07/2021 1341   MCH 28.2 08/07/2021 1341   MCHC 30.7 08/07/2021 1341   RDW 13.7 08/07/2021 1341   LYMPHSABS 0.8 06/04/2021 0933   MONOABS 0.7 06/04/2021 0933   EOSABS 0.1 06/04/2021 0933   BASOSABS 0.1 06/04/2021 0933    No results found for: POCLITH, LITHIUM   No results found for: PHENYTOIN, PHENOBARB, VALPROATE, CBMZ   .res Assessment: Plan:    Curtis Jimenez was seen today for follow-up.  Diagnoses and all orders for this visit:  Bipolar I disorder ([place])  Insomnia due to mental condition   Patient has a history of kidney transplant.  He has been stable on risperidone for several years for bipolar disorder.  He has had previous manic episodes but none in several years.  We have reduced the risperidone to the lowest effective dose.  As noted he has been on alternatives.  He is satisfied with the current  meds.  Discussed potential metabolic side effects associated with atypical antipsychotics, as well as potential risk for movement side effects. Advised pt to contact office if movement side effects occur.  Continue risperidone 1 mg HS Continue trazodone 50 mg HS  No med changes today except for the addition of melatonin.  Extensive disc of sleep hygiene.  In bed way too much to sleep that much. Really doesn't have insomnia bc of this.  FU 1 year bc stable for years  Lynder Parents, MD, DFAPA  Please see After Visit Summary for patient specific instructions.  Future Appointments  Date Time Provider [place]  11/14/2021 10:30 AM McGowen, Adrian Blackwater, MD [facility]  01/20/2022 10:00 AM McGowen, Adrian Blackwater, MD [facility]  02/17/2022 10:15 AM Lavonna Monarch, MD [facility]    No orders of the defined  types were placed in this encounter.   -------------------------------

## 2021-11-14 ENCOUNTER — Ambulatory Visit (INDEPENDENT_AMBULATORY_CARE_PROVIDER_SITE_OTHER): Payer: Medicare HMO | Admitting: Family Medicine

## 2021-11-14 ENCOUNTER — Encounter: Payer: Self-pay | Admitting: Family Medicine

## 2021-11-14 VITALS — BP 103/65 | HR 77 | Temp 97.5°F | Ht 69.0 in | Wt 176.8 lb

## 2021-11-14 DIAGNOSIS — N1831 Chronic kidney disease, stage 3a: Secondary | ICD-10-CM | POA: Diagnosis not present

## 2021-11-14 DIAGNOSIS — I959 Hypotension, unspecified: Secondary | ICD-10-CM

## 2021-11-14 DIAGNOSIS — I10 Essential (primary) hypertension: Secondary | ICD-10-CM | POA: Diagnosis not present

## 2021-11-14 NOTE — Progress Notes (Signed)
OFFICE VISIT  11/14/2021  CC:  Chief Complaint  Patient presents with   Elevated Blood Pressure   HPI:    Patient is a 85 y.o. male with hx of HTN who presents for f/u HTN.  I last saw him 09/17/21 and all was stable, continued pt on coreg 6.25 qAM and 6.25 qPM.  INTERIM HX: He got ligation of L AV fistula on 10/21/21 by Dr. Scot Dock.  He says he had a couple of episodes of feeling dizzy when standing up about a week ago.  He checked his blood pressure that day and it was 1 3495.  His usual home blood pressures 120s over 60s.  Subsequent checks over the next few days or consistently 130s to 140s over 80s to 105 ( most recent check was this morning at home).  Pulse average in the 80s. He no longer felt any dizziness after that first day. He otherwise feels fine.  Denies any fevers, URI symptoms, cough, chest pain, shortness of breath, or changes in lower extremities. He has continued Coreg 6.25 mg twice a day.   Past Medical History:  Diagnosis Date   Basal cell carcinoma    neck (skin MD 2X per year)   Bifascicular block 2021   ectopy, asymptomatic.  Cards->observe   Bipolar disorder (HCC)    BPH (benign prostatic hyperplasia)    with elevated PSA; followed by Dr. Rosana Hoes at [facility]   Chronic renal insufficiency, stage III (moderate) ([place])    in pt w/hx of renal transplant.  Post-transplant sCr 1.4-1.6.   Coronary atherosclerosis    LM and 3 V dz noted on CT 08/2019 performed for eval of interstitial lung dz in the setting of mild DOE and mild hypoxia. Cards->no stress tesing indicated->primary prevention emphasized   CVA (cerebral vascular accident) ([place]) 11/2016   Pontine (vertebrobasilar--imaging neg), TPA given.  Pt discharged on plavix.  Carotid dopplers ok, echocardiogram ok.  Residual deficit: vertigo but this improved greatly with therapy.   DOE (dyspnea on exertion) 2020   DOE and ? hypoxia->CXR 08/29/19--->diffuse interstitial opacities-? acute inflammatory  process->Dr. Melvyn Novas eval'd him and was underwhelmed by the CXR and exam->CT chest 09/26/19 "Very subtle areas of mild ground-glass attenuatio-nonspecific", mild air trapping. Per Dr. Melvyn Novas, no signif ILD, improving with inc ambulation (post-inflamm pulm fibrosis?)   Esophagitis 06/14/2020   EGD->+distal esophagitis, h pylori neg, mild distal esoph stenosis widened with forceps, benign gastric polyps.   Gallbladder polyp 2015   Asymptomatic   Gallbladder sludge 2022   Hepatobiliary scan abnl, cystic duct obstructed-->cholecystectomy 07/2021.   GERD (gastroesophageal reflux disease)    Gout    History of adenomatous polyp of colon 2018   Recall 5 yrs   Hyperlipidemia    Hypertension    Hypothyroidism    Lumbar spondylosis    Ptosis due to aging    Right   Rectal bleeding 09/2017   Admitted for obs due to Hb drop.  GI consulted---obs recommended.  No transfusion required.  Plavix was d/c'd, ferrous sulfate recommended.  Outpt GI f/u--plavix restarted and upper endoscopy was normal and colonoscopy showed 2 polyps and severe diverticular dz. Iron d/c'd 04/24/19.   Renal cyst    per pt he got MRI kidneys at the [facility] 12/2020 and the images will be viewed by Dr. Clover Mealy   Restless legs syndrome    S/p cadaver renal transplant 2013   Secondary to HTN +lithium toxicity over 30 yrs caused kidney destruction [facility] transplant MDs)  Seborrheic dermatitis    eyebrows worst: Hytone rx'd by Dr. Denna Haggard.   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   left jawline(CX35FU), Left forehead (CX35FU) right shoulder (CX35FU), R forearm    Past Surgical History:  Procedure Laterality Date   AV FISTULA PLACEMENT  04/08/2012   Procedure: ARTERIOVENOUS (AV) FISTULA CREATION;  Surgeon: Angelia Mould, MD;  Location: [place];  Service: Vascular;  Laterality: Left;  Creation of left brachial cephalic arteriovenous fistula   BIOPSY  06/14/2020   Procedure: BIOPSY;  Surgeon: Lavena Bullion, DO;   [facility];  Service: Gastroenterology;;   CATARACT EXTRACTION, BILATERAL  05/2018   CHOLECYSTECTOMY N/A 08/18/2021   Procedure: LAPAROSCOPIC CHOLECYSTECTOMY WITH CHOLANGIOGRAM;  Surgeon: Armandina Gemma, MD;  [facility];  Service: General;  Laterality: N/A;   COLONOSCOPY  2013; 11/24/17   2013; Normal except diverticulosis (recall 10 yrs).  2018 (for GI bleeding)--severe diverticular dz + 2 adenomatous polyps.  Recall 5 yrs.   DG CHEST AP OR PA ([facility] HX)  06/03/2020   Bibasilar atelectasis. No focal consoliadation definitively identified.   dilation for GERD     ESOPHAGOGASTRODUODENOSCOPY  11/24/2017   gastric polyps x 2, otherwise normal.   ESOPHAGOGASTRODUODENOSCOPY N/A 06/14/2020   +distal esophagitis, h pylori neg, mild distal esoph stenosis widened with forceps, benign gastric polypsProcedure: ESOPHAGOGASTRODUODENOSCOPY (EGD);  Surgeon: Lavena Bullion, DO;  [facility];  Service: Gastroenterology;  Laterality: N/A;  Diagnostic EGD as patietn last took Plavix 11am on 06/12/2020   KIDNEY TRANSPLANT  11/06/12   [facility] (cadaveric)   LIGATION OF ARTERIOVENOUS  FISTULA Left 10/21/2021   Procedure: LIGATION OF LEFT ARTERIOVENOUS  FISTULA;  Surgeon: Angelia Mould, MD;  Location: [place];  Service: Vascular;  Laterality: Left;   PROSTATE BIOPSY  2011   no malignancy   TRANSTHORACIC ECHOCARDIOGRAM  11/2016   Normal LV systolic fxn, EF 93-81%.  Grade I DD.  Mild aortic root dilatation, mild MV regurg.    Outpatient Medications Prior to Visit  Medication Sig Dispense Refill   acetaminophen (TYLENOL) 325 MG tablet Take 650 mg by mouth every 6 (six) hours as needed for mild pain.     allopurinol (ZYLOPRIM) 300 MG tablet Take 1 tablet by mouth once daily 90 tablet 0   carvedilol (COREG) 12.5 MG tablet TAKE 1/2 (ONE-HALF) TABLET BY MOUTH IN THE MORNING AND 1 IN THE EVENING (Patient taking differently: Take 6.25 mg by mouth 2 (two) times daily with a meal.) 135  tablet 0   clopidogrel (PLAVIX) 75 MG tablet Take 1 tablet by mouth once daily 90 tablet 0   hydrOXYzine (ATARAX/VISTARIL) 25 MG tablet Take 25 mg by mouth in the morning.     levothyroxine (EUTHYROX) 25 MCG tablet Take 1 tablet (25 mcg total) by mouth daily. (Patient taking differently: Take 25 mcg by mouth daily at 2 am. (0400)) 90 tablet 3   Multiple Vitamin (MULTIVITAMIN WITH MINERALS) TABS tablet Take 1 tablet by mouth at bedtime.     mycophenolate (MYFORTIC) 180 MG EC tablet Take 360 mg by mouth 2 (two) times daily.      Omega-3 Fatty Acids (FISH OIL) 1000 MG CAPS Take 2,000 mg by mouth in the morning and at bedtime.     oxyCODONE (ROXICODONE) 5 MG immediate release tablet Take 1 tablet (5 mg total) by mouth every 4 (four) hours as needed. 15 tablet 0   pantoprazole (PROTONIX) 40 MG tablet Take 1 tablet by mouth twice daily (  Patient taking differently: Take 40 mg by mouth daily before breakfast.) 180 tablet 3   risperiDONE (RISPERDAL) 1 MG tablet Take 1 mg by mouth at bedtime.      simvastatin (ZOCOR) 20 MG tablet Take 1 tablet (20 mg total) by mouth daily. (Patient taking differently: Take 20 mg by mouth at bedtime.) 90 tablet 3   sulfamethoxazole-trimethoprim (BACTRIM,SEPTRA) 400-80 MG tablet Take 1 tablet by mouth every Monday, Wednesday, and Friday. In the morning     tacrolimus (PROGRAF) 0.5 MG capsule Take 0.5-1 mg by mouth See admin instructions. Take 2 capsules (1 mg) by mouth in the morning & take 1 capsule (0.5 mg) by mouth at night.     traZODone (DESYREL) 50 MG tablet 1-2 tabs po qhs prn insomnia 30 tablet 1   amoxicillin (AMOXIL) 500 MG capsule Take 1,000 mg by mouth See admin instructions. Take 2 capsules (1000 mg) by mouth 1 hour before dental appointment & take 2 capsules (1000 mg) by mouth immediately following dental appointment (Patient not taking: Reported on 11/13/2021)     No facility-administered medications prior to visit.    Allergies  Allergen Reactions    Amantadine Hcl Itching   Chlorpromazine Hcl Other (See Comments)    "fell flat on face"   Grapefruit Flavor [Flavoring Agent] Other (See Comments)    Can't take with medication    ROS As per HPI  PE: Vitals with BMI 11/14/2021 10/21/2021 10/21/2021  Height 5\' 9"  - -  Weight 176 lbs 13 oz - -  BMI 02.5 - -  Systolic 852 778 -  Diastolic 65 68 -  Pulse 77 54 57  Rpt bp check manually at end of visit 105/65  Gen: Alert, well appearing.  Patient is oriented to person, place, time, and situation. AFFECT: pleasant, lucid thought and speech. CV: RRR, distant S1 and S2, no murmur/rub/gallop. Chest is clear, no wheezing or rales. Normal symmetric air entry throughout both lung fields. No chest wall deformities or tenderness. EXT: no clubbing or cyanosis.  no edema.  L arm AV fistula with palpable thrill, well healed surgical incisions.  LABS:    Chemistry      Component Value Date/Time   NA 141 10/21/2021 0611   NA 139 11/28/2020 0000   K 4.0 10/21/2021 0611   CL 107 10/21/2021 0611   CO2 26 08/07/2021 1341   BUN 22 10/21/2021 0611   BUN 23 (A) 11/28/2020 0000   CREATININE 1.70 (H) 10/21/2021 0611   CREATININE 1.63 (H) 01/21/2021 1025   GLU 154 11/28/2020 0000      Component Value Date/Time   CALCIUM 9.2 08/07/2021 1341   CALCIUM 9.5 12/06/2010 0046   ALKPHOS 53 06/04/2021 0933   AST 14 06/04/2021 0933   ALT 9 06/04/2021 0933   BILITOT 0.8 06/04/2021 0933     Lab Results  Component Value Date   WBC 5.2 08/07/2021   HGB 13.3 10/21/2021   HCT 39.0 10/21/2021   MCV 91.7 08/07/2021   PLT 214 08/07/2021   Lab Results  Component Value Date   TSH 0.64 05/20/2021   IMPRESSION AND PLAN:  #1 hypertension.  This had been well controlled, just with some recent elevated that he noted starting on a day when he was having some orthostatic dizziness.  Orthostatic dizziness has resolved but his home blood pressures have consistently been elevated.  Interestingly, today blood  pressures here are on the low side. Renal function was at baseline on the day he had  a surgery 3 weeks ago. Shared decision making process utilized with patient today and decided to do no changes at this time-> continue Coreg 6.25 mg twice a day.Marland Kitchen  He will continue home blood pressure monitoring and let me know how they are by MyChart message or phone.  He he can always make a follow-up appointment in person if needed.  Otherwise I will plan on seeing him at his already scheduled appointment for late January next year.  An After Visit Summary was printed and given to the patient.  FOLLOW UP: No follow-ups on file.  Signed:  Crissie Sickles, MD           11/14/2021

## 2021-11-17 ENCOUNTER — Other Ambulatory Visit: Payer: Self-pay | Admitting: Family Medicine

## 2021-12-03 DIAGNOSIS — N183 Chronic kidney disease, stage 3 unspecified: Secondary | ICD-10-CM | POA: Diagnosis not present

## 2021-12-03 LAB — BASIC METABOLIC PANEL
BUN: 23 — AB (ref 4–21)
CO2: 28 — AB (ref 13–22)
Chloride: 105 (ref 99–108)
Creatinine: 1.5 — AB (ref 0.6–1.3)
Glucose: 91
Potassium: 4.4 (ref 3.4–5.3)
Sodium: 139 (ref 137–147)

## 2021-12-03 LAB — HEPATIC FUNCTION PANEL
ALT: 14 (ref 10–40)
AST: 17 (ref 14–40)
Alkaline Phosphatase: 78 (ref 25–125)
Bilirubin, Total: 0.4

## 2021-12-03 LAB — CBC AND DIFFERENTIAL
HCT: 41 (ref 41–53)
Hemoglobin: 13.4 — AB (ref 13.5–17.5)
Neutrophils Absolute: 4.2
Platelets: 224 (ref 150–399)
WBC: 5.8

## 2021-12-03 LAB — COMPREHENSIVE METABOLIC PANEL
Albumin: 3.9 (ref 3.5–5.0)
Calcium: 9.1 (ref 8.7–10.7)
GFR calc non Af Amer: 47

## 2021-12-03 LAB — CBC: RBC: 4.71 (ref 3.87–5.11)

## 2021-12-10 DIAGNOSIS — R0902 Hypoxemia: Secondary | ICD-10-CM | POA: Diagnosis not present

## 2021-12-10 DIAGNOSIS — I129 Hypertensive chronic kidney disease with stage 1 through stage 4 chronic kidney disease, or unspecified chronic kidney disease: Secondary | ICD-10-CM | POA: Diagnosis not present

## 2021-12-10 DIAGNOSIS — K922 Gastrointestinal hemorrhage, unspecified: Secondary | ICD-10-CM | POA: Diagnosis not present

## 2021-12-10 DIAGNOSIS — M7989 Other specified soft tissue disorders: Secondary | ICD-10-CM | POA: Diagnosis not present

## 2021-12-10 DIAGNOSIS — E669 Obesity, unspecified: Secondary | ICD-10-CM | POA: Diagnosis not present

## 2021-12-10 DIAGNOSIS — Z940 Kidney transplant status: Secondary | ICD-10-CM | POA: Diagnosis not present

## 2021-12-10 DIAGNOSIS — M109 Gout, unspecified: Secondary | ICD-10-CM | POA: Diagnosis not present

## 2021-12-10 DIAGNOSIS — R739 Hyperglycemia, unspecified: Secondary | ICD-10-CM | POA: Diagnosis not present

## 2022-01-19 ENCOUNTER — Other Ambulatory Visit: Payer: Self-pay

## 2022-01-20 ENCOUNTER — Ambulatory Visit (INDEPENDENT_AMBULATORY_CARE_PROVIDER_SITE_OTHER): Payer: Medicare Other | Admitting: Family Medicine

## 2022-01-20 ENCOUNTER — Encounter: Payer: Self-pay | Admitting: Family Medicine

## 2022-01-20 VITALS — BP 115/74 | HR 69 | Temp 97.4°F | Ht 69.0 in | Wt 182.6 lb

## 2022-01-20 DIAGNOSIS — N1831 Chronic kidney disease, stage 3a: Secondary | ICD-10-CM | POA: Diagnosis not present

## 2022-01-20 DIAGNOSIS — E039 Hypothyroidism, unspecified: Secondary | ICD-10-CM | POA: Diagnosis not present

## 2022-01-20 DIAGNOSIS — I10 Essential (primary) hypertension: Secondary | ICD-10-CM

## 2022-01-20 DIAGNOSIS — E7800 Pure hypercholesterolemia, unspecified: Secondary | ICD-10-CM

## 2022-01-20 DIAGNOSIS — Z940 Kidney transplant status: Secondary | ICD-10-CM

## 2022-01-20 NOTE — Progress Notes (Addendum)
OFFICE VISIT  01/20/2022  CC:  Chief Complaint  Patient presents with   Follow-up    RCI    HPI:    Patient is a 86 y.o. male who presents for 4 mo f/u HTN, CRI III in the setting of hx of renal transplant, HLD, hypothyroidism. I last saw him about 2 mo ago. A/P as of that visit: "#1 hypertension.  This had been well controlled, just with some recent elevated that he noted starting on a day when he was having some orthostatic dizziness.  Orthostatic dizziness has resolved but his home blood pressures have consistently been elevated.  Interestingly, today blood pressures here are on the low side. Renal function was at baseline on the day he had a surgery 3 weeks ago. Shared decision making process utilized with patient today and decided to do no changes at this time-> continue Coreg 6.25 mg twice a day.Marland Kitchen  He will continue home blood pressure monitoring and let me know how they are by MyChart message or phone.  He he can always make a follow-up appointment in person if needed.  Otherwise I will plan on seeing him at his already scheduled appointment for late January next year."  INTERIM HX: Timmothy Sours feels well. He is happy with his left arm AV fistula ligation done in October 2022.  Home blood pressures 120s over 70s, no lows. Occasionally gets some mild orthostatic dizziness.  Nothing consistent.  Seems to be mostly when he stands up frequently at church.  Hypoth: Takes T4 on empty stomach w/out any other meds.  ROS as above, plus--> no fevers, no CP, no SOB, no wheezing, no cough,  HAs, no rashes, no melena/hematochezia.  No polyuria or polydipsia.  No myalgias or arthralgias.  No focal weakness, paresthesias, or tremors.  No acute vision or hearing abnormalities.  No dysuria or unusual/new urinary urgency or frequency.  No recent changes in lower legs. No n/v/d or abd pain.  No palpitations.     Past Medical History:  Diagnosis Date   Basal cell carcinoma    neck (skin MD 2X per year)    Bifascicular block 2021   ectopy, asymptomatic.  Cards->observe   Bipolar disorder (HCC)    BPH (benign prostatic hyperplasia)    with elevated PSA; followed by Dr. Rosana Hoes at [facility]   Chronic renal insufficiency, stage III (moderate) ([place])    in pt w/hx of renal transplant.  Post-transplant sCr 1.4-1.6.   Coronary atherosclerosis    LM and 3 V dz noted on CT 08/2019 performed for eval of interstitial lung dz in the setting of mild DOE and mild hypoxia. Cards->no stress tesing indicated->primary prevention emphasized   CVA (cerebral vascular accident) ([place]) 11/2016   Pontine (vertebrobasilar--imaging neg), TPA given.  Pt discharged on plavix.  Carotid dopplers ok, echocardiogram ok.  Residual deficit: vertigo but this improved greatly with therapy.   DOE (dyspnea on exertion) 2020   DOE and ? hypoxia->CXR 08/29/19--->diffuse interstitial opacities-? acute inflammatory process->Dr. Melvyn Novas eval'd him and was underwhelmed by the CXR and exam->CT chest 09/26/19 "Very subtle areas of mild ground-glass attenuatio-nonspecific", mild air trapping. Per Dr. Melvyn Novas, no signif ILD, improving with inc ambulation (post-inflamm pulm fibrosis?)   Esophagitis 06/14/2020   EGD->+distal esophagitis, h pylori neg, mild distal esoph stenosis widened with forceps, benign gastric polyps.   Gallbladder polyp 2015   Asymptomatic   Gallbladder sludge 2022   Hepatobiliary scan abnl, cystic duct obstructed-->cholecystectomy 07/2021.   GERD (gastroesophageal reflux disease)  Gout    History of adenomatous polyp of colon 2018   Recall 5 yrs   Hyperlipidemia    Hypertension    Hypothyroidism    Lumbar spondylosis    Ptosis due to aging    Right   Rectal bleeding 09/2017   Admitted for obs due to Hb drop.  GI consulted---obs recommended.  No transfusion required.  Plavix was d/c'd, ferrous sulfate recommended.  Outpt GI f/u--plavix restarted and upper endoscopy was normal and colonoscopy showed 2 polyps and severe  diverticular dz. Iron d/c'd 04/24/19.   Renal cyst    per pt he got MRI kidneys at the [facility] 12/2020 and the images will be viewed by Dr. Clover Mealy   Restless legs syndrome    S/p cadaver renal transplant 2013   Secondary to HTN +lithium toxicity over 30 yrs caused kidney destruction [facility] transplant MDs)   Seborrheic dermatitis    eyebrows worst: Hytone rx'd by Dr. Denna Haggard.   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   left jawline(CX35FU), Left forehead (CX35FU) right shoulder (CX35FU), R forearm    Past Surgical History:  Procedure Laterality Date   AV FISTULA PLACEMENT  04/08/2012   Procedure: ARTERIOVENOUS (AV) FISTULA CREATION;  Surgeon: Angelia Mould, MD;  Location: [place];  Service: Vascular;  Laterality: Left;  Creation of left brachial cephalic arteriovenous fistula   BIOPSY  06/14/2020   Procedure: BIOPSY;  Surgeon: Lavena Bullion, DO;  [facility];  Service: Gastroenterology;;   CATARACT EXTRACTION, BILATERAL  05/2018   CHOLECYSTECTOMY N/A 08/18/2021   Procedure: LAPAROSCOPIC CHOLECYSTECTOMY WITH CHOLANGIOGRAM;  Surgeon: Armandina Gemma, MD;  [facility];  Service: General;  Laterality: N/A;   COLONOSCOPY  2013; 11/24/17   2013; Normal except diverticulosis (recall 10 yrs).  2018 (for GI bleeding)--severe diverticular dz + 2 adenomatous polyps.  Recall 5 yrs.   DG CHEST AP OR PA ([facility] HX)  06/03/2020   Bibasilar atelectasis. No focal consoliadation definitively identified.   dilation for GERD     ESOPHAGOGASTRODUODENOSCOPY  11/24/2017   gastric polyps x 2, otherwise normal.   ESOPHAGOGASTRODUODENOSCOPY N/A 06/14/2020   +distal esophagitis, h pylori neg, mild distal esoph stenosis widened with forceps, benign gastric polypsProcedure: ESOPHAGOGASTRODUODENOSCOPY (EGD);  Surgeon: Lavena Bullion, DO;  [facility];  Service: Gastroenterology;  Laterality: N/A;  Diagnostic EGD as patietn last took Plavix 11am on 06/12/2020   KIDNEY  TRANSPLANT  11/06/12   [facility] (cadaveric)   LIGATION OF ARTERIOVENOUS  FISTULA Left 10/21/2021   Procedure: LIGATION OF LEFT ARTERIOVENOUS  FISTULA;  Surgeon: Angelia Mould, MD;  Location: [place];  Service: Vascular;  Laterality: Left;   PROSTATE BIOPSY  2011   no malignancy   TRANSTHORACIC ECHOCARDIOGRAM  11/2016   Normal LV systolic fxn, EF 32-67%.  Grade I DD.  Mild aortic root dilatation, mild MV regurg.    Outpatient Medications Prior to Visit  Medication Sig Dispense Refill   acetaminophen (TYLENOL) 325 MG tablet Take 650 mg by mouth every 6 (six) hours as needed for mild pain.     allopurinol (ZYLOPRIM) 300 MG tablet Take 1 tablet by mouth once daily 90 tablet 0   carvedilol (COREG) 12.5 MG tablet TAKE 1/2 (ONE-HALF) TABLET BY MOUTH IN THE MORNING 1 IN THE EVENING (Patient taking differently: TAKE 1/2 (ONE-HALF) TABLET BY MOUTH IN THE MORNING AND 1/2( ONE-HALF) IN THE EVENING.) 135 tablet 0   clopidogrel (PLAVIX) 75 MG tablet Take 1 tablet by mouth once  daily 90 tablet 0   hydrOXYzine (ATARAX/VISTARIL) 25 MG tablet Take 25 mg by mouth in the morning.     levothyroxine (EUTHYROX) 25 MCG tablet Take 1 tablet (25 mcg total) by mouth daily. (Patient taking differently: Take 25 mcg by mouth daily at 2 am. (0400)) 90 tablet 3   Multiple Vitamin (MULTIVITAMIN WITH MINERALS) TABS tablet Take 1 tablet by mouth at bedtime.     mycophenolate (MYFORTIC) 180 MG EC tablet Take 360 mg by mouth 2 (two) times daily.      Omega-3 Fatty Acids (FISH OIL) 1000 MG CAPS Take 2,000 mg by mouth in the morning and at bedtime.     pantoprazole (PROTONIX) 40 MG tablet Take 1 tablet by mouth twice daily (Patient taking differently: Take 40 mg by mouth daily before breakfast.) 180 tablet 3   risperiDONE (RISPERDAL) 1 MG tablet Take 1 mg by mouth at bedtime.      simvastatin (ZOCOR) 20 MG tablet Take 1 tablet (20 mg total) by mouth daily. (Patient taking differently: Take 20 mg by mouth at bedtime.) 90 tablet  3   sulfamethoxazole-trimethoprim (BACTRIM,SEPTRA) 400-80 MG tablet Take 1 tablet by mouth every Monday, Wednesday, and Friday. In the morning     tacrolimus (PROGRAF) 0.5 MG capsule Take 0.5-1 mg by mouth See admin instructions. Take 2 capsules (1 mg) by mouth in the morning & take 1 capsule (0.5 mg) by mouth at night.     amoxicillin (AMOXIL) 500 MG capsule Take 1,000 mg by mouth See admin instructions. Take 2 capsules (1000 mg) by mouth 1 hour before dental appointment & take 2 capsules (1000 mg) by mouth immediately following dental appointment (Patient not taking: Reported on 11/13/2021)     oxyCODONE (ROXICODONE) 5 MG immediate release tablet Take 1 tablet (5 mg total) by mouth every 4 (four) hours as needed. (Patient not taking: Reported on 01/20/2022) 15 tablet 0   traZODone (DESYREL) 50 MG tablet 1-2 tabs po qhs prn insomnia (Patient not taking: Reported on 01/20/2022) 30 tablet 1   No facility-administered medications prior to visit.    Allergies  Allergen Reactions   Amantadine Hcl Itching   Chlorpromazine Hcl Other (See Comments)    "fell flat on face"   Grapefruit Flavor [Flavoring Agent] Other (See Comments)    Can't take with medication    ROS As per HPI  PE: Vitals with BMI 01/20/2022 11/14/2021 10/21/2021  Height 5\' 9"  5\' 9"  -  Weight 182 lbs 10 oz 176 lbs 13 oz -  BMI 44.31 54.0 -  Systolic 086 761 950  Diastolic 74 65 68  Pulse 69 77 54     Physical Exam  General: Alert and well-appearing. Cardiovascular regular rhythm and rate, S1 and S2 distant, no murmur, no rub or gallop. Chest is clear, no wheezing or rales. Normal symmetric air entry throughout both lung fields. No chest wall deformities or tenderness. Extremities: No edema   LABS:  Last CBC Lab Results  Component Value Date   WBC 5.8 12/03/2021   HGB 13.4 (A) 12/03/2021   HCT 41 12/03/2021   MCV 91.7 08/07/2021   MCH 28.2 08/07/2021   RDW 13.7 08/07/2021   PLT 224 93/26/7124   Last metabolic  panel Lab Results  Component Value Date   GLUCOSE 99 10/21/2021   NA 139 12/03/2021   K 4.4 12/03/2021   CL 105 12/03/2021   CO2 28 (A) 12/03/2021   BUN 23 (A) 12/03/2021   CREATININE 1.5 (A) 12/03/2021  GFRNONAA 47 12/03/2021   CALCIUM 9.1 12/03/2021   PHOS 4.7 (H) 12/05/2010   PROT 6.5 06/04/2021   ALBUMIN 3.9 12/03/2021   BILITOT 0.8 06/04/2021   ALKPHOS 78 12/03/2021   AST 17 12/03/2021   ALT 14 12/03/2021   ANIONGAP 6 08/07/2021   Last lipids Lab Results  Component Value Date   CHOL 132 01/21/2021   HDL 44 01/21/2021   LDLCALC 72 01/21/2021   TRIG 84 01/21/2021   CHOLHDL 3.0 01/21/2021   Last hemoglobin A1c Lab Results  Component Value Date   HGBA1C 5.5 09/18/2021   HGBA1C 5.5 09/18/2021   HGBA1C 5.5 (A) 09/18/2021   HGBA1C 5.5 09/18/2021   Last thyroid functions Lab Results  Component Value Date   TSH 0.64 05/20/2021   IMPRESSION AND PLAN:  1) HTN: stable on 1/2 of 12.5mg  coreg tab qAM and whole tab qPM.  BMET --future.   2) HLD: lipids have been stable at goal, most recently 12/2020.   On simvastatin 20 mg. FLP and hepatic panel --future (our lab closed today).   3) Hypothyroidism:  TSH's historically stable/normal. TSH--future.  4) CRI III, hx of renal transplant, on antirejection meds BMET future.  Cont approp f/u with nephrol/transplant.  5) Prediabetes: a1c was down to 5.5% 08/2021.  Plan rpt approx 08/2022.  6) vaccines--all up-to-date.  An After Visit Summary was printed and given to the patient.  FOLLOW UP: Return in about 4 months (around 05/20/2022) for routine chronic illness f/u.  Signed:  Crissie Sickles, MD           01/20/2022

## 2022-01-23 ENCOUNTER — Other Ambulatory Visit: Payer: Self-pay

## 2022-01-23 ENCOUNTER — Other Ambulatory Visit (INDEPENDENT_AMBULATORY_CARE_PROVIDER_SITE_OTHER): Payer: Medicare Other

## 2022-01-23 DIAGNOSIS — E7800 Pure hypercholesterolemia, unspecified: Secondary | ICD-10-CM | POA: Diagnosis not present

## 2022-01-23 DIAGNOSIS — I10 Essential (primary) hypertension: Secondary | ICD-10-CM | POA: Diagnosis not present

## 2022-01-23 DIAGNOSIS — E039 Hypothyroidism, unspecified: Secondary | ICD-10-CM | POA: Diagnosis not present

## 2022-01-23 LAB — COMPREHENSIVE METABOLIC PANEL
ALT: 11 U/L (ref 0–53)
AST: 15 U/L (ref 0–37)
Albumin: 3.8 g/dL (ref 3.5–5.2)
Alkaline Phosphatase: 60 U/L (ref 39–117)
BUN: 27 mg/dL — ABNORMAL HIGH (ref 6–23)
CO2: 28 mEq/L (ref 19–32)
Calcium: 9.2 mg/dL (ref 8.4–10.5)
Chloride: 106 mEq/L (ref 96–112)
Creatinine, Ser: 1.6 mg/dL — ABNORMAL HIGH (ref 0.40–1.50)
GFR: 39.7 mL/min — ABNORMAL LOW (ref >60.00)
Glucose, Bld: 70 mg/dL (ref 70–99)
Potassium: 4.2 mEq/L (ref 3.5–5.1)
Sodium: 140 mEq/L (ref 135–145)
Total Bilirubin: 0.5 mg/dL (ref 0.2–1.2)
Total Protein: 5.9 g/dL — ABNORMAL LOW (ref 6.0–8.3)

## 2022-01-23 LAB — LIPID PANEL
Cholesterol: 131 mg/dL (ref 0–200)
HDL: 38.7 mg/dL — ABNORMAL LOW (ref >39.00)
LDL Cholesterol: 67 mg/dL (ref 0–99)
NonHDL: 92.6
Total CHOL/HDL Ratio: 3
Triglycerides: 127 mg/dL (ref 0.0–149.0)
VLDL: 25.4 mg/dL (ref 0.0–40.0)

## 2022-01-23 LAB — TSH: TSH: 0.58 u[IU]/mL (ref 0.35–5.50)

## 2022-01-23 NOTE — Progress Notes (Signed)
Per the orders of Dr. Anitra Lauth pt is here for labs pt tolerated draw well.

## 2022-01-24 ENCOUNTER — Other Ambulatory Visit: Payer: Self-pay | Admitting: Family Medicine

## 2022-02-04 ENCOUNTER — Other Ambulatory Visit: Payer: Self-pay | Admitting: Family Medicine

## 2022-02-05 DIAGNOSIS — Z79621 Long term (current) use of calcineurin inhibitor: Secondary | ICD-10-CM | POA: Diagnosis not present

## 2022-02-05 DIAGNOSIS — K222 Esophageal obstruction: Secondary | ICD-10-CM | POA: Diagnosis not present

## 2022-02-05 DIAGNOSIS — Z4822 Encounter for aftercare following kidney transplant: Secondary | ICD-10-CM | POA: Diagnosis not present

## 2022-02-05 DIAGNOSIS — E039 Hypothyroidism, unspecified: Secondary | ICD-10-CM | POA: Diagnosis not present

## 2022-02-05 DIAGNOSIS — Z79899 Other long term (current) drug therapy: Secondary | ICD-10-CM | POA: Diagnosis not present

## 2022-02-05 DIAGNOSIS — I10 Essential (primary) hypertension: Secondary | ICD-10-CM | POA: Diagnosis not present

## 2022-02-05 DIAGNOSIS — Z87891 Personal history of nicotine dependence: Secondary | ICD-10-CM | POA: Diagnosis not present

## 2022-02-05 DIAGNOSIS — E785 Hyperlipidemia, unspecified: Secondary | ICD-10-CM | POA: Diagnosis not present

## 2022-02-05 DIAGNOSIS — R7989 Other specified abnormal findings of blood chemistry: Secondary | ICD-10-CM | POA: Diagnosis not present

## 2022-02-05 DIAGNOSIS — Z7902 Long term (current) use of antithrombotics/antiplatelets: Secondary | ICD-10-CM | POA: Diagnosis not present

## 2022-02-05 DIAGNOSIS — D8989 Other specified disorders involving the immune mechanism, not elsewhere classified: Secondary | ICD-10-CM | POA: Diagnosis not present

## 2022-02-05 DIAGNOSIS — Z8673 Personal history of transient ischemic attack (TIA), and cerebral infarction without residual deficits: Secondary | ICD-10-CM | POA: Diagnosis not present

## 2022-02-05 DIAGNOSIS — Z940 Kidney transplant status: Secondary | ICD-10-CM | POA: Diagnosis not present

## 2022-02-05 DIAGNOSIS — Z85828 Personal history of other malignant neoplasm of skin: Secondary | ICD-10-CM | POA: Diagnosis not present

## 2022-02-05 DIAGNOSIS — Z792 Long term (current) use of antibiotics: Secondary | ICD-10-CM | POA: Diagnosis not present

## 2022-02-11 ENCOUNTER — Other Ambulatory Visit: Payer: Self-pay | Admitting: Family Medicine

## 2022-02-17 ENCOUNTER — Ambulatory Visit: Payer: Medicare Other | Admitting: Dermatology

## 2022-02-17 ENCOUNTER — Other Ambulatory Visit: Payer: Self-pay

## 2022-02-17 ENCOUNTER — Encounter: Payer: Self-pay | Admitting: Dermatology

## 2022-02-17 DIAGNOSIS — Z85828 Personal history of other malignant neoplasm of skin: Secondary | ICD-10-CM | POA: Diagnosis not present

## 2022-02-17 DIAGNOSIS — D0461 Carcinoma in situ of skin of right upper limb, including shoulder: Secondary | ICD-10-CM

## 2022-02-17 DIAGNOSIS — D485 Neoplasm of uncertain behavior of skin: Secondary | ICD-10-CM

## 2022-02-17 DIAGNOSIS — D1801 Hemangioma of skin and subcutaneous tissue: Secondary | ICD-10-CM | POA: Diagnosis not present

## 2022-02-17 DIAGNOSIS — Z1283 Encounter for screening for malignant neoplasm of skin: Secondary | ICD-10-CM

## 2022-02-17 DIAGNOSIS — D0462 Carcinoma in situ of skin of left upper limb, including shoulder: Secondary | ICD-10-CM

## 2022-02-17 DIAGNOSIS — L821 Other seborrheic keratosis: Secondary | ICD-10-CM | POA: Diagnosis not present

## 2022-02-17 DIAGNOSIS — L570 Actinic keratosis: Secondary | ICD-10-CM | POA: Diagnosis not present

## 2022-02-19 ENCOUNTER — Encounter: Payer: Self-pay | Admitting: Dermatology

## 2022-02-19 NOTE — Progress Notes (Signed)
° °  Follow-Up Visit   Subjective  Curtis Jimenez is a 86 y.o. male who presents for the following: Annual Exam (Lesion on left forehead x 2 years. Personal history of bcc.).  Annual skin examination, several areas of concern Location:  Duration:  Quality:  Associated Signs/Symptoms: Modifying Factors:  Severity:  Timing: Context:   Objective  Well appearing patient in no apparent distress; mood and affect are within normal limits. Scalp Step skin examination, no atypical pigmented spots.  1 probable superficial carcinoma right shoulder will be biopsied and treated.  Left Abdomen (side) - Upper, Mid Back (12), Right Abdomen (side) - Upper Multiple brown 3 to 6 mm flattopped textured papules  Right Abdomen (side) - Upper Multiple 1 mm smooth red dermal papules  Right Shoulder - Anterior Waxy 1 cm flat pink papule, superficial carcinoma       Left Frontal Scalp Gritty 6 mm pink crust       All skin waist up examined.   Assessment & Plan    Encounter for screening for malignant neoplasm of skin Scalp  Annual skin examination.  Seborrheic keratosis (14) Left Abdomen (side) - Upper; Right Abdomen (side) - Upper; Mid Back (12)  Leave if stable  Hemangioma of skin Right Abdomen (side) - Upper  No intervention necessary  Neoplasm of uncertain behavior of skin Right Shoulder - Anterior  Skin / nail biopsy Type of biopsy: tangential   Informed consent: discussed and consent obtained   Timeout: patient name, date of birth, surgical site, and procedure verified   Anesthesia: the lesion was anesthetized in a standard fashion   Anesthetic:  1% lidocaine w/ epinephrine 1-100,000 local infiltration Instrument used: flexible razor blade   Hemostasis achieved with: aluminum chloride and electrodesiccation   Outcome: patient tolerated procedure well   Post-procedure details: wound care instructions given    Destruction of lesion Complexity: simple    Destruction method: electrodesiccation and curettage   Informed consent: discussed and consent obtained   Timeout:  patient name, date of birth, surgical site, and procedure verified Anesthesia: the lesion was anesthetized in a standard fashion   Anesthetic:  1% lidocaine w/ epinephrine 1-100,000 local infiltration Curettage performed in three different directions: Yes   Electrodesiccation performed over the curetted area: Yes   Curettage cycles:  3 Lesion length (cm):  1.9 Lesion width (cm):  1.9 Margin per side (cm):  0 Final wound size (cm):  1.9 Hemostasis achieved with:  aluminum chloride Outcome: patient tolerated procedure well with no complications   Post-procedure details: wound care instructions given    Specimen 1 - Surgical pathology Differential Diagnosis: bcc vs scc txpbx  Check Margins: No  After shave biopsy the base of the lesion was curetted and cauterized  AK (actinic keratosis) Left Frontal Scalp  Destruction of lesion - Left Frontal Scalp Complexity: simple   Destruction method: cryotherapy   Informed consent: discussed and consent obtained   Timeout:  patient name, date of birth, surgical site, and procedure verified Lesion destroyed using liquid nitrogen: Yes   Cryotherapy cycles:  3 Outcome: patient tolerated procedure well with no complications   Post-procedure details: wound care instructions given        I, Lavonna Monarch, MD, have reviewed all documentation for this visit.  The documentation on 02/19/22 for the exam, diagnosis, procedures, and orders are all accurate and complete.

## 2022-03-05 DIAGNOSIS — Z940 Kidney transplant status: Secondary | ICD-10-CM | POA: Diagnosis not present

## 2022-03-31 DIAGNOSIS — Z940 Kidney transplant status: Secondary | ICD-10-CM | POA: Diagnosis not present

## 2022-03-31 DIAGNOSIS — M7989 Other specified soft tissue disorders: Secondary | ICD-10-CM | POA: Diagnosis not present

## 2022-03-31 DIAGNOSIS — M109 Gout, unspecified: Secondary | ICD-10-CM | POA: Diagnosis not present

## 2022-03-31 DIAGNOSIS — R0902 Hypoxemia: Secondary | ICD-10-CM | POA: Diagnosis not present

## 2022-03-31 DIAGNOSIS — I129 Hypertensive chronic kidney disease with stage 1 through stage 4 chronic kidney disease, or unspecified chronic kidney disease: Secondary | ICD-10-CM | POA: Diagnosis not present

## 2022-03-31 DIAGNOSIS — R739 Hyperglycemia, unspecified: Secondary | ICD-10-CM | POA: Diagnosis not present

## 2022-03-31 DIAGNOSIS — K922 Gastrointestinal hemorrhage, unspecified: Secondary | ICD-10-CM | POA: Diagnosis not present

## 2022-05-20 ENCOUNTER — Encounter: Payer: Self-pay | Admitting: Family Medicine

## 2022-05-20 ENCOUNTER — Ambulatory Visit (INDEPENDENT_AMBULATORY_CARE_PROVIDER_SITE_OTHER): Payer: Medicare Other | Admitting: Family Medicine

## 2022-05-20 VITALS — BP 123/75 | HR 63 | Temp 97.8°F | Ht 69.0 in | Wt 183.6 lb

## 2022-05-20 DIAGNOSIS — C4492 Squamous cell carcinoma of skin, unspecified: Secondary | ICD-10-CM | POA: Diagnosis not present

## 2022-05-20 DIAGNOSIS — I10 Essential (primary) hypertension: Secondary | ICD-10-CM | POA: Diagnosis not present

## 2022-05-20 DIAGNOSIS — L989 Disorder of the skin and subcutaneous tissue, unspecified: Secondary | ICD-10-CM | POA: Diagnosis not present

## 2022-05-20 DIAGNOSIS — K219 Gastro-esophageal reflux disease without esophagitis: Secondary | ICD-10-CM

## 2022-05-20 DIAGNOSIS — N1832 Chronic kidney disease, stage 3b: Secondary | ICD-10-CM

## 2022-05-20 LAB — BASIC METABOLIC PANEL
BUN: 25 mg/dL — ABNORMAL HIGH (ref 6–23)
CO2: 28 mEq/L (ref 19–32)
Calcium: 9.2 mg/dL (ref 8.4–10.5)
Chloride: 107 mEq/L (ref 96–112)
Creatinine, Ser: 1.53 mg/dL — ABNORMAL HIGH (ref 0.40–1.50)
GFR: 41.8 mL/min — ABNORMAL LOW (ref >60.00)
Glucose, Bld: 99 mg/dL (ref 70–99)
Potassium: 4.4 mEq/L (ref 3.5–5.1)
Sodium: 142 mEq/L (ref 135–145)

## 2022-05-20 MED ORDER — ALLOPURINOL 300 MG PO TABS: 300.0000 mg | ORAL_TABLET | Freq: Every day | ORAL | 1 refills | Status: DC

## 2022-05-20 MED ORDER — CARVEDILOL 12.5 MG PO TABS: ORAL_TABLET | ORAL | 1 refills | Status: DC

## 2022-05-20 MED ORDER — CLOPIDOGREL BISULFATE 75 MG PO TABS: 75.0000 mg | ORAL_TABLET | Freq: Every day | ORAL | 1 refills | Status: DC

## 2022-05-20 NOTE — Progress Notes (Signed)
OFFICE VISIT  05/20/2022  CC:  Chief Complaint  Patient presents with   Hypertension   Chronic Kidney Disease    Pt is fasting   Patient is a 86 y.o. male who presents for 4 mo f/u HTN, CRI III in the setting of hx of renal transplant, hypothyroidism. A/P as of last visit: "1) HTN: stable on 1/2 of 12.'5mg'$  coreg tab qAM and whole tab qPM.  BMET --future.    2) HLD: lipids have been stable at goal, most recently 12/2020.   On simvastatin 20 mg. FLP and hepatic panel --future (our lab closed today).   3) Hypothyroidism:  TSH's historically stable/normal. TSH--future.   4) CRI III, hx of renal transplant, on antirejection meds BMET future.  Cont approp f/u with nephrol/transplant.   5) Prediabetes: a1c was down to 5.5% 08/2021.  Plan rpt approx 08/2022.   6) vaccines--all up-to-date."  INTERIM HX: Don feels well. He is been doing a lot of travel in the [place] lately, saw a bunch of old friends and family.  He is originally from the [place] area. He plans on going to [place] to visit his son pretty soon.  He has no acute complaints but requests referral to a new dermatologist. Currently sees Dr. 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 He just wants a second opinion since the lesion is coming back after excision.  He takes one half of a 12.5 mg carvedilol tab twice a day.  Home blood pressures normal.  No dizziness.  Labs done via transplant clinic 02/05/2022, reviewed today.  Serum creatinine was 1.84.  The remainder of his CMET was normal.  CBC and iron profile normal.  Lipid panel normal.  Magnesium and phosphorus normal.  Hemoglobin A1c 5.6%. This serum creatinine is a bit up from his baseline of 1.6.  ROS as above, plus--> no fevers, no CP, no SOB, no wheezing, no cough, no HAs, no rashes, no melena/hematochezia.  No polyuria or polydipsia.  No myalgias or arthralgias.  No focal weakness, paresthesias, or tremors.  No acute vision or  hearing abnormalities.  No dysuria or unusual/new urinary urgency or frequency.  No recent changes in lower legs. No n/v/d or abd pain.  No palpitations.    Past Medical History:  Diagnosis Date   Basal cell carcinoma    neck (skin MD 2X per year)   Bifascicular block 2021   ectopy, asymptomatic.  Cards->observe   Bipolar disorder (HCC)    BPH (benign prostatic hyperplasia)    with elevated PSA; followed by Dr. Rosana Hoes at [facility]   Chronic renal insufficiency, stage III (moderate) ([place])    in pt w/hx of renal transplant.  Post-transplant sCr 1.4-1.6.   Coronary atherosclerosis    LM and 3 V dz noted on CT 08/2019 performed for eval of interstitial lung dz in the setting of mild DOE and mild hypoxia. Cards->no stress tesing indicated->primary prevention emphasized   CVA (cerebral vascular accident) ([place]) 11/2016   Pontine (vertebrobasilar--imaging neg), TPA given.  Pt discharged on plavix.  Carotid dopplers ok, echocardiogram ok.  Residual deficit: vertigo but this improved greatly with therapy.   DOE (dyspnea on exertion) 2020   DOE and ? hypoxia->CXR 08/29/19--->diffuse interstitial opacities-? acute inflammatory process->Dr. Melvyn Novas eval'd him and was underwhelmed by the CXR and exam->CT chest 09/26/19 "Very subtle areas of mild ground-glass attenuatio-nonspecific", mild air trapping. Per Dr. Melvyn Novas, no signif ILD, improving with inc ambulation (post-inflamm pulm fibrosis?)  Esophagitis 06/14/2020   EGD->+distal esophagitis, h pylori neg, mild distal esoph stenosis widened with forceps, benign gastric polyps.   Gallbladder polyp 2015   Asymptomatic   Gallbladder sludge 2022   Hepatobiliary scan abnl, cystic duct obstructed-->cholecystectomy 07/2021.   GERD (gastroesophageal reflux disease)    Gout    History of adenomatous polyp of colon 2018   Recall 5 yrs   Hyperlipidemia    Hypertension    Hypothyroidism    Lumbar spondylosis    Ptosis due to aging    Right   Rectal bleeding  09/2017   Admitted for obs due to Hb drop.  GI consulted---obs recommended.  No transfusion required.  Plavix was d/c'd, ferrous sulfate recommended.  Outpt GI f/u--plavix restarted and upper endoscopy was normal and colonoscopy showed 2 polyps and severe diverticular dz. Iron d/c'd 04/24/19.   Renal cyst    per pt he got MRI kidneys at the [facility] 12/2020 and the images will be viewed by Dr. Clover Mealy   Restless legs syndrome    S/p cadaver renal transplant 2013   Secondary to HTN +lithium toxicity over 30 yrs caused kidney destruction [facility] transplant MDs)   Seborrheic dermatitis    eyebrows worst: Hytone rx'd by Dr. Denna Haggard.   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   left jawline(CX35FU), Left forehead (CX35FU) right shoulder (CX35FU), R forearm    Past Surgical History:  Procedure Laterality Date   AV FISTULA PLACEMENT  04/08/2012   Procedure: ARTERIOVENOUS (AV) FISTULA CREATION;  Surgeon: Angelia Mould, MD;  Location: [place];  Service: Vascular;  Laterality: Left;  Creation of left brachial cephalic arteriovenous fistula   BIOPSY  06/14/2020   Procedure: BIOPSY;  Surgeon: Lavena Bullion, DO;  Location: [facility];  Service: Gastroenterology;;   CATARACT EXTRACTION, BILATERAL  05/2018   CHOLECYSTECTOMY N/A 08/18/2021   Procedure: LAPAROSCOPIC CHOLECYSTECTOMY WITH CHOLANGIOGRAM;  Surgeon: Armandina Gemma, MD;  Location: [facility];  Service: General;  Laterality: N/A;   COLONOSCOPY  2013; 11/24/17   2013; Normal except diverticulosis (recall 10 yrs).  2018 (for GI bleeding)--severe diverticular dz + 2 adenomatous polyps.  Recall 5 yrs.   DG CHEST AP OR PA ([facility] HX)  06/03/2020   Bibasilar atelectasis. No focal consoliadation definitively identified.   dilation for GERD     ESOPHAGOGASTRODUODENOSCOPY  11/24/2017   gastric polyps x 2, otherwise normal.   ESOPHAGOGASTRODUODENOSCOPY N/A 06/14/2020   +distal esophagitis, h pylori neg, mild distal esoph stenosis widened with  forceps, benign gastric polypsProcedure: ESOPHAGOGASTRODUODENOSCOPY (EGD);  Surgeon: Lavena Bullion, DO;  Location: [facility];  Service: Gastroenterology;  Laterality: N/A;  Diagnostic EGD as patietn last took Plavix 11am on 06/12/2020   KIDNEY TRANSPLANT  11/06/12   [facility]   LIGATION OF ARTERIOVENOUS  FISTULA Left 10/21/2021   Procedure: LIGATION OF LEFT ARTERIOVENOUS  FISTULA;  Surgeon: Angelia Mould, MD;  Location: [place];  Service: Vascular;  Laterality: Left;   PROSTATE BIOPSY  2011   no malignancy   TRANSTHORACIC ECHOCARDIOGRAM  11/2016   Normal LV systolic fxn, EF 35-46%.  Grade I DD.  Mild aortic root dilatation, mild MV regurg.    Outpatient Medications Prior to Visit  Medication Sig Dispense Refill   acetaminophen (TYLENOL) 325 MG tablet Take 650 mg by mouth every 6 (six) hours as needed for mild pain.     hydrOXYzine (ATARAX/VISTARIL) 25 MG tablet Take 50 mg by mouth at bedtime.     levothyroxine (EUTHYROX) 25  MCG tablet Take 1 tablet (25 mcg total) by mouth daily. (Patient taking differently: Take 25 mcg by mouth daily at 2 am. (0400)) 90 tablet 3   Multiple Vitamin (MULTIVITAMIN WITH MINERALS) TABS tablet Take 1 tablet by mouth at bedtime.     mycophenolate (MYFORTIC) 180 MG EC tablet Take 360 mg by mouth 2 (two) times daily.      Omega-3 Fatty Acids (FISH OIL) 1000 MG CAPS Take 2,000 mg by mouth in the morning and at bedtime.     pantoprazole (PROTONIX) 40 MG tablet Take 1 tablet by mouth twice daily (Patient taking differently: Take 40 mg by mouth daily.) 180 tablet 3   risperiDONE (RISPERDAL) 1 MG tablet Take 1 mg by mouth at bedtime.      simvastatin (ZOCOR) 20 MG tablet Take 1 tablet (20 mg total) by mouth daily. (Patient taking differently: Take 20 mg by mouth at bedtime.) 90 tablet 3   sulfamethoxazole-trimethoprim (BACTRIM,SEPTRA) 400-80 MG tablet Take 1 tablet by mouth every Monday, Wednesday, and Friday. In the morning     tacrolimus (PROGRAF)  0.5 MG capsule Take 0.5-1 mg by mouth See admin instructions. Take 2 capsules (1 mg) by mouth in the morning & take 1 capsule (0.5 mg) by mouth at night.     amoxicillin (AMOXIL) 500 MG capsule Take 1,000 mg by mouth See admin instructions. Take 2 capsules (1000 mg) by mouth 1 hour before dental appointment & take 2 capsules (1000 mg) by mouth immediately following dental appointment (Patient not taking: Reported on 11/13/2021)     allopurinol (ZYLOPRIM) 300 MG tablet Take 1 tablet by mouth once daily 90 tablet 0   carvedilol (COREG) 12.5 MG tablet TAKE 1/2 (ONE-HALF) TABLET BY MOUTH IN THE MORNING AND 1 TABLET IN THE EVENING (Patient taking differently: TAKE 1/2 (ONE-HALF) TABLET BY MOUTH IN THE MORNING AND 1/2 TABLET IN THE EVENING) 135 tablet 0   clopidogrel (PLAVIX) 75 MG tablet Take 1 tablet by mouth once daily 90 tablet 0   No facility-administered medications prior to visit.    Allergies  Allergen Reactions   Amantadine Hcl Itching   Chlorpromazine Hcl Other (See Comments)    "fell flat on face"   Grapefruit Flavor [Flavoring Agent] Other (See Comments)    Can't take with medication   ROS As per HPI  PE:    05/20/2022    9:58 AM 01/20/2022   10:02 AM 11/14/2021   10:29 AM  Vitals with BMI  Height '5\' 9"'$  '5\' 9"'$  '5\' 9"'$   Weight 183 lbs 10 oz 182 lbs 10 oz 176 lbs 13 oz  BMI 27.1 00.86 76.1  Systolic 950 932 671  Diastolic 75 74 65  Pulse 63 69 77     Physical Exam  Gen: Alert, well appearing.  Patient is oriented to person, place, time, and situation. AFFECT: pleasant, lucid thought and speech. CV: RRR, no m/r/g.   LUNGS: CTA bilat, nonlabored resps, good aeration in all lung fields. EXT: no clubbing or cyanosis.  no edema.    LABS:  Last CBC Lab Results  Component Value Date   WBC 5.8 12/03/2021   HGB 13.4 (A) 12/03/2021   HCT 41 12/03/2021   MCV 91.7 08/07/2021   MCH 28.2 08/07/2021   RDW 13.7 08/07/2021   PLT 224 24/58/0998   Last metabolic panel Lab  Results  Component Value Date   GLUCOSE 70 01/23/2022   NA 140 01/23/2022   K 4.2 01/23/2022   CL 106 01/23/2022  CO2 28 01/23/2022   BUN 27 (H) 01/23/2022   CREATININE 1.60 (H) 01/23/2022   GFRNONAA 47 12/03/2021   CALCIUM 9.2 01/23/2022   PHOS 4.7 (H) 12/05/2010   PROT 5.9 (L) 01/23/2022   ALBUMIN 3.8 01/23/2022   BILITOT 0.5 01/23/2022   ALKPHOS 60 01/23/2022   AST 15 01/23/2022   ALT 11 01/23/2022   ANIONGAP 6 08/07/2021   Last lipids Lab Results  Component Value Date   CHOL 131 01/23/2022   HDL 38.70 (L) 01/23/2022   LDLCALC 67 01/23/2022   TRIG 127.0 01/23/2022   CHOLHDL 3 01/23/2022   Last hemoglobin A1c Lab Results  Component Value Date   HGBA1C 5.5 09/18/2021   HGBA1C 5.5 09/18/2021   HGBA1C 5.5 (A) 09/18/2021   HGBA1C 5.5 09/18/2021   Last thyroid functions Lab Results  Component Value Date   TSH 0.58 01/23/2022   IMPRESSION AND PLAN:  #1 hypertension, well controlled on 6.25 mg carvedilol twice a day. Electrolytes and creatinine today.  2.  Chronic renal insufficiency, history of renal transplant. He is status post annual follow-up with his transplant clinic, all was stable. He says he is no longer getting a follow-up there because of the high out-of-pocket cost. He has a nephrologist separate from them that he will continue to go to. Basic metabolic panel today.  3.  Hypothyroidism.  TSH has been stable long-term on 25 mcg levothyroxine daily. Plan repeat TSH at next follow-up in 4 months.  4.  GERD.  Significant improvement in symptoms since using 40 mg Protonix daily. Continue this.  Discussed risks and benefits of long-term treatment with this medication.  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 Patient requests referral for second opinion--referred to skin surgery center today.  An After Visit Summary was printed and given to the patient.  FOLLOW UP: Return in about 4 months (around 09/20/2022) for annual CPE  (fasting).  Signed:  Crissie Sickles, MD           05/20/2022

## 2022-05-28 ENCOUNTER — Telehealth: Payer: Self-pay

## 2022-05-28 NOTE — Telephone Encounter (Signed)
Called pt to schedule AWV. Please schedule with health coach, Toria or Shantrice Rodenberg. ? ?

## 2022-06-03 ENCOUNTER — Ambulatory Visit (INDEPENDENT_AMBULATORY_CARE_PROVIDER_SITE_OTHER): Payer: Medicare Other

## 2022-06-03 DIAGNOSIS — Z0000 Encounter for general adult medical examination without abnormal findings: Secondary | ICD-10-CM

## 2022-06-03 NOTE — Patient Instructions (Signed)
Curtis Jimenez , Thank you for taking time to come for your Medicare Wellness Visit. I appreciate your ongoing commitment to your health goals. Please review the following plan we discussed and let me know if I can assist you in the future.   Screening recommendations/referrals: Colonoscopy: Completed 11/24/17 pt prefers testing as needed  Recommended yearly ophthalmology/optometry visit for glaucoma screening and checkup Recommended yearly dental visit for hygiene and checkup  Vaccinations: Influenza vaccine: Done 09/30/21 repeat every year  Pneumococcal vaccine: Up to date Tdap vaccine: Done 04/27/14 repeat every 10 years  Shingles vaccine: Done 5/28, 09/15/21   Covid-19: Completed 1/11, 2/4, 08/17/20, 4/5, 12/10/21  Advanced directives: copies in chart   Conditions/risks identified: stay healthy   Next appointment: Follow up in one year for your annual wellness visit.   Preventive Care 84 Years and Older, Male Preventive care refers to lifestyle choices and visits with your health care provider that can promote health and wellness. What does preventive care include? A yearly physical exam. This is also called an annual well check. Dental exams once or twice a year. Routine eye exams. Ask your health care provider how often you should have your eyes checked. Personal lifestyle choices, including: Daily care of your teeth and gums. Regular physical activity. Eating a healthy diet. Avoiding tobacco and drug use. Limiting alcohol use. Practicing safe sex. Taking low doses of aspirin every day. Taking vitamin and mineral supplements as recommended by your health care provider. What happens during an annual well check? The services and screenings done by your health care provider during your annual well check will depend on your age, overall health, lifestyle risk factors, and family history of disease. Counseling  Your health care provider may ask you questions about your: Alcohol  use. Tobacco use. Drug use. Emotional well-being. Home and relationship well-being. Sexual activity. Eating habits. History of falls. Memory and ability to understand (cognition). Work and work Statistician. Screening  You may have the following tests or measurements: Height, weight, and BMI. Blood pressure. Lipid and cholesterol levels. These may be checked every 5 years, or more frequently if you are over 28 years old. Skin check. Lung cancer screening. You may have this screening every year starting at age 47 if you have a 30-pack-year history of smoking and currently smoke or have quit within the past 15 years. Fecal occult blood test (FOBT) of the stool. You may have this test every year starting at age 19. Flexible sigmoidoscopy or colonoscopy. You may have a sigmoidoscopy every 5 years or a colonoscopy every 10 years starting at age 79. Prostate cancer screening. Recommendations will vary depending on your family history and other risks. Hepatitis C blood test. Hepatitis B blood test. Sexually transmitted disease (STD) testing. Diabetes screening. This is done by checking your blood sugar (glucose) after you have not eaten for a while (fasting). You may have this done every 1-3 years. Abdominal aortic aneurysm (AAA) screening. You may need this if you are a current or former smoker. Osteoporosis. You may be screened starting at age 11 if you are at high risk. Talk with your health care provider about your test results, treatment options, and if necessary, the need for more tests. Vaccines  Your health care provider may recommend certain vaccines, such as: Influenza vaccine. This is recommended every year. Tetanus, diphtheria, and acellular pertussis (Tdap, Td) vaccine. You may need a Td booster every 10 years. Zoster vaccine. You may need this after age 45. Pneumococcal 13-valent conjugate (  PCV13) vaccine. One dose is recommended after age 71. Pneumococcal polysaccharide  (PPSV23) vaccine. One dose is recommended after age 54. Talk to your health care provider about which screenings and vaccines you need and how often you need them. This information is not intended to replace advice given to you by your health care provider. Make sure you discuss any questions you have with your health care provider. Document Released: 01/10/2016 Document Revised: 09/02/2016 Document Reviewed: 10/15/2015 Elsevier Interactive Patient Education  2017 [place] Prevention in the Home Falls can cause injuries. They can happen to people of all ages. There are many things you can do to make your home safe and to help prevent falls. What can I do on the outside of my home? Regularly fix the edges of walkways and driveways and fix any cracks. Remove anything that might make you trip as you walk through a door, such as a raised step or threshold. Trim any bushes or trees on the path to your home. Use bright outdoor lighting. Clear any walking paths of anything that might make someone trip, such as rocks or tools. Regularly check to see if handrails are loose or broken. Make sure that both sides of any steps have handrails. Any raised decks and porches should have guardrails on the edges. Have any leaves, snow, or ice cleared regularly. Use sand or salt on walking paths during winter. Clean up any spills in your garage right away. This includes oil or grease spills. What can I do in the bathroom? Use night lights. Install grab bars by the toilet and in the tub and shower. Do not use towel bars as grab bars. Use non-skid mats or decals in the tub or shower. If you need to sit down in the shower, use a plastic, non-slip stool. Keep the floor dry. Clean up any water that spills on the floor as soon as it happens. Remove soap buildup in the tub or shower regularly. Attach bath mats securely with double-sided non-slip rug tape. Do not have throw rugs and other things on the  floor that can make you trip. What can I do in the bedroom? Use night lights. Make sure that you have a light by your bed that is easy to reach. Do not use any sheets or blankets that are too big for your bed. They should not hang down onto the floor. Have a firm chair that has side arms. You can use this for support while you get dressed. Do not have throw rugs and other things on the floor that can make you trip. What can I do in the kitchen? Clean up any spills right away. Avoid walking on wet floors. Keep items that you use a lot in easy-to-reach places. If you need to reach something above you, use a strong step stool that has a grab bar. Keep electrical cords out of the way. Do not use floor polish or wax that makes floors slippery. If you must use wax, use non-skid floor wax. Do not have throw rugs and other things on the floor that can make you trip. What can I do with my stairs? Do not leave any items on the stairs. Make sure that there are handrails on both sides of the stairs and use them. Fix handrails that are broken or loose. Make sure that handrails are as long as the stairways. Check any carpeting to make sure that it is firmly attached to the stairs. Fix any carpet that is  loose or worn. Avoid having throw rugs at the top or bottom of the stairs. If you do have throw rugs, attach them to the floor with carpet tape. Make sure that you have a light switch at the top of the stairs and the bottom of the stairs. If you do not have them, ask someone to add them for you. What else can I do to help prevent falls? Wear shoes that: Do not have high heels. Have rubber bottoms. Are comfortable and fit you well. Are closed at the toe. Do not wear sandals. If you use a stepladder: Make sure that it is fully opened. Do not climb a closed stepladder. Make sure that both sides of the stepladder are locked into place. Ask someone to hold it for you, if possible. Clearly Sim and make  sure that you can see: Any grab bars or handrails. First and last steps. Where the edge of each step is. Use tools that help you move around (mobility aids) if they are needed. These include: Canes. Walkers. Scooters. Crutches. Turn on the lights when you go into a dark area. Replace any light bulbs as soon as they burn out. Set up your furniture so you have a clear path. Avoid moving your furniture around. If any of your floors are uneven, fix them. If there are any pets around you, be aware of where they are. Review your medicines with your doctor. Some medicines can make you feel dizzy. This can increase your chance of falling. Ask your doctor what other things that you can do to help prevent falls. This information is not intended to replace advice given to you by your health care provider. Make sure you discuss any questions you have with your health care provider. Document Released: 10/10/2009 Document Revised: 05/21/2016 Document Reviewed: 01/18/2015 Elsevier Interactive Patient Education  2017 [employer].

## 2022-06-03 NOTE — Progress Notes (Signed)
Virtual Visit via Telephone Note  I connected with  Curtis Jimenez on 06/03/22 at  2:45 PM EDT by telephone and verified that I am speaking with the correct person using two identifiers.  Medicare Annual Wellness visit completed telephonically due to Covid-19 pandemic.   Persons participating in this call: This Health Coach and this patient.   Location: Patient: Home Provider: office    I discussed the limitations, risks, security and privacy concerns of performing an evaluation and management service by telephone and the availability of in person appointments. The patient expressed understanding and agreed to proceed.  Unable to perform video visit due to video visit attempted and failed and/or patient does not have video capability.   Some vital signs may be absent or patient reported.   Willette Brace, LPN   Subjective:   Curtis Jimenez is a 86 y.o. male who presents for Medicare Annual/Subsequent preventive examination.  Review of Systems     Cardiac Risk Factors include: advanced age (>8mn, >>78women);hypertension;dyslipidemia;male gender     Objective:    There were no vitals filed for this visit. There is no height or weight on file to calculate BMI.     06/03/2022    2:50 PM 10/21/2021    6:10 AM 08/18/2021   10:55 AM 08/07/2021    1:20 PM 05/28/2021    2:55 PM 07/29/2020   11:43 AM 06/25/2020    6:26 PM  Advanced Directives  Does Patient Have a Medical Advance Directive? Yes Yes Yes Yes Yes No Yes  Type of AParamedicof [place] will [place]Living will [place]Living will [place] FurnaceLiving will  HEl[place]Living will  Does patient want to make changes to medical advance directive?  No - Patient declined No - Patient declined    No - Guardian declined  Copy of [place]in Chart? Yes - validated most  recent copy scanned in chart (See row information)    Yes - validated most recent copy scanned in chart (See row information)  Yes - validated most recent copy scanned in chart (See row information)  Would patient like information on creating a medical advance directive?      No - Patient declined No - Patient declined    Current Medications (verified) Outpatient Encounter Medications as of 06/03/2022  Medication Sig   acetaminophen (TYLENOL) 325 MG tablet Take 650 mg by mouth every 6 (six) hours as needed for mild pain.   allopurinol (ZYLOPRIM) 300 MG tablet Take 1 tablet (300 mg total) by mouth daily.   carvedilol (COREG) 12.5 MG tablet TAKE 1/2 (ONE-HALF) TABLET BY MOUTH IN THE MORNING AND 1/2 TABLET IN THE EVENING   clopidogrel (PLAVIX) 75 MG tablet Take 1 tablet (75 mg total) by mouth daily.   hydrOXYzine (ATARAX/VISTARIL) 25 MG tablet Take 50 mg by mouth at bedtime.   levothyroxine (EUTHYROX) 25 MCG tablet Take 1 tablet (25 mcg total) by mouth daily. (Patient taking differently: Take 25 mcg by mouth daily at 2 am. (0400))   Multiple Vitamin (MULTIVITAMIN WITH MINERALS) TABS tablet Take 1 tablet by mouth at bedtime.   mycophenolate (MYFORTIC) 180 MG EC tablet Take 360 mg by mouth 2 (two) times daily.    Omega-3 Fatty Acids (FISH OIL) 1000 MG CAPS Take 2,000 mg by mouth in the morning and at bedtime.   pantoprazole (PROTONIX) 40 MG tablet Take 1 tablet  by mouth twice daily (Patient taking differently: Take 40 mg by mouth daily.)   risperiDONE (RISPERDAL) 1 MG tablet Take 1 mg by mouth at bedtime.    simvastatin (ZOCOR) 20 MG tablet Take 1 tablet (20 mg total) by mouth daily. (Patient taking differently: Take 20 mg by mouth at bedtime.)   sulfamethoxazole-trimethoprim (BACTRIM,SEPTRA) 400-80 MG tablet Take 1 tablet by mouth every Monday, Wednesday, and Friday. In the morning   tacrolimus (PROGRAF) 0.5 MG capsule Take 0.5-1 mg by mouth See admin instructions. Take 2 capsules (1 mg) by mouth in  the morning & take 1 capsule (0.5 mg) by mouth at night.   amoxicillin (AMOXIL) 500 MG capsule Take 1,000 mg by mouth See admin instructions. Take 2 capsules (1000 mg) by mouth 1 hour before dental appointment & take 2 capsules (1000 mg) by mouth immediately following dental appointment (Patient not taking: Reported on 11/13/2021)   No facility-administered encounter medications on file as of 06/03/2022.    Allergies (verified) Amantadine hcl, Chlorpromazine hcl, and Grapefruit flavor [flavoring agent]   History: Past Medical History:  Diagnosis Date   Basal cell carcinoma    neck (skin MD 2X per year)   Bifascicular block 2021   ectopy, asymptomatic.  Cards->observe   Bipolar disorder (HCC)    BPH (benign prostatic hyperplasia)    with elevated PSA; followed by Dr. Rosana Hoes at [facility]   Chronic renal insufficiency, stage III (moderate) ([place])    in pt w/hx of renal transplant.  Post-transplant sCr 1.4-1.6.   Coronary atherosclerosis    LM and 3 V dz noted on CT 08/2019 performed for eval of interstitial lung dz in the setting of mild DOE and mild hypoxia. Cards->no stress tesing indicated->primary prevention emphasized   CVA (cerebral vascular accident) ([place]) 11/2016   Pontine (vertebrobasilar--imaging neg), TPA given.  Pt discharged on plavix.  Carotid dopplers ok, echocardiogram ok.  Residual deficit: vertigo but this improved greatly with therapy.   DOE (dyspnea on exertion) 2020   DOE and ? hypoxia->CXR 08/29/19--->diffuse interstitial opacities-? acute inflammatory process->Dr. Melvyn Novas eval'd him and was underwhelmed by the CXR and exam->CT chest 09/26/19 "Very subtle areas of mild ground-glass attenuatio-nonspecific", mild air trapping. Per Dr. Melvyn Novas, no signif ILD, improving with inc ambulation (post-inflamm pulm fibrosis?)   Esophagitis 06/14/2020   EGD->+distal esophagitis, h pylori neg, mild distal esoph stenosis widened with forceps, benign gastric polyps.   Gallbladder polyp 2015    Asymptomatic   Gallbladder sludge 2022   Hepatobiliary scan abnl, cystic duct obstructed-->cholecystectomy 07/2021.   GERD (gastroesophageal reflux disease)    Gout    History of adenomatous polyp of colon 2018   Recall 5 yrs   Hyperlipidemia    Hypertension    Hypothyroidism    Lumbar spondylosis    Ptosis due to aging    Right   Rectal bleeding 09/2017   Admitted for obs due to Hb drop.  GI consulted---obs recommended.  No transfusion required.  Plavix was d/c'd, ferrous sulfate recommended.  Outpt GI f/u--plavix restarted and upper endoscopy was normal and colonoscopy showed 2 polyps and severe diverticular dz. Iron d/c'd 04/24/19.   Renal cyst    per pt he got MRI [facility] 12/2020 and the images will be viewed by Dr. Clover Mealy   Restless legs syndrome    S/p cadaver renal transplant 2013   Secondary to HTN +lithium toxicity over 30 yrs caused kidney destruction [facility] transplant MDs)   Seborrheic dermatitis  eyebrows worst: Hytone rx'd by Dr. Denna Haggard.   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   left jawline(CX35FU), Left forehead (CX35FU) right shoulder (CX35FU), R forearm   Past Surgical History:  Procedure Laterality Date   AV FISTULA PLACEMENT  04/08/2012   Procedure: ARTERIOVENOUS (AV) FISTULA CREATION;  Surgeon: Angelia Mould, MD;  Location: [place];  Service: Vascular;  Laterality: Left;  Creation of left brachial cephalic arteriovenous fistula   BIOPSY  06/14/2020   Procedure: BIOPSY;  Surgeon: Lavena Bullion, DO;  Location: [facility];  Service: Gastroenterology;;   CATARACT EXTRACTION, BILATERAL  05/2018   CHOLECYSTECTOMY N/A 08/18/2021   Procedure: LAPAROSCOPIC CHOLECYSTECTOMY WITH CHOLANGIOGRAM;  Surgeon: Armandina Gemma, MD;  Location: [facility];  Service: General;  Laterality: N/A;   COLONOSCOPY  2013; 11/24/17   2013; Normal except diverticulosis (recall 10 yrs).  2018 (for GI bleeding)--severe diverticular dz + 2 adenomatous  polyps.  Recall 5 yrs.   DG CHEST AP OR PA ([facility] HX)  06/03/2020   Bibasilar atelectasis. No focal consoliadation definitively identified.   dilation for GERD     ESOPHAGOGASTRODUODENOSCOPY  11/24/2017   gastric polyps x 2, otherwise normal.   ESOPHAGOGASTRODUODENOSCOPY N/A 06/14/2020   +distal esophagitis, h pylori neg, mild distal esoph stenosis widened with forceps, benign gastric polypsProcedure: ESOPHAGOGASTRODUODENOSCOPY (EGD);  Surgeon: Lavena Bullion, DO;  Location: [facility];  Service: Gastroenterology;  Laterality: N/A;  Diagnostic EGD as patietn last took Plavix 11am on 06/12/2020   KIDNEY TRANSPLANT  11/06/12   [facility] (cadaveric)   LIGATION OF ARTERIOVENOUS  FISTULA Left 10/21/2021   Procedure: LIGATION OF LEFT ARTERIOVENOUS  FISTULA;  Surgeon: Angelia Mould, MD;  Location: [place];  Service: Vascular;  Laterality: Left;   PROSTATE BIOPSY  2011   no malignancy   TRANSTHORACIC ECHOCARDIOGRAM  11/2016   Normal LV systolic fxn, EF 29-47%.  Grade I DD.  Mild aortic root dilatation, mild MV regurg.   Family History  Problem Relation Age of Onset   Ulcers Mother        GI bleed    Heart disease Father    Bladder Cancer Father    Colon cancer Neg Hx    Esophageal cancer Neg Hx    Pancreatic cancer Neg Hx    Rectal cancer Neg Hx    Stomach cancer Neg Hx    Social History   Socioeconomic History   Marital status: Married    Spouse name: Not on file   Number of children: Not on file   Years of education: Not on file   Highest education level: Not on file  Occupational History   Not on file  Tobacco Use   Smoking status: Former    Types: Pipe, Cigars    Quit date: 05/11/1974    Years since quitting: 48.0   Smokeless tobacco: Never  Vaping Use   Vaping Use: Never used  Substance and Sexual Activity   Alcohol use: No   Drug use: No   Sexual activity: Not Currently    Partners: Female  Other Topics Concern   Not on file  Social History Narrative    Married, 4 children, 9 GGC, [place].   Occupation: retired Marine scientist.  Originally from [place].   Tobacco: quit 1975; smoked pipes and cigars x 15 yrs prior to this.   Alcohol: none.   Exercise: minimal, but is going to sign up for silver sneakers at the [facility].   Social Determinants of Health   Financial  Resource Strain: Low Risk    Difficulty of Paying Living Expenses: Not hard at all  Food Insecurity: No Food Insecurity   Worried About Charity fundraiser in the Last Year: Never true   Ran Out of Food in the Last Year: Never true  Transportation Needs: No Transportation Needs   Lack of Transportation (Medical): No   Lack of Transportation (Non-Medical): No  Physical Activity: Inactive   Days of Exercise per Week: 0 days   Minutes of Exercise per Session: 0 min  Stress: No Stress Concern Present   Feeling of Stress : Not at all  Social Connections: Socially Integrated   Frequency of Communication with Friends and Family: Twice a week   Frequency of Social Gatherings with Friends and Family: More than three times a week   Attends Religious Services: 1 to 4 times per year   Active Member of [employer] or Organizations: Yes   Attends Archivist Meetings: 1 to 4 times per year   Marital Status: Married    Tobacco Counseling Counseling given: Not Answered   Clinical Intake:  Pre-visit preparation completed: Yes  Pain : No/denies pain     BMI - recorded: 27.11 Nutritional Status: BMI 25 -29 Overweight Nutritional Risks: None Diabetes: No  How often do you need to have someone help you when you read instructions, pamphlets, or other written materials from your doctor or pharmacy?: 1 - Never  Diabetic?no  Interpreter Needed?: No  Information entered by :: Charlott Rakes, LPN   Activities of Daily Living    06/03/2022    2:51 PM 10/21/2021    6:21 AM  In your present state of health, do you have any difficulty performing the following activities:  Hearing? 1    Comment HOH with softer voices   Vision? 0   Difficulty concentrating or making decisions? 0   Walking or climbing stairs? 0   Dressing or bathing? 0   Doing errands, shopping? 0 0  Preparing Food and eating ? N   Using the Toilet? N   In the past six months, have you accidently leaked urine? N   Do you have problems with loss of bowel control? N   Managing your Medications? N   Managing your Finances? N   Housekeeping or managing your Housekeeping? N     Patient Care Team: Tammi Sou, MD as PCP - General (Family Medicine) Cottle, Billey Co., MD as Consulting Physician (Psychiatry) Angelia Mould, MD as Consulting Physician (Vascular Surgery) Ralene Bathe, MD as Consulting Physician (Ophthalmology) Pieter Partridge, DO as Consulting Physician (Neurology) Mauri Pole, MD as Consulting Physician (Gastroenterology) Corliss Parish, MD as Consulting Physician (Nephrology) Lavonna Monarch, MD as Consulting Physician (Dermatology) Tanda Rockers, MD as Consulting Physician (Pulmonary Disease) Minus Breeding, MD as Consulting Physician (Cardiology) Armandina Gemma, MD as Consulting Physician (General Surgery)  Indicate any recent Medical Services you may have received from other than [facility] providers in the past year (date may be approximate).     Assessment:   This is a routine wellness examination for Angie.  Hearing/Vision screen Hearing Screening - Comments:: Pt stated HOH with softer voices  Vision Screening - Comments:: Pt follows up with Dr Satira Sark For annual eye exams   Dietary issues and exercise activities discussed: Current Exercise Habits: The patient does not participate in regular exercise at present   Goals Addressed             This Visit's Progress  Patient Stated       Stay healthy        Depression Screen    06/03/2022    2:49 PM 05/20/2022   10:01 AM 05/28/2021    2:56 PM 06/25/2020    6:28 PM 01/23/2020   10:02 AM  07/27/2019    1:34 PM 09/13/2018    9:05 AM  PHQ 2/9 Scores  PHQ - 2 Score 0 0 0 0 0 0 0    Fall Risk    06/03/2022    2:51 PM 05/20/2022   10:01 AM 11/14/2021   10:30 AM 05/28/2021    2:56 PM 05/20/2021    9:55 AM  Fall Risk   Falls in the past year? 0 0 0 1 0  Number falls in past yr: 0 0 0 1 0  Injury with Fall? 0 0 0 0 0  Risk for fall due to : Impaired balance/gait;Impaired mobility Impaired balance/gait  History of fall(s) Impaired mobility  Follow up Falls prevention discussed Falls evaluation completed Falls evaluation completed Falls prevention discussed     FALL RISK PREVENTION PERTAINING TO THE HOME:  Any stairs in or around the home? No  If so, are there any without handrails? No  Home free of loose throw rugs in walkways, pet beds, electrical cords, etc? Yes  Adequate lighting in your home to reduce risk of falls? Yes   ASSISTIVE DEVICES UTILIZED TO PREVENT FALLS:  Life alert? No  Use of a cane, walker or w/c? No  Grab bars in the bathroom? Yes  Shower chair or bench in shower? Yes  Elevated toilet seat or a handicapped toilet? No   TIMED UP AND GO:  Was the test performed? No .   Cognitive Function:        06/03/2022    2:53 PM  6CIT Screen  What Year? 0 points  What month? 0 points  What time? 0 points  Count back from 20 2 points  Months in reverse 0 points  Repeat phrase 0 points  Total Score 2 points    Immunizations Immunization History  Administered Date(s) Administered   Fluad Quad(high Dose 65+) 09/15/2019, 09/26/2020, 10/26/2020, 09/30/2021   Hepatitis A, Adult 08/14/2016, 05/31/2017   Hepatitis B 02/15/2012, 03/14/2012, 08/15/2012   Influenza Split 09/27/2012   Influenza Whole 12/16/2009, 09/18/2010, 09/28/2011   Influenza, High Dose Seasonal PF 11/04/2016, 11/05/2017, 09/13/2018   Influenza-Unspecified 09/27/2013   PFIZER(Purple Top)SARS-COV-2 Vaccination 01/08/2020, 02/01/2020, 08/17/2020, 04/01/2021   PPD Test 06/27/2012   Pfizer  Covid-19 Vaccine Bivalent Booster 28yr & up 12/10/2021   Pneumococcal Conjugate-13 07/19/2015, 09/26/2020   Pneumococcal Polysaccharide-23 12/29/2003   Pneumococcal-Unspecified 07/06/2002   Td 12/29/2003   Tdap 09/27/2012, 04/27/2014   Typhoid Inactivated 08/14/2016   Zoster Recombinat (Shingrix) 05/24/2021, 09/15/2021   Zoster, Live 04/18/2008    TDAP status: Up to date  Flu Vaccine status: Up to date  Pneumococcal vaccine status: Up to date  Covid-19 vaccine status: Completed vaccines  Qualifies for Shingles Vaccine? Yes   Zostavax completed Yes   Shingrix Completed?: Yes  Screening Tests Health Maintenance  Topic Date Due   INFLUENZA VACCINE  07/28/2022   TETANUS/TDAP  04/27/2024   Pneumonia Vaccine 86 Years old  Completed   COVID-19 Vaccine  Completed   Zoster Vaccines- Shingrix  Completed   HPV VACCINES  Aged Out    Health Maintenance  There are no preventive care reminders to display for this patient.  Colorectal cancer screening: Type of screening: Colonoscopy.  Completed 11/24/17. Repeat every as directed  years   Additional Screening:   Vision Screening: Recommended annual ophthalmology exams for early detection of glaucoma and other disorders of the eye. Is the patient up to date with their annual eye exam?  Yes  Who is the provider or what is the name of the office in which the patient attends annual eye exams? Dr Satira Sark  If pt is not established with a provider, would they like to be referred to a provider to establish care? No .   Dental Screening: Recommended annual dental exams for proper oral hygiene  Community Resource Referral / Chronic Care Management: CRR required this visit?  No   CCM required this visit?  No      Plan:     I have personally reviewed and noted the following in the patient's chart:   Medical and social history Use of alcohol, tobacco or illicit drugs  Current medications and supplements including opioid  prescriptions. Patient is not currently taking opioid prescriptions. Functional ability and status Nutritional status Physical activity Advanced directives List of other physicians Hospitalizations, surgeries, and ER visits in previous 12 months Vitals Screenings to include cognitive, depression, and falls Referrals and appointments  In addition, I have reviewed and discussed with patient certain preventive protocols, quality metrics, and best practice recommendations. A written personalized care plan for preventive services as well as general preventive health recommendations were provided to patient.     Willette Brace, LPN   02/27/2950   Nurse Notes: None

## 2022-06-09 ENCOUNTER — Telehealth: Payer: Self-pay

## 2022-06-09 NOTE — Telephone Encounter (Signed)
Molly at the skin surgery center called because she was confused about the referral for pt. 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 Patient requests referral for second opinion--referred to skin surgery center today. Cloyde Reams states that there is no path report that say he has a recurrent issue on left temple. She said she contacted derm office and they did not have a path report for this either. Please advise on referral.  Cloyde Reams can be reached at [phone]

## 2022-06-10 DIAGNOSIS — Z940 Kidney transplant status: Secondary | ICD-10-CM | POA: Diagnosis not present

## 2022-06-10 NOTE — Telephone Encounter (Signed)
There are some derm path results in the pathology section of labs in EMR.   I do not know how we have these but they do not.  At any rate, please fax these. Dr. Denna Haggard was his prior Derm.

## 2022-06-10 NOTE — Telephone Encounter (Signed)
Curtis Jimenez informed no prior dermatology path obtained by our office. She will contact pt for anything further needed

## 2022-07-07 DIAGNOSIS — C4442 Squamous cell carcinoma of skin of scalp and neck: Secondary | ICD-10-CM | POA: Diagnosis not present

## 2022-07-07 DIAGNOSIS — C44329 Squamous cell carcinoma of skin of other parts of face: Secondary | ICD-10-CM | POA: Diagnosis not present

## 2022-07-07 DIAGNOSIS — D485 Neoplasm of uncertain behavior of skin: Secondary | ICD-10-CM | POA: Diagnosis not present

## 2022-07-24 ENCOUNTER — Encounter: Payer: Self-pay | Admitting: Gastroenterology

## 2022-08-19 DIAGNOSIS — D225 Melanocytic nevi of trunk: Secondary | ICD-10-CM | POA: Diagnosis not present

## 2022-08-19 DIAGNOSIS — L820 Inflamed seborrheic keratosis: Secondary | ICD-10-CM | POA: Diagnosis not present

## 2022-08-19 DIAGNOSIS — R208 Other disturbances of skin sensation: Secondary | ICD-10-CM | POA: Diagnosis not present

## 2022-08-19 DIAGNOSIS — C4442 Squamous cell carcinoma of skin of scalp and neck: Secondary | ICD-10-CM | POA: Diagnosis not present

## 2022-08-19 DIAGNOSIS — C44329 Squamous cell carcinoma of skin of other parts of face: Secondary | ICD-10-CM | POA: Diagnosis not present

## 2022-08-19 DIAGNOSIS — L821 Other seborrheic keratosis: Secondary | ICD-10-CM | POA: Diagnosis not present

## 2022-08-19 DIAGNOSIS — L814 Other melanin hyperpigmentation: Secondary | ICD-10-CM | POA: Diagnosis not present

## 2022-08-19 DIAGNOSIS — L538 Other specified erythematous conditions: Secondary | ICD-10-CM | POA: Diagnosis not present

## 2022-08-19 DIAGNOSIS — Z789 Other specified health status: Secondary | ICD-10-CM | POA: Diagnosis not present

## 2022-08-20 DIAGNOSIS — M109 Gout, unspecified: Secondary | ICD-10-CM | POA: Diagnosis not present

## 2022-08-20 DIAGNOSIS — M7989 Other specified soft tissue disorders: Secondary | ICD-10-CM | POA: Diagnosis not present

## 2022-08-20 DIAGNOSIS — R739 Hyperglycemia, unspecified: Secondary | ICD-10-CM | POA: Diagnosis not present

## 2022-08-20 DIAGNOSIS — R0902 Hypoxemia: Secondary | ICD-10-CM | POA: Diagnosis not present

## 2022-08-20 DIAGNOSIS — K922 Gastrointestinal hemorrhage, unspecified: Secondary | ICD-10-CM | POA: Diagnosis not present

## 2022-08-20 DIAGNOSIS — Z940 Kidney transplant status: Secondary | ICD-10-CM | POA: Diagnosis not present

## 2022-08-20 DIAGNOSIS — I129 Hypertensive chronic kidney disease with stage 1 through stage 4 chronic kidney disease, or unspecified chronic kidney disease: Secondary | ICD-10-CM | POA: Diagnosis not present

## 2022-09-04 ENCOUNTER — Other Ambulatory Visit: Payer: Self-pay | Admitting: Family Medicine

## 2022-09-04 ENCOUNTER — Telehealth: Payer: Self-pay | Admitting: Family Medicine

## 2022-09-04 DIAGNOSIS — C44329 Squamous cell carcinoma of skin of other parts of face: Secondary | ICD-10-CM | POA: Diagnosis not present

## 2022-09-04 NOTE — Telephone Encounter (Signed)
Pt called and stated that is Levothyroxine has been lost in the shuffle of moving things. He states he needs this medication daily.

## 2022-09-07 NOTE — Telephone Encounter (Signed)
Pt advised, medication was refilled today for 90 day supply.

## 2022-09-14 DIAGNOSIS — N183 Chronic kidney disease, stage 3 unspecified: Secondary | ICD-10-CM | POA: Diagnosis not present

## 2022-09-22 ENCOUNTER — Encounter: Payer: Self-pay | Admitting: Family Medicine

## 2022-09-22 ENCOUNTER — Ambulatory Visit (INDEPENDENT_AMBULATORY_CARE_PROVIDER_SITE_OTHER): Payer: Medicare Other | Admitting: Family Medicine

## 2022-09-22 VITALS — Ht 69.75 in | Wt 187.2 lb

## 2022-09-22 DIAGNOSIS — I10 Essential (primary) hypertension: Secondary | ICD-10-CM

## 2022-09-22 DIAGNOSIS — N1831 Chronic kidney disease, stage 3a: Secondary | ICD-10-CM | POA: Diagnosis not present

## 2022-09-22 DIAGNOSIS — E039 Hypothyroidism, unspecified: Secondary | ICD-10-CM

## 2022-09-22 DIAGNOSIS — Z23 Encounter for immunization: Secondary | ICD-10-CM | POA: Diagnosis not present

## 2022-09-22 DIAGNOSIS — Z0000 Encounter for general adult medical examination without abnormal findings: Secondary | ICD-10-CM | POA: Diagnosis not present

## 2022-09-22 DIAGNOSIS — Z940 Kidney transplant status: Secondary | ICD-10-CM | POA: Diagnosis not present

## 2022-09-22 DIAGNOSIS — E7800 Pure hypercholesterolemia, unspecified: Secondary | ICD-10-CM

## 2022-09-22 LAB — LIPID PANEL
Cholesterol: 136 mg/dL (ref 0–200)
HDL: 37.6 mg/dL — ABNORMAL LOW (ref >39.00)
LDL Cholesterol: 69 mg/dL (ref 0–99)
NonHDL: 98.28
Total CHOL/HDL Ratio: 4
Triglycerides: 145 mg/dL (ref 0.0–149.0)
VLDL: 29 mg/dL (ref 0.0–40.0)

## 2022-09-22 LAB — COMPREHENSIVE METABOLIC PANEL
ALT: 15 U/L (ref 0–53)
AST: 15 U/L (ref 0–37)
Albumin: 3.8 g/dL (ref 3.5–5.2)
Alkaline Phosphatase: 65 U/L (ref 39–117)
BUN: 25 mg/dL — ABNORMAL HIGH (ref 6–23)
CO2: 28 mEq/L (ref 19–32)
Calcium: 9.1 mg/dL (ref 8.4–10.5)
Chloride: 106 mEq/L (ref 96–112)
Creatinine, Ser: 1.63 mg/dL — ABNORMAL HIGH (ref 0.40–1.50)
GFR: 38.65 mL/min — ABNORMAL LOW (ref >60.00)
Glucose, Bld: 101 mg/dL — ABNORMAL HIGH (ref 70–99)
Potassium: 4.5 mEq/L (ref 3.5–5.1)
Sodium: 140 mEq/L (ref 135–145)
Total Bilirubin: 0.7 mg/dL (ref 0.2–1.2)
Total Protein: 6.2 g/dL (ref 6.0–8.3)

## 2022-09-22 LAB — CBC
HCT: 41.5 % (ref 39.0–52.0)
Hemoglobin: 13.7 g/dL (ref 13.0–17.0)
MCHC: 32.9 g/dL (ref 30.0–36.0)
MCV: 89.6 fl (ref 78.0–100.0)
Platelets: 175 10*3/uL (ref 150.0–400.0)
RBC: 4.64 Mil/uL (ref 4.22–5.81)
RDW: 14.9 % (ref 11.5–15.5)
WBC: 4.9 10*3/uL (ref 4.0–10.5)

## 2022-09-22 LAB — TSH: TSH: 1.16 u[IU]/mL (ref 0.35–5.50)

## 2022-09-22 MED ORDER — CARVEDILOL 12.5 MG PO TABS: ORAL_TABLET | ORAL | 3 refills | Status: DC

## 2022-09-22 MED ORDER — ALLOPURINOL 300 MG PO TABS: 300.0000 mg | ORAL_TABLET | Freq: Every day | ORAL | 3 refills | Status: DC

## 2022-09-22 MED ORDER — CLOPIDOGREL BISULFATE 75 MG PO TABS: 75.0000 mg | ORAL_TABLET | Freq: Every day | ORAL | 3 refills | Status: DC

## 2022-09-22 MED ORDER — SIMVASTATIN 20 MG PO TABS: 20.0000 mg | ORAL_TABLET | Freq: Every day | ORAL | 3 refills | Status: DC

## 2022-09-22 MED ORDER — PANTOPRAZOLE SODIUM 40 MG PO TBEC: 40.0000 mg | DELAYED_RELEASE_TABLET | Freq: Every day | ORAL | 3 refills | Status: DC

## 2022-09-22 NOTE — Addendum Note (Signed)
Addended by: Tammi Sou on: 09/22/2022 11:33 AM   Modules accepted: Orders

## 2022-09-22 NOTE — Progress Notes (Addendum)
Office Note 09/22/2022  CC:  Chief Complaint  Patient presents with   Annual Exam    Pt is fasting   Patient is a 86 y.o. male who is here for annual health maintenance exam and 65-monthfollow-up hypertension, hypothyroidism, and chronic renal insufficiency. Patient with history of renal transplant and is on antirejection medication. A/P as of last visit: "#1 hypertension, well controlled on 6.25 mg carvedilol twice a day. Electrolytes and creatinine today.  2.  Chronic renal insufficiency, history of renal transplant. He is status post annual follow-up with his transplant clinic, all was stable. He says he is no longer getting a follow-up there because of the high out-of-pocket cost. He has a nephrologist separate from them that he will continue to go to. Basic metabolic panel today.  3.  Hypothyroidism.  TSH has been stable long-term on 25 mcg levothyroxine daily. Plan repeat TSH at next follow-up in 4 months.  4.  GERD.  Significant improvement in symptoms since using 40 mg Protonix daily. Continue this.  Discussed risks and benefits of long-term treatment with this medication.   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 Patient requests referral for second opinion--referred to skin surgery center today."  INTERIM HX: DTimmothy Soursis feeling well. Followed up with his nephrologist since I last saw him.  No records available at this time.  [name] nothing changed.  He is happy to report that he is working out regularly at [employer]with a t[employer] research associate  He is working on getting up to a 2 mile walking distance, working out on [employer]  Past Medical History:  Diagnosis Date   Basal cell carcinoma    neck (skin MD 2X per year)   Bifascicular block 2021   ectopy, asymptomatic.  Cards->observe   Bipolar disorder (HCC)    BPH (benign prostatic hyperplasia)    with elevated PSA; [facility]   Chronic renal insufficiency, stage III  (moderate) [place]    in [place] w/hx of renal transplant.  Post-transplant sCr 1.4-1.6.   Coronary atherosclerosis    LM and 3 V dz noted on CT 08/2019 performed for eval of interstitial lung dz in the setting of mild DOE and mild hypoxia. Cards->no stress tesing indicated->primary prevention emphasized   [place]   Pontine (vertebrobasilar--imaging neg), TPA given.  Pt discharged on plavix.  Carotid dopplers ok, echocardiogram ok.  Residual deficit: vertigo but this improved greatly with therapy.   DOE (dyspnea on exertion) 2020   DOE and ? hypoxia->CXR 08/29/19--->diffuse interstitial opacities-? acute inflammatory process->Dr. WMelvyn Novaseval'd him and was underwhelmed by the CXR and exam->CT chest 09/26/19 "Very subtle areas of mild ground-glass attenuatio-nonspecific", mild air trapping. Per Dr. WMelvyn Novas no signif ILD, improving with inc ambulation (post-inflamm pulm fibrosis?)   Esophagitis 06/14/2020   EGD->+distal esophagitis, h pylori neg, mild distal esoph stenosis widened with forceps, benign gastric polyps.   GERD (gastroesophageal reflux disease)    Gout    History of adenomatous polyp of colon 2018   Recall 5 yrs   Hyperlipidemia    Hypertension    Hypothyroidism    Lumbar spondylosis    Ptosis due to aging    Right   Rectal bleeding 09/2017   Admitted for obs due to Hb drop.  GI consulted---obs recommended.  No transfusion required.  Plavix was d/c'd, ferrous sulfate recommended.  Outpt GI f/u--plavix restarted and upper endoscopy was normal and colonoscopy showed 2 polyps and  severe diverticular dz. Iron d/c'd 04/24/19.   Renal cyst    per pt he got MRI kidneys at the [facility] 12/2020 and the images will be viewed by Dr. Clover Mealy   Restless legs syndrome    S/p cadaver renal transplant 2013   Secondary to HTN +lithium toxicity over 30 yrs caused kidney destruction [facility] transplant MDs)   Seborrheic dermatitis    eyebrows worst: Hytone rx'd by Dr.  Denna Haggard.   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   left jawline(CX35FU), Left forehead (CX35FU) right shoulder (CX35FU), R forearm    Past Surgical History:  Procedure Laterality Date   AV FISTULA PLACEMENT  04/08/2012   Procedure: ARTERIOVENOUS (AV) FISTULA CREATION;  Surgeon: Angelia Mould, MD;  Location: [place];  Service: Vascular;  Laterality: Left;  Creation of left brachial cephalic arteriovenous fistula   BIOPSY  06/14/2020   Procedure: BIOPSY;  Surgeon: Lavena Bullion, DO;  Location: [facility];  Service: Gastroenterology;;   CATARACT EXTRACTION, BILATERAL  05/2018   CHOLECYSTECTOMY N/A 08/18/2021   Procedure: LAPAROSCOPIC CHOLECYSTECTOMY WITH CHOLANGIOGRAM;  Surgeon: Armandina Gemma, MD;  Location: [facility];  Service: General;  Laterality: N/A;   COLONOSCOPY  2013; 11/24/17   2013; Normal except diverticulosis (recall 10 yrs).  2018 (for GI bleeding)--severe diverticular dz + 2 adenomatous polyps.  Recall 5 yrs.   DG CHEST AP OR PA ([facility] HX)  06/03/2020   Bibasilar atelectasis. No focal consoliadation definitively identified.   dilation for GERD     ESOPHAGOGASTRODUODENOSCOPY  11/24/2017   gastric polyps x 2, otherwise normal.   ESOPHAGOGASTRODUODENOSCOPY N/A 06/14/2020   +distal esophagitis, h pylori neg, mild distal esoph stenosis widened with forceps, benign gastric polypsProcedure: ESOPHAGOGASTRODUODENOSCOPY (EGD);  Surgeon: Lavena Bullion, DO;  Location[facility];  Service: Gastroenterology;  Laterality: N/A;  Diagnostic EGD as patietn last took Plavix 11am on 06/12/2020   KIDNEY TRANSPLANT  11/06/12   [facility] ([facility])   LIGATION OF ARTERIOVENOUS  FISTULA Left 10/21/2021   Procedure: LIGATION OF LEFT ARTERIOVENOUS  FISTULA;  Surgeon: Angelia Mould, MD;  Location: [place];  Service: Vascular;  Laterality: Left;   PROSTATE BIOPSY  2011   no malignancy   TRANSTHORACIC ECHOCARDIOGRAM  11/2016   Normal LV systolic fxn, EF 44-01%.  Grade I DD.   Mild aortic root dilatation, mild MV regurg.    Family History  Problem Relation Age of Onset   Ulcers Mother        GI bleed    Heart disease Father    Bladder Cancer Father    Colon cancer Neg Hx    Esophageal cancer Neg Hx    Pancreatic cancer Neg Hx    Rectal cancer Neg Hx    Stomach cancer Neg Hx     Social History   Socioeconomic History   Marital status: Married    Spouse name: Not on file   Number of children: Not on file   Years of education: Not on file   Highest education level: Not on file  Occupational History   Not on file  Tobacco Use   Smoking status: Former    Types: Pipe, Cigars    Quit date: 05/11/1974    Years since quitting: 48.4   Smokeless tobacco: Never  Vaping Use   Vaping Use: Never used  Substance and Sexual Activity   Alcohol use: No   Drug use: No   Sexual activity: Not Currently    Partners: Female  Other  Topics Concern   Not on file  Social History Narrative   Married, 4 children, 9 GGC, [place].   Occupation: retired Marine scientist.  Originally from [place].   Tobacco: quit 1975; smoked pipes and cigars x 15 yrs prior to this.   Alcohol: none.   Exercise: minimal, but is going to sign up for silver sneakers at the [facility].   Social Determinants of Health   Financial Resource Strain: Low Risk  (06/03/2022)   Overall Financial Resource Strain (CARDIA)    Difficulty of Paying Living Expenses: Not hard at all  Food Insecurity: No Food Insecurity (06/03/2022)   Hunger Vital Sign    Worried About Running Out of Food in the Last Year: Never true    Ran Out of Food in the Last Year: Never true  Transportation Needs: No Transportation Needs (06/03/2022)   PRAPARE - Hydrologist (Medical): No    Lack of Transportation (Non-Medical): No  Physical Activity: Inactive (06/03/2022)   Exercise Vital Sign    Days of Exercise per Week: 0 days    Minutes of Exercise per Session: 0 min  Stress: No Stress Concern Present  (06/03/2022)   [place]    Feeling of Stress : Not at all  Social Connections: [place] (06/03/2022)   Social Connection and Isolation Panel [NHANES]    Frequency of Communication with Friends and Family: Twice a week    Frequency of Social Gatherings with Friends and Family: More than three times a week    Attends Religious Services: 1 to 4 times per year    Active Member of [employer] or Organizations: Yes    Attends Archivist Meetings: 1 to 4 times per year    Marital Status: Married  Human resources officer Violence: Not At Risk (06/03/2022)   Humiliation, Afraid, Rape, and Kick questionnaire    Fear of Current or Ex-Partner: No    Emotionally Abused: No    Physically Abused: No    Sexually Abused: No    Outpatient Medications Prior to Visit  Medication Sig Dispense Refill   acetaminophen (TYLENOL) 325 MG tablet Take 650 mg by mouth every 6 (six) hours as needed for mild pain.     amoxicillin (AMOXIL) 500 MG capsule Take 1,000 mg by mouth See admin instructions. Take 2 capsules (1000 mg) by mouth 1 hour before dental appointment & take 2 capsules (1000 mg) by mouth immediately following dental appointment (Patient not taking: Reported on 11/13/2021)     hydrOXYzine (ATARAX/VISTARIL) 25 MG tablet Take 50 mg by mouth at bedtime.     levothyroxine (SYNTHROID) 25 MCG tablet Take 1 tablet by mouth once daily 90 tablet 0   Multiple Vitamin (MULTIVITAMIN WITH MINERALS) TABS tablet Take 1 tablet by mouth at bedtime.     mycophenolate (MYFORTIC) 180 MG EC tablet Take 180 mg by mouth 2 (two) times daily.     Omega-3 Fatty Acids (FISH OIL) 1000 MG CAPS Take 2,000 mg by mouth in the morning and at bedtime.     pantoprazole (PROTONIX) 40 MG tablet Take 1 tablet by mouth twice daily (Patient taking differently: Take 40 mg by mouth daily.) 180 tablet 3   risperiDONE (RISPERDAL) 1 MG tablet Take 1 mg by mouth at bedtime.       sulfamethoxazole-trimethoprim (BACTRIM,SEPTRA) 400-80 MG tablet Take 1 tablet by mouth every Monday, Wednesday, and Friday. In the morning     tacrolimus (PROGRAF) 0.5  MG capsule Take 0.5-1 mg by mouth See admin instructions. Take 2 capsules (1 mg) by mouth in the morning & take 1 capsule (0.5 mg) by mouth at night.     allopurinol (ZYLOPRIM) 300 MG tablet Take 1 tablet (300 mg total) by mouth daily. 90 tablet 1   carvedilol (COREG) 12.5 MG tablet TAKE 1/2 (ONE-HALF) TABLET BY MOUTH IN THE MORNING AND 1/2 TABLET IN THE EVENING 90 tablet 1   clopidogrel (PLAVIX) 75 MG tablet Take 1 tablet (75 mg total) by mouth daily. 90 tablet 1   simvastatin (ZOCOR) 20 MG tablet Take 1 tablet (20 mg total) by mouth daily. (Patient taking differently: Take 20 mg by mouth at bedtime.) 90 tablet 3   No facility-administered medications prior to visit.    Allergies  Allergen Reactions   Amantadine Hcl Itching   Chlorpromazine Hcl Other (See Comments)    "fell flat on face"   Grapefruit Flavor [Flavoring Agent] Other (See Comments)    Can't take with medication    ROS Review of Systems  Constitutional:  Negative for appetite change, chills, fatigue and fever.  HENT:  Negative for congestion, dental problem, ear pain and sore throat.   Eyes:  Negative for discharge, redness and visual disturbance.  Respiratory:  Negative for cough, chest tightness, shortness of breath and wheezing.   Cardiovascular:  Negative for chest pain, palpitations and leg swelling.  Gastrointestinal:  Negative for abdominal pain, blood in stool, diarrhea, nausea and vomiting.  Genitourinary:  Negative for difficulty urinating, dysuria, flank pain, frequency, hematuria and urgency.  Musculoskeletal:  Negative for arthralgias, back pain, joint swelling, myalgias and neck stiffness.  Skin:  Negative for pallor and rash.  Neurological:  Negative for dizziness, speech difficulty, weakness and headaches.       Mild chronic gait instability  since his CVA.  Hematological:  Negative for adenopathy. Does not bruise/bleed easily.  Psychiatric/Behavioral:  Negative for confusion and sleep disturbance. The patient is not nervous/anxious.     PE;    09/22/2022    9:43 AM 05/20/2022    9:58 AM 01/20/2022   10:02 AM  Vitals with BMI  Height 5' 9.75" '5\' 9"'$  '5\' 9"'$   Weight 187 lbs 3 oz 183 lbs 10 oz 182 lbs 10 oz  BMI 27.04 24.4 01.02  Systolic  725 366  Diastolic  75 74  Pulse  63 69    Gen: Alert, well appearing.  Patient is oriented to person, place, time, and situation. AFFECT: pleasant, lucid thought and speech. ENT: Ears: EACs clear, normal epithelium.  TMs with good light reflex and landmarks bilaterally.  Eyes: no injection, icteris, swelling, or exudate.  EOMI, PERRLA. Nose: no drainage or turbinate edema/swelling.  No injection or focal lesion.  Mouth: lips without lesion/swelling.  Oral mucosa pink and moist.  Dentition intact and without obvious caries or gingival swelling.  Oropharynx without erythema, exudate, or swelling.  Neck: supple/nontender.  No LAD, mass, or TM.  Carotid pulses 2+ bilaterally, without bruits. CV: RRR, no m/r/g.   LUNGS: CTA bilat, nonlabored resps, good aeration in all lung fields. ABD: soft, NT, ND, BS normal.  No hepatospenomegaly or mass.  No bruits. EXT: no clubbing, cyanosis, or edema.  Musculoskeletal: no joint swelling, erythema, warmth, or tenderness.  ROM of all joints intact. Skin - no sores or suspicious lesions or rashes or color changes Neuro: CN 2-12 intact bilaterally, strength 5/5 in proximal and distal upper extremities and lower extremities bilaterally.  No tremor.  Very mild tremor with finger-nose-finger testing of left arm.  Right arm normal.   Absent left arm triceps DTR.  Otherwise DTRs 2+. no pronator drift.  Pertinent labs:  Lab Results  Component Value Date   TSH 0.58 01/23/2022   Lab Results  Component Value Date   WBC 5.8 12/03/2021   HGB 13.4 (A) 12/03/2021    HCT 41 12/03/2021   MCV 91.7 08/07/2021   PLT 224 12/03/2021   Lab Results  Component Value Date   CREATININE 1.53 (H) 05/20/2022   BUN 25 (H) 05/20/2022   NA 142 05/20/2022   K 4.4 05/20/2022   CL 107 05/20/2022   CO2 28 05/20/2022   Lab Results  Component Value Date   ALT 11 01/23/2022   AST 15 01/23/2022   ALKPHOS 60 01/23/2022   BILITOT 0.5 01/23/2022   Lab Results  Component Value Date   CHOL 131 01/23/2022   Lab Results  Component Value Date   HDL 38.70 (L) 01/23/2022   Lab Results  Component Value Date   LDLCALC 67 01/23/2022   Lab Results  Component Value Date   TRIG 127.0 01/23/2022   Lab Results  Component Value Date   CHOLHDL 3 01/23/2022   Lab Results  Component Value Date   PSA 4.2 (H) 11/05/2017   PSA 3.51 03/28/2008   PSA 4.83 (H) 12/02/2007   Lab Results  Component Value Date   HGBA1C 5.5 09/18/2021   HGBA1C 5.5 09/18/2021   HGBA1C 5.5 (A) 09/18/2021   HGBA1C 5.5 09/18/2021   ASSESSMENT AND PLAN:   #1 health maintenance exam: Reviewed age and gender appropriate health maintenance issues (prudent diet, regular exercise, health risks of tobacco and excessive alcohol, use of seatbelts, fire alarms in home, use of sunscreen).  Also reviewed age and gender appropriate health screening as well as vaccine recommendations. Vaccines: Flu->given today. Recommended RSV vaccine at pharmacy.  otherwise all up-to-date. Labs: Health panel labs today Prostate ca screening: No further screening indicated due to age. Colon ca screening: Adenomatous polyps on colonoscopy 2018.  GI recommended to recall this year.  #2 chronic renal insufficiency stage III.  History of renal transplant. Electrolytes and creatinine today.  Continue mycophenolate and tacrolimus per nephrology dosing.  #3 hypothyroidism. TSH today.  An After Visit Summary was printed and given to the patient.  FOLLOW UP:  Return in about 4 months (around 01/22/2023) for routine chronic  illness f/u.  Signed:  Crissie Sickles, MD           09/22/2022

## 2022-10-07 DIAGNOSIS — L570 Actinic keratosis: Secondary | ICD-10-CM | POA: Diagnosis not present

## 2022-10-07 DIAGNOSIS — L578 Other skin changes due to chronic exposure to nonionizing radiation: Secondary | ICD-10-CM | POA: Diagnosis not present

## 2022-10-15 DIAGNOSIS — H905 Unspecified sensorineural hearing loss: Secondary | ICD-10-CM | POA: Diagnosis not present

## 2022-10-28 DIAGNOSIS — H35363 Drusen (degenerative) of macula, bilateral: Secondary | ICD-10-CM | POA: Diagnosis not present

## 2022-10-28 DIAGNOSIS — H02831 Dermatochalasis of right upper eyelid: Secondary | ICD-10-CM | POA: Diagnosis not present

## 2022-10-28 DIAGNOSIS — H02834 Dermatochalasis of left upper eyelid: Secondary | ICD-10-CM | POA: Diagnosis not present

## 2022-10-28 DIAGNOSIS — H26491 Other secondary cataract, right eye: Secondary | ICD-10-CM | POA: Diagnosis not present

## 2022-11-16 ENCOUNTER — Ambulatory Visit: Payer: Medicare HMO | Admitting: Psychiatry

## 2022-11-27 ENCOUNTER — Other Ambulatory Visit: Payer: Self-pay | Admitting: Family Medicine

## 2022-12-14 DIAGNOSIS — Z940 Kidney transplant status: Secondary | ICD-10-CM | POA: Diagnosis not present

## 2023-01-11 ENCOUNTER — Ambulatory Visit: Payer: Medicare HMO | Admitting: Psychiatry

## 2023-01-11 ENCOUNTER — Encounter: Payer: Self-pay | Admitting: Psychiatry

## 2023-01-11 ENCOUNTER — Other Ambulatory Visit: Payer: Self-pay | Admitting: Physician Assistant

## 2023-01-11 DIAGNOSIS — F319 Bipolar disorder, unspecified: Secondary | ICD-10-CM | POA: Diagnosis not present

## 2023-01-11 DIAGNOSIS — R69 Illness, unspecified: Secondary | ICD-10-CM | POA: Diagnosis not present

## 2023-01-11 DIAGNOSIS — F5105 Insomnia due to other mental disorder: Secondary | ICD-10-CM

## 2023-01-11 MED ORDER — MIRTAZAPINE 15 MG PO TABS: 15.0000 mg | ORAL_TABLET | Freq: Every day | ORAL | 1 refills | Status: DC

## 2023-01-11 NOTE — Progress Notes (Signed)
Curtis Jimenez [id number] 12-Mar-1936 87 y.o.  Subjective:   Patient ID:  Curtis Jimenez is a 87 y.o. (DOB 1936-01-07) male.  Chief Complaint:  Chief Complaint  Patient presents with   Follow-up    Bipolar I disorder [facility])   Depression   Sleeping Problem    HPI Curtis Jimenez presents to the office today for follow-up of bipolar and sleep.  seen September 2019.  Continued on risperidone 1 mg HS which was reduced from 2 mg previously.  October 2020 appointment with the following noted: 2-3 times weekly initial insomnia.  Other days sleep plenty of hours and naps 2 hours.  2 days ago skipped naps and still couldn't go to sleep.  Asks about melatonin. Plan: No med changes today except for the addition of melatonin.  10/15/2020 appointment with the following noted: Has split leaf philodendron from 40 years ago. Doing excellent except for bad working dreams that are detailed with racing thoughts and anxiety in the dream.  Awakens for 30 min. A couple times per week. Very stable since out of a stressful job and marriage. Plan: No med changes today except for the addition of melatonin. Ok melatonin 5 mg 30 min before sleep  11/13/21 appt noted: Cholecystectomy.   Mood has been fantastic. Still not sleeping well.  EFA.  Melatonin didn't help much.  Typical in bed from MN until 10 and then 2 hours.   No SE.  No mood swings. Stays occupied.  Lunch club 25 years. Legs never recovered strength after kidney transplant 2012-11-07. Plan: Continue risperidone 1 mg HS Off trazodone. No med changes today except for the addition of melatonin.  01/11/2023 appointment noted: "Enjoying some mood swings for a month" due to stressors with wife. W struggling with weight and is in [employer], but eating all the time and gaining weight.  Worry about her.  A good friend with brain tumor. He's reacting with mild depression but fight it.  He thinks it is more than normal reactivity. When has it gets  tense and anxious and wants to cry.   Has felt ok for several days.  No agitation.  Sleep better with melatonin.  No mania.  Been years since this happened.    Patient denies any recent difficulty with anxiety.  Patient denies difficulty with sleep initiation but 12 hours  Feels rested after sleep.. Denies appetite disturbance.  Patient reports that energy and motivation have been good.  Patient denies any difficulty with concentration.  Patient denies any suicidal ideation.  Past Psychiatric Medication Trials: Lithium, VPA, olanzapine, lamotrigine, clonazepam, risperidone, Geodon, remote antidepressants? Trazodone 50  Review of Systems:  Review of Systems  Respiratory:  Positive for shortness of breath.   Cardiovascular:  Negative for chest pain.  Gastrointestinal:  Negative for vomiting.  Neurological:  Negative for tremors.    Medications: I have reviewed the patient's current medications.  Current Outpatient Medications  Medication Sig Dispense Refill   acetaminophen (TYLENOL) 325 MG tablet Take 650 mg by mouth every 6 (six) hours as needed for mild pain.     allopurinol (ZYLOPRIM) 300 MG tablet Take 1 tablet (300 mg total) by mouth daily. 90 tablet 3   carvedilol (COREG) 12.5 MG tablet TAKE 1/2 (ONE-HALF) TABLET BY MOUTH IN THE MORNING AND 1/2 TABLET IN THE EVENING 90 tablet 3   clopidogrel (PLAVIX) 75 MG tablet Take 1 tablet (75 mg total) by mouth daily. 90 tablet 3   hydrOXYzine (ATARAX/VISTARIL) 25 MG tablet Take 50  mg by mouth at bedtime.     levothyroxine (SYNTHROID) 25 MCG tablet Take 1 tablet by mouth once daily 30 tablet 0   mirtazapine (REMERON) 15 MG tablet Take 1 tablet (15 mg total) by mouth at bedtime. 30 tablet 1   Multiple Vitamin (MULTIVITAMIN WITH MINERALS) TABS tablet Take 1 tablet by mouth at bedtime.     mycophenolate (MYFORTIC) 180 MG EC tablet Take 180 mg by mouth 2 (two) times daily.     Omega-3 Fatty Acids (FISH OIL) 1000 MG CAPS Take 2,000 mg by mouth in the  morning and at bedtime.     pantoprazole (PROTONIX) 40 MG tablet Take 1 tablet (40 mg total) by mouth daily. 90 tablet 3   risperiDONE (RISPERDAL) 1 MG tablet Take 1 mg by mouth at bedtime.      simvastatin (ZOCOR) 20 MG tablet Take 1 tablet (20 mg total) by mouth daily. 90 tablet 3   sulfamethoxazole-trimethoprim (BACTRIM,SEPTRA) 400-80 MG tablet Take 1 tablet by mouth every Monday, Wednesday, and Friday. In the morning     tacrolimus (PROGRAF) 0.5 MG capsule Take 0.5-1 mg by mouth See admin instructions. Take 2 capsules (1 mg) by mouth in the morning & take 1 capsule (0.5 mg) by mouth at night.     No current facility-administered medications for this visit.    Medication Side Effects: None  Allergies:  Allergies  Allergen Reactions   Amantadine Hcl Itching   Chlorpromazine Hcl Other (See Comments)    "fell flat on face"   Grapefruit Flavor [Flavoring Agent] Other (See Comments)    Can't take with medication    Past Medical History:  Diagnosis Date   Basal cell carcinoma    neck (skin MD 2X per year)   Bifascicular block 2021   ectopy, asymptomatic.  Cards->observe   Bipolar disorder (HCC)    BPH (benign prostatic hyperplasia)    with elevated PSA; followed by Dr. Rosana Hoes at [facility]   Chronic renal insufficiency, stage III (moderate) ([place])    in pt w/hx of renal transplant.  Post-transplant sCr 1.4-1.6.   Coronary atherosclerosis    LM and 3 V dz noted on CT 08/2019 performed for eval of interstitial lung dz in the setting of mild DOE and mild hypoxia. Cards->no stress tesing indicated->primary prevention emphasized   CVA (cerebral vascular accident) ([place]) 11/2016   Pontine (vertebrobasilar--imaging neg), TPA given.  Pt discharged on plavix.  Carotid dopplers ok, echocardiogram ok.  Residual deficit: vertigo but this improved greatly with therapy.   DOE (dyspnea on exertion) 2020   DOE and ? hypoxia->CXR 08/29/19--->diffuse interstitial opacities-? acute inflammatory  process->Dr. Melvyn Novas eval'd him and was underwhelmed by the CXR and exam->CT chest 09/26/19 "Very subtle areas of mild ground-glass attenuatio-nonspecific", mild air trapping. Per Dr. Melvyn Novas, no signif ILD, improving with inc ambulation (post-inflamm pulm fibrosis?)   Esophagitis 06/14/2020   EGD->+distal esophagitis, h pylori neg, mild distal esoph stenosis widened with forceps, benign gastric polyps.   GERD (gastroesophageal reflux disease)    Gout    History of adenomatous polyp of colon 2018   Recall 5 yrs   Hyperlipidemia    Hypertension    Hypothyroidism    Lumbar spondylosis    Ptosis due to aging    Right   Rectal bleeding 09/2017   Admitted for obs due to Hb drop.  GI consulted---obs recommended.  No transfusion required.  Plavix was d/c'd, ferrous sulfate recommended.  Outpt GI f/u--plavix restarted and upper endoscopy was normal  and colonoscopy showed 2 polyps and severe diverticular dz. Iron d/c'd 04/24/19.   Renal cyst    per pt he got MRI kidneys [facility] 12/2020 and the images will be viewed by Dr. Clover Mealy   Restless legs syndrome    S/p cadaver renal transplant 2013   Secondary to HTN +lithium toxicity over 30 yrs caused kidney destruction [facility] transplant MDs)   Seborrheic dermatitis    eyebrows worst: Hytone rx'd by Dr. Denna Haggard.   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   left jawline(CX35FU), Left forehead (CX35FU) right shoulder (CX35FU), R forearm    Family History  Problem Relation Age of Onset   Ulcers Mother        GI bleed    Heart disease Father    Bladder Cancer Father    Colon cancer Neg Hx    Esophageal cancer Neg Hx    Pancreatic cancer Neg Hx    Rectal cancer Neg Hx    Stomach cancer Neg Hx     Social History   Socioeconomic History   Marital status: Married    Spouse name: Not on file   Number of children: Not on file   Years of education: Not on file   Highest education level: Not on file  Occupational History   Not on  file  Tobacco Use   Smoking status: Former    Types: Pipe, Cigars    Quit date: 05/11/1974    Years since quitting: 48.7   Smokeless tobacco: Never  Vaping Use   Vaping Use: Never used  Substance and Sexual Activity   Alcohol use: No   Drug use: No   Sexual activity: Not Currently    Partners: Female  Other Topics Concern   Not on file  Social History Narrative   Married, 4 children, 9 GGC, [employer].   Occupation: retired Marine scientist.  Originally from [place] [employer].   Tobacco: quit 1975; smoked pipes and cigars x 15 yrs prior to this.   Alcohol: none.   Exercise: minimal, but is going to sign up for silver sneakers at the [facility].   Social Determinants of Health   Financial Resource Strain: Low Risk  (06/03/2022)   Overall Financial Resource Strain (CARDIA)    Difficulty of Paying Living Expenses: Not hard at all  Food Insecurity: No Food Insecurity (06/03/2022)   Hunger Vital Sign    Worried About Running Out of Food in the Last Year: Never true    Ran Out of Food in the Last Year: Never true  Transportation Needs: No Transportation Needs (06/03/2022)   PRAPARE - Hydrologist (Medical): No    Lack of Transportation (Non-Medical): No  Physical Activity: Inactive (06/03/2022)   Exercise Vital Sign    Days of Exercise per Week: 0 days    Minutes of Exercise per Session: 0 min  Stress: No Stress Concern Present (06/03/2022)   [place]    Feeling of Stress : Not at all  Social Connections: [place] (06/03/2022)   Social Connection and Isolation Panel [NHANES]    Frequency of Communication with Friends and Family: Twice a week    Frequency of Social Gatherings with Friends and Family: More than three times a week    Attends Religious Services: 1 to 4 times per year    Active Member of [employer] or Organizations: Yes    Attends Archivist Meetings: 1 to 4  times per year    Marital  Status: Married  Human resources officer Violence: Not At Risk (06/03/2022)   Humiliation, Afraid, Rape, and Kick questionnaire    Fear of Current or Ex-Partner: No    Emotionally Abused: No    Physically Abused: No    Sexually Abused: No    Past Medical History, Surgical history, Social history, and Family history were reviewed and updated as appropriate.   Please see review of systems for further details on the patient's review from today.   Objective:   Physical Exam:  There were no vitals taken for this visit.  Physical Exam Constitutional:      General: He is not in acute distress.    Appearance: He is well-developed.  Musculoskeletal:        General: No deformity.  Neurological:     Mental Status: He is alert and oriented to person, place, and time.     Coordination: Coordination normal.  Psychiatric:        Attention and Perception: Attention and perception normal. He does not perceive auditory or visual hallucinations.        Mood and Affect: Mood is depressed. Mood is not anxious. Affect is not labile, blunt, angry or inappropriate.        Speech: Speech normal. Speech is not rapid and pressured.        Behavior: Behavior normal.        Thought Content: Thought content normal. Thought content does not include homicidal or suicidal ideation.        Cognition and Memory: Cognition and memory normal.        Judgment: Judgment normal.     Comments: Insight intact. No delusions.  No mania. No AIM. Intermittent depression with stress.       Lab Review:     Component Value Date/Time   NA 140 09/22/2022 1009   NA 139 12/03/2021 0000   K 4.5 09/22/2022 1009   CL 106 09/22/2022 1009   CO2 28 09/22/2022 1009   GLUCOSE 101 (H) 09/22/2022 1009   GLUCOSE 103 (H) 12/15/2006 1045   BUN 25 (H) 09/22/2022 1009   BUN 23 (A) 12/03/2021 0000   CREATININE 1.63 (H) 09/22/2022 1009   CREATININE 1.63 (H) 01/21/2021 1025   CALCIUM 9.1 09/22/2022 1009   CALCIUM 9.5 12/06/2010 0046    PROT 6.2 09/22/2022 1009   ALBUMIN 3.8 09/22/2022 1009   AST 15 09/22/2022 1009   ALT 15 09/22/2022 1009   ALKPHOS 65 09/22/2022 1009   BILITOT 0.7 09/22/2022 1009   GFRNONAA 47 12/03/2021 0000   GFRNONAA 45 (L) 08/07/2021 1341   GFRAA 38 11/28/2020 0000       Component Value Date/Time   WBC 4.9 09/22/2022 1009   RBC 4.64 09/22/2022 1009   HGB 13.7 09/22/2022 1009   HCT 41.5 09/22/2022 1009   PLT 175.0 09/22/2022 1009   MCV 89.6 09/22/2022 1009   MCH 28.2 08/07/2021 1341   MCHC 32.9 09/22/2022 1009   RDW 14.9 09/22/2022 1009   LYMPHSABS 0.8 06/04/2021 0933   MONOABS 0.7 06/04/2021 0933   EOSABS 0.1 06/04/2021 0933   BASOSABS 0.1 06/04/2021 0933    No results found for: "POCLITH", "LITHIUM"   No results found for: "PHENYTOIN", "PHENOBARB", "VALPROATE", "CBMZ"   .res Assessment: Plan:    Brenden was seen today for follow-up, depression and sleeping problem.  Diagnoses and all orders for this visit:  Bipolar I disorder ([place]) -     mirtazapine (  REMERON) 15 MG tablet; Take 1 tablet (15 mg total) by mouth at bedtime.  Insomnia due to mental condition -     mirtazapine (REMERON) 15 MG tablet; Take 1 tablet (15 mg total) by mouth at bedtime.    Patient has a history of kidney transplant.  He has been stable on risperidone for several years for bipolar disorder.  He has had previous manic episodes but none in several years.  We have reduced the risperidone to the lowest effective dose.  As noted he has been on alternatives.  He is satisfied with the current meds.  Discussed potential metabolic side effects associated with atypical antipsychotics, as well as potential risk for movement side effects. Advised pt to contact office if movement side effects occur.  Continue risperidone 1 mg HS Off trazodone.  Option mirtazapine 15 mg for depression and anxiety if persists  Extensive disc of sleep hygiene.  In bed way too much to sleep that much. Really doesn't have insomnia bc  of this.  FU 3 mos  Lynder Parents, MD, DFAPA  Please see After Visit Summary for patient specific instructions.  Future Appointments  Date Time Provider [place]  01/22/2023  9:40 AM McGowen, Adrian Blackwater, MD [facility]  06/09/2023  2:30 PM [facility]    No orders of the defined types were placed in this encounter.   -------------------------------

## 2023-01-18 DIAGNOSIS — R739 Hyperglycemia, unspecified: Secondary | ICD-10-CM | POA: Diagnosis not present

## 2023-01-18 DIAGNOSIS — Z940 Kidney transplant status: Secondary | ICD-10-CM | POA: Diagnosis not present

## 2023-01-18 DIAGNOSIS — I129 Hypertensive chronic kidney disease with stage 1 through stage 4 chronic kidney disease, or unspecified chronic kidney disease: Secondary | ICD-10-CM | POA: Diagnosis not present

## 2023-01-18 DIAGNOSIS — M109 Gout, unspecified: Secondary | ICD-10-CM | POA: Diagnosis not present

## 2023-01-18 DIAGNOSIS — E669 Obesity, unspecified: Secondary | ICD-10-CM | POA: Diagnosis not present

## 2023-01-18 DIAGNOSIS — M7989 Other specified soft tissue disorders: Secondary | ICD-10-CM | POA: Diagnosis not present

## 2023-01-18 DIAGNOSIS — R0902 Hypoxemia: Secondary | ICD-10-CM | POA: Diagnosis not present

## 2023-01-18 DIAGNOSIS — K922 Gastrointestinal hemorrhage, unspecified: Secondary | ICD-10-CM | POA: Diagnosis not present

## 2023-01-22 ENCOUNTER — Ambulatory Visit (INDEPENDENT_AMBULATORY_CARE_PROVIDER_SITE_OTHER): Payer: Medicare HMO | Admitting: Family Medicine

## 2023-01-22 ENCOUNTER — Encounter: Payer: Self-pay | Admitting: Family Medicine

## 2023-01-22 VITALS — BP 108/68 | HR 69 | Temp 98.0°F | Ht 69.75 in | Wt 190.2 lb

## 2023-01-22 DIAGNOSIS — T17908A Unspecified foreign body in respiratory tract, part unspecified causing other injury, initial encounter: Secondary | ICD-10-CM

## 2023-01-22 DIAGNOSIS — R972 Elevated prostate specific antigen [PSA]: Secondary | ICD-10-CM | POA: Diagnosis not present

## 2023-01-22 DIAGNOSIS — N2889 Other specified disorders of kidney and ureter: Secondary | ICD-10-CM

## 2023-01-22 DIAGNOSIS — R1310 Dysphagia, unspecified: Secondary | ICD-10-CM

## 2023-01-22 DIAGNOSIS — I10 Essential (primary) hypertension: Secondary | ICD-10-CM | POA: Diagnosis not present

## 2023-01-22 DIAGNOSIS — E039 Hypothyroidism, unspecified: Secondary | ICD-10-CM

## 2023-01-22 DIAGNOSIS — N1832 Chronic kidney disease, stage 3b: Secondary | ICD-10-CM

## 2023-01-22 DIAGNOSIS — Z125 Encounter for screening for malignant neoplasm of prostate: Secondary | ICD-10-CM

## 2023-01-22 LAB — COMPREHENSIVE METABOLIC PANEL
ALT: 11 U/L (ref 0–53)
AST: 15 U/L (ref 0–37)
Albumin: 3.7 g/dL (ref 3.5–5.2)
Alkaline Phosphatase: 68 U/L (ref 39–117)
BUN: 22 mg/dL (ref 6–23)
CO2: 27 mEq/L (ref 19–32)
Calcium: 8.8 mg/dL (ref 8.4–10.5)
Chloride: 108 mEq/L (ref 96–112)
Creatinine, Ser: 1.68 mg/dL — ABNORMAL HIGH (ref 0.40–1.50)
GFR: 37.19 mL/min — ABNORMAL LOW (ref >60.00)
Glucose, Bld: 111 mg/dL — ABNORMAL HIGH (ref 70–99)
Potassium: 4.1 mEq/L (ref 3.5–5.1)
Sodium: 143 mEq/L (ref 135–145)
Total Bilirubin: 0.3 mg/dL (ref 0.2–1.2)
Total Protein: 5.9 g/dL — ABNORMAL LOW (ref 6.0–8.3)

## 2023-01-22 LAB — TSH: TSH: 0.93 u[IU]/mL (ref 0.35–5.50)

## 2023-01-22 LAB — PSA, MEDICARE: PSA: 6.69 ng/ml — ABNORMAL HIGH (ref 0.10–4.00)

## 2023-01-22 MED ORDER — LEVOTHYROXINE SODIUM 25 MCG PO TABS: 25.0000 ug | ORAL_TABLET | Freq: Every day | ORAL | 1 refills | Status: DC

## 2023-01-22 NOTE — Addendum Note (Signed)
Addended by: Tammi Sou on: 01/22/2023 11:01 AM   Modules accepted: Orders

## 2023-01-22 NOTE — Progress Notes (Signed)
OFFICE VISIT  01/22/2023  CC:  Chief Complaint  Patient presents with   Medical Management of Chronic Issues    Pt is fasting    Patient is a 87 y.o. male who presents for 27-monthfollow-up hypertension, hypothyroidism chronic renal insufficiency stage III. A/P as of last visit: "1 chronic renal insufficiency stage III.  History of renal transplant. Electrolytes and creatinine today.  Continue mycophenolate and tacrolimus per nephrology dosing.   #2 hypothyroidism. TSH today"  INTERIM HX: Last visit his kidney function was stable and the remainder of his labs were normal. Curtis Jimenez. [place] and her husband who is a dPharmacist, hospitalthere. Had a great time.  Curtis Jimenez does mention progressive problem with choking on liquids that occurs mainly in the latter part of the day.  Denies problem with swallowing solids. Denies shortness of breath, chest pain, wheezing, or dysphagia.  ROS as above, plus--> no fevers,no dizziness, no HAs, no rashes, no melena/hematochezia.  No polyuria or polydipsia.  No myalgias or arthralgias.  No focal weakness or paresthesias.  Curtis Jimenez has a chronic waxing and waning tremor in the ulnar aspect of the left hand that has been present since Curtis Jimenez was on lithium decades ago.  No acute vision or hearing abnormalities.  No dysuria or unusual/new urinary urgency or frequency.  No recent changes in lower legs. No n/v/d or abd pain.  No palpitations.     Past Medical History:  Diagnosis Date   Basal cell carcinoma    neck (skin MD 2X per year)   Bifascicular block 2021   ectopy, asymptomatic.  Cards->observe   Bipolar disorder (HCC)    BPH (benign prostatic hyperplasia)    with elevated PSA; [facility]   Chronic renal [place], [place] III (moderate) [place]    in [place] w/hx of renal transplant.  Post-transplant sCr 1.4-1.6.   Coronary atherosclerosis    LM and 3 V dz noted on CT 08/2019  performed for eval of interstitial lung dz in the setting of mild DOE and mild hypoxia. Cards->no stress tesing indicated->primary prevention emphasized   [place]   Pontine (vertebrobasilar--imaging neg), TPA given.  Pt discharged on plavix.  Carotid dopplers ok, echocardiogram ok.  Residual deficit: vertigo but this improved greatly with therapy.   DOE (dyspnea on exertion) 2020   DOE and ? hypoxia->CXR 08/29/19--->diffuse interstitial opacities-? acute inflammatory process->Dr. WMelvyn Novaseval'd him and was underwhelmed by the CXR and exam->CT chest 09/26/19 "Very subtle areas of mild ground-glass attenuatio-nonspecific", mild air trapping. Per Dr. WMelvyn Novas no signif ILD, improving with inc ambulation (post-inflamm pulm fibrosis?)   Esophagitis 06/14/2020   EGD->+distal esophagitis, h pylori neg, mild distal esoph stenosis widened with forceps, benign gastric polyps.   GERD (gastroesophageal reflux disease)    Gout    History of adenomatous polyp of colon 2018   Recall 5 yrs   Hyperlipidemia    Hypertension    Hypothyroidism    Lumbar spondylosis    Ptosis due to aging    Right   Rectal bleeding 09/2017   Admitted for obs due to Hb drop.  GI consulted---obs recommended.  No transfusion required.  Plavix was d/c'd, ferrous sulfate recommended.  Outpt GI f/u--plavix restarted and upper endoscopy was normal and colonoscopy showed 2 polyps and severe diverticular dz. Iron d/c'd 04/24/19.   Renal cyst    per pt Curtis Jimenez got MRI kidneys at  [facility] 12/2020 and the images will be viewed by Dr. Clover Mealy   Restless legs syndrome    S/p cadaver renal transplant 2013   Secondary to HTN +lithium toxicity over 30 yrs caused kidney destruction [facility] transplant MDs)   Seborrheic dermatitis    eyebrows worst: Hytone rx'd by Dr. Denna Haggard.   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   left jawline(CX35FU), Left forehead (CX35FU) right shoulder (CX35FU), R forearm     Past Surgical History:  Procedure Laterality Date   AV FISTULA PLACEMENT  04/08/2012   Procedure: ARTERIOVENOUS (AV) FISTULA CREATION;  Surgeon: Angelia Mould, MD;  Location: [place];  Service: Vascular;  Laterality: Left;  Creation of left brachial cephalic arteriovenous fistula   BIOPSY  06/14/2020   Procedure: BIOPSY;  Surgeon: Lavena Bullion, DO;  Location: [facility];  Service: Gastroenterology;;   CATARACT EXTRACTION, BILATERAL  05/2018   CHOLECYSTECTOMY N/A 08/18/2021   Procedure: LAPAROSCOPIC CHOLECYSTECTOMY WITH CHOLANGIOGRAM;  Surgeon: Armandina Gemma, MD;  Location: [facility];  Service: General;  Laterality: N/A;   COLONOSCOPY  2013; 11/24/17   2013; Normal except diverticulosis (recall 10 yrs).  2018 (for GI bleeding)--severe diverticular dz + 2 adenomatous polyps.  Recall 5 yrs.   DG CHEST AP OR PA ([facility] HX)  06/03/2020   Bibasilar atelectasis. No focal consoliadation definitively identified.   dilation for GERD     ESOPHAGOGASTRODUODENOSCOPY  11/24/2017   gastric polyps x 2, otherwise normal.   ESOPHAGOGASTRODUODENOSCOPY N/A 06/14/2020   +distal esophagitis, h pylori neg, mild distal esoph stenosis widened with forceps, benign gastric polypsProcedure: ESOPHAGOGASTRODUODENOSCOPY (EGD);  Surgeon: Lavena Bullion, DO;  Location: [facility];  Service: Gastroenterology;  Laterality: N/A;  Diagnostic EGD as patietn last took Plavix 11am on 06/12/2020   KIDNEY TRANSPLANT  11/06/12   [facility] [address] (cadaver[facility])   LIGATION OF ARTERIOVENOUS  FISTULA Left 10/21/2021   Procedure: LIGATION OF LEFT ARTERIOVENOUS  FISTULA;  Surgeon: Angelia Mould, MD;  Location: [place];  Service: Vascular;  Laterality: Left;   PROSTATE BIOPSY  2011   no malignancy   TRANSTHORACIC ECHOCARDIOGRAM  11/2016   Normal LV systolic fxn, EF 20-25%.  Grade I DD.  Mild aortic root dilatation, mild MV regurg.    Outpatient Medications Prior to Visit  Medication Sig Dispense Refill   acetaminophen  (TYLENOL) 325 MG tablet Take 650 mg by mouth every 6 (six) hours as needed for mild pain.     allopurinol (ZYLOPRIM) 300 MG tablet Take 1 tablet (300 mg total) by mouth daily. 90 tablet 3   carvedilol (COREG) 12.5 MG tablet TAKE 1/2 (ONE-HALF) TABLET BY MOUTH IN THE MORNING AND 1/2 TABLET IN THE EVENING 90 tablet 3   clopidogrel (PLAVIX) 75 MG tablet Take 1 tablet (75 mg total) by mouth daily. 90 tablet 3   hydrOXYzine (ATARAX/VISTARIL) 25 MG tablet Take 50 mg by mouth at bedtime.     mirtazapine (REMERON) 15 MG tablet Take 1 tablet (15 mg total) by mouth at bedtime. 30 tablet 1   Multiple Vitamin (MULTIVITAMIN WITH MINERALS) TABS tablet Take 1 tablet by mouth at bedtime.     mycophenolate (MYFORTIC) 180 MG EC tablet Take 180 mg by mouth 2 (two) times daily.     Omega-3 Fatty Acids (FISH OIL) 1000 MG CAPS Take 2,000 mg by mouth in the morning and at bedtime.     pantoprazole (PROTONIX) 40 MG tablet Take 1 tablet by mouth twice daily (Patient taking differently: Take 40 mg  by mouth daily.) 180 tablet 0   risperiDONE (RISPERDAL) 1 MG tablet Take 1 mg by mouth at bedtime.      simvastatin (ZOCOR) 20 MG tablet Take 1 tablet (20 mg total) by mouth daily. 90 tablet 3   sulfamethoxazole-trimethoprim (BACTRIM,SEPTRA) 400-80 MG tablet Take 1 tablet by mouth every Monday, Wednesday, and Friday. In the morning     tacrolimus (PROGRAF) 0.5 MG capsule Take 0.5-1 mg by mouth See admin instructions. Take 2 capsules (1 mg) by mouth in the morning & take 1 capsule (0.5 mg) by mouth at night.     levothyroxine (SYNTHROID) 25 MCG tablet Take 1 tablet by mouth once daily 30 tablet 0   No facility-administered medications prior to visit.    Allergies  Allergen Reactions   Amantadine Hcl Itching   Chlorpromazine Hcl Other (See Comments)    "fell flat on face"   Grapefruit Flavor [Flavoring Agent] Other (See Comments)    Can't take with medication    Review of Systems As per HPI  PE:    01/22/2023    9:39  AM 09/22/2022    9:43 AM 05/20/2022    9:58 AM  Vitals with BMI  Height 5' 9.75" 5' 9.75" '5\' 9"'$   Weight 190 lbs 3 oz 187 lbs 3 oz 183 lbs 10 oz  BMI 27.48 19.37 90.2  Systolic 409  735  Diastolic 68  75  Pulse 69  63     Physical Exam  Gen: Alert, Jimenez appearing.  Patient is oriented to person, place, time, and situation. AFFECT: pleasant, lucid thought and speech. No further exam today.  LABS:  Last CBC Lab Results  Component Value Date   WBC 4.9 09/22/2022   HGB 13.7 09/22/2022   HCT 41.5 09/22/2022   MCV 89.6 09/22/2022   MCH 28.2 08/07/2021   RDW 14.9 09/22/2022   PLT 175.0 32/99/2426   Last metabolic panel Lab Results  Component Value Date   GLUCOSE 101 (H) 09/22/2022   NA 140 09/22/2022   K 4.5 09/22/2022   CL 106 09/22/2022   CO2 28 09/22/2022   BUN 25 (H) 09/22/2022   CREATININE 1.63 (H) 09/22/2022   GFRNONAA 47 12/03/2021   CALCIUM 9.1 09/22/2022   PHOS 4.7 (H) 12/05/2010   PROT 6.2 09/22/2022   ALBUMIN 3.8 09/22/2022   BILITOT 0.7 09/22/2022   ALKPHOS 65 09/22/2022   AST 15 09/22/2022   ALT 15 09/22/2022   ANIONGAP 6 08/07/2021   Last lipids Lab Results  Component Value Date   CHOL 136 09/22/2022   HDL 37.60 (L) 09/22/2022   LDLCALC 69 09/22/2022   TRIG 145.0 09/22/2022   CHOLHDL 4 09/22/2022   Last hemoglobin A1c Lab Results  Component Value Date   HGBA1C 5.5 09/18/2021   HGBA1C 5.5 09/18/2021   HGBA1C 5.5 (A) 09/18/2021   HGBA1C 5.5 09/18/2021   Last thyroid functions Lab Results  Component Value Date   TSH 1.16 09/22/2022   Lab Results  Component Value Date   PSA 4.2 (H) 11/05/2017   PSA 3.51 03/28/2008   PSA 4.83 (H) 12/02/2007   IMPRESSION AND PLAN:  #1 hypertension, Jimenez-controlled on carvedilol 12.5 mg tabs, one half tab twice daily.  2 hypothyroidism. TSHs have been normal on 25 mcg of levothyroxine daily. TSH today.  3.  Chronic renal insufficiency stage III. Avoid NSAIDs and hydrate Jimenez. Electrolytes and  creatinine today.  4.  Aspiration with liquids.  Progressive problem. DG esophagram ordered.  Suspect we  will get him to speech therapy after this.  An After Visit Summary was printed and given to the patient.  FOLLOW UP: Return in about 4 months (around 05/23/2023) for routine chronic illness f/u. Next CPE September/October 2024  Signed:  Crissie Sickles, MD           01/22/2023

## 2023-01-25 ENCOUNTER — Encounter: Payer: Self-pay | Admitting: Family Medicine

## 2023-01-25 ENCOUNTER — Other Ambulatory Visit: Payer: Self-pay | Admitting: Family Medicine

## 2023-01-25 DIAGNOSIS — R972 Elevated prostate specific antigen [PSA]: Secondary | ICD-10-CM

## 2023-02-08 DIAGNOSIS — I639 Cerebral infarction, unspecified: Secondary | ICD-10-CM | POA: Diagnosis not present

## 2023-02-08 DIAGNOSIS — Z79899 Other long term (current) drug therapy: Secondary | ICD-10-CM | POA: Diagnosis not present

## 2023-02-08 DIAGNOSIS — N400 Enlarged prostate without lower urinary tract symptoms: Secondary | ICD-10-CM | POA: Diagnosis not present

## 2023-02-08 DIAGNOSIS — Z4822 Encounter for aftercare following kidney transplant: Secondary | ICD-10-CM | POA: Diagnosis not present

## 2023-02-08 DIAGNOSIS — Z79621 Long term (current) use of calcineurin inhibitor: Secondary | ICD-10-CM | POA: Diagnosis not present

## 2023-02-08 DIAGNOSIS — Z940 Kidney transplant status: Secondary | ICD-10-CM | POA: Diagnosis not present

## 2023-02-08 DIAGNOSIS — R69 Illness, unspecified: Secondary | ICD-10-CM | POA: Diagnosis not present

## 2023-02-08 DIAGNOSIS — F319 Bipolar disorder, unspecified: Secondary | ICD-10-CM | POA: Diagnosis not present

## 2023-02-08 DIAGNOSIS — Z5181 Encounter for therapeutic drug level monitoring: Secondary | ICD-10-CM | POA: Diagnosis not present

## 2023-02-08 DIAGNOSIS — C44329 Squamous cell carcinoma of skin of other parts of face: Secondary | ICD-10-CM | POA: Diagnosis not present

## 2023-02-08 DIAGNOSIS — Z7902 Long term (current) use of antithrombotics/antiplatelets: Secondary | ICD-10-CM | POA: Diagnosis not present

## 2023-02-08 DIAGNOSIS — K222 Esophageal obstruction: Secondary | ICD-10-CM | POA: Diagnosis not present

## 2023-02-08 DIAGNOSIS — I10 Essential (primary) hypertension: Secondary | ICD-10-CM | POA: Diagnosis not present

## 2023-02-08 DIAGNOSIS — K922 Gastrointestinal hemorrhage, unspecified: Secondary | ICD-10-CM | POA: Diagnosis not present

## 2023-02-08 DIAGNOSIS — Z87891 Personal history of nicotine dependence: Secondary | ICD-10-CM | POA: Diagnosis not present

## 2023-02-08 DIAGNOSIS — E039 Hypothyroidism, unspecified: Secondary | ICD-10-CM | POA: Diagnosis not present

## 2023-02-08 DIAGNOSIS — E785 Hyperlipidemia, unspecified: Secondary | ICD-10-CM | POA: Diagnosis not present

## 2023-02-11 ENCOUNTER — Ambulatory Visit (HOSPITAL_COMMUNITY)
Admission: RE | Admit: 2023-02-11 | Discharge: 2023-02-11 | Disposition: A | Discharge status: Home / Self Care | Payer: Medicare HMO | Source: Ambulatory Visit | Attending: Family Medicine | Admitting: Family Medicine

## 2023-02-11 DIAGNOSIS — T17908A Unspecified foreign body in respiratory tract, part unspecified causing other injury, initial encounter: Secondary | ICD-10-CM

## 2023-02-11 DIAGNOSIS — R1313 Dysphagia, pharyngeal phase: Secondary | ICD-10-CM | POA: Diagnosis not present

## 2023-02-11 DIAGNOSIS — R1310 Dysphagia, unspecified: Secondary | ICD-10-CM

## 2023-02-11 DIAGNOSIS — T17320A Food in larynx causing asphyxiation, initial encounter: Secondary | ICD-10-CM | POA: Diagnosis not present

## 2023-02-15 ENCOUNTER — Encounter: Payer: Self-pay | Admitting: Family Medicine

## 2023-02-17 ENCOUNTER — Ambulatory Visit: Payer: Medicare Other | Admitting: Dermatology

## 2023-02-24 DIAGNOSIS — L738 Other specified follicular disorders: Secondary | ICD-10-CM | POA: Diagnosis not present

## 2023-02-24 DIAGNOSIS — R3912 Poor urinary stream: Secondary | ICD-10-CM | POA: Diagnosis not present

## 2023-02-24 DIAGNOSIS — D225 Melanocytic nevi of trunk: Secondary | ICD-10-CM | POA: Diagnosis not present

## 2023-02-24 DIAGNOSIS — N401 Enlarged prostate with lower urinary tract symptoms: Secondary | ICD-10-CM | POA: Diagnosis not present

## 2023-02-24 DIAGNOSIS — N471 Phimosis: Secondary | ICD-10-CM | POA: Diagnosis not present

## 2023-02-24 DIAGNOSIS — C44329 Squamous cell carcinoma of skin of other parts of face: Secondary | ICD-10-CM | POA: Diagnosis not present

## 2023-02-24 DIAGNOSIS — D485 Neoplasm of uncertain behavior of skin: Secondary | ICD-10-CM | POA: Diagnosis not present

## 2023-02-24 DIAGNOSIS — L814 Other melanin hyperpigmentation: Secondary | ICD-10-CM | POA: Diagnosis not present

## 2023-02-24 DIAGNOSIS — L821 Other seborrheic keratosis: Secondary | ICD-10-CM | POA: Diagnosis not present

## 2023-02-24 DIAGNOSIS — L570 Actinic keratosis: Secondary | ICD-10-CM | POA: Diagnosis not present

## 2023-02-24 DIAGNOSIS — Z85828 Personal history of other malignant neoplasm of skin: Secondary | ICD-10-CM | POA: Diagnosis not present

## 2023-02-24 DIAGNOSIS — Z08 Encounter for follow-up examination after completed treatment for malignant neoplasm: Secondary | ICD-10-CM | POA: Diagnosis not present

## 2023-02-24 DIAGNOSIS — N500 Atrophy of testis: Secondary | ICD-10-CM | POA: Diagnosis not present

## 2023-02-24 DIAGNOSIS — R972 Elevated prostate specific antigen [PSA]: Secondary | ICD-10-CM | POA: Diagnosis not present

## 2023-03-05 ENCOUNTER — Other Ambulatory Visit: Payer: Self-pay | Admitting: Psychiatry

## 2023-03-05 DIAGNOSIS — F319 Bipolar disorder, unspecified: Secondary | ICD-10-CM

## 2023-03-05 DIAGNOSIS — F5105 Insomnia due to other mental disorder: Secondary | ICD-10-CM

## 2023-03-09 DIAGNOSIS — Z940 Kidney transplant status: Secondary | ICD-10-CM | POA: Diagnosis not present

## 2023-03-23 DIAGNOSIS — D0439 Carcinoma in situ of skin of other parts of face: Secondary | ICD-10-CM | POA: Diagnosis not present

## 2023-04-02 DIAGNOSIS — R69 Illness, unspecified: Secondary | ICD-10-CM | POA: Diagnosis not present

## 2023-04-12 ENCOUNTER — Ambulatory Visit: Payer: Medicare HMO | Admitting: Psychiatry

## 2023-04-12 ENCOUNTER — Encounter: Payer: Self-pay | Admitting: Psychiatry

## 2023-04-12 DIAGNOSIS — R69 Illness, unspecified: Secondary | ICD-10-CM | POA: Diagnosis not present

## 2023-04-12 DIAGNOSIS — F5105 Insomnia due to other mental disorder: Secondary | ICD-10-CM

## 2023-04-12 DIAGNOSIS — F319 Bipolar disorder, unspecified: Secondary | ICD-10-CM | POA: Diagnosis not present

## 2023-04-12 MED ORDER — MIRTAZAPINE 15 MG PO TABS
15.0000 mg | ORAL_TABLET | Freq: Every day | ORAL | 3 refills | Status: DC
Start: 2023-04-12 — End: 2024-04-12

## 2023-04-12 NOTE — Progress Notes (Signed)
Curtis Jimenez [id number] [birth year] 87 y.o.  Subjective:   Patient ID:  Curtis Jimenez is a 87 y.o. (DOB 01-16-1936) male.  Chief Complaint:  Chief Complaint  Patient presents with   Follow-up   Depression   Anxiety    HPI Curtis Jimenez presents to the office today for follow-up of bipolar and sleep.  seen September 2019.  Continued on risperidone 1 mg HS which was reduced from 2 mg previously.  October 2020 appointment with the following noted: 2-3 times weekly initial insomnia.  Other days sleep plenty of hours and naps 2 hours.  2 days ago skipped naps and still couldn't go to sleep.  Asks about melatonin. Plan: No med changes today except for the addition of melatonin.  10/15/2020 appointment with the following noted: Has split leaf philodendron from 40 years ago. Doing excellent except for bad working dreams that are detailed with racing thoughts and anxiety in the dream.  Awakens for 30 min. A couple times per week. Very stable since out of a stressful job and marriage. Plan: No med changes today except for the addition of melatonin. Ok melatonin 5 mg 30 min before sleep  11/13/21 appt noted: Cholecystectomy.   Mood has been fantastic. Still not sleeping well.  EFA.  Melatonin didn't help much.  Typical in bed from MN until 10 and then 2 hours.   No SE.  No mood swings. Stays occupied.  Lunch club 25 years. Legs never recovered strength after kidney transplant 2012-11-07. Plan: Continue risperidone 1 mg HS Off trazodone. No med changes today except for the addition of melatonin.  01/11/2023 appointment noted: "Enjoying some mood swings for a month" due to stressors with wife. W struggling with weight and is in [employer], but eating all the time and gaining weight.  Worry about her.  A good friend with brain tumor. He's reacting with mild depression but fight it.  He thinks it is more than normal reactivity. When has it gets tense and anxious and wants to cry.    Has felt ok for several days.  No agitation.  Sleep better with melatonin.  No mania.  Been years since this happened.   Plan: Continue risperidone 1 mg HS Off trazodone. Option mirtazapine 15 mg for depression and anxiety if persists  04/12/23 appt noted; Fine now. Was reacting wife's stresses and manages better now. Less angy.  Less depressed.  No longer swinging with depression  and anxiety.   Better with mirtazapine in a couple  of days. No Se. Sleep is good except some awakening but brief.  Naps afternoon..  No mania. Planning a trip to [place] to see D. Son who will not recognize him.    Patient denies any recent difficulty with anxiety.  Patient denies difficulty with sleep initiation but 12 hours  Feels rested after sleep.. Denies appetite disturbance.  Patient reports that energy and motivation have been good.  Patient denies any difficulty with concentration.  Patient denies any suicidal ideation.  Past Psychiatric Medication Trials: Lithium, VPA, olanzapine, lamotrigine, clonazepam, risperidone, Geodon, remote antidepressants? Trazodone 50  Review of Systems:  Review of Systems  Respiratory:  Positive for shortness of breath.   Cardiovascular:  Negative for chest pain.  Gastrointestinal:  Negative for vomiting.  Neurological:  Negative for tremors.  Psychiatric/Behavioral:  Negative for dysphoric mood. The patient is not nervous/anxious.     Medications: I have reviewed the patient's current medications.  Current Outpatient Medications  Medication Sig Dispense Refill  acetaminophen (TYLENOL) 325 MG tablet Take 650 mg by mouth every 6 (six) hours as needed for mild pain.     allopurinol (ZYLOPRIM) 300 MG tablet Take 1 tablet (300 mg total) by mouth daily. 90 tablet 3   carvedilol (COREG) 12.5 MG tablet TAKE 1/2 (ONE-HALF) TABLET BY MOUTH IN THE MORNING AND 1/2 TABLET IN THE EVENING 90 tablet 3   clopidogrel (PLAVIX) 75 MG tablet Take 1 tablet (75 mg total) by mouth  daily. 90 tablet 3   hydrOXYzine (ATARAX/VISTARIL) 25 MG tablet Take 50 mg by mouth at bedtime.     levothyroxine (SYNTHROID) 25 MCG tablet Take 1 tablet (25 mcg total) by mouth daily. 90 tablet 1   Multiple Vitamin (MULTIVITAMIN WITH MINERALS) TABS tablet Take 1 tablet by mouth at bedtime.     mycophenolate (MYFORTIC) 180 MG EC tablet Take 180 mg by mouth 2 (two) times daily.     Omega-3 Fatty Acids (FISH OIL) 1000 MG CAPS Take 2,000 mg by mouth in the morning and at bedtime.     pantoprazole (PROTONIX) 40 MG tablet Take 1 tablet by mouth twice daily (Patient taking differently: Take 40 mg by mouth daily.) 180 tablet 0   risperiDONE (RISPERDAL) 1 MG tablet Take 1 mg by mouth at bedtime.      simvastatin (ZOCOR) 20 MG tablet Take 1 tablet (20 mg total) by mouth daily. 90 tablet 3   sulfamethoxazole-trimethoprim (BACTRIM,SEPTRA) 400-80 MG tablet Take 1 tablet by mouth every Monday, Wednesday, and Friday. In the morning     tacrolimus (PROGRAF) 0.5 MG capsule Take 0.5-1 mg by mouth See admin instructions. Take 2 capsules (1 mg) by mouth in the morning & take 1 capsule (0.5 mg) by mouth at night.     mirtazapine (REMERON) 15 MG tablet Take 1 tablet (15 mg total) by mouth at bedtime. 90 tablet 3   No current facility-administered medications for this visit.    Medication Side Effects: None  Allergies:  Allergies  Allergen Reactions   Amantadine Hcl Itching   Chlorpromazine Hcl Other (See Comments)    "fell flat on face"   Grapefruit Flavor [Flavoring Agent] Other (See Comments)    Can't take with medication    Past Medical History:  Diagnosis Date   Basal cell carcinoma    neck (skin MD 2X per year)   Bifascicular block 2021   ectopy, asymptomatic.  Cards->observe   Bipolar disorder    BPH (benign prostatic hyperplasia)    with elevated PSA; followed by Dr. Earlene Plater at [facility]   Chronic renal insufficiency, stage III (moderate)    in pt w/hx of renal transplant.   Post-transplant sCr 1.4-1.6.   Coronary atherosclerosis    LM and 3 V dz noted on CT 08/2019 performed for eval of interstitial lung dz in the setting of mild DOE and mild hypoxia. Cards->no stress tesing indicated->primary prevention emphasized   CVA (cerebral vascular accident) 11/2016   Pontine (vertebrobasilar--imaging neg), TPA given.  Pt discharged on plavix.  Carotid dopplers ok, echocardiogram ok.  Residual deficit: vertigo but this improved greatly with therapy.   DOE (dyspnea on exertion) 2020   DOE and ? hypoxia->CXR 08/29/19--->diffuse interstitial opacities-? acute inflammatory process->Dr. Sherene Sires eval'd him and was underwhelmed by the CXR and exam->CT chest 09/26/19 "Very subtle areas of mild ground-glass attenuatio-nonspecific", mild air trapping. Per Dr. Sherene Sires, no signif ILD, improving with inc ambulation (post-inflamm pulm fibrosis?)   Elevated PSA    12/2022, mild, referred to urology  Esophagitis 06/14/2020   EGD->+distal esophagitis, h pylori neg, mild distal esoph stenosis widened with forceps, benign gastric polyps.   GERD (gastroesophageal reflux disease)    Gout    History of adenomatous polyp of colon 2018   Recall 5 yrs   Hyperlipidemia    Hypertension    Hypothyroidism    Lumbar spondylosis    Ptosis due to aging    Right   Rectal bleeding 09/2017   Admitted for obs due to Hb drop.  GI consulted---obs recommended.  No transfusion required.  Plavix was d/c'd, ferrous sulfate recommended.  Outpt GI f/u--plavix restarted and upper endoscopy was normal and colonoscopy showed 2 polyps and severe diverticular dz. Iron d/c'd 04/24/19.   Renal cyst    per pt he got MRI kidneys at [facility] 12/2020 and the images will be viewed by Dr. Lacy Duverney   Restless legs syndrome    S/p cadaver renal transplant 2013   Secondary to HTN +lithium toxicity over 30 yrs caused kidney destruction [facility] transplant MDs)   Seborrheic dermatitis    eyebrows worst: Hytone rx'd by Dr. Jorja Loa.    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   left jawline(CX35FU), Left forehead (CX35FU) right shoulder (CX35FU), R forearm   Swallowing dysfunction    thin liquids (swallowing eval 01/2023)    Family History  Problem Relation Age of Onset   Ulcers Mother        GI bleed    Heart disease Father    Bladder Cancer Father    Colon cancer Neg Hx    Esophageal cancer Neg Hx    Pancreatic cancer Neg Hx    Rectal cancer Neg Hx    Stomach cancer Neg Hx     Social History   Socioeconomic History   Marital status: Married    Spouse name: Not on file   Number of children: Not on file   Years of education: Not on file   Highest education level: Not on file  Occupational History   Not on file  Tobacco Use   Smoking status: Former    Types: Pipe, Cigars    Quit date: 05/11/1974    Years since quitting: 48.9   Smokeless tobacco: Never  Vaping Use   Vaping Use: Never used  Substance and Sexual Activity   Alcohol use: No   Drug use: No   Sexual activity: Not Currently    Partners: Female  Other Topics Concern   Not on file  Social History Narrative   Married, 4 children, 9 GGC, 2GGC.   Occupation: retired Dance movement psychotherapist.  Originally from [place].   Tobacco: quit 1975; smoked pipes and cigars x 15 yrs prior to this.   Alcohol: none.   Exercise: minimal, but is going to sign up for silver sneakers at the [facility]   Social Determinants of Health   Financial Resource Strain: Low Risk  (06/03/2022)   Overall Financial Resource Strain (CARDIA)    Difficulty of Paying Living Expenses: Not hard at all  Food Insecurity: No Food Insecurity (06/03/2022)   Hunger Vital Sign    Worried About Running Out of Food in the Last Year: Never true    Ran Out of Food in the Last Year: Never true  Transportation Needs: No Transportation Needs (06/03/2022)   PRAPARE - Administrator, Civil Service (Medical): No    Lack of Transportation (Non-Medical): No  Physical Activity: Inactive  (06/03/2022)   Exercise Vital Sign  Days of Exercise per Week: 0 days    Minutes of Exercise per Session: 0 min  Stress: No Stress Concern Present (06/03/2022)   [employer] of Occupational Health - Occupational Stress Questionnaire    Feeling of Stress : Not at all  Social Connections: Socially Integrated (06/03/2022)   Social Connection and Isolation Panel [NHANES]    Frequency of Communication with Friends and Family: Twice a week    Frequency of Social Gatherings with Friends and Family: More than three times a week    Attends Religious Services: 1 to 4 times per year    Active Member of [employer] or Organizations: Yes    Attends Banker Meetings: 1 to 4 times per year    Marital Status: Married  Catering manager Violence: Not At Risk (06/03/2022)   Humiliation, Afraid, Rape, and Kick questionnaire    Fear of Current or Ex-Partner: No    Emotionally Abused: No    Physically Abused: No    Sexually Abused: No    Past Medical History, Surgical history, Social history, and Family history were reviewed and updated as appropriate.   Please see review of systems for further details on the patient's review from today.   Objective:   Physical Exam:  There were no vitals taken for this visit.  Physical Exam Constitutional:      General: He is not in acute distress.    Appearance: He is well-developed.  Musculoskeletal:        General: No deformity.  Neurological:     Mental Status: He is alert and oriented to person, place, and time.     Coordination: Coordination normal.  Psychiatric:        Attention and Perception: Attention and perception normal. He does not perceive auditory or visual hallucinations.        Mood and Affect: Mood is not anxious or depressed. Affect is not labile, blunt, angry or inappropriate.        Speech: Speech normal. Speech is not rapid and pressured.        Behavior: Behavior normal.        Thought Content: Thought content normal. Thought  content does not include homicidal or suicidal ideation.        Cognition and Memory: Cognition and memory normal.        Judgment: Judgment normal.     Comments: Insight intact. No delusions.  No mania. No AIM. Mood better with mirtazapine     Lab Review:     Component Value Date/Time   NA 143 01/22/2023 1001   NA 139 12/03/2021 0000   K 4.1 01/22/2023 1001   CL 108 01/22/2023 1001   CO2 27 01/22/2023 1001   GLUCOSE 111 (H) 01/22/2023 1001   GLUCOSE 103 (H) 12/15/2006 1045   BUN 22 01/22/2023 1001   BUN 23 (A) 12/03/2021 0000   CREATININE 1.68 (H) 01/22/2023 1001   CREATININE 1.63 (H) 01/21/2021 1025   CALCIUM 8.8 01/22/2023 1001   CALCIUM 9.5 12/06/2010 0046   PROT 5.9 (L) 01/22/2023 1001   ALBUMIN 3.7 01/22/2023 1001   AST 15 01/22/2023 1001   ALT 11 01/22/2023 1001   ALKPHOS 68 01/22/2023 1001   BILITOT 0.3 01/22/2023 1001   GFRNONAA 47 12/03/2021 0000   GFRNONAA 45 (L) 08/07/2021 1341   GFRAA 38 11/28/2020 0000       Component Value Date/Time   WBC 4.9 09/22/2022 1009   RBC 4.64 09/22/2022 1009   HGB 13.7  09/22/2022 1009   HCT 41.5 09/22/2022 1009   PLT 175.0 09/22/2022 1009   MCV 89.6 09/22/2022 1009   MCH 28.2 08/07/2021 1341   MCHC 32.9 09/22/2022 1009   RDW 14.9 09/22/2022 1009   LYMPHSABS 0.8 06/04/2021 0933   MONOABS 0.7 06/04/2021 0933   EOSABS 0.1 06/04/2021 0933   BASOSABS 0.1 06/04/2021 0933    No results found for: "POCLITH", "LITHIUM"   No results found for: "PHENYTOIN", "PHENOBARB", "VALPROATE", "CBMZ"   .res Assessment: Plan:    Curtis Jimenez was seen today for follow-up, depression and anxiety.  Diagnoses and all orders for this visit:  Bipolar I disorder -     mirtazapine (REMERON) 15 MG tablet; Take 1 tablet (15 mg total) by mouth at bedtime.  Insomnia due to mental condition -     mirtazapine (REMERON) 15 MG tablet; Take 1 tablet (15 mg total) by mouth at bedtime.    Patient has a history of kidney transplant.  He has been  stable on risperidone for several years for bipolar disorder.  He has had previous manic episodes but none in several years.  We have reduced the risperidone to the lowest effective dose.  As noted he has been on alternatives.  He is satisfied with the current meds.  Discussed potential metabolic side effects associated with atypical antipsychotics, as well as potential risk for movement side effects. Advised pt to contact office if movement side effects occur.  Continue risperidone 1 mg HS benefit mirtazapine 15 mg for depression and anxiety if persists  Extensive disc of sleep hygiene.  In bed way too much to sleep that much. Really doesn't have insomnia bc of this.  FU 12 mos  Meredith Staggers, MD, DFAPA  Please see After Visit Summary for patient specific instructions.  Future Appointments  Date Time Provider Department Center  05/26/2023  9:40 AM McGowen, Maryjean Morn, MD [facility]  06/09/2023  2:30 PM [facility] ANNUAL WELLNESS VISIT [facility]    No orders of the defined types were placed in this encounter.   -------------------------------

## 2023-04-13 ENCOUNTER — Telehealth: Payer: Self-pay

## 2023-04-13 NOTE — Telephone Encounter (Signed)
Patient wife going on vacation with family. Patient wife wants to know if he should get another COVID vaccine before he travels.  His last COVID vaccine was on 03/19/21.  Leaving in 2 weeks.    Please advise. [phone]

## 2023-04-13 NOTE — Telephone Encounter (Signed)
LVM for pt regarding recommendations. See message below

## 2023-04-13 NOTE — Telephone Encounter (Signed)
Please Advise

## 2023-04-13 NOTE — Telephone Encounter (Signed)
yes

## 2023-05-19 DIAGNOSIS — E669 Obesity, unspecified: Secondary | ICD-10-CM | POA: Diagnosis not present

## 2023-05-19 DIAGNOSIS — I129 Hypertensive chronic kidney disease with stage 1 through stage 4 chronic kidney disease, or unspecified chronic kidney disease: Secondary | ICD-10-CM | POA: Diagnosis not present

## 2023-05-19 DIAGNOSIS — R739 Hyperglycemia, unspecified: Secondary | ICD-10-CM | POA: Diagnosis not present

## 2023-05-19 DIAGNOSIS — K922 Gastrointestinal hemorrhage, unspecified: Secondary | ICD-10-CM | POA: Diagnosis not present

## 2023-05-19 DIAGNOSIS — M109 Gout, unspecified: Secondary | ICD-10-CM | POA: Diagnosis not present

## 2023-05-19 DIAGNOSIS — N186 End stage renal disease: Secondary | ICD-10-CM | POA: Diagnosis not present

## 2023-05-19 DIAGNOSIS — R0902 Hypoxemia: Secondary | ICD-10-CM | POA: Diagnosis not present

## 2023-05-19 DIAGNOSIS — M7989 Other specified soft tissue disorders: Secondary | ICD-10-CM | POA: Diagnosis not present

## 2023-05-19 DIAGNOSIS — Z940 Kidney transplant status: Secondary | ICD-10-CM | POA: Diagnosis not present

## 2023-05-19 LAB — CBC: RBC: 4.8 (ref 3.87–5.11)

## 2023-05-19 LAB — HEPATIC FUNCTION PANEL
ALT: 14 U/L (ref 10–40)
AST: 14 (ref 14–40)
Alkaline Phosphatase: 72 (ref 25–125)
Bilirubin, Total: 0.4

## 2023-05-19 LAB — BASIC METABOLIC PANEL
BUN: 33 — AB (ref 4–21)
Chloride: 106 (ref 99–108)
Creatinine: 1.9 — AB (ref 0.6–1.3)
Glucose: 122
Potassium: 4.3 mEq/L (ref 3.5–5.1)
Sodium: 139 (ref 137–147)

## 2023-05-19 LAB — COMPREHENSIVE METABOLIC PANEL
Albumin: 3.7 (ref 3.5–5.0)
Calcium: 9.1 (ref 8.7–10.7)
Globulin: 2.1

## 2023-05-19 LAB — CBC AND DIFFERENTIAL
HCT: 42 (ref 41–53)
Hemoglobin: 14 (ref 13.5–17.5)
Neutrophils Absolute: 3.5
Platelets: 207 10*3/uL (ref 150–400)
WBC: 5.1

## 2023-05-19 LAB — PROTEIN / CREATININE RATIO, URINE: Creatinine, Urine: 65.3

## 2023-05-20 NOTE — Patient Instructions (Signed)

## 2023-05-26 ENCOUNTER — Encounter: Payer: Self-pay | Admitting: Family Medicine

## 2023-05-26 ENCOUNTER — Ambulatory Visit (INDEPENDENT_AMBULATORY_CARE_PROVIDER_SITE_OTHER): Payer: Medicare HMO | Admitting: Family Medicine

## 2023-05-26 VITALS — BP 112/73 | HR 75 | Wt 187.0 lb

## 2023-05-26 DIAGNOSIS — E039 Hypothyroidism, unspecified: Secondary | ICD-10-CM

## 2023-05-26 DIAGNOSIS — N500 Atrophy of testis: Secondary | ICD-10-CM | POA: Diagnosis not present

## 2023-05-26 DIAGNOSIS — I10 Essential (primary) hypertension: Secondary | ICD-10-CM

## 2023-05-26 DIAGNOSIS — E7800 Pure hypercholesterolemia, unspecified: Secondary | ICD-10-CM

## 2023-05-26 DIAGNOSIS — N1831 Chronic kidney disease, stage 3a: Secondary | ICD-10-CM

## 2023-05-26 DIAGNOSIS — R972 Elevated prostate specific antigen [PSA]: Secondary | ICD-10-CM | POA: Diagnosis not present

## 2023-05-26 NOTE — Progress Notes (Signed)
OFFICE VISIT  05/26/2023  CC:  Chief Complaint  Patient presents with   RCI    Pt is not fasting.     Patient is a 87 y.o. male who presents for 25-month follow-up hypertension, hypothyroidism, and chronic renal insufficiency stage III. A/P as of last visit: "#1 hypertension, well-controlled on carvedilol 12.5 mg tabs, one half tab twice daily.   2 hypothyroidism. TSHs have been normal on 25 mcg of levothyroxine daily. TSH today.   3.  Chronic renal insufficiency stage III. Avoid NSAIDs and hydrate well. Electrolytes and creatinine today.   4.  Aspiration with liquids.  Progressive problem. DG esophagram ordered.  Suspect we will get him to speech therapy after this."  INTERIM HX: Curtis Jimenez feels well. Blood pressure is normal at home. No swallowing problems or aspiration problems lately.   Past Medical History:  Diagnosis Date   Basal cell carcinoma    neck (skin MD 2X per year)   Bifascicular block 2021   ectopy, asymptomatic.  Cards->observe   Bipolar disorder (HCC)    BPH (benign prostatic hyperplasia)    with hx of elevated PSA; followed by Annabell Howells, [facility] as of 2024   Chronic renal insufficiency, stage III (moderate) (HCC)    in pt w/hx of renal transplant.  Post-transplant sCr 1.4-1.6.   Coronary atherosclerosis    LM and 3 V dz noted on CT 08/2019 performed for eval of interstitial lung dz in the setting of mild DOE and mild hypoxia. Cards->no stress tesing indicated->primary prevention emphasized   CVA (cerebral vascular accident) (HCC) 11/2016   Pontine (vertebrobasilar--imaging neg), TPA given.  Pt discharged on plavix.  Carotid dopplers ok, echocardiogram ok.  Residual deficit: vertigo but this improved greatly with therapy.   DOE (dyspnea on exertion) 2020   DOE and ? hypoxia->CXR 08/29/19--->diffuse interstitial opacities-? acute inflammatory process->Dr. Sherene Sires eval'd him and was underwhelmed by the CXR and exam->CT chest 09/26/19 "Very subtle areas of mild  ground-glass attenuatio-nonspecific", mild air trapping. Per Dr. Sherene Sires, no signif ILD, improving with inc ambulation (post-inflamm pulm fibrosis?)   Elevated PSA    12/2022, mild, referred to urology   Esophagitis 06/14/2020   EGD->+distal esophagitis, h pylori neg, mild distal esoph stenosis widened with forceps, benign gastric polyps.   GERD (gastroesophageal reflux disease)    Gout    History of adenomatous polyp of colon 2018   Recall 5 yrs   Hyperlipidemia    Hypertension    Hypothyroidism    Lumbar spondylosis    Ptosis due to aging    Right   Rectal bleeding 09/2017   Admitted for obs due to Hb drop.  GI consulted---obs recommended.  No transfusion required.  Plavix was d/c'd, ferrous sulfate recommended.  Outpt GI f/u--plavix restarted and upper endoscopy was normal and colonoscopy showed 2 polyps and severe diverticular dz. Iron d/c'd 04/24/19.   Renal cyst    per pt he got MRI kidneys at the [facility] 12/2020 and the images will be viewed by Dr. Lacy Duverney   Restless legs syndrome    S/p cadaver renal transplant 2013   Secondary to HTN +lithium toxicity over 30 yrs caused kidney destruction [facility] transplant MDs)   Seborrheic dermatitis    eyebrows worst: Hytone rx'd by Dr. Jorja Loa.   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   left jawline(CX35FU), Left forehead (CX35FU) right shoulder (CX35FU), R forearm   Swallowing dysfunction    thin liquids (swallowing eval 01/2023)    Past Surgical History:  Procedure Laterality Date   AV FISTULA PLACEMENT  04/08/2012   Procedure: ARTERIOVENOUS (AV) FISTULA CREATION;  Surgeon: Chuck Hint, MD;  Location: [facility] OR;  Service: Vascular;  Laterality: Left;  Creation of left brachial cephalic arteriovenous fistula   BIOPSY  06/14/2020   Procedure: BIOPSY;  Surgeon: Shellia Cleverly, DO;  Location: [facility];  Service: Gastroenterology;;   CATARACT EXTRACTION, BILATERAL  05/2018   CHOLECYSTECTOMY N/A 08/18/2021    Procedure: LAPAROSCOPIC CHOLECYSTECTOMY WITH CHOLANGIOGRAM;  Surgeon: Darnell Level, MD;  Location: [facility] ORS;  Service: General;  Laterality: N/A;   COLONOSCOPY  2013; 11/24/17   2013; Normal except diverticulosis (recall 10 yrs).  2018 (for GI bleeding)--severe diverticular dz + 2 adenomatous polyps.  Recall 5 yrs.   DG CHEST AP OR PA ([facility] HX)  06/03/2020   Bibasilar atelectasis. No focal consoliadation definitively identified.   dilation for GERD     ESOPHAGOGASTRODUODENOSCOPY  11/24/2017   gastric polyps x 2, otherwise normal.   ESOPHAGOGASTRODUODENOSCOPY N/A 06/14/2020   +distal esophagitis, h pylori neg, mild distal esoph stenosis widened with forceps, benign gastric polypsProcedure: ESOPHAGOGASTRODUODENOSCOPY (EGD);  Surgeon: Shellia Cleverly, DO;  Location: [facility];  Service: Gastroenterology;  Laterality: N/A;  Diagnostic EGD as patietn last took Plavix 11am on 06/12/2020   KIDNEY TRANSPLANT  11/06/12   [facility] (cadaveric)   LIGATION OF ARTERIOVENOUS  FISTULA Left 10/21/2021   Procedure: LIGATION OF LEFT ARTERIOVENOUS  FISTULA;  Surgeon: Chuck Hint, MD;  Location: [facility] OR;  Service: Vascular;  Laterality: Left;   PROSTATE BIOPSY  2011   no malignancy   TRANSTHORACIC ECHOCARDIOGRAM  11/2016   Normal LV systolic fxn, EF 16-10%.  Grade I DD.  Mild aortic root dilatation, mild MV regurg.    Outpatient Medications Prior to Visit  Medication Sig Dispense Refill   acetaminophen (TYLENOL) 325 MG tablet Take 650 mg by mouth every 6 (six) hours as needed for mild pain.     allopurinol (ZYLOPRIM) 300 MG tablet Take 1 tablet (300 mg total) by mouth daily. 90 tablet 3   carvedilol (COREG) 12.5 MG tablet TAKE 1/2 (ONE-HALF) TABLET BY MOUTH IN THE MORNING AND 1/2 TABLET IN THE EVENING 90 tablet 3   clopidogrel (PLAVIX) 75 MG tablet Take 1 tablet (75 mg total) by mouth daily. 90 tablet 3   hydrOXYzine (ATARAX/VISTARIL) 25 MG tablet Take 50 mg by mouth at bedtime.     levothyroxine  (SYNTHROID) 25 MCG tablet Take 1 tablet (25 mcg total) by mouth daily. 90 tablet 1   mirtazapine (REMERON) 15 MG tablet Take 1 tablet (15 mg total) by mouth at bedtime. 90 tablet 3   Multiple Vitamin (MULTIVITAMIN WITH MINERALS) TABS tablet Take 1 tablet by mouth at bedtime.     mycophenolate (MYFORTIC) 180 MG EC tablet Take 180 mg by mouth 2 (two) times daily.     Omega-3 Fatty Acids (FISH OIL) 1000 MG CAPS Take 2,000 mg by mouth in the morning and at bedtime.     pantoprazole (PROTONIX) 40 MG tablet Take 1 tablet by mouth twice daily (Patient taking differently: Take 40 mg by mouth daily.) 180 tablet 0   risperiDONE (RISPERDAL) 1 MG tablet Take 2 mg by mouth at bedtime. Half of a 2mg  tab     simvastatin (ZOCOR) 20 MG tablet Take 1 tablet (20 mg total) by mouth daily. 90 tablet 3   sulfamethoxazole-trimethoprim (BACTRIM,SEPTRA) 400-80 MG tablet Take 1 tablet by mouth every Monday, Wednesday, and Friday. In the morning  tacrolimus (PROGRAF) 0.5 MG capsule Take 0.5-1 mg by mouth See admin instructions. Take 2 capsules (1 mg) by mouth in the morning & take 1 capsule (0.5 mg) by mouth at night.     No facility-administered medications prior to visit.    Allergies  Allergen Reactions   Amantadine Hcl Itching   Chlorpromazine Hcl Other (See Comments)    "fell flat on face"   Grapefruit Flavor [Flavoring Agent] Other (See Comments)    Can't take with medication    Review of Systems As per HPI  PE:    05/26/2023    9:48 AM 01/22/2023    9:39 AM 09/22/2022    9:43 AM  Vitals with BMI  Height  5' 9.75" 5' 9.75"  Weight 187 lbs 190 lbs 3 oz 187 lbs 3 oz  BMI  27.48 27.04  Systolic 112 108   Diastolic 73 68   Pulse 75 69      Physical Exam  Gen: Alert, well appearing.  Patient is oriented to person, place, time, and situation. AFFECT: pleasant, lucid thought and speech. CV: RRR, no m/r/g.   LUNGS: CTA bilat, nonlabored resps, good aeration in all lung fields. EXT: no clubbing or  cyanosis.  no edema.    LABS:  Last CBC Lab Results  Component Value Date   WBC 4.9 09/22/2022   HGB 13.7 09/22/2022   HCT 41.5 09/22/2022   MCV 89.6 09/22/2022   MCH 28.2 08/07/2021   RDW 14.9 09/22/2022   PLT 175.0 09/22/2022   Last metabolic panel Lab Results  Component Value Date   GLUCOSE 111 (H) 01/22/2023   NA 143 01/22/2023   K 4.1 01/22/2023   CL 108 01/22/2023   CO2 27 01/22/2023   BUN 22 01/22/2023   CREATININE 1.68 (H) 01/22/2023   GFRNONAA 47 12/03/2021   CALCIUM 8.8 01/22/2023   PHOS 4.7 (H) 12/05/2010   PROT 5.9 (L) 01/22/2023   ALBUMIN 3.7 01/22/2023   BILITOT 0.3 01/22/2023   ALKPHOS 68 01/22/2023   AST 15 01/22/2023   ALT 11 01/22/2023   ANIONGAP 6 08/07/2021   Last lipids Lab Results  Component Value Date   CHOL 136 09/22/2022   HDL 37.60 (L) 09/22/2022   LDLCALC 69 09/22/2022   TRIG 145.0 09/22/2022   CHOLHDL 4 09/22/2022   Last hemoglobin A1c Lab Results  Component Value Date   HGBA1C 5.5 09/18/2021   HGBA1C 5.5 09/18/2021   HGBA1C 5.5 (A) 09/18/2021   HGBA1C 5.5 09/18/2021   Last thyroid functions Lab Results  Component Value Date   TSH 0.93 01/22/2023   IMPRESSION AND PLAN:  1 hypertension, well-controlled on carvedilol 12.5 mg tabs, one half tab twice daily.   2 hypothyroidism. TSHs have been normal on 25 mcg of levothyroxine daily. TSH today.   3.  Chronic renal insufficiency stage III. Avoid NSAIDs and hydrate well. Electrolytes and creatinine today.   4.  Aspiration with liquids.  Progressive problem. DG esophagram 01/2023 showed overall functional oropharyngeal swallow ability.  He does seem to do better if he takes his pills with applesauce.  #5 hypercholesterolemia. Doing well on simvastatin 20 mg a day. Last LDL in September 2023 was 69. Recheck lipids in 3 mo.  An After Visit Summary was printed and given to the patient.  FOLLOW UP: Return in about 4 months (around 09/26/2023) for annual CPE (fasting). Next  cpe Sept/Oct 2024 Signed:  Santiago Bumpers, MD  05/26/2023  

## 2023-05-27 LAB — BASIC METABOLIC PANEL
BUN/Creatinine Ratio: 15 (calc) (ref 6–22)
BUN: 25 mg/dL (ref 7–25)
CO2: 21 mmol/L (ref 20–32)
Calcium: 9.1 mg/dL (ref 8.6–10.3)
Chloride: 107 mmol/L (ref 98–110)
Creat: 1.7 mg/dL — ABNORMAL HIGH (ref 0.70–1.22)
Glucose, Bld: 115 mg/dL — ABNORMAL HIGH (ref 65–99)
Potassium: 4.1 mmol/L (ref 3.5–5.3)
Sodium: 140 mmol/L (ref 135–146)

## 2023-05-27 LAB — TSH: TSH: 0.72 mIU/L (ref 0.40–4.50)

## 2023-06-02 DIAGNOSIS — R3912 Poor urinary stream: Secondary | ICD-10-CM | POA: Diagnosis not present

## 2023-06-02 DIAGNOSIS — N500 Atrophy of testis: Secondary | ICD-10-CM | POA: Diagnosis not present

## 2023-06-02 DIAGNOSIS — R972 Elevated prostate specific antigen [PSA]: Secondary | ICD-10-CM | POA: Diagnosis not present

## 2023-06-02 DIAGNOSIS — N401 Enlarged prostate with lower urinary tract symptoms: Secondary | ICD-10-CM | POA: Diagnosis not present

## 2023-06-09 ENCOUNTER — Ambulatory Visit (INDEPENDENT_AMBULATORY_CARE_PROVIDER_SITE_OTHER): Payer: Medicare HMO

## 2023-06-09 VITALS — Wt 187.0 lb

## 2023-06-09 DIAGNOSIS — Z0000 Encounter for general adult medical examination without abnormal findings: Secondary | ICD-10-CM

## 2023-06-09 NOTE — Progress Notes (Signed)
I connected with  Ophelia Shoulder on 06/09/23 by a audio enabled telemedicine application and verified that I am speaking with the correct person using two identifiers.  Patient Location: Home  Provider Location: Home Office  I discussed the limitations of evaluation and management by telemedicine. The patient expressed understanding and agreed to proceed.   Subjective:   Curtis Jimenez is a 87 y.o. male who presents for Medicare Annual/Subsequent preventive examination.  Review of Systems     Cardiac Risk Factors include: advanced age (>19men, >48 women);dyslipidemia;male gender;hypertension     Objective:    Today's Vitals   06/09/23 1436  Weight: 187 lb (84.8 kg)   Body mass index is 27.02 kg/m.     06/09/2023    2:41 PM 06/03/2022    2:50 PM 10/21/2021    6:10 AM 08/18/2021   10:55 AM 08/07/2021    1:20 PM 05/28/2021    2:55 PM 07/29/2020   11:43 AM  Advanced Directives  Does Patient Have a Medical Advance Directive? Yes Yes Yes Yes Yes Yes No  Type of Estate agent of [place];Living will Healthcare Power of [employer] of [place];Living will Healthcare Power of [place];Living will Healthcare Power of [place];Living will Healthcare Power of [place];Living will   Does patient want to make changes to medical advance directive? No - Patient declined  No - Patient declined No - Patient declined     Copy of Healthcare Power of Attorney in Chart? Yes - validated most recent copy scanned in chart (See row information) Yes - validated most recent copy scanned in chart (See row information)    Yes - validated most recent copy scanned in chart (See row information)   Would patient like information on creating a medical advance directive?       No - Patient declined    Current Medications (verified) Outpatient Encounter Medications as of 06/09/2023  Medication Sig   acetaminophen (TYLENOL) 325 MG tablet Take 650 mg by mouth every 6 (six) hours  as needed for mild pain.   allopurinol (ZYLOPRIM) 300 MG tablet Take 1 tablet (300 mg total) by mouth daily.   carvedilol (COREG) 12.5 MG tablet TAKE 1/2 (ONE-HALF) TABLET BY MOUTH IN THE MORNING AND 1/2 TABLET IN THE EVENING   clopidogrel (PLAVIX) 75 MG tablet Take 1 tablet (75 mg total) by mouth daily.   hydrOXYzine (ATARAX/VISTARIL) 25 MG tablet Take 50 mg by mouth at bedtime.   levothyroxine (SYNTHROID) 25 MCG tablet Take 1 tablet (25 mcg total) by mouth daily.   mirtazapine (REMERON) 15 MG tablet Take 1 tablet (15 mg total) by mouth at bedtime.   Multiple Vitamin (MULTIVITAMIN WITH MINERALS) TABS tablet Take 1 tablet by mouth at bedtime.   mycophenolate (MYFORTIC) 180 MG EC tablet Take 180 mg by mouth 2 (two) times daily.   Omega-3 Fatty Acids (FISH OIL) 1000 MG CAPS Take 2,000 mg by mouth in the morning and at bedtime.   pantoprazole (PROTONIX) 40 MG tablet Take 1 tablet by mouth twice daily (Patient taking differently: Take 40 mg by mouth daily.)   risperiDONE (RISPERDAL) 1 MG tablet Take 2 mg by mouth at bedtime. Half of a 2mg  tab   simvastatin (ZOCOR) 20 MG tablet Take 1 tablet (20 mg total) by mouth daily.   SPIKEVAX syringe    sulfamethoxazole-trimethoprim (BACTRIM,SEPTRA) 400-80 MG tablet Take 1 tablet by mouth every Monday, Wednesday, and Friday. In the morning   tacrolimus (PROGRAF) 0.5 MG capsule Take 0.5-1  mg by mouth See admin instructions. Take 2 capsules (1 mg) by mouth in the morning & take 1 capsule (0.5 mg) by mouth at night.   amoxicillin (AMOXIL) 500 MG capsule Take 500 mg by mouth as needed (for dental).   No facility-administered encounter medications on file as of 06/09/2023.    Allergies (verified) Amantadine hcl, Chlorpromazine hcl, and Grapefruit flavor [flavoring agent]   History: Past Medical History:  Diagnosis Date   Basal cell carcinoma    neck (skin MD 2X per year)   Bifascicular block 2021   ectopy, asymptomatic.  Cards->observe   Bipolar disorder  (HCC)    BPH (benign prostatic hyperplasia)    with hx of elevated PSA; followed by Annabell [facility] as of 2024   Chronic renal insufficiency, stage III (moderate) (HCC)    in pt w/hx of renal transplant.  Post-transplant sCr 1.4-1.6.   Coronary atherosclerosis    LM and 3 V dz noted on CT 08/2019 performed for eval of interstitial lung dz in the setting of mild DOE and mild hypoxia. Cards->no stress tesing indicated->primary prevention emphasized   CVA (cerebral vascular accident) (HCC) 11/2016   Pontine (vertebrobasilar--imaging neg), TPA given.  Pt discharged on plavix.  Carotid dopplers ok, echocardiogram ok.  Residual deficit: vertigo but this improved greatly with therapy.   DOE (dyspnea on exertion) 2020   DOE and ? hypoxia->CXR 08/29/19--->diffuse interstitial opacities-? acute inflammatory process->Dr. Sherene Sires eval'd him and was underwhelmed by the CXR and exam->CT chest 09/26/19 "Very subtle areas of mild ground-glass attenuatio-nonspecific", mild air trapping. Per Dr. Sherene Sires, no signif ILD, improving with inc ambulation (post-inflamm pulm fibrosis?)   Elevated PSA    12/2022, mild, referred to urology   Esophagitis 06/14/2020   EGD->+distal esophagitis, h pylori neg, mild distal esoph stenosis widened with forceps, benign gastric polyps.   GERD (gastroesophageal reflux disease)    Gout    History of adenomatous polyp of colon 2018   Recall 5 yrs   Hyperlipidemia    Hypertension    Hypothyroidism    Lumbar spondylosis    Ptosis due to aging    Right   Rectal bleeding 09/2017   Admitted for obs due to Hb drop.  GI consulted---obs recommended.  No transfusion required.  Plavix was d/c'd, ferrous sulfate recommended.  Outpt GI f/u--plavix restarted and upper endoscopy was normal and colonoscopy showed 2 polyps and severe diverticular dz. Iron d/c'd 04/24/19.   Renal cyst    per pt he got MRI kidneys at the [facility] 12/2020 and the images will be viewed by Dr. Lacy Duverney   Restless  legs syndrome    S/p cadaver renal transplant 2013   Secondary to HTN +lithium toxicity over 30 yrs caused kidney destruction [facility] transplant MDs)   Seborrheic dermatitis    eyebrows worst: Hytone rx'd by Dr. Jorja Loa.   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   left jawline(CX35FU), Left forehead (CX35FU) right shoulder (CX35FU), R forearm   Swallowing dysfunction    thin liquids (swallowing eval 01/2023)   Past Surgical History:  Procedure Laterality Date   AV FISTULA PLACEMENT  04/08/2012   Procedure: ARTERIOVENOUS (AV) FISTULA CREATION;  Surgeon: Chuck Hint, MD;  Location: [facility] OR;  Service: Vascular;  Laterality: Left;  Creation of left brachial cephalic arteriovenous fistula   BIOPSY  06/14/2020   Procedure: BIOPSY;  Surgeon: Shellia Cleverly, DO;  Location: [facility];  Service: Gastroenterology;;   CATARACT EXTRACTION, BILATERAL  05/2018  CHOLECYSTECTOMY N/A 08/18/2021   Procedure: LAPAROSCOPIC CHOLECYSTECTOMY WITH CHOLANGIOGRAM;  Surgeon: Darnell Level, MD;  Location: [facility] ORS;  Service: General;  Laterality: N/A;   COLONOSCOPY  2013; 11/24/17   2013; Normal except diverticulosis (recall 10 yrs).  2018 (for GI bleeding)--severe diverticular dz + 2 adenomatous polyps.  Recall 5 yrs.   DG CHEST AP OR PA ([facility] HX)  06/03/2020   Bibasilar atelectasis. No focal consoliadation definitively identified.   dilation for GERD     ESOPHAGOGASTRODUODENOSCOPY  11/24/2017   gastric polyps x 2, otherwise normal.   ESOPHAGOGASTRODUODENOSCOPY N/A 06/14/2020   +distal esophagitis, h pylori neg, mild distal esoph stenosis widened with forceps, benign gastric polypsProcedure: ESOPHAGOGASTRODUODENOSCOPY (EGD);  Surgeon: Shellia Cleverly, DO;  Location: [facility];  Service: Gastroenterology;  Laterality: N/A;  Diagnostic EGD as patietn last took Plavix 11am on 06/12/2020   KIDNEY TRANSPLANT  11/06/12   [facility]   LIGATION OF ARTERIOVENOUS  FISTULA Left 10/21/2021    Procedure: LIGATION OF LEFT ARTERIOVENOUS  FISTULA;  Surgeon: Chuck Hint, MD;  Location: [facility] OR;  Service: Vascular;  Laterality: Left;   PROSTATE BIOPSY  2011   no malignancy   TRANSTHORACIC ECHOCARDIOGRAM  11/2016   Normal LV systolic fxn, EF 16-10%.  Grade I DD.  Mild aortic root dilatation, mild MV regurg.   Family History  Problem Relation Age of Onset   Ulcers Mother        GI bleed    Heart disease Father    Bladder Cancer Father    Colon cancer Neg Hx    Esophageal cancer Neg Hx    Pancreatic cancer Neg Hx    Rectal cancer Neg Hx    Stomach cancer Neg Hx    Social History   Socioeconomic History   Marital status: Married    Spouse name: Not on file   Number of children: Not on file   Years of education: Not on file   Highest education level: Not on file  Occupational History   Not on file  Tobacco Use   Smoking status: Former    Types: Pipe, Cigars    Quit date: 05/11/1974    Years since quitting: 49.1   Smokeless tobacco: Never  Vaping Use   Vaping Use: Never used  Substance and Sexual Activity   Alcohol use: No   Drug use: No   Sexual activity: Not Currently    Partners: Female  Other Topics Concern   Not on file  Social History Narrative   Married, 4 children, 9 GGC, 2GGC.   Occupation: retired Dance movement psychotherapist.  Originally from [place].   Tobacco: quit 1975; smoked pipes and cigars x 15 yrs prior to this.   Alcohol: none.   Exercise: minimal, but is going to sign up for silver sneakers at the [facility].   Social Determinants of Health   Financial Resource Strain: Low Risk  (06/09/2023)   Overall Financial Resource Strain (CARDIA)    Difficulty of Paying Living Expenses: Not hard at all  Food Insecurity: No Food Insecurity (06/09/2023)   Hunger Vital Sign    Worried About Running Out of Food in the Last Year: Never true    Ran Out of Food in the Last Year: Never true  Transportation Needs: No Transportation Needs (06/09/2023)   PRAPARE -  Administrator, Civil Service (Medical): No    Lack of Transportation (Non-Medical): No  Physical Activity: Insufficiently Active (06/09/2023)   Exercise Vital Sign  Days of Exercise per Week: 2 days    Minutes of Exercise per Session: 60 min  Stress: No Stress Concern Present (06/09/2023)   [employer] of Occupational Health - Occupational Stress Questionnaire    Feeling of Stress : Not at all  Social Connections: Socially Integrated (06/09/2023)   Social Connection and Isolation Panel [NHANES]    Frequency of Communication with Friends and Family: Twice a week    Frequency of Social Gatherings with Friends and Family: More than three times a week    Attends Religious Services: More than 4 times per year    Active Member of [employer] or Organizations: Yes    Attends Banker Meetings: 1 to 4 times per year    Marital Status: Married    Tobacco Counseling Counseling given: Not Answered   Clinical Intake:  Pre-visit preparation completed: Yes  Pain : No/denies pain     BMI - recorded: 27.02 Nutritional Status: BMI 25 -29 Overweight Diabetes: No  How often do you need to have someone help you when you read instructions, pamphlets, or other written materials from your doctor or pharmacy?: 1 - Never  Diabetic?no  Interpreter Needed?: No  Information entered by :: Lanier Ensign, LPN   Activities of Daily Living    06/09/2023    2:41 PM  In your present state of health, do you have any difficulty performing the following activities:  Hearing? 0  Vision? 0  Difficulty concentrating or making decisions? 0  Walking or climbing stairs? 0  Dressing or bathing? 0  Doing errands, shopping? 0  Preparing Food and eating ? N  Using the Toilet? N  In the past six months, have you accidently leaked urine? N  Do you have problems with loss of bowel control? N  Managing your Medications? N  Managing your Finances? N  Housekeeping or managing your  Housekeeping? N    Patient Care Team: Jeoffrey Massed, MD as PCP - General (Family Medicine) Cottle, Steva Ready., MD as Consulting Physician (Psychiatry) Chuck Hint, MD as Consulting Physician (Vascular Surgery) Loletha Carrow, MD as Consulting Physician (Ophthalmology) Drema Dallas, DO as Consulting Physician (Neurology) Napoleon Form, MD as Consulting Physician (Gastroenterology) Annie Sable, MD as Consulting Physician (Nephrology) Janalyn Harder, MD (Inactive) as Consulting Physician (Dermatology) Nyoka Cowden, MD as Consulting Physician (Pulmonary Disease) Rollene Rotunda, MD as Consulting Physician (Cardiology) Darnell Level, MD as Consulting Physician (General Surgery) Bjorn Pippin, MD as Consulting Physician (Urology)  Indicate any recent Medical Services you may have received from other than [facility] providers in the past year (date may be approximate).     Assessment:   This is a routine wellness examination for [place].  Hearing/Vision screen Hearing Screening - Comments:: Pt has hearing aids  Vision Screening - Comments:: Pt follows up with Dr Burgess Estelle for annual eye exams   Dietary issues and exercise activities discussed: Current Exercise Habits: Home exercise routine, Time (Minutes): 60, Frequency (Times/Week): 2, Weekly Exercise (Minutes/Week): 120   Goals Addressed             This Visit's Progress    Patient Stated       Stay healthy        Depression Screen    06/09/2023    2:39 PM 05/26/2023    9:52 AM 01/22/2023    9:39 AM 06/03/2022    2:49 PM 05/20/2022   10:01 AM 05/28/2021    2:56 PM 06/25/2020    6:28  PM  PHQ 2/9 Scores  PHQ - 2 Score 0 0 0 0 0 0 0  PHQ- 9 Score 0 0         Fall Risk    06/09/2023    2:41 PM 05/26/2023    9:52 AM 01/22/2023    9:39 AM 06/03/2022    2:51 PM 05/20/2022   10:01 AM  Fall Risk   Falls in the past year? 0 0 0 0 0  Number falls in past yr: 0  1 0 0  Injury with Fall? 0  0 0 0  Risk for  fall due to : Impaired mobility;Impaired vision;Impaired balance/gait  No Fall Risks Impaired balance/gait;Impaired mobility Impaired balance/gait  Follow up Falls prevention discussed  Falls evaluation completed Falls prevention discussed Falls evaluation completed    FALL RISK PREVENTION PERTAINING TO THE HOME:  Any stairs in or around the home? No  If so, are there any without handrails? No  Home free of loose throw rugs in walkways, pet beds, electrical cords, etc? Yes  Adequate lighting in your home to reduce risk of falls? Yes   ASSISTIVE DEVICES UTILIZED TO PREVENT FALLS:  Life alert? No  Use of a cane, walker or w/c? No  Grab bars in the bathroom? Yes  Shower chair or bench in shower? Yes  Elevated toilet seat or a handicapped toilet? No   TIMED UP AND GO:  Was the test performed? No .   Cognitive Function:        06/09/2023    2:42 PM 06/03/2022    2:53 PM  6CIT Screen  What Year? 0 points 0 points  What month? 0 points 0 points  What time? 0 points 0 points  Count back from 20 0 points 2 points  Months in reverse 0 points 0 points  Repeat phrase 0 points 0 points  Total Score 0 points 2 points    Immunizations Immunization History  Administered Date(s) Administered   Fluad Quad(high Dose 65+) 09/15/2019, 09/26/2020, 10/26/2020, 09/30/2021, 09/22/2022   Hepatitis A, Adult 08/14/2016, 05/31/2017   Hepatitis B 02/15/2012, 03/14/2012, 08/15/2012   Influenza Split 09/27/2012   Influenza Whole 12/16/2009, 09/18/2010, 09/28/2011   Influenza, High Dose Seasonal PF 11/04/2016, 11/05/2017, 09/13/2018   Influenza-Unspecified 09/27/2013   PFIZER(Purple Top)SARS-COV-2 Vaccination 01/08/2020, 02/01/2020, 08/17/2020, 04/01/2021   PPD Test 06/27/2012   Pfizer Covid-19 Vaccine Bivalent Booster 34yrs & up 12/10/2021, 04/22/2022   Pneumococcal Conjugate-13 07/19/2015, 09/26/2020   Pneumococcal Polysaccharide-23 12/29/2003   Pneumococcal-Unspecified 07/06/2002   Respiratory  Syncytial Virus Vaccine,Recomb Aduvanted(Arexvy) 10/28/2022   Td 12/29/2003   Tdap 09/27/2012, 04/27/2014   Typhoid Inactivated 08/14/2016, 10/08/2022   Zoster Recombinat (Shingrix) 05/24/2021, 09/15/2021   Zoster, Live 04/18/2008    TDAP status: Up to date  Flu Vaccine status: Up to date  Pneumococcal vaccine status: Up to date  Covid-19 vaccine status: Completed vaccines  Qualifies for Shingles Vaccine? Yes   Zostavax completed Yes   Shingrix Completed?: Yes  Screening Tests Health Maintenance  Topic Date Due   COVID-19 Vaccine (7 - 2023-24 season) 08/28/2022   INFLUENZA VACCINE  07/29/2023   DTaP/Tdap/Td (4 - Td or Tdap) 04/27/2024   Medicare Annual Wellness (AWV)  06/08/2024   Pneumonia Vaccine 82+ Years old  Completed   Zoster Vaccines- Shingrix  Completed   HPV VACCINES  Aged Out    Health Maintenance  Health Maintenance Due  Topic Date Due   COVID-19 Vaccine (7 - 2023-24 season) 08/28/2022    Colorectal  cancer screening: No longer required.   Additional Screening:  Vision Screening: Recommended annual ophthalmology exams for early detection of glaucoma and other disorders of the eye. Is the patient up to date with their annual eye exam?  Yes  Who is the provider or what is the name of the office in which the patient attends annual eye exams? Dr Burgess Estelle  If pt is not established with a provider, would they like to be referred to a provider to establish care? No .   Dental Screening: Recommended annual dental exams for proper oral hygiene  Community Resource Referral / Chronic Care Management: CRR required this visit?  No   CCM required this visit?  No      Plan:     I have personally reviewed and noted the following in the patient's chart:   Medical and social history Use of alcohol, tobacco or illicit drugs  Current medications and supplements including opioid prescriptions. Patient is not currently taking opioid prescriptions. Functional ability  and status Nutritional status Physical activity Advanced directives List of other physicians Hospitalizations, surgeries, and ER visits in previous 12 months Vitals Screenings to include cognitive, depression, and falls Referrals and appointments  In addition, I have reviewed and discussed with patient certain preventive protocols, quality metrics, and best practice recommendations. A written personalized care plan for preventive services as well as general preventive health recommendations were provided to patient.     Marzella Schlein, LPN   01/27/8656   Nurse Notes: none

## 2023-06-09 NOTE — Patient Instructions (Signed)
[name] , Thank you for taking time to come for your Medicare Wellness Visit. I appreciate your ongoing commitment to your health goals. Please review the following plan we discussed and let me know if I can assist you in the future.   These are the goals we discussed:  Goals      Patient Stated     Stay healthy      Patient Stated     Stay healthy      Weight (lb) < 198 lb (89.8 kg)     Lose weight by controlling diet.         This is a list of the screening recommended for you and due dates:  Health Maintenance  Topic Date Due   COVID-19 Vaccine (7 - 2023-24 season) 08/28/2022   Flu Shot  07/29/2023   DTaP/Tdap/Td vaccine (4 - Td or Tdap) 04/27/2024   Medicare Annual Wellness Visit  06/08/2024   Pneumonia Vaccine  Completed   Zoster (Shingles) Vaccine  Completed   HPV Vaccine  Aged Out    Advanced directives: Please bring a copy of your health care power of attorney and living will to the office at your convenience.  Conditions/risks identified: stay healthy   Next appointment: Follow up in one year for your annual wellness visit.   Preventive Care 10 Years and Older, Male  Preventive care refers to lifestyle choices and visits with your health care provider that can promote health and wellness. What does preventive care include? A yearly physical exam. This is also called an annual well check. Dental exams once or twice a year. Routine eye exams. Ask your health care provider how often you should have your eyes checked. Personal lifestyle choices, including: Daily care of your teeth and gums. Regular physical activity. Eating a healthy diet. Avoiding tobacco and drug use. Limiting alcohol use. Practicing safe sex. Taking low doses of aspirin every day. Taking vitamin and mineral supplements as recommended by your health care provider. What happens during an annual well check? The services and screenings done by your health care provider during your annual  well check will depend on your age, overall health, lifestyle risk factors, and family history of disease. Counseling  Your health care provider may ask you questions about your: Alcohol use. Tobacco use. Drug use. Emotional well-being. Home and relationship well-being. Sexual activity. Eating habits. History of falls. Memory and ability to understand (cognition). Work and work Astronomer. Screening  You may have the following tests or measurements: Height, weight, and BMI. Blood pressure. Lipid and cholesterol levels. These may be checked every 5 years, or more frequently if you are over 44 years old. Skin check. Lung cancer screening. You may have this screening every year starting at age 24 if you have a 30-pack-year history of smoking and currently smoke or have quit within the past 15 years. Fecal occult blood test (FOBT) of the stool. You may have this test every year starting at age 70. Flexible sigmoidoscopy or colonoscopy. You may have a sigmoidoscopy every 5 years or a colonoscopy every 10 years starting at age 71. Prostate cancer screening. Recommendations will vary depending on your family history and other risks. Hepatitis C blood test. Hepatitis B blood test. Sexually transmitted disease (STD) testing. Diabetes screening. This is done by checking your blood sugar (glucose) after you have not eaten for a while (fasting). You may have this done every 1-3 years. Abdominal aortic aneurysm (AAA) screening. You may need this if you  are a current or former smoker. Osteoporosis. You may be screened starting at age 51 if you are at high risk. Talk with your health care provider about your test results, treatment options, and if necessary, the need for more tests. Vaccines  Your health care provider may recommend certain vaccines, such as: Influenza vaccine. This is recommended every year. Tetanus, diphtheria, and acellular pertussis (Tdap, Td) vaccine. You may need a Td booster  every 10 years. Zoster vaccine. You may need this after age 85. Pneumococcal 13-valent conjugate (PCV13) vaccine. One dose is recommended after age 65. Pneumococcal polysaccharide (PPSV23) vaccine. One dose is recommended after age 49. Talk to your health care provider about which screenings and vaccines you need and how often you need them. This information is not intended to replace advice given to you by your health care provider. Make sure you discuss any questions you have with your health care provider. Document Released: 01/10/2016 Document Revised: 09/02/2016 Document Reviewed: 10/15/2015 Elsevier Interactive Patient Education  2017 [place] Prevention in the Home Falls can cause injuries. They can happen to people of all ages. There are many things you can do to make your home safe and to help prevent falls. What can I do on the outside of my home? Regularly fix the edges of walkways and driveways and fix any cracks. Remove anything that might make you trip as you walk through a door, such as a raised step or threshold. Trim any bushes or trees on the path to your home. Use bright outdoor lighting. Clear any walking paths of anything that might make someone trip, such as rocks or tools. Regularly check to see if handrails are loose or broken. Make sure that both sides of any steps have handrails. Any raised decks and porches should have guardrails on the edges. Have any leaves, snow, or ice cleared regularly. Use sand or salt on walking paths during winter. Clean up any spills in your garage right away. This includes oil or grease spills. What can I do in the bathroom? Use night lights. Install grab bars by the toilet and in the tub and shower. Do not use towel bars as grab bars. Use non-skid mats or decals in the tub or shower. If you need to sit down in the shower, use a plastic, non-slip stool. Keep the floor dry. Clean up any water that spills on the floor as soon  as it happens. Remove soap buildup in the tub or shower regularly. Attach bath mats securely with double-sided non-slip rug tape. Do not have throw rugs and other things on the floor that can make you trip. What can I do in the bedroom? Use night lights. Make sure that you have a light by your bed that is easy to reach. Do not use any sheets or blankets that are too big for your bed. They should not hang down onto the floor. Have a firm chair that has side arms. You can use this for support while you get dressed. Do not have throw rugs and other things on the floor that can make you trip. What can I do in the kitchen? Clean up any spills right away. Avoid walking on wet floors. Keep items that you use a lot in easy-to-reach places. If you need to reach something above you, use a strong step stool that has a grab bar. Keep electrical cords out of the way. Do not use floor polish or wax that makes floors slippery. If you  must use wax, use non-skid floor wax. Do not have throw rugs and other things on the floor that can make you trip. What can I do with my stairs? Do not leave any items on the stairs. Make sure that there are handrails on both sides of the stairs and use them. Fix handrails that are broken or loose. Make sure that handrails are as long as the stairways. Check any carpeting to make sure that it is firmly attached to the stairs. Fix any carpet that is loose or worn. Avoid having throw rugs at the top or bottom of the stairs. If you do have throw rugs, attach them to the floor with carpet tape. Make sure that you have a light switch at the top of the stairs and the bottom of the stairs. If you do not have them, ask someone to add them for you. What else can I do to help prevent falls? Wear shoes that: Do not have high heels. Have rubber bottoms. Are comfortable and fit you well. Are closed at the toe. Do not wear sandals. If you use a stepladder: Make sure that it is fully  opened. Do not climb a closed stepladder. Make sure that both sides of the stepladder are locked into place. Ask someone to hold it for you, if possible. Clearly mark and make sure that you can see: Any grab bars or handrails. First and last steps. Where the edge of each step is. Use tools that help you move around (mobility aids) if they are needed. These include: Canes. Walkers. Scooters. Crutches. Turn on the lights when you go into a dark area. Replace any light bulbs as soon as they burn out. Set up your furniture so you have a clear path. Avoid moving your furniture around. If any of your floors are uneven, fix them. If there are any pets around you, be aware of where they are. Review your medicines with your doctor. Some medicines can make you feel dizzy. This can increase your chance of falling. Ask your doctor what other things that you can do to help prevent falls. This information is not intended to replace advice given to you by your health care provider. Make sure you discuss any questions you have with your health care provider. Document Released: 10/10/2009 Document Revised: 05/21/2016 Document Reviewed: 01/18/2015 Elsevier Interactive Patient Education  2017 [employer].

## 2023-06-22 LAB — AMB RESULTS CONSOLE CBG: Glucose: 118

## 2023-06-28 NOTE — Progress Notes (Signed)
No new SDOH needs since 06/09/23 

## 2023-07-02 DIAGNOSIS — N183 Chronic kidney disease, stage 3 unspecified: Secondary | ICD-10-CM | POA: Diagnosis not present

## 2023-07-16 ENCOUNTER — Ambulatory Visit (INDEPENDENT_AMBULATORY_CARE_PROVIDER_SITE_OTHER): Payer: Medicare HMO | Admitting: Gastroenterology

## 2023-07-16 ENCOUNTER — Encounter: Payer: Self-pay | Admitting: Gastroenterology

## 2023-07-16 VITALS — BP 120/72 | HR 86 | Ht 69.0 in | Wt 187.0 lb

## 2023-07-16 DIAGNOSIS — Z8673 Personal history of transient ischemic attack (TIA), and cerebral infarction without residual deficits: Secondary | ICD-10-CM | POA: Diagnosis not present

## 2023-07-16 DIAGNOSIS — Z86010 Personal history of colonic polyps: Secondary | ICD-10-CM

## 2023-07-16 NOTE — Patient Instructions (Addendum)
You have been scheduled for a CT virtual colonoscopy at [facility] ( [address] location). Your appointment is scheduled for 08/11/23 at 900 AM. Arrival time - 8:40 AM. Please go to [facility] at least 4 days prior to your exam to get your prep and instructions. If you need to reschedule your appointment or have specific questions regarding this test, please contact [facility] at [phone].   Due to recent changes in healthcare laws, you may see the results of your imaging and laboratory studies on MyChart before your provider has had a chance to review them.  We understand that in some cases there may be results that are confusing or concerning to you. Not all laboratory results come back in the same time frame and the provider may be waiting for multiple results in order to interpret others.  Please give [place] 48 hours in order for your provider to thoroughly review all the results before contacting the office for clarification of your results.   Follow up as needed. _______________________________________________________  If your blood pressure at your visit was 140/90 or greater, please contact your primary care physician to follow up on this.  _______________________________________________________  If you are age 85 or older, your body mass index should be between 23-30. Your Body mass index is 27.62 kg/m. If this is out of the aforementioned range listed, please consider follow up with your Primary Care Provider. __________________________________________________________  The [facility] providers would like to encourage you to use [facility] to communicate with providers for non-urgent requests or questions.  Due to long hold times on the telephone, sending your provider a message by [facility] may be a faster and more efficient way to get a response.  Please allow 48 business hours for a response.  Please remember that this is for non-urgent requests.    Thank  you for choosing me and [facility].  Bayley McMichael, PA-C.

## 2023-07-16 NOTE — Progress Notes (Signed)
Chief Complaint: wants a colonoscopy Primary GI MD: Dr. Lavon Paganini  HPI: 87 year old male with history of hypertension, stroke (on Plavix), CKD, and other listed below presents for evaluation of repeat colonoscopy  Last seen 05/2021 by Doug Sou PA-C for intermittent right sided abdominal pain with associated nausea and vomiting. Found to have cholecystitis and underwent cholecystectomy  Patient states he is here because he is concerned he has colon cancer. He denies abdominal pain, nausea, and vomiting, rectal bleeding, melena, and weight loss. Denies family history of colon cancer.  Patient is fearful he has colon cancer and would like a CT scan. Last colonoscopy in 2018 showed two tubular adenomas.  PREVIOUS GI WORKUP   06/14/2020 EGD with Dr. Barron Alvine with LA grade a reflux esophagitis, benign-appearing esophageal stenosis which was fractured with cold forceps given the patient was still on anticoagulation, ectopic gastric mucosa in the upper third of the esophagus, multiple gastric polyps and otherwise normal.   Colonoscopy 10/2017:  - Severe diverticulosis in the entire examined colon. There was narrowing of the colon in association with the diverticular opening. There was evidence of diverticular spasm. Peri- diverticular erythema was seen. There was evidence of an impacted diverticulum. There was no evidence of diverticular bleeding.  - One 3 mm tubular adenoma in the ascending colon, removed with a cold biopsy forceps. Resected and retrieved.  - One 7 mm tubular adenoma in the ascending colon, removed with a cold snare. Resected and retrieved.  - Non- bleeding internal hemorrhoids.  Past Medical History:  Diagnosis Date   Basal cell carcinoma    neck (skin MD 2X per year)   Bifascicular block 2021   ectopy, asymptomatic.  Cards->observe   Bipolar disorder (HCC)    BPH (benign prostatic hyperplasia)    with hx of elevated PSA; followed by Annabell Howells, [facility] as of 2024    Chronic renal insufficiency, stage III (moderate) (HCC)    in pt w/hx of renal transplant.  Post-transplant sCr 1.4-1.6.   Coronary atherosclerosis    LM and 3 V dz noted on CT 08/2019 performed for eval of interstitial lung dz in the setting of mild DOE and mild hypoxia. Cards->no stress tesing indicated->primary prevention emphasized   CVA (cerebral vascular accident) (HCC) 11/2016   Pontine (vertebrobasilar--imaging neg), TPA given.  Pt discharged on plavix.  Carotid dopplers ok, echocardiogram ok.  Residual deficit: vertigo but this improved greatly with therapy.   DOE (dyspnea on exertion) 2020   DOE and ? hypoxia->CXR 08/29/19--->diffuse interstitial opacities-? acute inflammatory process->Dr. Sherene Sires eval'd him and was underwhelmed by the CXR and exam->CT chest 09/26/19 "Very subtle areas of mild ground-glass attenuatio-nonspecific", mild air trapping. Per Dr. Sherene Sires, no signif ILD, improving with inc ambulation (post-inflamm pulm fibrosis?)   Elevated PSA    12/2022, mild, referred to urology   Esophagitis 06/14/2020   EGD->+distal esophagitis, h pylori neg, mild distal esoph stenosis widened with forceps, benign gastric polyps.   GERD (gastroesophageal reflux disease)    Gout    History of adenomatous polyp of colon 2018   Recall 5 yrs   Hyperlipidemia    Hypertension    Hypothyroidism    Lumbar spondylosis    Ptosis due to aging    Right   Rectal bleeding 09/2017   Admitted for obs due to Hb drop.  GI consulted---obs recommended.  No transfusion required.  Plavix was d/c'd, ferrous sulfate recommended.  Outpt GI f/u--plavix restarted and upper endoscopy was normal and colonoscopy showed 2  polyps and severe diverticular dz. Iron d/c'd 04/24/19.   Renal cyst    per pt he got MRI kidneys at [facility] 12/2020 and the images will be viewed by Dr. 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 Jorja Loa.   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   left jawline(CX35FU), Left forehead (CX35FU) right shoulder (CX35FU), R forearm   Swallowing dysfunction    thin liquids (swallowing eval 01/2023)    Past Surgical History:  Procedure Laterality Date   AV FISTULA PLACEMENT  04/08/2012   Procedure: ARTERIOVENOUS (AV) FISTULA CREATION;  Surgeon: Chuck Hint, MD;  Location: [facility] OR;  Service: Vascular;  Laterality: Left;  Creation of left brachial cephalic arteriovenous fistula   BIOPSY  06/14/2020   Procedure: BIOPSY;  Surgeon: Shellia Cleverly, DO;  Location: [facility];  Service: Gastroenterology;;   CATARACT EXTRACTION, BILATERAL  05/2018   CHOLECYSTECTOMY N/A 08/18/2021   Procedure: LAPAROSCOPIC CHOLECYSTECTOMY WITH CHOLANGIOGRAM;  Surgeon: Darnell Level, MD;  Location: [facility] ORS;  Service: General;  Laterality: N/A;   COLONOSCOPY  2013; 11/24/17   2013; Normal except diverticulosis (recall 10 yrs).  2018 (for GI bleeding)--severe diverticular dz + 2 adenomatous polyps.  Recall 5 yrs.   DG CHEST AP OR PA ([facility] HX)  06/03/2020   Bibasilar atelectasis. No focal consoliadation definitively identified.   dilation for GERD     ESOPHAGOGASTRODUODENOSCOPY  11/24/2017   gastric polyps x 2, otherwise normal.   ESOPHAGOGASTRODUODENOSCOPY N/A 06/14/2020   +distal esophagitis, h pylori neg, mild distal esoph stenosis widened with forceps, benign gastric polypsProcedure: ESOPHAGOGASTRODUODENOSCOPY (EGD);  Surgeon: Shellia Cleverly, DO;  Location: [facility];  Service: Gastroenterology;  Laterality: N/A;  Diagnostic EGD as patietn last took Plavix 11am on 06/12/2020   KIDNEY TRANSPLANT  11/06/12   [facility] (cadaveric)   LIGATION OF ARTERIOVENOUS  FISTULA Left 10/21/2021   Procedure: LIGATION OF LEFT ARTERIOVENOUS  FISTULA;  Surgeon: Chuck Hint, MD;  Location: [facility] OR;  Service:  Vascular;  Laterality: Left;   PROSTATE BIOPSY  2011   no malignancy   TRANSTHORACIC ECHOCARDIOGRAM  11/2016   Normal LV systolic fxn, EF 40-98%.  Grade I DD.  Mild aortic root dilatation, mild MV regurg.    Current Outpatient Medications  Medication Sig Dispense Refill   acetaminophen (TYLENOL) 325 MG tablet Take 650 mg by mouth every 6 (six) hours as needed for mild pain.     allopurinol (ZYLOPRIM) 300 MG tablet Take 1 tablet (300 mg total) by mouth daily. 90 tablet 3   amoxicillin (AMOXIL) 500 MG capsule Take 500 mg by mouth as needed (for dental).     carvedilol (COREG) 12.5 MG tablet TAKE 1/2 (ONE-HALF) TABLET BY MOUTH IN THE MORNING AND 1/2 TABLET IN THE EVENING 90 tablet 3   clopidogrel (PLAVIX) 75 MG tablet Take 1 tablet (75 mg total) by mouth daily. 90 tablet 3   hydrOXYzine (ATARAX/VISTARIL) 25 MG tablet Take 50 mg by mouth at bedtime.     levothyroxine (SYNTHROID) 25 MCG tablet Take 1 tablet (25 mcg total) by mouth daily. 90 tablet 1   mirtazapine (REMERON) 15 MG tablet Take 1 tablet (15 mg total) by mouth at bedtime. 90 tablet 3   Multiple Vitamin (MULTIVITAMIN WITH MINERALS) TABS tablet Take 1  tablet by mouth at bedtime.     mycophenolate (MYFORTIC) 180 MG EC tablet Take 180 mg by mouth 2 (two) times daily.     Omega-3 Fatty Acids (FISH OIL) 1000 MG CAPS Take 2,000 mg by mouth in the morning and at bedtime.     pantoprazole (PROTONIX) 40 MG tablet Take 1 tablet by mouth twice daily (Patient taking differently: Take 40 mg by mouth daily.) 180 tablet 0   risperiDONE (RISPERDAL) 1 MG tablet Take 2 mg by mouth at bedtime. Half of a 2mg  tab     simvastatin (ZOCOR) 20 MG tablet Take 1 tablet (20 mg total) by mouth daily. 90 tablet 3   SPIKEVAX syringe      sulfamethoxazole-trimethoprim (BACTRIM,SEPTRA) 400-80 MG tablet Take 1 tablet by mouth every Monday, Wednesday, and Friday. In the morning     tacrolimus (PROGRAF) 0.5 MG capsule Take 0.5-1 mg by mouth See admin instructions. Take 2  capsules (1 mg) by mouth in the morning & take 1 capsule (0.5 mg) by mouth at night.     No current facility-administered medications for this visit.    Allergies as of 07/16/2023 - Review Complete 06/09/2023  Allergen Reaction Noted   Amantadine hcl Itching 04/23/2010   Chlorpromazine hcl Other (See Comments) 04/04/2014   Grapefruit flavor [flavoring agent] Other (See Comments) 08/04/2021    Family History  Problem Relation Age of Onset   Ulcers Mother        GI bleed    Heart disease Father    Bladder Cancer Father    Colon cancer Neg Hx    Esophageal cancer Neg Hx    Pancreatic cancer Neg Hx    Rectal cancer Neg Hx    Stomach cancer Neg Hx     Social History   Socioeconomic History   Marital status: Married    Spouse name: Not on file   Number of children: Not on file   Years of education: Not on file   Highest education level: Not on file  Occupational History   Not on file  Tobacco Use   Smoking status: Former    Types: Pipe, Cigars    Quit date: 05/11/1974    Years since quitting: 49.2   Smokeless tobacco: Never  Vaping Use   Vaping status: Never Used  Substance and Sexual Activity   Alcohol use: No   Drug use: No   Sexual activity: Not Currently    Partners: Female  Other Topics Concern   Not on file  Social History Narrative   Married, 4 children, 9 GGC, 2GGC.   Occupation: retired Dance movement psychotherapist.  Originally from [place].   Tobacco: quit 1975; smoked pipes and cigars x 15 yrs prior to this.   Alcohol: none.   Exercise: minimal, but is going to sign up [facility] silver sneakers at the [facility].   Social Determinants of Health   Financial Resource Strain: Low Risk  (06/09/2023)   Overall Financial Resource Strain (CARDIA)    Difficulty of Paying Living Expenses: Not hard at all  Food Insecurity: No Food Insecurity (06/09/2023)   Hunger Vital Sign    Worried About Running Out of Food in the Last Year: Never true    Ran Out of Food in the Last Year: Never true   Transportation Needs: No Transportation Needs (06/09/2023)   PRAPARE - Administrator, Civil Service (Medical): No    Lack of Transportation (Non-Medical): No  Physical Activity: Insufficiently Active (06/09/2023)   Exercise  Vital Sign    Days of Exercise per Week: 2 days    Minutes of Exercise per Session: 60 min  Stress: No Stress Concern Present (06/09/2023)   [employer] of Occupational Health - Occupational Stress Questionnaire    Feeling of Stress : Not at all  Social Connections: Socially Integrated (06/09/2023)   Social Connection and Isolation Panel [NHANES]    Frequency of Communication with Friends and Family: Twice a week    Frequency of Social Gatherings with Friends and Family: More than three times a week    Attends Religious Services: More than 4 times per year    Active Member of [employer] or Organizations: Yes    Attends Banker Meetings: 1 to 4 times per year    Marital Status: Married  Catering manager Violence: Not At Risk (06/09/2023)   Humiliation, Afraid, Rape, and Kick questionnaire    Fear of Current or Ex-Partner: No    Emotionally Abused: No    Physically Abused: No    Sexually Abused: No    Review of Systems:    Constitutional: No weight loss, fever, chills, weakness or fatigue HEENT: Eyes: No change in vision               Ears, Nose, Throat:  No change in hearing or congestion Skin: No rash or itching Cardiovascular: No chest pain, chest pressure or palpitations   Respiratory: No SOB or cough Gastrointestinal: See HPI and otherwise negative Genitourinary: No dysuria or change in urinary frequency Neurological: No headache, dizziness or syncope Musculoskeletal: No new muscle or joint pain Hematologic: No bleeding or bruising Psychiatric: No history of depression or anxiety    Physical Exam:  Vital signs: There were no vitals taken for this visit.  Constitutional: elderly male, able to walk without difficulty. Head:   Normocephalic and atraumatic. Eyes:   PEERL, EOMI. No icterus. Conjunctiva pink. Respiratory: Respirations even and unlabored. Lungs clear to auscultation bilaterally.   No wheezes, crackles, or rhonchi.  Cardiovascular:  Regular rate and rhythm. No peripheral edema, cyanosis or pallor.  Gastrointestinal:  Soft, nondistended, nontender. No rebound or guarding. Normal bowel sounds. No appreciable masses or hepatomegaly. Rectal:  Not performed.  Msk:  Symmetrical without gross deformities. Without edema, no deformity or joint abnormality.  Neurologic:  Alert and  oriented x4;  grossly normal neurologically.  Skin:   Dry and intact without significant lesions or rashes. Psychiatric: Oriented to person, place and time. Demonstrates good judgement and reason without abnormal affect or behaviors.   RELEVANT LABS AND IMAGING: CBC    Component Value Date/Time   WBC 5.1 05/19/2023 0000   WBC 4.9 09/22/2022 1009   RBC 4.80 05/19/2023 0000   HGB 14.0 05/19/2023 0000   HCT 42 05/19/2023 0000   PLT 207 05/19/2023 0000   MCV 89.6 09/22/2022 1009   MCH 28.2 08/07/2021 1341   MCHC 32.9 09/22/2022 1009   RDW 14.9 09/22/2022 1009   LYMPHSABS 0.8 06/04/2021 0933   MONOABS 0.7 06/04/2021 0933   EOSABS 0.1 06/04/2021 0933   BASOSABS 0.1 06/04/2021 0933    CMP     Component Value Date/Time   NA 140 05/26/2023 1018   NA 139 05/19/2023 0000   K 4.1 05/26/2023 1018   CL 107 05/26/2023 1018   CO2 21 05/26/2023 1018   GLUCOSE 115 (H) 05/26/2023 1018   GLUCOSE 103 (H) 12/15/2006 1045   BUN 25 05/26/2023 1018   BUN 33 (A) 05/19/2023 0000  CREATININE 1.70 (H) 05/26/2023 1018   CALCIUM 9.1 05/26/2023 1018   CALCIUM 9.5 12/06/2010 0046   PROT 5.9 (L) 01/22/2023 1001   ALBUMIN 3.7 05/19/2023 0000   AST 14 05/19/2023 0000   ALT 14 05/19/2023 0000   ALKPHOS 72 05/19/2023 0000   BILITOT 0.3 01/22/2023 1001   GFRNONAA 47 12/03/2021 0000   GFRNONAA 45 (L) 08/07/2021 1341   GFRAA 38 11/28/2020 0000      Assessment/Plan:   History of colonic polyps History of CVA (cerebrovascular accident) Extensive discussion with patient about increased risks of colonoscopy with his age and comorbidities. There is significant increase in risk of perforation and MI/CVA especially since he would have to hold his blood thinner. He also has no symptoms that would indicate a need for colonoscopy. Although for his age he appears in decent health so if problems were to arise he could be a candidate. Discussed how he is not a candidate for cologuard due to hx of polyps. - Virtual colonoscopy for further evaluation. Discussed virtual colonoscopy risk of missing malignancy or not catching polyps under 5mm. - f/u prn   Lara Mulch [facility] 07/16/2023, 12:34 PM  Cc: McGowen, Maryjean Morn, MD

## 2023-07-27 ENCOUNTER — Telehealth: Payer: Self-pay | Admitting: Gastroenterology

## 2023-07-27 NOTE — Telephone Encounter (Signed)
Inbound call from patient wife requesting to know if 8/14 virtual colonoscopy needs prior authorization. Requesting a call back to discuss further. Please advise, thank you.

## 2023-07-27 NOTE — Telephone Encounter (Signed)
Called patient's wife back. LVM that I have asked our Prior Auth team whether patient needs PA for scheduled CT Virtual Colonoscopy or not. I will call her with an answer once once I have information.

## 2023-07-28 NOTE — Telephone Encounter (Signed)
LVM to the patient that Prior Authorization is already approved.  Per Ernst Bowler, PA # [phone], Valid until 01/24/24.

## 2023-08-11 ENCOUNTER — Ambulatory Visit
Admission: RE | Admit: 2023-08-11 | Discharge: 2023-08-11 | Disposition: A | Discharge status: Home / Self Care | Payer: Medicare HMO | Source: Ambulatory Visit | Attending: Gastroenterology | Admitting: Gastroenterology

## 2023-08-11 DIAGNOSIS — Z86010 Personal history of colonic polyps: Secondary | ICD-10-CM | POA: Diagnosis not present

## 2023-08-11 DIAGNOSIS — K5730 Diverticulosis of large intestine without perforation or abscess without bleeding: Secondary | ICD-10-CM | POA: Diagnosis not present

## 2023-08-12 ENCOUNTER — Encounter (INDEPENDENT_AMBULATORY_CARE_PROVIDER_SITE_OTHER): Payer: Self-pay

## 2023-08-24 DIAGNOSIS — Z940 Kidney transplant status: Secondary | ICD-10-CM | POA: Diagnosis not present

## 2023-08-25 DIAGNOSIS — Z08 Encounter for follow-up examination after completed treatment for malignant neoplasm: Secondary | ICD-10-CM | POA: Diagnosis not present

## 2023-08-25 DIAGNOSIS — D485 Neoplasm of uncertain behavior of skin: Secondary | ICD-10-CM | POA: Diagnosis not present

## 2023-08-25 DIAGNOSIS — L821 Other seborrheic keratosis: Secondary | ICD-10-CM | POA: Diagnosis not present

## 2023-08-25 DIAGNOSIS — C44222 Squamous cell carcinoma of skin of right ear and external auricular canal: Secondary | ICD-10-CM | POA: Diagnosis not present

## 2023-08-25 DIAGNOSIS — D225 Melanocytic nevi of trunk: Secondary | ICD-10-CM | POA: Diagnosis not present

## 2023-08-25 DIAGNOSIS — C44622 Squamous cell carcinoma of skin of right upper limb, including shoulder: Secondary | ICD-10-CM | POA: Diagnosis not present

## 2023-08-25 DIAGNOSIS — L814 Other melanin hyperpigmentation: Secondary | ICD-10-CM | POA: Diagnosis not present

## 2023-08-25 DIAGNOSIS — Z85828 Personal history of other malignant neoplasm of skin: Secondary | ICD-10-CM | POA: Diagnosis not present

## 2023-09-02 ENCOUNTER — Other Ambulatory Visit: Payer: Self-pay | Admitting: Family Medicine

## 2023-09-06 ENCOUNTER — Telehealth: Payer: Self-pay | Admitting: Family Medicine

## 2023-09-06 ENCOUNTER — Other Ambulatory Visit: Payer: Self-pay | Admitting: Family Medicine

## 2023-09-06 NOTE — Telephone Encounter (Signed)
Prescription Request  09/06/2023  LOV: 05/26/2023  What is the name of the medication or equipment? levothyroxine (SYNTHROID) 25 MCG tablet Patient is completely out of medication and is requesting temp fill until his appointment on 9/27  Have you contacted your pharmacy to request a refill? Yes   Which pharmacy would you like this sent to?  [facility] [address] [place], [place] - [address] [place] [address] Phone: [phone] Fax: [phone]    Patient notified that their request is being sent to the clinical staff for review and that they should receive a response within 2 business days.   Please advise at Mobile [phone] (mobile)

## 2023-09-06 NOTE — Telephone Encounter (Signed)
Refill sent.

## 2023-09-14 DIAGNOSIS — D0461 Carcinoma in situ of skin of right upper limb, including shoulder: Secondary | ICD-10-CM | POA: Diagnosis not present

## 2023-09-21 DIAGNOSIS — K922 Gastrointestinal hemorrhage, unspecified: Secondary | ICD-10-CM | POA: Diagnosis not present

## 2023-09-21 DIAGNOSIS — M109 Gout, unspecified: Secondary | ICD-10-CM | POA: Diagnosis not present

## 2023-09-21 DIAGNOSIS — R0902 Hypoxemia: Secondary | ICD-10-CM | POA: Diagnosis not present

## 2023-09-21 DIAGNOSIS — I129 Hypertensive chronic kidney disease with stage 1 through stage 4 chronic kidney disease, or unspecified chronic kidney disease: Secondary | ICD-10-CM | POA: Diagnosis not present

## 2023-09-21 DIAGNOSIS — E669 Obesity, unspecified: Secondary | ICD-10-CM | POA: Diagnosis not present

## 2023-09-21 DIAGNOSIS — Z940 Kidney transplant status: Secondary | ICD-10-CM | POA: Diagnosis not present

## 2023-09-21 DIAGNOSIS — R739 Hyperglycemia, unspecified: Secondary | ICD-10-CM | POA: Diagnosis not present

## 2023-09-21 DIAGNOSIS — M7989 Other specified soft tissue disorders: Secondary | ICD-10-CM | POA: Diagnosis not present

## 2023-09-24 ENCOUNTER — Ambulatory Visit (INDEPENDENT_AMBULATORY_CARE_PROVIDER_SITE_OTHER): Payer: Medicare HMO | Admitting: Family Medicine

## 2023-09-24 ENCOUNTER — Encounter: Payer: Self-pay | Admitting: Family Medicine

## 2023-09-24 VITALS — BP 124/76 | HR 67 | Temp 98.0°F | Ht 70.0 in | Wt 189.4 lb

## 2023-09-24 DIAGNOSIS — Z940 Kidney transplant status: Secondary | ICD-10-CM | POA: Diagnosis not present

## 2023-09-24 DIAGNOSIS — E7800 Pure hypercholesterolemia, unspecified: Secondary | ICD-10-CM

## 2023-09-24 DIAGNOSIS — N1831 Chronic kidney disease, stage 3a: Secondary | ICD-10-CM | POA: Diagnosis not present

## 2023-09-24 DIAGNOSIS — I10 Essential (primary) hypertension: Secondary | ICD-10-CM | POA: Diagnosis not present

## 2023-09-24 DIAGNOSIS — Z0000 Encounter for general adult medical examination without abnormal findings: Secondary | ICD-10-CM | POA: Diagnosis not present

## 2023-09-24 DIAGNOSIS — E039 Hypothyroidism, unspecified: Secondary | ICD-10-CM

## 2023-09-24 MED ORDER — CLOPIDOGREL BISULFATE 75 MG PO TABS: 75.0000 mg | ORAL_TABLET | Freq: Every day | ORAL | 1 refills | Status: DC

## 2023-09-24 MED ORDER — CARVEDILOL 12.5 MG PO TABS: ORAL_TABLET | ORAL | 1 refills | Status: DC

## 2023-09-24 MED ORDER — LEVOTHYROXINE SODIUM 25 MCG PO TABS: 25.0000 ug | ORAL_TABLET | Freq: Every day | ORAL | 1 refills | Status: DC

## 2023-09-24 MED ORDER — SIMVASTATIN 20 MG PO TABS: 20.0000 mg | ORAL_TABLET | Freq: Every day | ORAL | 1 refills | Status: DC

## 2023-09-24 MED ORDER — ALLOPURINOL 300 MG PO TABS: 300.0000 mg | ORAL_TABLET | Freq: Every day | ORAL | 1 refills | Status: DC

## 2023-09-24 NOTE — Progress Notes (Signed)
Office Note 09/24/2023  CC:  Chief Complaint  Patient presents with   Annual Exam    Pt is fasting   Patient is a 87 y.o. male who is here for annual health maintenance exam and 58-month follow-up hypertension, hypothyroidism, and chronic renal insufficiency stage III. A/P as of last visit: "1 hypertension, well-controlled on carvedilol 12.5 mg tabs, one half tab twice daily.   2 hypothyroidism. TSHs have been normal on 25 mcg of levothyroxine daily. TSH today.   3.  Chronic renal insufficiency stage III. Avoid NSAIDs and hydrate well. Electrolytes and creatinine today.   4.  Aspiration with liquids.  Progressive problem. DG esophagram 01/2023 showed overall functional oropharyngeal swallow ability.  He does seem to do better if he takes his pills with applesauce.   #5 hypercholesterolemia. Doing well on simvastatin 20 mg a day. Last LDL in September 2023 was 69. Recheck lipids in 3 mo."  INTERIM HX: Curtis Jimenez feels well. Mood and sleep are good. Energy level is good.  ROS as above, plus--> no fevers, no CP, no SOB, no wheezing, no cough, no dizziness, no HAs, no rashes, no melena/hematochezia.  No polyuria or polydipsia.  No myalgias or arthralgias.  No focal weakness, paresthesias, or tremors.  No acute vision or hearing abnormalities.  No dysuria or unusual/new urinary urgency or frequency.  No recent changes in lower legs. No n/v/d or abd pain.  No palpitations.     Past Medical History:  Diagnosis Date   Basal cell carcinoma    neck (skin MD 2X per year)   Bifascicular block 2021   ectopy, asymptomatic.  Cards->observe   Bipolar disorder (HCC)    BPH (benign prostatic hyperplasia)    with hx of elevated PSA; followed by Annabell Howells, [facility] as of 2024   Chronic renal insufficiency, stage III (moderate) (HCC)    in pt w/hx of renal transplant.  Post-transplant sCr 1.4-1.6.   Coronary atherosclerosis    LM and 3 V dz noted on CT 08/2019 performed for eval of  interstitial lung dz in the setting of mild DOE and mild hypoxia. Cards->no stress tesing indicated->primary prevention emphasized   CVA (cerebral vascular accident) (HCC) 11/2016   Pontine (vertebrobasilar--imaging neg), TPA given.  Pt discharged on plavix.  Carotid dopplers ok, echocardiogram ok.  Residual deficit: vertigo but this improved greatly with therapy.   DOE (dyspnea on exertion) 2020   DOE and ? hypoxia->CXR 08/29/19--->diffuse interstitial opacities-? acute inflammatory process->Dr. Sherene Sires eval'd him and was underwhelmed by the CXR and exam->CT chest 09/26/19 "Very subtle areas of mild ground-glass attenuatio-nonspecific", mild air trapping. Per Dr. Sherene Sires, no signif ILD, improving with inc ambulation (post-inflamm pulm fibrosis?)   Elevated PSA    12/2022, mild, referred to urology   Esophagitis 06/14/2020   EGD->+distal esophagitis, h pylori neg, mild distal esoph stenosis widened with forceps, benign gastric polyps.   GERD (gastroesophageal reflux disease)    Gout    History of adenomatous polyp of colon 2018   Recall 5 yrs   Hyperlipidemia    Hypertension    Hypothyroidism    Lumbar spondylosis    Ptosis due to aging    Right   Rectal bleeding 09/2017   Admitted for obs due to Hb drop.  GI consulted---obs recommended.  No transfusion required.  Plavix was d/c'd, ferrous sulfate recommended.  Outpt GI f/u--plavix restarted and upper endoscopy was normal and colonoscopy showed 2 polyps and severe diverticular dz. Iron d/c'd 04/24/19.   Renal cyst  per pt he got MRI kidneys at the [facility] 12/2020 and the images will be viewed by Dr. Lacy Duverney   Restless legs syndrome    S/p cadaver renal transplant 2013   Secondary to HTN +lithium toxicity over 30 yrs caused kidney destruction [facility] transplant MDs)   Seborrheic dermatitis    eyebrows worst: Hytone rx'd by Dr. Jorja Loa.   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   left jawline(CX35FU), Left forehead (CX35FU) right  shoulder (CX35FU), R forearm   Swallowing dysfunction    thin liquids (swallowing eval 01/2023)    Past Surgical History:  Procedure Laterality Date   AV FISTULA PLACEMENT  04/08/2012   Procedure: ARTERIOVENOUS (AV) FISTULA CREATION;  Surgeon: Chuck Hint, MD;  Location: [facility] OR;  Service: Vascular;  Laterality: Left;  Creation of left brachial cephalic arteriovenous fistula   BIOPSY  06/14/2020   Procedure: BIOPSY;  Surgeon: Shellia Cleverly, DO;  Location: [facility];  Service: Gastroenterology;;   CATARACT EXTRACTION, BILATERAL  05/2018   CHOLECYSTECTOMY N/A 08/18/2021   Procedure: LAPAROSCOPIC CHOLECYSTECTOMY WITH CHOLANGIOGRAM;  Surgeon: Darnell Level, MD;  Location: [facility] ORS;  Service: General;  Laterality: N/A;   COLONOSCOPY  2013; 11/24/17   2013; Normal except diverticulosis (recall 10 yrs).  2018 (for GI bleeding)--severe diverticular dz + 2 adenomatous polyps.  Recall 5 yrs.   DG CHEST AP OR PA ([facility] HX)  06/03/2020   Bibasilar atelectasis. No focal consoliadation definitively identified.   dilation for GERD     ESOPHAGOGASTRODUODENOSCOPY  11/24/2017   gastric polyps x 2, otherwise normal.   ESOPHAGOGASTRODUODENOSCOPY N/A 06/14/2020   +distal esophagitis, h pylori neg, mild distal esoph stenosis widened with forceps, benign gastric polypsProcedure: ESOPHAGOGASTRODUODENOSCOPY (EGD);  Surgeon: Shellia Cleverly, DO;  Location: [facility];  Service: Gastroenterology;  Laterality: N/A;  Diagnostic EGD as patietn last took Plavix 11am on 06/12/2020   KIDNEY TRANSPLANT  11/06/12   [facility] (cadaveric)   LIGATION OF ARTERIOVENOUS  FISTULA Left 10/21/2021   Procedure: LIGATION OF LEFT ARTERIOVENOUS  FISTULA;  Surgeon: Chuck Hint, MD;  Location: [facility] OR;  Service: Vascular;  Laterality: Left;   PROSTATE BIOPSY  2011   no malignancy   TRANSTHORACIC ECHOCARDIOGRAM  11/2016   Normal LV systolic fxn, EF 40-98%.  Grade I DD.  Mild aortic root dilatation, mild MV regurg.     Family History  Problem Relation Age of Onset   Ulcers Mother        GI bleed    Heart disease Father    Bladder Cancer Father    Colon cancer Neg Hx    Esophageal cancer Neg Hx    Pancreatic cancer Neg Hx    Rectal cancer Neg Hx    Stomach cancer Neg Hx     Social History   Socioeconomic History   Marital status: Married    Spouse name: Not on file   Number of children: 4   Years of education: Not on file   Highest education level: Not on file  Occupational History   Not on file  Tobacco Use   Smoking status: Former    Types: Pipe, Cigars    Quit date: 05/11/1974    Years since quitting: 49.4   Smokeless tobacco: Never  Vaping Use   Vaping status: Never Used  Substance and Sexual Activity   Alcohol use: No   Drug use: No   Sexual activity: Not Currently    Partners: Female  Other Topics Concern  Not on file  Social History Narrative   Married, 4 children, 9 GGC, 2GGC.   Occupation: retired Dance movement psychotherapist.  Originally from [place].   Tobacco: quit 1975; smoked pipes and cigars x 15 yrs prior to this.   Alcohol: none.   Exercise: minimal, but is going to sign up for silver sneakers at the [facility]   Social Determinants of Health   Financial Resource Strain: Low Risk  (06/09/2023)   Overall Financial Resource Strain (CARDIA)    Difficulty of Paying Living Expenses: Not hard at all  Food Insecurity: No Food Insecurity (06/09/2023)   Hunger Vital Sign    Worried About Running Out of Food in the Last Year: Never true    Ran Out of Food in the Last Year: Never true  Transportation Needs: No Transportation Needs (06/09/2023)   PRAPARE - Administrator, Civil Service (Medical): No    Lack of Transportation (Non-Medical): No  Physical Activity: Insufficiently Active (06/09/2023)   Exercise Vital Sign    Days of Exercise per Week: 2 days    Minutes of Exercise per Session: 60 min  Stress: No Stress Concern Present (06/09/2023)   [employer] of  Occupational Health - Occupational Stress Questionnaire    Feeling of Stress : Not at all  Social Connections: Socially Integrated (06/09/2023)   Social Connection and Isolation Panel [NHANES]    Frequency of Communication with Friends and Family: Twice a week    Frequency of Social Gatherings with Friends and Family: More than three times a week    Attends Religious Services: More than 4 times per year    Active Member of [employer] or Organizations: Yes    Attends Banker Meetings: 1 to 4 times per year    Marital Status: Married  Catering manager Violence: Not At Risk (06/09/2023)   Humiliation, Afraid, Rape, and Kick questionnaire    Fear of Current or Ex-Partner: No    Emotionally Abused: No    Physically Abused: No    Sexually Abused: No    MEDS:  Coreg 12.5 mg tab, one half tab twice daily, allopurinol 300 mg a day, Plavix 75 mg a day, Synthroid 25 mcg a day, mirtazapine 15 mg nightly, Myfortic 1 180 mg tab twice a day, pantoprazole 40 mg a day, risperidone 1 mg tab, 2 at bedtime, simvastatin 20 mg a day, Bactrim 400-80, 1 tab Monday Wednesday and Friday, and tacrolimus 0.5 mg, 2 caps in the morning and 1 At night.   Allergies  Allergen Reactions   Amantadine Hcl Itching   Chlorpromazine Hcl Other (See Comments)    "fell flat on face"   Grapefruit Flavor [Flavoring Agent] Other (See Comments)    Can't take with medication    Review of Systems  Constitutional:  Negative for appetite change, chills, fatigue and fever.  HENT:  Negative for congestion, dental problem, ear pain and sore throat.   Eyes:  Negative for discharge, redness and visual disturbance.  Respiratory:  Negative for cough, chest tightness, shortness of breath and wheezing.   Cardiovascular:  Negative for chest pain, palpitations and leg swelling.  Gastrointestinal:  Negative for abdominal pain, blood in stool, diarrhea, nausea and vomiting.  Genitourinary:  Negative for difficulty urinating, dysuria,  flank pain, frequency, hematuria and urgency.  Musculoskeletal:  Negative for arthralgias, back pain, joint swelling, myalgias and neck stiffness.  Skin:  Negative for pallor and rash.  Neurological:  Negative for dizziness, speech difficulty, weakness and headaches.  Hematological:  Negative for adenopathy. Does not bruise/bleed easily.  Psychiatric/Behavioral:  Negative for confusion and sleep disturbance. The patient is not nervous/anxious.     PE;    09/24/2023    9:06 AM 07/16/2023    2:35 PM 06/22/2023    3:27 PM  Vitals with BMI  Height 5\' 10"  5\' 9"    Weight 189 lbs 6 oz 187 lbs   BMI 27.18 27.6   Systolic 143 120 161  Diastolic 82 72 72  Pulse 67 86 82   Gen: Alert, well appearing.  Patient is oriented to person, place, time, and situation. AFFECT: pleasant, lucid thought and speech.  Eyes: no injection, icteris, swelling, or exudate.  EOMI, PERRLA. Nose: no drainage or turbinate edema/swelling.  No injection or focal lesion.  Mouth: lips without lesion/swelling.  Oral mucosa pink and moist.  Dentition intact and without obvious caries or gingival swelling.  Oropharynx without erythema, exudate, or swelling.  Neck: supple/nontender.  No LAD, mass, or TM.  Carotid pulses 2+ bilaterally, without bruits. CV: RRR, no m/r/g.   LUNGS: CTA bilat, nonlabored resps, good aeration in all lung fields. ABD: soft, NT, ND. EXT: no clubbing, cyanosis, or edema.  Musculoskeletal: no joint swelling, erythema, warmth, or tenderness.  ROM of all joints intact. Skin -no rash, no pallor, no icterus  Pertinent labs:  Lab Results  Component Value Date   TSH 0.72 05/26/2023   Lab Results  Component Value Date   WBC 5.1 05/19/2023   HGB 14.0 05/19/2023   HCT 42 05/19/2023   MCV 89.6 09/22/2022   PLT 207 05/19/2023   Lab Results  Component Value Date   CREATININE 1.70 (H) 05/26/2023   BUN 25 05/26/2023   NA 140 05/26/2023   K 4.1 05/26/2023   CL 107 05/26/2023   CO2 21 05/26/2023    Lab Results  Component Value Date   ALT 14 05/19/2023   AST 14 05/19/2023   ALKPHOS 72 05/19/2023   BILITOT 0.3 01/22/2023   Lab Results  Component Value Date   CHOL 136 09/22/2022   Lab Results  Component Value Date   HDL 37.60 (L) 09/22/2022   Lab Results  Component Value Date   LDLCALC 69 09/22/2022   Lab Results  Component Value Date   TRIG 145.0 09/22/2022   Lab Results  Component Value Date   CHOLHDL 4 09/22/2022   Lab Results  Component Value Date   PSA 6.69 (H) 01/22/2023   PSA 4.2 (H) 11/05/2017   PSA 3.51 03/28/2008   Lab Results  Component Value Date   HGBA1C 5.5 09/18/2021   HGBA1C 5.5 09/18/2021   HGBA1C 5.5 (A) 09/18/2021   HGBA1C 5.5 09/18/2021   ASSESSMENT AND PLAN:   #1 health maintenance exam: Reviewed age and gender appropriate health maintenance issues (prudent diet, regular exercise, health risks of tobacco and excessive alcohol, use of seatbelts, fire alarms in home, use of sunscreen).  Also reviewed age and gender appropriate health screening as well as vaccine recommendations. Vaccines: Flu->he'll get a pharm.  Recommended RSV vaccine and COVID-vaccine at pharmacy.  otherwise all up-to-date. Labs: Health panel labs today Prostate cancer screening: elev PSA, most recent was 06/03/2023, level was 5.59 which was stable.  Urology discussed potential for discontinuing PSA testing but patient wanted to continue so he has plans to follow-up with them in June 2025. Colon ca screening: Adenomatous polyps on colonoscopy 2018.  He had a virtual colonoscopy in August this year and it showed no polyps.  #  2 hypertension, stable on 6.25 Coreg twice a day. Electrolytes and creatinine today.  3.  Hypercholesterolemia, stable on simvastatin 20 mg a day. Lipid panel today.  4. hypothyroidism. TSHs have been normal on 25 mcg of levothyroxine daily. TSH today.   5. Chronic renal insufficiency stage III, hx renal transplant. Avoid NSAIDs and hydrate  well. Electrolytes and creatinine today. Followed by nephrology.  An After Visit Summary was printed and given to the patient.  FOLLOW UP:  No follow-ups on file.  Signed:  Santiago Bumpers, MD           09/24/2023

## 2023-09-25 LAB — LIPID PANEL
Cholesterol: 127 mg/dL (ref <200)
HDL: 41 mg/dL (ref >=40)
LDL Cholesterol (Calc): 66 mg/dL
Non-HDL Cholesterol (Calc): 86 mg/dL (ref <130)
Total CHOL/HDL Ratio: 3.1 (calc) (ref <5.0)
Triglycerides: 117 mg/dL (ref <150)

## 2023-09-25 LAB — BASIC METABOLIC PANEL
BUN/Creatinine Ratio: 16 (calc) (ref 6–22)
BUN: 23 mg/dL (ref 7–25)
CO2: 26 mmol/L (ref 20–32)
Calcium: 9.3 mg/dL (ref 8.6–10.3)
Chloride: 106 mmol/L (ref 98–110)
Creat: 1.48 mg/dL — ABNORMAL HIGH (ref 0.70–1.22)
Glucose, Bld: 93 mg/dL (ref 65–99)
Potassium: 4.4 mmol/L (ref 3.5–5.3)
Sodium: 140 mmol/L (ref 135–146)

## 2023-09-25 LAB — TSH: TSH: 0.8 m[IU]/L (ref 0.40–4.50)

## 2023-10-04 DIAGNOSIS — D044 Carcinoma in situ of skin of scalp and neck: Secondary | ICD-10-CM | POA: Diagnosis not present

## 2023-10-19 DIAGNOSIS — R69 Illness, unspecified: Secondary | ICD-10-CM | POA: Diagnosis not present

## 2023-11-08 DIAGNOSIS — I129 Hypertensive chronic kidney disease with stage 1 through stage 4 chronic kidney disease, or unspecified chronic kidney disease: Secondary | ICD-10-CM | POA: Diagnosis not present

## 2023-11-08 DIAGNOSIS — Z940 Kidney transplant status: Secondary | ICD-10-CM | POA: Diagnosis not present

## 2023-11-09 DIAGNOSIS — H524 Presbyopia: Secondary | ICD-10-CM | POA: Diagnosis not present

## 2023-11-09 DIAGNOSIS — H02401 Unspecified ptosis of right eyelid: Secondary | ICD-10-CM | POA: Diagnosis not present

## 2023-11-09 DIAGNOSIS — H35363 Drusen (degenerative) of macula, bilateral: Secondary | ICD-10-CM | POA: Diagnosis not present

## 2023-11-09 DIAGNOSIS — H02832 Dermatochalasis of right lower eyelid: Secondary | ICD-10-CM | POA: Diagnosis not present

## 2023-11-09 DIAGNOSIS — H02834 Dermatochalasis of left upper eyelid: Secondary | ICD-10-CM | POA: Diagnosis not present

## 2023-11-26 DIAGNOSIS — R69 Illness, unspecified: Secondary | ICD-10-CM | POA: Diagnosis not present

## 2024-01-07 ENCOUNTER — Other Ambulatory Visit: Payer: Self-pay

## 2024-01-19 DIAGNOSIS — M7989 Other specified soft tissue disorders: Secondary | ICD-10-CM | POA: Diagnosis not present

## 2024-01-19 DIAGNOSIS — R739 Hyperglycemia, unspecified: Secondary | ICD-10-CM | POA: Diagnosis not present

## 2024-01-19 DIAGNOSIS — R0902 Hypoxemia: Secondary | ICD-10-CM | POA: Diagnosis not present

## 2024-01-19 DIAGNOSIS — Z940 Kidney transplant status: Secondary | ICD-10-CM | POA: Diagnosis not present

## 2024-01-19 DIAGNOSIS — M109 Gout, unspecified: Secondary | ICD-10-CM | POA: Diagnosis not present

## 2024-01-19 DIAGNOSIS — E669 Obesity, unspecified: Secondary | ICD-10-CM | POA: Diagnosis not present

## 2024-01-19 DIAGNOSIS — I129 Hypertensive chronic kidney disease with stage 1 through stage 4 chronic kidney disease, or unspecified chronic kidney disease: Secondary | ICD-10-CM | POA: Diagnosis not present

## 2024-01-19 DIAGNOSIS — K922 Gastrointestinal hemorrhage, unspecified: Secondary | ICD-10-CM | POA: Diagnosis not present

## 2024-02-29 DIAGNOSIS — L821 Other seborrheic keratosis: Secondary | ICD-10-CM | POA: Diagnosis not present

## 2024-02-29 DIAGNOSIS — D485 Neoplasm of uncertain behavior of skin: Secondary | ICD-10-CM | POA: Diagnosis not present

## 2024-02-29 DIAGNOSIS — L905 Scar conditions and fibrosis of skin: Secondary | ICD-10-CM | POA: Diagnosis not present

## 2024-02-29 DIAGNOSIS — L853 Xerosis cutis: Secondary | ICD-10-CM | POA: Diagnosis not present

## 2024-02-29 DIAGNOSIS — D0439 Carcinoma in situ of skin of other parts of face: Secondary | ICD-10-CM | POA: Diagnosis not present

## 2024-02-29 DIAGNOSIS — Z08 Encounter for follow-up examination after completed treatment for malignant neoplasm: Secondary | ICD-10-CM | POA: Diagnosis not present

## 2024-02-29 DIAGNOSIS — L814 Other melanin hyperpigmentation: Secondary | ICD-10-CM | POA: Diagnosis not present

## 2024-02-29 DIAGNOSIS — Z85828 Personal history of other malignant neoplasm of skin: Secondary | ICD-10-CM | POA: Diagnosis not present

## 2024-02-29 DIAGNOSIS — D225 Melanocytic nevi of trunk: Secondary | ICD-10-CM | POA: Diagnosis not present

## 2024-03-13 ENCOUNTER — Other Ambulatory Visit: Payer: Self-pay | Admitting: Physician Assistant

## 2024-03-23 ENCOUNTER — Ambulatory Visit: Payer: Medicare HMO | Admitting: Family Medicine

## 2024-03-30 DIAGNOSIS — D0439 Carcinoma in situ of skin of other parts of face: Secondary | ICD-10-CM | POA: Diagnosis not present

## 2024-03-30 DIAGNOSIS — D485 Neoplasm of uncertain behavior of skin: Secondary | ICD-10-CM | POA: Diagnosis not present

## 2024-04-02 ENCOUNTER — Other Ambulatory Visit: Payer: Self-pay | Admitting: Family Medicine

## 2024-04-03 NOTE — Telephone Encounter (Signed)
 Pt has upcoming appt

## 2024-04-04 ENCOUNTER — Other Ambulatory Visit: Payer: Self-pay

## 2024-04-04 MED ORDER — LEVOTHYROXINE SODIUM 25 MCG PO TABS: 25.0000 ug | ORAL_TABLET | Freq: Every day | ORAL | 0 refills | Status: DC

## 2024-04-06 ENCOUNTER — Ambulatory Visit (INDEPENDENT_AMBULATORY_CARE_PROVIDER_SITE_OTHER): Payer: Self-pay | Admitting: Family Medicine

## 2024-04-06 ENCOUNTER — Encounter: Payer: Self-pay | Admitting: Family Medicine

## 2024-04-06 VITALS — BP 141/86 | HR 68 | Temp 97.7°F | Ht 70.0 in | Wt 193.4 lb

## 2024-04-06 DIAGNOSIS — E7800 Pure hypercholesterolemia, unspecified: Secondary | ICD-10-CM | POA: Diagnosis not present

## 2024-04-06 DIAGNOSIS — N1831 Chronic kidney disease, stage 3a: Secondary | ICD-10-CM | POA: Diagnosis not present

## 2024-04-06 DIAGNOSIS — Z0184 Encounter for antibody response examination: Secondary | ICD-10-CM

## 2024-04-06 DIAGNOSIS — E039 Hypothyroidism, unspecified: Secondary | ICD-10-CM

## 2024-04-06 DIAGNOSIS — Z940 Kidney transplant status: Secondary | ICD-10-CM

## 2024-04-06 DIAGNOSIS — I10 Essential (primary) hypertension: Secondary | ICD-10-CM | POA: Diagnosis not present

## 2024-04-06 LAB — TSH: TSH: 0.76 u[IU]/mL (ref 0.35–5.50)

## 2024-04-06 MED ORDER — CLOPIDOGREL BISULFATE 75 MG PO TABS: 75.0000 mg | ORAL_TABLET | Freq: Every day | ORAL | 3 refills | Status: AC

## 2024-04-06 MED ORDER — CARVEDILOL 6.25 MG PO TABS: 6.2500 mg | ORAL_TABLET | Freq: Two times a day (BID) | ORAL | 3 refills | Status: AC

## 2024-04-06 MED ORDER — LEVOTHYROXINE SODIUM 25 MCG PO TABS: 25.0000 ug | ORAL_TABLET | Freq: Every day | ORAL | 3 refills | Status: AC

## 2024-04-06 MED ORDER — ALLOPURINOL 300 MG PO TABS: 300.0000 mg | ORAL_TABLET | Freq: Every day | ORAL | 3 refills | Status: AC

## 2024-04-06 MED ORDER — SIMVASTATIN 20 MG PO TABS: 20.0000 mg | ORAL_TABLET | Freq: Every day | ORAL | 3 refills | Status: AC

## 2024-04-06 NOTE — Progress Notes (Signed)
 OFFICE VISIT  04/06/2024  CC:  Chief Complaint  Patient presents with   Medical Management of Chronic Issues    Pt is fasting    Patient is a 88 y.o. male who presents for 23-month follow-up hypertension, hypothyroidism, hypercholesterolemia, and chronic renal insufficiency stage III. A/P as of last visit: "1 hypertension, stable on 6.25 Coreg twice a day. Electrolytes and creatinine today.   2.  Hypercholesterolemia, stable on simvastatin 20 mg a day. Lipid panel today.   3. hypothyroidism. TSHs have been normal on 25 mcg of levothyroxine daily. TSH today.   4. Chronic renal insufficiency stage III, hx renal transplant. Avoid NSAIDs and hydrate well. Electrolytes and creatinine today. Followed by nephrology."  INTERIM HX: Don feels well.  He had follow-up at his [place] nephrologist about 2 months ago and labs were done.  All labs were stable, specifically serum creatinine 1.72.  Blood pressures normal at home taking one half of a 12.5 mg carvedilol tab twice a day.  He asks if he can be prescribed the 6.25 mg tabs so he does not have to split the pill in half.    Past Medical History:  Diagnosis Date   Basal cell carcinoma    neck (skin MD 2X per year)   Bifascicular block 2021   ectopy, asymptomatic.  Cards->observe   Bipolar disorder (HCC)    BPH (benign prostatic hyperplasia)    with hx of elevated PSA; followed by Annabell Howells, [facility] as of 2024   Chronic renal insufficiency, stage III (moderate) (HCC)    in pt w/hx of renal transplant.  Post-transplant sCr 1.4-1.6.   Coronary atherosclerosis    LM and 3 V dz noted on CT 08/2019 performed for eval of interstitial lung dz in the setting of mild DOE and mild hypoxia. Cards->no stress tesing indicated->primary prevention emphasized   CVA (cerebral vascular accident) (HCC) 11/2016   Pontine (vertebrobasilar--imaging neg), TPA given.  Pt discharged on plavix.  Carotid dopplers ok, echocardiogram ok.  Residual deficit:  vertigo but this improved greatly with therapy.   DOE (dyspnea on exertion) 2020   DOE and ? hypoxia->CXR 08/29/19--->diffuse interstitial opacities-? acute inflammatory process->Dr. Sherene Sires eval'd him and was underwhelmed by the CXR and exam->CT chest 09/26/19 "Very subtle areas of mild ground-glass attenuatio-nonspecific", mild air trapping. Per Dr. Sherene Sires, no signif ILD, improving with inc ambulation (post-inflamm pulm fibrosis?)   Elevated PSA    12/2022, mild, referred to urology   Esophagitis 06/14/2020   EGD->+distal esophagitis, h pylori neg, mild distal esoph stenosis widened with forceps, benign gastric polyps.   GERD (gastroesophageal reflux disease)    Gout    History of adenomatous polyp of colon 2018   Recall 5 yrs   Hyperlipidemia    Hypertension    Hypothyroidism    Lumbar spondylosis    Ptosis due to aging    Right   Rectal bleeding 09/2017   Admitted for obs due to Hb drop.  GI consulted---obs recommended.  No transfusion required.  Plavix was d/c'd, ferrous sulfate recommended.  Outpt GI f/u--plavix restarted and upper endoscopy was normal and colonoscopy showed 2 polyps and severe diverticular dz. Iron d/c'd 04/24/19.   Renal cyst    per pt he got MRI kidneys at the [facility] 12/2020 and the images will be viewed by Dr. Lacy Duverney   Restless legs syndrome    S/p cadaver renal transplant 2013   Secondary to HTN +lithium toxicity over 30 yrs caused kidney destruction [facility] transplant MDs)  Seborrheic dermatitis    eyebrows worst: Hytone rx'd by Dr. Jorja Loa.   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   left jawline(CX35FU), Left forehead (CX35FU) right shoulder (CX35FU), R forearm   Swallowing dysfunction    thin liquids (swallowing eval 01/2023)    Past Surgical History:  Procedure Laterality Date   AV FISTULA PLACEMENT  04/08/2012   Procedure: ARTERIOVENOUS (AV) FISTULA CREATION;  Surgeon: Chuck Hint, MD;  Location: [facility] OR;  Service: Vascular;   Laterality: Left;  Creation of left brachial cephalic arteriovenous fistula   BIOPSY  06/14/2020   Procedure: BIOPSY;  Surgeon: Shellia Cleverly, DO;  Location: [facility]COPY;  Service: Gastroenterology;;   CATARACT EXTRACTION, BILATERAL  05/2018   CHOLECYSTECTOMY N/A 08/18/2021   Procedure: LAPAROSCOPIC CHOLECYSTECTOMY WITH CHOLANGIOGRAM;  Surgeon: Darnell Level, MD;  Location: [facility] ORS;  Service: General;  Laterality: N/A;   COLONOSCOPY  2013; 11/24/17   2013; Normal except diverticulosis (recall 10 yrs).  2018 (for GI bleeding)--severe diverticular dz + 2 adenomatous polyps.  Recall 5 yrs.   DG CHEST AP OR PA ([facility] HX)  06/03/2020   Bibasilar atelectasis. No focal consoliadation definitively identified.   dilation for GERD     ESOPHAGOGASTRODUODENOSCOPY  11/24/2017   gastric polyps x 2, otherwise normal.   ESOPHAGOGASTRODUODENOSCOPY N/A 06/14/2020   +distal esophagitis, h pylori neg, mild distal esoph stenosis widened with forceps, benign gastric polypsProcedure: ESOPHAGOGASTRODUODENOSCOPY (EGD);  Surgeon: Shellia Cleverly, DO;  Location: [facility];  Service: Gastroenterology;  Laterality: N/A;  Diagnostic EGD as patietn last took Plavix 11am on 06/12/2020   KIDNEY TRANSPLANT  11/06/2012   [facility] (cadaveric)   LIGATION OF ARTERIOVENOUS  FISTULA Left 10/21/2021   Procedure: LIGATION OF LEFT ARTERIOVENOUS  FISTULA;  Surgeon: Chuck Hint, MD;  Location: [facility] OR;  Service: Vascular;  Laterality: Left;   PROSTATE BIOPSY  2011   no malignancy   TRANSTHORACIC ECHOCARDIOGRAM  11/2016   Normal LV systolic fxn, EF 95-62%.  Grade I DD.  Mild aortic root dilatation, mild MV regurg.   Virtual colonoscopy     07/2023 NO POLYPS    Outpatient Medications Prior to Visit  Medication Sig Dispense Refill   acetaminophen (TYLENOL) 325 MG tablet Take 650 mg by mouth every 6 (six) hours as needed for mild pain.     carvedilol (COREG) 12.5 MG tablet TAKE 1/2 (ONE-HALF) TABLET BY MOUTH IN THE  MORNING AND 1/2 TABLET IN THE EVENING 90 tablet 1   hydrOXYzine (ATARAX/VISTARIL) 25 MG tablet Take 50 mg by mouth at bedtime.     levothyroxine (SYNTHROID) 25 MCG tablet Take 1 tablet (25 mcg total) by mouth daily. MUST KEEP APPT FOR FURTHER REFILLS 30 tablet 0   mirtazapine (REMERON) 15 MG tablet Take 1 tablet (15 mg total) by mouth at bedtime. 90 tablet 3   Multiple Vitamin (MULTIVITAMIN WITH MINERALS) TABS tablet Take 1 tablet by mouth at bedtime.     mycophenolate (MYFORTIC) 180 MG EC tablet Take 180 mg by mouth 2 (two) times daily.     Omega-3 Fatty Acids (FISH OIL) 1000 MG CAPS Take 2,000 mg by mouth in the morning and at bedtime.     pantoprazole (PROTONIX) 40 MG tablet Take 1 tablet by mouth twice daily 180 tablet 0   PREVIDENT 5000 BOOSTER PLUS 1.1 % PSTE 3 (three) times daily.     risperiDONE (RISPERDAL) 1 MG tablet Take 2 mg by mouth at bedtime. Half of a 2mg  tab  sulfamethoxazole-trimethoprim (BACTRIM,SEPTRA) 400-80 MG tablet Take 1 tablet by mouth every Monday, Wednesday, and Friday. In the morning     tacrolimus (PROGRAF) 0.5 MG capsule Take 0.5-1 mg by mouth See admin instructions. Take 2 capsules (1 mg) by mouth in the morning & take 1 capsule (0.5 mg) by mouth at night.     amoxicillin (AMOXIL) 500 MG capsule Take 500 mg by mouth as needed (for dental). (Patient not taking: Reported on 04/06/2024)     allopurinol (ZYLOPRIM) 300 MG tablet Take 1 tablet (300 mg total) by mouth daily. 90 tablet 1   clopidogrel (PLAVIX) 75 MG tablet Take 1 tablet (75 mg total) by mouth daily. 90 tablet 1   levothyroxine (SYNTHROID) 25 MCG tablet Take 1 tablet (25 mcg total) by mouth daily. 90 tablet 1   simvastatin (ZOCOR) 20 MG tablet Take 1 tablet (20 mg total) by mouth daily. 90 tablet 1   No facility-administered medications prior to visit.    Allergies  Allergen Reactions   Amantadine Hcl Itching   Chlorpromazine Hcl Other (See Comments)    "fell flat on face"   Grapefruit Flavor  [Flavoring Agent (Non-Screening)] Other (See Comments)    Can't take with medication    Review of Systems As per HPI  PE:    04/06/2024   10:21 AM 09/24/2023    9:22 AM 09/24/2023    9:06 AM  Vitals with BMI  Height 5\' 10"   5\' 10"   Weight 193 lbs 6 oz  189 lbs 6 oz  BMI 27.75  27.18  Systolic 141 124 284  Diastolic 86 76 82  Pulse 68  67     Physical Exam  Gen: Alert, well appearing.  Patient is oriented to person, place, time, and situation. AFFECT: pleasant, lucid thought and speech. No further exam today  LABS:  Last CBC Lab Results  Component Value Date   WBC 5.1 05/19/2023   HGB 14.0 05/19/2023   HCT 42 05/19/2023   MCV 89.6 09/22/2022   MCH 28.2 08/07/2021   RDW 14.9 09/22/2022   PLT 207 05/19/2023   Last metabolic panel Lab Results  Component Value Date   GLUCOSE 93 09/24/2023   NA 140 09/24/2023   K 4.4 09/24/2023   CL 106 09/24/2023   CO2 26 09/24/2023   BUN 23 09/24/2023   CREATININE 1.48 (H) 09/24/2023   GFR 37.19 (L) 01/22/2023   CALCIUM 9.3 09/24/2023   PHOS 4.7 (H) 12/05/2010   PROT 5.9 (L) 01/22/2023   ALBUMIN 3.7 05/19/2023   BILITOT 0.3 01/22/2023   ALKPHOS 72 05/19/2023   AST 14 05/19/2023   ALT 14 05/19/2023   ANIONGAP 6 08/07/2021   Last lipids Lab Results  Component Value Date   CHOL 127 09/24/2023   HDL 41 09/24/2023   LDLCALC 66 09/24/2023   TRIG 117 09/24/2023   CHOLHDL 3.1 09/24/2023   Last hemoglobin A1c Lab Results  Component Value Date   HGBA1C 5.5 09/18/2021   HGBA1C 5.5 09/18/2021   HGBA1C 5.5 (A) 09/18/2021   HGBA1C 5.5 09/18/2021   Last thyroid functions Lab Results  Component Value Date   TSH 0.80 09/24/2023   Last vitamin D Lab Results  Component Value Date   VD25OH 47 05/15/2010   IMPRESSION AND PLAN:  1 hypertension, stable on 6.25 Coreg twice a day. Electrolytes and creatinine today.   2.  Hypercholesterolemia, stable on simvastatin 20 mg a day. LDL was 66 about 6 months ago. Plan repeat  lipid  panel in 6 months. Liver panel was normal at the [place] 2 months ago.  3. hypothyroidism. TSHs have been normal on 25 mcg of levothyroxine daily. TSH today.   4. Chronic renal insufficiency stage III, hx renal transplant. Avoid NSAIDs and hydrate well. Serum creatinine was stable at 1.72 when checked at the [place] 2 months ago.  Electrolytes were normal. No new labs today.  #5 question of MMR immunity.  He says he never had measles mumps or rubella and never got the vaccine as a child. He wants to check whether or not he has acquired any immunity so we will check antibodies today. If he does not have any immunity we will check with his nephrologist about how we may approach getting him an MMR vaccine given the fact that it is a live attenuated vaccine and he is on tacrolimus and Myfortic.  FOLLOW UP: Return in about 6 months (around 10/06/2024) for annual CPE (fasting. Next CPE 6 months Signed:  Santiago Bumpers, MD           04/06/2024

## 2024-04-06 NOTE — Patient Instructions (Signed)
 Marland Kitchen

## 2024-04-07 LAB — MEASLES/MUMPS/RUBELLA IMMUNITY
Mumps IgG: 47.6 [AU]/ml
Rubella: 12 {index}
Rubeola IgG: >300 [AU]/ml

## 2024-04-11 DIAGNOSIS — D485 Neoplasm of uncertain behavior of skin: Secondary | ICD-10-CM | POA: Diagnosis not present

## 2024-04-11 DIAGNOSIS — D0439 Carcinoma in situ of skin of other parts of face: Secondary | ICD-10-CM | POA: Diagnosis not present

## 2024-04-12 ENCOUNTER — Ambulatory Visit (INDEPENDENT_AMBULATORY_CARE_PROVIDER_SITE_OTHER): Payer: Medicare HMO | Admitting: Psychiatry

## 2024-04-12 ENCOUNTER — Encounter: Payer: Self-pay | Admitting: Psychiatry

## 2024-04-12 DIAGNOSIS — F319 Bipolar disorder, unspecified: Secondary | ICD-10-CM

## 2024-04-12 DIAGNOSIS — F5105 Insomnia due to other mental disorder: Secondary | ICD-10-CM

## 2024-04-12 MED ORDER — MIRTAZAPINE 15 MG PO TABS: 15.0000 mg | ORAL_TABLET | Freq: Every day | ORAL | 3 refills | Status: AC

## 2024-04-12 NOTE — Progress Notes (Signed)
 [name] [id number] 12/01/1936 88 y.o.  Subjective:   Patient ID:  Curtis Jimenez is a 88 y.o. (DOB 1936/10/09) male.  Chief Complaint:  Chief Complaint  Patient presents with   Follow-up    Mood and meds.    HPI Curtis Jimenez presents to the office today for follow-up of bipolar and sleep.  seen September 2019.  Continued on risperidone 1 mg HS which was reduced from 2 mg previously.  October 2020 appointment with the following noted: 2-3 times weekly initial insomnia.  Other days sleep plenty of hours and naps 2 hours.  2 days ago skipped naps and still couldn't go to sleep.  Asks about melatonin. Plan: No med changes today except for the addition of melatonin.  10/15/2020 appointment with the following noted: Has split leaf philodendron from 40 years ago. Doing excellent except for bad working dreams that are detailed with racing thoughts and anxiety in the dream.  Awakens for 30 min. A couple times per week. Very stable since out of a stressful job and marriage. Plan: No med changes today except for the addition of melatonin. Ok melatonin 5 mg 30 min before sleep  11/13/21 appt noted: Cholecystectomy.   Mood has been fantastic. Still not sleeping well.  EFA.  Melatonin didn't help much.  Typical in bed from MN until 10 and then 2 hours.   No SE.  No mood swings. Stays occupied.  Lunch club 25 years. Legs never recovered strength after kidney transplant 2012-11-07. Plan: Continue risperidone 1 mg HS Off trazodone. No med changes today except for the addition of melatonin.  01/11/2023 appointment noted: "Enjoying some mood swings for a month" due to stressors with wife. W struggling with weight and is in [employer], but eating all the time and gaining weight.  Worry about her.  A good friend with brain tumor. He's reacting with mild depression but fight it.  He thinks it is more than normal reactivity. When has it gets tense and anxious and wants to cry.   Has  felt ok for several days.  No agitation.  Sleep better with melatonin.  No mania.  Been years since this happened.   Plan: Continue risperidone 1 mg HS Off trazodone. Option mirtazapine 15 mg for depression and anxiety if persists  04/12/23 appt noted; Fine now. Was reacting wife's stresses and manages better now. Less angy.  Less depressed.  No longer swinging with depression  and anxiety.   Better with mirtazapine in a couple  of days. No Se. Sleep is good except some awakening but brief.  Naps afternoon..  No mania. Planning a trip to [place] to see D. Son who will not recognize him.   Patient denies any recent difficulty with anxiety.  Patient denies difficulty with sleep initiation but 12 hours  Feels rested after sleep.. Denies appetite disturbance.  Patient reports that energy and motivation have been good.  Patient denies any difficulty with concentration.  Patient denies any suicidal ideation.  04/12/24 appt noted: Med : risperidone 1 mg HS, mirtazapine 15 HS, hydroxyzine prn itching and anxiety andtakes a couple daily. [facility] concerned about risperidone and heart.  Does't know a dx.  Not visisible in Epic Patient reports stable mood and denies depressed or irritable moods.  Patient denies any recent difficulty with anxiety.  Patient denies difficulty with sleep initiation or maintenance. Denies appetite disturbance.  Patient reports that energy and motivation have been good.  Patient denies any difficulty with concentration.  Patient denies any suicidal ideation. Good with meds witout concerns.  No SE   Past Psychiatric Medication Trials: Lithium, VPA, lamotrigine, olanzapine,  risperidone, Geodon,  clonazepam,  remote antidepressants? Trazodone 50  Review of Systems:  Review of Systems  Respiratory:  Positive for shortness of breath.   Cardiovascular:  Negative for chest pain.  Gastrointestinal:  Negative for vomiting.  Skin:        itching  Neurological:  Negative for  tremors.  Psychiatric/Behavioral:  Negative for dysphoric mood. The patient is not nervous/anxious.     Medications: I have reviewed the patient's current medications.  Current Outpatient Medications  Medication Sig Dispense Refill   acetaminophen (TYLENOL) 325 MG tablet Take 650 mg by mouth every 6 (six) hours as needed for mild pain.     allopurinol (ZYLOPRIM) 300 MG tablet Take 1 tablet (300 mg total) by mouth daily. 90 tablet 3   carvedilol (COREG) 12.5 MG tablet TAKE 1/2 (ONE-HALF) TABLET BY MOUTH IN THE MORNING AND 1/2 TABLET IN THE EVENING 90 tablet 1   carvedilol (COREG) 6.25 MG tablet Take 1 tablet (6.25 mg total) by mouth 2 (two) times daily with a meal. 180 tablet 3   clopidogrel (PLAVIX) 75 MG tablet Take 1 tablet (75 mg total) by mouth daily. 90 tablet 3   hydrOXYzine (ATARAX/VISTARIL) 25 MG tablet Take 50 mg by mouth at bedtime.     levothyroxine (SYNTHROID) 25 MCG tablet Take 1 tablet (25 mcg total) by mouth daily. 90 tablet 3   levothyroxine (SYNTHROID) 25 MCG tablet Take 1 tablet (25 mcg total) by mouth daily. MUST KEEP APPT FOR FURTHER REFILLS 30 tablet 0   Multiple Vitamin (MULTIVITAMIN WITH MINERALS) TABS tablet Take 1 tablet by mouth at bedtime.     mycophenolate (MYFORTIC) 180 MG EC tablet Take 180 mg by mouth 2 (two) times daily.     Omega-3 Fatty Acids (FISH OIL) 1000 MG CAPS Take 2,000 mg by mouth in the morning and at bedtime.     pantoprazole (PROTONIX) 40 MG tablet Take 1 tablet by mouth twice daily 180 tablet 0   PREVIDENT 5000 BOOSTER PLUS 1.1 % PSTE 3 (three) times daily.     risperiDONE (RISPERDAL) 1 MG tablet Take 2 mg by mouth at bedtime. Half of a 2mg  tab     simvastatin (ZOCOR) 20 MG tablet Take 1 tablet (20 mg total) by mouth daily. 90 tablet 3   sulfamethoxazole-trimethoprim (BACTRIM,SEPTRA) 400-80 MG tablet Take 1 tablet by mouth every Monday, Wednesday, and Friday. In the morning     tacrolimus (PROGRAF) 0.5 MG capsule Take 0.5-1 mg by mouth See admin  instructions. Take 2 capsules (1 mg) by mouth in the morning & take 1 capsule (0.5 mg) by mouth at night.     amoxicillin (AMOXIL) 500 MG capsule Take 500 mg by mouth as needed (for dental). (Patient not taking: Reported on 04/12/2024)     mirtazapine (REMERON) 15 MG tablet Take 1 tablet (15 mg total) by mouth at bedtime. 90 tablet 3   No current facility-administered medications for this visit.    Medication Side Effects: None  Allergies:  Allergies  Allergen Reactions   Amantadine Hcl Itching   Chlorpromazine Hcl Other (See Comments)    "fell flat on face"   Grapefruit Flavor [Flavoring Agent (Non-Screening)] Other (See Comments)    Can't take with medication    Past Medical History:  Diagnosis Date   Basal cell carcinoma    neck (skin MD 2X  per year)   Bifascicular block 2021   ectopy, asymptomatic.  Cards->observe   Bipolar disorder (HCC)    BPH (benign prostatic hyperplasia)    with hx of elevated PSA; followed by Annabell [facility], [facility] as of 2024   Chronic renal insufficiency, stage III (moderate) (HCC)    in pt w/hx of renal transplant.  Post-transplant sCr 1.4-1.6.   Coronary atherosclerosis    LM and 3 V dz noted on CT 08/2019 performed for eval of interstitial lung dz in the setting of mild DOE and mild hypoxia. Cards->no stress tesing indicated->primary prevention emphasized   CVA (cerebral vascular accident) (HCC) 11/2016   Pontine (vertebrobasilar--imaging neg), TPA given.  Pt discharged on plavix.  Carotid dopplers ok, echocardiogram ok.  Residual deficit: vertigo but this improved greatly with therapy.   DOE (dyspnea on exertion) 2020   DOE and ? hypoxia->CXR 08/29/19--->diffuse interstitial opacities-? acute inflammatory process->Dr. Sherene Sires eval'd him and was underwhelmed by the CXR and exam->CT chest 09/26/19 "Very subtle areas of mild ground-glass attenuatio-nonspecific", mild air trapping. Per Dr. Sherene Sires, no signif ILD, improving with inc ambulation (post-inflamm pulm  fibrosis?)   Elevated PSA    12/2022, mild, referred to urology   Esophagitis 06/14/2020   EGD->+distal esophagitis, h pylori neg, mild distal esoph stenosis widened with forceps, benign gastric polyps.   GERD (gastroesophageal reflux disease)    Gout    History of adenomatous polyp of colon 2018   Recall 5 yrs   Hyperlipidemia    Hypertension    Hypothyroidism    Lumbar spondylosis    Ptosis due to aging    Right   Rectal bleeding 09/2017   Admitted for obs due to Hb drop.  GI consulted---obs recommended.  No transfusion required.  Plavix was d/c'd, ferrous sulfate recommended.  Outpt GI f/u--plavix restarted and upper endoscopy was normal and colonoscopy showed 2 polyps and severe diverticular dz. Iron d/c'd 04/24/19.   Renal cyst    per pt he got MRI kidneys at the [facility] 12/2020 and the images will be viewed by Dr. Lacy Duverney   Restless legs syndrome    S/p cadaver renal transplant 2013   Secondary to HTN +lithium toxicity over 30 yrs caused kidney destruction [facility] transplant MDs)   Seborrheic dermatitis    eyebrows worst: Hytone rx'd by Dr. Jorja Loa.   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   left jawline(CX35FU), Left forehead (CX35FU) right shoulder (CX35FU), R forearm   Swallowing dysfunction    thin liquids (swallowing eval 01/2023)    Family History  Problem Relation Age of Onset   Ulcers Mother        GI bleed    Heart disease Father    Bladder Cancer Father    Colon cancer Neg Hx    Esophageal cancer Neg Hx    Pancreatic cancer Neg Hx    Rectal cancer Neg Hx    Stomach cancer Neg Hx     Social History   Socioeconomic History   Marital status: Married    Spouse name: Not on file   Number of children: 4   Years of education: Not on file   Highest education level: Not on file  Occupational History   Not on file  Tobacco Use   Smoking status: Former    Types: Pipe, Cigars    Quit date: 05/11/1974    Years since quitting: 49.9   Smokeless  tobacco: Never  Vaping Use   Vaping status: Never Used  Substance and Sexual Activity   Alcohol use: No   Drug use: No   Sexual activity: Not Currently    Partners: Female  Other Topics Concern   Not on file  Social History Narrative   Married, 4 children, 9 GGC, 2GGC.   Occupation: retired Dance movement psychotherapist.  Originally from [place].   Tobacco: quit 1975; smoked pipes and cigars x 15 yrs prior to this.   Alcohol: none.   Exercise: minimal, but is going to sign up for silver sneakers at the [facility].   Social Drivers of Corporate investment banker Strain: Low Risk  (06/09/2023)   Overall Financial Resource Strain (CARDIA)    Difficulty of Paying Living Expenses: Not hard at all  Food Insecurity: No Food Insecurity (06/09/2023)   Hunger Vital Sign    Worried About Running Out of Food in the Last Year: Never true    Ran Out of Food in the Last Year: Never true  Transportation Needs: No Transportation Needs (06/09/2023)   PRAPARE - Administrator, Civil Service (Medical): No    Lack of Transportation (Non-Medical): No  Physical Activity: Insufficiently Active (06/09/2023)   Exercise Vital Sign    Days of Exercise per Week: 2 days    Minutes of Exercise per Session: 60 min  Stress: No Stress Concern Present (06/09/2023)   [employer] of Occupational Health - Occupational Stress Questionnaire    Feeling of Stress : Not at all  Social Connections: Socially Integrated (06/09/2023)   Social Connection and Isolation Panel [NHANES]    Frequency of Communication with Friends and Family: Twice a week    Frequency of Social Gatherings with Friends and Family: More than three times a week    Attends Religious Services: More than 4 times per year    Active Member of [employer] or Organizations: Yes    Attends Banker Meetings: 1 to 4 times per year    Marital Status: Married  Catering manager Violence: Not At Risk (06/09/2023)   Humiliation, Afraid, Rape, and Kick  questionnaire    Fear of Current or Ex-Partner: No    Emotionally Abused: No    Physically Abused: No    Sexually Abused: No    Past Medical History, Surgical history, Social history, and Family history were reviewed and updated as appropriate.   Please see review of systems for further details on the patient's review from today.   Objective:   Physical Exam:  There were no vitals taken for this visit.  Physical Exam Constitutional:      General: He is not in acute distress.    Appearance: He is well-developed.  Musculoskeletal:        General: No deformity.  Neurological:     Mental Status: He is alert and oriented to person, place, and time.     Coordination: Coordination normal.  Psychiatric:        Attention and Perception: Attention and perception normal. He does not perceive auditory or visual hallucinations.        Mood and Affect: Mood is not anxious or depressed. Affect is not labile, blunt, angry or inappropriate.        Speech: Speech normal. Speech is not rapid and pressured.        Behavior: Behavior normal.        Thought Content: Thought content normal. Thought content does not include homicidal or suicidal ideation.        Cognition and Memory: Cognition and memory  normal.        Judgment: Judgment normal.     Comments: Insight intact. No delusions.  No mania. No AIM. Mood better with mirtazapine Stable over the last year     Lab Review:     Component Value Date/Time   NA 140 09/24/2023 0934   NA 139 05/19/2023 0000   K 4.4 09/24/2023 0934   CL 106 09/24/2023 0934   CO2 26 09/24/2023 0934   GLUCOSE 93 09/24/2023 0934   GLUCOSE 103 (H) 12/15/2006 1045   BUN 23 09/24/2023 0934   BUN 33 (A) 05/19/2023 0000   CREATININE 1.48 (H) 09/24/2023 0934   CALCIUM 9.3 09/24/2023 0934   CALCIUM 9.5 12/06/2010 0046   PROT 5.9 (L) 01/22/2023 1001   ALBUMIN 3.7 05/19/2023 0000   AST 14 05/19/2023 0000   ALT 14 05/19/2023 0000   ALKPHOS 72 05/19/2023 0000    BILITOT 0.3 01/22/2023 1001   GFRNONAA 47 12/03/2021 0000   GFRNONAA 45 (L) 08/07/2021 1341   GFRAA 38 11/28/2020 0000       Component Value Date/Time   WBC 5.1 05/19/2023 0000   WBC 4.9 09/22/2022 1009   RBC 4.80 05/19/2023 0000   HGB 14.0 05/19/2023 0000   HCT 42 05/19/2023 0000   PLT 207 05/19/2023 0000   MCV 89.6 09/22/2022 1009   MCH 28.2 08/07/2021 1341   MCHC 32.9 09/22/2022 1009   RDW 14.9 09/22/2022 1009   LYMPHSABS 0.8 06/04/2021 0933   MONOABS 0.7 06/04/2021 0933   EOSABS 0.1 06/04/2021 0933   BASOSABS 0.1 06/04/2021 0933    No results found for: "POCLITH", "LITHIUM"   No results found for: "PHENYTOIN", "PHENOBARB", "VALPROATE", "CBMZ"   .res Assessment: Plan:    Kesley Gaffey" was seen today for follow-up.  Diagnoses and all orders for this visit:  Bipolar I disorder (HCC) -     mirtazapine (REMERON) 15 MG tablet; Take 1 tablet (15 mg total) by mouth at bedtime.  Insomnia due to mental condition -     mirtazapine (REMERON) 15 MG tablet; Take 1 tablet (15 mg total) by mouth at bedtime.    30 min appt. Patient has a history of kidney transplant.  He has been stable on risperidone for several years for bipolar disorder.  He has had previous manic episodes but none in several years.  We have reduced the risperidone to the lowest effective dose.  As noted he has been on alternatives.  He is satisfied with the current meds.  Discussed potential metabolic side effects associated with atypical antipsychotics, as well as potential risk for movement side effects. Advised pt to contact office if movement side effects occur.   Disc cardiac risk atypicals but also no options of mood stabilizers left that aren't atypicals.   Continue risperidone 1 mg HS benefit mirtazapine 15 mg for depression and anxiety if persists  Extensive disc of sleep hygiene.  In bed way too much to sleep that much. Really doesn't have insomnia bc of this.  FU 12 mos bc stable.  Meredith Staggers, MD, DFAPA  Please see After Visit Summary for patient specific instructions.  Future Appointments  Date Time Provider Department Center  06/14/2024  2:30 PM [facility] ANNUAL WELLNESS VISIT [facility]  10/06/2024  8:40 AM McGowen, Maryjean Morn, MD [facility]    No orders of the defined types were placed in this encounter.   -------------------------------

## 2024-04-26 DIAGNOSIS — D0439 Carcinoma in situ of skin of other parts of face: Secondary | ICD-10-CM | POA: Diagnosis not present

## 2024-07-04 DIAGNOSIS — I129 Hypertensive chronic kidney disease with stage 1 through stage 4 chronic kidney disease, or unspecified chronic kidney disease: Secondary | ICD-10-CM | POA: Diagnosis not present

## 2024-07-04 DIAGNOSIS — R739 Hyperglycemia, unspecified: Secondary | ICD-10-CM | POA: Diagnosis not present

## 2024-07-04 DIAGNOSIS — E669 Obesity, unspecified: Secondary | ICD-10-CM | POA: Diagnosis not present

## 2024-07-04 DIAGNOSIS — Z940 Kidney transplant status: Secondary | ICD-10-CM | POA: Diagnosis not present

## 2024-07-04 DIAGNOSIS — M109 Gout, unspecified: Secondary | ICD-10-CM | POA: Diagnosis not present

## 2024-07-04 DIAGNOSIS — R0902 Hypoxemia: Secondary | ICD-10-CM | POA: Diagnosis not present

## 2024-07-04 DIAGNOSIS — M7989 Other specified soft tissue disorders: Secondary | ICD-10-CM | POA: Diagnosis not present

## 2024-07-04 DIAGNOSIS — K922 Gastrointestinal hemorrhage, unspecified: Secondary | ICD-10-CM | POA: Diagnosis not present

## 2024-07-04 LAB — COMPREHENSIVE METABOLIC PANEL WITH GFR
Albumin: 4.1 (ref 3.5–5.0)
Calcium: 9.1 (ref 8.7–10.7)
Globulin: 1.9
eGFR: 37

## 2024-07-04 LAB — BASIC METABOLIC PANEL WITH GFR
BUN: 20 (ref 4–21)
CO2: 26 — AB (ref 13–22)
Chloride: 107 (ref 99–108)
Creatinine: 1.8 — AB (ref 0.6–1.3)
Glucose: 105
Potassium: 4.4 meq/L (ref 3.5–5.1)
Sodium: 142 (ref 137–147)

## 2024-07-04 LAB — CBC: RBC: 4.83 (ref 3.87–5.11)

## 2024-07-04 LAB — CBC AND DIFFERENTIAL
HCT: 44 (ref 41–53)
Hemoglobin: 14.2 (ref 13.5–17.5)
Neutrophils Absolute: 3.8
Platelets: 185 K/uL (ref 150–400)
WBC: 5.5

## 2024-07-05 ENCOUNTER — Ambulatory Visit: Admitting: *Deleted

## 2024-07-05 VITALS — Ht 69.0 in | Wt 188.0 lb

## 2024-07-05 DIAGNOSIS — Z0000 Encounter for general adult medical examination without abnormal findings: Secondary | ICD-10-CM | POA: Diagnosis not present

## 2024-07-05 NOTE — Patient Instructions (Signed)
 [name] , Thank you for taking time to come for your Medicare Wellness Visit. I appreciate your ongoing commitment to your health goals. Please review the following plan we discussed and let me know if I can assist you in the future.   Screening recommendations/referrals: Colonoscopy: no longer required Recommended yearly ophthalmology/optometry visit for glaucoma screening and checkup Recommended yearly dental visit for hygiene and checkup  Vaccinations: Influenza vaccine: up to date Pneumococcal vaccine: up to date Tdap vaccine: Education provided Shingles vaccine: up tod ate      Preventive Care 65 Years and Older, Male Preventive care refers to lifestyle choices and visits with your health care provider that can promote health and wellness. What does preventive care include? A yearly physical exam. This is also called an annual well check. Dental exams once or twice a year. Routine eye exams. Ask your health care provider how often you should have your eyes checked. Personal lifestyle choices, including: Daily care of your teeth and gums. Regular physical activity. Eating a healthy diet. Avoiding tobacco and drug use. Limiting alcohol use. Practicing safe sex. Taking low doses of aspirin every day. Taking vitamin and mineral supplements as recommended by your health care provider. What happens during an annual well check? The services and screenings done by your health care provider during your annual well check will depend on your age, overall health, lifestyle risk factors, and family history of disease. Counseling  Your health care provider may ask you questions about your: Alcohol use. Tobacco use. Drug use. Emotional well-being. Home and relationship well-being. Sexual activity. Eating habits. History of falls. Memory and ability to understand (cognition). Work and work Astronomer. Screening  You may have the following tests or measurements: Height,  weight, and BMI. Blood pressure. Lipid and cholesterol levels. These may be checked every 5 years, or more frequently if you are over 57 years old. Skin check. Lung cancer screening. You may have this screening every year starting at age 44 if you have a 30-pack-year history of smoking and currently smoke or have quit within the past 15 years. Fecal occult blood test (FOBT) of the stool. You may have this test every year starting at age 13. Flexible sigmoidoscopy or colonoscopy. You may have a sigmoidoscopy every 5 years or a colonoscopy every 10 years starting at age 71. Prostate cancer screening. Recommendations will vary depending on your family history and other risks. Hepatitis C blood test. Hepatitis B blood test. Sexually transmitted disease (STD) testing. Diabetes screening. This is done by checking your blood sugar (glucose) after you have not eaten for a while (fasting). You may have this done every 1-3 years. Abdominal aortic aneurysm (AAA) screening. You may need this if you are a current or former smoker. Osteoporosis. You may be screened starting at age 28 if you are at high risk. Talk with your health care provider about your test results, treatment options, and if necessary, the need for more tests. Vaccines  Your health care provider may recommend certain vaccines, such as: Influenza vaccine. This is recommended every year. Tetanus, diphtheria, and acellular pertussis (Tdap, Td) vaccine. You may need a Td booster every 10 years. Zoster vaccine. You may need this after age 91. Pneumococcal 13-valent conjugate (PCV13) vaccine. One dose is recommended after age 9. Pneumococcal polysaccharide (PPSV23) vaccine. One dose is recommended after age 49. Talk to your health care provider about which screenings and vaccines you need and how often you need them. This information is not intended to  replace advice given to you by your health care provider. Make sure you discuss any  questions you have with your health care provider. Document Released: 01/10/2016 Document Revised: 09/02/2016 Document Reviewed: 10/15/2015 Elsevier Interactive Patient Education  2017 [employer].  Fall Prevention in the Home Falls can cause injuries. They can happen to people of all ages. There are many things you can do to make your home safe and to help prevent falls. What can I do on the outside of my home? Regularly fix the edges of walkways and driveways and fix any cracks. Remove anything that might make you trip as you walk through a door, such as a raised step or threshold. Trim any bushes or trees on the path to your home. Use bright outdoor lighting. Clear any walking paths of anything that might make someone trip, such as rocks or tools. Regularly check to see if handrails are loose or broken. Make sure that both sides of any steps have handrails. Any raised decks and porches should have guardrails on the edges. Have any leaves, snow, or ice cleared regularly. Use sand or salt on walking paths during winter. Clean up any spills in your garage right away. This includes oil or grease spills. What can I do in the bathroom? Use night lights. Install grab bars by the toilet and in the tub and shower. Do not use towel bars as grab bars. Use non-skid mats or decals in the tub or shower. If you need to sit down in the shower, use a plastic, non-slip stool. Keep the floor dry. Clean up any water that spills on the floor as soon as it happens. Remove soap buildup in the tub or shower regularly. Attach bath mats securely with double-sided non-slip rug tape. Do not have throw rugs and other things on the floor that can make you trip. What can I do in the bedroom? Use night lights. Make sure that you have a light by your bed that is easy to reach. Do not use any sheets or blankets that are too big for your bed. They should not hang down onto the floor. Have a firm chair that has side  arms. You can use this for support while you get dressed. Do not have throw rugs and other things on the floor that can make you trip. What can I do in the kitchen? Clean up any spills right away. Avoid walking on wet floors. Keep items that you use a lot in easy-to-reach places. If you need to reach something above you, use a strong step stool that has a grab bar. Keep electrical cords out of the way. Do not use floor polish or wax that makes floors slippery. If you must use wax, use non-skid floor wax. Do not have throw rugs and other things on the floor that can make you trip. What can I do with my stairs? Do not leave any items on the stairs. Make sure that there are handrails on both sides of the stairs and use them. Fix handrails that are broken or loose. Make sure that handrails are as long as the stairways. Check any carpeting to make sure that it is firmly attached to the stairs. Fix any carpet that is loose or worn. Avoid having throw rugs at the top or bottom of the stairs. If you do have throw rugs, attach them to the floor with carpet tape. Make sure that you have a light switch at the top of the stairs and the  bottom of the stairs. If you do not have them, ask someone to add them for you. What else can I do to help prevent falls? Wear shoes that: Do not have high heels. Have rubber bottoms. Are comfortable and fit you well. Are closed at the toe. Do not wear sandals. If you use a stepladder: Make sure that it is fully opened. Do not climb a closed stepladder. Make sure that both sides of the stepladder are locked into place. Ask someone to hold it for you, if possible. Clearly mark and make sure that you can see: Any grab bars or handrails. First and last steps. Where the edge of each step is. Use tools that help you move around (mobility aids) if they are needed. These include: Canes. Walkers. Scooters. Crutches. Turn on the lights when you go into a dark area.  Replace any light bulbs as soon as they burn out. Set up your furniture so you have a clear path. Avoid moving your furniture around. If any of your floors are uneven, fix them. If there are any pets around you, be aware of where they are. Review your medicines with your doctor. Some medicines can make you feel dizzy. This can increase your chance of falling. Ask your doctor what other things that you can do to help prevent falls. This information is not intended to replace advice given to you by your health care provider. Make sure you discuss any questions you have with your health care provider. Document Released: 10/10/2009 Document Revised: 05/21/2016 Document Reviewed: 01/18/2015 Elsevier Interactive Patient Education  2017 [employer].

## 2024-07-05 NOTE — Progress Notes (Signed)
 Subjective:   Curtis Jimenez is a 88 y.o. male who presents for Medicare Annual/Subsequent preventive examination.  Visit Complete: Virtual I connected with  Nancyann JAYSON Neat on 07/05/24 by a audio enabled telemedicine application and verified that I am speaking with the correct person using two identifiers.  Patient Location: Home  Provider Location: Home Office  I discussed the limitations of evaluation and management by telemedicine. The patient expressed understanding and agreed to proceed.  Vital Signs: Because this visit was a virtual/telehealth visit, some criteria may be missing or patient reported. Any vitals not documented were not able to be obtained and vitals that have been documented are patient reported.   Cardiac Risk Factors include: hypertension;male gender;advanced age (>42men, >50 women);obesity (BMI >30kg/m2)     Objective:    Today's Vitals   07/05/24 0945  Weight: 188 lb (85.3 kg)  Height: 5' 9 (1.753 m)   Body mass index is 27.76 kg/m.     07/05/2024    9:42 AM 06/09/2023    2:41 PM 06/03/2022    2:50 PM 10/21/2021    6:10 AM 08/18/2021   10:55 AM 08/07/2021    1:20 PM 05/28/2021    2:55 PM  Advanced Directives  Does Patient Have a Medical Advance Directive? Yes Yes Yes Yes Yes Yes Yes  Type of Estate agent of [employer] Power of [place];Living will Healthcare Power of [employer] of [place];Living will Healthcare Power of [place];Living will Healthcare Power of [place];Living will Healthcare Power of [place];Living will  Does patient want to make changes to medical advance directive?  No - Patient declined  No - Patient declined No - Patient declined    Copy of Healthcare Power of Attorney in Chart? No - copy requested Yes - validated most recent copy scanned in chart (See row information) Yes - validated most recent copy scanned in chart (See row information)    Yes - validated most recent copy scanned  in chart (See row information)    Current Medications (verified) Outpatient Encounter Medications as of 07/05/2024  Medication Sig   acetaminophen  (TYLENOL ) 325 MG tablet Take 650 mg by mouth every 6 (six) hours as needed for mild pain.   allopurinol  (ZYLOPRIM ) 300 MG tablet Take 1 tablet (300 mg total) by mouth daily.   carvedilol  (COREG ) 12.5 MG tablet TAKE 1/2 (ONE-HALF) TABLET BY MOUTH IN THE MORNING AND 1/2 TABLET IN THE EVENING   carvedilol  (COREG ) 6.25 MG tablet Take 1 tablet (6.25 mg total) by mouth 2 (two) times daily with a meal.   clopidogrel  (PLAVIX ) 75 MG tablet Take 1 tablet (75 mg total) by mouth daily.   hydrOXYzine  (ATARAX /VISTARIL ) 25 MG tablet Take 50 mg by mouth at bedtime.   levothyroxine  (SYNTHROID ) 25 MCG tablet Take 1 tablet (25 mcg total) by mouth daily.   levothyroxine  (SYNTHROID ) 25 MCG tablet Take 1 tablet (25 mcg total) by mouth daily. MUST KEEP APPT FOR FURTHER REFILLS   mirtazapine  (REMERON ) 15 MG tablet Take 1 tablet (15 mg total) by mouth at bedtime.   mycophenolate  (MYFORTIC ) 180 MG EC tablet Take 180 mg by mouth 2 (two) times daily. (Patient taking differently: Take 180 mg by mouth daily. Taking once a day)   Omega-3 Fatty Acids (FISH OIL) 1000 MG CAPS Take 2,000 mg by mouth in the morning and at bedtime.   pantoprazole  (PROTONIX ) 40 MG tablet Take 1 tablet by mouth twice daily   PREVIDENT 5000 BOOSTER PLUS 1.1 % PSTE  3 (three) times daily.   risperiDONE  (RISPERDAL ) 1 MG tablet Take 2 mg by mouth at bedtime. Half of a 2mg  tab   simvastatin  (ZOCOR ) 20 MG tablet Take 1 tablet (20 mg total) by mouth daily.   sulfamethoxazole -trimethoprim  (BACTRIM ,SEPTRA ) 400-80 MG tablet Take 1 tablet by mouth every Monday, Wednesday, and Friday. In the morning   tacrolimus  (PROGRAF ) 0.5 MG capsule Take 0.5-1 mg by mouth See admin instructions. Take 2 capsules (1 mg) by mouth in the morning & take 1 capsule (0.5 mg) by mouth at night.   amoxicillin  (AMOXIL ) 500 MG capsule Take 500  mg by mouth as needed (for dental). (Patient not taking: Reported on 04/12/2024)   Multiple Vitamin (MULTIVITAMIN WITH MINERALS) TABS tablet Take 1 tablet by mouth at bedtime.   No facility-administered encounter medications on file as of 07/05/2024.    Allergies (verified) Amantadine hcl, Chlorpromazine hcl, and Grapefruit flavor [flavoring agent (non-screening)]   History: Past Medical History:  Diagnosis Date   Basal cell carcinoma    neck (skin MD 2X per year)   Bifascicular block 2021   ectopy, asymptomatic.  Cards->observe   Bipolar disorder (HCC)    BPH (benign prostatic hyperplasia)    with hx of elevated PSA; [facility] as of 2024   Chronic renal insufficiency, stage III (moderate) (HCC)    in pt w/hx of renal transplant.  Post-transplant sCr 1.4-1.6.   Coronary atherosclerosis    LM and 3 V dz noted on CT 08/2019 performed for eval of interstitial lung dz in the setting of mild DOE and mild hypoxia. Cards->no stress tesing indicated->primary prevention emphasized   CVA (cerebral vascular accident) (HCC) 11/2016   Pontine (vertebrobasilar--imaging neg), TPA given.  Pt discharged on plavix .  Carotid dopplers ok, echocardiogram ok.  Residual deficit: vertigo but this improved greatly with therapy.   DOE (dyspnea on exertion) 2020   DOE and ? hypoxia->CXR 08/29/19--->diffuse interstitial opacities-? acute inflammatory process->Dr. Darlean eval'd him and was underwhelmed by the CXR and exam->CT chest 09/26/19 Very subtle areas of mild ground-glass attenuatio-nonspecific, mild air trapping. Per Dr. Darlean, no signif ILD, improving with inc ambulation (post-inflamm pulm fibrosis?)   Elevated PSA    12/2022, mild, referred to urology   Esophagitis 06/14/2020   EGD->+distal esophagitis, h pylori neg, mild distal esoph stenosis widened with forceps, benign gastric polyps.   GERD (gastroesophageal reflux disease)    Gout    History of adenomatous polyp of colon 2018    Recall 5 yrs   Hyperlipidemia    Hypertension    Hypothyroidism    Lumbar spondylosis    Ptosis due to aging    Right   Rectal bleeding 09/2017   Admitted for obs due to Hb drop.  GI consulted---obs recommended.  No transfusion required.  Plavix  was d/c'd, ferrous sulfate  recommended.  Outpt GI f/u--plavix  restarted and upper endoscopy was normal and colonoscopy showed 2 polyps and severe diverticular dz. Iron d/c'd 04/24/19.   Renal cyst    per pt he got MRI kidneys [facility] 12/2020 and the images will be viewed by Dr. Clayborn   Restless legs syndrome    S/p cadaver renal transplant 2013   Secondary to HTN +lithium toxicity over 30 yrs caused kidney destruction [facility] transplant MDs)   Seborrheic dermatitis    eyebrows worst: Hytone  rx'd by Dr. Livingston.   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   left jawline(CX35FU), Left forehead (CX35FU) right shoulder (CX35FU), R  forearm   Swallowing dysfunction    thin liquids (swallowing eval 01/2023)   Past Surgical History:  Procedure Laterality Date   AV FISTULA PLACEMENT  04/08/2012   Procedure: ARTERIOVENOUS (AV) FISTULA CREATION;  Surgeon: Lonni GORMAN Blade, MD;  Location: [facility] OR;  Service: Vascular;  Laterality: Left;  Creation of left brachial cephalic arteriovenous fistula   BIOPSY  06/14/2020   Procedure: BIOPSY;  Surgeon: San Sandor GAILS, DO;  Location: [facility];  Service: Gastroenterology;;   CATARACT EXTRACTION, BILATERAL  05/2018   CHOLECYSTECTOMY N/A 08/18/2021   Procedure: LAPAROSCOPIC CHOLECYSTECTOMY WITH CHOLANGIOGRAM;  Surgeon: Eletha Boas, MD;  Location: [facility] ORS;  Service: General;  Laterality: N/A;   COLONOSCOPY  2013; 11/24/17   2013; Normal except diverticulosis (recall 10 yrs).  2018 (for GI bleeding)--severe diverticular dz + 2 adenomatous polyps.  Recall 5 yrs.   DG CHEST AP OR PA ([facility] HX)  06/03/2020   Bibasilar atelectasis. No focal consoliadation definitively identified.   dilation for  GERD     ESOPHAGOGASTRODUODENOSCOPY  11/24/2017   gastric polyps x 2, otherwise normal.   ESOPHAGOGASTRODUODENOSCOPY N/A 06/14/2020   +distal esophagitis, h pylori neg, mild distal esoph stenosis widened with forceps, benign gastric polypsProcedure: ESOPHAGOGASTRODUODENOSCOPY (EGD);  Surgeon: San Sandor GAILS, DO;  Location: [facility];  Service: Gastroenterology;  Laterality: N/A;  Diagnostic EGD as patietn last took Plavix  11am on 06/12/2020   KIDNEY TRANSPLANT  11/06/2012   [facility] (cadaver[facility])   LIGATION OF ARTERIOVENOUS  FISTULA Left 10/21/2021   Procedure: LIGATION OF LEFT ARTERIOVENOUS  FISTULA;  Surgeon: Blade Lonni GORMAN, MD;  Location: [facility] OR;  Service: Vascular;  Laterality: Left;   PROSTATE BIOPSY  2011   no malignancy   TRANSTHORACIC ECHOCARDIOGRAM  11/2016   Normal LV systolic fxn, EF 44-39%.  Grade I DD.  Mild aortic root dilatation, mild MV regurg.   Virtual colonoscopy     07/2023 NO POLYPS   Family History  Problem Relation Age of Onset   Ulcers Mother        GI bleed    Heart disease Father    Bladder Cancer Father    Colon cancer Neg Hx    Esophageal cancer Neg Hx    Pancreatic cancer Neg Hx    Rectal cancer Neg Hx    Stomach cancer Neg Hx    Social History   Socioeconomic History   Marital status: Married    Spouse name: Not on file   Number of children: 4   Years of education: Not on file   Highest education level: Not on file  Occupational History   Not on file  Tobacco Use   Smoking status: Former    Types: Pipe, Cigars    Quit date: 05/11/1974    Years since quitting: 50.1   Smokeless tobacco: Never  Vaping Use   Vaping status: Never Used  Substance and Sexual Activity   Alcohol use: No   Drug use: No   Sexual activity: Not Currently    Partners: Female  Other Topics Concern   Not on file  Social History Narrative   Married, 4 children, 9 GGC, 2GGC.   Occupation: retired Dance movement psychotherapist.  Originally from [place].   Tobacco: quit 1975;  smoked pipes and cigars x 15 yrs prior to this.   Alcohol: none.   Exercise: minimal, but is going to sign up for silver sneakers at the [facility].   Social Drivers of Health   Financial Resource Strain: Low Risk  (07/05/2024)  Overall Financial Resource Strain (CARDIA)    Difficulty of Paying Living Expenses: Not hard at all  Food Insecurity: No Food Insecurity (07/05/2024)   Hunger Vital Sign    Worried About Running Out of Food in the Last Year: Never true    Ran Out of Food in the Last Year: Never true  Transportation Needs: No Transportation Needs (07/05/2024)   PRAPARE - Administrator, Civil Service (Medical): No    Lack of Transportation (Non-Medical): No  Physical Activity: Insufficiently Active (07/05/2024)   Exercise Vital Sign    Days of Exercise per Week: 2 days    Minutes of Exercise per Session: 20 min  Stress: No Stress Concern Present (07/05/2024)   [employer] of Occupational Health - Occupational Stress Questionnaire    Feeling of Stress: Not at all  Social Connections: Socially Integrated (07/05/2024)   Social Connection and Isolation Panel    Frequency of Communication with Friends and Family: Once a week    Frequency of Social Gatherings with Friends and Family: Twice a week    Attends Religious Services: More than 4 times per year    Active Member of [employer] or Organizations: Yes    Attends Engineer, structural: More than 4 times per year    Marital Status: Married    Tobacco Counseling Counseling given: Not Answered   Clinical Intake:  Pre-visit preparation completed: Yes  Pain : No/denies pain     Diabetes: No  How often do you need to have someone help you when you read instructions, pamphlets, or other written materials from your doctor or pharmacy?: 1 - Never  Interpreter Needed?: No  Information entered by :: Mliss Graff LPN   Activities of Daily Living    07/05/2024    9:41 AM  In your present state of health, do you have  any difficulty performing the following activities:  Hearing? 0  Vision? 0  Difficulty concentrating or making decisions? 0  Walking or climbing stairs? 1  Dressing or bathing? 0  Doing errands, shopping? 0  Preparing Food and eating ? N  Using the Toilet? N  In the past six months, have you accidently leaked urine? N  Do you have problems with loss of bowel control? N  Managing your Medications? N  Managing your Finances? N  Housekeeping or managing your Housekeeping? N    Patient Care Team: Candise Aleene DEL, MD as PCP - General (Family Medicine) Cottle, Lorene KANDICE Raddle., MD as Consulting Physician (Psychiatry) Eliza Lonni RAMAN, MD (Inactive) as Consulting Physician (Vascular Surgery) Geraldene Loving, MD as Consulting Physician (Ophthalmology) Skeet Juliene SAUNDERS, DO as Consulting Physician (Neurology) Shila Gustav GAILS, MD as Consulting Physician (Gastroenterology) Prescilla Beams, MD as Consulting Physician (Nephrology) Livingston Rigg, MD (Inactive) as Consulting Physician (Dermatology) Darlean Ozell NOVAK, MD as Consulting Physician (Pulmonary Disease) Lavona Agent, MD as Consulting Physician (Cardiology) Eletha Boas, MD as Consulting Physician (General Surgery) Watt Rush, MD as Consulting Physician (Urology) Watt Rush, MD as Consulting Physician (Urology)  Indicate any recent Medical Services you may have received from other than [facility] providers in the past year (date may be approximate).     Assessment:   This is a routine wellness examination for Curtis Jimenez.  Hearing/Vision screen Hearing Screening - Comments:: No trouble hearing Vision Screening - Comments:: Up to date [facility]   Goals Addressed             This Visit's Progress    Patient Stated  On track    Stay healthy      Patient Stated   On track    Stay healthy      Patient Stated       Be able to walk a little further       Depression Screen    07/05/2024    9:45 AM 04/06/2024    10:21 AM 09/24/2023    9:08 AM 06/09/2023    2:39 PM 05/26/2023    9:52 AM 01/22/2023    9:39 AM 06/03/2022    2:49 PM  PHQ 2/9 Scores  PHQ - 2 Score 0 0 0 0 0 0 0  PHQ- 9 Score 4   0 0      Fall Risk    07/05/2024    9:37 AM 09/24/2023    9:08 AM 06/09/2023    2:41 PM 05/26/2023    9:52 AM 01/22/2023    9:39 AM  Fall Risk   Falls in the past year? 1 0 0 0 0  Number falls in past yr: 1 0 0  1  Injury with Fall? 0 0 0  0  Risk for fall due to :  No Fall Risks Impaired mobility;Impaired vision;Impaired balance/gait  No Fall Risks  Follow up Falls evaluation completed;Education provided;Falls prevention discussed Falls evaluation completed Falls prevention discussed  Falls evaluation completed    MEDICARE RISK AT HOME: Medicare Risk at Home If so, are there any without handrails?: No Home free of loose throw rugs in walkways, pet beds, electrical cords, etc?: Yes Adequate lighting in your home to reduce risk of falls?: Yes Life alert?: No Use of a cane, walker or w/c?: No Grab bars in the bathroom?: Yes Shower chair or bench in shower?: Yes Elevated toilet seat or a handicapped toilet?: Yes  TIMED UP AND GO:  Was the test performed?  No    Cognitive Function:        07/05/2024    9:42 AM 06/09/2023    2:42 PM 06/03/2022    2:53 PM  6CIT Screen  What Year? 0 points 0 points 0 points  What month? 0 points 0 points 0 points  What time? 0 points 0 points 0 points  Count back from 20 2 points 0 points 2 points  Months in reverse 0 points 0 points 0 points  Repeat phrase 0 points 0 points 0 points  Total Score 2 points 0 points 2 points    Immunizations Immunization History  Administered Date(s) Administered   Fluad Quad(high Dose 65+) 09/15/2019, 09/26/2020, 10/26/2020, 09/30/2021, 09/22/2022   Hepatitis A, Adult 08/14/2016, 05/31/2017   Hepatitis B 02/15/2012, 03/14/2012, 08/15/2012   Influenza Split 09/27/2012   Influenza Whole 12/16/2009, 09/18/2010, 09/28/2011    Influenza, High Dose Seasonal PF 11/04/2016, 11/05/2017, 09/13/2018, 09/26/2023   Influenza-Unspecified 09/27/2013   Moderna Covid-19 Fall Seasonal Vaccine 33yrs & older 04/16/2023   PFIZER(Purple Top)SARS-COV-2 Vaccination 01/08/2020, 02/01/2020, 08/17/2020, 04/01/2021   PPD Test 06/27/2012   Pfizer Covid-19 Vaccine Bivalent Booster 64yrs & up 12/10/2021, 04/22/2022   Pfizer(Comirnaty)Fall Seasonal Vaccine 12 years and older 09/28/2022, 09/26/2023, 05/20/2024   Pneumococcal Conjugate-13 07/19/2015, 09/26/2020   Pneumococcal Polysaccharide-23 12/29/2003   Pneumococcal-Unspecified 07/06/2002   Respiratory Syncytial Virus Vaccine,Recomb Aduvanted(Arexvy) 10/28/2022   Td 12/29/2003   Tdap 09/27/2012, 04/27/2014   Typhoid Inactivated 08/14/2016, 10/08/2022   Zoster Recombinant(Shingrix ) 05/24/2021, 09/15/2021   Zoster, Live 04/18/2008    TDAP status: Due, Education has been provided regarding the importance of this vaccine. Advised may  receive this vaccine at local pharmacy or Health Dept. Aware to provide a copy of the vaccination record if obtained from local pharmacy or Health Dept. Verbalized acceptance and understanding.  Flu Vaccine status: Up to date  Pneumococcal vaccine status: Up to date  Covid-19 vaccine status: Information provided on how to obtain vaccines.   Qualifies for Shingles Vaccine? No   Zostavax completed Yes   Shingrix  Completed?: Yes  Screening Tests Health Maintenance  Topic Date Due   DTaP/Tdap/Td (4 - Td or Tdap) 04/27/2024   COVID-19 Vaccine (11 - 2024-25 season) 07/15/2024   INFLUENZA VACCINE  07/28/2024   Medicare Annual Wellness (AWV)  07/05/2025   Pneumococcal Vaccine: 50+ Years  Completed   Hepatitis B Vaccines  Completed   Zoster Vaccines- Shingrix   Completed   HPV VACCINES  Aged Out   Meningococcal B Vaccine  Aged Out    Health Maintenance  Health Maintenance Due  Topic Date Due   DTaP/Tdap/Td (4 - Td or Tdap) 04/27/2024    Colorectal  cancer screening: No longer required.   Lung Cancer Screening: (Low Dose CT Chest recommended if Age 25-80 years, 20 pack-year currently smoking OR have quit w/in 15years.) does not qualify.   Lung Cancer Screening Referral:   Additional Screening:  Hepatitis C Screening:  never done  Vision Screening: Recommended annual ophthalmology exams for early detection of glaucoma and other disorders of the eye. Is the patient up to date with their annual eye exam?  Yes  Who is the provider or what is the name of the office in which the patient attends annual eye exams? [facility] If pt is not established with a provider, would they like to be referred to a provider to establish care? No .   Dental Screening: Recommended annual dental exams for proper oral hygiene   Community Resource Referral / Chronic Care Management: CRR required this visit?  No   CCM required this visit?  No     Plan:     I have personally reviewed and noted the following in the patient's chart:   Medical and social history Use of alcohol, tobacco or illicit drugs  Current medications and supplements including opioid prescriptions. Patient is not currently taking opioid prescriptions. Functional ability and status Nutritional status Physical activity Advanced directives List of other physicians Hospitalizations, surgeries, and ER visits in previous 12 months Vitals Screenings to include cognitive, depression, and falls Referrals and appointments  In addition, I have reviewed and discussed with patient certain preventive protocols, quality metrics, and best practice recommendations. A written personalized care plan for preventive services as well as general preventive health recommendations were provided to patient.     Mliss Graff, LPN   2/0/7974   After Visit Summary: (MyChart) Due to this being a telephonic visit, the after visit summary with patients personalized plan was offered to patient via MyChart    Nurse Notes:

## 2024-07-14 DIAGNOSIS — R3912 Poor urinary stream: Secondary | ICD-10-CM | POA: Diagnosis not present

## 2024-07-14 DIAGNOSIS — N471 Phimosis: Secondary | ICD-10-CM | POA: Diagnosis not present

## 2024-07-14 DIAGNOSIS — N401 Enlarged prostate with lower urinary tract symptoms: Secondary | ICD-10-CM | POA: Diagnosis not present

## 2024-07-14 DIAGNOSIS — R972 Elevated prostate specific antigen [PSA]: Secondary | ICD-10-CM | POA: Diagnosis not present

## 2024-08-07 ENCOUNTER — Encounter: Payer: Self-pay | Admitting: Pharmacist

## 2024-08-07 NOTE — Progress Notes (Signed)
 Pharmacy Quality Measure Review  This patient is appearing on a report for being at risk of failing the adherence measure for cholesterol (statin) medications this calendar year.   Medication: simvastatin  20 mg Last fill date: 8/4 for 90 day supply  Insurance report was not up to date. No action needed at this time.   Catie IVAR Centers, PharmD, [facility] Clinical Pharmacist [phone]

## 2024-08-14 ENCOUNTER — Encounter: Payer: Self-pay | Admitting: Pharmacist

## 2024-08-14 NOTE — Progress Notes (Signed)
 Pharmacy Quality Measure Review  This patient is appearing on a report for being at risk of failing the adherence measure for cholesterol (statin) medications this calendar year.   Medication: simvastatin  20mg  Last fill date: 03/13/2024 for 90 day supply  Reviewed recent refill history in Dr Annemarie database. Actual last refill date was 07/31/2024 for 90 day supply. Patient has 2 refills remaining. Next appointment with PCP is 10/10/2024.    Insurance report was not up to date. No action needed at this time.   Madelin Ray, PharmD Clinical Pharmacist [facility]  Population Health [phone]

## 2024-08-30 ENCOUNTER — Emergency Department (HOSPITAL_COMMUNITY)

## 2024-08-30 ENCOUNTER — Ambulatory Visit: Payer: Self-pay

## 2024-08-30 ENCOUNTER — Emergency Department (HOSPITAL_COMMUNITY): Admission: EM | Admit: 2024-08-30 | Discharge: 2024-08-30 | Disposition: A | Discharge status: Home / Self Care

## 2024-08-30 ENCOUNTER — Other Ambulatory Visit: Payer: Self-pay

## 2024-08-30 DIAGNOSIS — Z940 Kidney transplant status: Secondary | ICD-10-CM | POA: Insufficient documentation

## 2024-08-30 DIAGNOSIS — R0789 Other chest pain: Secondary | ICD-10-CM | POA: Diagnosis not present

## 2024-08-30 DIAGNOSIS — E039 Hypothyroidism, unspecified: Secondary | ICD-10-CM | POA: Insufficient documentation

## 2024-08-30 DIAGNOSIS — R079 Chest pain, unspecified: Secondary | ICD-10-CM | POA: Insufficient documentation

## 2024-08-30 DIAGNOSIS — Z8673 Personal history of transient ischemic attack (TIA), and cerebral infarction without residual deficits: Secondary | ICD-10-CM | POA: Diagnosis not present

## 2024-08-30 DIAGNOSIS — I10 Essential (primary) hypertension: Secondary | ICD-10-CM | POA: Insufficient documentation

## 2024-08-30 DIAGNOSIS — Z79899 Other long term (current) drug therapy: Secondary | ICD-10-CM | POA: Diagnosis not present

## 2024-08-30 DIAGNOSIS — Z7901 Long term (current) use of anticoagulants: Secondary | ICD-10-CM | POA: Diagnosis not present

## 2024-08-30 LAB — CBC WITH DIFFERENTIAL/PLATELET
Abs Immature Granulocytes: 0.03 K/uL (ref 0.00–0.07)
Basophils Absolute: 0.1 K/uL (ref 0.0–0.1)
Basophils Relative: 1 %
Eosinophils Absolute: 0.1 K/uL (ref 0.0–0.5)
Eosinophils Relative: 2 %
HCT: 43.4 % (ref 39.0–52.0)
Hemoglobin: 14.1 g/dL (ref 13.0–17.0)
Immature Granulocytes: 0 %
Lymphocytes Relative: 14 %
Lymphs Abs: 1 K/uL (ref 0.7–4.0)
MCH: 30.1 pg (ref 26.0–34.0)
MCHC: 32.5 g/dL (ref 30.0–36.0)
MCV: 92.5 fL (ref 80.0–100.0)
Monocytes Absolute: 0.8 K/uL (ref 0.1–1.0)
Monocytes Relative: 11 %
Neutro Abs: 5.4 K/uL (ref 1.7–7.7)
Neutrophils Relative %: 72 %
Platelets: 187 K/uL (ref 150–400)
RBC: 4.69 MIL/uL (ref 4.22–5.81)
RDW: 14.3 % (ref 11.5–15.5)
WBC: 7.5 K/uL (ref 4.0–10.5)
nRBC: 0 % (ref 0.0–0.2)

## 2024-08-30 LAB — COMPREHENSIVE METABOLIC PANEL WITH GFR
ALT: 11 U/L (ref 0–44)
AST: 16 U/L (ref 15–41)
Albumin: 3.1 g/dL — ABNORMAL LOW (ref 3.5–5.0)
Alkaline Phosphatase: 68 U/L (ref 38–126)
Anion gap: 9 (ref 5–15)
BUN: 24 mg/dL — ABNORMAL HIGH (ref 8–23)
CO2: 22 mmol/L (ref 22–32)
Calcium: 8.9 mg/dL (ref 8.9–10.3)
Chloride: 108 mmol/L (ref 98–111)
Creatinine, Ser: 1.64 mg/dL — ABNORMAL HIGH (ref 0.61–1.24)
GFR, Estimated: 41 mL/min — ABNORMAL LOW (ref >60)
Glucose, Bld: 117 mg/dL — ABNORMAL HIGH (ref 70–99)
Potassium: 4.3 mmol/L (ref 3.5–5.1)
Sodium: 139 mmol/L (ref 135–145)
Total Bilirubin: 0.5 mg/dL (ref 0.0–1.2)
Total Protein: 6.4 g/dL — ABNORMAL LOW (ref 6.5–8.1)

## 2024-08-30 LAB — I-STAT CHEM 8, ED
BUN: 24 mg/dL — ABNORMAL HIGH (ref 8–23)
Calcium, Ion: 1.16 mmol/L (ref 1.15–1.40)
Chloride: 108 mmol/L (ref 98–111)
Creatinine, Ser: 1.8 mg/dL — ABNORMAL HIGH (ref 0.61–1.24)
Glucose, Bld: 118 mg/dL — ABNORMAL HIGH (ref 70–99)
HCT: 43 % (ref 39.0–52.0)
Hemoglobin: 14.6 g/dL (ref 13.0–17.0)
Potassium: 4.3 mmol/L (ref 3.5–5.1)
Sodium: 141 mmol/L (ref 135–145)
TCO2: 20 mmol/L — ABNORMAL LOW (ref 22–32)

## 2024-08-30 LAB — TROPONIN I (HIGH SENSITIVITY)
Troponin I (High Sensitivity): 9 ng/L (ref <18)
Troponin I (High Sensitivity): 8 ng/L (ref <18)

## 2024-08-30 LAB — CBG MONITORING, ED: Glucose-Capillary: 122 mg/dL — ABNORMAL HIGH (ref 70–99)

## 2024-08-30 MED ORDER — ASPIRIN 81 MG PO CHEW
324.0000 mg | CHEWABLE_TABLET | Freq: Once | ORAL | Status: DC
  Filled 2024-08-30: qty 4

## 2024-08-30 NOTE — Telephone Encounter (Signed)
 FYI  Please see below

## 2024-08-30 NOTE — ED Triage Notes (Signed)
 The pt has a past history  of a stroke and he has also had a kidney transplant 2013

## 2024-08-30 NOTE — ED Provider Notes (Signed)
 [place] EMERGENCY DEPARTMENT AT [facility] Provider Note   CSN: [id number] Arrival date & time: 08/30/24  1605     Patient presents with: Chest Pain   Curtis Jimenez is a 88 y.o. male.   This is an 88 year old male presenting emergency department for chest pain that started last night and resolved a few hours prior to my evaluation.  Described as left-sided with some radiation to his arm.  Sharp.  Is worse with inspiration.  No change with exertion.  Notes that he has had similar type pain for years typically once or twice a month the last several hours and resolved spontaneously.  Lasted longer than usual which was concerning to him.  He reports that he has been seen by a cardiologist in the past, but has been sometime.  No fevers chills.  No dyspnea on exertion, orthopnea, lower extremity edema.  Low risk for PE based on Wells criteria   Chest Pain      Prior to Admission medications   Medication Sig Start Date End Date Taking? Authorizing Provider  acetaminophen  (TYLENOL ) 325 MG tablet Take 650 mg by mouth every 6 (six) hours as needed for mild pain.    [provider]  allopurinol  (ZYLOPRIM ) 300 MG tablet Take 1 tablet (300 mg total) by mouth daily. 04/06/24   McGowen, Aleene DEL, MD  amoxicillin  (AMOXIL ) 500 MG capsule Take 500 mg by mouth as needed (for dental). Patient not taking: Reported on 04/12/2024    [provider]  carvedilol  (COREG ) 12.5 MG tablet TAKE 1/2 (ONE-HALF) TABLET BY MOUTH IN THE MORNING AND 1/2 TABLET IN THE EVENING 09/24/23   McGowen, Aleene DEL, MD  carvedilol  (COREG ) 6.25 MG tablet Take 1 tablet (6.25 mg total) by mouth 2 (two) times daily with a meal. 04/06/24   McGowen, Aleene DEL, MD  clopidogrel  (PLAVIX ) 75 MG tablet Take 1 tablet (75 mg total) by mouth daily. 04/06/24   McGowen, Aleene DEL, MD  hydrOXYzine  (ATARAX /VISTARIL ) 25 MG tablet Take 50 mg by mouth at bedtime.    [provider]  levothyroxine  (SYNTHROID ) 25 MCG  tablet Take 1 tablet (25 mcg total) by mouth daily. 04/06/24   McGowen, Aleene DEL, MD  levothyroxine  (SYNTHROID ) 25 MCG tablet Take 1 tablet (25 mcg total) by mouth daily. MUST KEEP APPT FOR FURTHER REFILLS 04/04/24   McGowen, Aleene DEL, MD  mirtazapine  (REMERON ) 15 MG tablet Take 1 tablet (15 mg total) by mouth at bedtime. 04/12/24   Cottle, Lorene KANDICE Raddle., MD  Multiple Vitamin (MULTIVITAMIN WITH MINERALS) TABS tablet Take 1 tablet by mouth at bedtime.    [provider]  mycophenolate  (MYFORTIC ) 180 MG EC tablet Take 180 mg by mouth 2 (two) times daily. Patient taking differently: Take 180 mg by mouth daily. Taking once a day    [provider]  Omega-3 Fatty Acids (FISH OIL) 1000 MG CAPS Take 2,000 mg by mouth in the morning and at bedtime.    [provider]  pantoprazole  (PROTONIX ) 40 MG tablet Take 1 tablet by mouth twice daily 03/13/24   Zehr, Jessica D, PA-C  PREVIDENT 5000 BOOSTER PLUS 1.1 % PSTE 3 (three) times daily. 03/12/24   Davis, Cortney, Student-RN  risperiDONE  (RISPERDAL ) 1 MG tablet Take 2 mg by mouth at bedtime. Half of a 2mg  tab    [provider]  simvastatin  (ZOCOR ) 20 MG tablet Take 1 tablet (20 mg total) by mouth daily. 04/06/24   McGowen, Aleene DEL, MD  sulfamethoxazole -trimethoprim  (BACTRIM ,SEPTRA ) 400-80 MG tablet Take 1 tablet by mouth every Monday, Wednesday, and Friday. In the morning    [provider]  tacrolimus  (PROGRAF ) 0.5 MG capsule Take 0.5-1 mg by mouth See admin instructions. Take 2 capsules (1 mg) by mouth in the morning & take 1 capsule (0.5 mg) by mouth at night.    [provider]    Allergies: Amantadine hcl, Chlorpromazine hcl, and Grapefruit flavor [flavoring agent (non-screening)]    Review of Systems  Cardiovascular:  Positive for chest pain.    Updated Vital Signs BP (!) 148/84   Pulse 70   Temp 98.7 F (37.1 C)   Resp 20   Ht 5' 9 (1.753 m)   Wt 85.3 kg   SpO2 92%   BMI 27.77 kg/m   Physical  Exam Vitals and nursing note reviewed.  Cardiovascular:     Rate and Rhythm: Normal rate and regular rhythm.     Pulses:          Radial pulses are 2+ on the right side and 2+ on the left side.     Heart sounds: Normal heart sounds.  Pulmonary:     Breath sounds: Normal breath sounds.  Abdominal:     Palpations: Abdomen is soft.  Musculoskeletal:        General: Normal range of motion.  Skin:    General: Skin is warm.     Capillary Refill: Capillary refill takes less than 2 seconds.  Neurological:     General: No focal deficit present.     Mental Status: He is alert.  Psychiatric:        Mood and Affect: Mood normal.        Behavior: Behavior normal.     (all labs ordered are listed, but only abnormal results are displayed) Labs Reviewed  COMPREHENSIVE METABOLIC PANEL WITH GFR - Abnormal; Notable for the following components:      Result Value   Glucose, Bld 117 (*)    BUN 24 (*)    Creatinine, Ser 1.64 (*)    Total Protein 6.4 (*)    Albumin 3.1 (*)    GFR, Estimated 41 (*)    All other components within normal limits  CBG MONITORING, ED - Abnormal; Notable for the following components:   Glucose-Capillary 122 (*)    All other components within normal limits  I-STAT CHEM 8, ED - Abnormal; Notable for the following components:   BUN 24 (*)    Creatinine, Ser 1.80 (*)    Glucose, Bld 118 (*)    TCO2 20 (*)    All other components within normal limits  CBC WITH DIFFERENTIAL/PLATELET  TROPONIN I (HIGH SENSITIVITY)  TROPONIN I (HIGH SENSITIVITY)    EKG: EKG Interpretation Date/Time:  Wednesday August 30 2024 16:19:47 EDT Ventricular Rate:  82 PR Interval:  172 QRS Duration:  140 QT Interval:  404 QTC Calculation: 472 R Axis:   -64  Text Interpretation: Normal sinus rhythm Right bundle branch block Left anterior fascicular block Bifascicular block Minimal voltage criteria for LVH, may be normal variant ( R in aVL ) Abnormal ECG When compared with ECG of  07-Aug-2021 13:43, PREVIOUS ECG IS PRESENT Confirmed by Neysa Clap [phone]) on 08/30/2024 9:05:56 PM  Radiology: [facility] Chest 1 View Result Date: 08/30/2024 CLINICAL DATA:  Chest pain EXAM: CHEST  1 VIEW COMPARISON:  Chest x-ray 06/08/2020 FINDINGS: The heart size and mediastinal contours are within normal limits. Both lungs are clear. The visualized  skeletal structures are unremarkable. IMPRESSION: No active disease. Electronically Signed   By: Greig Pique M.D.   On: 08/30/2024 18:43     Procedures   Medications Ordered in the ED - No data to display                                  Medical Decision Making This is a an 88 year old male with complex past medical history to include GERD, hypertension hyperlipidemia hypothyroid status post renal transplant 2013, prior stroke presenting emergency department with chest pain.  Atypical.  Has resolved.  His EKG similar to prior with no ST segment changes indicate ischemia my independent review.  His troponins are negative x 2.  Considered PE, however low risk no fever no tachycardia no hypoxia.  He has no leukocytosis to suggest infectious process.  Chest x-ray reviewed no pneumonia or pneumothorax on my depend interpretation.  Radiology agrees.  Comprehensive panel with baseline renal function.  No other significant abnormalities.  Offered admission for further cardiac workup as patient does have risk factors.  However he declined and would like to follow-up outpatient.  Feel this is reasonable given his reassuring EKG and negative troponins.  Referred to cardiology.  Strict return precautions given.  Will discharge in stable condition at patient's request  Amount and/or Complexity of Data Reviewed Independent Historian:     Details: Spouse notes symptoms have been intermittent roughly once every 6 months External Data Reviewed:     Details: Echo with normal EF in 2017 Labs:     Details: See above Radiology:     Details: See above ECG/medicine  tests: independent interpretation performed.    Details: Similar prior.  No ischemic changes.  Risk Decision regarding hospitalization.      Final diagnoses:  Chest pain, unspecified type    ED Discharge Orders          Ordered    Ambulatory referral to Cardiology       Comments: If you have not heard from the Cardiology office within the next 72 hours please call [phone].   08/30/24 2242               Neysa Caron PARAS, DO 08/30/24 2246

## 2024-08-30 NOTE — Telephone Encounter (Signed)
 FYI Only or Action Required?: FYI only for provider.  Patient was last seen in primary care on 04/06/2024 by McGowen, Aleene DEL, MD.  Called Nurse Triage reporting Chest Pain.  Symptoms began 8 pm last night.  Interventions attempted: Nothing.  Symptoms are: unchanged.  Triage Disposition: Go to ED Now (Notify PCP)  Patient/caregiver understands and will follow disposition?: yes       Copied from CRM [id number]. Topic: Clinical - Red Word Triage >> Aug 30, 2024  3:29 PM Henretta I wrote: Red Word that prompted transfer to Nurse Triage: Mild chest pain Reason for Disposition  Pain also in shoulder(s) or arm(s) or jaw  (Exception: Pain is clearly made worse by movement.)  Answer Assessment - Initial Assessment Questions 1. LOCATION: Where does it hurt?       Left upper chest near axilla 2. RADIATION: Does the pain go anywhere else? (e.g., into neck, jaw, arms, back)     Left arm  3. ONSET: When did the chest pain begin? (Minutes, hours or days)      8 last night  4. PATTERN: Does the pain come and go, or has it been constant since it started?  Does it get worse with exertion?      Related hurts when breath in more  5. DURATION: How long does it last (e.g., seconds, minutes, hours)     constant 6. SEVERITY: How bad is the pain?  (e.g., Scale 1-10; mild, moderate, or severe)     3 now 7 last night  10. OTHER SYMPTOMS: Do you have any other symptoms? (e.g., dizziness, nausea, vomiting, sweating, fever, difficulty breathing, cough)       Hurts with breathing  Protocols used: Chest Pain-A-AH

## 2024-08-30 NOTE — Discharge Instructions (Signed)
 Please follow-up with cardiology.  They should call to schedule appointment.  If you do not hear from them, please call them.  Return for fevers, chills, return of your chest pain, shortness of breath, lightheadedness, passout or any new or worsening symptoms that are concerning to you.

## 2024-08-30 NOTE — Telephone Encounter (Signed)
 noted

## 2024-08-30 NOTE — ED Triage Notes (Signed)
 C/O left sided chest pain radiating to left arm 2000 on 9/2.  Denies SHOB. Hurts on breathing with exertion. Denies n/v.

## 2024-08-30 NOTE — ED Provider Triage Note (Signed)
 Emergency Medicine Provider Triage Evaluation Note  BARAA TUBBS , a 88 y.o. male  was evaluated in triage.  Pt complains of left-sided chest discomfort over the last 18 to 24 hours.  States that it was initially 7/10 pain that radiated into the left shoulder, since then has abated and is now 1-2/10, no radiation at present.  States that pain is worsened with deep inspiration, does not change with exertion.  Review of previous medical history does show pertinent history of hypertension, hyperlipidemia, GERD.  Review of Systems  Positive: As above Negative:   Physical Exam  BP (!) 149/94 (BP Location: Right Arm)   Pulse 79   Temp 98.5 F (36.9 C)   Resp 16   Ht 5' 9 (1.753 m)   Wt 85.3 kg   SpO2 94%   BMI 27.77 kg/m  Gen:   Awake, no distress   Resp:  Normal effort  MSK:   Moves extremities without difficulty  Other:  Normal cardiac and pulmonary auscultation.  Medical Decision Making  Medically screening exam initiated at 4:48 PM.  Appropriate orders placed.  Nancyann JAYSON Neat was informed that the remainder of the evaluation will be completed by another provider, this initial triage assessment does not replace that evaluation, and the importance of remaining in the ED until their evaluation is complete.  Begin initial workup for chest pain.   Myriam Dorn JAYSON, [place] 08/30/24 1650

## 2024-08-31 DIAGNOSIS — Z08 Encounter for follow-up examination after completed treatment for malignant neoplasm: Secondary | ICD-10-CM | POA: Diagnosis not present

## 2024-08-31 DIAGNOSIS — D485 Neoplasm of uncertain behavior of skin: Secondary | ICD-10-CM | POA: Diagnosis not present

## 2024-08-31 DIAGNOSIS — D225 Melanocytic nevi of trunk: Secondary | ICD-10-CM | POA: Diagnosis not present

## 2024-08-31 DIAGNOSIS — L814 Other melanin hyperpigmentation: Secondary | ICD-10-CM | POA: Diagnosis not present

## 2024-08-31 DIAGNOSIS — Z85828 Personal history of other malignant neoplasm of skin: Secondary | ICD-10-CM | POA: Diagnosis not present

## 2024-08-31 DIAGNOSIS — L821 Other seborrheic keratosis: Secondary | ICD-10-CM | POA: Diagnosis not present

## 2024-09-25 ENCOUNTER — Encounter: Payer: Self-pay | Admitting: Cardiology

## 2024-09-25 ENCOUNTER — Ambulatory Visit: Attending: Cardiology | Admitting: Cardiology

## 2024-09-25 VITALS — BP 118/78 | HR 75 | Resp 16 | Ht 69.0 in | Wt 185.0 lb

## 2024-09-25 DIAGNOSIS — R072 Precordial pain: Secondary | ICD-10-CM

## 2024-09-25 DIAGNOSIS — Z940 Kidney transplant status: Secondary | ICD-10-CM | POA: Diagnosis not present

## 2024-09-25 DIAGNOSIS — I10 Essential (primary) hypertension: Secondary | ICD-10-CM

## 2024-09-25 DIAGNOSIS — E782 Mixed hyperlipidemia: Secondary | ICD-10-CM | POA: Diagnosis not present

## 2024-09-25 NOTE — Patient Instructions (Signed)
 Medication Instructions:  Your physician recommends that you continue on your current medications as directed. Please refer to the Current Medication list given to you today.  *If you need a refill on your cardiac medications before your next appointment, please call your pharmacy*  Lab Work: NONE  If you have labs (blood work) drawn today and your tests are completely normal, you will receive your results only by: MyChart Message (if you have MyChart) OR A paper copy in the mail If you have any lab test that is abnormal or we need to change your treatment, we will call you to review the results.  Testing/Procedures: Your physician has requested that you have an echocardiogram. Echocardiography is a painless test that uses sound waves to create images of your heart. It provides your doctor with information about the size and shape of your heart and how well your heart's chambers and valves are working. This procedure takes approximately one hour. There are no restrictions for this procedure. Please do NOT wear cologne, perfume, aftershave, or lotions (deodorant is allowed). Please arrive 15 minutes prior to your appointment time.  Please note: We ask at that you not bring children with you during ultrasound (echo/ vascular) testing. Due to room size and safety concerns, children are not allowed in the ultrasound rooms during exams. Our front office staff cannot provide observation of children in our lobby area while testing is being conducted. An adult accompanying a patient to their appointment will only be allowed in the ultrasound room at the discretion of the ultrasound technician under special circumstances. We apologize for any inconvenience.   Follow-Up: At [facility], you and your health needs are our priority.  As part of our continuing mission to provide you with exceptional heart care, our providers are all part of one team.  This team includes your primary Cardiologist  (physician) and Advanced Practice Providers or APPs (Physician Assistants and Nurse Practitioners) who all work together to provide you with the care you need, when you need it.  Your next appointment:   1 year(s)  Provider:   Madonna Large, DO

## 2024-09-25 NOTE — Progress Notes (Signed)
 Cardiology Office Note:    Date:  09/25/2024  NAME:  Curtis Jimenez    MRN: [id number] DOB:  08/13/1936   PCP:  Curtis Aleene DEL, MD  Former Cardiology Providers: none  Primary Cardiologist:  None, Curtis Jimenez (established care 09/25/2024) Electrophysiologist:  None   Referring MD: Curtis Caron PARAS, DO  Reason of Consult: Chest pain  Chief Complaint  Patient presents with   Atypical chest pain   New Patient (Initial Visit)    History of Present Illness:    Curtis Jimenez is a 88 y.o. Caucasian male whose past medical history and cardiovascular risk factors includes: Hypertension with chronic kidney disease stage III, hyperlipidemia, history of renal transplant, hx of stroke (2020 per patient), hx of cigar smoking (1975), skin cancer . He is being seen today for the evaluation of chest pain at the request of Curtis Caron PARAS, DO.  Patient was referred to cardiology for evaluation of chest pain after recent ED visit on 08/30/2024.  High sensitive troponins negative x 2, EKG not meet ACS criteria, records reviewed.  Patient is accompanied by his wife at today's office visit. Was referred by ED physician for evaluation of chest pain. Started 2 weeks ago. Left-sided. Last episode couple days ago. Intensity 1 out of 10. Worse with sneezing. Improves with Tylenol . No active pain. Not brought on by effort related activities, does not resolve with rest.  Her last cardiovascular evaluation was approximately 13 years ago prior to his transplant.  Father had heart attack at age of 71.   Current Medications: Current Meds  Medication Sig   acetaminophen  (TYLENOL ) 325 MG tablet Take 650 mg by mouth every 6 (six) hours as needed for mild pain.   allopurinol  (ZYLOPRIM ) 300 MG tablet Take 1 tablet (300 mg total) by mouth daily.   amoxicillin  (AMOXIL ) 500 MG capsule Take 500 mg by mouth as needed (for dental).   carvedilol  (COREG ) 6.25 MG tablet Take 1 tablet (6.25 mg total) by mouth 2  (two) times daily with a meal.   clopidogrel  (PLAVIX ) 75 MG tablet Take 1 tablet (75 mg total) by mouth daily.   hydrOXYzine  (ATARAX /VISTARIL ) 25 MG tablet Take 50 mg by mouth at bedtime.   levothyroxine  (SYNTHROID ) 25 MCG tablet Take 1 tablet (25 mcg total) by mouth daily.   mirtazapine  (REMERON ) 15 MG tablet Take 1 tablet (15 mg total) by mouth at bedtime.   Multiple Vitamin (MULTIVITAMIN WITH MINERALS) TABS tablet Take 1 tablet by mouth at bedtime.   mycophenolate  (MYFORTIC ) 180 MG EC tablet Take 180 mg by mouth 2 (two) times daily. (Patient taking differently: Take 180 mg by mouth daily. Taking once a day)   Omega-3 Fatty Acids (FISH OIL) 1000 MG CAPS Take 2,000 mg by mouth in the morning and at bedtime.   pantoprazole  (PROTONIX ) 40 MG tablet Take 1 tablet by mouth twice daily   PREVIDENT 5000 BOOSTER PLUS 1.1 % PSTE 3 (three) times daily.   risperiDONE  (RISPERDAL ) 1 MG tablet Take 2 mg by mouth at bedtime. Half of a 2mg  tab   simvastatin  (ZOCOR ) 20 MG tablet Take 1 tablet (20 mg total) by mouth daily.   sulfamethoxazole -trimethoprim  (BACTRIM ,SEPTRA ) 400-80 MG tablet Take 1 tablet by mouth every Monday, Wednesday, and Friday. In the morning   tacrolimus  (PROGRAF ) 0.5 MG capsule Take 0.5-1 mg by mouth See admin instructions. Take 2 capsules (1 mg) by mouth in the morning & take 1 capsule (0.5 mg) by mouth at night.  Allergies:    Amantadine hcl, Chlorpromazine hcl, and Grapefruit flavor [flavoring agent (non-screening)]   Past Medical History: Past Medical History:  Diagnosis Date   Basal cell carcinoma    neck (skin MD 2X per year)   Bifascicular block 2021   ectopy, asymptomatic.  Cards->observe   Bipolar disorder (HCC)    BPH (benign prostatic hyperplasia)    with hx of elevated PSA; followed by Curtis Jimenez, [facility] as of 2024   Chronic renal insufficiency, stage III (moderate)    in pt w/hx of renal transplant.  Post-transplant sCr 1.4-1.6.   Coronary atherosclerosis     LM and 3 V dz noted on CT 08/2019 performed for eval of interstitial lung dz in the setting of mild DOE and mild hypoxia. Cards->no stress tesing indicated->primary prevention emphasized   CVA (cerebral vascular accident) (HCC) 11/2016   Pontine (vertebrobasilar--imaging neg), TPA given.  Pt discharged on plavix .  Carotid dopplers ok, echocardiogram ok.  Residual deficit: vertigo but this improved greatly with therapy.   DOE (dyspnea on exertion) 2020   DOE and ? hypoxia->CXR 08/29/19--->diffuse interstitial opacities-? acute inflammatory process->Curtis Jimenez eval'd him and was underwhelmed by the CXR and exam->CT chest 09/26/19 Very subtle areas of mild ground-glass attenuatio-nonspecific, mild air trapping. Per Curtis Jimenez, no signif ILD, improving with inc ambulation (post-inflamm pulm fibrosis?)   Elevated PSA    12/2022, mild, referred to urology   Esophagitis 06/14/2020   EGD->+distal esophagitis, h pylori neg, mild distal esoph stenosis widened with forceps, benign gastric polyps.   GERD (gastroesophageal reflux disease)    Gout    History of adenomatous polyp of colon 2018   Recall 5 yrs   Hyperlipidemia    Hypertension    Hypothyroidism    Lumbar spondylosis    Ptosis due to aging    Right   Rectal bleeding 09/2017   Admitted for obs due to Hb drop.  GI consulted---obs recommended.  No transfusion required.  Plavix  was d/c'd, ferrous sulfate  recommended.  Outpt GI f/u--plavix  restarted and upper endoscopy was normal and colonoscopy showed 2 polyps and severe diverticular dz. Iron d/c'd 04/24/19.   Renal cyst    per pt he got MRI kidneys at [facility] 12/2020 and the images will be viewed by Dr. Clayborn   Restless legs syndrome    S/p cadaver renal transplant 2013   Secondary to HTN +lithium toxicity over 30 yrs caused kidney destruction [facility] transplant MDs)   Seborrheic dermatitis    eyebrows worst: Hytone  rx'd by Dr. Livingston.   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    left jawline(CX35FU), Left forehead (CX35FU) right shoulder (CX35FU), R forearm   Swallowing dysfunction    thin liquids (swallowing eval 01/2023)    Past Surgical History: Past Surgical History:  Procedure Laterality Date   AV FISTULA PLACEMENT  04/08/2012   Procedure: ARTERIOVENOUS (AV) FISTULA CREATION;  Surgeon: Lonni GORMAN Blade, MD;  Location: [facility] OR;  Service: Vascular;  Laterality: Left;  Creation of left brachial cephalic arteriovenous fistula   BIOPSY  06/14/2020   Procedure: BIOPSY;  Surgeon: San Sandor GAILS, DO;  Location: [facility];  Service: Gastroenterology;;   CATARACT EXTRACTION, BILATERAL  05/2018   CHOLECYSTECTOMY N/A 08/18/2021   Procedure: LAPAROSCOPIC CHOLECYSTECTOMY WITH CHOLANGIOGRAM;  Surgeon: Eletha Boas, MD;  Location: [facility] ORS;  Service: General;  Laterality: N/A;   COLONOSCOPY  2013; 11/24/17   2013; Normal except diverticulosis (recall 10 yrs).  2018 (for GI bleeding)--severe diverticular dz +  2 adenomatous polyps.  Recall 5 yrs.   DG CHEST AP OR PA ([facility] HX)  06/03/2020   Bibasilar atelectasis. No focal consoliadation definitively identified.   dilation for GERD     ESOPHAGOGASTRODUODENOSCOPY  11/24/2017   gastric polyps x 2, otherwise normal.   ESOPHAGOGASTRODUODENOSCOPY N/A 06/14/2020   +distal esophagitis, h pylori neg, mild distal esoph stenosis widened with forceps, benign gastric polypsProcedure: ESOPHAGOGASTRODUODENOSCOPY (EGD);  Surgeon: San Sandor GAILS, DO;  Location: [facility];  Service: Gastroenterology;  Laterality: N/A;  Diagnostic EGD as patietn last took Plavix  11am on 06/12/2020   KIDNEY TRANSPLANT  11/06/2012   [facility] (cadaveric)   LIGATION OF ARTERIOVENOUS  FISTULA Left 10/21/2021   Procedure: LIGATION OF LEFT ARTERIOVENOUS  FISTULA;  Surgeon: Eliza Lonni RAMAN, MD;  Location: [facility] OR;  Service: Vascular;  Laterality: Left;   PROSTATE BIOPSY  2011   no malignancy   TRANSTHORACIC ECHOCARDIOGRAM  11/2016   Normal LV systolic  fxn, EF 55-60%.  Grade I DD.  Mild aortic root dilatation, mild MV regurg.   Virtual colonoscopy     07/2023 NO POLYPS    Social History: Social History   Tobacco Use   Smoking status: Former    Types: Pipe, Cigars    Quit date: 05/11/1974    Years since quitting: 50.4   Smokeless tobacco: Never  Vaping Use   Vaping status: Never Used  Substance Use Topics   Alcohol use: No   Drug use: No    Family History: Family History  Problem Relation Age of Onset   Ulcers Mother        GI bleed    Heart disease Father    Bladder Cancer Father    Colon cancer Neg Hx    Esophageal cancer Neg Hx    Pancreatic cancer Neg Hx    Rectal cancer Neg Hx    Stomach cancer Neg Hx     ROS:   Review of Systems  Cardiovascular:  Positive for chest pain (see HPI). Negative for claudication, irregular heartbeat, leg swelling, near-syncope, orthopnea, palpitations, paroxysmal nocturnal dyspnea and syncope.  Respiratory:  Negative for shortness of breath.   Hematologic/Lymphatic: Negative for bleeding problem.    EKGs/Labs/Other Studies Reviewed:   EKG: EKG Interpretation Date/Time:  Monday September 25 2024 10:21:57 EDT Ventricular Rate:  77 PR Interval:  174 QRS Duration:  148 QT Interval:  418 QTC Calculation: 473 R Axis:   -59  Text Interpretation: Normal sinus rhythm Right bundle branch block Left anterior fascicular block Bifascicular block Minimal voltage criteria for LVH, may be normal variant ( R in aVL ) When compared with ECG of 30-Aug-2024 16:19, T wave inversion now evident in lead III Reconfirmed by Michele Richardson [phone]) on 09/25/2024 6:50:05 PM  Echocardiogram: 11/2016: LVEF 55 to 60%  Stress Testing:  None  Labs:    Latest Ref Rng & Units 08/30/2024    5:01 PM 08/30/2024    4:59 PM 07/04/2024   12:00 AM  CBC  WBC 4.0 - 10.5 K/uL  7.5  5.5      Hemoglobin 13.0 - 17.0 g/dL 85.3  85.8  85.7      Hematocrit 39.0 - 52.0 % 43.0  43.4  44      Platelets 150 - 400 K/uL  187   185         This result is from an external source.       Latest Ref Rng & Units 08/30/2024    5:01 PM  08/30/2024    4:59 PM 07/04/2024   12:00 AM  BMP  Glucose 70 - 99 mg/dL 881  882    BUN 8 - 23 mg/dL 24  24  20       Creatinine 0.61 - 1.24 mg/dL 8.19  8.35  1.8      Sodium 135 - 145 mmol/L 141  139  142      Potassium 3.5 - 5.1 mmol/L 4.3  4.3  4.4      Chloride 98 - 111 mmol/L 108  108  107      CO2 22 - 32 mmol/L  22  26      Calcium 8.9 - 10.3 mg/dL  8.9  9.1         This result is from an external source.      Latest Ref Rng & Units 08/30/2024    5:01 PM 08/30/2024    4:59 PM 07/04/2024   12:00 AM  CMP  Glucose 70 - 99 mg/dL 881  882    BUN 8 - 23 mg/dL 24  24  20       Creatinine 0.61 - 1.24 mg/dL 8.19  8.35  1.8      Sodium 135 - 145 mmol/L 141  139  142      Potassium 3.5 - 5.1 mmol/L 4.3  4.3  4.4      Chloride 98 - 111 mmol/L 108  108  107      CO2 22 - 32 mmol/L  22  26      Calcium 8.9 - 10.3 mg/dL  8.9  9.1      Total Protein 6.5 - 8.1 g/dL  6.4    Total Bilirubin 0.0 - 1.2 mg/dL  0.5    Alkaline Phos 38 - 126 U/L  68    AST 15 - 41 U/L  16    ALT 0 - 44 U/L  11       This result is from an external source.    Lab Results  Component Value Date   CHOL 127 09/24/2023   HDL 41 09/24/2023   LDLCALC 66 09/24/2023   TRIG 117 09/24/2023   CHOLHDL 3.1 09/24/2023   No results for input(s): LIPOA in the last 8760 hours. No components found for: NTPROBNP No results for input(s): PROBNP in the last 8760 hours. Recent Labs    04/06/24 1056  TSH 0.76    Physical Exam:    Today's Vitals   09/25/24 1021  BP: 118/78  Pulse: 75  Resp: 16  SpO2: 94%  Weight: 185 lb (83.9 kg)  Height: 5' 9 (1.753 m)   Body mass index is 27.32 kg/m. Wt Readings from Last 3 Encounters:  09/25/24 185 lb (83.9 kg)  08/30/24 188 lb 0.8 oz (85.3 kg)  07/05/24 188 lb (85.3 kg)    Physical Exam  Constitutional: No distress.  hemodynamically stable  Neck: No JVD present.   Cardiovascular: Normal rate, regular rhythm, S1 normal and S2 normal. Exam reveals no gallop, no S3 and no S4.  No murmur heard. Pulmonary/Chest: Effort normal and breath sounds normal. No stridor. He has no wheezes. He has no rales.  Musculoskeletal:        General: No edema.     Cervical back: Neck supple.  Skin: Skin is warm.     Impression & Recommendation(s):  Impression:   ICD-10-CM   1. Precordial pain  R07.2 EKG 12-Lead    ECHOCARDIOGRAM COMPLETE  2. Essential hypertension  I10 ECHOCARDIOGRAM COMPLETE    3. Mixed hyperlipidemia  E78.2     4. History of renal transplant  Z94.0        Recommendation(s):  Precordial pain Precordial pain predominately noncardiac. EKG demonstrates sinus rhythm with subtle TWI in lead III. During the recent ER visit-troponins were negative x 2 His overall function capacity remains relatively stable. In an asymptomatic male we will hold off on stress testing at this time. To further risk stratify we will obtain an echocardiogram to evaluate LVEF, regional wall motion abnormality, and valvular heart disease.  If abnormal stress test could be considered. Would avoid contrast exposure if possible given his CKD and history of renal transplant Patient and wife are agreeable with the plan of care. Continue antiplatelet therapy. Continue carvedilol  6.25 mg p.o. twice daily. Continue lipid-lowering agents  Essential hypertension Office blood pressures are very well-controlled. Medications as discussed above Reemphasized importance of low-salt diet.  Mixed hyperlipidemia Continue simvastatin  20 mg p.o. nightly. Most recent LDL 66 mg/dL as of September 7975. Patient plans to have fasting lipids in the coming weeks as part of his yearly physical with PCP  Orders Placed:  Orders Placed This Encounter  Procedures   EKG 12-Lead   ECHOCARDIOGRAM COMPLETE    Standing Status:   Future    Expiration Date:   09/25/2025    Where should this  test [facility] performed[facility]    Does the patient weigh less than or greater than 250 lbs?:   Patient weighs less than 250 lbs    Perflutren DEFINITY (image enhancing agent) should be administered unless hypersensitivity or allergy exist:   Administer Perflutren    Reason for exam-Echo:   Other-Full Diagnosis List    Full ICD-10/Reason for Exam:   Precordial chest pain [813930]     Final Medication List:   No orders of the defined types were placed in this encounter.   Medications Discontinued During This Encounter  Medication Reason   carvedilol  (COREG ) 12.5 MG tablet Dose change   levothyroxine  (SYNTHROID ) 25 MCG tablet Duplicate     Current Outpatient Medications:    acetaminophen  (TYLENOL ) 325 MG tablet, Take 650 mg by mouth every 6 (six) hours as needed for mild pain., Disp: , Rfl:    allopurinol  (ZYLOPRIM ) 300 MG tablet, Take 1 tablet (300 mg total) by mouth daily., Disp: 90 tablet, Rfl: 3   amoxicillin  (AMOXIL ) 500 MG capsule, Take 500 mg by mouth as needed (for dental)., Disp: , Rfl:    carvedilol  (COREG ) 6.25 MG tablet, Take 1 tablet (6.25 mg total) by mouth 2 (two) times daily with a meal., Disp: 180 tablet, Rfl: 3   clopidogrel  (PLAVIX ) 75 MG tablet, Take 1 tablet (75 mg total) by mouth daily., Disp: 90 tablet, Rfl: 3   hydrOXYzine  (ATARAX /VISTARIL ) 25 MG tablet, Take 50 mg by mouth at bedtime., Disp: , Rfl:    levothyroxine  (SYNTHROID ) 25 MCG tablet, Take 1 tablet (25 mcg total) by mouth daily., Disp: 90 tablet, Rfl: 3   mirtazapine  (REMERON ) 15 MG tablet, Take 1 tablet (15 mg total) by mouth at bedtime., Disp: 90 tablet, Rfl: 3   Multiple Vitamin (MULTIVITAMIN WITH MINERALS) TABS tablet, Take 1 tablet by mouth at bedtime., Disp: , Rfl:    mycophenolate  (MYFORTIC ) 180 MG EC tablet, Take 180 mg by mouth 2 (two) times daily. (Patient taking differently: Take 180 mg by mouth daily. Taking once a day), Disp: , Rfl:  Omega-3 Fatty Acids (FISH OIL) 1000 MG CAPS, Take  2,000 mg by mouth in the morning and at bedtime., Disp: , Rfl:    pantoprazole  (PROTONIX ) 40 MG tablet, Take 1 tablet by mouth twice daily, Disp: 180 tablet, Rfl: 0   PREVIDENT 5000 BOOSTER PLUS 1.1 % PSTE, 3 (three) times daily., Disp: , Rfl:    risperiDONE  (RISPERDAL ) 1 MG tablet, Take 2 mg by mouth at bedtime. Half of a 2mg  tab, Disp: , Rfl:    simvastatin  (ZOCOR ) 20 MG tablet, Take 1 tablet (20 mg total) by mouth daily., Disp: 90 tablet, Rfl: 3   sulfamethoxazole -trimethoprim  (BACTRIM ,SEPTRA ) 400-80 MG tablet, Take 1 tablet by mouth every Monday, Wednesday, and Friday. In the morning, Disp: , Rfl:    tacrolimus  (PROGRAF ) 0.5 MG capsule, Take 0.5-1 mg by mouth See admin instructions. Take 2 capsules (1 mg) by mouth in the morning & take 1 capsule (0.5 mg) by mouth at night., Disp: , Rfl:   Consent:   N/A  Disposition:   1 year follow-up sooner if needed Patient may be asked to follow-up sooner based on the results of the above-mentioned testing.  His questions and concerns were addressed to his satisfaction. He voices understanding of the recommendations provided during this encounter.    Signed, Madonna Michele HAS, [facility]  A Division of [facility] [address], [place], [place] [address]  09/25/2024 6:53 PM

## 2024-10-02 ENCOUNTER — Other Ambulatory Visit: Payer: Self-pay | Admitting: Gastroenterology

## 2024-10-06 ENCOUNTER — Encounter: Admitting: Family Medicine

## 2024-10-10 ENCOUNTER — Encounter: Payer: Self-pay | Admitting: Family Medicine

## 2024-10-10 ENCOUNTER — Ambulatory Visit: Admitting: Family Medicine

## 2024-10-10 VITALS — BP 134/78 | HR 71 | Temp 97.5°F | Ht 69.75 in | Wt 189.0 lb

## 2024-10-10 DIAGNOSIS — Z0000 Encounter for general adult medical examination without abnormal findings: Secondary | ICD-10-CM | POA: Diagnosis not present

## 2024-10-10 DIAGNOSIS — R5382 Chronic fatigue, unspecified: Secondary | ICD-10-CM

## 2024-10-10 DIAGNOSIS — Z23 Encounter for immunization: Secondary | ICD-10-CM

## 2024-10-10 DIAGNOSIS — Z79899 Other long term (current) drug therapy: Secondary | ICD-10-CM | POA: Diagnosis not present

## 2024-10-10 DIAGNOSIS — E7800 Pure hypercholesterolemia, unspecified: Secondary | ICD-10-CM

## 2024-10-10 DIAGNOSIS — I10 Essential (primary) hypertension: Secondary | ICD-10-CM

## 2024-10-10 DIAGNOSIS — Z5181 Encounter for therapeutic drug level monitoring: Secondary | ICD-10-CM | POA: Diagnosis not present

## 2024-10-10 DIAGNOSIS — E039 Hypothyroidism, unspecified: Secondary | ICD-10-CM | POA: Diagnosis not present

## 2024-10-10 DIAGNOSIS — R2681 Unsteadiness on feet: Secondary | ICD-10-CM | POA: Diagnosis not present

## 2024-10-10 LAB — LIPID PANEL
Cholesterol: 125 mg/dL (ref 0–200)
HDL: 41.4 mg/dL (ref >39.00)
LDL Cholesterol: 57 mg/dL (ref 0–99)
NonHDL: 83.17
Total CHOL/HDL Ratio: 3
Triglycerides: 131 mg/dL (ref 0.0–149.0)
VLDL: 26.2 mg/dL (ref 0.0–40.0)

## 2024-10-10 LAB — TSH: TSH: 0.71 u[IU]/mL (ref 0.35–5.50)

## 2024-10-10 LAB — VITAMIN B12: Vitamin B-12: 478 pg/mL (ref 211–911)

## 2024-10-10 NOTE — Progress Notes (Signed)
 Office Note 10/10/2024  CC:  Chief Complaint  Patient presents with   Annual Exam    Pt is fasting    HPI:  Patient is a 88 y.o. male who is here for annual health maintenance exam and follow-up hypertension, HLD, hypothyroidism, and chronic renal insufficiency stage III. A/P as of last visit: 1 hypertension, stable on 6.25 Coreg  twice a day. Electrolytes and creatinine today.   2.  Hypercholesterolemia, stable on simvastatin  20 mg a day. LDL was 66 about 6 months ago. Plan repeat lipid panel in 6 months. Liver panel was normal at the [facility] 2 months ago.   3. hypothyroidism. TSHs have been normal on 25 mcg of levothyroxine  daily. TSH today.   4. Chronic renal insufficiency stage III, hx renal transplant. Avoid NSAIDs and hydrate well. Serum creatinine was stable at 1.72 when checked at the [facility] 2 months ago.  Electrolytes were normal. No new labs today.   #5 question of MMR immunity.  He says he never had measles mumps or rubella and never got the vaccine as a child. He wants to check whether or not he has acquired any immunity so we will check antibodies today. If he does not have any immunity we will check with his nephrologist about how we may approach getting him an MMR vaccine given the fact that it is a live attenuated vaccine and he is on tacrolimus  and Myfortic .  INTERIM HX: Curtis Jimenez feels well other than gradually worsening fatigue. He walks for exercise but says he used to do more but got too tired. Denies chest pain (he went to the emergency department last month for chest pain and was diagnosed with musculoskeletal chest pain).  No shortness of breath, dyspnea on exertion, palpitations, or lower extremity swelling. He does feel fairly unstable when walking and has fallen twice.  No dizziness.  No focal weakness.   Past Medical History:  Diagnosis Date   Basal cell carcinoma    neck (skin MD 2X per year)   Bifascicular block 2021   ectopy, asymptomatic.   Cards->observe   Bipolar disorder (HCC)    BPH (benign prostatic hyperplasia)    with hx of elevated PSA; followed [facility] as of 2024   Chronic renal insufficiency, stage III (moderate)    in pt w/hx of renal transplant.  Post-transplant sCr 1.4-1.6.   Coronary atherosclerosis    LM and 3 V dz noted on CT 08/2019 performed for eval of interstitial lung dz in the setting of mild DOE and mild hypoxia. Cards->no stress tesing indicated->primary prevention emphasized   CVA (cerebral vascular accident) (HCC) 11/2016   Pontine (vertebrobasilar--imaging neg), TPA given.  Pt discharged on plavix .  Carotid dopplers ok, echocardiogram ok.  Residual deficit: vertigo but this improved greatly with therapy.   DOE (dyspnea on exertion) 2020   DOE and ? hypoxia->CXR 08/29/19--->diffuse interstitial opacities-? acute inflammatory process->Dr. Darlean eval'd him and was underwhelmed by the CXR and exam->CT chest 09/26/19 Very subtle areas of mild ground-glass attenuatio-nonspecific, mild air trapping. Per Dr. Darlean, no signif ILD, improving with inc ambulation (post-inflamm pulm fibrosis?)   Elevated PSA    12/2022, mild, referred to urology   Esophagitis 06/14/2020   EGD->+distal esophagitis, h pylori neg, mild distal esoph stenosis widened with forceps, benign gastric polyps.   GERD (gastroesophageal reflux disease)    Gout    History of adenomatous polyp of colon 2018   Recall 5 yrs   Hyperlipidemia    Hypertension  Hypothyroidism    Lumbar spondylosis    Ptosis due to aging    Right   Rectal bleeding 09/2017   Admitted for obs due to Hb drop.  GI consulted---obs recommended.  No transfusion required.  Plavix  was d/c'd, ferrous sulfate  recommended.  Outpt GI f/u--plavix  restarted and upper endoscopy was normal and colonoscopy showed 2 polyps and severe diverticular dz. Iron d/c'd 04/24/19.   Renal cyst    per pt he got MRI kidneys at the [facility] 12/2020 and the images will be viewed by  Dr. Clayborn   Restless legs syndrome    S/p cadaver renal transplant 2013   Secondary to HTN +lithium toxicity over 30 yrs caused kidney destruction [facility] transplant MDs)   Seborrheic dermatitis    eyebrows worst: Hytone  rx'd by Dr. Livingston.   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   left jawline(CX35FU), Left forehead (CX35FU) right shoulder (CX35FU), R forearm   Swallowing dysfunction    thin liquids (swallowing eval 01/2023)    Past Surgical History:  Procedure Laterality Date   AV FISTULA PLACEMENT  04/08/2012   Procedure: ARTERIOVENOUS (AV) FISTULA CREATION;  Surgeon: Lonni GORMAN Blade, MD;  Location: [facility];  Service: Vascular;  Laterality: Left;  Creation of left brachial cephalic arteriovenous fistula   BIOPSY  06/14/2020   Procedure: BIOPSY;  Surgeon: San Sandor GAILS, DO;  Location: [facility];  Service: Gastroenterology;;   CATARACT EXTRACTION, BILATERAL  05/2018   CHOLECYSTECTOMY N/A 08/18/2021   Procedure: LAPAROSCOPIC CHOLECYSTECTOMY WITH CHOLANGIOGRAM;  Surgeon: Eletha Boas, MD;  Location: [facility] ORS;  Service: General;  Laterality: N/A;   COLONOSCOPY  2013; 11/24/17   2013; Normal except diverticulosis (recall 10 yrs).  2018 (for GI bleeding)--severe diverticular dz + 2 adenomatous polyps.  Recall 5 yrs.   DG CHEST AP OR PA ([facility] HX)  06/03/2020   Bibasilar atelectasis. No focal consoliadation definitively identified.   dilation for GERD     ESOPHAGOGASTRODUODENOSCOPY  11/24/2017   gastric polyps x 2, otherwise normal.   ESOPHAGOGASTRODUODENOSCOPY N/A 06/14/2020   +distal esophagitis, h pylori neg, mild distal esoph stenosis widened with forceps, benign gastric polypsProcedure: ESOPHAGOGASTRODUODENOSCOPY (EGD);  Surgeon: San Sandor GAILS, DO;  Location: [facility];  Service: Gastroenterology;  Laterality: N/A;  Diagnostic EGD as patietn last took Plavix  11am on 06/12/2020   KIDNEY TRANSPLANT  11/06/2012   [facility] (cadaveric)   LIGATION OF  ARTERIOVENOUS  FISTULA Left 10/21/2021   Procedure: LIGATION OF LEFT ARTERIOVENOUS  FISTULA;  Surgeon: Blade Lonni GORMAN, MD;  Location: [facility] OR;  Service: Vascular;  Laterality: Left;   PROSTATE BIOPSY  2011   no malignancy   TRANSTHORACIC ECHOCARDIOGRAM  11/2016   Normal LV systolic fxn, EF 44-39%.  Grade I DD.  Mild aortic root dilatation, mild MV regurg.   Virtual colonoscopy     07/2023 NO POLYPS    Family History  Problem Relation Age of Onset   Ulcers Mother        GI bleed    Heart disease Father        Age 43   Bladder Cancer Father    Colon cancer Neg Hx    Esophageal cancer Neg Hx    Pancreatic cancer Neg Hx    Rectal cancer Neg Hx    Stomach cancer Neg Hx     Social History   Socioeconomic History   Marital status: Married    Spouse name: Not on file   Number of children: 4  Years of education: Not on file   Highest education level: Not on file  Occupational History   Not on file  Tobacco Use   Smoking status: Former    Types: Pipe, Cigars    Quit date: 05/11/1974    Years since quitting: 50.4   Smokeless tobacco: Never  Vaping Use   Vaping status: Never Used  Substance and Sexual Activity   Alcohol use: No   Drug use: No   Sexual activity: Not Currently    Partners: Female  Other Topics Concern   Not on file  Social History Narrative   Married, 4 children, 9 GGC, 2GGC.   Occupation: retired Dance movement psychotherapist.  Originally from [place].   Tobacco: quit 1975; smoked pipes and cigars x 15 yrs prior to this.   Alcohol: none.   Exercise: minimal, but is going to sign up for silver sneakers at the [facility].   Social Drivers of Corporate investment banker Strain: Low Risk  (07/05/2024)   Overall Financial Resource Strain (CARDIA)    Difficulty of Paying Living Expenses: Not hard at all  Food Insecurity: No Food Insecurity (07/05/2024)   Hunger Vital Sign    Worried About Running Out of Food in the Last Year: Never true    Ran Out of Food in the Last Year:  Never true  Transportation Needs: No Transportation Needs (07/05/2024)   PRAPARE - Administrator, Civil Service (Medical): No    Lack of Transportation (Non-Medical): No  Physical Activity: Insufficiently Active (07/05/2024)   Exercise Vital Sign    Days of Exercise per Week: 2 days    Minutes of Exercise per Session: 20 min  Stress: No Stress Concern Present (07/05/2024)   [employer] of Occupational Health - Occupational Stress Questionnaire    Feeling of Stress: Not at all  Social Connections: Socially Integrated (07/05/2024)   Social Connection and Isolation Panel    Frequency of Communication with Friends and Family: Once a week    Frequency of Social Gatherings with Friends and Family: Twice a week    Attends Religious Services: More than 4 times per year    Active Member of [employer] or Organizations: Yes    Attends Engineer, structural: More than 4 times per year    Marital Status: Married  Catering manager Violence: Not At Risk (07/05/2024)   Humiliation, Afraid, Rape, and Kick questionnaire    Fear of Current or Ex-Partner: No    Emotionally Abused: No    Physically Abused: No    Sexually Abused: No    Outpatient Medications Prior to Visit  Medication Sig Dispense Refill   acetaminophen  (TYLENOL ) 325 MG tablet Take 650 mg by mouth every 6 (six) hours as needed for mild pain.     allopurinol  (ZYLOPRIM ) 300 MG tablet Take 1 tablet (300 mg total) by mouth daily. 90 tablet 3   amoxicillin  (AMOXIL ) 500 MG capsule Take 500 mg by mouth as needed (for dental).     carvedilol  (COREG ) 6.25 MG tablet Take 1 tablet (6.25 mg total) by mouth 2 (two) times daily with a meal. 180 tablet 3   clopidogrel  (PLAVIX ) 75 MG tablet Take 1 tablet (75 mg total) by mouth daily. 90 tablet 3   hydrOXYzine  (ATARAX /VISTARIL ) 25 MG tablet Take 50 mg by mouth at bedtime.     levothyroxine  (SYNTHROID ) 25 MCG tablet Take 1 tablet (25 mcg total) by mouth daily. 90 tablet 3   mirtazapine   (REMERON ) 15 MG tablet  Take 1 tablet (15 mg total) by mouth at bedtime. 90 tablet 3   Multiple Vitamin (MULTIVITAMIN WITH MINERALS) TABS tablet Take 1 tablet by mouth at bedtime.     mycophenolate  (MYFORTIC ) 180 MG EC tablet Take 180 mg by mouth 2 (two) times daily. (Patient taking differently: Take 180 mg by mouth daily. Taking once a day)     Omega-3 Fatty Acids (FISH OIL) 1000 MG CAPS Take 2,000 mg by mouth in the morning and at bedtime.     pantoprazole  (PROTONIX ) 40 MG tablet Take 1 tablet by mouth twice daily 180 tablet 0   PREVIDENT 5000 BOOSTER PLUS 1.1 % PSTE 3 (three) times daily.     risperiDONE  (RISPERDAL ) 1 MG tablet Take 2 mg by mouth at bedtime. Half of a 2mg  tab     simvastatin  (ZOCOR ) 20 MG tablet Take 1 tablet (20 mg total) by mouth daily. 90 tablet 3   sulfamethoxazole -trimethoprim  (BACTRIM ,SEPTRA ) 400-80 MG tablet Take 1 tablet by mouth every Monday, Wednesday, and Friday. In the morning     tacrolimus  (PROGRAF ) 0.5 MG capsule Take 0.5-1 mg by mouth See admin instructions. Take 2 capsules (1 mg) by mouth in the morning & take 1 capsule (0.5 mg) by mouth at night.     No facility-administered medications prior to visit.    Allergies  Allergen Reactions   Amantadine Hcl Itching   Chlorpromazine Hcl Other (See Comments)    fell flat on face   Grapefruit Flavor [Flavoring Agent (Non-Screening)] Other (See Comments)    Can't take with medication    Review of Systems  Constitutional:  Positive for fatigue. Negative for appetite change, chills and fever.  HENT:  Negative for congestion, dental problem, ear pain and sore throat.   Eyes:  Negative for discharge, redness and visual disturbance.  Respiratory:  Negative for cough, chest tightness, shortness of breath and wheezing.   Cardiovascular:  Negative for chest pain, palpitations and leg swelling.  Gastrointestinal:  Negative for abdominal pain, blood in stool, diarrhea, nausea and vomiting.  Genitourinary:  Negative  for difficulty urinating, dysuria, flank pain, frequency, hematuria and urgency.  Musculoskeletal:  Negative for arthralgias, back pain, joint swelling, myalgias and neck stiffness.  Skin:  Negative for pallor and rash.  Neurological:  Negative for dizziness, speech difficulty, weakness and headaches.  Hematological:  Negative for adenopathy. Does not bruise/bleed easily.  Psychiatric/Behavioral:  Negative for confusion and sleep disturbance. The patient is not nervous/anxious.     PE;    10/10/2024    9:50 AM 09/25/2024   10:21 AM 08/30/2024   10:00 PM  Vitals with BMI  Height 5' 9.75 5' 9   Weight 189 lbs 185 lbs   BMI 27.3 27.31   Systolic 134 118 860  Diastolic 78 78 83  Pulse 71 75 71   Gen: Alert, well appearing.  Patient is oriented to person, place, time, and situation. AFFECT: pleasant, lucid thought and speech. ENT: Ears: EACs clear, normal epithelium.  TMs with good light reflex and landmarks bilaterally.  Eyes: no injection, icteris, swelling, or exudate.  EOMI, PERRLA. Nose: no drainage or turbinate edema/swelling.  No injection or focal lesion.  Mouth: lips without lesion/swelling.  Oral mucosa pink and moist.  Dentition intact and without obvious caries or gingival swelling.  Oropharynx without erythema, exudate, or swelling.  Neck: supple/nontender.  No LAD, mass, or TM.  Carotid pulses 2+ bilaterally, without bruits. CV: RRR, no m/r/g.   LUNGS: CTA bilat, nonlabored resps, good  aeration in all lung fields. ABD: soft, NT, ND, BS normal.  No hepatospenomegaly or mass.  No bruits. EXT: no clubbing, cyanosis, or edema.  Musculoskeletal: no joint swelling, erythema, warmth, or tenderness.  ROM of all joints intact. Skin - no sores or suspicious lesions or rashes or color changes  Pertinent labs:  Lab Results  Component Value Date   TSH 0.76 04/06/2024   Lab Results  Component Value Date   WBC 7.5 08/30/2024   HGB 14.6 08/30/2024   HCT 43.0 08/30/2024   MCV 92.5  08/30/2024   PLT 187 08/30/2024   Lab Results  Component Value Date   CREATININE 1.80 (H) 08/30/2024   BUN 24 (H) 08/30/2024   NA 141 08/30/2024   K 4.3 08/30/2024   CL 108 08/30/2024   CO2 22 08/30/2024   Lab Results  Component Value Date   ALT 11 08/30/2024   AST 16 08/30/2024   ALKPHOS 68 08/30/2024   BILITOT 0.5 08/30/2024   Lab Results  Component Value Date   CHOL 127 09/24/2023   Lab Results  Component Value Date   HDL 41 09/24/2023   Lab Results  Component Value Date   LDLCALC 66 09/24/2023   Lab Results  Component Value Date   TRIG 117 09/24/2023   Lab Results  Component Value Date   CHOLHDL 3.1 09/24/2023   Lab Results  Component Value Date   PSA 6.69 (H) 01/22/2023   PSA 4.2 (H) 11/05/2017   PSA 3.51 03/28/2008   Lab Results  Component Value Date   HGBA1C 5.5 09/18/2021   HGBA1C 5.5 09/18/2021   HGBA1C 5.5 (A) 09/18/2021   HGBA1C 5.5 09/18/2021   ASSESSMENT AND PLAN:   1 health maintenance exam: Reviewed age and gender appropriate health maintenance issues (prudent diet, regular exercise, health risks of tobacco and excessive alcohol, use of seatbelts, fire alarms in home, use of sunscreen).  Also reviewed age and gender appropriate health screening as well as vaccine recommendations. Vaccines:  Tdap->pharm.  Flu->given today.  O/W all UTD. Labs: Health panel labs today Prostate cancer screening: elev PSA, most recent was 06/03/2023, level was 5.59 which was stable.  Urology discussed potential for discontinuing PSA testing but patient wanted to continue so he has plans to follow-up regularly. Colon ca screening: Adenomatous polyps on colonoscopy 2018.  He had a virtual colonoscopy in August last year and it showed no polyps.  2 hypertension, stable on 6.25 Coreg  twice a day. Electrolytes normal and serum creatinine stable approximately 5 weeks ago.   3.  Hypercholesterolemia, stable on simvastatin  20 mg a day. LDL was 66 about 1 year  ago. Plan repeat lipid panel today Liver panel was normal approximately 5 weeks ago.   4. hypothyroidism. TSHs have been normal on 25 mcg of levothyroxine  daily. TSH today.   4. Chronic renal insufficiency stage III, hx renal transplant. Avoid NSAIDs and hydrate well. Electrolytes were normal and serum creatinine was stable (1.8) approximately 5 weeks ago.  #5 chronic fatigue, nonspecific. Will check iron panel and vitamin B12 level (chronic PPI therapy).  #6 he has gait instability and 2 falls lately. No injury. Refer to PT.  An After Visit Summary was printed and given to the patient.  FOLLOW UP:  Return in about 6 months (around 04/10/2025) for routine chronic illness f/u.  Signed:  Gerlene Hockey, MD           10/10/2024

## 2024-10-11 ENCOUNTER — Ambulatory Visit: Payer: Self-pay | Admitting: Family Medicine

## 2024-10-11 LAB — IRON,TIBC AND FERRITIN PANEL
%SAT: 16 % — ABNORMAL LOW (ref 20–48)
Ferritin: 59 ng/mL (ref 24–380)
Iron: 42 ug/dL — ABNORMAL LOW (ref 50–180)
TIBC: 256 ug/dL (ref 250–425)

## 2024-10-26 ENCOUNTER — Ambulatory Visit: Admitting: Physical Therapy

## 2024-10-26 DIAGNOSIS — R2689 Other abnormalities of gait and mobility: Secondary | ICD-10-CM | POA: Diagnosis not present

## 2024-10-26 NOTE — Therapy (Unsigned)
 OUTPATIENT PHYSICAL THERAPY LOWER EXTREMITY EVALUATION   Patient Name: Curtis Jimenez MRN: [id number] DOB:Nov 23, 1936, 88 y.o., male Today's Date: 10/26/2024  END OF SESSION:   Past Medical History:  Diagnosis Date   Basal cell carcinoma    neck (skin MD 2X per year)   Bifascicular block 2021   ectopy, asymptomatic.  Cards->observe   Bipolar disorder (HCC)    BPH (benign prostatic hyperplasia)    with hx of elevated PSA; [facility] as of 2024   Chronic renal insufficiency, stage III (moderate)    in pt w/hx of renal transplant.  Post-transplant sCr 1.4-1.6.   Coronary atherosclerosis    LM and 3 V dz noted on CT 08/2019 performed for eval of interstitial lung dz in the setting of mild DOE and mild hypoxia. Cards->no stress tesing indicated->primary prevention emphasized   CVA (cerebral vascular accident) (HCC) 11/2016   Pontine (vertebrobasilar--imaging neg), TPA given.  Pt discharged on plavix .  Carotid dopplers ok, echocardiogram ok.  Residual deficit: vertigo but this improved greatly with therapy.   DOE (dyspnea on exertion) 2020   DOE and ? hypoxia->CXR 08/29/19--->diffuse interstitial opacities-? acute inflammatory process->Dr. Darlean eval'd him and was underwhelmed by the CXR and exam->CT chest 09/26/19 Very subtle areas of mild ground-glass attenuatio-nonspecific, mild air trapping. Per Dr. Darlean, no signif ILD, improving with inc ambulation (post-inflamm pulm fibrosis?)   Elevated PSA    12/2022, mild, referred to urology   Esophagitis 06/14/2020   EGD->+distal esophagitis, h pylori neg, mild distal esoph stenosis widened with forceps, benign gastric polyps.   GERD (gastroesophageal reflux disease)    Gout    History of adenomatous polyp of colon 2018   Recall 5 yrs   Hyperlipidemia    Hypertension    Hypothyroidism    Lumbar spondylosis    Ptosis due to aging    Right   Rectal bleeding 09/2017   Admitted for obs due to Hb drop.  GI  consulted---obs recommended.  No transfusion required.  Plavix  was d/c'd, ferrous sulfate  recommended.  Outpt GI f/u--plavix  restarted and upper endoscopy was normal and colonoscopy showed 2 polyps and severe diverticular dz. Iron d/c'd 04/24/19.   Renal cyst    per pt he got MRI kidneys at the [facility] 12/2020 and the images will be viewed by Dr. Clayborn   Restless legs syndrome    S/p cadaver renal transplant 2013   Secondary to HTN +lithium toxicity over 30 yrs caused kidney destruction [facility] transplant MDs)   Seborrheic dermatitis    eyebrows worst: Hytone  rx'd by Dr. Livingston.   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   left jawline(CX35FU), Left forehead (CX35FU) right shoulder (CX35FU), R forearm   Swallowing dysfunction    thin liquids (swallowing eval 01/2023)   Past Surgical History:  Procedure Laterality Date   AV FISTULA PLACEMENT  04/08/2012   Procedure: ARTERIOVENOUS (AV) FISTULA CREATION;  Surgeon: Lonni GORMAN Blade, MD;  Location: [facility] OR;  Service: Vascular;  Laterality: Left;  Creation of left brachial cephalic arteriovenous fistula   BIOPSY  06/14/2020   Procedure: BIOPSY;  Surgeon: San Sandor GAILS, DO;  [facility]: [facility];  Service: Gastroenterology;;   CATARACT EXTRACTION, BILATERAL  05/2018   CHOLECYSTECTOMY N/A 08/18/2021   Procedure: LAPAROSCOPIC CHOLECYSTECTOMY WITH CHOLANGIOGRAM;  Surgeon: Eletha Boas, MD;  Location: [facility] ORS;  Service: General;  Laterality: N/A;   COLONOSCOPY  2013; 11/24/17   2013; Normal except diverticulosis (recall 10 yrs).  2018 (for GI  bleeding)--severe diverticular dz + 2 adenomatous polyps.  Recall 5 yrs.   DG CHEST AP OR PA ([facility] HX)  06/03/2020   Bibasilar atelectasis. No focal consoliadation definitively identified.   dilation for GERD     ESOPHAGOGASTRODUODENOSCOPY  11/24/2017   gastric polyps x 2, otherwise normal.   ESOPHAGOGASTRODUODENOSCOPY N/A 06/14/2020   +distal esophagitis, h pylori neg, mild distal esoph  stenosis widened with forceps, benign gastric polypsProcedure: ESOPHAGOGASTRODUODENOSCOPY (EGD);  Surgeon: San Sandor GAILS, DO;  [facility]: [facility];  Service: Gastroenterology;  Laterality: N/A;  Diagnostic EGD as patietn last took Plavix  11am on 06/12/2020   KIDNEY TRANSPLANT  11/06/2012   [facility] (cadaveric)   LIGATION OF ARTERIOVENOUS  FISTULA Left 10/21/2021   Procedure: LIGATION OF LEFT ARTERIOVENOUS  FISTULA;  Surgeon: Eliza Lonni RAMAN, MD;  Location: [facility] OR;  Service: Vascular;  Laterality: Left;   PROSTATE BIOPSY  2011   no malignancy   TRANSTHORACIC ECHOCARDIOGRAM  11/2016   Normal LV systolic fxn, EF 44-39%.  Grade I DD.  Mild aortic root dilatation, mild MV regurg.   Virtual colonoscopy     07/2023 NO POLYPS   Patient Active Problem List   Diagnosis Date Noted   Chronic cholecystitis without calculus 08/18/2021   Gallbladder sludge 07/22/2021   Abdominal pain, RUQ (right upper quadrant) 07/22/2021   RUQ abdominal pain 06/04/2021   Peptic stricture of esophagus    Emesis 06/13/2020   Weight loss 06/13/2020   Cough 06/13/2020   AKI (acute kidney injury) 06/13/2020   Metabolic acidosis 06/13/2020   Esophageal dysphagia    Postinflammatory pulmonary fibrosis (HCC) 08/30/2019   DOE (dyspnea on exertion) 08/29/2019   Overweight (BMI 25.0-29.9) 07/27/2019   GIB (gastrointestinal bleeding) 10/07/2017   IFG (impaired fasting glucose) 06/08/2017   Stroke (HCC) 11/30/2016   Essential hypertension 01/16/2015   S/p cadaver renal transplant 11/06/2012   Chronic kidney disease (CKD), stage IV (severe) (HCC) 03/18/2012   BENIGN PROSTATIC HYPERTROPHY 04/18/2008   Extrinsic asthma 12/19/2007   PSA, INCREASED 12/09/2007   Hypothyroidism 08/12/2007   Bipolar disorder (HCC) 07/27/2007   Hyperlipidemia 07/07/2007   Gout 07/07/2007   Essential hypertension 07/07/2007   GERD 07/07/2007     PCP: ***  REFERRING PROVIDER: ***  REFERRING DIAG: ***  THERAPY DIAG:  No  diagnosis found.  Rationale for Evaluation and Treatment: Rehabilitation   ONSET DATE: ***    SUBJECTIVE:   SUBJECTIVE STATEMENT:  Had stroke 5/6 years ago- decreased balance since then. Notices decreased balance in last several months. Fell 2 x in last 2 weeks. 1 walking at [place]- going up incline,  and onece at home when standing up.   AD: does not use.  Does have cane and walker but does not use.  Gets dizzy when getting up and down at church.  BP meds make it low-  Home: Lives with wife. Steps: has 1,  2 in step to get in, with no hand rail.  Driving: Yes, he drives wife who has bad vision.  Recent chest pain- found to be non cardiac- was coughing a lot.  Exercise: 1x/wk tries to walk at some of the parks. Usually goes for 20 min.  [facility]- 1[facility]5 [facility].  Denies SOB or dizziness with walking    PERTINENT HISTORY: Stroke, Kidney transplant, HTN,    PAIN:  Are you having pain? No   PRECAUTIONS: Fall  WEIGHT BEARING RESTRICTIONS: No  FALLS:  Has patient fallen in last 6 months?  Yes. Number of falls 2   PLOF: Independent  PATIENT GOALS:  improve balance, decrease falls.   NEXT MD VISIT:   OBJECTIVE:   DIAGNOSTIC FINDINGS:   PATIENT SURVEYS:    COGNITION: Overall cognitive status: Within functional limits for tasks assessed     SENSATION: WFL  EDEMA:  n/a    POSTURE:    No Significant postural limitations  PALPATION:   LOWER EXTREMITY ROM:  Active /Passive ROM Left eval Right eval  Hip flexion    Hip extension    Hip abduction    Hip adduction    Hip internal rotation    Hip external rotation    Knee flexion    Knee extension    Ankle dorsiflexion    Ankle plantarflexion    Ankle inversion    Ankle eversion     (Blank rows = not tested)  LOWER EXTREMITY MMT:  MMT Left eval Right  eval  Hip flexion    Hip extension    Hip abduction    Hip adduction    Hip internal rotation    Hip external  rotation    Knee flexion 4 4  Knee extension 4+ 4+  Ankle dorsiflexion    Ankle plantarflexion    Ankle inversion    Ankle eversion     (Blank rows = not tested)  LOWER EXTREMITY SPECIAL TESTS:   Seated: eye gaze-   and head turns - no dizziness.    FUNCTIONAL TESTS:  5 time sit to stand: 30.22 sec  TUG:  15.13- used hands to rise from chair  BERG:      GAIT: Distance walked: *** Assistive device utilized: {Assistive devices:23999} Level of assistance: {Levels of assistance:24026} Comments: ***   TODAY'S TREATMENT:                                                                                                                              DATE:  Eval:    PATIENT EDUCATION:  Education details: PT POC, Exam findings, HEP Person educated: Patient Education method: Explanation, Demonstration, Tactile cues, Verbal cues, and Handouts Education comprehension: verbalized understanding, returned demonstration, verbal cues required, tactile cues required, and needs further education   HOME EXERCISE PROGRAM: ***  ASSESSMENT:  CLINICAL IMPRESSION: Patient presents with primary complaint of  ***   Pt with decreased ability for full functional activities. Pt will  benefit from skilled PT to improve deficits and pain and to return to PLOF.   OBJECTIVE IMPAIRMENTS: {opptimpairments:25111}.   ACTIVITY LIMITATIONS: {activitylimitations:27494}  PARTICIPATION LIMITATIONS: {participationrestrictions:25113}  PERSONAL FACTORS: {Personal factors:25162} are also affecting patient's functional outcome.   REHAB POTENTIAL: {rehabpotential:25112}  CLINICAL DECISION MAKING: {clinical decision making:25114}  EVALUATION COMPLEXITY: {Evaluation complexity:25115}   GOALS: Goals reviewed with patient? Yes  SHORT TERM GOALS: Target date: ***  ***  Goal status: INITIAL  2.  ***  Goal status: INITIAL  3.  ***  Goal status: INITIAL    LONG  TERM GOALS: Target date:  ***  ***  Goal status: INITIAL  2.  ***  Goal status: INITIAL  3.  ***  Goal status: INITIAL  4.  *** Baseline:  Goal status: INITIAL  5.  ***  Goal status: INITIAL     PLAN:  PT FREQUENCY: 1-2x/week  PT DURATION: 8 weeks  PLANNED INTERVENTIONS: Therapeutic exercises, Therapeutic activity, Neuromuscular re-education, Patient/Family education, Self Care, Joint mobilization, Joint manipulation, Stair training, Orthotic/Fit training, DME instructions, Aquatic Therapy, Dry Needling, Electrical stimulation, Cryotherapy, Moist heat, Taping, Ultrasound, Ionotophoresis 4mg /ml Dexamethasone , Manual therapy,  Vasopneumatic device, Traction, Spinal manipulation, Spinal mobilization,Balance training, Gait training,   PLAN FOR NEXT SESSION:    Tinnie Don, PT 06/08/2023, 9:15 AM

## 2024-10-27 ENCOUNTER — Encounter: Payer: Self-pay | Admitting: Physical Therapy

## 2024-11-02 ENCOUNTER — Ambulatory Visit: Admitting: Physical Therapy

## 2024-11-02 ENCOUNTER — Encounter: Payer: Self-pay | Admitting: Physical Therapy

## 2024-11-02 DIAGNOSIS — R2689 Other abnormalities of gait and mobility: Secondary | ICD-10-CM | POA: Diagnosis not present

## 2024-11-02 NOTE — Therapy (Signed)
 OUTPATIENT PHYSICAL THERAPY LOWER EXTREMITY TREATMENT   Patient Name: Curtis Jimenez MRN: [id number] DOB:November 16, 1936, 88 y.o., male Today's Date: 11/02/2024   END OF SESSION:  PT End of Session - 11/02/24 1016     Visit Number 2    Number of Visits 16    Date for Recertification  12/21/24    Authorization Type HTA    PT Start Time 1017    PT Stop Time 1100    PT Time Calculation (min) 43 min    Equipment Utilized During Treatment Gait belt    Activity Tolerance Patient tolerated treatment well          Past Medical History:  Diagnosis Date   Basal cell carcinoma    neck (skin MD 2X per year)   Bifascicular block 2021   ectopy, asymptomatic.  Cards->observe   Bipolar disorder (HCC)    BPH (benign prostatic hyperplasia)    with hx of elevated PSA; [facility] as of 2024   Chronic renal insufficiency, stage III (moderate)    in pt w/hx of renal transplant.  Post-transplant sCr 1.4-1.6.   Coronary atherosclerosis    LM and 3 V dz noted on CT 08/2019 performed for eval of interstitial lung dz in the setting of mild DOE and mild hypoxia. Cards->no stress tesing indicated->primary prevention emphasized   CVA (cerebral vascular accident) (HCC) 11/2016   Pontine (vertebrobasilar--imaging neg), TPA given.  Pt discharged on plavix .  Carotid dopplers ok, echocardiogram ok.  Residual deficit: vertigo but this improved greatly with therapy.   DOE (dyspnea on exertion) 2020   DOE and ? hypoxia->CXR 08/29/19--->diffuse interstitial opacities-? acute inflammatory process->Dr. Darlean eval'd him and was underwhelmed by the CXR and exam->CT chest 09/26/19 Very subtle areas of mild ground-glass attenuatio-nonspecific, mild air trapping. Per Dr. Darlean, no signif ILD, improving with inc ambulation (post-inflamm pulm fibrosis?)   Elevated PSA    12/2022, mild, referred to urology   Esophagitis 06/14/2020   EGD->+distal esophagitis, h pylori neg, mild distal esoph stenosis  widened with forceps, benign gastric polyps.   GERD (gastroesophageal reflux disease)    Gout    History of adenomatous polyp of colon 2018   Recall 5 yrs   Hyperlipidemia    Hypertension    Hypothyroidism    Lumbar spondylosis    Ptosis due to aging    Right   Rectal bleeding 09/2017   Admitted for obs due to Hb drop.  GI consulted---obs recommended.  No transfusion required.  Plavix  was d/c'd, ferrous sulfate  recommended.  Outpt GI f/u--plavix  restarted and upper endoscopy was normal and colonoscopy showed 2 polyps and severe diverticular dz. Iron d/c'd 04/24/19.   Renal cyst    per pt he got MRI kidneys at the [facility] 12/2020 and the images will be viewed by Dr. Clayborn   Restless legs syndrome    S/p cadaver renal transplant 2013   Secondary to HTN +lithium toxicity over 30 yrs caused kidney destruction [facility] transplant MDs)   Seborrheic dermatitis    eyebrows worst: Hytone  rx'd by Dr. Livingston.   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   left jawline(CX35FU), Left forehead (CX35FU) right shoulder (CX35FU), R forearm   Swallowing dysfunction    thin liquids (swallowing eval 01/2023)   Past Surgical History:  Procedure Laterality Date   AV FISTULA PLACEMENT  04/08/2012   Procedure: ARTERIOVENOUS (AV) FISTULA CREATION;  Surgeon: Lonni GORMAN Blade, MD;  Location: [facility] OR;  Service: Vascular;  Laterality: Left;  Creation of left brachial cephalic arteriovenous fistula   BIOPSY  06/14/2020   Procedure: BIOPSY;  Surgeon: San Sandor GAILS, DO;  Location[facility];  Service: Gastroenterology;;   CATARACT EXTRACTION, BILATERAL  05/2018   CHOLECYSTECTOMY N/A 08/18/2021   Procedure: LAPAROSCOPIC CHOLECYSTECTOMY WITH CHOLANGIOGRAM;  Surgeon: Eletha Boas, MD;  Location: [facility];  Service: General;  Laterality: N/A;   COLONOSCOPY  2013; 11/24/17   2013; Normal except diverticulosis (recall 10 yrs).  2018 (for GI bleeding)--severe diverticular dz + 2 adenomatous polyps.   Recall 5 yrs.   DG CHEST AP OR PA ([facility] HX)  06/03/2020   Bibasilar atelectasis. No focal consoliadation definitively identified.   dilation for GERD     ESOPHAGOGASTRODUODENOSCOPY  11/24/2017   gastric polyps x 2, otherwise normal.   ESOPHAGOGASTRODUODENOSCOPY N/A 06/14/2020   +distal esophagitis, h pylori neg, mild distal esoph stenosis widened with forceps, benign gastric polypsProcedure: ESOPHAGOGASTRODUODENOSCOPY (EGD);  Surgeon: San Sandor GAILS, DO;  Location: [facility];  Service: Gastroenterology;  Laterality: N/A;  Diagnostic EGD as patietn last took Plavix  11am on 06/12/2020   KIDNEY TRANSPLANT  11/06/2012   [facility] (cadaveric)   LIGATION OF ARTERIOVENOUS  FISTULA Left 10/21/2021   Procedure: LIGATION OF LEFT ARTERIOVENOUS  FISTULA;  Surgeon: Eliza Lonni RAMAN, MD;  Location: [facility] OR;  Service: Vascular;  Laterality: Left;   PROSTATE BIOPSY  2011   no malignancy   TRANSTHORACIC ECHOCARDIOGRAM  11/2016   Normal LV systolic fxn, EF 44-39%.  Grade I DD.  Mild aortic root dilatation, mild MV regurg.   Virtual colonoscopy     07/2023 NO POLYPS   Patient Active Problem List   Diagnosis Date Noted   Chronic cholecystitis without calculus 08/18/2021   Gallbladder sludge 07/22/2021   Abdominal pain, RUQ (right upper quadrant) 07/22/2021   RUQ abdominal pain 06/04/2021   Peptic stricture of esophagus    Emesis 06/13/2020   Weight loss 06/13/2020   Cough 06/13/2020   AKI (acute kidney injury) 06/13/2020   Metabolic acidosis 06/13/2020   Esophageal dysphagia    Postinflammatory pulmonary fibrosis (HCC) 08/30/2019   DOE (dyspnea on exertion) 08/29/2019   Overweight (BMI 25.0-29.9) 07/27/2019   GIB (gastrointestinal bleeding) 10/07/2017   IFG (impaired fasting glucose) 06/08/2017   Stroke (HCC) 11/30/2016   Essential hypertension 01/16/2015   S/p cadaver renal transplant 11/06/2012   Chronic kidney disease (CKD), stage IV (severe) (HCC) 03/18/2012   BENIGN PROSTATIC  HYPERTROPHY 04/18/2008   Extrinsic asthma 12/19/2007   PSA, INCREASED 12/09/2007   Hypothyroidism 08/12/2007   Bipolar disorder (HCC) 07/27/2007   Hyperlipidemia 07/07/2007   Gout 07/07/2007   Essential hypertension 07/07/2007   GERD 07/07/2007     PCP: Aleene Hockey  REFERRING PROVIDER: Aleene Hockey  REFERRING DIAG: Unstable gait   THERAPY DIAG:  Other abnormalities of gait and mobility  Rationale for Evaluation and Treatment: Rehabilitation   ONSET DATE:     SUBJECTIVE:   SUBJECTIVE STATEMENT: Pt with no new complaints.    Eval: Pt reports he Had stroke 5/6 years ago-, states decreased balance since then. Notices decreased balance in last several months. Fell 2 x in last 2 weeks. 1 walking at park, going up incline,  and once at home when standing up and turning from chair.  AD: does not use.  Does have cane and walker but does not use.  Gets dizzy when getting up and down at church, but states not at other times. Thinks it is when he sings.  BP - states his meds make it low. Home: Lives with wife. Steps: has 1,  2 in step to get in, with no hand rail.  Driving: Yes, he drives wife who has bad vision.  Recent chest pain- found to be non cardiac- was coughing a lot.  Exercise: 1x/wk tries to walk at some of the parks. Usually goes for 20 min.  [facility]- [facility].  Denies SOB or dizziness with walking    PERTINENT HISTORY: Stroke, Kidney transplant, HTN,    PAIN:  Are you having pain? No   PRECAUTIONS: Fall  WEIGHT BEARING RESTRICTIONS: No  FALLS:  Has patient fallen in last 6 months? Yes. Number of falls 2   PLOF: Independent  PATIENT GOALS:  improve balance, decrease falls.   NEXT MD VISIT:   OBJECTIVE:   DIAGNOSTIC FINDINGS:   PATIENT SURVEYS:    COGNITION: Overall cognitive status: Within functional limits for tasks assessed     SENSATION: WFL  EDEMA:  n/a    POSTURE:    No Significant  postural limitations  PALPATION:   LOWER EXTREMITY ROM: Hips: WFL, Knees: WFL   LOWER EXTREMITY MMT:  MMT Left eval Right  eval  Hip flexion 4 4  Hip extension    Hip abduction    Hip adduction    Hip internal rotation    Hip external rotation    Knee flexion 4 4  Knee extension 4+ 4+  Ankle dorsiflexion    Ankle plantarflexion    Ankle inversion    Ankle eversion     (Blank rows = not tested)  LOWER EXTREMITY SPECIAL TESTS:   Seated: eye gaze-   and head turns - no dizziness elicited States looking back and forth when standing can make him dizzy.    FUNCTIONAL TESTS:  5 time sit to stand: 30.22 sec  TUG:  15.13- used hands to rise from chair  BERG:    GAIT: Distance walked: 150 Assistive device utilized: None Level of assistance: Complete Independence Comments: decreased balance, slow speed, likley would benefit from use of SPC   TODAY'S TREATMENT:                                                                                                                              DATE:   11/02/2024 Therapeutic Exercise: Aerobic: Supine: Seated: ankle pumps x 15, sit to stand- higher mat table x 10;  LAQ 2 x 10 bil;  Standing:  Heel raises x 15 at counter;   Stretches:  Neuromuscular Re-education: Staggered stance weight shifts x 20 bil (x10 with same side reaching) Reaching outside of bos x 10 bil;  Tandem stance(slight modified ) 20 sec x 2 bil at counter.  Standing torso turns x10 bil; Manual Therapy: Therapeutic Activity: Self Care:    PATIENT EDUCATION:  Education details: PT POC, Exam findings, HEP Person educated: Patient Education method: Explanation, Demonstration, Tactile cues,  Verbal cues, and Handouts Education comprehension: verbalized understanding, returned demonstration, verbal cues required, tactile cues required, and needs further education   HOME EXERCISE PROGRAM: Access Code: [email or web address] URL:  [email or web address] Date: 11/02/2024 Prepared by: Tinnie Don  Exercises - Seated Knee Extension AROM  - 1 x daily - 2 sets - 10 reps - Seated Ankle Pumps on Table  - 1 x daily - 1-2 sets - 10 reps - Sit to Stand  - 1 x daily - 1 sets - 10 reps - Heel Raises with Counter Support  - 1 x daily - 1- 2 sets - 10 reps  ASSESSMENT:  CLINICAL IMPRESSION: Pt challenged with balance exercises today. Education on LE strength for initial HEP. Pt to benefit from progressive balance, and moving outside of cog, reaching, stepping, turning .  Eval: Patient presents with primary complaint of  decreased balance. He has decreased sores on berg,and 5 time sit to stand. He will benefit from practice with head turns in standing and with walking, and work on dynamic balance, to improve safety with his outdoor walking.  Pt with decreased ability for full functional activities. Pt will  benefit from skilled PT to improve deficits and pain and to return to PLOF.   OBJECTIVE IMPAIRMENTS: Abnormal gait, decreased activity tolerance, decreased balance, decreased knowledge of use of DME, decreased mobility, decreased strength, impaired vision/preception, improper body mechanics, and pain.   ACTIVITY LIMITATIONS: bending, standing, squatting, stairs, transfers, and locomotion level  PARTICIPATION LIMITATIONS: meal prep, cleaning, shopping, and community activity  PERSONAL FACTORS: Time since onset of injury/illness/exacerbation and 1 comorbidity: stroke,  are also affecting patient's functional outcome.   REHAB POTENTIAL: Good  CLINICAL DECISION MAKING: Evolving/moderate complexity  EVALUATION COMPLEXITY: Moderate   GOALS: Goals reviewed with patient? Yes  SHORT TERM GOALS: Target date: 11/16/2024   Pt to be independent with initial HEP  Goal status: INITIAL  2.  Pt to demo ability to rise from higher mat table, without use of UEs.   Goal status: INITIAL     LONG TERM GOALS:  Target date: 12/21/2024   Pt to be independent with final HEP  Goal status: INITIAL  2.  Pt to improve score on 5 time sit to stand by at least 10 sec.   Goal status: INITIAL  3.  Pt to improve score on BERG by at least 5 points.   Goal status: INITIAL  4.  Pt to demo ambulation on level surfaces to be [facility] , with dynamic movement, head turns and direction changes .  Goal status: INITIAL     PLAN:  PT FREQUENCY: 1-2x/week  PT DURATION: 8 weeks  PLANNED INTERVENTIONS: Therapeutic exercises, Therapeutic activity, Neuromuscular re-education, Patient/Family education, Self Care, Joint mobilization, Joint manipulation, Stair training, Orthotic/Fit training, DME instructions, Aquatic Therapy, Dry Needling, Electrical stimulation, Cryotherapy, Moist heat, Taping, Ultrasound, Ionotophoresis 4mg /ml Dexamethasone , Manual therapy,  Vasopneumatic device, Traction, Spinal manipulation, Spinal mobilization,Balance training, Gait training,   PLAN FOR NEXT SESSION: more challenged on R, static balance, reaching outside bos,   Tinnie Don, PT, DPT 10:16 AM  11/02/24

## 2024-11-03 ENCOUNTER — Telehealth: Payer: Self-pay | Admitting: Pharmacist

## 2024-11-03 NOTE — Progress Notes (Signed)
 Pharmacy Quality Measure Review  This patient is appearing on a report for being at risk of failing the adherence measure for cholesterol (statin) medications this calendar year.   Medication: simvastatin  20mg  Last fill date: 07/31/2024 for 90 day supply  Reviewed recent refill history in Dr Annemarie database. Patient is past due to refill simvastatin .  Patient has 3 refills remaining. Next appointment with PCP is 04/10/2024.      Called patient Contacted pharmacy to facilitate refills. [facility] had 11/07/2024 as date for his refill for simvastatin  but his would be past due date of 10/29/2024. Patient endorsed would like to get simvastatin  filled today instead of 11/07/2024 so he could pick up with other medications - allopurinol  and levothyroxine  that are being filled today. [facility] was able to fill simvastatin  today.   Madelin Ray, PharmD Clinical Pharmacist [facility]  Population Health [phone]

## 2024-11-06 DIAGNOSIS — Z940 Kidney transplant status: Secondary | ICD-10-CM | POA: Diagnosis not present

## 2024-11-06 DIAGNOSIS — I129 Hypertensive chronic kidney disease with stage 1 through stage 4 chronic kidney disease, or unspecified chronic kidney disease: Secondary | ICD-10-CM | POA: Diagnosis not present

## 2024-11-06 DIAGNOSIS — M109 Gout, unspecified: Secondary | ICD-10-CM | POA: Diagnosis not present

## 2024-11-07 ENCOUNTER — Ambulatory Visit (HOSPITAL_COMMUNITY)
Admission: RE | Admit: 2024-11-07 | Discharge: 2024-11-07 | Disposition: A | Discharge status: Home / Self Care | Source: Ambulatory Visit | Attending: Cardiovascular Disease | Admitting: Cardiovascular Disease

## 2024-11-07 DIAGNOSIS — R072 Precordial pain: Secondary | ICD-10-CM | POA: Diagnosis not present

## 2024-11-07 DIAGNOSIS — I10 Essential (primary) hypertension: Secondary | ICD-10-CM | POA: Insufficient documentation

## 2024-11-07 LAB — ECHOCARDIOGRAM COMPLETE
Area-P 1/2: 2.94 cm2
MV M vel: 5.22 m/s
MV Peak grad: 109 mmHg
S' Lateral: 2.6 cm

## 2024-11-09 ENCOUNTER — Encounter: Payer: Self-pay | Admitting: Physical Therapy

## 2024-11-09 ENCOUNTER — Ambulatory Visit: Admitting: Physical Therapy

## 2024-11-09 DIAGNOSIS — R2689 Other abnormalities of gait and mobility: Secondary | ICD-10-CM

## 2024-11-09 NOTE — Therapy (Signed)
 OUTPATIENT PHYSICAL THERAPY LOWER EXTREMITY TREATMENT   Patient Name: Curtis Jimenez MRN: [id number] DOB:1936/07/16, 88 y.o., male Today's Date: 11/09/2024   END OF SESSION:  PT End of Session - 11/09/24 1426     Visit Number 3    Number of Visits 16    Date for Recertification  12/21/24    Authorization Type HTA    PT Start Time 1352    PT Stop Time 1430    PT Time Calculation (min) 38 min    Equipment Utilized During Treatment Gait belt    Activity Tolerance Patient tolerated treatment well           Past Medical History:  Diagnosis Date   Basal cell carcinoma    neck (skin MD 2X per year)   Bifascicular block 2021   ectopy, asymptomatic.  Cards->observe   Bipolar disorder (HCC)    BPH (benign prostatic hyperplasia)    with hx of elevated PSA; followed by [facility] as of 2024   Chronic renal insufficiency, stage III (moderate)    in pt w/hx of renal transplant.  Post-transplant sCr 1.4-1.6.   Coronary atherosclerosis    LM and 3 V dz noted on CT 08/2019 performed for eval of interstitial lung dz in the setting of mild DOE and mild hypoxia. Cards->no stress tesing indicated->primary prevention emphasized   CVA (cerebral vascular accident) (HCC) 11/2016   Pontine (vertebrobasilar--imaging neg), TPA given.  Pt discharged on plavix .  Carotid dopplers ok, echocardiogram ok.  Residual deficit: vertigo but this improved greatly with therapy.   DOE (dyspnea on exertion) 2020   DOE and ? hypoxia->CXR 08/29/19--->diffuse interstitial opacities-? acute inflammatory process->Dr. Darlean eval'd him and was underwhelmed by the CXR and exam->CT chest 09/26/19 Very subtle areas of mild ground-glass attenuatio-nonspecific, mild air trapping. Per Dr. Darlean, no signif ILD, improving with inc ambulation (post-inflamm pulm fibrosis?)   Elevated PSA    12/2022, mild, referred to urology   Esophagitis 06/14/2020   EGD->+distal esophagitis, h pylori neg, mild distal esoph stenosis  widened with forceps, benign gastric polyps.   GERD (gastroesophageal reflux disease)    Gout    History of adenomatous polyp of colon 2018   Recall 5 yrs   Hyperlipidemia    Hypertension    Hypothyroidism    Lumbar spondylosis    Ptosis due to aging    Right   Rectal bleeding 09/2017   Admitted for obs due to Hb drop.  GI consulted---obs recommended.  No transfusion required.  Plavix  was d/c'd, ferrous sulfate  recommended.  Outpt GI f/u--plavix  restarted and upper endoscopy was normal and colonoscopy showed 2 polyps and severe diverticular dz. Iron d/c'd 04/24/19.   Renal cyst    per pt he got MRI kidneys at the [facility] 12/2020 and the images will be viewed by Dr. Clayborn   Restless legs syndrome    S/p cadaver renal transplant 2013   Secondary to HTN +lithium toxicity over 30 yrs caused kidney destruction [facility] transplant MDs)   Seborrheic dermatitis    eyebrows worst: Hytone  rx'd by Dr. Livingston.   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   left jawline(CX35FU), Left forehead (CX35FU) right shoulder (CX35FU), R forearm   Swallowing dysfunction    thin liquids (swallowing eval 01/2023)   Past Surgical History:  Procedure Laterality Date   AV FISTULA PLACEMENT  04/08/2012   Procedure: ARTERIOVENOUS (AV) FISTULA CREATION;  Surgeon: Lonni GORMAN Blade, MD;  Location: [facility] OR;  Service: Vascular;  Laterality: Left;  Creation of left brachial cephalic arteriovenous fistula   BIOPSY  06/14/2020   Procedure: BIOPSY;  Surgeon: San Sandor GAILS, DO;  Location: [facility];  Service: Gastroenterology;;   CATARACT EXTRACTION, BILATERAL  05/2018   CHOLECYSTECTOMY N/A 08/18/2021   Procedure: LAPAROSCOPIC CHOLECYSTECTOMY WITH CHOLANGIOGRAM;  Surgeon: Eletha Boas, MD;  Location: [facility] ORS;  Service: General;  Laterality: N/A;   COLONOSCOPY  2013; 11/24/17   2013; Normal except diverticulosis (recall 10 yrs).  2018 (for GI bleeding)--severe diverticular dz + 2 adenomatous polyps.   Recall 5 yrs.   DG CHEST AP OR PA ([facility] HX)  06/03/2020   Bibasilar atelectasis. No focal consoliadation definitively identified.   dilation for GERD     ESOPHAGOGASTRODUODENOSCOPY  11/24/2017   gastric polyps x 2, otherwise normal.   ESOPHAGOGASTRODUODENOSCOPY N/A 06/14/2020   +distal esophagitis, h pylori neg, mild distal esoph stenosis widened with forceps, benign gastric polypsProcedure: ESOPHAGOGASTRODUODENOSCOPY (EGD);  Surgeon: San Sandor GAILS, DO;  Location: [facility];  Service: Gastroenterology;  Laterality: N/A;  Diagnostic EGD as patietn last took Plavix  11am on 06/12/2020   KIDNEY TRANSPLANT  11/06/2012   [facility] (cadaveric)   LIGATION OF ARTERIOVENOUS  FISTULA Left 10/21/2021   Procedure: LIGATION OF LEFT ARTERIOVENOUS  FISTULA;  Surgeon: Eliza Lonni RAMAN, MD;  Location: [facility] OR;  Service: Vascular;  Laterality: Left;   PROSTATE BIOPSY  2011   no malignancy   TRANSTHORACIC ECHOCARDIOGRAM  11/2016   Normal LV systolic fxn, EF 44-39%.  Grade I DD.  Mild aortic root dilatation, mild MV regurg.   Virtual colonoscopy     07/2023 NO POLYPS   Patient Active Problem List   Diagnosis Date Noted   Chronic cholecystitis without calculus 08/18/2021   Gallbladder sludge 07/22/2021   Abdominal pain, RUQ (right upper quadrant) 07/22/2021   RUQ abdominal pain 06/04/2021   Peptic stricture of esophagus    Emesis 06/13/2020   Weight loss 06/13/2020   Cough 06/13/2020   AKI (acute kidney injury) 06/13/2020   Metabolic acidosis 06/13/2020   Esophageal dysphagia    Postinflammatory pulmonary fibrosis (HCC) 08/30/2019   DOE (dyspnea on exertion) 08/29/2019   Overweight (BMI 25.0-29.9) 07/27/2019   GIB (gastrointestinal bleeding) 10/07/2017   IFG (impaired fasting glucose) 06/08/2017   Stroke (HCC) 11/30/2016   Essential hypertension 01/16/2015   S/p cadaver renal transplant 11/06/2012   Chronic kidney disease (CKD), stage IV (severe) (HCC) 03/18/2012   BENIGN PROSTATIC  HYPERTROPHY 04/18/2008   Extrinsic asthma 12/19/2007   PSA, INCREASED 12/09/2007   Hypothyroidism 08/12/2007   Bipolar disorder (HCC) 07/27/2007   Hyperlipidemia 07/07/2007   Gout 07/07/2007   Essential hypertension 07/07/2007   GERD 07/07/2007     PCP: Aleene Hockey  REFERRING PROVIDER: Aleene Hockey  REFERRING DIAG: Unstable gait   THERAPY DIAG:  Other abnormalities of gait and mobility  Rationale for Evaluation and Treatment: Rehabilitation   ONSET DATE:     SUBJECTIVE:   SUBJECTIVE STATEMENT: Pt with no new complaints. Feels balance is improving. Still trying to walk 1x/wk at a park without AD.    Eval: Pt reports he Had stroke 5/6 years ago-, states decreased balance since then. Notices decreased balance in last several months. Fell 2 x in last 2 weeks. 1 walking at park, going up incline,  and once at home when standing up and turning from chair.  AD: does not use.  Does have cane and walker but does not use.  Gets dizzy when getting up and down at  church, but states not at other times. Thinks it is when he sings.  BP - states his meds make it low. Home: Lives with wife. Steps: has 1,  2 in step to get in, with no hand rail.  Driving: Yes, he drives wife who has bad vision.  Recent chest pain- found to be non cardiac- was coughing a lot.  Exercise: 1x/wk tries to walk at some of the parks. Usually goes for 20 min.  [facility] 1[facility]5 [facility].  Denies SOB or dizziness with walking    PERTINENT HISTORY: Stroke, Kidney transplant, HTN,    PAIN:  Are you having pain? No   PRECAUTIONS: Fall  WEIGHT BEARING RESTRICTIONS: No  FALLS:  Has patient fallen in last 6 months? Yes. Number of falls 2   PLOF: Independent  PATIENT GOALS:  improve balance, decrease falls.   NEXT MD VISIT:   OBJECTIVE:   DIAGNOSTIC FINDINGS:   PATIENT SURVEYS:    COGNITION: Overall cognitive status: Within functional limits for tasks  assessed     SENSATION: WFL  EDEMA:  n/a    POSTURE:    No Significant postural limitations  PALPATION:   LOWER EXTREMITY ROM: Hips: WFL, Knees: WFL   LOWER EXTREMITY MMT:  MMT Left eval Right  eval  Hip flexion 4 4  Hip extension    Hip abduction    Hip adduction    Hip internal rotation    Hip external rotation    Knee flexion 4 4  Knee extension 4+ 4+  Ankle dorsiflexion    Ankle plantarflexion    Ankle inversion    Ankle eversion     (Blank rows = not tested)  LOWER EXTREMITY SPECIAL TESTS:   Seated: eye gaze-   and head turns - no dizziness elicited States looking back and forth when standing can make him dizzy.    FUNCTIONAL TESTS:  5 time sit to stand: 30.22 sec  TUG:  15.13- used hands to rise from chair  BERG:    GAIT: Distance walked: 150 Assistive device utilized: None Level of assistance: Complete Independence Comments: decreased balance, slow speed, likley would benefit from use of SPC   TODAY'S TREATMENT:                                                                                                                              DATE:   11/09/2024 Therapeutic Exercise: Aerobic: Supine: Seated: ankle pumps x 15,  sit to stand- higher mat table x 10;    Standing:  Heel raises x 15 at counter;   Stretches:  Neuromuscular Re-education: Staggered stance weight shifts x 20 bil (x10 with same side reaching) Reaching outside of bos x 10 bil;  Tandem stance(slight modified ) 20 sec x 2 bil at counter.  Standing torso turns x10 bil; Bending to pick object up from floor x 3 bil- cueing for foot and head placement.  Manual Therapy:  Therapeutic Activity: Self Care:     Therapeutic Exercise: Aerobic: Supine: Seated: ankle pumps x 15, sit to stand- higher mat table x 10;  LAQ 2 x 10 bil;  Standing:  Heel raises x 15 at counter;   Stretches:  Neuromuscular Re-education: Staggered stance weight shifts x 20 bil (x10 with same side  reaching) Reaching outside of bos x 10 bil;  Tandem stance(slight modified ) 20 sec x 2 bil at counter.  Standing torso turns x10 bil; Manual Therapy: Therapeutic Activity: Self Care:    PATIENT EDUCATION:  Education details: PT POC, Exam findings, HEP Person educated: Patient Education method: Explanation, Demonstration, Tactile cues, Verbal cues, and Handouts Education comprehension: verbalized understanding, returned demonstration, verbal cues required, tactile cues required, and needs further education   HOME EXERCISE PROGRAM: Access Code: [email or web address] URL: [email or web address] Date: 11/02/2024 Prepared by: Tinnie Don  Exercises - Seated Knee Extension AROM  - 1 x daily - 2 sets - 10 reps - Seated Ankle Pumps on Table  - 1 x daily - 1-2 sets - 10 reps - Sit to Stand  - 1 x daily - 1 sets - 10 reps - Heel Raises with Counter Support  - 1 x daily - 1- 2 sets - 10 reps  ASSESSMENT:  CLINICAL IMPRESSION: Pt challenged with balance exercises today. Pt to benefit from progressive balance, and moving outside of cog, reaching, stepping, turning , and more dynamic balance as able.   Eval: Patient presents with primary complaint of  decreased balance. He has decreased sores on berg,and 5 time sit to stand. He will benefit from practice with head turns in standing and with walking, and work on dynamic balance, to improve safety with his outdoor walking.  Pt with decreased ability for full functional activities. Pt will  benefit from skilled PT to improve deficits and pain and to return to PLOF.   OBJECTIVE IMPAIRMENTS: Abnormal gait, decreased activity tolerance, decreased balance, decreased knowledge of use of DME, decreased mobility, decreased strength, impaired vision/preception, improper body mechanics, and pain.   ACTIVITY LIMITATIONS: bending, standing, squatting, stairs, transfers, and locomotion level  PARTICIPATION LIMITATIONS: meal prep, cleaning, shopping,  and community activity  PERSONAL FACTORS: Time since onset of injury/illness/exacerbation and 1 comorbidity: stroke,  are also affecting patient's functional outcome.   REHAB POTENTIAL: Good  CLINICAL DECISION MAKING: Evolving/moderate complexity  EVALUATION COMPLEXITY: Moderate   GOALS: Goals reviewed with patient? Yes  SHORT TERM GOALS: Target date: 11/16/2024   Pt to be independent with initial HEP  Goal status: INITIAL  2.  Pt to demo ability to rise from higher mat table, without use of UEs.   Goal status: INITIAL     LONG TERM GOALS: Target date: 12/21/2024   Pt to be independent with final HEP  Goal status: INITIAL  2.  Pt to improve score on 5 time sit to stand by at least 10 sec.   Goal status: INITIAL  3.  Pt to improve score on BERG by at least 5 points.   Goal status: INITIAL  4.  Pt to demo ambulation on level surfaces to be [facility] , with dynamic movement, head turns and direction changes .  Goal status: INITIAL     PLAN:  PT FREQUENCY: 1-2x/week  PT DURATION: 8 weeks  PLANNED INTERVENTIONS: Therapeutic exercises, Therapeutic activity, Neuromuscular re-education, Patient/Family education, Self Care, Joint mobilization, Joint manipulation, Stair training, Orthotic/Fit training, DME instructions, Aquatic Therapy, Dry Needling, Electrical stimulation, Cryotherapy, Moist heat, Taping, Ultrasound,  Ionotophoresis 4mg /ml Dexamethasone , Manual therapy,  Vasopneumatic device, Traction, Spinal manipulation, Spinal mobilization,Balance training, Gait training,   PLAN FOR NEXT SESSION: more challenged on R, static balance, reaching outside bos,  toe taps, step ups .  Tinnie Don, PT, DPT 2:28 PM  11/09/24

## 2024-11-13 DIAGNOSIS — R339 Retention of urine, unspecified: Secondary | ICD-10-CM | POA: Diagnosis not present

## 2024-11-13 DIAGNOSIS — D849 Immunodeficiency, unspecified: Secondary | ICD-10-CM | POA: Diagnosis not present

## 2024-11-13 DIAGNOSIS — R42 Dizziness and giddiness: Secondary | ICD-10-CM | POA: Diagnosis not present

## 2024-11-13 DIAGNOSIS — Z299 Encounter for prophylactic measures, unspecified: Secondary | ICD-10-CM | POA: Diagnosis not present

## 2024-11-13 DIAGNOSIS — Z940 Kidney transplant status: Secondary | ICD-10-CM | POA: Diagnosis not present

## 2024-11-13 DIAGNOSIS — Z5181 Encounter for therapeutic drug level monitoring: Secondary | ICD-10-CM | POA: Diagnosis not present

## 2024-11-13 DIAGNOSIS — Z131 Encounter for screening for diabetes mellitus: Secondary | ICD-10-CM | POA: Diagnosis not present

## 2024-11-13 DIAGNOSIS — Z79621 Long term (current) use of calcineurin inhibitor: Secondary | ICD-10-CM | POA: Diagnosis not present

## 2024-11-13 DIAGNOSIS — Z2989 Encounter for other specified prophylactic measures: Secondary | ICD-10-CM | POA: Diagnosis not present

## 2024-11-13 DIAGNOSIS — Z8719 Personal history of other diseases of the digestive system: Secondary | ICD-10-CM | POA: Diagnosis not present

## 2024-11-13 DIAGNOSIS — Z0000 Encounter for general adult medical examination without abnormal findings: Secondary | ICD-10-CM | POA: Diagnosis not present

## 2024-11-13 DIAGNOSIS — Z4822 Encounter for aftercare following kidney transplant: Secondary | ICD-10-CM | POA: Diagnosis not present

## 2024-11-13 DIAGNOSIS — I10 Essential (primary) hypertension: Secondary | ICD-10-CM | POA: Diagnosis not present

## 2024-11-13 DIAGNOSIS — E785 Hyperlipidemia, unspecified: Secondary | ICD-10-CM | POA: Diagnosis not present

## 2024-11-13 DIAGNOSIS — I951 Orthostatic hypotension: Secondary | ICD-10-CM | POA: Diagnosis not present

## 2024-11-16 ENCOUNTER — Ambulatory Visit: Admitting: Physical Therapy

## 2024-11-16 ENCOUNTER — Encounter: Payer: Self-pay | Admitting: Physical Therapy

## 2024-11-16 DIAGNOSIS — R2689 Other abnormalities of gait and mobility: Secondary | ICD-10-CM | POA: Diagnosis not present

## 2024-11-16 DIAGNOSIS — H26491 Other secondary cataract, right eye: Secondary | ICD-10-CM | POA: Diagnosis not present

## 2024-11-16 DIAGNOSIS — H353131 Nonexudative age-related macular degeneration, bilateral, early dry stage: Secondary | ICD-10-CM | POA: Diagnosis not present

## 2024-11-16 DIAGNOSIS — H04123 Dry eye syndrome of bilateral lacrimal glands: Secondary | ICD-10-CM | POA: Diagnosis not present

## 2024-11-16 DIAGNOSIS — H02831 Dermatochalasis of right upper eyelid: Secondary | ICD-10-CM | POA: Diagnosis not present

## 2024-11-16 DIAGNOSIS — H02834 Dermatochalasis of left upper eyelid: Secondary | ICD-10-CM | POA: Diagnosis not present

## 2024-11-16 DIAGNOSIS — H524 Presbyopia: Secondary | ICD-10-CM | POA: Diagnosis not present

## 2024-11-16 DIAGNOSIS — H52203 Unspecified astigmatism, bilateral: Secondary | ICD-10-CM | POA: Diagnosis not present

## 2024-11-16 DIAGNOSIS — H43813 Vitreous degeneration, bilateral: Secondary | ICD-10-CM | POA: Diagnosis not present

## 2024-11-16 NOTE — Therapy (Signed)
 a OUTPATIENT PHYSICAL THERAPY LOWER EXTREMITY TREATMENT   Patient Name: Curtis Jimenez MRN: [id number] DOB:[birth year], 88 y.o., male Today's Date: 11/16/2024   END OF SESSION:  PT End of Session - 11/16/24 1213     Visit Number 4    Number of Visits 16    Date for Recertification  12/21/24    Authorization Type HTA    PT Start Time 1215    PT Stop Time 1254    PT Time Calculation (min) 39 min    Equipment Utilized During Treatment Gait belt    Activity Tolerance Patient tolerated treatment well           Past Medical History:  Diagnosis Date   Basal cell carcinoma    neck (skin MD 2X per year)   Bifascicular block 2021   ectopy, asymptomatic.  Cards->observe   Bipolar disorder (HCC)    BPH (benign prostatic hyperplasia)    with hx of elevated PSA; followed by [facility] as of 2024   Chronic renal insufficiency, stage III (moderate)    in pt w/hx of renal transplant.  Post-transplant sCr 1.4-1.6.   Coronary atherosclerosis    LM and 3 V dz noted on CT 08/2019 performed for eval of interstitial lung dz in the setting of mild DOE and mild hypoxia. Cards->no stress tesing indicated->primary prevention emphasized   CVA (cerebral vascular accident) (HCC) 11/2016   Pontine (vertebrobasilar--imaging neg), TPA given.  Pt discharged on plavix .  Carotid dopplers ok, echocardiogram ok.  Residual deficit: vertigo but this improved greatly with therapy.   DOE (dyspnea on exertion) 2020   DOE and ? hypoxia->CXR 08/29/19--->diffuse interstitial opacities-? acute inflammatory process->Dr. Darlean eval'd him and was underwhelmed by the CXR and exam->CT chest 09/26/19 Very subtle areas of mild ground-glass attenuatio-nonspecific, mild air trapping. Per Dr. Darlean, no signif ILD, improving with inc ambulation (post-inflamm pulm fibrosis?)   Elevated PSA    12/2022, mild, referred to urology   Esophagitis 06/14/2020   EGD->+distal esophagitis, h pylori neg, mild distal esoph  stenosis widened with forceps, benign gastric polyps.   GERD (gastroesophageal reflux disease)    Gout    History of adenomatous polyp of colon 2018   Recall 5 yrs   Hyperlipidemia    Hypertension    Hypothyroidism    Lumbar spondylosis    Ptosis due to aging    Right   Rectal bleeding 09/2017   Admitted for obs due to Hb drop.  GI consulted---obs recommended.  No transfusion required.  Plavix  was d/c'd, ferrous sulfate  recommended.  Outpt GI f/u--plavix  restarted and upper endoscopy was normal and colonoscopy showed 2 polyps and severe diverticular dz. Iron d/c'd 04/24/19.   Renal cyst    per pt he got MRI kidneys at the [facility] 12/2020 and the images will be viewed by Dr. Clayborn   Restless legs syndrome    S/p cadaver renal transplant 2013   Secondary to HTN +lithium toxicity over 30 yrs caused kidney destruction [facility] transplant MDs)   Seborrheic dermatitis    eyebrows worst: Hytone  rx'd by Dr. Livingston.   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   left jawline(CX35FU), Left forehead (CX35FU) right shoulder (CX35FU), R forearm   Swallowing dysfunction    thin liquids (swallowing eval 01/2023)   Past Surgical History:  Procedure Laterality Date   AV FISTULA PLACEMENT  04/08/2012   Procedure: ARTERIOVENOUS (AV) FISTULA CREATION;  Surgeon: Lonni GORMAN Blade, MD;  Location: [facility] OR;  Service:  Vascular;  Laterality: Left;  Creation of left brachial cephalic arteriovenous fistula   BIOPSY  06/14/2020   Procedure: BIOPSY;  Surgeon: San Sandor GAILS, DO;  Location: [facility];  Service: Gastroenterology;;   CATARACT EXTRACTION, BILATERAL  05/2018   CHOLECYSTECTOMY N/A 08/18/2021   Procedure: LAPAROSCOPIC CHOLECYSTECTOMY WITH CHOLANGIOGRAM;  Surgeon: Eletha Boas, MD;  Location: [facility] ORS;  Service: General;  Laterality: N/A;   COLONOSCOPY  2013; 11/24/17   2013; Normal except diverticulosis (recall 10 yrs).  2018 (for GI bleeding)--severe diverticular dz + 2 adenomatous  polyps.  Recall 5 yrs.   DG CHEST AP OR PA ([facility] HX)  06/03/2020   Bibasilar atelectasis. No focal consoliadation definitively identified.   dilation for GERD     ESOPHAGOGASTRODUODENOSCOPY  11/24/2017   gastric polyps x 2, otherwise normal.   ESOPHAGOGASTRODUODENOSCOPY N/A 06/14/2020   +distal esophagitis, h pylori neg, mild distal esoph stenosis widened with forceps, benign gastric polypsProcedure: ESOPHAGOGASTRODUODENOSCOPY (EGD);  Surgeon: San Sandor GAILS, DO;  Location: [facility];  Service: Gastroenterology;  Laterality: N/A;  Diagnostic EGD as patietn last took Plavix  11am on 06/12/2020   KIDNEY TRANSPLANT  11/06/2012   [facility] (cadaveric)   LIGATION OF ARTERIOVENOUS  FISTULA Left 10/21/2021   Procedure: LIGATION OF LEFT ARTERIOVENOUS  FISTULA;  Surgeon: Eliza Lonni RAMAN, MD;  Location: [facility] OR;  Service: Vascular;  Laterality: Left;   PROSTATE BIOPSY  2011   no malignancy   TRANSTHORACIC ECHOCARDIOGRAM  11/2016   Normal LV systolic fxn, EF 44-39%.  Grade I DD.  Mild aortic root dilatation, mild MV regurg.   Virtual colonoscopy     07/2023 NO POLYPS   Patient Active Problem List   Diagnosis Date Noted   Chronic cholecystitis without calculus 08/18/2021   Gallbladder sludge 07/22/2021   Abdominal pain, RUQ (right upper quadrant) 07/22/2021   RUQ abdominal pain 06/04/2021   Peptic stricture of esophagus    Emesis 06/13/2020   Weight loss 06/13/2020   Cough 06/13/2020   AKI (acute kidney injury) 06/13/2020   Metabolic acidosis 06/13/2020   Esophageal dysphagia    Postinflammatory pulmonary fibrosis (HCC) 08/30/2019   DOE (dyspnea on exertion) 08/29/2019   Overweight (BMI 25.0-29.9) 07/27/2019   GIB (gastrointestinal bleeding) 10/07/2017   IFG (impaired fasting glucose) 06/08/2017   Stroke (HCC) 11/30/2016   Essential hypertension 01/16/2015   S/p cadaver renal transplant 11/06/2012   Chronic kidney disease (CKD), stage IV (severe) (HCC) 03/18/2012   BENIGN PROSTATIC  HYPERTROPHY 04/18/2008   Extrinsic asthma 12/19/2007   PSA, INCREASED 12/09/2007   Hypothyroidism 08/12/2007   Bipolar disorder (HCC) 07/27/2007   Hyperlipidemia 07/07/2007   Gout 07/07/2007   Essential hypertension 07/07/2007   GERD 07/07/2007     PCP: Aleene Hockey  REFERRING PROVIDER: Aleene Hockey  REFERRING DIAG: Unstable gait   THERAPY DIAG:  Other abnormalities of gait and mobility  Rationale for Evaluation and Treatment: Rehabilitation   ONSET DATE:     SUBJECTIVE:   SUBJECTIVE STATEMENT: Pt had his eyes dilated earlier today. He states things are still looking fuzzy. Otherwise doing well.    Eval: Pt reports he Had stroke 5/6 years ago-, states decreased balance since then. Notices decreased balance in last several months. Fell 2 x in last 2 weeks. 1 walking at park, going up incline,  and once at home when standing up and turning from chair.  AD: does not use.  Does have cane and walker but does not use.  Gets dizzy when getting up and down at  church, but states not at other times. Thinks it is when he sings.  BP - states his meds make it low. Home: Lives with wife. Steps: has 1,  2 in step to get in, with no hand rail.  Driving: Yes, he drives wife who has bad vision.  Recent chest pain- found to be non cardiac- was coughing a lot.  Exercise: 1x/wk tries to walk at some of the parks. Usually goes for 20 min.  [facility] 1[facility]5 [facility].  Denies SOB or dizziness with walking    PERTINENT HISTORY: Stroke, Kidney transplant, HTN,    PAIN:  Are you having pain? No   PRECAUTIONS: Fall  WEIGHT BEARING RESTRICTIONS: No  FALLS:  Has patient fallen in last 6 months? Yes. Number of falls 2   PLOF: Independent  PATIENT GOALS:  improve balance, decrease falls.   NEXT MD VISIT:   OBJECTIVE:   DIAGNOSTIC FINDINGS:   PATIENT SURVEYS:    COGNITION: Overall cognitive status: Within functional limits for tasks  assessed     SENSATION: WFL  EDEMA:  n/a    POSTURE:    No Significant postural limitations  PALPATION:   LOWER EXTREMITY ROM: Hips: WFL, Knees: WFL   LOWER EXTREMITY MMT:  MMT Left eval Right  eval  Hip flexion 4 4  Hip extension    Hip abduction    Hip adduction    Hip internal rotation    Hip external rotation    Knee flexion 4 4  Knee extension 4+ 4+  Ankle dorsiflexion    Ankle plantarflexion    Ankle inversion    Ankle eversion     (Blank rows = not tested)  LOWER EXTREMITY SPECIAL TESTS:   Seated: eye gaze-   and head turns - no dizziness elicited States looking back and forth when standing can make him dizzy.    FUNCTIONAL TESTS:  5 time sit to stand: 30.22 sec  TUG:  15.13- used hands to rise from chair  BERG:    GAIT: Distance walked: 150 Assistive device utilized: None Level of assistance: Complete Independence Comments: decreased balance, slow speed, likley would benefit from use of SPC   TODAY'S TREATMENT:                                                                                                                              DATE:   11/16/2024 Therapeutic Exercise: Aerobic: Supine: Seated: ankle pumps x 15,    sit to stand- higher mat table 2 x 5  Standing:  Heel raises x 15 at counter;   Stretches:  Neuromuscular Re-education: Staggered stance weight shifts x 20 bil  Reaching outside of bos  L and R x 10 bil; L and R weight shifts x 10;  Tandem stance(slight modified ) with head turns 2 x 10 bil  Standing torso turns x10 bil; Toe taps 2 risers 2 x 10 at counter  Side stepping at counter x 10- No UE support  Gait: ambulation with practice for direction changes 45 ft x 5;  Education on use of SPC 45 ft x 6 - for longer distances and uneven surfaces.  Manual Therapy:  Therapeutic Activity: Self Care:    Therapeutic Exercise: Aerobic: Supine: Seated: ankle pumps x 15,  sit to stand- higher mat table x 10;    Standing:   Heel raises x 15 at counter;   Stretches:  Neuromuscular Re-education: Staggered stance weight shifts x 20 bil (x10 with same side reaching) Reaching outside of bos x 10 bil;  Tandem stance(slight modified ) 20 sec x 2 bil at counter.  Standing torso turns x10 bil; Bending to pick object up from floor x 3 bil- cueing for foot and head placement.  Manual Therapy:  Therapeutic Activity: Self Care:     Therapeutic Exercise: Aerobic: Supine: Seated: ankle pumps x 15, sit to stand- higher mat table x 10;  LAQ 2 x 10 bil;  Standing:  Heel raises x 15 at counter;   Stretches:  Neuromuscular Re-education: Staggered stance weight shifts x 20 bil (x10 with same side reaching) Reaching outside of bos x 10 bil;  Tandem stance(slight modified ) 20 sec x 2 bil at counter.  Standing torso turns x10 bil; Manual Therapy: Therapeutic Activity: Self Care:    PATIENT EDUCATION:  Education details: updated and reviewed HEP Person educated: Patient Education method: Explanation, Demonstration, Tactile cues, Verbal cues, and Handouts Education comprehension: verbalized understanding, returned demonstration, verbal cues required, tactile cues required, and needs further education   HOME EXERCISE PROGRAM: Access Code: [email or web address] URL: [email or web address] Date: 11/02/2024 Prepared by: Tinnie Don  Exercises - Seated Knee Extension AROM  - 1 x daily - 2 sets - 10 reps - Seated Ankle Pumps on Table  - 1 x daily - 1-2 sets - 10 reps - Sit to Stand  - 1 x daily - 1 sets - 10 reps - Heel Raises with Counter Support  - 1 x daily - 1- 2 sets - 10 reps  ASSESSMENT:  CLINICAL IMPRESSION: Pt challenged with balance exercises, but showing improvements in stability and range for going outside of BOS from previous visits. Plan to progress more dynamic balance as tolerated.   Eval: Patient presents with primary complaint of  decreased balance. He has decreased sores on berg,and 5 time  sit to stand. He will benefit from practice with head turns in standing and with walking, and work on dynamic balance, to improve safety with his outdoor walking.  Pt with decreased ability for full functional activities. Pt will  benefit from skilled PT to improve deficits and pain and to return to PLOF.   OBJECTIVE IMPAIRMENTS: Abnormal gait, decreased activity tolerance, decreased balance, decreased knowledge of use of DME, decreased mobility, decreased strength, impaired vision/preception, improper body mechanics, and pain.   ACTIVITY LIMITATIONS: bending, standing, squatting, stairs, transfers, and locomotion level  PARTICIPATION LIMITATIONS: meal prep, cleaning, shopping, and community activity  PERSONAL FACTORS: Time since onset of injury/illness/exacerbation and 1 comorbidity: stroke,  are also affecting patient's functional outcome.   REHAB POTENTIAL: Good  CLINICAL DECISION MAKING: Evolving/moderate complexity  EVALUATION COMPLEXITY: Moderate   GOALS: Goals reviewed with patient? Yes  SHORT TERM GOALS: Target date: 11/16/2024   Pt to be independent with initial HEP  Goal status: MET  2.  Pt to demo ability to rise from higher mat table, without use of UEs.   Goal status: MET  LONG TERM GOALS: Target date: 12/21/2024   Pt to be independent with final HEP  Goal status: INITIAL  2.  Pt to improve score on 5 time sit to stand by at least 10 sec.   Goal status: INITIAL  3.  Pt to improve score on BERG by at least 5 points.   Goal status: INITIAL  4.  Pt to demo ambulation on level surfaces to be [facility] , with dynamic movement, head turns and direction changes .  Goal status: INITIAL     PLAN:  PT FREQUENCY: 1-2x/week  PT DURATION: 8 weeks  PLANNED INTERVENTIONS: Therapeutic exercises, Therapeutic activity, Neuromuscular re-education, Patient/Family education, Self Care, Joint mobilization, Joint manipulation, Stair training, Orthotic/Fit training,  DME instructions, Aquatic Therapy, Dry Needling, Electrical stimulation, Cryotherapy, Moist heat, Taping, Ultrasound, Ionotophoresis 4mg /ml Dexamethasone , Manual therapy,  Vasopneumatic device, Traction, Spinal manipulation, Spinal mobilization,Balance training, Gait training,   PLAN FOR NEXT SESSION: more challenged on R, static balance, reaching outside bos,  toe taps, step ups .  Tinnie Don, PT, DPT 12:54 PM  11/16/24

## 2024-11-22 ENCOUNTER — Encounter: Payer: Self-pay | Admitting: Physical Therapy

## 2024-11-22 ENCOUNTER — Ambulatory Visit: Admitting: Physical Therapy

## 2024-11-22 DIAGNOSIS — R2689 Other abnormalities of gait and mobility: Secondary | ICD-10-CM

## 2024-11-22 NOTE — Therapy (Signed)
 OUTPATIENT PHYSICAL THERAPY LOWER EXTREMITY TREATMENT   Patient Name: Curtis Jimenez MRN: [id number] DOB:Jun 23, 1936, 88 y.o., male Today's Date: 11/22/2024   END OF SESSION:  PT End of Session - 11/22/24 0846     Visit Number 5    Number of Visits 16    Date for Recertification  12/21/24    Authorization Type HTA    PT Start Time 0848    PT Stop Time 0926    PT Time Calculation (min) 38 min    Equipment Utilized During Treatment Gait belt    Activity Tolerance Patient tolerated treatment well           Past Medical History:  Diagnosis Date   Basal cell carcinoma    neck (skin MD 2X per year)   Bifascicular block 2021   ectopy, asymptomatic.  Cards->observe   Bipolar disorder (HCC)    BPH (benign prostatic hyperplasia)    with hx of elevated PSA; followed by [facility] as of 2024   Chronic renal insufficiency, stage III (moderate)    in pt w/hx of renal transplant.  Post-transplant sCr 1.4-1.6.   Coronary atherosclerosis    LM and 3 V dz noted on CT 08/2019 performed for eval of interstitial lung dz in the setting of mild DOE and mild hypoxia. Cards->no stress tesing indicated->primary prevention emphasized   CVA (cerebral vascular accident) (HCC) 11/2016   Pontine (vertebrobasilar--imaging neg), TPA given.  Pt discharged on plavix .  Carotid dopplers ok, echocardiogram ok.  Residual deficit: vertigo but this improved greatly with therapy.   DOE (dyspnea on exertion) 2020   DOE and ? hypoxia->CXR 08/29/19--->diffuse interstitial opacities-? acute inflammatory process->Dr. Darlean eval'd him and was underwhelmed by the CXR and exam->CT chest 09/26/19 Very subtle areas of mild ground-glass attenuatio-nonspecific, mild air trapping. Per Dr. Darlean, no signif ILD, improving with inc ambulation (post-inflamm pulm fibrosis?)   Elevated PSA    12/2022, mild, referred to urology   Esophagitis 06/14/2020   EGD->+distal esophagitis, h pylori neg, mild distal esoph stenosis  widened with forceps, benign gastric polyps.   GERD (gastroesophageal reflux disease)    Gout    History of adenomatous polyp of colon 2018   Recall 5 yrs   Hyperlipidemia    Hypertension    Hypothyroidism    Lumbar spondylosis    Ptosis due to aging    Right   Rectal bleeding 09/2017   Admitted for obs due to Hb drop.  GI consulted---obs recommended.  No transfusion required.  Plavix  was d/c'd, ferrous sulfate  recommended.  Outpt GI f/u--plavix  restarted and upper endoscopy was normal and colonoscopy showed 2 polyps and severe diverticular dz. Iron d/c'd 04/24/19.   Renal cyst    per pt he got MRI kidneys at the [facility] 12/2020 and the images will be viewed by Dr. Clayborn   Restless legs syndrome    S/p cadaver renal transplant 2013   Secondary to HTN +lithium toxicity over 30 yrs caused kidney destruction [facility] transplant MDs)   Seborrheic dermatitis    eyebrows worst: Hytone  rx'd by Dr. Livingston.   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   left jawline(CX35FU), Left forehead (CX35FU) right shoulder (CX35FU), R forearm   Swallowing dysfunction    thin liquids (swallowing eval 01/2023)   Past Surgical History:  Procedure Laterality Date   AV FISTULA PLACEMENT  04/08/2012   Procedure: ARTERIOVENOUS (AV) FISTULA CREATION;  Surgeon: Lonni GORMAN Blade, MD;  Location: [facility] OR;  Service: Vascular;  Laterality: Left;  Creation of left brachial cephalic arteriovenous fistula   BIOPSY  06/14/2020   Procedure: BIOPSY;  Surgeon: San Sandor GAILS, DO;  Location: [facility];  Service: Gastroenterology;;   CATARACT EXTRACTION, BILATERAL  05/2018   CHOLECYSTECTOMY N/A 08/18/2021   Procedure: LAPAROSCOPIC CHOLECYSTECTOMY WITH CHOLANGIOGRAM;  Surgeon: Eletha Boas, MD;  Location: [facility] ORS;  Service: General;  Laterality: N/A;   COLONOSCOPY  2013; 11/24/17   2013; Normal except diverticulosis (recall 10 yrs).  2018 (for GI bleeding)--severe diverticular dz + 2 adenomatous polyps.   Recall 5 yrs.   DG CHEST AP OR PA ([facility] HX)  06/03/2020   Bibasilar atelectasis. No focal consoliadation definitively identified.   dilation for GERD     ESOPHAGOGASTRODUODENOSCOPY  11/24/2017   gastric polyps x 2, otherwise normal.   ESOPHAGOGASTRODUODENOSCOPY N/A 06/14/2020   +distal esophagitis, h pylori neg, mild distal esoph stenosis widened with forceps, benign gastric polypsProcedure: ESOPHAGOGASTRODUODENOSCOPY (EGD);  Surgeon: San Sandor GAILS, DO;  Location: [facility];  Service: Gastroenterology;  Laterality: N/A;  Diagnostic EGD as patietn last took Plavix  11am on 06/12/2020   KIDNEY TRANSPLANT  11/06/2012   [facility] (cadaveric)   LIGATION OF ARTERIOVENOUS  FISTULA Left 10/21/2021   Procedure: LIGATION OF LEFT ARTERIOVENOUS  FISTULA;  Surgeon: Eliza Lonni RAMAN, MD;  Location: [facility] OR;  Service: Vascular;  Laterality: Left;   PROSTATE BIOPSY  2011   no malignancy   TRANSTHORACIC ECHOCARDIOGRAM  11/2016   Normal LV systolic fxn, EF 44-39%.  Grade I DD.  Mild aortic root dilatation, mild MV regurg.   Virtual colonoscopy     07/2023 NO POLYPS   Patient Active Problem List   Diagnosis Date Noted   Chronic cholecystitis without calculus 08/18/2021   Gallbladder sludge 07/22/2021   Abdominal pain, RUQ (right upper quadrant) 07/22/2021   RUQ abdominal pain 06/04/2021   Peptic stricture of esophagus    Emesis 06/13/2020   Weight loss 06/13/2020   Cough 06/13/2020   AKI (acute kidney injury) 06/13/2020   Metabolic acidosis 06/13/2020   Esophageal dysphagia    Postinflammatory pulmonary fibrosis (HCC) 08/30/2019   DOE (dyspnea on exertion) 08/29/2019   Overweight (BMI 25.0-29.9) 07/27/2019   GIB (gastrointestinal bleeding) 10/07/2017   IFG (impaired fasting glucose) 06/08/2017   Stroke (HCC) 11/30/2016   Essential hypertension 01/16/2015   S/p cadaver renal transplant 11/06/2012   Chronic kidney disease (CKD), stage IV (severe) (HCC) 03/18/2012   BENIGN PROSTATIC  HYPERTROPHY 04/18/2008   Extrinsic asthma 12/19/2007   PSA, INCREASED 12/09/2007   Hypothyroidism 08/12/2007   Bipolar disorder (HCC) 07/27/2007   Hyperlipidemia 07/07/2007   Gout 07/07/2007   Essential hypertension 07/07/2007   GERD 07/07/2007     PCP: Aleene Hockey  REFERRING PROVIDER: Aleene Hockey  REFERRING DIAG: Unstable gait   THERAPY DIAG:  Other abnormalities of gait and mobility  Rationale for Evaluation and Treatment: Rehabilitation   ONSET DATE:     SUBJECTIVE:   SUBJECTIVE STATEMENT: Pt with no new complaints.    Eval: Pt reports he Had stroke 5/6 years ago-, states decreased balance since then. Notices decreased balance in last several months. Fell 2 x in last 2 weeks. 1 walking at park, going up incline,  and once at home when standing up and turning from chair.  AD: does not use.  Does have cane and walker but does not use.  Gets dizzy when getting up and down at church, but states not at other times. Thinks it is when he sings.  BP - states his meds make it low. Home: Lives with wife. Steps: has 1,  2 in step to get in, with no hand rail.  Driving: Yes, he drives wife who has bad vision.  Recent chest pain- found to be non cardiac- was coughing a lot.  Exercise: 1x/wk tries to walk at some of the parks. Usually goes for 20 min.  [facility] 1[facility].  Denies SOB or dizziness with walking    PERTINENT HISTORY: Stroke, Kidney transplant, HTN,    PAIN:  Are you having pain? No   PRECAUTIONS: Fall  WEIGHT BEARING RESTRICTIONS: No  FALLS:  Has patient fallen in last 6 months? Yes. Number of falls 2   PLOF: Independent  PATIENT GOALS:  improve balance, decrease falls.   NEXT MD VISIT:   OBJECTIVE:   DIAGNOSTIC FINDINGS:   PATIENT SURVEYS:    COGNITION: Overall cognitive status: Within functional limits for tasks assessed     SENSATION: WFL  EDEMA:  n/a    POSTURE:    No Significant  postural limitations  PALPATION:   LOWER EXTREMITY ROM: Hips: WFL, Knees: WFL   LOWER EXTREMITY MMT:  MMT Left eval Right  eval  Hip flexion 4 4  Hip extension    Hip abduction    Hip adduction    Hip internal rotation    Hip external rotation    Knee flexion 4 4  Knee extension 4+ 4+  Ankle dorsiflexion    Ankle plantarflexion    Ankle inversion    Ankle eversion     (Blank rows = not tested)  LOWER EXTREMITY SPECIAL TESTS:   Seated: eye gaze-   and head turns - no dizziness elicited States looking back and forth when standing can make him dizzy.    FUNCTIONAL TESTS:  5 time sit to stand: 30.22 sec  TUG:  15.13- used hands to rise from chair  BERG:    GAIT: Distance walked: 150 Assistive device utilized: None Level of assistance: Complete Independence Comments: decreased balance, slow speed, likley would benefit from use of SPC   TODAY'S TREATMENT:                                                                                                                              DATE:   11/22/2024 Therapeutic Exercise: Aerobic: Supine: Seated: Marching x 15;  sit to stand- higher mat table 2 x 5  Standing:  Heel raises x 15 at counter;  hip abd 2 x 10  Stretches:  Neuromuscular Re-education: Staggered stance weight shifts x 20 bil  Step throughs x 15 bil;  Tandem stance(slight modified ) x2 bil- education for HEP Toe taps 2 risers 2 x 10 at counter ( no Ue support) cueing for slowing down speed and stopping when tired.  Slow march for balance x 20  Gait: ambulation with practice for direction changes 45 ft x 8 cueing  for not crossing foot over when changing directions ;   Manual Therapy:  Therapeutic Activity: Self Care:    Therapeutic Exercise: Aerobic: Supine: Seated: ankle pumps x 15,  sit to stand- higher mat table x 10;    Standing:  Heel raises x 15 at counter;   Stretches:  Neuromuscular Re-education: Staggered stance weight shifts x 20  bil (x10 with same side reaching) Reaching outside of bos x 10 bil;  Tandem stance(slight modified ) 20 sec x 2 bil at counter.  Standing torso turns x10 bil; Bending to pick object up from floor x 3 bil- cueing for foot and head placement.  Manual Therapy:  Therapeutic Activity: Self Care:    Therapeutic Exercise: Aerobic: Supine: Seated: ankle pumps x 15, sit to stand- higher mat table x 10;  LAQ 2 x 10 bil;  Standing:  Heel raises x 15 at counter;   Stretches:  Neuromuscular Re-education: Staggered stance weight shifts x 20 bil (x10 with same side reaching) Reaching outside of bos x 10 bil;  Tandem stance(slight modified ) 20 sec x 2 bil at counter.  Standing torso turns x10 bil; Manual Therapy: Therapeutic Activity: Self Care:    PATIENT EDUCATION:  Education details: updated and reviewed HEP Person educated: Patient Education method: Explanation, Demonstration, Tactile cues, Verbal cues, and Handouts Education comprehension: verbalized understanding, returned demonstration, verbal cues required, tactile cues required, and needs further education   HOME EXERCISE PROGRAM: Access Code: [email or web address] URL: [email or web address] Date: 11/02/2024 Prepared by: Tinnie Don  Exercises - Seated Knee Extension AROM  - 1 x daily - 2 sets - 10 reps - Seated Ankle Pumps on Table  - 1 x daily - 1-2 sets - 10 reps - Sit to Stand  - 1 x daily - 1 sets - 10 reps - Heel Raises with Counter Support  - 1 x daily - 1- 2 sets - 10 reps  ASSESSMENT:  CLINICAL IMPRESSION: Pt challenged with all balance exercises today. Cueing for using vision for dynamic tasks, as well as for not crossing feet with direction changes. He is wearing sneakers today that have bigger more cushioned sole, and seem to be more challenging to clear feet, especially the R from the floor today. Discussed wearing other shoes for less chance of tripping.   Eval: Patient presents with primary complaint  of  decreased balance. He has decreased sores on berg,and 5 time sit to stand. He will benefit from practice with head turns in standing and with walking, and work on dynamic balance, to improve safety with his outdoor walking.  Pt with decreased ability for full functional activities. Pt will  benefit from skilled PT to improve deficits and pain and to return to PLOF.   OBJECTIVE IMPAIRMENTS: Abnormal gait, decreased activity tolerance, decreased balance, decreased knowledge of use of DME, decreased mobility, decreased strength, impaired vision/preception, improper body mechanics, and pain.   ACTIVITY LIMITATIONS: bending, standing, squatting, stairs, transfers, and locomotion level  PARTICIPATION LIMITATIONS: meal prep, cleaning, shopping, and community activity  PERSONAL FACTORS: Time since onset of injury/illness/exacerbation and 1 comorbidity: stroke,  are also affecting patient's functional outcome.   REHAB POTENTIAL: Good  CLINICAL DECISION MAKING: Evolving/moderate complexity  EVALUATION COMPLEXITY: Moderate   GOALS: Goals reviewed with patient? Yes  SHORT TERM GOALS: Target date: 11/16/2024   Pt to be independent with initial HEP  Goal status: MET  2.  Pt to demo ability to rise from higher mat table, without use of UEs.   Goal status:  MET     LONG TERM GOALS: Target date: 12/21/2024   Pt to be independent with final HEP  Goal status: INITIAL  2.  Pt to improve score on 5 time sit to stand by at least 10 sec.   Goal status: INITIAL  3.  Pt to improve score on BERG by at least 5 points.   Goal status: INITIAL  4.  Pt to demo ambulation on level surfaces to be [facility] , with dynamic movement, head turns and direction changes .  Goal status: INITIAL     PLAN:  PT FREQUENCY: 1-2x/week  PT DURATION: 8 weeks  PLANNED INTERVENTIONS: Therapeutic exercises, Therapeutic activity, Neuromuscular re-education, Patient/Family education, Self Care, Joint  mobilization, Joint manipulation, Stair training, Orthotic/Fit training, DME instructions, Aquatic Therapy, Dry Needling, Electrical stimulation, Cryotherapy, Moist heat, Taping, Ultrasound, Ionotophoresis 4mg /ml Dexamethasone , Manual therapy,  Vasopneumatic device, Traction, Spinal manipulation, Spinal mobilization,Balance training, Gait training,   PLAN FOR NEXT SESSION: more challenged on R, static balance, reaching outside bos,  toe taps, step ups .  Tinnie Don, PT, DPT 9:24 AM  11/22/24

## 2024-11-25 ENCOUNTER — Ambulatory Visit: Payer: Self-pay | Admitting: Cardiology

## 2024-11-30 ENCOUNTER — Ambulatory Visit: Admitting: Physical Therapy

## 2024-11-30 ENCOUNTER — Encounter: Payer: Self-pay | Admitting: Physical Therapy

## 2024-11-30 DIAGNOSIS — R2689 Other abnormalities of gait and mobility: Secondary | ICD-10-CM

## 2024-11-30 NOTE — Therapy (Signed)
**Note Curtis-Identified via Obfuscation**  OUTPATIENT PHYSICAL THERAPY LOWER EXTREMITY TREATMENT   Patient Name: Curtis Jimenez MRN: [id number] DOB:[birth year], 88 y.o., male Today's Date: 11/30/2024   END OF SESSION:  PT End of Session - 11/30/24 1301     Visit Number 6    Number of Visits 16    Date for Recertification  12/21/24    Authorization Type HTA    PT Start Time 1303    PT Stop Time 1345    PT Time Calculation (min) 42 min    Equipment Utilized During Treatment Gait belt    Activity Tolerance Patient tolerated treatment well           Past Medical History:  Diagnosis Date   Basal cell carcinoma    neck (skin MD 2X per year)   Bifascicular block 2021   ectopy, asymptomatic.  Cards->observe   Bipolar disorder (HCC)    BPH (benign prostatic hyperplasia)    with hx of elevated PSA; followed by [facility] as of 2024   Chronic renal insufficiency, stage III (moderate)    in pt w/hx of renal transplant.  Post-transplant sCr 1.4-1.6.   Coronary atherosclerosis    LM and 3 V dz noted on CT 08/2019 performed for eval of interstitial lung dz in the setting of mild DOE and mild hypoxia. Cards->no stress tesing indicated->primary prevention emphasized   CVA (cerebral vascular accident) (HCC) 11/2016   Pontine (vertebrobasilar--imaging neg), TPA given.  Pt discharged on plavix .  Carotid dopplers ok, echocardiogram ok.  Residual deficit: vertigo but this improved greatly with therapy.   DOE (dyspnea on exertion) 2020   DOE and ? hypoxia->CXR 08/29/19--->diffuse interstitial opacities-? acute inflammatory process->Dr. Darlean eval'd him and was underwhelmed by the CXR and exam->CT chest 09/26/19 Very subtle areas of mild ground-glass attenuatio-nonspecific, mild air trapping. Per Dr. Darlean, no signif ILD, improving with inc ambulation (post-inflamm pulm fibrosis?)   Elevated PSA    12/2022, mild, referred to urology   Esophagitis 06/14/2020   EGD->+distal esophagitis, h pylori neg, mild distal esoph stenosis  widened with forceps, benign gastric polyps.   GERD (gastroesophageal reflux disease)    Gout    History of adenomatous polyp of colon 2018   Recall 5 yrs   Hyperlipidemia    Hypertension    Hypothyroidism    Lumbar spondylosis    Ptosis due to aging    Right   Rectal bleeding 09/2017   Admitted for obs due to Hb drop.  GI consulted---obs recommended.  No transfusion required.  Plavix  was d/c'd, ferrous sulfate  recommended.  Outpt GI f/u--plavix  restarted and upper endoscopy was normal and colonoscopy showed 2 polyps and severe diverticular dz. Iron d/c'd 04/24/19.   Renal cyst    per pt he got MRI kidneys at the [facility] 12/2020 and the images will be viewed by Dr. Clayborn   Restless legs syndrome    S/p cadaver renal transplant 2013   Secondary to HTN +lithium toxicity over 30 yrs caused kidney destruction [facility] transplant MDs)   Seborrheic dermatitis    eyebrows worst: Hytone  rx'd by Dr. Livingston.   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   left jawline(CX35FU), Left forehead (CX35FU) right shoulder (CX35FU), R forearm   Swallowing dysfunction    thin liquids (swallowing eval 01/2023)   Past Surgical History:  Procedure Laterality Date   AV FISTULA PLACEMENT  04/08/2012   Procedure: ARTERIOVENOUS (AV) FISTULA CREATION;  Surgeon: Lonni GORMAN Blade, MD;  Location: [facility] OR;  Service: Vascular;  Laterality: Left;  Creation of left brachial cephalic arteriovenous fistula   BIOPSY  06/14/2020   Procedure: BIOPSY;  Surgeon: San Sandor GAILS, DO;  Location: [facility];  Service: Gastroenterology;;   CATARACT EXTRACTION, BILATERAL  05/2018   CHOLECYSTECTOMY N/A 08/18/2021   Procedure: LAPAROSCOPIC CHOLECYSTECTOMY WITH CHOLANGIOGRAM;  Surgeon: Eletha Boas, MD;  Location: [facility] ORS;  Service: General;  Laterality: N/A;   COLONOSCOPY  2013; 11/24/17   2013; Normal except diverticulosis (recall 10 yrs).  2018 (for GI bleeding)--severe diverticular dz + 2 adenomatous polyps.   Recall 5 yrs.   DG CHEST AP OR PA ([facility] HX)  06/03/2020   Bibasilar atelectasis. No focal consoliadation definitively identified.   dilation for GERD     ESOPHAGOGASTRODUODENOSCOPY  11/24/2017   gastric polyps x 2, otherwise normal.   ESOPHAGOGASTRODUODENOSCOPY N/A 06/14/2020   +distal esophagitis, h pylori neg, mild distal esoph stenosis widened with forceps, benign gastric polypsProcedure: ESOPHAGOGASTRODUODENOSCOPY (EGD);  Surgeon: San Sandor GAILS, DO;  Location: [facility];  Service: Gastroenterology;  Laterality: N/A;  Diagnostic EGD as patietn last took Plavix  11am on 06/12/2020   KIDNEY TRANSPLANT  11/06/2012   [facility] (cadaver[facility])   LIGATION OF ARTERIOVENOUS  FISTULA Left 10/21/2021   Procedure: LIGATION OF LEFT ARTERIOVENOUS  FISTULA;  Surgeon: Eliza Lonni RAMAN, MD;  Location: [facility] OR;  Service: Vascular;  Laterality: Left;   PROSTATE BIOPSY  2011   no malignancy   TRANSTHORACIC ECHOCARDIOGRAM  11/2016   Normal LV systolic fxn, EF 44-39%.  Grade I DD.  Mild aortic root dilatation, mild MV regurg.   Virtual colonoscopy     07/2023 NO POLYPS   Patient Active Problem List   Diagnosis Date Noted   Chronic cholecystitis without calculus 08/18/2021   Gallbladder sludge 07/22/2021   Abdominal pain, RUQ (right upper quadrant) 07/22/2021   RUQ abdominal pain 06/04/2021   Peptic stricture of esophagus    Emesis 06/13/2020   Weight loss 06/13/2020   Cough 06/13/2020   AKI (acute kidney injury) 06/13/2020   Metabolic acidosis 06/13/2020   Esophageal dysphagia    Postinflammatory pulmonary fibrosis (HCC) 08/30/2019   DOE (dyspnea on exertion) 08/29/2019   Overweight (BMI 25.0-29.9) 07/27/2019   GIB (gastrointestinal bleeding) 10/07/2017   IFG (impaired fasting glucose) 06/08/2017   Stroke (HCC) 11/30/2016   Essential hypertension 01/16/2015   S/p cadaver renal transplant 11/06/2012   Chronic kidney disease (CKD), stage IV (severe) (HCC) 03/18/2012   BENIGN PROSTATIC  HYPERTROPHY 04/18/2008   Extrinsic asthma 12/19/2007   PSA, INCREASED 12/09/2007   Hypothyroidism 08/12/2007   Bipolar disorder (HCC) 07/27/2007   Hyperlipidemia 07/07/2007   Gout 07/07/2007   Essential hypertension 07/07/2007   GERD 07/07/2007     PCP: Aleene Hockey  REFERRING PROVIDER: Aleene Hockey  REFERRING DIAG: Unstable gait   THERAPY DIAG:  Other abnormalities of gait and mobility  Rationale for Evaluation and Treatment: Rehabilitation   ONSET DATE:     SUBJECTIVE:   SUBJECTIVE STATEMENT: Pt with no new complaints.   Eval: Pt reports he Had stroke 5/6 years ago-, states decreased balance since then. Notices decreased balance in last several months. Fell 2 x in last 2 weeks. 1 walking at park, going up incline,  and once at home when standing up and turning from chair.  AD: does not use.  Does have cane and walker but does not use.  Gets dizzy when getting up and down at church, but states not at other times. Thinks it is when he sings.  BP -  states his meds make it low. Home: Lives with wife. Steps: has 1,  2 in step to get in, with no hand rail.  Driving: Yes, he drives wife who has bad vision.  Recent chest pain- found to be non cardiac- was coughing a lot.  Exercise: 1x/wk tries to walk at some of the parks. Usually goes for 20 min.  [facility] 1[facility].  Denies SOB or dizziness with walking    PERTINENT HISTORY: Stroke, Kidney transplant, HTN,    PAIN:  Are you having pain? No   PRECAUTIONS: Fall  WEIGHT BEARING RESTRICTIONS: No  FALLS:  Has patient fallen in last 6 months? Yes. Number of falls 2   PLOF: Independent  PATIENT GOALS:  improve balance, decrease falls.   NEXT MD VISIT:   OBJECTIVE:   DIAGNOSTIC FINDINGS:   PATIENT SURVEYS:    COGNITION: Overall cognitive status: Within functional limits for tasks assessed     SENSATION: WFL  EDEMA:  n/a    POSTURE:    No Significant  postural limitations  PALPATION:   LOWER EXTREMITY ROM: Hips: WFL, Knees: WFL   LOWER EXTREMITY MMT:  MMT Left eval Right  eval  Hip flexion 4 4  Hip extension    Hip abduction    Hip adduction    Hip internal rotation    Hip external rotation    Knee flexion 4 4  Knee extension 4+ 4+  Ankle dorsiflexion    Ankle plantarflexion    Ankle inversion    Ankle eversion     (Blank rows = not tested)  LOWER EXTREMITY SPECIAL TESTS:   Seated: eye gaze-   and head turns - no dizziness elicited States looking back and forth when standing can make him dizzy.    FUNCTIONAL TESTS:  5 time sit to stand: 30.22 sec  TUG:  15.13- used hands to rise from chair  BERG:    GAIT: Distance walked: 150 Assistive device utilized: None Level of assistance: Complete Independence Comments: decreased balance, slow speed, likley would benefit from use of SPC   TODAY'S TREATMENT:                                                                                                                              DATE:   11/30/2024 Therapeutic Exercise: Aerobic: Supine: Seated:  Standing:   Stretches:  Neuromuscular Re-education: Staggered stance weight shifts x 20 bil  Tandem stance(slight modified ) x30 sec,  then with head turns x 10 bil;  Toe taps 2 risers 2 x 10 at counter ( no UE support) cueing for slowing down speed and stopping when tired.  Slow march for balance x 20 with support and without UE support  Lateral and fwd stepping with weight shifts x 10 ea bil;  ambulation with practice for direction changes 45 ft x 8 cueing for not crossing foot over when changing directions ;  Gait:  Manual Therapy:  Therapeutic Activity:  Marching x 15;   sit to stand- higher mat table  x 10,  regular chair height x 10- no hands.  Heel raises x 15 at counter;   hip abd 2 x 10  Stretches:  Self Care:    Therapeutic Exercise: Aerobic: Supine: Seated: ankle pumps x 15,  sit to stand-  higher mat table x 10;    Standing:  Heel raises x 15 at counter;   Stretches:  Neuromuscular Re-education: Staggered stance weight shifts x 20 bil (x10 with same side reaching) Reaching outside of bos x 10 bil;  Tandem stance(slight modified ) 20 sec x 2 bil at counter.  Standing torso turns x10 bil; Bending to pick object up from floor x 3 bil- cueing for foot and head placement.  Manual Therapy:  Therapeutic Activity: Self Care:    Therapeutic Exercise: Aerobic: Supine: Seated: ankle pumps x 15, sit to stand- higher mat table x 10;  LAQ 2 x 10 bil;  Standing:  Heel raises x 15 at counter;   Stretches:  Neuromuscular Re-education: Staggered stance weight shifts x 20 bil (x10 with same side reaching) Reaching outside of bos x 10 bil;  Tandem stance(slight modified ) 20 sec x 2 bil at counter.  Standing torso turns x10 bil; Manual Therapy: Therapeutic Activity: Self Care:    PATIENT EDUCATION:  Education details: updated and reviewed HEP Person educated: Patient Education method: Explanation, Demonstration, Tactile cues, Verbal cues, and Handouts Education comprehension: verbalized understanding, returned demonstration, verbal cues required, tactile cues required, and needs further education   HOME EXERCISE PROGRAM: Access Code: [email or web address] URL: [email or web address] Date: 11/02/2024 Prepared by: Tinnie Don  Exercises - Seated Knee Extension AROM  - 1 x daily - 2 sets - 10 reps - Seated Ankle Pumps on Table  - 1 x daily - 1-2 sets - 10 reps - Sit to Stand  - 1 x daily - 1 sets - 10 reps - Heel Raises with Counter Support  - 1 x daily - 1- 2 sets - 10 reps  ASSESSMENT:  CLINICAL IMPRESSION: Pt challenged with all balance exercises today. Cueing for improving foot clearance with most activities today. He demonstrates improved footing with direction changes today vs last session, with no foot cross over. He will benefit from continued dynamic balance.    Eval: Patient presents with primary complaint of  decreased balance. He has decreased sores on berg,and 5 time sit to stand. He will benefit from practice with head turns in standing and with walking, and work on dynamic balance, to improve safety with his outdoor walking.  Pt with decreased ability for full functional activities. Pt will  benefit from skilled PT to improve deficits and pain and to return to PLOF.   OBJECTIVE IMPAIRMENTS: Abnormal gait, decreased activity tolerance, decreased balance, decreased knowledge of use of DME, decreased mobility, decreased strength, impaired vision/preception, improper body mechanics, and pain.   ACTIVITY LIMITATIONS: bending, standing, squatting, stairs, transfers, and locomotion level  PARTICIPATION LIMITATIONS: meal prep, cleaning, shopping, and community activity  PERSONAL FACTORS: Time since onset of injury/illness/exacerbation and 1 comorbidity: stroke,  are also affecting patient's functional outcome.   REHAB POTENTIAL: Good  CLINICAL DECISION MAKING: Evolving/moderate complexity  EVALUATION COMPLEXITY: Moderate   GOALS: Goals reviewed with patient? Yes  SHORT TERM GOALS: Target date: 11/16/2024   Pt to be independent with initial HEP  Goal status: MET  2.  Pt to demo ability to rise from higher  mat table, without use of UEs.   Goal status: MET     LONG TERM GOALS: Target date: 12/21/2024   Pt to be independent with final HEP  Goal status: INITIAL  2.  Pt to improve score on 5 time sit to stand by at least 10 sec.   Goal status: INITIAL  3.  Pt to improve score on BERG by at least 5 points.   Goal status: INITIAL  4.  Pt to demo ambulation on level surfaces to be [facility] , with dynamic movement, head turns and direction changes .  Goal status: INITIAL     PLAN:  PT FREQUENCY: 1-2x/week  PT DURATION: 8 weeks  PLANNED INTERVENTIONS: Therapeutic exercises, Therapeutic activity, Neuromuscular re-education,  Patient/Family education, Self Care, Joint mobilization, Joint manipulation, Stair training, Orthotic/Fit training, DME instructions, Aquatic Therapy, Dry Needling, Electrical stimulation, Cryotherapy, Moist heat, Taping, Ultrasound, Ionotophoresis 4mg /ml Dexamethasone , Manual therapy,  Vasopneumatic device, Traction, Spinal manipulation, Spinal mobilization,Balance training, Gait training,   PLAN FOR NEXT SESSION: more challenged on R, R hip flex strength/ SLR, clams,  step ups (both feet), foot clearance with walking and stairs.    Tinnie Don, PT, DPT 1:02 PM  11/30/24

## 2024-12-07 ENCOUNTER — Ambulatory Visit: Admitting: Physical Therapy

## 2024-12-07 ENCOUNTER — Encounter: Payer: Self-pay | Admitting: Physical Therapy

## 2024-12-07 DIAGNOSIS — R2689 Other abnormalities of gait and mobility: Secondary | ICD-10-CM | POA: Diagnosis not present

## 2024-12-07 NOTE — Therapy (Signed)
 OUTPATIENT PHYSICAL THERAPY LOWER EXTREMITY TREATMENT   Patient Name: Curtis Jimenez MRN: [id number] DOB:10-Oct-1936, 88 y.o., male Today's Date: 12/07/2024   END OF SESSION:  PT End of Session - 12/07/24 1256     Visit Number 7    Number of Visits 16    Date for Recertification  12/21/24    Authorization Type HTA    PT Start Time 1258    PT Stop Time 1340    PT Time Calculation (min) 42 min    Equipment Utilized During Treatment Gait belt    Activity Tolerance Patient tolerated treatment well           Past Medical History:  Diagnosis Date   Basal cell carcinoma    neck (skin MD 2X per year)   Bifascicular block 2021   ectopy, asymptomatic.  Cards->observe   Bipolar disorder (HCC)    BPH (benign prostatic hyperplasia)    with hx of elevated PSA; followed by [facility] as of 2024   Chronic renal insufficiency, stage III (moderate)    in pt w/hx of renal transplant.  Post-transplant sCr 1.4-1.6.   Coronary atherosclerosis    LM and 3 V dz noted on CT 08/2019 performed for eval of interstitial lung dz in the setting of mild DOE and mild hypoxia. Cards->no stress tesing indicated->primary prevention emphasized   CVA (cerebral vascular accident) (HCC) 11/2016   Pontine (vertebrobasilar--imaging neg), TPA given.  Pt discharged on plavix .  Carotid dopplers ok, echocardiogram ok.  Residual deficit: vertigo but this improved greatly with therapy.   DOE (dyspnea on exertion) 2020   DOE and ? hypoxia->CXR 08/29/19--->diffuse interstitial opacities-? acute inflammatory process->Dr. Darlean eval'd him and was underwhelmed by the CXR and exam->CT chest 09/26/19 Very subtle areas of mild ground-glass attenuatio-nonspecific, mild air trapping. Per Dr. Darlean, no signif ILD, improving with inc ambulation (post-inflamm pulm fibrosis?)   Elevated PSA    12/2022, mild, referred to urology   Esophagitis 06/14/2020   EGD->+distal esophagitis, h pylori neg, mild distal esoph stenosis  widened with forceps, benign gastric polyps.   GERD (gastroesophageal reflux disease)    Gout    History of adenomatous polyp of colon 2018   Recall 5 yrs   Hyperlipidemia    Hypertension    Hypothyroidism    Lumbar spondylosis    Ptosis due to aging    Right   Rectal bleeding 09/2017   Admitted for obs due to Hb drop.  GI consulted---obs recommended.  No transfusion required.  Plavix  was d/c'd, ferrous sulfate  recommended.  Outpt GI f/u--plavix  restarted and upper endoscopy was normal and colonoscopy showed 2 polyps and severe diverticular dz. Iron d/c'd 04/24/19.   Renal cyst    per pt he got MRI kidneys [facility] 12/2020 and the images will be viewed by Dr. Clayborn   Restless legs syndrome    S/p cadaver renal transplant 2013   Secondary to HTN +lithium toxicity over 30 yrs caused kidney destruction [facility] transplant MDs)   Seborrheic dermatitis    eyebrows worst: Hytone  rx'd by Dr. Livingston.   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   left jawline(CX35FU), Left forehead (CX35FU) right shoulder (CX35FU), R forearm   Swallowing dysfunction    thin liquids (swallowing eval 01/2023)   Past Surgical History:  Procedure Laterality Date   AV FISTULA PLACEMENT  04/08/2012   Procedure: ARTERIOVENOUS (AV) FISTULA CREATION;  Surgeon: Lonni GORMAN Blade, MD;  Location: [facility] OR;  Service: Vascular;  Laterality: Left;  Creation of left brachial cephalic arteriovenous fistula   BIOPSY  06/14/2020   Procedure: BIOPSY;  Surgeon: San Sandor GAILS, DO;  Location: [facility];  Service: Gastroenterology;;   CATARACT EXTRACTION, BILATERAL  05/2018   CHOLECYSTECTOMY N/A 08/18/2021   Procedure: LAPAROSCOPIC CHOLECYSTECTOMY WITH CHOLANGIOGRAM;  Surgeon: Eletha Boas, MD;  Location: [facility] ORS;  Service: General;  Laterality: N/A;   COLONOSCOPY  2013; 11/24/17   2013; Normal except diverticulosis (recall 10 yrs).  2018 (for GI bleeding)--severe diverticular dz + 2 adenomatous polyps.   Recall 5 yrs.   DG CHEST AP OR PA ([facility] HX)  06/03/2020   Bibasilar atelectasis. No focal consoliadation definitively identified.   dilation for GERD     ESOPHAGOGASTRODUODENOSCOPY  11/24/2017   gastric polyps x 2, otherwise normal.   ESOPHAGOGASTRODUODENOSCOPY N/A 06/14/2020   +distal esophagitis, h pylori neg, mild distal esoph stenosis widened with forceps, benign gastric polypsProcedure: ESOPHAGOGASTRODUODENOSCOPY (EGD);  Surgeon: San Sandor GAILS, DO;  Location: [facility];  Service: Gastroenterology;  Laterality: N/A;  Diagnostic EGD as patietn last took Plavix  11am on 06/12/2020   KIDNEY TRANSPLANT  11/06/2012   [facility] (cadaver[facility])   LIGATION OF ARTERIOVENOUS  FISTULA Left 10/21/2021   Procedure: LIGATION OF LEFT ARTERIOVENOUS  FISTULA;  Surgeon: Eliza Lonni RAMAN, MD;  Location: [facility] OR;  Service: Vascular;  Laterality: Left;   PROSTATE BIOPSY  2011   no malignancy   TRANSTHORACIC ECHOCARDIOGRAM  11/2016   Normal LV systolic fxn, EF 44-39%.  Grade I DD.  Mild aortic root dilatation, mild MV regurg.   Virtual colonoscopy     07/2023 NO POLYPS   Patient Active Problem List   Diagnosis Date Noted   Chronic cholecystitis without calculus 08/18/2021   Gallbladder sludge 07/22/2021   Abdominal pain, RUQ (right upper quadrant) 07/22/2021   RUQ abdominal pain 06/04/2021   Peptic stricture of esophagus    Emesis 06/13/2020   Weight loss 06/13/2020   Cough 06/13/2020   AKI (acute kidney injury) 06/13/2020   Metabolic acidosis 06/13/2020   Esophageal dysphagia    Postinflammatory pulmonary fibrosis (HCC) 08/30/2019   DOE (dyspnea on exertion) 08/29/2019   Overweight (BMI 25.0-29.9) 07/27/2019   GIB (gastrointestinal bleeding) 10/07/2017   IFG (impaired fasting glucose) 06/08/2017   Stroke (HCC) 11/30/2016   Essential hypertension 01/16/2015   S/p cadaver renal transplant 11/06/2012   Chronic kidney disease (CKD), stage IV (severe) (HCC) 03/18/2012   BENIGN PROSTATIC  HYPERTROPHY 04/18/2008   Extrinsic asthma 12/19/2007   PSA, INCREASED 12/09/2007   Hypothyroidism 08/12/2007   Bipolar disorder (HCC) 07/27/2007   Hyperlipidemia 07/07/2007   Gout 07/07/2007   Essential hypertension 07/07/2007   GERD 07/07/2007     PCP: Aleene Hockey  REFERRING PROVIDER: Aleene Hockey  REFERRING DIAG: Unstable gait   THERAPY DIAG:  Other abnormalities of gait and mobility  Rationale for Evaluation and Treatment: Rehabilitation   ONSET DATE:     SUBJECTIVE:   SUBJECTIVE STATEMENT:  Pt with no new complaints. He did go the gym and do some weight machines over the weekend.   Eval: Pt reports he Had stroke 5/6 years ago-, states decreased balance since then. Notices decreased balance in last several months. Fell 2 x in last 2 weeks. 1 walking at park, going up incline,  and once at home when standing up and turning from chair.  AD: does not use.  Does have cane and walker but does not use.  Gets dizzy when getting up and down at church,  but states not at other times. Thinks it is when he sings.  BP - states his meds make it low. Home: Lives with wife. Steps: has 1,  2 in step to get in, with no hand rail.  Driving: Yes, he drives wife who has bad vision.  Recent chest pain- found to be non cardiac- was coughing a lot.  Exercise: 1x/wk tries to walk at some of the parks. Usually goes for 20 min.  [facility] 1.5 yr [facility].  Denies SOB or dizziness with walking    PERTINENT HISTORY: Stroke, Kidney transplant, HTN,    PAIN:  Are you having pain? No   PRECAUTIONS: Fall  WEIGHT BEARING RESTRICTIONS: No  FALLS:  Has patient fallen in last 6 months? Yes. Number of falls 2   PLOF: Independent  PATIENT GOALS:  improve balance, decrease falls.   NEXT MD VISIT:   OBJECTIVE:   DIAGNOSTIC FINDINGS:   PATIENT SURVEYS:    COGNITION: Overall cognitive status: Within functional limits for tasks  assessed     SENSATION: WFL  EDEMA:  n/a    POSTURE:    No Significant postural limitations  PALPATION:   LOWER EXTREMITY ROM: Hips: WFL, Knees: WFL   LOWER EXTREMITY MMT:  MMT Left eval Right  eval  Hip flexion 4 4  Hip extension    Hip abduction    Hip adduction    Hip internal rotation    Hip external rotation    Knee flexion 4 4  Knee extension 4+ 4+  Ankle dorsiflexion    Ankle plantarflexion    Ankle inversion    Ankle eversion     (Blank rows = not tested)  LOWER EXTREMITY SPECIAL TESTS:   Seated: eye gaze-   and head turns - no dizziness elicited States looking back and forth when standing can make him dizzy.    FUNCTIONAL TESTS:  Eval:  5 time sit to stand: 30.22 sec .      12/07/24:  16.35 TUG:  15.13- used hands to rise from chair  BERG:    GAIT: Distance walked: 150 Assistive device utilized: None Level of assistance: Complete Independence Comments: decreased balance, slow speed, likley would benefit from use of SPC   TODAY'S TREATMENT:                                                                                                                              DATE:   12/07/2024 Therapeutic Exercise: Aerobic: Supine: SLR x 10 bil;   clams Blue TB x 20;  marching/flexion Blue TB x 15;  Seated: LAQ 5lb x 2 x 10 bil (try 3 or 4s next visit )  Standing:   Stretches:  Neuromuscular Re-education: Staggered stance weight shifts x 20 bil - no UE support Toe taps (2 risers) 2 x 10 at counter ( no UE support ) cueing for increasing step height;  Slow march for balance  x 20 with support and without UE support  ambulation with practice for direction changes 45 ft x 8 cueing for not crossing foot over when changing directions ;  speed changes,  Head turns.  Gait:  Manual Therapy:  Therapeutic Activity: Sit to stand :   regular chair height 2 x 5- no hands.  Heel raises x 15 at counter;     hip abd 2 x 10  Self Care:    Therapeutic  Exercise: Aerobic: Supine: Seated: ankle pumps x 15,  sit to stand- higher mat table x 10;    Standing:  Heel raises x 15 at counter;   Stretches:  Neuromuscular Re-education: Staggered stance weight shifts x 20 bil (x10 with same side reaching) Reaching outside of bos x 10 bil;  Tandem stance(slight modified ) 20 sec x 2 bil at counter.  Standing torso turns x10 bil; Bending to pick object up from floor x 3 bil- cueing for foot and head placement.  Manual Therapy:  Therapeutic Activity: Self Care:    Therapeutic Exercise: Aerobic: Supine: Seated: ankle pumps x 15, sit to stand- higher mat table x 10;  LAQ 2 x 10 bil;  Standing:  Heel raises x 15 at counter;   Stretches:  Neuromuscular Re-education: Staggered stance weight shifts x 20 bil (x10 with same side reaching) Reaching outside of bos x 10 bil;  Tandem stance(slight modified ) 20 sec x 2 bil at counter.  Standing torso turns x10 bil; Manual Therapy: Therapeutic Activity: Self Care:    PATIENT EDUCATION:  Education details: updated and reviewed HEP Person educated: Patient Education method: Explanation, Demonstration, Tactile cues, Verbal cues, and Handouts Education comprehension: verbalized understanding, returned demonstration, verbal cues required, tactile cues required, and needs further education   HOME EXERCISE PROGRAM: Access Code: [email or web address] URL: [email or web address] Date: 11/02/2024 Prepared by: Tinnie Don  Exercises - Seated Knee Extension AROM  - 1 x daily - 2 sets - 10 reps - Seated Ankle Pumps on Table  - 1 x daily - 1-2 sets - 10 reps - Sit to Stand  - 1 x daily - 1 sets - 10 reps - Heel Raises with Counter Support  - 1 x daily - 1- 2 sets - 10 reps  ASSESSMENT:  CLINICAL IMPRESSION: Pt challenged with all balance exercises today. Cueing for improving foot clearance with most activities today. He is improving with ability for higher level activity and more dynamic  movement. Added more LE strength today, as he has noted weakness in hips.   Eval: Patient presents with primary complaint of  decreased balance. He has decreased sores on berg,and 5 time sit to stand. He will benefit from practice with head turns in standing and with walking, and work on dynamic balance, to improve safety with his outdoor walking.  Pt with decreased ability for full functional activities. Pt will  benefit from skilled PT to improve deficits and pain and to return to PLOF.   OBJECTIVE IMPAIRMENTS: Abnormal gait, decreased activity tolerance, decreased balance, decreased knowledge of use of DME, decreased mobility, decreased strength, impaired vision/preception, improper body mechanics, and pain.   ACTIVITY LIMITATIONS: bending, standing, squatting, stairs, transfers, and locomotion level  PARTICIPATION LIMITATIONS: meal prep, cleaning, shopping, and community activity  PERSONAL FACTORS: Time since onset of injury/illness/exacerbation and 1 comorbidity: stroke,  are also affecting patient's functional outcome.   REHAB POTENTIAL: Good  CLINICAL DECISION MAKING: Evolving/moderate complexity  EVALUATION COMPLEXITY: Moderate   GOALS: Goals reviewed with patient? Yes  SHORT  TERM GOALS: Target date: 11/16/2024   Pt to be independent with initial HEP  Goal status: MET  2.  Pt to demo ability to rise from higher mat table, without use of UEs.   Goal status: MET     LONG TERM GOALS: Target date: 12/21/2024   Pt to be independent with final HEP  Goal status: INITIAL  2.  Pt to improve score on 5 time sit to stand by at least 10 sec.   Goal status: INITIAL  3.  Pt to improve score on BERG by at least 5 points.   Goal status: INITIAL  4.  Pt to demo ambulation on level surfaces to be [facility] , with dynamic movement, head turns and direction changes .  Goal status: INITIAL     PLAN:  PT FREQUENCY: 1-2x/week  PT DURATION: 8 weeks  PLANNED INTERVENTIONS:  Therapeutic exercises, Therapeutic activity, Neuromuscular re-education, Patient/Family education, Self Care, Joint mobilization, Joint manipulation, Stair training, Orthotic/Fit training, DME instructions, Aquatic Therapy, Dry Needling, Electrical stimulation, Cryotherapy, Moist heat, Taping, Ultrasound, Ionotophoresis 4mg /ml Dexamethasone , Manual therapy,  Vasopneumatic device, Traction, Spinal manipulation, Spinal mobilization,Balance training, Gait training,   PLAN FOR NEXT SESSION: more challenged on R,   R hip flex strength/ SLR,  clams,  step ups (both feet), foot clearance with walking and stairs.    Tinnie Don, PT, DPT 12:56 PM  12/07/2024

## 2024-12-14 ENCOUNTER — Ambulatory Visit: Admitting: Physical Therapy

## 2024-12-14 ENCOUNTER — Encounter: Payer: Self-pay | Admitting: Physical Therapy

## 2024-12-14 DIAGNOSIS — R2689 Other abnormalities of gait and mobility: Secondary | ICD-10-CM

## 2024-12-14 NOTE — Therapy (Signed)
 OUTPATIENT PHYSICAL THERAPY LOWER EXTREMITY TREATMENT   Patient Name: Curtis Jimenez MRN: [id number] DOB:June 26, 1936, 88 y.o., male Today's Date: 12/14/2024   END OF SESSION:  PT End of Session - 12/14/24 1318     Visit Number 8    Number of Visits 16    Date for Recertification  12/21/24    Authorization Type HTA    PT Start Time 1305    PT Stop Time 1344    PT Time Calculation (min) 39 min    Equipment Utilized During Treatment Gait belt    Activity Tolerance Patient tolerated treatment well    Behavior During Therapy WFL for tasks assessed/performed            Past Medical History:  Diagnosis Date   Basal cell carcinoma    neck (skin MD 2X per year)   Bifascicular block 2021   ectopy, asymptomatic.  Cards->observe   Bipolar disorder (HCC)    BPH (benign prostatic hyperplasia)    with hx of elevated PSA; followed by [facility] as of 2024   Chronic renal insufficiency, stage III (moderate)    in pt w/hx of renal transplant.  Post-transplant sCr 1.4-1.6.   Coronary atherosclerosis    LM and 3 V dz noted on CT 08/2019 performed for eval of interstitial lung dz in the setting of mild DOE and mild hypoxia. Cards->no stress tesing indicated->primary prevention emphasized   CVA (cerebral vascular accident) (HCC) 11/2016   Pontine (vertebrobasilar--imaging neg), TPA given.  Pt discharged on plavix .  Carotid dopplers ok, echocardiogram ok.  Residual deficit: vertigo but this improved greatly with therapy.   DOE (dyspnea on exertion) 2020   DOE and ? hypoxia->CXR 08/29/19--->diffuse interstitial opacities-? acute inflammatory process->Dr. Darlean eval'd him and was underwhelmed by the CXR and exam->CT chest 09/26/19 Very subtle areas of mild ground-glass attenuatio-nonspecific, mild air trapping. Per Dr. Darlean, no signif ILD, improving with inc ambulation (post-inflamm pulm fibrosis?)   Elevated PSA    12/2022, mild, referred to urology   Esophagitis 06/14/2020    EGD->+distal esophagitis, h pylori neg, mild distal esoph stenosis widened with forceps, benign gastric polyps.   GERD (gastroesophageal reflux disease)    Gout    History of adenomatous polyp of colon 2018   Recall 5 yrs   Hyperlipidemia    Hypertension    Hypothyroidism    Lumbar spondylosis    Ptosis due to aging    Right   Rectal bleeding 09/2017   Admitted for obs due to Hb drop.  GI consulted---obs recommended.  No transfusion required.  Plavix  was d/c'd, ferrous sulfate  recommended.  Outpt GI f/u--plavix  restarted and upper endoscopy was normal and colonoscopy showed 2 polyps and severe diverticular dz. Iron d/c'd 04/24/19.   Renal cyst    per pt he got MRI kidneys at the [facility] 12/2020 and the images will be viewed by Dr. Clayborn   Restless legs syndrome    S/p cadaver renal transplant 2013   Secondary to HTN +lithium toxicity over 30 yrs caused kidney destruction [facility] transplant MDs)   Seborrheic dermatitis    eyebrows worst: Hytone  rx'd by Dr. Livingston.   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   left jawline(CX35FU), Left forehead (CX35FU) right shoulder (CX35FU), R forearm   Swallowing dysfunction    thin liquids (swallowing eval 01/2023)   Past Surgical History:  Procedure Laterality Date   AV FISTULA PLACEMENT  04/08/2012   Procedure: ARTERIOVENOUS (AV) FISTULA CREATION;  Surgeon:  Lonni GORMAN Blade, MD;  Location: [facility] OR;  Service: Vascular;  Laterality: Left;  Creation of left brachial cephalic arteriovenous fistula   BIOPSY  06/14/2020   Procedure: BIOPSY;  Surgeon: San Sandor GAILS, DO;  Location: [facility] ENDOSCOPY;  Service: Gastroenterology;;   CATARACT EXTRACTION, BILATERAL  05/2018   CHOLECYSTECTOMY N/A 08/18/2021   Procedure: LAPAROSCOPIC CHOLECYSTECTOMY WITH CHOLANGIOGRAM;  Surgeon: Eletha Boas, MD;  Location: [facility] ORS;  Service: General;  Laterality: N/A;   COLONOSCOPY  2013; 11/24/17   2013; Normal except diverticulosis (recall 10 yrs).  2018  (for GI bleeding)--severe diverticular dz + 2 adenomatous polyps.  Recall 5 yrs.   DG CHEST AP OR PA ([facility] HX)  06/03/2020   Bibasilar atelectasis. No focal consoliadation definitively identified.   dilation for GERD     ESOPHAGOGASTRODUODENOSCOPY  11/24/2017   gastric polyps x 2, otherwise normal.   ESOPHAGOGASTRODUODENOSCOPY N/A 06/14/2020   +distal esophagitis, h pylori neg, mild distal esoph stenosis widened with forceps, benign gastric polypsProcedure: ESOPHAGOGASTRODUODENOSCOPY (EGD);  Surgeon: San Sandor GAILS, DO;  Location: [facility];  Service: Gastroenterology;  Laterality: N/A;  Diagnostic EGD as patietn last took Plavix  11am on 06/12/2020   KIDNEY TRANSPLANT  11/06/2012   [facility] (cadaveric)   LIGATION OF ARTERIOVENOUS  FISTULA Left 10/21/2021   Procedure: LIGATION OF LEFT ARTERIOVENOUS  FISTULA;  Surgeon: Blade Lonni GORMAN, MD;  Location: [facility] OR;  Service: Vascular;  Laterality: Left;   PROSTATE BIOPSY  2011   no malignancy   TRANSTHORACIC ECHOCARDIOGRAM  11/2016   Normal LV systolic fxn, EF 44-39%.  Grade I DD.  Mild aortic root dilatation, mild MV regurg.   Virtual colonoscopy     07/2023 NO POLYPS   Patient Active Problem List   Diagnosis Date Noted   Chronic cholecystitis without calculus 08/18/2021   Gallbladder sludge 07/22/2021   Abdominal pain, RUQ (right upper quadrant) 07/22/2021   RUQ abdominal pain 06/04/2021   Peptic stricture of esophagus    Emesis 06/13/2020   Weight loss 06/13/2020   Cough 06/13/2020   AKI (acute kidney injury) 06/13/2020   Metabolic acidosis 06/13/2020   Esophageal dysphagia    Postinflammatory pulmonary fibrosis (HCC) 08/30/2019   DOE (dyspnea on exertion) 08/29/2019   Overweight (BMI 25.0-29.9) 07/27/2019   GIB (gastrointestinal bleeding) 10/07/2017   IFG (impaired fasting glucose) 06/08/2017   Stroke (HCC) 11/30/2016   Essential hypertension 01/16/2015   S/p cadaver renal transplant 11/06/2012   Chronic kidney disease  (CKD), stage IV (severe) (HCC) 03/18/2012   BENIGN PROSTATIC HYPERTROPHY 04/18/2008   Extrinsic asthma 12/19/2007   PSA, INCREASED 12/09/2007   Hypothyroidism 08/12/2007   Bipolar disorder (HCC) 07/27/2007   Hyperlipidemia 07/07/2007   Gout 07/07/2007   Essential hypertension 07/07/2007   GERD 07/07/2007     PCP: Aleene Hockey  REFERRING PROVIDER: Aleene Hockey  REFERRING DIAG: Unstable gait   THERAPY DIAG:  Other abnormalities of gait and mobility  Rationale for Evaluation and Treatment: Rehabilitation   ONSET DATE:    SUBJECTIVE:   SUBJECTIVE STATEMENT: Pt with no new complaints. Feels like his strength/stamina is limited. States he can walk .25 of a mile, and then feels like his legs are going to give out. Has still been walking at the park.    Eval: Pt reports he Had stroke 5/6 years ago-, states decreased balance since then. Notices decreased balance in last several months. Fell 2 x in last 2 weeks. 1 walking at park, going up incline,  and once at home when standing  up and turning from chair.  AD: does not use.  Does have cane and walker but does not use.  Gets dizzy when getting up and down at church, but states not at other times. Thinks it is when he sings.  BP - states his meds make it low. Home: Lives with wife. Steps: has 1,  2 in step to get in, with no hand rail.  Driving: Yes, he drives wife who has bad vision.  Recent chest pain- found to be non cardiac- was coughing a lot.  Exercise: 1x/wk tries to walk at some of the parks. Usually goes for 20 min.  [facility] 1[facility].  Denies SOB or dizziness with walking    PERTINENT HISTORY: Stroke, Kidney transplant, HTN,    PAIN:  Are you having pain? No   PRECAUTIONS: Fall  WEIGHT BEARING RESTRICTIONS: No  FALLS:  Has patient fallen in last 6 months? Yes. Number of falls 2   PLOF: Independent  PATIENT GOALS:  improve balance, decrease falls.   NEXT MD VISIT:    OBJECTIVE:   DIAGNOSTIC FINDINGS:   PATIENT SURVEYS:    COGNITION: Overall cognitive status: Within functional limits for tasks assessed     SENSATION: WFL  EDEMA:  n/a    POSTURE:    No Significant postural limitations  PALPATION:   LOWER EXTREMITY ROM: Hips: WFL, Knees: WFL   LOWER EXTREMITY MMT:  MMT Left eval Right  eval  Hip flexion 4 4  Hip extension    Hip abduction    Hip adduction    Hip internal rotation    Hip external rotation    Knee flexion 4 4  Knee extension 4+ 4+  Ankle dorsiflexion    Ankle plantarflexion    Ankle inversion    Ankle eversion     (Blank rows = not tested)   LOWER EXTREMITY SPECIAL TESTS:   Seated: eye gaze-   and head turns - no dizziness elicited States looking back and forth when standing can make him dizzy.    FUNCTIONAL TESTS:  Eval:  5 time sit to stand: 30.22 sec .                 12/07/24:  16.35 TUG:  15.13- used hands to rise from chair       12/14/24   TUG:  10.72 sec no UE support to rise   BERG:    GAIT: Distance walked: 150 Assistive device utilized: None Level of assistance: Complete Independence Comments: decreased balance, slow speed, likley would benefit from use of SPC   TODAY'S TREATMENT:                                                                                                                              DATE:   12/14/2024 Therapeutic Exercise: Aerobic: Supine:  SLR 2  x 10 bil;   clams Blue TB x 20;  bridging 2 x 10  Seated: LAQ 4 lb ,  2 x 10 bil  Standing:   Stretches:  Neuromuscular Re-education: Manual Therapy:  Therapeutic Activity: Sit to stand :   regular chair height  2 x 5 no hands  Heel raises x 15 at counter;     hip abd 2 x 10 ,  Self Care:    Therapeutic Exercise: Aerobic: Supine: SLR x 10 bil;   clams Blue TB x 20;  marching/flexion Blue TB x 15;  Seated: LAQ 5lb x 2 x 10 bil (try 3 or 4s next visit )  Standing:   Stretches:  Neuromuscular  Re-education: Staggered stance weight shifts x 20 bil - no UE support Toe taps (2 risers) 2 x 10 at counter ( no UE support ) cueing for increasing step height;  Slow march for balance x 20 with support and without UE support  ambulation with practice for direction changes 45 ft x 8 cueing for not crossing foot over when changing directions ;  speed changes,  Head turns.  Gait:  Manual Therapy:  Therapeutic Activity: Sit to stand :   regular chair height 2 x 5- no hands.  Heel raises x 15 at counter;     hip abd 2 x 10  Self Care:    Therapeutic Exercise: Aerobic: Supine: Seated: ankle pumps x 15,  sit to stand- higher mat table x 10;    Standing:  Heel raises x 15 at counter;   Stretches:  Neuromuscular Re-education: Staggered stance weight shifts x 20 bil (x10 with same side reaching) Reaching outside of bos x 10 bil;  Tandem stance(slight modified ) 20 sec x 2 bil at counter.  Standing torso turns x10 bil; Bending to pick object up from floor x 3 bil- cueing for foot and head placement.  Manual Therapy:  Therapeutic Activity: Self Care:    Therapeutic Exercise: Aerobic: Supine: Seated: ankle pumps x 15, sit to stand- higher mat table x 10;  LAQ 2 x 10 bil;  Standing:  Heel raises x 15 at counter;   Stretches:  Neuromuscular Re-education: Staggered stance weight shifts x 20 bil (x10 with same side reaching) Reaching outside of bos x 10 bil;  Tandem stance(slight modified ) 20 sec x 2 bil at counter.  Standing torso turns x10 bil; Manual Therapy: Therapeutic Activity: Self Care:    PATIENT EDUCATION:  Education details: updated and reviewed HEP Person educated: Patient Education method: Explanation, Demonstration, Tactile cues, Verbal cues, and Handouts Education comprehension: verbalized understanding, returned demonstration, verbal cues required, tactile cues required, and needs further education   HOME EXERCISE PROGRAM: Access Code: [email or web address] URL:  [email or web address] Date: 11/02/2024 Prepared by: Tinnie Don  Exercises - Seated Knee Extension AROM  - 1 x daily - 2 sets - 10 reps - Seated Ankle Pumps on Table  - 1 x daily - 1-2 sets - 10 reps - Sit to Stand  - 1 x daily - 1 sets - 10 reps - Heel Raises with Counter Support  - 1 x daily - 1- 2 sets - 10 reps  ASSESSMENT:  CLINICAL IMPRESSION: Focus this session on continued LE strength and education for HEP. He requires Cueing to slow speed of exercises with most all activities today. Will continue to work on [employer] and also progress dynamic balance. Re-test of TUG today with much improved score from Eval.   Pt challenged with all balance exercises today. Cueing for improving foot clearance with most activities today.  He is improving with ability for higher level activity and more dynamic movement. Added more LE strength today, as he has noted weakness in hips.   Eval: Patient presents with primary complaint of  decreased balance. He has decreased sores on berg,and 5 time sit to stand. He will benefit from practice with head turns in standing and with walking, and work on dynamic balance, to improve safety with his outdoor walking.  Pt with decreased ability for full functional activities. Pt will  benefit from skilled PT to improve deficits and pain and to return to PLOF.   OBJECTIVE IMPAIRMENTS: Abnormal gait, decreased activity tolerance, decreased balance, decreased knowledge of use of DME, decreased mobility, decreased strength, impaired vision/preception, improper body mechanics, and pain.   ACTIVITY LIMITATIONS: bending, standing, squatting, stairs, transfers, and locomotion level  PARTICIPATION LIMITATIONS: meal prep, cleaning, shopping, and community activity  PERSONAL FACTORS: Time since onset of injury/illness/exacerbation and 1 comorbidity: stroke,  are also affecting patient's functional outcome.   REHAB POTENTIAL: Good  CLINICAL DECISION MAKING:  Evolving/moderate complexity  EVALUATION COMPLEXITY: Moderate   GOALS: Goals reviewed with patient? Yes  SHORT TERM GOALS: Target date: 11/16/2024   Pt to be independent with initial HEP  Goal status: MET  2.  Pt to demo ability to rise from higher mat table, without use of UEs.   Goal status: MET    LONG TERM GOALS: Target date: 12/21/2024   Pt to be independent with final HEP  Goal status: INITIAL  2.  Pt to improve score on 5 time sit to stand by at least 10 sec.   Goal status: INITIAL  3.  Pt to improve score on BERG by at least 5 points.   Goal status: INITIAL  4.  Pt to demo ambulation on level surfaces to be [facility] , with dynamic movement, head turns and direction changes .  Goal status: INITIAL     PLAN:  PT FREQUENCY: 1-2x/week  PT DURATION: 8 weeks  PLANNED INTERVENTIONS: Therapeutic exercises, Therapeutic activity, Neuromuscular re-education, Patient/Family education, Self Care, Joint mobilization, Joint manipulation, Stair training, Orthotic/Fit training, DME instructions, Aquatic Therapy, Dry Needling, Electrical stimulation, Cryotherapy, Moist heat, Taping, Ultrasound, Ionotophoresis 4mg /ml Dexamethasone , Manual therapy,  Vasopneumatic device, Traction, Spinal manipulation, Spinal mobilization,Balance training, Gait training,   PLAN FOR NEXT SESSION: more challenged on R,   R hip flex strength/ SLR,  clams,  step ups (both feet), foot clearance with walking and stairs.  Step ups, LE strength, dynamic balance.    Tinnie Don, PT, DPT 1:44 PM  12/14/2024

## 2024-12-19 ENCOUNTER — Encounter: Admitting: Physical Therapy

## 2024-12-19 ENCOUNTER — Encounter: Payer: Self-pay | Admitting: Physical Therapy

## 2024-12-19 ENCOUNTER — Ambulatory Visit: Admitting: Physical Therapy

## 2024-12-19 DIAGNOSIS — R2689 Other abnormalities of gait and mobility: Secondary | ICD-10-CM | POA: Diagnosis not present

## 2024-12-19 NOTE — Therapy (Signed)
 " OUTPATIENT PHYSICAL THERAPY LOWER EXTREMITY TREATMENT   Patient Name: Curtis Jimenez MRN: [id number] DOB:September 01, 1936, 88 y.o., male Today's Date: 12/19/2024   END OF SESSION:  PT End of Session - 12/19/24 1300     Visit Number 9    Number of Visits 16    Date for Recertification  12/21/24    Authorization Type HTA    PT Start Time 1301    PT Stop Time 1340    PT Time Calculation (min) 39 min    Equipment Utilized During Treatment Gait belt    Activity Tolerance Patient tolerated treatment well    Behavior During Therapy WFL for tasks assessed/performed            Past Medical History:  Diagnosis Date   Basal cell carcinoma    neck (skin MD 2X per year)   Bifascicular block 2021   ectopy, asymptomatic.  Cards->observe   Bipolar disorder (HCC)    BPH (benign prostatic hyperplasia)    with hx of elevated PSA; followed by [facility] as of 2024   Chronic renal insufficiency, stage III (moderate)    in pt w/hx of renal transplant.  Post-transplant sCr 1.4-1.6.   Coronary atherosclerosis    LM and 3 V dz noted on CT 08/2019 performed for eval of interstitial lung dz in the setting of mild DOE and mild hypoxia. Cards->no stress tesing indicated->primary prevention emphasized   CVA (cerebral vascular accident) (HCC) 11/2016   Pontine (vertebrobasilar--imaging neg), TPA given.  Pt discharged on plavix .  Carotid dopplers ok, echocardiogram ok.  Residual deficit: vertigo but this improved greatly with therapy.   DOE (dyspnea on exertion) 2020   DOE and ? hypoxia->CXR 08/29/19--->diffuse interstitial opacities-? acute inflammatory process->Dr. Darlean eval'd him and was underwhelmed by the CXR and exam->CT chest 09/26/19 Very subtle areas of mild ground-glass attenuatio-nonspecific, mild air trapping. Per Dr. Darlean, no signif ILD, improving with inc ambulation (post-inflamm pulm fibrosis?)   Elevated PSA    12/2022, mild, referred to urology   Esophagitis 06/14/2020    EGD->+distal esophagitis, h pylori neg, mild distal esoph stenosis widened with forceps, benign gastric polyps.   GERD (gastroesophageal reflux disease)    Gout    History of adenomatous polyp of colon 2018   Recall 5 yrs   Hyperlipidemia    Hypertension    Hypothyroidism    Lumbar spondylosis    Ptosis due to aging    Right   Rectal bleeding 09/2017   Admitted for obs due to Hb drop.  GI consulted---obs recommended.  No transfusion required.  Plavix  was d/c'd, ferrous sulfate  recommended.  Outpt GI f/u--plavix  restarted and upper endoscopy was normal and colonoscopy showed 2 polyps and severe diverticular dz. Iron d/c'd 04/24/19.   Renal cyst    per pt he got MRI kidneys at the [facility] 12/2020 and the images will be viewed by Dr. Clayborn   Restless legs syndrome    S/p cadaver renal transplant 2013   Secondary to HTN +lithium toxicity over 30 yrs caused kidney destruction [facility] transplant MDs)   Seborrheic dermatitis    eyebrows worst: Hytone  rx'd by Dr. Livingston.   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   left jawline(CX35FU), Left forehead (CX35FU) right shoulder (CX35FU), R forearm   Swallowing dysfunction    thin liquids (swallowing eval 01/2023)   Past Surgical History:  Procedure Laterality Date   AV FISTULA PLACEMENT  04/08/2012   Procedure: ARTERIOVENOUS (AV) FISTULA CREATION;  Surgeon: Lonni GORMAN Blade, MD;  Location: [facility] OR;  Service: Vascular;  Laterality: Left;  Creation of left brachial cephalic arteriovenous fistula   BIOPSY  06/14/2020   Procedure: BIOPSY;  Surgeon: San Sandor GAILS, DO;  Location: [facility]COPY;  Service: Gastroenterology;;   CATARACT EXTRACTION, BILATERAL  05/2018   CHOLECYSTECTOMY N/A 08/18/2021   Procedure: LAPAROSCOPIC CHOLECYSTECTOMY WITH CHOLANGIOGRAM;  Surgeon: Eletha Boas, MD;  Location: [facility] ORS;  Service: General;  Laterality: N/A;   COLONOSCOPY  2013; 11/24/17   2013; Normal except diverticulosis (recall 10 yrs).  2018  (for GI bleeding)--severe diverticular dz + 2 adenomatous polyps.  Recall 5 yrs.   DG CHEST AP OR PA ([facility] HX)  06/03/2020   Bibasilar atelectasis. No focal consoliadation definitively identified.   dilation for GERD     ESOPHAGOGASTRODUODENOSCOPY  11/24/2017   gastric polyps x 2, otherwise normal.   ESOPHAGOGASTRODUODENOSCOPY N/A 06/14/2020   +distal esophagitis, h pylori neg, mild distal esoph stenosis widened with forceps, benign gastric polypsProcedure: ESOPHAGOGASTRODUODENOSCOPY (EGD);  Surgeon: San Sandor GAILS, DO;  Location: [facility];  Service: Gastroenterology;  Laterality: N/A;  Diagnostic EGD as patietn last took Plavix  11am on 06/12/2020   KIDNEY TRANSPLANT  11/06/2012   [facility] (cadaver[facility])   LIGATION OF ARTERIOVENOUS  FISTULA Left 10/21/2021   Procedure: LIGATION OF LEFT ARTERIOVENOUS  FISTULA;  Surgeon: Blade Lonni GORMAN, MD;  Location: [facility] OR;  Service: Vascular;  Laterality: Left;   PROSTATE BIOPSY  2011   no malignancy   TRANSTHORACIC ECHOCARDIOGRAM  11/2016   Normal LV systolic fxn, EF 44-39%.  Grade I DD.  Mild aortic root dilatation, mild MV regurg.   Virtual colonoscopy     07/2023 NO POLYPS   Patient Active Problem List   Diagnosis Date Noted   Chronic cholecystitis without calculus 08/18/2021   Gallbladder sludge 07/22/2021   Abdominal pain, RUQ (right upper quadrant) 07/22/2021   RUQ abdominal pain 06/04/2021   Peptic stricture of esophagus    Emesis 06/13/2020   Weight loss 06/13/2020   Cough 06/13/2020   AKI (acute kidney injury) 06/13/2020   Metabolic acidosis 06/13/2020   Esophageal dysphagia    Postinflammatory pulmonary fibrosis (HCC) 08/30/2019   DOE (dyspnea on exertion) 08/29/2019   Overweight (BMI 25.0-29.9) 07/27/2019   GIB (gastrointestinal bleeding) 10/07/2017   IFG (impaired fasting glucose) 06/08/2017   Stroke (HCC) 11/30/2016   Essential hypertension 01/16/2015   S/p cadaver renal transplant 11/06/2012   Chronic kidney disease  (CKD), stage IV (severe) (HCC) 03/18/2012   BENIGN PROSTATIC HYPERTROPHY 04/18/2008   Extrinsic asthma 12/19/2007   PSA, INCREASED 12/09/2007   Hypothyroidism 08/12/2007   Bipolar disorder (HCC) 07/27/2007   Hyperlipidemia 07/07/2007   Gout 07/07/2007   Essential hypertension 07/07/2007   GERD 07/07/2007     PCP: Aleene Hockey  REFERRING PROVIDER: Aleene Hockey  REFERRING DIAG: Unstable gait   THERAPY DIAG:  Other abnormalities of gait and mobility  Rationale for Evaluation and Treatment: Rehabilitation   ONSET DATE:    SUBJECTIVE:   SUBJECTIVE STATEMENT: Pt with no new complaints.   Eval: Pt reports he Had stroke 5/6 years ago-, states decreased balance since then. Notices decreased balance in last several months. Fell 2 x in last 2 weeks. 1 walking at park, going up incline,  and once at home when standing up and turning from chair.  AD: does not use.  Does have cane and walker but does not use.  Gets dizzy when getting up and down at church, but states  not at other times. Thinks it is when he sings.  BP - states his meds make it low. Home: Lives with wife. Steps: has 1,  2 in step to get in, with no hand rail.  Driving: Yes, he drives wife who has bad vision.  Recent chest pain- found to be non cardiac- was coughing a lot.  Exercise: 1x/wk tries to walk at some of the parks. Usually goes for 20 min.  [facility] 1[facility]5 [facility].  Denies SOB or dizziness with walking    PERTINENT HISTORY: Stroke, Kidney transplant, HTN,    PAIN:  Are you having pain? No   PRECAUTIONS: Fall  WEIGHT BEARING RESTRICTIONS: No  FALLS:  Has patient fallen in last 6 months? Yes. Number of falls 2   PLOF: Independent  PATIENT GOALS:  improve balance, decrease falls.   NEXT MD VISIT:   OBJECTIVE:   DIAGNOSTIC FINDINGS:   PATIENT SURVEYS:    COGNITION: Overall cognitive status: Within functional limits for tasks  assessed     SENSATION: WFL  EDEMA:  n/a    POSTURE:    No Significant postural limitations  PALPATION:   LOWER EXTREMITY ROM: Hips: WFL, Knees: WFL   LOWER EXTREMITY MMT:  MMT Left eval Right  eval  Hip flexion 4 4  Hip extension    Hip abduction    Hip adduction    Hip internal rotation    Hip external rotation    Knee flexion 4 4  Knee extension 4+ 4+  Ankle dorsiflexion    Ankle plantarflexion    Ankle inversion    Ankle eversion     (Blank rows = not tested)   LOWER EXTREMITY SPECIAL TESTS:   Seated: eye gaze-   and head turns - no dizziness elicited States looking back and forth when standing can make him dizzy.    FUNCTIONAL TESTS:  Eval:  5 time sit to stand: 30.22 sec .                 12/07/24:  16.35 TUG:  15.13- used hands to rise from chair       12/14/24   TUG:  10.72 sec no UE support to rise   BERG:    GAIT: Distance walked: 150 Assistive device utilized: None Level of assistance: Complete Independence Comments: decreased balance, slow speed, likley would benefit from use of SPC   TODAY'S TREATMENT:                                                                                                                              DATE:   12/19/2024 Therapeutic Exercise: Aerobic: Supine:  SLR 2  x 10 bil;   clams Blue TB x 20;   bridging 2 x 10;  Seated: LAQ 4 lb ,  2 x 10 bil ;  HSC GTB x 15 bil;   Standing:   Stretches:  Neuromuscular Re-education:  Marching x 20/slow  Toe taps x 20 , 6 in step  Lateral and fwd stepping with weight shifts/large steps x 15 ea bil;   Manual Therapy:  Therapeutic Activity:  Sit to stand :   regular chair height   x 10  (7lb)   hip abd 2 x 10 ,  Self Care:    Therapeutic Exercise: Aerobic: Supine:  SLR 2  x 10 bil;   clams Blue TB x 20;   bridging 2 x 10  Seated: LAQ 4 lb ,  2 x 10 bil  Standing:   Stretches:  Neuromuscular Re-education: Manual Therapy:  Therapeutic Activity: Sit to stand :    regular chair height  2 x 5 no hands  Heel raises x 15 at counter;     hip abd 2 x 10 ,  Self Care:    PATIENT EDUCATION:  Education details: updated and reviewed HEP Person educated: Patient Education method: Explanation, Demonstration, Tactile cues, Verbal cues, and Handouts Education comprehension: verbalized understanding, returned demonstration, verbal cues required, tactile cues required, and needs further education   HOME EXERCISE PROGRAM: Access Code: [email or web address]   ASSESSMENT:  CLINICAL IMPRESSION: Continued foucs on LE strength and education for HEP. He requires Cueing to slow speed of exercises with most all activities today. LEs fatigue with standing activities, and he requires rest or seated rest.   Pt challenged with all balance exercises today. Cueing for improving foot clearance with most activities today. He is improving with ability for higher level activity and more dynamic movement. Added more LE strength today, as he has noted weakness in hips.   Eval: Patient presents with primary complaint of  decreased balance. He has decreased sores on berg,and 5 time sit to stand. He will benefit from practice with head turns in standing and with walking, and work on dynamic balance, to improve safety with his outdoor walking.  Pt with decreased ability for full functional activities. Pt will  benefit from skilled PT to improve deficits and pain and to return to PLOF.   OBJECTIVE IMPAIRMENTS: Abnormal gait, decreased activity tolerance, decreased balance, decreased knowledge of use of DME, decreased mobility, decreased strength, impaired vision/preception, improper body mechanics, and pain.   ACTIVITY LIMITATIONS: bending, standing, squatting, stairs, transfers, and locomotion level  PARTICIPATION LIMITATIONS: meal prep, cleaning, shopping, and community activity  PERSONAL FACTORS: Time since onset of injury/illness/exacerbation and 1 comorbidity: stroke,  are also affecting  patient's functional outcome.   REHAB POTENTIAL: Good  CLINICAL DECISION MAKING: Evolving/moderate complexity  EVALUATION COMPLEXITY: Moderate   GOALS: Goals reviewed with patient? Yes  SHORT TERM GOALS: Target date: 11/16/2024   Pt to be independent with initial HEP  Goal status: MET  2.  Pt to demo ability to rise from higher mat table, without use of UEs.   Goal status: MET    LONG TERM GOALS: Target date: 12/21/2024   Pt to be independent with final HEP  Goal status: INITIAL  2.  Pt to improve score on 5 time sit to stand by at least 10 sec.   Goal status: INITIAL  3.  Pt to improve score on BERG by at least 5 points.   Goal status: INITIAL  4.  Pt to demo ambulation on level surfaces to be [facility] , with dynamic movement, head turns and direction changes .  Goal status: INITIAL     PLAN:  PT FREQUENCY: 1-2x/week  PT DURATION: 8 weeks  PLANNED INTERVENTIONS: Therapeutic exercises, Therapeutic activity, Neuromuscular  re-education, Patient/Family education, Self Care, Joint mobilization, Joint manipulation, Stair training, Orthotic/Fit training, DME instructions, Aquatic Therapy, Dry Needling, Electrical stimulation, Cryotherapy, Moist heat, Taping, Ultrasound, Ionotophoresis 4mg /ml Dexamethasone , Manual therapy,  Vasopneumatic device, Traction, Spinal manipulation, Spinal mobilization,Balance training, Gait training,   PLAN FOR NEXT SESSION: more challenged on R,   R hip flex strength/ SLR,  clams,  step ups (both feet), foot clearance with walking and stairs.  Step ups, LE strength, dynamic balance. Stairs,  Re-Cert next visit    Tinnie Don, PT, DPT 1:01 PM  12/19/2024   "

## 2024-12-25 ENCOUNTER — Encounter: Payer: Self-pay | Admitting: Physical Therapy

## 2024-12-25 ENCOUNTER — Ambulatory Visit: Admitting: Physical Therapy

## 2024-12-25 DIAGNOSIS — R2689 Other abnormalities of gait and mobility: Secondary | ICD-10-CM

## 2024-12-25 NOTE — Therapy (Signed)
 " OUTPATIENT PHYSICAL THERAPY LOWER EXTREMITY TREATMENT /PN/ Re-Cert    Patient Name: Curtis Jimenez MRN: [id number] DOB:06-May-1936, 88 y.o., male Today's Date: 12/25/2024   Physical Therapy Progress Note  Dates of Reporting Period: 10/26/24   to   12/25/24     END OF SESSION:  PT End of Session - 12/25/24 1220     Visit Number 10    Number of Visits 16    Date for Recertification  02/05/25    Authorization Type HTA    PT Start Time 1217    PT Stop Time 1259    PT Time Calculation (min) 42 min    Equipment Utilized During Treatment Gait belt    Activity Tolerance Patient tolerated treatment well    Behavior During Therapy WFL for tasks assessed/performed             Past Medical History:  Diagnosis Date   Basal cell carcinoma    neck (skin MD 2X per year)   Bifascicular block 2021   ectopy, asymptomatic.  Cards->observe   Bipolar disorder (HCC)    BPH (benign prostatic hyperplasia)    with hx of elevated PSA; followed by [facility] as of 2024   Chronic renal insufficiency, stage III (moderate)    in pt w/hx of renal transplant.  Post-transplant sCr 1.4-1.6.   Coronary atherosclerosis    LM and 3 V dz noted on CT 08/2019 performed for eval of interstitial lung dz in the setting of mild DOE and mild hypoxia. Cards->no stress tesing indicated->primary prevention emphasized   CVA (cerebral vascular accident) (HCC) 11/2016   Pontine (vertebrobasilar--imaging neg), TPA given.  Pt discharged on plavix .  Carotid dopplers ok, echocardiogram ok.  Residual deficit: vertigo but this improved greatly with therapy.   DOE (dyspnea on exertion) 2020   DOE and ? hypoxia->CXR 08/29/19--->diffuse interstitial opacities-? acute inflammatory process->Dr. Darlean eval'd him and was underwhelmed by the CXR and exam->CT chest 09/26/19 Very subtle areas of mild ground-glass attenuatio-nonspecific, mild air trapping. Per Dr. Darlean, no signif ILD, improving with inc ambulation  (post-inflamm pulm fibrosis?)   Elevated PSA    12/2022, mild, referred to urology   Esophagitis 06/14/2020   EGD->+distal esophagitis, h pylori neg, mild distal esoph stenosis widened with forceps, benign gastric polyps.   GERD (gastroesophageal reflux disease)    Gout    History of adenomatous polyp of colon 2018   Recall 5 yrs   Hyperlipidemia    Hypertension    Hypothyroidism    Lumbar spondylosis    Ptosis due to aging    Right   Rectal bleeding 09/2017   Admitted for obs due to Hb drop.  GI consulted---obs recommended.  No transfusion required.  Plavix  was d/c'd, ferrous sulfate  recommended.  Outpt GI f/u--plavix  restarted and upper endoscopy was normal and colonoscopy showed 2 polyps and severe diverticular dz. Iron d/c'd 04/24/19.   Renal cyst    per pt he got MRI kidneys [facility] the [facility] 12/2020 and the images will be viewed by Dr. Clayborn   Restless legs syndrome    S/p cadaver renal transplant 2013   Secondary to HTN +lithium toxicity over 30 yrs caused kidney destruction [facility] transplant MDs)   Seborrheic dermatitis    eyebrows worst: Hytone  rx'd by Dr. Livingston.   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   left jawline(CX35FU), Left forehead (CX35FU) right shoulder (CX35FU), R forearm   Swallowing dysfunction    thin liquids (swallowing eval 01/2023)  Past Surgical History:  Procedure Laterality Date   AV FISTULA PLACEMENT  04/08/2012   Procedure: ARTERIOVENOUS (AV) FISTULA CREATION;  Surgeon: Lonni GORMAN Blade, MD;  Location: [facility] OR;  Service: Vascular;  Laterality: Left;  Creation of left brachial cephalic arteriovenous fistula   BIOPSY  06/14/2020   Procedure: BIOPSY;  Surgeon: San Sandor GAILS, DO;  Location: [facility];  Service: Gastroenterology;;   CATARACT EXTRACTION, BILATERAL  05/2018   CHOLECYSTECTOMY N/A 08/18/2021   Procedure: LAPAROSCOPIC CHOLECYSTECTOMY WITH CHOLANGIOGRAM;  Surgeon: Eletha Boas, MD;  Location: [facility] ORS;  Service:  General;  Laterality: N/A;   COLONOSCOPY  2013; 11/24/17   2013; Normal except diverticulosis (recall 10 yrs).  2018 (for GI bleeding)--severe diverticular dz + 2 adenomatous polyps.  Recall 5 yrs.   DG CHEST AP OR PA ([facility] HX)  06/03/2020   Bibasilar atelectasis. No focal consoliadation definitively identified.   dilation for GERD     ESOPHAGOGASTRODUODENOSCOPY  11/24/2017   gastric polyps x 2, otherwise normal.   ESOPHAGOGASTRODUODENOSCOPY N/A 06/14/2020   +distal esophagitis, h pylori neg, mild distal esoph stenosis widened with forceps, benign gastric polypsProcedure: ESOPHAGOGASTRODUODENOSCOPY (EGD);  Surgeon: San Sandor GAILS, DO;  Location: [facility];  Service: Gastroenterology;  Laterality: N/A;  Diagnostic EGD as patietn last took Plavix  11am on 06/12/2020   KIDNEY TRANSPLANT  11/06/2012   [facility] (cadaveric)   LIGATION OF ARTERIOVENOUS  FISTULA Left 10/21/2021   Procedure: LIGATION OF LEFT ARTERIOVENOUS  FISTULA;  Surgeon: Blade Lonni GORMAN, MD;  Location: [facility] OR;  Service: Vascular;  Laterality: Left;   PROSTATE BIOPSY  2011   no malignancy   TRANSTHORACIC ECHOCARDIOGRAM  11/2016   Normal LV systolic fxn, EF 44-39%.  Grade I DD.  Mild aortic root dilatation, mild MV regurg.   Virtual colonoscopy     07/2023 NO POLYPS   Patient Active Problem List   Diagnosis Date Noted   Chronic cholecystitis without calculus 08/18/2021   Gallbladder sludge 07/22/2021   Abdominal pain, RUQ (right upper quadrant) 07/22/2021   RUQ abdominal pain 06/04/2021   Peptic stricture of esophagus    Emesis 06/13/2020   Weight loss 06/13/2020   Cough 06/13/2020   AKI (acute kidney injury) 06/13/2020   Metabolic acidosis 06/13/2020   Esophageal dysphagia    Postinflammatory pulmonary fibrosis (HCC) 08/30/2019   DOE (dyspnea on exertion) 08/29/2019   Overweight (BMI 25.0-29.9) 07/27/2019   GIB (gastrointestinal bleeding) 10/07/2017   IFG (impaired fasting glucose) 06/08/2017   Stroke (HCC)  11/30/2016   Essential hypertension 01/16/2015   S/p cadaver renal transplant 11/06/2012   Chronic kidney disease (CKD), stage IV (severe) (HCC) 03/18/2012   BENIGN PROSTATIC HYPERTROPHY 04/18/2008   Extrinsic asthma 12/19/2007   PSA, INCREASED 12/09/2007   Hypothyroidism 08/12/2007   Bipolar disorder (HCC) 07/27/2007   Hyperlipidemia 07/07/2007   Gout 07/07/2007   Essential hypertension 07/07/2007   GERD 07/07/2007     PCP: Aleene Hockey  REFERRING PROVIDER: Aleene Hockey  REFERRING DIAG: Unstable gait   THERAPY DIAG:  Other abnormalities of gait and mobility  Rationale for Evaluation and Treatment: Rehabilitation   ONSET DATE:    SUBJECTIVE:   SUBJECTIVE STATEMENT: Pt states he has had 2 instances of feeling slightly dizzy when standing and turning/changing directions. He feels like he is doing well with his balance overall since starting.    Eval: Pt reports he Had stroke 5/6 years ago-, states decreased balance since then. Notices decreased balance in last several months. Fell 2 x in last 2  weeks. 1 walking at park, going up incline,  and once at home when standing up and turning from chair.  AD: does not use.  Does have cane and walker but does not use.  Gets dizzy when getting up and down at church, but states not at other times. Thinks it is when he sings.  BP - states his meds make it low. Home: Lives with wife. Steps: has 1,  2 in step to get in, with no hand rail.  Driving: Yes, he drives wife who has bad vision.  Recent chest pain- found to be non cardiac- was coughing a lot.  Exercise: 1x/wk tries to walk at some of the parks. Usually goes for 20 min.  [facility] 1[facility].  Denies SOB or dizziness with walking    PERTINENT HISTORY: Stroke, Kidney transplant, HTN,    PAIN:  Are you having pain? No   PRECAUTIONS: Fall  WEIGHT BEARING RESTRICTIONS: No  FALLS:  Has patient fallen in last 6 months? Yes. Number  of falls 2   PLOF: Independent  PATIENT GOALS:  improve balance, decrease falls.   NEXT MD VISIT:   OBJECTIVE:   DIAGNOSTIC FINDINGS:   PATIENT SURVEYS:    COGNITION: Overall cognitive status: Within functional limits for tasks assessed     SENSATION: WFL  EDEMA:  n/a    POSTURE:    No Significant postural limitations  PALPATION:   LOWER EXTREMITY ROM: Hips: WFL, Knees: WFL   LOWER EXTREMITY MMT:  MMT Left 12/25/24 Right  12/25/24  Hip flexion 4+ 4+  Hip extension    Hip abduction    Hip adduction    Hip internal rotation    Hip external rotation    Knee flexion 4+ 4+  Knee extension 4+ 4+  Ankle dorsiflexion    Ankle plantarflexion    Ankle inversion    Ankle eversion     (Blank rows = not tested)   LOWER EXTREMITY SPECIAL TESTS:     FUNCTIONAL TESTS:  Eval:  5 time sit to stand: 30.22 sec .                 12/07/24:  16.35 TUG:  15.13- used hands to rise from chair       12/14/24   TUG:  10.72 sec no UE support to rise    12/25/24: 5 time sit to stand:  14.72  BERG:  [facility] PT Assessment - 12/25/24 0001       Standardized Balance Assessment   Standardized Balance Assessment Berg Balance Test      Berg Balance Test   Sit to Stand Able to stand without using hands and stabilize independently    Standing Unsupported Able to stand safely 2 minutes    Sitting with Back Unsupported but Feet Supported on Floor or Stool Able to sit safely and securely 2 minutes    Stand to Sit Sits safely with minimal use of hands    Transfers Able to transfer safely, minor use of hands    Standing Unsupported with Eyes Closed Able to stand 10 seconds with supervision    Standing Unsupported with Feet Together Able to place feet together independently and stand for 1 minute with supervision    From Standing, Reach Forward with Outstretched Arm Can reach forward >12 cm safely (5)    From Standing Position, Pick up Object from Floor Able to pick up shoe, needs  supervision  From Standing Position, Turn to Look Behind Over each Shoulder Looks behind one side only/other side shows less weight shift    Turn 360 Degrees Able to turn 360 degrees safely but slowly    Standing Unsupported, Alternately Place Feet on Step/Stool Able to complete 4 steps without aid or supervision    Standing Unsupported, One Foot in Front Able to take small step independently and hold 30 seconds    Standing on One Leg Tries to lift leg/unable to hold 3 seconds but remains standing independently    Total Score 42           [facility] PT Assessment - 12/25/24 0001       Standardized Balance Assessment   Standardized Balance Assessment Berg Balance Test      Berg Balance Test   Sit to Stand Able to stand without using hands and stabilize independently    Standing Unsupported Able to stand safely 2 minutes    Sitting with Back Unsupported but Feet Supported on Floor or Stool Able to sit safely and securely 2 minutes    Stand to Sit Sits safely with minimal use of hands    Transfers Able to transfer safely, minor use of hands    Standing Unsupported with Eyes Closed Able to stand 10 seconds with supervision    Standing Unsupported with Feet Together Able to place feet together independently and stand for 1 minute with supervision    From Standing, Reach Forward with Outstretched Arm Can reach forward >12 cm safely (5)    From Standing Position, Pick up Object from Floor Able to pick up shoe, needs supervision    From Standing Position, Turn to Look Behind Over each Shoulder Looks behind one side only/other side shows less weight shift    Turn 360 Degrees Able to turn 360 degrees safely but slowly    Standing Unsupported, Alternately Place Feet on Step/Stool Able to complete 4 steps without aid or supervision    Standing Unsupported, One Foot in Front Able to take small step independently and hold 30 seconds    Standing on One Leg Tries to lift leg/unable to hold 3 seconds but  remains standing independently    Total Score 42          [facility] PT Assessment - 12/25/24 0001       Standardized Balance Assessment   Standardized Balance Assessment Berg Balance Test      Berg Balance Test   Sit to Stand Able to stand without using hands and stabilize independently    Standing Unsupported Able to stand safely 2 minutes    Sitting with Back Unsupported but Feet Supported on Floor or Stool Able to sit safely and securely 2 minutes    Stand to Sit Sits safely with minimal use of hands    Transfers Able to transfer safely, minor use of hands    Standing Unsupported with Eyes Closed Able to stand 10 seconds with supervision    Standing Unsupported with Feet Together Able to place feet together independently and stand for 1 minute with supervision    From Standing, Reach Forward with Outstretched Arm Can reach forward >12 cm safely (5)    From Standing Position, Pick up Object from Floor Able to pick up shoe, needs supervision    From Standing Position, Turn to Look Behind Over each Shoulder Looks behind one side only/other side shows less weight shift    Turn 360 Degrees Able to turn 360 degrees safely  but slowly    Standing Unsupported, Alternately Place Feet on Step/Stool Able to complete 4 steps without aid or supervision    Standing Unsupported, One Foot in Front Able to take small step independently and hold 30 seconds    Standing on One Leg Tries to lift leg/unable to hold 3 seconds but remains standing independently    Total Score 42          GAIT: Distance walked: 150 Assistive device utilized: None Level of assistance: Complete Independence Comments: decreased balance, slow speed, likley would benefit from use of SPC   TODAY'S TREATMENT:                                                                                                                              DATE:   12/25/2024 Therapeutic Exercise: Aerobic: Supine:   Seated:  Standing:    Stretches:  Neuromuscular Re-education: Marching x 20/slow  Ambulation with cueing for higher step height 45 ft x 6;  Manual Therapy:  Therapeutic Activity:  hip abd 2 x 10 ,  Squats at chair 2 x 10; cueing to slow down.  Stairs, up/down 5 steps with 1 hand rail x 4 Step ups 6 in step x 10 bil; , 1 ue support  Self Care: Physical Performance Measure: 5 time sit to stand and BERG - see scores above.     Therapeutic Exercise: Aerobic: Supine:  SLR 2  x 10 bil;   clams Blue TB x 20;   bridging 2 x 10  Seated: LAQ 4 lb ,  2 x 10 bil  Standing:   Stretches:  Neuromuscular Re-education: Manual Therapy:  Therapeutic Activity: Sit to stand :   regular chair height  2 x 5 no hands  Heel raises x 15 at counter;     hip abd 2 x 10 ,  Self Care:   PATIENT EDUCATION:  Education details: updated and reviewed HEP Person educated: Patient Education method: Explanation, Demonstration, Tactile cues, Verbal cues, and Handouts Education comprehension: verbalized understanding, returned demonstration, verbal cues required, tactile cues required, and needs further education   HOME EXERCISE PROGRAM: Access Code: [email or web address]   ASSESSMENT:  CLINICAL IMPRESSION: Pt showing good progress with balance with scoring on berg today. He does continue to require cuing for slowing down activity, as well as increasing step height. He will benefit from continued care , to improve dynamic balance and safety with direction changes, stairs, and foot clearance.     Eval: Patient presents with primary complaint of  decreased balance. He has decreased sores on berg,and 5 time sit to stand. He will benefit from practice with head turns in standing and with walking, and work on dynamic balance, to improve safety with his outdoor walking.  Pt with decreased ability for full functional activities. Pt will  benefit from skilled PT to improve deficits and pain and to return to PLOF.   OBJECTIVE IMPAIRMENTS: Abnormal  gait, decreased activity  tolerance, decreased balance, decreased knowledge of use of DME, decreased mobility, decreased strength, impaired vision/preception, improper body mechanics, and pain.   ACTIVITY LIMITATIONS: bending, standing, squatting, stairs, transfers, and locomotion level  PARTICIPATION LIMITATIONS: meal prep, cleaning, shopping, and community activity  PERSONAL FACTORS: Time since onset of injury/illness/exacerbation and 1 comorbidity: stroke,  are also affecting patient's functional outcome.   REHAB POTENTIAL: Good  CLINICAL DECISION MAKING: Evolving/moderate complexity  EVALUATION COMPLEXITY: Moderate   GOALS: Goals reviewed with patient? Yes  SHORT TERM GOALS: Target date: 11/16/2024   Pt to be independent with initial HEP  Goal status: MET  2.  Pt to demo ability to rise from higher mat table, without use of UEs.   Goal status: MET    LONG TERM GOALS: Target date: 02/05/2025   Pt to be independent with final HEP  Goal status: In progress   2.  Pt to improve score on 5 time sit to stand by at least 10 sec.   Goal status: MET  3.  Pt to improve score on BERG by at least 5 points.   Goal status: MET  4.  Pt to demo ambulation on level surfaces to be [facility] , with dynamic movement, head turns and direction changes .  Goal status: In progress   5. Pt to demo safe ability to navigate at least 5 steps with 1 hand rail, to improve ability for community navigation.    Goal status: New 12/25/24         PLAN:  PT FREQUENCY: 1-2x/week  PT DURATION: 8 weeks  PLANNED INTERVENTIONS: Therapeutic exercises, Therapeutic activity, Neuromuscular re-education, Patient/Family education, Self Care, Joint mobilization, Joint manipulation, Stair training, Orthotic/Fit training, DME instructions, Aquatic Therapy, Dry Needling, Electrical stimulation, Cryotherapy, Moist heat, Taping, Ultrasound, Ionotophoresis 4mg /ml Dexamethasone , Manual therapy,  Vasopneumatic  device, Traction, Spinal manipulation, Spinal mobilization,Balance training, Gait training,   PLAN FOR NEXT SESSION: more challenged on R,   R hip flex strength/ SLR,  clams,  step ups (both feet), foot clearance with walking and stairs. dynamic balance. Stairs, * walking with head turns and direction changes,  around cones.     Tinnie Don, PT, DPT 1:19 PM  12/25/2024   "

## 2025-01-01 ENCOUNTER — Encounter: Payer: Self-pay | Admitting: Physical Therapy

## 2025-01-01 ENCOUNTER — Ambulatory Visit: Admitting: Physical Therapy

## 2025-01-01 DIAGNOSIS — R2689 Other abnormalities of gait and mobility: Secondary | ICD-10-CM

## 2025-01-01 NOTE — Therapy (Signed)
 " OUTPATIENT PHYSICAL THERAPY LOWER EXTREMITY TREATMENT   Patient Name: Curtis Jimenez MRN: [id number] DOB:Jun 02, 1936, 89 y.o., male Today's Date: 01/01/2025    END OF SESSION:  PT End of Session - 01/01/25 1215     Visit Number 11    Number of Visits 16    Date for Recertification  02/05/25    Authorization Type HTA    PT Start Time 1217    PT Stop Time 1300    PT Time Calculation (min) 43 min    Equipment Utilized During Treatment Gait belt    Activity Tolerance Patient tolerated treatment well    Behavior During Therapy WFL for tasks assessed/performed             Past Medical History:  Diagnosis Date   Basal cell carcinoma    neck (skin MD 2X per year)   Bifascicular block 2021   ectopy, asymptomatic.  Cards->observe   Bipolar disorder (HCC)    BPH (benign prostatic hyperplasia)    with hx of elevated PSA; followed by [facility] as of 2024   Chronic renal insufficiency, stage III (moderate)    in pt w/hx of renal transplant.  Post-transplant sCr 1.4-1.6.   Coronary atherosclerosis    LM and 3 V dz noted on CT 08/2019 performed for eval of interstitial lung dz in the setting of mild DOE and mild hypoxia. Cards->no stress tesing indicated->primary prevention emphasized   CVA (cerebral vascular accident) (HCC) 11/2016   Pontine (vertebrobasilar--imaging neg), TPA given.  Pt discharged on plavix .  Carotid dopplers ok, echocardiogram ok.  Residual deficit: vertigo but this improved greatly with therapy.   DOE (dyspnea on exertion) 2020   DOE and ? hypoxia->CXR 08/29/19--->diffuse interstitial opacities-? acute inflammatory process->Dr. Darlean eval'd him and was underwhelmed by the CXR and exam->CT chest 09/26/19 Very subtle areas of mild ground-glass attenuatio-nonspecific, mild air trapping. Per Dr. Darlean, no signif ILD, improving with inc ambulation (post-inflamm pulm fibrosis?)   Elevated PSA    12/2022, mild, referred to urology   Esophagitis 06/14/2020    EGD->+distal esophagitis, h pylori neg, mild distal esoph stenosis widened with forceps, benign gastric polyps.   GERD (gastroesophageal reflux disease)    Gout    History of adenomatous polyp of colon 2018   Recall 5 yrs   Hyperlipidemia    Hypertension    Hypothyroidism    Lumbar spondylosis    Ptosis due to aging    Right   Rectal bleeding 09/2017   Admitted for obs due to Hb drop.  GI consulted---obs recommended.  No transfusion required.  Plavix  was d/c'd, ferrous sulfate  recommended.  Outpt GI f/u--plavix  restarted and upper endoscopy was normal and colonoscopy showed 2 polyps and severe diverticular dz. Iron d/c'd 04/24/19.   Renal cyst    per pt he got MRI kidneys at the [facility] 12/2020 and the images will be viewed by Dr. Clayborn   Restless legs syndrome    S/p cadaver renal transplant 2013   Secondary to HTN +lithium toxicity over 30 yrs caused kidney destruction [facility] transplant MDs)   Seborrheic dermatitis    eyebrows worst: Hytone  rx'd by Dr. Livingston.   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   left jawline(CX35FU), Left forehead (CX35FU) right shoulder (CX35FU), R forearm   Swallowing dysfunction    thin liquids (swallowing eval 01/2023)   Past Surgical History:  Procedure Laterality Date   AV FISTULA PLACEMENT  04/08/2012   Procedure: ARTERIOVENOUS (AV) FISTULA  CREATION;  Surgeon: Lonni GORMAN Blade, MD;  Location: [facility] OR;  Service: Vascular;  Laterality: Left;  Creation of left brachial cephalic arteriovenous fistula   BIOPSY  06/14/2020   Procedure: BIOPSY;  Surgeon: San Sandor GAILS, DO;  Location: [facility] ENDOSCOPY;  Service: Gastroenterology;;   CATARACT EXTRACTION, BILATERAL  05/2018   CHOLECYSTECTOMY N/A 08/18/2021   Procedure: LAPAROSCOPIC CHOLECYSTECTOMY WITH CHOLANGIOGRAM;  Surgeon: Eletha Boas, MD;  Location: [facility] ORS;  Service: General;  Laterality: N/A;   COLONOSCOPY  2013; 11/24/17   2013; Normal except diverticulosis (recall 10 yrs).  2018  (for GI bleeding)--severe diverticular dz + 2 adenomatous polyps.  Recall 5 yrs.   DG CHEST AP OR PA ([facility] HX)  06/03/2020   Bibasilar atelectasis. No focal consoliadation definitively identified.   dilation for GERD     ESOPHAGOGASTRODUODENOSCOPY  11/24/2017   gastric polyps x 2, otherwise normal.   ESOPHAGOGASTRODUODENOSCOPY N/A 06/14/2020   +distal esophagitis, h pylori neg, mild distal esoph stenosis widened with forceps, benign gastric polypsProcedure: ESOPHAGOGASTRODUODENOSCOPY (EGD);  Surgeon: San Sandor GAILS, DO;  Location: [facility];  Service: Gastroenterology;  Laterality: N/A;  Diagnostic EGD as patietn last took Plavix  11am on 06/12/2020   KIDNEY TRANSPLANT  11/06/2012   [facility] (cadaveric)   LIGATION OF ARTERIOVENOUS  FISTULA Left 10/21/2021   Procedure: LIGATION OF LEFT ARTERIOVENOUS  FISTULA;  Surgeon: Blade Lonni GORMAN, MD;  Location: [facility] OR;  Service: Vascular;  Laterality: Left;   PROSTATE BIOPSY  2011   no malignancy   TRANSTHORACIC ECHOCARDIOGRAM  11/2016   Normal LV systolic fxn, EF 44-39%.  Grade I DD.  Mild aortic root dilatation, mild MV regurg.   Virtual colonoscopy     07/2023 NO POLYPS   Patient Active Problem List   Diagnosis Date Noted   Chronic cholecystitis without calculus 08/18/2021   Gallbladder sludge 07/22/2021   Abdominal pain, RUQ (right upper quadrant) 07/22/2021   RUQ abdominal pain 06/04/2021   Peptic stricture of esophagus    Emesis 06/13/2020   Weight loss 06/13/2020   Cough 06/13/2020   AKI (acute kidney injury) 06/13/2020   Metabolic acidosis 06/13/2020   Esophageal dysphagia    Postinflammatory pulmonary fibrosis (HCC) 08/30/2019   DOE (dyspnea on exertion) 08/29/2019   Overweight (BMI 25.0-29.9) 07/27/2019   GIB (gastrointestinal bleeding) 10/07/2017   IFG (impaired fasting glucose) 06/08/2017   Stroke (HCC) 11/30/2016   Essential hypertension 01/16/2015   S/p cadaver renal transplant 11/06/2012   Chronic kidney disease  (CKD), stage IV (severe) (HCC) 03/18/2012   BENIGN PROSTATIC HYPERTROPHY 04/18/2008   Extrinsic asthma 12/19/2007   PSA, INCREASED 12/09/2007   Hypothyroidism 08/12/2007   Bipolar disorder (HCC) 07/27/2007   Hyperlipidemia 07/07/2007   Gout 07/07/2007   Essential hypertension 07/07/2007   GERD 07/07/2007     PCP: Aleene Hockey  REFERRING PROVIDER: Aleene Hockey  REFERRING DIAG: Unstable gait   THERAPY DIAG:  Other abnormalities of gait and mobility  Rationale for Evaluation and Treatment: Rehabilitation   ONSET DATE:    SUBJECTIVE:   SUBJECTIVE STATEMENT:  Pt states slight dizziness when sitting for a while, getting up and turning to step , mostly to the Left.   Eval: Pt reports he Had stroke 5/6 years ago-, states decreased balance since then. Notices decreased balance in last several months. Fell 2 x in last 2 weeks. 1 walking at park, going up incline,  and once at home when standing up and turning from chair.  AD: does not use.  Does have cane  and walker but does not use.  Gets dizzy when getting up and down at church, but states not at other times. Thinks it is when he sings.  BP - states his meds make it low. Home: Lives with wife. Steps: has 1,  2 in step to get in, with no hand rail.  Driving: Yes, he drives wife who has bad vision.  Recent chest pain- found to be non cardiac- was coughing a lot.  Exercise: 1x/wk tries to walk at some of the parks. Usually goes for 20 min.  [facility] 1.5 [facility].  Denies SOB or dizziness with walking    PERTINENT HISTORY: Stroke, Kidney transplant, HTN,    PAIN:  Are you having pain? No   PRECAUTIONS: Fall  WEIGHT BEARING RESTRICTIONS: No  FALLS:  Has patient fallen in last 6 months? Yes. Number of falls 2   PLOF: Independent  PATIENT GOALS:  improve balance, decrease falls.   NEXT MD VISIT:   OBJECTIVE:   DIAGNOSTIC FINDINGS:   PATIENT SURVEYS:     COGNITION: Overall cognitive status: Within functional limits for tasks assessed     SENSATION: WFL  EDEMA:  n/a    POSTURE:    No Significant postural limitations  PALPATION:   LOWER EXTREMITY ROM: Hips: WFL, Knees: WFL   LOWER EXTREMITY MMT:  MMT Left 12/25/24 Right  12/25/24  Hip flexion 4+ 4+  Hip extension    Hip abduction    Hip adduction    Hip internal rotation    Hip external rotation    Knee flexion 4+ 4+  Knee extension 4+ 4+  Ankle dorsiflexion    Ankle plantarflexion    Ankle inversion    Ankle eversion     (Blank rows = not tested)   LOWER EXTREMITY SPECIAL TESTS:     FUNCTIONAL TESTS:  Eval:  5 time sit to stand: 30.22 sec .                 12/07/24:  16.35 TUG:  15.13- used hands to rise from chair       12/14/24   TUG:  10.72 sec no UE support to rise    12/25/24: 5 time sit to stand:  14.72      GAIT: Distance walked: 150 Assistive device utilized: None Level of assistance: Complete Independence Comments: decreased balance, slow speed, likley would benefit from use of SPC   TODAY'S TREATMENT:                                                                                                                              DATE:   01/01/2025  Therapeutic Exercise: Aerobic: Supine:   Seated:  Standing:   Stretches:  Neuromuscular Re-education: Marching x 20/slow  Weaving around cones x 6 Walking with head turns L/R x 5 Sit to stand, with quick turn L  , R x 5 each Manual Therapy:  Therapeutic Activity:  hip abd 2 x 10 ,  Squats at mat table x 15 , cueing to slow down.  Stairs, up/down 5 steps with no hand rail, x 6 Step ups 6 in step x 10 bil; light 1 ue support   Self Care:    Previous: Therapeutic Exercise: Aerobic: Supine:   Seated:  Standing:   Stretches:  Neuromuscular Re-education: Marching x 20/slow  Ambulation with cueing for higher step height 45 ft x 6;  Manual Therapy:  Therapeutic Activity:   hip abd 2 x 10 ,  Squats at chair 2 x 10; cueing to slow down.  Stairs, up/down 5 steps with 1 hand rail x 4 Step ups 6 in step x 10 bil; , 1 ue support  Self Care:    Therapeutic Exercise: Aerobic: Supine:  SLR 2  x 10 bil;   clams Blue TB x 20;   bridging 2 x 10  Seated: LAQ 4 lb ,  2 x 10 bil  Standing:   Stretches:  Neuromuscular Re-education: Manual Therapy:  Therapeutic Activity: Sit to stand :   regular chair height  2 x 5 no hands  Heel raises x 15 at counter;     hip abd 2 x 10 ,  Self Care:   PATIENT EDUCATION:  Education details: updated and reviewed HEP Person educated: Patient Education method: Explanation, Demonstration, Tactile cues, Verbal cues, and Handouts Education comprehension: verbalized understanding, returned demonstration, verbal cues required, tactile cues required, and needs further education   HOME EXERCISE PROGRAM: Access Code: [email or web address]   ASSESSMENT:  CLINICAL IMPRESSION: Pt able to perform head turns with static standing as well as with gait today. It challenges his balance, but does not elicit dizziness. We discussed change of body position, supine to sit or sit to stand being more likely cause of slight dizziness when getting up and going quickly. He will monitor and recommended he discuss with MD in near future if it persists. Pt progressing with ability for dynamic balance, He still requires cueing for improving step height for foot clearance.     Eval: Patient presents with primary complaint of  decreased balance. He has decreased sores on berg,and 5 time sit to stand. He will benefit from practice with head turns in standing and with walking, and work on dynamic balance, to improve safety with his outdoor walking.  Pt with decreased ability for full functional activities. Pt will  benefit from skilled PT to improve deficits and pain and to return to PLOF.   OBJECTIVE IMPAIRMENTS: Abnormal gait, decreased activity tolerance, decreased  balance, decreased knowledge of use of DME, decreased mobility, decreased strength, impaired vision/preception, improper body mechanics, and pain.   ACTIVITY LIMITATIONS: bending, standing, squatting, stairs, transfers, and locomotion level  PARTICIPATION LIMITATIONS: meal prep, cleaning, shopping, and community activity  PERSONAL FACTORS: Time since onset of injury/illness/exacerbation and 1 comorbidity: stroke,  are also affecting patient's functional outcome.   REHAB POTENTIAL: Good  CLINICAL DECISION MAKING: Evolving/moderate complexity  EVALUATION COMPLEXITY: Moderate   GOALS: Goals reviewed with patient? Yes  SHORT TERM GOALS: Target date: 11/16/2024   Pt to be independent with initial HEP  Goal status: MET  2.  Pt to demo ability to rise from higher mat table, without use of UEs.   Goal status: MET    LONG TERM GOALS: Target date: 02/05/2025   Pt to be independent with final HEP  Goal status: In progress   2.  Pt to improve score on 5  time sit to stand by at least 10 sec.   Goal status: MET  3.  Pt to improve score on BERG by at least 5 points.   Goal status: MET  4.  Pt to demo ambulation on level surfaces to be [facility] , with dynamic movement, head turns and direction changes .  Goal status: In progress   5. Pt to demo safe ability to navigate at least 5 steps with 1 hand rail, to improve ability for community navigation.    Goal status: New 12/25/24       PLAN:  PT FREQUENCY: 1-2x/week  PT DURATION: 8 weeks  PLANNED INTERVENTIONS: Therapeutic exercises, Therapeutic activity, Neuromuscular re-education, Patient/Family education, Self Care, Joint mobilization, Joint manipulation, Stair training, Orthotic/Fit training, DME instructions, Aquatic Therapy, Dry Needling, Electrical stimulation, Cryotherapy, Moist heat, Taping, Ultrasound, Ionotophoresis 4mg /ml Dexamethasone , Manual therapy,  Vasopneumatic device, Traction, Spinal manipulation, Spinal  mobilization,Balance training, Gait training,   PLAN FOR NEXT SESSION:  more challenged on R,   R hip flex strength/ SLR,  clams,  step ups (both feet), foot clearance with walking and stairs. dynamic balance. Stairs, * walking with head turns and direction changes,  around cones.     Tinnie Don, PT, DPT 12:15 PM  01/01/2025   "

## 2025-01-08 ENCOUNTER — Encounter: Payer: Self-pay | Admitting: Physical Therapy

## 2025-01-08 ENCOUNTER — Ambulatory Visit: Admitting: Physical Therapy

## 2025-01-08 DIAGNOSIS — R2689 Other abnormalities of gait and mobility: Secondary | ICD-10-CM

## 2025-01-08 NOTE — Therapy (Signed)
 " OUTPATIENT PHYSICAL THERAPY LOWER EXTREMITY TREATMENT   Patient Name: Curtis Jimenez MRN: [id number] DOB:1936/04/17, 89 y.o., male Today's Date: 01/08/2025    END OF SESSION:  PT End of Session - 01/08/25 1607     Visit Number 12    Number of Visits 16    Date for Recertification  02/05/25    Authorization Type HTA    PT Start Time 1608    PT Stop Time 1646    PT Time Calculation (min) 38 min    Equipment Utilized During Treatment Gait belt    Activity Tolerance Patient tolerated treatment well    Behavior During Therapy WFL for tasks assessed/performed             Past Medical History:  Diagnosis Date   Basal cell carcinoma    neck (skin MD 2X per year)   Bifascicular block 2021   ectopy, asymptomatic.  Cards->observe   Bipolar disorder (HCC)    BPH (benign prostatic hyperplasia)    with hx of elevated PSA; followed by [facility] as of 2024   Chronic renal insufficiency, stage III (moderate)    in pt w/hx of renal transplant.  Post-transplant sCr 1.4-1.6.   Coronary atherosclerosis    LM and 3 V dz noted on CT 08/2019 performed for eval of interstitial lung dz in the setting of mild DOE and mild hypoxia. Cards->no stress tesing indicated->primary prevention emphasized   CVA (cerebral vascular accident) (HCC) 11/2016   Pontine (vertebrobasilar--imaging neg), TPA given.  Pt discharged on plavix .  Carotid dopplers ok, echocardiogram ok.  Residual deficit: vertigo but this improved greatly with therapy.   DOE (dyspnea on exertion) 2020   DOE and ? hypoxia->CXR 08/29/19--->diffuse interstitial opacities-? acute inflammatory process->Dr. Darlean eval'd him and was underwhelmed by the CXR and exam->CT chest 09/26/19 Very subtle areas of mild ground-glass attenuatio-nonspecific, mild air trapping. Per Dr. Darlean, no signif ILD, improving with inc ambulation (post-inflamm pulm fibrosis?)   Elevated PSA    12/2022, mild, referred to urology   Esophagitis 06/14/2020    EGD->+distal esophagitis, h pylori neg, mild distal esoph stenosis widened with forceps, benign gastric polyps.   GERD (gastroesophageal reflux disease)    Gout    History of adenomatous polyp of colon 2018   Recall 5 yrs   Hyperlipidemia    Hypertension    Hypothyroidism    Lumbar spondylosis    Ptosis due to aging    Right   Rectal bleeding 09/2017   Admitted for obs due to Hb drop.  GI consulted---obs recommended.  No transfusion required.  Plavix  was d/c'd, ferrous sulfate  recommended.  Outpt GI f/u--plavix  restarted and upper endoscopy was normal and colonoscopy showed 2 polyps and severe diverticular dz. Iron d/c'd 04/24/19.   Renal cyst    per pt he got MRI kidneys [facility] 12/2020 and the images will be viewed by Dr. Clayborn   Restless legs syndrome    S/p cadaver renal transplant 2013   Secondary to HTN +lithium toxicity over 30 yrs caused kidney destruction [facility] transplant MDs)   Seborrheic dermatitis    eyebrows worst: Hytone  rx'd by Dr. Livingston.   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   left jawline(CX35FU), Left forehead (CX35FU) right shoulder (CX35FU), R forearm   Swallowing dysfunction    thin liquids (swallowing eval 01/2023)   Past Surgical History:  Procedure Laterality Date   AV FISTULA PLACEMENT  04/08/2012   Procedure: ARTERIOVENOUS (AV) FISTULA  CREATION;  Surgeon: Lonni GORMAN Blade, MD;  Location: [facility] OR;  Service: Vascular;  Laterality: Left;  Creation of left brachial cephalic arteriovenous fistula   BIOPSY  06/14/2020   Procedure: BIOPSY;  Surgeon: San Sandor GAILS, DO;  Location: [facility];  Service: Gastroenterology;;   CATARACT EXTRACTION, BILATERAL  05/2018   CHOLECYSTECTOMY N/A 08/18/2021   Procedure: LAPAROSCOPIC CHOLECYSTECTOMY WITH CHOLANGIOGRAM;  Surgeon: Eletha Boas, MD;  Location: [facility] ORS;  Service: General;  Laterality: N/A;   COLONOSCOPY  2013; 11/24/17   2013; Normal except diverticulosis (recall 10 yrs).  2018  (for GI bleeding)--severe diverticular dz + 2 adenomatous polyps.  Recall 5 yrs.   DG CHEST AP OR PA ([facility] HX)  06/03/2020   Bibasilar atelectasis. No focal consoliadation definitively identified.   dilation for GERD     ESOPHAGOGASTRODUODENOSCOPY  11/24/2017   gastric polyps x 2, otherwise normal.   ESOPHAGOGASTRODUODENOSCOPY N/A 06/14/2020   +distal esophagitis, h pylori neg, mild distal esoph stenosis widened with forceps, benign gastric polypsProcedure: ESOPHAGOGASTRODUODENOSCOPY (EGD);  Surgeon: San Sandor GAILS, DO;  Location: [facility];  Service: Gastroenterology;  Laterality: N/A;  Diagnostic EGD as patietn last took Plavix  11am on 06/12/2020   KIDNEY TRANSPLANT  11/06/2012   [facility] (cadaveric)   LIGATION OF ARTERIOVENOUS  FISTULA Left 10/21/2021   Procedure: LIGATION OF LEFT ARTERIOVENOUS  FISTULA;  Surgeon: Blade Lonni GORMAN, MD;  Location: [facility] OR;  Service: Vascular;  Laterality: Left;   PROSTATE BIOPSY  2011   no malignancy   TRANSTHORACIC ECHOCARDIOGRAM  11/2016   Normal LV systolic fxn, EF 44-39%.  Grade I DD.  Mild aortic root dilatation, mild MV regurg.   Virtual colonoscopy     07/2023 NO POLYPS   Patient Active Problem List   Diagnosis Date Noted   Chronic cholecystitis without calculus 08/18/2021   Gallbladder sludge 07/22/2021   Abdominal pain, RUQ (right upper quadrant) 07/22/2021   RUQ abdominal pain 06/04/2021   Peptic stricture of esophagus    Emesis 06/13/2020   Weight loss 06/13/2020   Cough 06/13/2020   AKI (acute kidney injury) 06/13/2020   Metabolic acidosis 06/13/2020   Esophageal dysphagia    Postinflammatory pulmonary fibrosis (HCC) 08/30/2019   DOE (dyspnea on exertion) 08/29/2019   Overweight (BMI 25.0-29.9) 07/27/2019   GIB (gastrointestinal bleeding) 10/07/2017   IFG (impaired fasting glucose) 06/08/2017   Stroke (HCC) 11/30/2016   Essential hypertension 01/16/2015   S/p cadaver renal transplant 11/06/2012   Chronic kidney disease  (CKD), stage IV (severe) (HCC) 03/18/2012   BENIGN PROSTATIC HYPERTROPHY 04/18/2008   Extrinsic asthma 12/19/2007   PSA, INCREASED 12/09/2007   Hypothyroidism 08/12/2007   Bipolar disorder (HCC) 07/27/2007   Hyperlipidemia 07/07/2007   Gout 07/07/2007   Essential hypertension 07/07/2007   GERD 07/07/2007     PCP: Aleene Hockey  REFERRING PROVIDER: Aleene Hockey  REFERRING DIAG: Unstable gait   THERAPY DIAG:  Other abnormalities of gait and mobility  Rationale for Evaluation and Treatment: Rehabilitation   ONSET DATE:    SUBJECTIVE:   SUBJECTIVE STATEMENT: Pt with no new complaints.   Eval: Pt reports he Had stroke 5/6 years ago-, states decreased balance since then. Notices decreased balance in last several months. Fell 2 x in last 2 weeks. 1 walking at park, going up incline,  and once at home when standing up and turning from chair.  AD: does not use.  Does have cane and walker but does not use.  Gets dizzy when getting up and down at church,  but states not at other times. Thinks it is when he sings.  BP - states his meds make it low. Home: Lives with wife. Steps: has 1,  2 in step to get in, with no hand rail.  Driving: Yes, he drives wife who has bad vision.  Recent chest pain- found to be non cardiac- was coughing a lot.  Exercise: 1x/wk tries to walk at some of the parks. Usually goes for 20 min.  [facility] 1[facility].  Denies SOB or dizziness with walking    PERTINENT HISTORY: Stroke, Kidney transplant, HTN,    PAIN:  Are you having pain? No   PRECAUTIONS: Fall  WEIGHT BEARING RESTRICTIONS: No  FALLS:  Has patient fallen in last 6 months? Yes. Number of falls 2   PLOF: Independent  PATIENT GOALS:  improve balance, decrease falls.   NEXT MD VISIT:   OBJECTIVE:   DIAGNOSTIC FINDINGS:   PATIENT SURVEYS:    COGNITION: Overall cognitive status: Within functional limits for tasks  assessed     SENSATION: WFL  EDEMA:  n/a    POSTURE:    No Significant postural limitations  PALPATION:   LOWER EXTREMITY ROM: Hips: WFL, Knees: WFL   LOWER EXTREMITY MMT:  MMT Left 12/25/24 Right  12/25/24  Hip flexion 4+ 4+  Hip extension    Hip abduction    Hip adduction    Hip internal rotation    Hip external rotation    Knee flexion 4+ 4+  Knee extension 4+ 4+  Ankle dorsiflexion    Ankle plantarflexion    Ankle inversion    Ankle eversion     (Blank rows = not tested)   LOWER EXTREMITY SPECIAL TESTS:     FUNCTIONAL TESTS:  Eval:  5 time sit to stand: 30.22 sec .                 12/07/24:  16.35 TUG:  15.13- used hands to rise from chair       12/14/24   TUG:  10.72 sec no UE support to rise    12/25/24: 5 time sit to stand:  14.72     GAIT: Distance walked: 150 Assistive device utilized: None Level of assistance: Complete Independence Comments: decreased balance, slow speed, likley would benefit from use of SPC   TODAY'S TREATMENT:                                                                                                                              DATE:   01/08/2025 Therapeutic Exercise: Aerobic: Supine:   Seated:  Standing:   Stretches:  Neuromuscular Re-education: Marching x 20/slow  Weaving around cones x 6 Step up over cones x 6 Walking with head turns L/R x 5 Toe taps on 6 in step x 25;  Sit to stand, with quick turn L  , R  x 5 each Multi-direction walking  Manual  Therapy:  Therapeutic Activity:  HR x 20 hip abd 2 x 10 ,  Squats at mat table x 15 , cueing to slow down.  Step ups 6 in step x 10 bil, no UE support (CGA) .     Therapeutic Exercise: Aerobic: Supine:   Seated:  Standing:   Stretches:  Neuromuscular Re-education: Marching x 20/slow  Weaving around cones x 6 Walking with head turns L/R x 5 Sit to stand, with quick turn L  , R x 5 each Manual Therapy:  Therapeutic Activity:  hip abd 2 x 10 ,   Squats at mat table x 15 , cueing to slow down.  Stairs, up/down 5 steps with no hand rail, x 6 Step ups 6 in step x 10 bil; light 1 ue support  Self Care:    Previous: Therapeutic Exercise: Aerobic: Supine:   Seated:  Standing:   Stretches:  Neuromuscular Re-education: Marching x 20/slow  Ambulation with cueing for higher step height 45 ft x 6;  Manual Therapy:  Therapeutic Activity:  hip abd 2 x 10 ,  Squats at chair 2 x 10; cueing to slow down.  Stairs, up/down 5 steps with 1 hand rail x 4 Step ups 6 in step x 10 bil; , 1 ue support  Self Care:    Therapeutic Exercise: Aerobic: Supine:  SLR 2  x 10 bil;   clams Blue TB x 20;   bridging 2 x 10  Seated: LAQ 4 lb ,  2 x 10 bil  Standing:   Stretches:  Neuromuscular Re-education: Manual Therapy:  Therapeutic Activity: Sit to stand :   regular chair height  2 x 5 no hands  Heel raises x 15 at counter;     hip abd 2 x 10 ,  Self Care:   PATIENT EDUCATION:  Education details: updated and reviewed HEP Person educated: Patient Education method: Explanation, Demonstration, Tactile cues, Verbal cues, and Handouts Education comprehension: verbalized understanding, returned demonstration, verbal cues required, tactile cues required, and needs further education   HOME EXERCISE PROGRAM: Access Code: [email or web address]   ASSESSMENT:  CLINICAL IMPRESSION:  Pt progressing with ability for activities. He has leg weakness noted with fatigue at end of session. He requires cueing for increasing foot clearance/step height with walking. Will benefit from continued LE strength and dynamic balance.     Eval: Patient presents with primary complaint of  decreased balance. He has decreased sores on berg,and 5 time sit to stand. He will benefit from practice with head turns in standing and with walking, and work on dynamic balance, to improve safety with his outdoor walking.  Pt with decreased ability for full functional activities. Pt will   benefit from skilled PT to improve deficits and pain and to return to PLOF.   OBJECTIVE IMPAIRMENTS: Abnormal gait, decreased activity tolerance, decreased balance, decreased knowledge of use of DME, decreased mobility, decreased strength, impaired vision/preception, improper body mechanics, and pain.   ACTIVITY LIMITATIONS: bending, standing, squatting, stairs, transfers, and locomotion level  PARTICIPATION LIMITATIONS: meal prep, cleaning, shopping, and community activity  PERSONAL FACTORS: Time since onset of injury/illness/exacerbation and 1 comorbidity: stroke,  are also affecting patient's functional outcome.   REHAB POTENTIAL: Good  CLINICAL DECISION MAKING: Evolving/moderate complexity  EVALUATION COMPLEXITY: Moderate   GOALS: Goals reviewed with patient? Yes  SHORT TERM GOALS: Target date: 11/16/2024   Pt to be independent with initial HEP  Goal status: MET  2.  Pt to demo ability to rise from higher  mat table, without use of UEs.   Goal status: MET    LONG TERM GOALS: Target date: 02/05/2025   Pt to be independent with final HEP  Goal status: In progress   2.  Pt to improve score on 5 time sit to stand by at least 10 sec.   Goal status: MET  3.  Pt to improve score on BERG by at least 5 points.   Goal status: MET  4.  Pt to demo ambulation on level surfaces to be [facility] , with dynamic movement, head turns and direction changes .  Goal status: In progress   5. Pt to demo safe ability to navigate at least 5 steps with 1 hand rail, to improve ability for community navigation.    Goal status: New 12/25/24      PLAN:  PT FREQUENCY: 1-2x/week  PT DURATION: 8 weeks  PLANNED INTERVENTIONS: Therapeutic exercises, Therapeutic activity, Neuromuscular re-education, Patient/Family education, Self Care, Joint mobilization, Joint manipulation, Stair training, Orthotic/Fit training, DME instructions, Aquatic Therapy, Dry Needling, Electrical stimulation,  Cryotherapy, Moist heat, Taping, Ultrasound, Ionotophoresis 4mg /ml Dexamethasone , Manual therapy,  Vasopneumatic device, Traction, Spinal manipulation, Spinal mobilization,Balance training, Gait training,   PLAN FOR NEXT SESSION:  more challenged on R,   R hip flex strength/ SLR,  clams,  step ups (both feet), foot clearance with walking and stairs. dynamic balance. Stairs, * walking with head turns and direction changes,  around cones.     Tinnie Don, PT, DPT 4:13 PM  01/08/2025   "

## 2025-01-15 ENCOUNTER — Encounter: Payer: Self-pay | Admitting: Physical Therapy

## 2025-01-15 ENCOUNTER — Ambulatory Visit: Admitting: Physical Therapy

## 2025-01-15 DIAGNOSIS — R2689 Other abnormalities of gait and mobility: Secondary | ICD-10-CM

## 2025-01-15 NOTE — Therapy (Unsigned)
 " OUTPATIENT PHYSICAL THERAPY LOWER EXTREMITY TREATMENT   Patient Name: CHRISTIE COPLEY MRN: [id number] DOB:Jan 01, 1936, 89 y.o., male Today's Date: 01/15/2025    END OF SESSION:  PT End of Session - 01/15/25 1104     Visit Number 13    Number of Visits 16    Date for Recertification  02/05/25    Authorization Type HTA    PT Start Time 1105    PT Stop Time 1145    PT Time Calculation (min) 40 min    Equipment Utilized During Treatment Gait belt    Activity Tolerance Patient tolerated treatment well    Behavior During Therapy WFL for tasks assessed/performed             Past Medical History:  Diagnosis Date   Basal cell carcinoma    neck (skin MD 2X per year)   Bifascicular block 2021   ectopy, asymptomatic.  Cards->observe   Bipolar disorder (HCC)    BPH (benign prostatic hyperplasia)    with hx of elevated PSA; followed by [facility] as of 2024   Chronic renal insufficiency, stage III (moderate)    in pt w/hx of renal transplant.  Post-transplant sCr 1.4-1.6.   Coronary atherosclerosis    LM and 3 V dz noted on CT 08/2019 performed for eval of interstitial lung dz in the setting of mild DOE and mild hypoxia. Cards->no stress tesing indicated->primary prevention emphasized   CVA (cerebral vascular accident) (HCC) 11/2016   Pontine (vertebrobasilar--imaging neg), TPA given.  Pt discharged on plavix .  Carotid dopplers ok, echocardiogram ok.  Residual deficit: vertigo but this improved greatly with therapy.   DOE (dyspnea on exertion) 2020   DOE and ? hypoxia->CXR 08/29/19--->diffuse interstitial opacities-? acute inflammatory process->Dr. Darlean eval'd him and was underwhelmed by the CXR and exam->CT chest 09/26/19 Very subtle areas of mild ground-glass attenuatio-nonspecific, mild air trapping. Per Dr. Darlean, no signif ILD, improving with inc ambulation (post-inflamm pulm fibrosis?)   Elevated PSA    12/2022, mild, referred to urology   Esophagitis 06/14/2020    EGD->+distal esophagitis, h pylori neg, mild distal esoph stenosis widened with forceps, benign gastric polyps.   GERD (gastroesophageal reflux disease)    Gout    History of adenomatous polyp of colon 2018   Recall 5 yrs   Hyperlipidemia    Hypertension    Hypothyroidism    Lumbar spondylosis    Ptosis due to aging    Right   Rectal bleeding 09/2017   Admitted for obs due to Hb drop.  GI consulted---obs recommended.  No transfusion required.  Plavix  was d/c'd, ferrous sulfate  recommended.  Outpt GI f/u--plavix  restarted and upper endoscopy was normal and colonoscopy showed 2 polyps and severe diverticular dz. Iron d/c'd 04/24/19.   Renal cyst    per pt he got MRI kidneys at [facility] 12/2020 and the images will be viewed by Dr. Clayborn   Restless legs syndrome    S/p cadaver renal transplant 2013   Secondary to HTN +lithium toxicity over 30 yrs caused kidney destruction [facility] transplant MDs)   Seborrheic dermatitis    eyebrows worst: Hytone  rx'd by Dr. Livingston.   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   left jawline(CX35FU), Left forehead (CX35FU) right shoulder (CX35FU), R forearm   Swallowing dysfunction    thin liquids (swallowing eval 01/2023)   Past Surgical History:  Procedure Laterality Date   AV FISTULA PLACEMENT  04/08/2012   Procedure: ARTERIOVENOUS (AV) FISTULA  CREATION;  Surgeon: Lonni GORMAN Blade, MD;  Location: [facility] OR;  Service: Vascular;  Laterality: Left;  Creation of left brachial cephalic arteriovenous fistula   BIOPSY  06/14/2020   Procedure: BIOPSY;  Surgeon: San Sandor GAILS, DO;  Location: [facility];  Service: Gastroenterology;;   CATARACT EXTRACTION, BILATERAL  05/2018   CHOLECYSTECTOMY N/A 08/18/2021   Procedure: LAPAROSCOPIC CHOLECYSTECTOMY WITH CHOLANGIOGRAM;  Surgeon: Eletha Boas, MD;  Location: [facility] ORS;  Service: General;  Laterality: N/A;   COLONOSCOPY  2013; 11/24/17   2013; Normal except diverticulosis (recall 10 yrs).  2018  (for GI bleeding)--severe diverticular dz + 2 adenomatous polyps.  Recall 5 yrs.   DG CHEST AP OR PA ([facility] HX)  06/03/2020   Bibasilar atelectasis. No focal consoliadation definitively identified.   dilation for GERD     ESOPHAGOGASTRODUODENOSCOPY  11/24/2017   gastric polyps x 2, otherwise normal.   ESOPHAGOGASTRODUODENOSCOPY N/A 06/14/2020   +distal esophagitis, h pylori neg, mild distal esoph stenosis widened with forceps, benign gastric polypsProcedure: ESOPHAGOGASTRODUODENOSCOPY (EGD);  Surgeon: San Sandor GAILS, DO;  Location: [facility];  Service: Gastroenterology;  Laterality: N/A;  Diagnostic EGD as patietn last took Plavix  11am on 06/12/2020   KIDNEY TRANSPLANT  11/06/2012   [facility] (cadaveric)   LIGATION OF ARTERIOVENOUS  FISTULA Left 10/21/2021   Procedure: LIGATION OF LEFT ARTERIOVENOUS  FISTULA;  Surgeon: Blade Lonni GORMAN, MD;  Location: [facility] OR;  Service: Vascular;  Laterality: Left;   PROSTATE BIOPSY  2011   no malignancy   TRANSTHORACIC ECHOCARDIOGRAM  11/2016   Normal LV systolic fxn, EF 44-39%.  Grade I DD.  Mild aortic root dilatation, mild MV regurg.   Virtual colonoscopy     07/2023 NO POLYPS   Patient Active Problem List   Diagnosis Date Noted   Chronic cholecystitis without calculus 08/18/2021   Gallbladder sludge 07/22/2021   Abdominal pain, RUQ (right upper quadrant) 07/22/2021   RUQ abdominal pain 06/04/2021   Peptic stricture of esophagus    Emesis 06/13/2020   Weight loss 06/13/2020   Cough 06/13/2020   AKI (acute kidney injury) 06/13/2020   Metabolic acidosis 06/13/2020   Esophageal dysphagia    Postinflammatory pulmonary fibrosis (HCC) 08/30/2019   DOE (dyspnea on exertion) 08/29/2019   Overweight (BMI 25.0-29.9) 07/27/2019   GIB (gastrointestinal bleeding) 10/07/2017   IFG (impaired fasting glucose) 06/08/2017   Stroke (HCC) 11/30/2016   Essential hypertension 01/16/2015   S/p cadaver renal transplant 11/06/2012   Chronic kidney disease  (CKD), stage IV (severe) (HCC) 03/18/2012   BENIGN PROSTATIC HYPERTROPHY 04/18/2008   Extrinsic asthma 12/19/2007   PSA, INCREASED 12/09/2007   Hypothyroidism 08/12/2007   Bipolar disorder (HCC) 07/27/2007   Hyperlipidemia 07/07/2007   Gout 07/07/2007   Essential hypertension 07/07/2007   GERD 07/07/2007     PCP: Aleene Hockey  REFERRING PROVIDER: Aleene Hockey  REFERRING DIAG: Unstable gait   THERAPY DIAG:  Other abnormalities of gait and mobility  Rationale for Evaluation and Treatment: Rehabilitation   ONSET DATE:    SUBJECTIVE:   SUBJECTIVE STATEMENT: Pt with no new complaints.   Eval: Pt reports he Had stroke 5/6 years ago-, states decreased balance since then. Notices decreased balance in last several months. Fell 2 x in last 2 weeks. 1 walking at park, going up incline,  and once at home when standing up and turning from chair.  AD: does not use.  Does have cane and walker but does not use.  Gets dizzy when getting up and down at church,  but states not at other times. Thinks it is when he sings.  BP - states his meds make it low. Home: Lives with wife. Steps: has 1,  2 in step to get in, with no hand rail.  Driving: Yes, he drives wife who has bad vision.  Recent chest pain- found to be non cardiac- was coughing a lot.  Exercise: 1x/wk tries to walk at some of the parks. Usually goes for 20 min.  [facility] 1.5 yr [facility].  Denies SOB or dizziness with walking    PERTINENT HISTORY: Stroke, Kidney transplant, HTN,    PAIN:  Are you having pain? No   PRECAUTIONS: Fall  WEIGHT BEARING RESTRICTIONS: No  FALLS:  Has patient fallen in last 6 months? Yes. Number of falls 2   PLOF: Independent  PATIENT GOALS:  improve balance, decrease falls.   NEXT MD VISIT:   OBJECTIVE:   DIAGNOSTIC FINDINGS:   PATIENT SURVEYS:    COGNITION: Overall cognitive status: Within functional limits for tasks  assessed     SENSATION: WFL  EDEMA:  n/a    POSTURE:    No Significant postural limitations  PALPATION:   LOWER EXTREMITY ROM: Hips: WFL, Knees: WFL   LOWER EXTREMITY MMT:  MMT Left 12/25/24 Right  12/25/24  Hip flexion 4+ 4+  Hip extension    Hip abduction    Hip adduction    Hip internal rotation    Hip external rotation    Knee flexion 4+ 4+  Knee extension 4+ 4+  Ankle dorsiflexion    Ankle plantarflexion    Ankle inversion    Ankle eversion     (Blank rows = not tested)   LOWER EXTREMITY SPECIAL TESTS:     FUNCTIONAL TESTS:  Eval:  5 time sit to stand: 30.22 sec .                 12/07/24:  16.35 TUG:  15.13- used hands to rise from chair       12/14/24   TUG:  10.72 sec no UE support to rise    12/25/24: 5 time sit to stand:  14.72     GAIT: Distance walked: 150 Assistive device utilized: None Level of assistance: Complete Independence Comments: decreased balance, slow speed, likley would benefit from use of SPC   TODAY'S TREATMENT:                                                                                                                              DATE:   01/15/2025 Therapeutic Exercise: Aerobic: Supine:  SLR 4 x 5 on R (cueing for knee straight ) , 2 x 10 on L;  bridging x 15;  Seated:   Standing:  scap squeeze ( mild dizziness after, possibly from slight head/neck movement.) Stretches:  Neuromuscular Re-education: Marching x 20/slow  Toe taps on 6 in step x 25, light UE support (low step  height)  Lateral stepping with weight shifts x 10 bil Manual Therapy:  Therapeutic Activity:  HR x 20 hip abd 2 x 10 ,  LAQ 2.5lb x 20 bil;  Squats at mat table x 15 , cueing to slow down.  Step ups 6 in step x 10 bil, no UE support (CGA)    Therapeutic Exercise: Aerobic: Supine:   Seated:  Standing:   Stretches:  Neuromuscular Re-education: Marching x 20/slow  Weaving around cones x 6 Walking with head turns L/R x 5 Sit to  stand, with quick turn L  , R x 5 each Manual Therapy:  Therapeutic Activity:  hip abd 2 x 10 ,  Squats at mat table x 15 , cueing to slow down.  Stairs, up/down 5 steps with no hand rail, x 6 Step ups 6 in step x 10 bil; light 1 ue support  Self Care:   PATIENT EDUCATION:  Education details: updated and reviewed HEP Person educated: Patient Education method: Explanation, Demonstration, Tactile cues, Verbal cues, and Handouts Education comprehension: verbalized understanding, returned demonstration, verbal cues required, tactile cues required, and needs further education   HOME EXERCISE PROGRAM: Access Code: [email or web address]   ASSESSMENT:  CLINICAL IMPRESSION: Recommended use of cane when going out.   Pt progressing with ability for activities. He has leg weakness noted with fatigue at end of session. He requires cueing for increasing foot clearance/step height with walking. Will benefit from continued LE strength and dynamic balance.     Eval: Patient presents with primary complaint of  decreased balance. He has decreased sores on berg,and 5 time sit to stand. He will benefit from practice with head turns in standing and with walking, and work on dynamic balance, to improve safety with his outdoor walking.  Pt with decreased ability for full functional activities. Pt will  benefit from skilled PT to improve deficits and pain and to return to PLOF.   OBJECTIVE IMPAIRMENTS: Abnormal gait, decreased activity tolerance, decreased balance, decreased knowledge of use of DME, decreased mobility, decreased strength, impaired vision/preception, improper body mechanics, and pain.   ACTIVITY LIMITATIONS: bending, standing, squatting, stairs, transfers, and locomotion level  PARTICIPATION LIMITATIONS: meal prep, cleaning, shopping, and community activity  PERSONAL FACTORS: Time since onset of injury/illness/exacerbation and 1 comorbidity: stroke,  are also affecting patient's functional outcome.    REHAB POTENTIAL: Good  CLINICAL DECISION MAKING: Evolving/moderate complexity  EVALUATION COMPLEXITY: Moderate   GOALS: Goals reviewed with patient? Yes  SHORT TERM GOALS: Target date: 11/16/2024   Pt to be independent with initial HEP  Goal status: MET  2.  Pt to demo ability to rise from higher mat table, without use of UEs.   Goal status: MET    LONG TERM GOALS: Target date: 02/05/2025   Pt to be independent with final HEP  Goal status: In progress   2.  Pt to improve score on 5 time sit to stand by at least 10 sec.   Goal status: MET  3.  Pt to improve score on BERG by at least 5 points.   Goal status: MET  4.  Pt to demo ambulation on level surfaces to be [facility] , with dynamic movement, head turns and direction changes .  Goal status: In progress   5. Pt to demo safe ability to navigate at least 5 steps with 1 hand rail, to improve ability for community navigation.    Goal status: New 12/25/24      PLAN:  PT FREQUENCY: 1-2x/week  PT DURATION: 8 weeks  PLANNED INTERVENTIONS: Therapeutic exercises, Therapeutic activity, Neuromuscular re-education, Patient/Family education, Self Care, Joint mobilization, Joint manipulation, Stair training, Orthotic/Fit training, DME instructions, Aquatic Therapy, Dry Needling, Electrical stimulation, Cryotherapy, Moist heat, Taping, Ultrasound, Ionotophoresis 4mg /ml Dexamethasone , Manual therapy,  Vasopneumatic device, Traction, Spinal manipulation, Spinal mobilization,Balance training, Gait training,   PLAN FOR NEXT SESSION:  more challenged on R,   R hip flex strength/ SLR,  clams,  step ups (both feet), foot clearance with walking and stairs. dynamic balance. Stairs, * walking with head turns and direction changes,  around cones.  Weaving around cones x 6 Step up over cones x 6 Walking with head turns L/R x 5   Tinnie Don, PT, DPT 11:04 AM  01/15/25   "

## 2025-01-23 ENCOUNTER — Encounter: Admitting: Physical Therapy

## 2025-01-25 ENCOUNTER — Ambulatory Visit: Admitting: Physical Therapy

## 2025-01-25 ENCOUNTER — Encounter: Payer: Self-pay | Admitting: Physical Therapy

## 2025-01-25 DIAGNOSIS — R2689 Other abnormalities of gait and mobility: Secondary | ICD-10-CM | POA: Diagnosis not present

## 2025-04-10 ENCOUNTER — Ambulatory Visit: Admitting: Family Medicine

## 2025-04-16 ENCOUNTER — Ambulatory Visit: Admitting: Psychiatry

## 2025-07-11 ENCOUNTER — Encounter
